# Patient Record
Sex: Female | Born: 1950 | Race: White | Hispanic: No | Smoking: Never smoker
Health system: Southern US, Community
[De-identification: ages and names within clinical notes are randomized; demographics above are authoritative.]

## PROBLEM LIST (undated history)

## (undated) ENCOUNTER — Emergency Department (HOSPITAL_COMMUNITY): Admission: EM | Payer: PPO | Source: Home / Self Care

## (undated) DIAGNOSIS — E662 Morbid (severe) obesity with alveolar hypoventilation: Secondary | ICD-10-CM

## (undated) DIAGNOSIS — I272 Pulmonary hypertension, unspecified: Secondary | ICD-10-CM

## (undated) DIAGNOSIS — I639 Cerebral infarction, unspecified: Secondary | ICD-10-CM

## (undated) DIAGNOSIS — Q782 Osteopetrosis: Secondary | ICD-10-CM

## (undated) DIAGNOSIS — R600 Localized edema: Secondary | ICD-10-CM

## (undated) DIAGNOSIS — G709 Myoneural disorder, unspecified: Secondary | ICD-10-CM

## (undated) DIAGNOSIS — G4730 Sleep apnea, unspecified: Secondary | ICD-10-CM

## (undated) DIAGNOSIS — K219 Gastro-esophageal reflux disease without esophagitis: Secondary | ICD-10-CM

## (undated) DIAGNOSIS — I482 Chronic atrial fibrillation, unspecified: Secondary | ICD-10-CM

## (undated) DIAGNOSIS — I10 Essential (primary) hypertension: Secondary | ICD-10-CM

## (undated) DIAGNOSIS — N950 Postmenopausal bleeding: Secondary | ICD-10-CM

## (undated) DIAGNOSIS — M8430XA Stress fracture, unspecified site, initial encounter for fracture: Secondary | ICD-10-CM

## (undated) DIAGNOSIS — K635 Polyp of colon: Secondary | ICD-10-CM

## (undated) DIAGNOSIS — IMO0001 Reserved for inherently not codable concepts without codable children: Secondary | ICD-10-CM

## (undated) DIAGNOSIS — R29898 Other symptoms and signs involving the musculoskeletal system: Secondary | ICD-10-CM

## (undated) DIAGNOSIS — C801 Malignant (primary) neoplasm, unspecified: Secondary | ICD-10-CM

## (undated) DIAGNOSIS — Z96619 Presence of unspecified artificial shoulder joint: Secondary | ICD-10-CM

## (undated) DIAGNOSIS — M1990 Unspecified osteoarthritis, unspecified site: Secondary | ICD-10-CM

## (undated) DIAGNOSIS — M779 Enthesopathy, unspecified: Secondary | ICD-10-CM

## (undated) DIAGNOSIS — Z96659 Presence of unspecified artificial knee joint: Secondary | ICD-10-CM

## (undated) DIAGNOSIS — E538 Deficiency of other specified B group vitamins: Secondary | ICD-10-CM

## (undated) DIAGNOSIS — Z5189 Encounter for other specified aftercare: Secondary | ICD-10-CM

## (undated) DIAGNOSIS — I4891 Unspecified atrial fibrillation: Secondary | ICD-10-CM

## (undated) DIAGNOSIS — I499 Cardiac arrhythmia, unspecified: Secondary | ICD-10-CM

## (undated) DIAGNOSIS — E039 Hypothyroidism, unspecified: Secondary | ICD-10-CM

## (undated) DIAGNOSIS — K5790 Diverticulosis of intestine, part unspecified, without perforation or abscess without bleeding: Secondary | ICD-10-CM

## (undated) DIAGNOSIS — I119 Hypertensive heart disease without heart failure: Secondary | ICD-10-CM

## (undated) DIAGNOSIS — E785 Hyperlipidemia, unspecified: Secondary | ICD-10-CM

## (undated) DIAGNOSIS — I509 Heart failure, unspecified: Secondary | ICD-10-CM

## (undated) HISTORY — DX: Myoneural disorder, unspecified: G70.9

## (undated) HISTORY — PX: TOTAL KNEE ARTHROPLASTY: SHX125

## (undated) HISTORY — PX: NOSE SURGERY: SHX723

## (undated) HISTORY — DX: Deficiency of other specified B group vitamins: E53.8

## (undated) HISTORY — PX: COLONOSCOPY W/ BIOPSIES AND POLYPECTOMY: SHX1376

## (undated) HISTORY — DX: Polyp of colon: K63.5

## (undated) HISTORY — PX: TONSILLECTOMY: SUR1361

## (undated) HISTORY — DX: Postmenopausal bleeding: N95.0

## (undated) HISTORY — DX: Localized edema: R60.0

## (undated) HISTORY — DX: Enthesopathy, unspecified: M77.9

## (undated) HISTORY — DX: Stress fracture, unspecified site, initial encounter for fracture: M84.30XA

## (undated) HISTORY — DX: Cerebral infarction, unspecified: I63.9

## (undated) HISTORY — DX: Diverticulosis of intestine, part unspecified, without perforation or abscess without bleeding: K57.90

## (undated) HISTORY — DX: Hypothyroidism, unspecified: E03.9

## (undated) HISTORY — PX: DILATION AND CURETTAGE OF UTERUS: SHX78

## (undated) HISTORY — DX: Osteopetrosis: Q78.2

---
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
## 1998-09-08 ENCOUNTER — Other Ambulatory Visit: Admission: RE | Admit: 1998-09-08 | Discharge: 1998-09-08 | Payer: Self-pay | Admitting: Family Medicine

## 2000-01-26 ENCOUNTER — Ambulatory Visit (HOSPITAL_COMMUNITY): Admission: RE | Admit: 2000-01-26 | Discharge: 2000-01-26 | Payer: Self-pay | Admitting: Family Medicine

## 2000-01-26 ENCOUNTER — Encounter: Payer: Self-pay | Admitting: Family Medicine

## 2000-02-17 ENCOUNTER — Encounter: Payer: Self-pay | Admitting: Orthopedic Surgery

## 2000-02-23 ENCOUNTER — Inpatient Hospital Stay (HOSPITAL_COMMUNITY): Admission: RE | Admit: 2000-02-23 | Discharge: 2000-02-27 | Payer: Self-pay | Admitting: Orthopedic Surgery

## 2003-02-21 ENCOUNTER — Ambulatory Visit (HOSPITAL_COMMUNITY): Admission: RE | Admit: 2003-02-21 | Discharge: 2003-02-21 | Payer: Self-pay | Admitting: Gastroenterology

## 2003-02-21 ENCOUNTER — Encounter (INDEPENDENT_AMBULATORY_CARE_PROVIDER_SITE_OTHER): Payer: Self-pay | Admitting: Specialist

## 2003-03-12 ENCOUNTER — Ambulatory Visit (HOSPITAL_COMMUNITY): Admission: RE | Admit: 2003-03-12 | Discharge: 2003-03-12 | Payer: Self-pay | Admitting: Family Medicine

## 2003-03-12 ENCOUNTER — Encounter: Payer: Self-pay | Admitting: Family Medicine

## 2003-03-31 ENCOUNTER — Encounter: Payer: Self-pay | Admitting: Internal Medicine

## 2004-09-05 LAB — HM COLONOSCOPY

## 2004-12-02 ENCOUNTER — Other Ambulatory Visit: Admission: RE | Admit: 2004-12-02 | Discharge: 2004-12-02 | Payer: Self-pay | Admitting: Family Medicine

## 2005-03-04 ENCOUNTER — Ambulatory Visit (HOSPITAL_COMMUNITY): Admission: RE | Admit: 2005-03-04 | Discharge: 2005-03-04 | Payer: Self-pay | Admitting: Family Medicine

## 2006-03-02 ENCOUNTER — Other Ambulatory Visit: Admission: RE | Admit: 2006-03-02 | Discharge: 2006-03-02 | Payer: Self-pay | Admitting: Family Medicine

## 2006-03-05 ENCOUNTER — Encounter: Payer: Self-pay | Admitting: Internal Medicine

## 2006-03-05 LAB — CONVERTED CEMR LAB

## 2006-03-20 ENCOUNTER — Ambulatory Visit (HOSPITAL_COMMUNITY): Admission: RE | Admit: 2006-03-20 | Discharge: 2006-03-20 | Payer: Self-pay | Admitting: Family Medicine

## 2006-04-05 ENCOUNTER — Ambulatory Visit (HOSPITAL_COMMUNITY): Admission: RE | Admit: 2006-04-05 | Discharge: 2006-04-05 | Payer: Self-pay | Admitting: Family Medicine

## 2006-07-31 ENCOUNTER — Inpatient Hospital Stay (HOSPITAL_COMMUNITY): Admission: RE | Admit: 2006-07-31 | Discharge: 2006-08-03 | Payer: Self-pay | Admitting: Orthopedic Surgery

## 2007-01-23 ENCOUNTER — Ambulatory Visit: Payer: Self-pay | Admitting: Internal Medicine

## 2007-02-28 ENCOUNTER — Encounter: Payer: Self-pay | Admitting: Internal Medicine

## 2007-02-28 DIAGNOSIS — E039 Hypothyroidism, unspecified: Secondary | ICD-10-CM | POA: Insufficient documentation

## 2007-04-02 ENCOUNTER — Ambulatory Visit: Payer: Self-pay | Admitting: Internal Medicine

## 2007-04-02 LAB — CONVERTED CEMR LAB
Albumin: 3.5 g/dL (ref 3.5–5.2)
Alkaline Phosphatase: 90 units/L (ref 39–117)
Blood in Urine, dipstick: NEGATIVE
CO2: 31 meq/L (ref 19–32)
Chloride: 103 meq/L (ref 96–112)
Cholesterol: 193 mg/dL (ref 0–200)
Creatinine, Ser: 0.9 mg/dL (ref 0.4–1.2)
GFR calc Af Amer: 83 mL/min
GFR calc non Af Amer: 69 mL/min
HDL: 35.2 mg/dL — ABNORMAL LOW (ref >39.0)
Hemoglobin: 10.2 g/dL — ABNORMAL LOW (ref 12.0–15.0)
Ketones, urine, test strip: NEGATIVE
MCV: 77.2 fL — ABNORMAL LOW (ref 78.0–100.0)
Monocytes Absolute: 0.6 10*3/uL (ref 0.2–0.7)
Monocytes Relative: 7.5 % (ref 3.0–11.0)
Potassium: 4.3 meq/L (ref 3.5–5.1)
Protein, U semiquant: NEGATIVE
RBC: 3.99 M/uL (ref 3.87–5.11)
RDW: 17.5 % — ABNORMAL HIGH (ref 11.5–14.6)
Sodium: 143 meq/L (ref 135–145)
Specific Gravity, Urine: 1.025
Urobilinogen, UA: 0.2
VLDL: 23 mg/dL (ref 0–40)
WBC: 8.1 10*3/uL (ref 4.5–10.5)

## 2007-04-09 ENCOUNTER — Encounter: Payer: Self-pay | Admitting: Internal Medicine

## 2007-04-09 ENCOUNTER — Other Ambulatory Visit: Admission: RE | Admit: 2007-04-09 | Discharge: 2007-04-09 | Payer: Self-pay | Admitting: Internal Medicine

## 2007-04-09 ENCOUNTER — Ambulatory Visit: Payer: Self-pay | Admitting: Internal Medicine

## 2007-04-09 DIAGNOSIS — E785 Hyperlipidemia, unspecified: Secondary | ICD-10-CM | POA: Insufficient documentation

## 2007-04-25 ENCOUNTER — Telehealth: Payer: Self-pay | Admitting: *Deleted

## 2007-04-25 LAB — CONVERTED CEMR LAB
Basophils Absolute: 0 10*3/uL (ref 0.0–0.1)
Basophils Relative: 0.4 % (ref 0.0–1.0)
Eosinophils Absolute: 0.4 10*3/uL (ref 0.0–0.6)
Eosinophils Relative: 3.9 % (ref 0.0–5.0)
Ferritin: 15.4 ng/mL (ref 10.0–291.0)
Folate: 3.9 ng/mL
HCT: 31.6 % — ABNORMAL LOW (ref 36.0–46.0)
Hemoglobin: 10.6 g/dL — ABNORMAL LOW (ref 12.0–15.0)
Lymphocytes Relative: 14.7 % (ref 12.0–46.0)
MCHC: 33.4 g/dL (ref 30.0–36.0)
MCV: 78 fL (ref 78.0–100.0)
Monocytes Absolute: 0.6 10*3/uL (ref 0.2–0.7)
Monocytes Relative: 6.2 % (ref 3.0–11.0)
Neutro Abs: 6.8 10*3/uL (ref 1.4–7.7)
Neutrophils Relative %: 74.8 % (ref 43.0–77.0)
Platelets: 289 10*3/uL (ref 150–400)
RBC: 4.05 M/uL (ref 3.87–5.11)
RDW: 17.3 % — ABNORMAL HIGH (ref 11.5–14.6)
Vitamin B-12: 148 pg/mL — ABNORMAL LOW (ref 211–911)
WBC: 9.1 10*3/uL (ref 4.5–10.5)

## 2007-05-08 ENCOUNTER — Ambulatory Visit: Payer: Self-pay | Admitting: Internal Medicine

## 2007-06-06 ENCOUNTER — Ambulatory Visit: Payer: Self-pay | Admitting: Internal Medicine

## 2007-06-06 LAB — CONVERTED CEMR LAB
AST: 21 units/L (ref 0–37)
Albumin: 3.6 g/dL (ref 3.5–5.2)
BUN: 20 mg/dL (ref 6–23)
Bilirubin, Direct: 0.1 mg/dL (ref 0.0–0.3)
Eosinophils Absolute: 0.2 10*3/uL (ref 0.0–0.6)
Eosinophils Relative: 3.1 % (ref 0.0–5.0)
GFR calc non Af Amer: 69 mL/min
Hgb A1c MFr Bld: 6.3 % — ABNORMAL HIGH (ref 4.6–6.0)
Monocytes Absolute: 0.3 10*3/uL (ref 0.2–0.7)
Monocytes Relative: 3.9 % (ref 3.0–11.0)
Neutro Abs: 5.7 10*3/uL (ref 1.4–7.7)
Neutrophils Relative %: 75.8 % (ref 43.0–77.0)
RBC: 4.15 M/uL (ref 3.87–5.11)
RDW: 19.4 % — ABNORMAL HIGH (ref 11.5–14.6)
Total Bilirubin: 0.6 mg/dL (ref 0.3–1.2)
Total Protein: 6.2 g/dL (ref 6.0–8.3)
Vitamin B-12: 225 pg/mL (ref 211–911)

## 2007-06-13 ENCOUNTER — Telehealth: Payer: Self-pay | Admitting: Family Medicine

## 2007-06-18 ENCOUNTER — Ambulatory Visit: Payer: Self-pay | Admitting: Internal Medicine

## 2007-06-27 ENCOUNTER — Ambulatory Visit: Payer: Self-pay | Admitting: Internal Medicine

## 2007-07-04 ENCOUNTER — Telehealth (INDEPENDENT_AMBULATORY_CARE_PROVIDER_SITE_OTHER): Payer: Self-pay | Admitting: *Deleted

## 2007-07-04 ENCOUNTER — Ambulatory Visit: Payer: Self-pay | Admitting: Internal Medicine

## 2007-07-11 ENCOUNTER — Ambulatory Visit: Payer: Self-pay | Admitting: Internal Medicine

## 2007-07-18 ENCOUNTER — Ambulatory Visit: Payer: Self-pay | Admitting: Internal Medicine

## 2007-08-01 ENCOUNTER — Ambulatory Visit: Payer: Self-pay | Admitting: Internal Medicine

## 2007-08-01 ENCOUNTER — Telehealth: Payer: Self-pay | Admitting: Internal Medicine

## 2007-08-08 ENCOUNTER — Ambulatory Visit: Payer: Self-pay | Admitting: Internal Medicine

## 2007-08-13 ENCOUNTER — Ambulatory Visit: Payer: Self-pay | Admitting: Internal Medicine

## 2007-08-16 LAB — CONVERTED CEMR LAB
ALT: 15 units/L (ref 0–35)
Alkaline Phosphatase: 82 units/L (ref 39–117)
Bilirubin, Direct: 0.2 mg/dL (ref 0.0–0.3)
Total Bilirubin: 0.6 mg/dL (ref 0.3–1.2)
Total Protein: 6.5 g/dL (ref 6.0–8.3)
Vitamin B-12: 644 pg/mL (ref 211–911)

## 2007-08-22 ENCOUNTER — Ambulatory Visit: Payer: Self-pay | Admitting: Internal Medicine

## 2007-08-22 LAB — CONVERTED CEMR LAB: Hemoglobin: 13.4 g/dL

## 2007-09-14 ENCOUNTER — Telehealth: Payer: Self-pay | Admitting: Internal Medicine

## 2007-09-19 ENCOUNTER — Ambulatory Visit: Payer: Self-pay | Admitting: Internal Medicine

## 2007-10-19 ENCOUNTER — Ambulatory Visit: Payer: Self-pay | Admitting: Internal Medicine

## 2007-11-07 ENCOUNTER — Ambulatory Visit: Payer: Self-pay | Admitting: Internal Medicine

## 2007-11-07 DIAGNOSIS — N950 Postmenopausal bleeding: Secondary | ICD-10-CM | POA: Insufficient documentation

## 2007-11-07 LAB — CONVERTED CEMR LAB
Glucose, Urine, Semiquant: NEGATIVE
pH: 5.5

## 2007-11-08 ENCOUNTER — Telehealth: Payer: Self-pay | Admitting: Internal Medicine

## 2007-11-08 ENCOUNTER — Encounter: Payer: Self-pay | Admitting: Internal Medicine

## 2007-11-13 ENCOUNTER — Other Ambulatory Visit: Admission: RE | Admit: 2007-11-13 | Discharge: 2007-11-13 | Payer: Self-pay | Admitting: Obstetrics & Gynecology

## 2007-11-14 ENCOUNTER — Ambulatory Visit: Payer: Self-pay | Admitting: Internal Medicine

## 2007-11-20 ENCOUNTER — Encounter: Payer: Self-pay | Admitting: Internal Medicine

## 2007-11-28 ENCOUNTER — Ambulatory Visit: Payer: Self-pay | Admitting: Internal Medicine

## 2007-12-02 LAB — CONVERTED CEMR LAB
Basophils Absolute: 0.1 10*3/uL (ref 0.0–0.1)
Eosinophils Relative: 3.5 % (ref 0.0–5.0)
HCT: 37.6 % (ref 36.0–46.0)
Hemoglobin: 12 g/dL (ref 12.0–15.0)
Hgb A1c MFr Bld: 6.2 % — ABNORMAL HIGH (ref 4.6–6.0)
MCHC: 32 g/dL (ref 30.0–36.0)
Monocytes Relative: 6.5 % (ref 3.0–11.0)
Neutrophils Relative %: 72.4 % (ref 43.0–77.0)
RBC: 4.14 M/uL (ref 3.87–5.11)
Vitamin B-12: 522 pg/mL (ref 211–911)
WBC: 7.2 10*3/uL (ref 4.5–10.5)

## 2007-12-10 ENCOUNTER — Ambulatory Visit: Payer: Self-pay | Admitting: Internal Medicine

## 2007-12-10 LAB — CONVERTED CEMR LAB
Cholesterol, target level: 200 mg/dL
HDL goal, serum: 40 mg/dL
LDL Goal: 130 mg/dL

## 2008-01-14 ENCOUNTER — Ambulatory Visit: Payer: Self-pay | Admitting: Internal Medicine

## 2008-02-11 ENCOUNTER — Encounter: Admission: RE | Admit: 2008-02-11 | Discharge: 2008-02-11 | Payer: Self-pay | Admitting: Orthopedic Surgery

## 2008-02-15 ENCOUNTER — Encounter: Admission: RE | Admit: 2008-02-15 | Discharge: 2008-02-15 | Payer: Self-pay | Admitting: Orthopedic Surgery

## 2008-02-19 ENCOUNTER — Telehealth: Payer: Self-pay | Admitting: Internal Medicine

## 2008-02-27 ENCOUNTER — Ambulatory Visit (HOSPITAL_COMMUNITY): Admission: RE | Admit: 2008-02-27 | Discharge: 2008-02-27 | Payer: Self-pay | Admitting: Internal Medicine

## 2008-03-02 ENCOUNTER — Emergency Department (HOSPITAL_COMMUNITY): Admission: EM | Admit: 2008-03-02 | Discharge: 2008-03-02 | Payer: Self-pay | Admitting: Family Medicine

## 2008-03-03 ENCOUNTER — Ambulatory Visit: Payer: Self-pay | Admitting: Internal Medicine

## 2008-03-31 ENCOUNTER — Encounter: Payer: Self-pay | Admitting: Obstetrics & Gynecology

## 2008-03-31 ENCOUNTER — Ambulatory Visit: Payer: Self-pay | Admitting: Internal Medicine

## 2008-03-31 ENCOUNTER — Ambulatory Visit (HOSPITAL_COMMUNITY): Admission: RE | Admit: 2008-03-31 | Discharge: 2008-03-31 | Payer: Self-pay | Admitting: Obstetrics & Gynecology

## 2008-04-14 ENCOUNTER — Telehealth: Payer: Self-pay | Admitting: Internal Medicine

## 2008-04-18 ENCOUNTER — Ambulatory Visit: Payer: Self-pay | Admitting: Internal Medicine

## 2008-04-18 LAB — CONVERTED CEMR LAB
ALT: 16 units/L (ref 0–35)
BUN: 15 mg/dL (ref 6–23)
Basophils Absolute: 0 10*3/uL (ref 0.0–0.1)
Bilirubin Urine: NEGATIVE
Bilirubin, Direct: 0.1 mg/dL (ref 0.0–0.3)
Blood in Urine, dipstick: NEGATIVE
Calcium: 9.1 mg/dL (ref 8.4–10.5)
Chloride: 102 meq/L (ref 96–112)
Creatinine, Ser: 1 mg/dL (ref 0.4–1.2)
GFR calc Af Amer: 73 mL/min
GFR calc non Af Amer: 61 mL/min
HCT: 37.8 % (ref 36.0–46.0)
Ketones, urine, test strip: NEGATIVE
LDL Cholesterol: 123 mg/dL — ABNORMAL HIGH (ref 0–99)
MCHC: 33.4 g/dL (ref 30.0–36.0)
MCV: 91.6 fL (ref 78.0–100.0)
Neutrophils Relative %: 74.4 % (ref 43.0–77.0)
Nitrite: NEGATIVE
Platelets: 220 10*3/uL (ref 150–400)
Potassium: 4 meq/L (ref 3.5–5.1)
Protein, U semiquant: NEGATIVE
RBC: 4.13 M/uL (ref 3.87–5.11)
RDW: 13.9 % (ref 11.5–14.6)
Sodium: 142 meq/L (ref 135–145)
Specific Gravity, Urine: 1.02
TSH: 2.18 microintl units/mL (ref 0.35–5.50)
Total CHOL/HDL Ratio: 5.1
Triglycerides: 182 mg/dL — ABNORMAL HIGH (ref 0–149)
WBC: 9.2 10*3/uL (ref 4.5–10.5)

## 2008-04-23 ENCOUNTER — Ambulatory Visit (HOSPITAL_COMMUNITY): Admission: RE | Admit: 2008-04-23 | Discharge: 2008-04-23 | Payer: Self-pay | Admitting: Orthopedic Surgery

## 2008-05-01 ENCOUNTER — Telehealth: Payer: Self-pay | Admitting: Internal Medicine

## 2008-05-05 ENCOUNTER — Ambulatory Visit: Payer: Self-pay | Admitting: Internal Medicine

## 2008-06-02 ENCOUNTER — Ambulatory Visit: Payer: Self-pay | Admitting: Internal Medicine

## 2008-06-28 ENCOUNTER — Encounter: Payer: Self-pay | Admitting: Internal Medicine

## 2008-06-30 ENCOUNTER — Ambulatory Visit: Payer: Self-pay | Admitting: Internal Medicine

## 2008-07-07 ENCOUNTER — Ambulatory Visit: Payer: Self-pay | Admitting: Internal Medicine

## 2008-07-07 ENCOUNTER — Telehealth: Payer: Self-pay | Admitting: Internal Medicine

## 2008-07-07 DIAGNOSIS — N390 Urinary tract infection, site not specified: Secondary | ICD-10-CM | POA: Insufficient documentation

## 2008-07-07 LAB — CONVERTED CEMR LAB
Specific Gravity, Urine: <1.005
Urobilinogen, UA: >=8
pH: 5

## 2008-07-08 ENCOUNTER — Encounter: Payer: Self-pay | Admitting: Internal Medicine

## 2008-07-15 ENCOUNTER — Telehealth: Payer: Self-pay | Admitting: Internal Medicine

## 2008-07-21 ENCOUNTER — Inpatient Hospital Stay (HOSPITAL_COMMUNITY): Admission: RE | Admit: 2008-07-21 | Discharge: 2008-07-24 | Payer: Self-pay | Admitting: Orthopedic Surgery

## 2008-09-08 ENCOUNTER — Ambulatory Visit: Payer: Self-pay | Admitting: Internal Medicine

## 2008-10-15 ENCOUNTER — Ambulatory Visit: Payer: Self-pay | Admitting: Internal Medicine

## 2008-10-15 DIAGNOSIS — J069 Acute upper respiratory infection, unspecified: Secondary | ICD-10-CM | POA: Insufficient documentation

## 2008-10-21 ENCOUNTER — Telehealth: Payer: Self-pay | Admitting: Internal Medicine

## 2008-11-10 ENCOUNTER — Telehealth: Payer: Self-pay | Admitting: *Deleted

## 2008-11-13 ENCOUNTER — Telehealth: Payer: Self-pay | Admitting: Internal Medicine

## 2008-11-17 ENCOUNTER — Ambulatory Visit: Payer: Self-pay | Admitting: Internal Medicine

## 2008-11-28 ENCOUNTER — Encounter: Admission: RE | Admit: 2008-11-28 | Discharge: 2008-11-28 | Payer: Self-pay | Admitting: Orthopedic Surgery

## 2008-12-03 ENCOUNTER — Telehealth: Payer: Self-pay | Admitting: Internal Medicine

## 2008-12-15 ENCOUNTER — Other Ambulatory Visit: Admission: RE | Admit: 2008-12-15 | Discharge: 2008-12-15 | Payer: Self-pay | Admitting: Obstetrics & Gynecology

## 2008-12-15 ENCOUNTER — Encounter: Payer: Self-pay | Admitting: Internal Medicine

## 2008-12-18 ENCOUNTER — Telehealth: Payer: Self-pay | Admitting: Internal Medicine

## 2009-01-26 ENCOUNTER — Ambulatory Visit: Payer: Self-pay | Admitting: Internal Medicine

## 2009-02-16 ENCOUNTER — Telehealth: Payer: Self-pay | Admitting: Family Medicine

## 2009-02-23 ENCOUNTER — Telehealth: Payer: Self-pay | Admitting: *Deleted

## 2009-02-24 ENCOUNTER — Telehealth: Payer: Self-pay | Admitting: *Deleted

## 2009-03-17 ENCOUNTER — Telehealth: Payer: Self-pay | Admitting: *Deleted

## 2009-04-15 ENCOUNTER — Telehealth: Payer: Self-pay | Admitting: *Deleted

## 2009-05-04 ENCOUNTER — Ambulatory Visit: Payer: Self-pay | Admitting: Internal Medicine

## 2009-05-04 LAB — CONVERTED CEMR LAB
ALT: 14 units/L (ref 0–35)
AST: 22 units/L (ref 0–37)
Albumin: 3.7 g/dL (ref 3.5–5.2)
Alkaline Phosphatase: 77 units/L (ref 39–117)
BUN: 26 mg/dL — ABNORMAL HIGH (ref 6–23)
Basophils Absolute: 0.1 10*3/uL (ref 0.0–0.1)
Basophils Relative: 0.9 % (ref 0.0–3.0)
CO2: 32 meq/L (ref 19–32)
Calcium: 9.1 mg/dL (ref 8.4–10.5)
Chloride: 102 meq/L (ref 96–112)
Creatinine, Ser: 1 mg/dL (ref 0.4–1.2)
Eosinophils Absolute: 0.2 10*3/uL (ref 0.0–0.7)
HCT: 34.7 % — ABNORMAL LOW (ref 36.0–46.0)
HDL: 39 mg/dL — ABNORMAL LOW (ref >39.00)
Lymphocytes Relative: 15.8 % (ref 12.0–46.0)
Lymphs Abs: 1.2 10*3/uL (ref 0.7–4.0)
MCV: 85.6 fL (ref 78.0–100.0)
Neutro Abs: 5.4 10*3/uL (ref 1.4–7.7)
Neutrophils Relative %: 74.2 % (ref 43.0–77.0)
Platelets: 231 10*3/uL (ref 150.0–400.0)
Potassium: 4 meq/L (ref 3.5–5.1)
Sodium: 140 meq/L (ref 135–145)
TSH: 1.96 microintl units/mL (ref 0.35–5.50)
Total Protein: 7 g/dL (ref 6.0–8.3)
Triglycerides: 154 mg/dL — ABNORMAL HIGH (ref 0.0–149.0)
VLDL: 30.8 mg/dL (ref 0.0–40.0)
WBC: 7.3 10*3/uL (ref 4.5–10.5)

## 2009-05-12 ENCOUNTER — Ambulatory Visit: Payer: Self-pay | Admitting: Internal Medicine

## 2009-06-22 ENCOUNTER — Telehealth: Payer: Self-pay | Admitting: Internal Medicine

## 2009-06-24 ENCOUNTER — Telehealth: Payer: Self-pay | Admitting: Internal Medicine

## 2009-06-24 ENCOUNTER — Ambulatory Visit: Payer: Self-pay | Admitting: Internal Medicine

## 2009-07-09 ENCOUNTER — Ambulatory Visit: Payer: Self-pay | Admitting: Internal Medicine

## 2009-07-09 LAB — CONVERTED CEMR LAB
Basophils Absolute: 0.1 10*3/uL (ref 0.0–0.1)
CO2: 32 meq/L (ref 19–32)
Chloride: 99 meq/L (ref 96–112)
Creatinine, Ser: 0.9 mg/dL (ref 0.4–1.2)
Hemoglobin: 12.1 g/dL (ref 12.0–15.0)
Hgb A1c MFr Bld: 6.2 % (ref 4.6–6.5)
Lymphocytes Relative: 13.8 % (ref 12.0–46.0)
Lymphs Abs: 1 10*3/uL (ref 0.7–4.0)
MCHC: 33.9 g/dL (ref 30.0–36.0)
MCV: 89.2 fL (ref 78.0–100.0)
Microalb, Ur: 0.2 mg/dL (ref 0.0–1.9)
Monocytes Absolute: 0.5 10*3/uL (ref 0.1–1.0)
Monocytes Relative: 6.3 % (ref 3.0–12.0)
Platelets: 201 10*3/uL (ref 150.0–400.0)
Saturation Ratios: 13 % — ABNORMAL LOW (ref 20.0–50.0)
Sodium: 141 meq/L (ref 135–145)

## 2009-07-16 ENCOUNTER — Ambulatory Visit: Payer: Self-pay | Admitting: Internal Medicine

## 2009-09-22 ENCOUNTER — Telehealth: Payer: Self-pay | Admitting: *Deleted

## 2009-10-07 ENCOUNTER — Telehealth: Payer: Self-pay | Admitting: Internal Medicine

## 2009-10-22 ENCOUNTER — Ambulatory Visit: Payer: Self-pay | Admitting: Internal Medicine

## 2009-10-30 ENCOUNTER — Ambulatory Visit (HOSPITAL_COMMUNITY): Admission: RE | Admit: 2009-10-30 | Discharge: 2009-10-30 | Payer: Self-pay | Admitting: Obstetrics & Gynecology

## 2009-10-30 LAB — HM MAMMOGRAPHY

## 2009-11-05 ENCOUNTER — Encounter: Payer: Self-pay | Admitting: Internal Medicine

## 2009-12-02 ENCOUNTER — Inpatient Hospital Stay (HOSPITAL_COMMUNITY): Admission: RE | Admit: 2009-12-02 | Discharge: 2009-12-05 | Payer: Self-pay | Admitting: Orthopedic Surgery

## 2010-03-09 ENCOUNTER — Ambulatory Visit: Payer: Self-pay | Admitting: Internal Medicine

## 2010-04-22 ENCOUNTER — Ambulatory Visit: Payer: Self-pay | Admitting: Internal Medicine

## 2010-05-17 ENCOUNTER — Ambulatory Visit: Payer: Self-pay | Admitting: Internal Medicine

## 2010-05-17 DIAGNOSIS — M1990 Unspecified osteoarthritis, unspecified site: Secondary | ICD-10-CM | POA: Insufficient documentation

## 2010-05-26 ENCOUNTER — Telehealth: Payer: Self-pay | Admitting: Internal Medicine

## 2010-06-05 HISTORY — PX: OTHER SURGICAL HISTORY: SHX169

## 2010-06-11 ENCOUNTER — Telehealth: Payer: Self-pay | Admitting: *Deleted

## 2010-06-18 ENCOUNTER — Inpatient Hospital Stay (HOSPITAL_COMMUNITY): Admission: RE | Admit: 2010-06-18 | Discharge: 2010-06-19 | Payer: Self-pay | Admitting: Orthopedic Surgery

## 2010-06-30 ENCOUNTER — Ambulatory Visit: Payer: Self-pay | Admitting: Internal Medicine

## 2010-06-30 LAB — CONVERTED CEMR LAB
ALT: 13 units/L (ref 0–35)
AST: 19 units/L (ref 0–37)
Alkaline Phosphatase: 89 units/L (ref 39–117)
BUN: 19 mg/dL (ref 6–23)
Basophils Absolute: 0 10*3/uL (ref 0.0–0.1)
Bilirubin, Direct: 0.1 mg/dL (ref 0.0–0.3)
Blood in Urine, dipstick: NEGATIVE
Calcium: 8.8 mg/dL (ref 8.4–10.5)
Chloride: 98 meq/L (ref 96–112)
Creatinine, Ser: 0.9 mg/dL (ref 0.4–1.2)
GFR calc non Af Amer: 68.56 mL/min (ref >60)
HCT: 32.3 % — ABNORMAL LOW (ref 36.0–46.0)
Hemoglobin: 10.7 g/dL — ABNORMAL LOW (ref 12.0–15.0)
Ketones, urine, test strip: NEGATIVE
Lymphs Abs: 1.1 10*3/uL (ref 0.7–4.0)
Monocytes Absolute: 0.5 10*3/uL (ref 0.1–1.0)
Monocytes Relative: 5.8 % (ref 3.0–12.0)
Nitrite: NEGATIVE
RBC: 3.79 M/uL — ABNORMAL LOW (ref 3.87–5.11)
RDW: 17.2 % — ABNORMAL HIGH (ref 11.5–14.6)
Sodium: 136 meq/L (ref 135–145)
Specific Gravity, Urine: 1.02
Total CHOL/HDL Ratio: 4
Urobilinogen, UA: 0.2
WBC: 8.7 10*3/uL (ref 4.5–10.5)

## 2010-07-09 ENCOUNTER — Ambulatory Visit: Payer: Self-pay | Admitting: Internal Medicine

## 2010-07-16 ENCOUNTER — Encounter: Payer: Self-pay | Admitting: Internal Medicine

## 2010-09-10 ENCOUNTER — Telehealth: Payer: Self-pay | Admitting: *Deleted

## 2010-09-28 ENCOUNTER — Other Ambulatory Visit: Payer: Self-pay | Admitting: Internal Medicine

## 2010-09-28 ENCOUNTER — Ambulatory Visit
Admission: RE | Admit: 2010-09-28 | Discharge: 2010-09-28 | Payer: Self-pay | Source: Home / Self Care | Attending: Internal Medicine | Admitting: Internal Medicine

## 2010-10-05 ENCOUNTER — Ambulatory Visit
Admission: RE | Admit: 2010-10-05 | Discharge: 2010-10-05 | Payer: Self-pay | Source: Home / Self Care | Attending: Internal Medicine | Admitting: Internal Medicine

## 2010-10-05 ENCOUNTER — Other Ambulatory Visit: Payer: Self-pay | Admitting: Internal Medicine

## 2010-10-05 LAB — FERRITIN: Ferritin: 19 ng/mL (ref 10.0–291.0)

## 2010-10-05 LAB — CBC WITH DIFFERENTIAL/PLATELET
Basophils Absolute: 0 10*3/uL (ref 0.0–0.1)
Basophils Relative: 0.4 % (ref 0.0–3.0)
HCT: 31.3 % — ABNORMAL LOW (ref 36.0–46.0)
Hemoglobin: 10 g/dL — ABNORMAL LOW (ref 12.0–15.0)
Lymphs Abs: 1.1 10*3/uL (ref 0.7–4.0)
Monocytes Relative: 5.8 % (ref 3.0–12.0)
Neutro Abs: 6.5 10*3/uL (ref 1.4–7.7)
RDW: 20.3 % — ABNORMAL HIGH (ref 11.5–14.6)

## 2010-10-05 LAB — VITAMIN B12: Vitamin B-12: 296 pg/mL (ref 211–911)

## 2010-10-05 LAB — HEMOGLOBIN A1C: Hgb A1c MFr Bld: 6.8 % — ABNORMAL HIGH (ref 4.6–6.5)

## 2010-10-07 NOTE — Progress Notes (Signed)
Summary[facility]  Phone Note Call from Patient   Summary of Call: Renewal LisinoprillHCTZ & Synthroid not received by [facility].  Please resend.   Initial call taken by: Shelbie Hutching, RN,  May 26, 2010 9:26 AM    Prescriptions: SYNTHROID 75 MCG TABS (LEVOTHYROXINE SODIUM) Take 1 tablet by mouth once a day  #90 x 0   Entered by:   Shelbie Hutching, RN   Authorized by:   [name] MD   Signed by:   Shelbie Hutching, RN on 05/26/2010   Method used:   Electronically to        [facility] (mail-order)             , [place]         Ph: [phone]       Fax: [phone]   RxID:   [id number] LISINOPRIL-HYDROCHLOROTHIAZIDE 20-25 MG TABS (LISINOPRIL-HYDROCHLOROTHIAZIDE) 1 by mouth once daily  #90 x 0   Entered by:   Shelbie Hutching, RN   Authorized by:   [name] MD   Signed by:   Shelbie Hutching, RN on 05/26/2010   Method used:   Electronically to        [facility] (mail-order)             , [place]         Ph: [phone]       Fax: [phone]   RxID:   [id number]

## 2010-10-07 NOTE — Assessment & Plan Note (Signed)
Summary: B12 INJ // RS  Nurse Visit   Allergies: 1)  Benadryl (Diphenhydramine Hcl)  Medication Administration  Injection # 1:    Medication: Vit B12 1000 mcg    Diagnosis: ANEMIA, VITAMIN B12 DEFICIENCY NEC (ICD-281.1)    Route: IM    Site: R deltoid    Exp Date: 12/05/2011    Lot #: [id number]    Mfr: [place]    Patient tolerated injection without complications    Given by: Sherron Monday, [place] ([place]) (April 22, 2010 8:33 AM)  Orders Added: 1)  Vit B12 1000 mcg [J3420] 2)  Admin of Therapeutic Inj  intramuscular or subcutaneous [45809]

## 2010-10-07 NOTE — Progress Notes (Signed)
Summary: mailorder rx  Phone Note Refill Request Call back at Home Phone [phone]   Refills Requested: Medication #1:  ATENOLOL 50 MG TABS Take 1 tablet by mouth once a day  Medication #2:  LISINOPRIL-HYDROCHLOROTHIAZIDE 20-25 MG TABS 1 by mouth once daily  Medication #3:  SYNTHROID 75 MCG TABS Take 1 tablet by mouth once a day [facility] with 3 refills  Initial call taken by: Glo Herring,  September 10, 2010 9:22 AM  Follow-up for Phone Call         free T4, free t3   and  ipth  Rx sent for 90 days only. Follow-up by: Sherron Monday, CMA Deborra Medina),  September 10, 2010 9:31 AM  Additional Follow-up for Phone Call Additional follow up Details #1::        Pt aware of this and appt made. Additional Follow-up by: Sherron Monday, CMA (AAMA),  September 10, 2010 1:29 PM    Prescriptions: SYNTHROID 75 MCG TABS (LEVOTHYROXINE SODIUM) Take 1 tablet by mouth once a day  #90 x 0   Entered by:   Sherron Monday, CMA (AAMA)   Authorized by:   [name] MD   Signed by:   Sherron Monday, CMA (AAMA) on 09/10/2010   Method used:   Faxed to ...       [facility] ([facility]order)             , [place]         Ph: [phone]       Fax: [phone]   RxID:   [id number] LISINOPRIL-HYDROCHLOROTHIAZIDE 20-25 MG TABS (LISINOPRIL-HYDROCHLOROTHIAZIDE) 1 by mouth once daily  #90 x 0   Entered by:   Sherron Monday, CMA (AAMA)   Authorized by:   [name] MD   Signed by:   Sherron Monday, CMA (AAMA) on 09/10/2010   Method used:   Faxed to ...       [facility] (mail-order)             , [place]         Ph: [phone]       Fax: [phone]   RxID:   [id number] ATENOLOL 50 MG TABS (ATENOLOL) Take 1 tablet by mouth once a day  #90 x 0   Entered by:   Sherron Monday, CMA (AAMA)   Authorized by:   [name] MD   Signed by:   Sherron Monday, CMA (AAMA) on 09/10/2010   Method used:   Faxed to ...       [facility])             , [place]         Ph: [phone]       Fax: [phone]   RxID:   [id number]

## 2010-10-07 NOTE — Assessment & Plan Note (Signed)
Summary: CPX // RS   Vital Signs:  Patient profile:   60 year old female Menstrual status:  postmenopausal Height:      61.5 inches Weight:      326 pounds BMI:     60.82 Pulse rate:   78 / minute BP sitting:   160 / 80  (left arm) Cuff size:   large  Vitals Entered By: [name], CMA (AAMA) (July 09, 2010 8:34 AM) CC: CPX without pap- Pt has a gyn   History of Present Illness: [name]  comes in today  for preventive visit  Since last visit she has had her  right shoulder surgery  and is now 2 weeks post op and  and doing well with normal activiy so far with arm.   Back at work.  A bit tired but  thinks its from the  anesthesia and inactivity . Bp has been good at surgery.   and   no missed meds  Since the surgery  Taking iron every other day .    hg was low at surgery . Due for b12 shots. today No bleeding except had episode of vaginal  bleeding followed by Stacy [name] .     Preventive Care Screening  Pap Smear:    Date:  12/04/2009    Results:  normal   Prior Values:    Pap Smear:  normal (12/04/2008)    Mammogram:  ASSESSMENT: Negative - BI-RADS 1^MM DIGITAL SCREENING (10/30/2009)    Colonoscopy:  Done (09/05/2004)    Last Tetanus Booster:  Historical (09/05/2001)   Preventive Screening-Counseling & Management  Alcohol-Tobacco     Alcohol drinks/day: <1     Smoking Status: never  Caffeine-Diet-Exercise     Caffeine use/day: <1     Does Patient Exercise: yes     Type of exercise: bike     Times/week: 2  Hep-HIV-STD-Contraception     Dental Visit-last 6 months yes     Sun Exposure-Excessive: no  Safety-Violence-Falls     Seat Belt Use: yes     Firearms in the Home: no firearms in the home     Smoke Detectors: yes     Violence in the Home: no risk noted     Sexual Abuse: no  Current Medications (verified): 1)  Atenolol 50 Mg Tabs (Atenolol) .... Take 1 Tablet By Mouth Once A Day 2)  Hydrocodone-Acetaminophen 7.5-325 Mg Tabs  (Hydrocodone-Acetaminophen) .... Take As Directed 3)  Lisinopril-Hydrochlorothiazide 20-25 Mg Tabs (Lisinopril-Hydrochlorothiazide) .Marland Kitchen.. 1 By Mouth Once Daily 4)  Synthroid 75 Mcg Tabs (Levothyroxine Sodium) .... Take 1 Tablet By Mouth Once A Day 5)  Cyanocobalamin 1000 Mcg/ml Inj Soln (Cyanocobalamin) .Marland Kitchen.. 1 Cc  Q 4-6 Weeks 6)  Vitamin D 50000 Unit  Caps (Ergocalciferol) .Marland Kitchen.. 1 By Mouth Twice A Week. 7)  Cymbalta 30 Mg Cpep (Duloxetine Hcl) .Marland Kitchen.. 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      Review of Systems       The patient complains of dyspnea on exertion.  The patient denies anorexia, fever, weight loss, vision loss, decreased hearing, hoarseness, chest pain, syncope, prolonged cough, melena, hematochezia, severe indigestion/heartburn, transient blindness, abnormal bleeding, enlarged lymph nodes, and angioedema.         DOE  no change  felt from pain and  size.    had episode of vaginal bleeding and [name]  evaluated  .   post nsa;l drainage in am  claritin no help .  ? if flonase helped in the past . No numbness  weakness or falling.   Physical Exam  General:  Well-developed,well-nourished,in no acute distress; alert,appropriate and cooperative throughout examination Head:  normocephalic and atraumatic.   Eyes:  vision grossly intact.   Ears:  R ear normal, L ear normal, and no external deformities.   Nose:  no external deformity, no external erythema, and no nasal discharge.   Mouth:  good dentition and pharynx pink and moist.   Neck:  No deformities, masses, or tenderness noted. Breasts:  No mass, nodules, thickening, tenderness, bulging, retraction, inflamation, nipple discharge or skin changes noted.   Lungs:  Normal respiratory effort, chest expands symmetrically. Lungs are clear to auscultation, no crackles or wheezes. Heart:  normal rate, regular rhythm, no gallop, no rub, no JVD, and no lifts.  soft sem only at lusb no radiation to neck or elsewhere nl carotid pulses and no bruits Abdomen:  Bowel sounds positive,abdomen soft and non-tender without masses, organomegaly or noted. Msk:  healing scar right hsoulder good rom  no redness scars on both knees  Pulses:  pulses intact without delay   Extremities:  trace left pedal edema, 1+ left pedal edema, trace right pedal edema, and 1+ right pedal edema.  some vv no ulcers  Neurologic:  alert & oriented X3 and strength normal in all extremities.   Pt is A&Ox3,affect,speech,memory,attention,&motor skills appear intact.  Skin:  turgor normal, color normal, no petechiae, and no purpura.   Cervical Nodes:  No lymphadenopathy noted Axillary Nodes:  No palpable lymphadenopathy Inguinal Nodes:  No significant adenopathy Psych:  Normal eye contact, appropriate affect. Cognition appears normal.    Impression & Recommendations:  Problem # 1:  PREVENTIVE HEALTH CARE (ICD-V70.0) counseled    Problem # 2:  ANEMIA, VITAMIN B12 DEFICIENCY NEC (ICD-281.1) b12 but may have iron defic also  post op and hx of same  Her updated medication list for this  problem includes:    Cyanocobalamin 1000 Mcg/ml Inj Soln (Cyanocobalamin) .Marland Kitchen... 1 cc  q 4-6 weeks  Orders: Vit B12 1000 mcg (J3420) Admin of Therapeutic Inj  intramuscular or subcutaneous (30160)  Problem # 3:  HYPERLIPIDEMIA (ICD-272.4)  Labs Reviewed: SGOT: 19 (06/30/2010)   SGPT: 13 (06/30/2010)  Lipid  Goals: Chol Goal: 200 (12/10/2007)   HDL Goal: 40 (12/10/2007)   LDL Goal: 130 (12/10/2007)   TG Goal: 150 (12/10/2007)  Prior 10 Yr Risk Heart Disease: 11 % (07/16/2009)   HDL:50.30 (06/30/2010), 39.00 (05/04/2009)  LDL:116 (05/04/2009), 123 (04/18/2008)  Chol:221 (06/30/2010), 186 (05/04/2009)  Trig:165.0 (06/30/2010), 154.0 (05/04/2009)  Problem # 4:  FASTING HYPERGLYCEMIA (ICD-790.29)  fbs 110  at risk counseled   Labs Reviewed: Creat: 0.9 (06/30/2010)     Problem # 5:  HYPERTENSION (ICD-401.9) up today  but has been good feels  an outlier reading    will recheck at home  Her updated medication list for this problem includes:    Atenolol 50 Mg Tabs (Atenolol) .Marland Kitchen... Take 1 tablet by mouth once a day    Lisinopril-hydrochlorothiazide 20-25 Mg Tabs (Lisinopril-hydrochlorothiazide) .Marland Kitchen... 1 by mouth once daily  Problem # 6:  MORBID OBESITY (ICD-278.01) Assessment: Deteriorated some weight gain since last visit    counseled   poss weight watcher or other intervention  Problem # 7:  ARTHRITIS, RIGHT SHOULDER (ICD-716.91) post op doing well .   Problem # 8:  POSTNASAL DRIP (ICD-784.91) poss allergic can try allergra andflonase    Problem # 9:  ANEMIA (ICD-285.9)  prob combo  iron and b12      needss follow up   Problem # 10:  HYPOTHYROIDISM (ICD-244.9)  Her updated medication list for this problem includes:    Synthroid 75 Mcg Tabs (Levothyroxine sodium) .Marland Kitchen... Take 1 tablet by mouth once a day  Labs Reviewed: TSH: 1.62 (06/30/2010)    HgBA1c: 6.2 (07/09/2009) Chol: 221 (06/30/2010)   HDL: 50.30 (06/30/2010)   LDL: 116 (05/04/2009)   TG: 165.0 (06/30/2010)  Complete  Medication List: 1)  Atenolol 50 Mg Tabs (Atenolol) .... Take 1 tablet by mouth once a day 2)  Hydrocodone-acetaminophen 7.5-325 Mg Tabs (Hydrocodone-acetaminophen) .... Take as directed 3)  Lisinopril-hydrochlorothiazide 20-25 Mg Tabs (Lisinopril-hydrochlorothiazide) .Marland Kitchen.. 1 by mouth once daily 4)  Synthroid 75 Mcg Tabs (Levothyroxine sodium) .... Take 1 tablet by mouth once a day 5)  Cyanocobalamin 1000 Mcg/ml Inj Soln (Cyanocobalamin) .Marland Kitchen.. 1 cc  q 4-6 weeks 6)  Vitamin D 50000 Unit Caps (Ergocalciferol) .Marland Kitchen.. 1 by mouth twice a week. 7)  Cymbalta 30 Mg Cpep (Duloxetine hcl) .Marland Kitchen.. 1 by mouth once daily 8)  Omega 3 340 Mg Cpdr (Omega-3 fatty acids) 9)  Aspirin 81 Mg Tabs (Aspirin) 10)  Fluticasone Propionate 50 Mcg/act Susp (Fluticasone propionate) .... 2 sprays each nostril once daily  Patient Instructions: 1)  take iron every day   2)  limit sugars sweets  refined carbs 3)  losing weight will hlep most  pf your medical problems  4)  Monitor your BP to make sure  it is in control . call if not. 5)  Ok to try flonase for drainage . 6)  Recheck cbc, IBC B12 ferritin   Hg a1c in 2-3 months and then ROV  . Dx anemia , elevated sugars  Prescriptions: SYNTHROID 75 MCG TABS (LEVOTHYROXINE SODIUM) Take 1 tablet by mouth once a day  #30 x 1   Entered and Authorized by:   [name] MD   Signed by:   [name] MD on 07/09/2010   Method used:   Electronically to        [facility]  [phone]* (retail)       [address] [place], [place]  [address]  Ph: [phone] or [phone]       Fax: [phone]   RxID:   [id number] LISINOPRIL-HYDROCHLOROTHIAZIDE 20-25 MG TABS (LISINOPRIL-HYDROCHLOROTHIAZIDE) 1 by mouth once daily  #30 x 1   Entered and Authorized by:   [name] MD   Signed by:   [name] MD on 07/09/2010   Method used:   Electronically to        [facility]  [phone]* (retail)       [place], [place]  [address]        Ph: [phone] or [phone]       Fax: [phone]   RxID:   [id number] ATENOLOL 50 MG TABS (ATENOLOL) Take 1 tablet by mouth once a day  #30 x 1   Entered and Authorized by:   [name] MD   Signed by:   [name] MD on 07/09/2010   Method used:   Electronically to        [facility]  [phone]* (retail)       [place], [place]  [address]       Ph: [phone] or [phone]       Fax: [phone]   RxID:   [id number] SYNTHROID 75 MCG TABS (LEVOTHYROXINE SODIUM) Take 1 tablet by mouth once a day  #30 x 0   Entered and Authorized by:   [name] MD   Signed by:   [name] MD on 07/09/2010   Method used:   Electronically to        [facility]* (mail-order)             , [place]         Ph: [phone]       Fax: [phone]   RxID:   [id number] LISINOPRIL-HYDROCHLOROTHIAZIDE 20-25 MG TABS (LISINOPRIL-HYDROCHLOROTHIAZIDE) 1 by mouth once daily  #30 x 0   Entered and Authorized by:   [name] MD   Signed by:   [name] MD on 07/09/2010   Method used:   Electronically to        [facility]* (mail-order)             , [place]         Ph: [phone]       Fax: [phone]   RxID:   [id number] ATENOLOL 50 MG TABS (ATENOLOL) Take 1 tablet by mouth once a day  #90 x 0   Entered and Authorized by:   [name] MD   Signed by:   [name] MD on 07/09/2010   Method used:   Electronically to        [facility]* (mail-order)             , [place]         Ph: [phone]       Fax: [phone]   RxID:   [id number] FLUTICASONE PROPIONATE 50 MCG/ACT SUSP (FLUTICASONE PROPIONATE) 2 sprays each nostril once daily  #1 x 1   Entered and Authorized by:   [name] MD   Signed by:   [name] MD on 07/09/2010   Method used:   Electronically to        [facility]  [phone]* (retail)       [address] [place], [place]  [address]  Ph: [phone] or [phone]       Fax:  [phone]   RxID:   [id number]    Medication Administration  Injection # 1:    Medication: Vit B12 1000 mcg    Diagnosis: ANEMIA, VITAMIN B12 DEFICIENCY NEC (ICD-281.1)    Route: IM    Site: R deltoid    Exp Date: 03/05/2012    Lot #: [id number]    Mfr: [place]    Patient tolerated injection without complications    Given by: [name], CMA (AAMA) (July 09, 2010 9:23 AM)  Orders Added: 1)  Vit B12 1000 mcg [J3420] 2)  Admin of Therapeutic Inj  intramuscular or subcutaneous [96372] 3)  Est. Patient 40-64 years [99396] 4)  Est. Patient Level III [85027]     Appended Document: CPX // RS Recieved a message saying that rx's sent to [facility] didn't go thru. Rx's faxed electronically to [facility]. [name], CMA (AAMA)  July 09, 2010 2:32 PM    Clinical Lists Changes  Medications: Rx of ATENOLOL 50 MG TABS (ATENOLOL) Take 1 tablet by mouth once a day;  #90 x 0;  Signed;  Entered by: [name], CMA (AAMA);  Authorized by: [name] MD;  Method used: Faxed to [facility]*, , ,   , Ph: [phone], Fax: [phone] Rx of LISINOPRIL-HYDROCHLOROTHIAZIDE 20-25 MG TABS (LISINOPRIL-HYDROCHLOROTHIAZIDE) 1 by mouth once daily;  #90 x 0;  Signed;  Entered by: [name], CMA (AAMA);  Authorized by: [name] MD;  Method used: Faxed to [facility]*, , , [place]  , Ph: [phone], Fax: [phone] Rx of SYNTHROID 75 MCG TABS (LEVOTHYROXINE SODIUM) Take 1 tablet by mouth once a day;  #90 x 0;  Signed;  Entered by: [name], CMA (AAMA);  Authorized by: [name] MD;  Method used: Faxed to [facility]*, , , [place]  , Ph: [phone], Fax: [phone] Rx of FLUTICASONE PROPIONATE 50 MCG/ACT SUSP (FLUTICASONE PROPIONATE) 2 sprays each nostril once daily;  #3 x 0;  Signed;  Entered by: [name], CMA (AAMA);  Authorized by: [name] MD;  Method used: Faxed to [facility]*, , , [place]  , Ph: [phone],  Fax: [phone]    Prescriptions: FLUTICASONE PROPIONATE 50 MCG/ACT SUSP (FLUTICASONE PROPIONATE) 2 sprays each nostril once daily  #3 x 0   Entered by:   [name], CMA (AAMA)   Authorized by:   [name] MD   Signed by:   [name], CMA (AAMA) on 07/09/2010   Method used:   Faxed to ...       [facility]* (mail-order)             , [place]         Ph: [phone]       Fax: [phone]   RxID:   [id number] SYNTHROID 75 MCG TABS (LEVOTHYROXINE SODIUM) Take 1 tablet by mouth once a day  #90 x 0   Entered by:   [name], CMA (AAMA)   Authorized by:   [name] MD   Signed by:   [name], CMA (AAMA) on 07/09/2010   Method used:   Faxed to ...       [facility]* (mail-order)             , [place]         Ph: [phone]  Fax: [phone]   RxID:   [id number] LISINOPRIL-HYDROCHLOROTHIAZIDE 20-25 MG TABS (LISINOPRIL-HYDROCHLOROTHIAZIDE) 1 by mouth once daily  #90 x 0   Entered by:   [name], CMA ([place])   Authorized by:   [name] MD   Signed by:   [name], CMA (AAMA) on 07/09/2010   Method used:   Faxed to ...       [facility]* (mail-order)             , [place]         Ph: [phone]       Fax: [phone]   RxID:   [id number] ATENOLOL 50 MG TABS (ATENOLOL) Take 1 tablet by mouth once a day  #90 x 0   Entered by:   [name], CMA (AAMA)   Authorized by:   [name] MD   Signed by:   [name], CMA (AAMA) on 07/09/2010   Method used:   Faxed to ...       [facility]* (mail-order)             , [place]         Ph: [phone]       Fax: [phone]   RxID:   [id number]

## 2010-10-07 NOTE — Assessment & Plan Note (Signed)
Summary: B12 INJ // RS  Nurse Visit   Allergies: 1)  Benadryl (Diphenhydramine Hcl)  Medication Administration  Injection # 1:    Medication: Vit B12 1000 mcg    Diagnosis: ANEMIA, VITAMIN B12 DEFICIENCY NEC (ICD-281.1)    Route: IM    Site: L deltoid    Exp Date: 02/13    Lot #: [id number]    Mfr: [place]    Patient tolerated injection without complications    Given by: Chipper Oman, RN (March 09, 2010 8:17 AM)  Orders Added: 1)  Vit B12 1000 mcg [J3420] 2)  Admin of Therapeutic Inj  intramuscular or subcutaneous [62952]

## 2010-10-07 NOTE — Progress Notes (Signed)
Summary: note  Phone Note Call from Patient Call back at Home Phone [phone]   Summary of Call: Needs note that she is medically okay to have knee surgery in [place] to Dr. Maureen Ralphs fax [phone] and Larene [place] to call for any other details. Initial call taken by: Shelbie Hutching, RN,  October 07, 2009 12:11 PM  Follow-up for Phone Call        Last  ov was  october  and no recent EKG in the chart.  Can only say her medical problems are stable as of that time.  Other wise  Needs pre op OV  if pore info or assessment needed. Follow-up by: [name] MD,  October 12, 2009 2:55 PM  Additional Follow-up for Phone Call Additional follow up Details #1::        Spoke with pt and she would like [place] to send what we have and if they need more then she will schedule an office visit. Additional Follow-up by: Sherron Monday, CMA (AAMA),  October 12, 2009 3:54 PM

## 2010-10-07 NOTE — Medication Information (Signed)
Summary: Fluticasone Prop / [employer] [place]  Fluticasone Prop / [employer] [place]   Imported By: Rise Patience 08/24/2010 11:07:39  _____________________________________________________________________  External Attachment:    Type:   Image     Comment:   External Document

## 2010-10-07 NOTE — Assessment & Plan Note (Signed)
Summary: MED CLEARANCE FOR KNEE SURG // RS   Vital Signs:  Patient profile:   60 year old female Menstrual status:  postmenopausal Weight:      315 pounds Pulse rate:   78 / minute BP sitting:   120 / 80  (right arm) Cuff size:   large  Vitals Entered By: Sherron Monday, CMA (AAMA) (October 22, 2009 10:46 AM) CC: Medical Clearance-Surgery is schedule March 30.   History of Present Illness: [name]  comesin today for  evaluation because she is not have repeat TKR on her right knee per dr Maureen Ralphs on March 30th.   She has continued pain and limitation of  movement . not on advil aleve since  last 6 months because not helping    trying to  1/2 hydrocodone in the past.  She has her Pre op on MArch 3rd.  Since her last visit No problems with CP sob Neuro signs .     Bp has been good . No se of meds  Thyroid : no change Mood and pain: cymbalta seems to have helped .  Vit b 12   No balance or numbness issues  Obesity and hperglycemia  : her last hg a1c was 6.1   no dx of diabetes   Preventive Screening-Counseling & Management  Alcohol-Tobacco     Alcohol drinks/day: <1     Smoking Status: never  Caffeine-Diet-Exercise     Caffeine use/day: <1     Does Patient Exercise: yes     Type of exercise: bike     Times/week: 2  Current Medications (verified): 1)  Atenolol 50 Mg Tabs (Atenolol) .... Take 1 Tablet By Mouth Once A Day 2)  Hydrocodone-Acetaminophen 7.5-325 Mg Tabs (Hydrocodone-Acetaminophen) .... Take As Directed 3)  Lisinopril-Hydrochlorothiazide 20-25 Mg Tabs (Lisinopril-Hydrochlorothiazide) .Marland Kitchen.. 1 By Mouth Once Daily 4)  Synthroid 75 Mcg Tabs (Levothyroxine Sodium) .... Take 1 Tablet By Mouth Once A Day 5)  Cyanocobalamin 1000 Mcg/ml Inj Soln (Cyanocobalamin) .Marland Kitchen.. 1 Cc  Q 4-6 Weeks 6)  Vitamin D 50000 Unit  Caps (Ergocalciferol) .Marland Kitchen.. 1 By Mouth Twice A Week. 7)  Flonase 50 Mcg/act  Susp (Fluticasone Propionate) .... 2 Sprays Each Nare Q D 8)  Cymbalta 30 Mg Cpep  (Duloxetine Hcl) .Marland Kitchen.. 1 By Mouth Once Daily 9)  Omega 3 340 Mg Cpdr (Omega-3 Fatty Acids)  Allergies (verified): 1)  Benadryl (Diphenhydramine Hcl)  Past History:  Past medical, surgical, family and social histories (including risk factors) reviewed, and no changes noted (except as noted below).  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 [name]- Derm    Past Surgical History: Reviewed history from 05/12/2009 and no changes required. Total Knee Replacement Lt-2001                                         Rt-2007 Eye Muscle Surgery  1952 Pregnancy 1976  Family History: Reviewed history from 05/12/2009 and no changes required. niece with anemia probably related to periods.  but no celiac disease orother anemia Family History Diabetes 1st degree relative father Family History Hypertension  bro and sis  fa died of liver cancer  Ht DM  died in 25s  COPD ht  osteoporosis mom  Bro deceased in  MVA 20's  Social History: Reviewed history from 05/12/2009 and no changes required. Never Smoked Divorced hh of 1    No pets .      Review of Systems  The patient denies anorexia, fever, weight loss, weight gain, decreased hearing, chest pain, syncope, dyspnea on exertion, prolonged cough, hemoptysis, abdominal pain, melena, hematochezia, transient blindness, unusual weight change, abnormal bleeding, enlarged lymph nodes, and angioedema.    Physical Exam  General:  Well-developed,well-nourished,in no acute distress; alert,appropriate and cooperative throughout examination Head:  normocephalic and atraumatic.   Eyes:  vision grossly intact, pupils equal, and pupils round.   Ears:  no external deformities.   Nose:  no external deformity and no external erythema.   Neck:  No deformities, masses, or tenderness noted. Lungs:  Normal  respiratory effort, chest expands symmetrically. Lungs are clear to auscultation, no crackles or wheezes.no dullness.   Heart:  normal rate, regular rhythm, no gallop, no rub, no JVD, and no lifts.  soft sem only at lusb no radiation to neck or elsewhere nl carotid pulses and no bruits Msk:  no joint swelling, no joint warmth, and no redness over joints.   Pulses:  pulses intact without delay   Extremities:  healed scars on both knees , no redness or effusion Neurologic:  alert & oriented X3.  antalgic gait  Skin:  turgor normal, color normal, no ecchymoses, and no petechiae.   Cervical Nodes:  No lymphadenopathy noted Psych:  Oriented X3, normally interactive, good eye contact, not anxious appearing, and not depressed appearing.   EKG NSR rate  72   pr .216 borderline prolonged   no change form past EKGs.   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 to have re do of left knee Her updated medication list for this problem includes:    Hydrocodone-acetaminophen 7.5-325 Mg Tabs (Hydrocodone-acetaminophen) .Marland Kitchen... Take as directed  Orders: EKG w/ Interpretation (93000)  Problem # 2:  PREOPERATIVE EXAMINATION (ICD-V72.84)  no contraindications to surgery and cv and pulm stable .  unclear exercise tolerance because of her pain and obesity but is fairly active otherwise.   Orders: EKG w/ Interpretation (93000)  Problem # 3:  HYPERTENSION (ICD-401.9)  controlled   Her updated medication list for this problem includes:    Atenolol 50 Mg Tabs (Atenolol) .Marland Kitchen... Take 1 tablet by mouth once a day    Lisinopril-hydrochlorothiazide 20-25 Mg Tabs (Lisinopril-hydrochlorothiazide) .Marland Kitchen... 1 by mouth once daily  BP today: 120/80 Prior BP: 120/80 (07/16/2009)  Prior 10 Yr Risk Heart Disease: 11 % (07/16/2009)  Labs Reviewed: K+: 4.3 (07/09/2009) Creat: : 0.9 (07/09/2009)   Chol: 186 (05/04/2009)   HDL: 39.00  (05/04/2009)   LDL: 116 (05/04/2009)   TG: 154.0 (05/04/2009)  Orders: EKG w/ Interpretation (93000)  Problem # 4:  MORBID OBESITY (ICD-278.01) Assessment: Comment Only  Problem # 5:  HYPOTHYROIDISM (ICD-244.9)  Her updated medication list for this problem includes:    Synthroid 75 Mcg Tabs (Levothyroxine sodium) .Marland Kitchen... Take 1 tablet by mouth once a day  Labs Reviewed: TSH: 1.96 (05/04/2009)    HgBA1c: 6.2 (07/09/2009) Chol: 186 (05/04/2009)   HDL: 39.00 (05/04/2009)   LDL: 116 (05/04/2009)   TG: 154.0 (05/04/2009)  Problem # 6:  CARDIAC MURMUR (ICD-785.2) this is mild and has been noted off an on for years without proble, and  no hemodaynamic  signs or changes  .     no a ci  to surgery.   Problem # 7:  ANEMIA, VITAMIN B12 DEFICIENCY NEC (ICD-281.1)  Her updated medication list for this problem includes:    Cyanocobalamin 1000 Mcg/ml Inj Soln (Cyanocobalamin) .Marland Kitchen... 1 cc  q 4-6 weeks doing well on  parenteral  rx   Hgb: 12.1 (07/09/2009)   Hct: 35.7 (07/09/2009)   Platelets: 201.0 (07/09/2009) RBC: 4.00 (07/09/2009)   RDW: 15.5 (07/09/2009)   WBC: 7.2 (07/09/2009) MCV: 89.2 (07/09/2009)   MCHC: 33.9 (07/09/2009) Ferritin: 29.4 (06/06/2007) Iron: 48 (07/09/2009)   % Sat: 13.0 (07/09/2009) B12: 513 (07/09/2009)   Folate: 7.8 (06/06/2007)   TSH: 1.96 (05/04/2009)  Orders: Vit B12 1000 mcg (J3420) Admin of Therapeutic Inj  intramuscular or subcutaneous (46503)  Complete Medication List: 1)  Atenolol 50 Mg Tabs (Atenolol) .... Take 1 tablet by mouth once a day 2)  Hydrocodone-acetaminophen 7.5-325 Mg Tabs (Hydrocodone-acetaminophen) .... Take as directed 3)  Lisinopril-hydrochlorothiazide 20-25 Mg Tabs (Lisinopril-hydrochlorothiazide) .Marland Kitchen.. 1 by mouth once daily 4)  Synthroid 75 Mcg Tabs (Levothyroxine sodium) .... Take 1 tablet by mouth once a day 5)  Cyanocobalamin 1000 Mcg/ml Inj Soln (Cyanocobalamin) .Marland Kitchen.. 1 cc  q 4-6 weeks 6)  Vitamin D 50000 Unit Caps (Ergocalciferol) .Marland Kitchen.. 1  by mouth twice a week. 7)  Flonase 50 Mcg/act Susp (Fluticasone propionate) .... 2 sprays each nare q d 8)  Cymbalta 30 Mg Cpep (Duloxetine hcl) .Marland Kitchen.. 1 by mouth once daily 9)  Omega 3 340 Mg Cpdr (Omega-3 fatty acids)  Patient Instructions: 1)  reviewed paper record. 2)  Acceptable risk for surgery .  EKG done today shows borderline  prolonged PR interval and otherwise nl.  copy given to patient and we will send a  copy of this note to Dr Maretta Los office    Medication Administration  Injection # 1:    Medication: Vit B12 1000 mcg    Diagnosis: ANEMIA, VITAMIN B12 DEFICIENCY [place] (ICD-281.1)    Route: IM    Site: R deltoid    Exp Date: 05/07/2011    Lot #: [id number]    Mfr: [place]    Patient tolerated injection without complications    Given by: Sherron Monday, [place] ([place]) (October 22, 2009 11:05 AM)  Orders Added: 1)  Vit B12 1000 mcg [J3420] 2)  Admin of Therapeutic Inj  intramuscular or subcutaneous [96372] 3)  Est. Patient Level IV [68127] 4)  EKG w/ Interpretation [93000]

## 2010-10-07 NOTE — Progress Notes (Signed)
Summary: refill on hydrocodone  Phone Note From Pharmacy   Caller: [place]  [phone]* Reason for Call: Needs renewal Details for Reason: hydrocodone/ace 7.5/325 Summary of Call: last filled on 08/09/2009 #60 Initial call taken by: Sherron Monday, [place] (AAMA),  September 22, 2009 5:05 PM  Follow-up for Phone Call        ok to refill x 2  Follow-up by: [name] MD,  September 22, 2009 5:38 PM  Additional Follow-up for Phone Call Additional follow up Details #1::        Rx sent to pharmacy. Additional Follow-up by: Sherron Monday, CMA (AAMA),  September 23, 2009 9:26 AM    Prescriptions: HYDROCODONE-ACETAMINOPHEN 7.5-325 MG TABS (HYDROCODONE-ACETAMINOPHEN) Take as directed  #60 x 1   Entered by:   Sherron Monday, CMA (AAMA)   Authorized by:   [name] MD   Signed by:   Sherron Monday, CMA (AAMA) on 09/23/2009   Method used:   Handwritten   RxID:   [id number]

## 2010-10-07 NOTE — Miscellaneous (Signed)
  Clinical Lists Changes  Problems: Added new problem of OSTEOPETROSIS (LHT-342.87)  patient had advised that she has the mild type of osteopetrosis that runs in her family.

## 2010-10-07 NOTE — Assessment & Plan Note (Signed)
Summary: form completion/ssc   Vital Signs:  Patient profile:   60 year old female Menstrual status:  postmenopausal Temp:     98.3 degrees F oral Pulse rate:   72 / minute BP sitting:   130 / 80  (left arm) Cuff size:   large  Vitals Entered By: Sherron Monday, CMA Deborra Medina) (May 17, 2010 10:58 AM)  Contraindications/Deferment of Procedures/Staging:    Test/Procedure: Weight Refused    Reason for deferment: patient declined-cannot calculate BMI  CC: Form Completion for Rt shoulder Surgery Oct 14th.   History of Present Illness: [name] comes in today for pre op evaluation for total right shoulder  repalecemetn per Dr [name]   Right shoulder arthritis had been prblematic and  she    recently had cortisone shot. and some better   mobility.   Is left handed .    Her left knee revision became a repeat TKR  in the spring and is doing well with this.  No major changes otherwise in her health history. No falls .  NO CV . PULM  problems .   ORTHO:      pain med about one a day.  Hydrocodone 5 mg dosing  Bp :controlled  needs refill .   Thyroid : no change in meds .   B12 deficieny : due for shot.    Preventive Screening-Counseling & Management  Alcohol-Tobacco     Alcohol drinks/day: <1     Smoking Status: never  Caffeine-Diet-Exercise     Caffeine use/day: <1     Does Patient Exercise: yes     Type of exercise: bike     Times/week: 2  Current Medications (verified): 1)  Atenolol 50 Mg Tabs (Atenolol) .... Take 1 Tablet By Mouth Once A Day 2)  Hydrocodone-Acetaminophen 7.5-325 Mg Tabs (Hydrocodone-Acetaminophen) .... Take As Directed 3)  Lisinopril-Hydrochlorothiazide 20-25 Mg Tabs (Lisinopril-Hydrochlorothiazide) .Marland Kitchen.. 1 By Mouth Once Daily 4)  Synthroid 75 Mcg Tabs (Levothyroxine Sodium) .... Take 1 Tablet By Mouth Once A Day 5)  Cyanocobalamin 1000 Mcg/ml Inj Soln (Cyanocobalamin) .Marland Kitchen.. 1 Cc  Q 4-6 Weeks 6)  Vitamin D 50000 Unit  Caps (Ergocalciferol) .Marland Kitchen.. 1  By Mouth Twice A Week. 7)  Cymbalta 30 Mg Cpep (Duloxetine Hcl) .Marland Kitchen.. 1 By Mouth Once Daily 8)  Omega 3 340 Mg Cpdr (Omega-3 Fatty Acids)  Allergies (verified): 1)  Benadryl (Diphenhydramine Hcl)  Past History:  Past medical, surgical, family and social histories (including risk factors) reviewed, and no changes noted (except as noted below).  Past Medical History: Reviewed history from 10/22/2009 and no changes required. 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 [name]- Derm    Past Surgical History: Total Knee Replacement Lt-2001                                         Rt-2007                                              2011 revision left  Eye Muscle Surgery  1952 Pregnancy 1976  Past History:  Care Management: Gynecology: [name] Orthopedics: [name] Dermatology: [name]  Family History: Reviewed history from 05/12/2009 and no changes required. niece with anemia probably related to periods.  but no celiac disease orother anemia Family History Diabetes 1st degree relative father Family History Hypertension  bro and sis  fa died of liver cancer  Ht DM  died in 87s  COPD ht  osteoporosis mom  Bro deceased in MVA 05-Nov-2022  Social History: Reviewed history from 05/12/2009 and no changes required. Never Smoked Divorced hh of 1    No pets .      Review of Systems  The patient denies anorexia, fever, weight loss, weight gain, vision loss, decreased hearing, chest pain, syncope, dyspnea on exertion, prolonged cough, abdominal pain, melena, hematochezia, severe indigestion/heartburn, muscle weakness, transient blindness, abnormal bleeding, enlarged lymph nodes, and angioedema.         rest of ros neg or no change   Physical Exam  General:  alert, well-developed, well-nourished, and well-hydrated.  able to get up on table   without assistance  Head:  normocephalic and atraumatic.   Eyes:  vision grossly intact, pupils equal, and pupils round.   Ears:  L ear normal.   Mouth:  pharynx pink and moist.  tongue midline Neck:  No deformities, masses, or tenderness noted. Lungs:  Normal respiratory effort, chest expands symmetrically. Lungs are clear to auscultation, no crackles or wheezes. Heart:  normal rate, regular rhythm, no gallop, no rub, no JVD, and no lifts.  soft sem only at lusb no radiation to neck or elsewhere nl carotid pulses and no bruits Abdomen:  Bowel sounds positive,abdomen soft and non-tender without masses, organomegaly or noted. Msk:  rom right shoulder  can rais above head  decrease adduction  but no swelling  knee no acute swelling  or redness  Pulses:  pulses intact without delay   Extremities:  healed scars on both knees , no redness or effusion  trc edema some varicosities  Neurologic:  alert & oriented X3.  cn n grossly intact  Skin:  turgor normal, color normal, no ecchymoses, no petechiae, and no purpura.   Cervical Nodes:  No lymphadenopathy noted Psych:  Oriented X3, normally interactive, good eye contact, not anxious appearing, and not depressed appearing.     Impression & Recommendations:  Problem # 1:  ARTHRITIS, RIGHT SHOULDER (ICD-716.91) problematic and for surgical repair replacement  Problem # 2:  PREOPERATIVE EXAMINATION (ICD-V72.84) no serious CI to  to surgery .  no change in status   Problem # 3:  OSTEOPETROSIS (ICD-756.52) becomes problematic in surgery but stable.  and surgeons aware   Problem # 4:  HYPERTENSION (ICD-401.9) Assessment: Unchanged due for labs in the fall  Her updated medication list for this problem includes:    Atenolol 50 Mg Tabs (Atenolol) .Marland Kitchen... Take 1 tablet by mouth once a day    Lisinopril-hydrochlorothiazide 20-25 Mg Tabs (Lisinopril-hydrochlorothiazide) .Marland Kitchen... 1 by mouth once daily  BP today: 130/80 Prior BP: 120/80 (10/22/2009)  Prior  10 Yr Risk Heart Disease: 11 % (07/16/2009)  Labs Reviewed: K+: 4.3 (07/09/2009) Creat: : 0.9 (07/09/2009)   Chol: 186 (05/04/2009)   HDL: 39.00 (05/04/2009)   LDL: 116 (05/04/2009)   TG: 154.0 (05/04/2009)  Problem # 5:  HYPOTHYROIDISM (ICD-244.9) ok to refill until lab eval in OCtober  Her updated medication list for this problem includes:    Synthroid 75 Mcg Tabs (Levothyroxine sodium) .Marland Kitchen... Take 1 tablet by mouth once a day  Problem # 6:  ANEMIA, VITAMIN B12 DEFICIENCY NEC (ICD-281.1)  Her  updated medication list for this problem includes:    Cyanocobalamin 1000 Mcg/ml Inj Soln (Cyanocobalamin) .Marland Kitchen... 1 cc  q 4-6 weeks  Orders: Vit B12 1000 mcg (J3420) Admin of Therapeutic Inj  intramuscular or subcutaneous (48546)  Problem # 7:  MORBID OBESITY (ICD-278.01) Assessment: Unchanged no change per patient  Ht: 61 (05/12/2009)   Wt: 315 (10/22/2009)   BMI: 60.11 (05/12/2009)  Complete Medication List: 1)  Atenolol 50 Mg Tabs (Atenolol) .... Take 1 tablet by mouth once a day 2)  Hydrocodone-acetaminophen 7.5-325 Mg Tabs (Hydrocodone-acetaminophen) .... Take as directed 3)  Lisinopril-hydrochlorothiazide 20-25 Mg Tabs (Lisinopril-hydrochlorothiazide) .Marland Kitchen.. 1 by mouth once daily 4)  Synthroid 75 Mcg Tabs (Levothyroxine sodium) .... Take 1 tablet by mouth once a day 5)  Cyanocobalamin 1000 Mcg/ml Inj Soln (Cyanocobalamin) .Marland Kitchen.. 1 cc  q 4-6 weeks 6)  Vitamin D 50000 Unit Caps (Ergocalciferol) .Marland Kitchen.. 1 by mouth twice a week. 7)  Cymbalta 30 Mg Cpep (Duloxetine hcl) .Marland Kitchen.. 1 by mouth once daily 8)  Omega 3 340 Mg Cpdr (Omega-3 fatty acids)  Other Orders: Admin 1st Vaccine (27035) Flu Vaccine 61yr + ((00938  Patient Instructions: 1)   labs at your check up. 2)  will send form To Dr NVeverly Fells for pre op evaluation.  Prescriptions: SYNTHROID 75 MCG TABS (LEVOTHYROXINE SODIUM) Take 1 tablet by mouth once a day  #90 x 0   Entered by:   SSherron Monday CMA (AAMA)   Authorized by:   WBurnis MedinMD   Signed by:   SSherron Monday CMA (AAMA) on 05/17/2010   Method used:   Faxed to ...       [facility]* (mail-order)             , [place]        Ph: [phone]      Fax: [phone]  RxID:   [id number]-HYDROCHLOROTHIAZIDE 20-25 MG TABS (LISINOPRIL-HYDROCHLOROTHIAZIDE) 1 by mouth once daily  #90 x 0   Entered by:   SSherron Monday CMA ([place]   Authorized by:   WBurnis MedinMD   Signed by:   SSherron Monday CMA (AAMA) on 05/17/2010   Method used:   Faxed to ...       [facility]* (mail-order)             , [place]        Ph: [phone]      Fax: [phone]  RxID:   [id number] 50 MG TABS (ATENOLOL) Take 1 tablet by mouth once a day  #90 x 0   Entered by:   SSherron Monday CMA (AAMA)   Authorized by:   WBurnis MedinMD   Signed by:   SSherron Monday CMA (AAMA) on 05/17/2010   Method used:   Faxed to ...       [facility]* (mail-order)             , [place]        Ph: [phone]      Fax: [phone]  RxID:   [id number] 75 MCG TABS (LEVOTHYROXINE SODIUM) Take 1 tablet by mouth once a day  #90 x 0   Entered and Authorized by:   WBurnis MedinMD   Signed by:   WBurnis MedinMD on 05/17/2010   Method used:   Electronically to        [facility]             ,           Ph: [phone]       Fax: [phone]   RxID:   [id number] LISINOPRIL-HYDROCHLOROTHIAZIDE 20-25 MG TABS (LISINOPRIL-HYDROCHLOROTHIAZIDE) 1 by mouth once daily  #90 x 0   Entered and Authorized by:   [name] MD   Signed by:   [name] MD on 05/17/2010   Method used:   Electronically to        [facility]* (mail-order)             , [place]         Ph: [phone]       Fax: [phone]   RxID:   [id number] ATENOLOL 50 MG TABS (ATENOLOL) Take 1 tablet by mouth once a day  #90 x 0   Entered and Authorized by:   [name] MD   Signed by:   [name] MD on 05/17/2010   Method  used:   Electronically to        [facility] (mail-order)             , [place]         Ph: [phone]       Fax: [phone]   RxID:   [id number]  Rx's didn't go thru electronically. I had to fax them electronically. Sherron Monday, CMA (AAMA)  May 17, 2010 11:49 AM     Flu Vaccine Consent Questions     Do you have a history of severe allergic reactions to this vaccine? no    Any prior history of allergic reactions to egg and/or gelatin? no    Do you have a sensitivity to the preservative Thimersol? no    Do you have a past history of Guillan-Barre Syndrome? no    Do you currently have an acute febrile illness? no    Have you ever had a severe reaction to latex? no    Vaccine information given and explained to patient? yes    Are you currently pregnant? no    Lot Number:[id number]   Exp Date:03/05/2011   Site Given  Left Deltoid IMlu Sherron Monday, CMA (AAMA)  May 17, 2010 11:02 AM   Medication Administration  Injection # 1:    Medication: Vit B12 1000 mcg    Diagnosis: ANEMIA, VITAMIN B12 DEFICIENCY [place] (ICD-281.1)    Route: IM    Site: R deltoid    Exp Date: 02/04/2012    Lot #: [id number]    Mfr: [place]    Patient tolerated injection without complications    Given by: Sherron Monday, [place] Deborra Medina) (May 17, 2010 12:38 PM)  Orders Added: 1)  Admin 1st Vaccine [90471] 2)  Flu Vaccine 3yr + [[83419]3)  Vit B12 1000 mcg [J3420] 4)  Admin of Therapeutic Inj  intramuscular or subcutaneous [96372] 5)  Est. Patient Level IV [[62229]

## 2010-10-07 NOTE — Progress Notes (Signed)
Summary: refills needed today  Phone Note Call from Patient Call back at Home Phone [phone]   Caller: Patient--live call Summary of Call: [facility], which she said that thwy were never received. she is out and will need [facility]. Lisinopril HCT and Synthroid. Please call her to confirm. Initial call taken by: Despina Arias,  June 11, 2010 8:59 AM  Follow-up for Phone Call        Pt states that she would [facility] medication. Pt states that [facility] never recieved these rx's. Pt to wait until she comes in to see md to see what she wants to do about them. Follow-up by: Sherron Monday, CMA (AAMA),  June 11, 2010 9:07 AM    Prescriptions: SYNTHROID 75 MCG TABS (LEVOTHYROXINE SODIUM) Take 1 tablet by mouth once a day  #30 x 0   Entered by:   Sherron Monday, CMA (AAMA)   Authorized by:   [name] MD   Signed by:   Sherron Monday, CMA (AAMA) on 06/11/2010   Method used:   Electronically to        [facility]  [phone]* (retail)       [place], [place]  [address]       Ph: [phone] or [phone]       Fax: [phone]   RxID:   [id number] LISINOPRIL-HYDROCHLOROTHIAZIDE 20-25 MG TABS (LISINOPRIL-HYDROCHLOROTHIAZIDE) 1 by mouth once daily  #30 x 0   Entered by:   Sherron Monday, CMA (AAMA)   Authorized by:   [name] MD   Signed by:   Sherron Monday, CMA (AAMA) on 06/11/2010   Method used:   Electronically to        [facility]  [phone]* (retail)       [place], [place]  [address]       Ph: [phone] or [phone]       Fax: [phone]   RxID:   [id number]

## 2010-10-07 NOTE — Letter (Signed)
Summary: Request for Surgical Clearance/[facility]   Request for Surgical Clearance/[facility]   Imported By: Laural Benes 05/21/2010 15:08:19  _____________________________________________________________________  External Attachment:    Type:   Image     Comment:   External Document

## 2010-10-13 ENCOUNTER — Ambulatory Visit (INDEPENDENT_AMBULATORY_CARE_PROVIDER_SITE_OTHER): Payer: BC Managed Care – PPO | Admitting: Internal Medicine

## 2010-10-13 DIAGNOSIS — E538 Deficiency of other specified B group vitamins: Secondary | ICD-10-CM

## 2010-10-13 MED ORDER — CYANOCOBALAMIN 1000 MCG/ML IJ SOLN
1000.0000 ug | Freq: Once | INTRAMUSCULAR | Status: AC
  Administered 2010-10-13: 1000 ug via INTRAMUSCULAR

## 2010-10-13 NOTE — Assessment & Plan Note (Signed)
Summary: follow up/ssc   Vital Signs:  Patient profile:   60 year old female Menstrual status:  postmenopausal Weight:      326 pounds Pulse rate:   72 / minute BP sitting:   120 / 80  (left arm) Cuff size:   large  Vitals Entered By: Sherron Monday, CMA (AAMA) (October 05, 2010 8:21 AM) CC: Pt wants to discuss seeing Dr. Claria Dice with md   History of Present Illness: [name] comes in today   for follow up  of multiple medical problems . Since last visit : Drainage  continuing .      z pack for bronchitis and  flonase no help.   and   was still  bad   and rx with  ceftin  and   last week    was seen and now on clindamycin.       some better.       No  imaging at this point. No coughing  . Had labs done but somehow   incorrect tests were ordered for ? reason   see last note  to have sugar and iron studies done.  No bleeding   since surgery october Sleep erratic with wakening since surgeries oct .   Nt really from  pain.  never got back to reg sleep Ht stable  thyroid mp change   Preventive Screening-Counseling & Management  Alcohol-Tobacco     Alcohol drinks/day: <1     Smoking Status: never  Caffeine-Diet-Exercise     Caffeine use/day: <1     Does Patient Exercise: yes     Type of exercise: bike     Times/week: 2  Current Medications (verified): 1)  Atenolol 50 Mg Tabs (Atenolol) .... Take 1 Tablet By Mouth Once A Day 2)  Hydrocodone-Acetaminophen 7.5-325 Mg Tabs (Hydrocodone-Acetaminophen) .... Take As Directed 3)  Lisinopril-Hydrochlorothiazide 20-25 Mg Tabs (Lisinopril-Hydrochlorothiazide) .Marland Kitchen.. 1 By Mouth Once Daily 4)  Synthroid 75 Mcg Tabs (Levothyroxine Sodium) .... Take 1 Tablet By Mouth Once A Day 5)  Cyanocobalamin 1000 Mcg/ml Inj Soln (Cyanocobalamin) .Marland Kitchen.. 1 Cc  Q 4-6 Weeks 6)  Vitamin D 50000 Unit  Caps (Ergocalciferol) .Marland Kitchen.. 1 By Mouth Twice A Week. 7)  Cymbalta 30 Mg Cpep (Duloxetine Hcl) .Marland Kitchen.. 1 By Mouth Once Daily 8)  Omega 3 340 Mg Cpdr (Omega-3 Fatty  Acids) 9)  Aspirin 81 Mg  Tabs (Aspirin) 10)  Fluticasone Propionate 50 Mcg/act Susp (Fluticasone Propionate) .... 2 Sprays Each Nostril Once Daily  Allergies (verified): 1)  Benadryl (Diphenhydramine Hcl)  Past History:  Past medical, surgical, family and social histories (including risk factors) reviewed, and no changes noted (except as noted below).  Past Medical History: Reviewed history from 07/09/2010 and no changes required. 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 [name]- Derm    Past Surgical History: Reviewed history from 07/09/2010 and no changes required. Total Knee Replacement Lt-2001                                         Rt-2007  October 17, 2009 revision left  Eye Muscle Surgery  1952 Pregnancy 1976 Rt shoulder surgery   Oct 2011  Past History:  Care Management: Gynecology: [name] Orthopedics: [name] Dermatology: [name] ENT: Ernesto Rutherford  Family History: Reviewed history from 05/12/2009 and no changes required. niece with anemia probably related to periods.  but no celiac disease orother anemia Family History Diabetes 1st degree relative father Family History Hypertension  bro and sis  fa died of liver cancer  Ht DM  died in 58s  COPD ht  osteoporosis mom  Bro deceased in MVA 17-Oct-2022  Social History: Reviewed history from 07/09/2010 and no changes required. Never Smoked Divorced hh of 1    No pets .  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      Review of Systems       The patient complains of hoarseness.  The patient denies anorexia, fever, weight loss, weight gain, chest pain, dyspnea on exertion, peripheral edema, prolonged cough, hemoptysis, melena, hematochezia, severe indigestion/heartburn, transient blindness, unusual weight change, abnormal bleeding,  enlarged lymph nodes, and angioedema.         see hpi   Physical Exam  General:  Well-developed,well-nourished,in no acute distress; alert,appropriate and cooperative throughout examination mod hoarse in na  no cough  Head:  normocephalic and atraumatic.   Eyes:  vision grossly intact.   Neck:  No deformities, masses, or tenderness noted. Lungs:  Normal respiratory effort, chest expands symmetrically. Lungs are clear to auscultation, no crackles or wheezes. Heart:  normal rate, regular rhythm, no gallop, and no rub.  lusb murmur 1/6  no radiation Extremities:  trace left pedal edema, 1+ left pedal edema, trace right pedal edema, and 1+ right pedal edema.   Neurologic:  grossly non focal  Skin:  turgor normal, color normal, no ecchymoses, and no petechiae.   Cervical Nodes:  No lymphadenopathy noted Psych:  Normal eye contact, appropriate affect. Cognition appears normal.    Impression & Recommendations:  Problem # 1:  ANEMIA (ICD-285.9) poss combo  taking 1 otc iron every other day and b12 shot every 4-6 weeks.  no bleeding  but had lower leve l  after her october  shoulder surgery Her updated medication list for this problem includes:    Cyanocobalamin 1000 Mcg/ml Inj Soln (Cyanocobalamin) .Marland Kitchen... 1 cc  q 4-6 weeks  Orders: Venipuncture (42595) TLB-CBC Platelet - w/Differential (85025-CBCD) TLB-IBC Pnl (Iron/FE;Transferrin) (83550-IBC) TLB-Ferritin (82728-FER) TLB-B12, Serum-Total ONLY (63875-I43) Specimen Handling (99000)  Problem # 2:  HYPERGLYCEMIA (ICD-790.29) Assessment: Deteriorated labs to be done today   Problem # 3:  POSTNASAL DRIP (ICD-784.91) chronic with recent   infection under care Dr Marcelline Mates.  Problem # 4:  MORBID OBESITY (ICD-278.01)  Ht: 61.5 (07/09/2010)   Wt: 326 (10/05/2010)   BMI: 60.82 (07/09/2010)  Problem # 5:  OTHER SLEEP DISTURBANCES (ICD-780.59) Assessment: New seems to be since surgery   feels off  not really pain awakening   disc use of sleep  aids .   Discussed risk benefit       of meds trial   Complete Medication List: 1)  Atenolol 50 Mg Tabs (Atenolol) .... Take 1 tablet by mouth once a day 2)  Hydrocodone-acetaminophen 7.5-325 Mg Tabs (Hydrocodone-acetaminophen) .... Take as directed 3)  Lisinopril-hydrochlorothiazide 20-25 Mg Tabs (Lisinopril-hydrochlorothiazide) .Marland Kitchen.. 1 by mouth once daily 4)  Synthroid 75 Mcg Tabs (Levothyroxine sodium) .... Take 1 tablet by mouth once a day 5)  Cyanocobalamin 1000 Mcg/ml Inj  Soln (Cyanocobalamin) .Marland Kitchen.. 1 cc  q 4-6 weeks 6)  Vitamin D 50000 Unit Caps (Ergocalciferol) .Marland Kitchen.. 1 by mouth twice a week. 7)  Cymbalta 30 Mg Cpep (Duloxetine hcl) .Marland Kitchen.. 1 by mouth once daily 8)  Omega 3 340 Mg Cpdr (Omega-3 fatty acids) 9)  Aspirin 81 Mg Tabs (Aspirin) 10)  Fluticasone Propionate 50 Mcg/act Susp (Fluticasone propionate) .... 2 sprays each nostril once daily 11)  Zolpidem Tartrate 10 Mg Tabs (Zolpidem tartrate) .Marland Kitchen.. 1 by mouth hs as needed sleep  Other Orders: TLB-A1C / Hgb A1C (Glycohemoglobin) (83036-A1C)  Patient Instructions: 1)  You will be informed of lab results when available. you should be credited with the other labs done.  2)  Ok to try sleep aid   to reset your sleep cycle. 3)  follow up depending on ;lab results. Prescriptions: ZOLPIDEM TARTRATE 10 MG TABS (ZOLPIDEM TARTRATE) 1 by mouth hs as needed sleep  #20 x 0   Entered and Authorized by:   [name] MD   Signed by:   [name] MD on 10/05/2010   Method used:   Print then Give to Patient   RxID:   [id number]    Orders Added: 1)  Venipuncture [30940] 2)  TLB-CBC Platelet - w/Differential [85025-CBCD] 3)  TLB-IBC Pnl (Iron/FE;Transferrin) [83550-IBC] 4)  TLB-A1C / Hgb A1C (Glycohemoglobin) [83036-A1C] 5)  TLB-Ferritin [82728-FER] 6)  TLB-B12, Serum-Total ONLY [82607-B12] 7)  Specimen Handling [99000] 8)  Est. Patient Level IV [76808]

## 2010-10-24 ENCOUNTER — Emergency Department (HOSPITAL_COMMUNITY): Payer: BC Managed Care – PPO

## 2010-10-24 ENCOUNTER — Other Ambulatory Visit (HOSPITAL_COMMUNITY): Payer: BC Managed Care – PPO

## 2010-10-24 ENCOUNTER — Encounter (HOSPITAL_COMMUNITY): Payer: Self-pay | Admitting: Radiology

## 2010-10-24 ENCOUNTER — Inpatient Hospital Stay (HOSPITAL_COMMUNITY)
Admission: EM | Admit: 2010-10-24 | Discharge: 2010-10-28 | DRG: 533 | Disposition: A | Discharge status: Rehabilitation Facility | Payer: BC Managed Care – PPO | Attending: Family Medicine | Admitting: Family Medicine

## 2010-10-24 ENCOUNTER — Inpatient Hospital Stay (HOSPITAL_COMMUNITY): Payer: BC Managed Care – PPO

## 2010-10-24 DIAGNOSIS — I959 Hypotension, unspecified: Secondary | ICD-10-CM | POA: Diagnosis present

## 2010-10-24 DIAGNOSIS — Z7982 Long term (current) use of aspirin: Secondary | ICD-10-CM

## 2010-10-24 DIAGNOSIS — Z96619 Presence of unspecified artificial shoulder joint: Secondary | ICD-10-CM

## 2010-10-24 DIAGNOSIS — G4733 Obstructive sleep apnea (adult) (pediatric): Secondary | ICD-10-CM | POA: Diagnosis present

## 2010-10-24 DIAGNOSIS — G8190 Hemiplegia, unspecified affecting unspecified side: Secondary | ICD-10-CM | POA: Diagnosis present

## 2010-10-24 DIAGNOSIS — I10 Essential (primary) hypertension: Secondary | ICD-10-CM | POA: Diagnosis present

## 2010-10-24 DIAGNOSIS — E785 Hyperlipidemia, unspecified: Secondary | ICD-10-CM | POA: Diagnosis present

## 2010-10-24 DIAGNOSIS — E662 Morbid (severe) obesity with alveolar hypoventilation: Secondary | ICD-10-CM | POA: Diagnosis present

## 2010-10-24 DIAGNOSIS — I6350 Cerebral infarction due to unspecified occlusion or stenosis of unspecified cerebral artery: Principal | ICD-10-CM | POA: Diagnosis present

## 2010-10-24 DIAGNOSIS — Z96659 Presence of unspecified artificial knee joint: Secondary | ICD-10-CM

## 2010-10-24 DIAGNOSIS — R252 Cramp and spasm: Secondary | ICD-10-CM | POA: Diagnosis not present

## 2010-10-24 DIAGNOSIS — J9600 Acute respiratory failure, unspecified whether with hypoxia or hypercapnia: Secondary | ICD-10-CM | POA: Diagnosis not present

## 2010-10-24 DIAGNOSIS — D649 Anemia, unspecified: Secondary | ICD-10-CM | POA: Diagnosis present

## 2010-10-24 DIAGNOSIS — Z79899 Other long term (current) drug therapy: Secondary | ICD-10-CM

## 2010-10-24 DIAGNOSIS — M810 Age-related osteoporosis without current pathological fracture: Secondary | ICD-10-CM | POA: Diagnosis present

## 2010-10-24 DIAGNOSIS — R799 Abnormal finding of blood chemistry, unspecified: Secondary | ICD-10-CM | POA: Diagnosis present

## 2010-10-24 DIAGNOSIS — I639 Cerebral infarction, unspecified: Secondary | ICD-10-CM

## 2010-10-24 DIAGNOSIS — E039 Hypothyroidism, unspecified: Secondary | ICD-10-CM | POA: Diagnosis present

## 2010-10-24 HISTORY — DX: Essential (primary) hypertension: I10

## 2010-10-24 HISTORY — DX: Cerebral infarction, unspecified: I63.9

## 2010-10-24 LAB — CK TOTAL AND CKMB (NOT AT ARMC): Relative Index: INVALID (ref 0.0–2.5)

## 2010-10-24 LAB — URINALYSIS, ROUTINE W REFLEX MICROSCOPIC
Hgb urine dipstick: NEGATIVE
Nitrite: NEGATIVE
Specific Gravity, Urine: 1.021 (ref 1.005–1.030)
Urobilinogen, UA: 0.2 mg/dL (ref 0.0–1.0)
pH: 5.5 (ref 5.0–8.0)

## 2010-10-24 LAB — CBC
HCT: 35.3 % — ABNORMAL LOW (ref 36.0–46.0)
HCT: 33.2 % — ABNORMAL LOW (ref 36.0–46.0)
Hemoglobin: 10.4 g/dL — ABNORMAL LOW (ref 12.0–15.0)
MCH: 25.5 pg — ABNORMAL LOW (ref 26.0–34.0)
MCV: 81.5 fL (ref 78.0–100.0)
MCV: 80.2 fL (ref 78.0–100.0)
MCV: 81.4 fL (ref 78.0–100.0)
Platelets: 241 10*3/uL (ref 150–400)
RBC: 4.33 MIL/uL (ref 3.87–5.11)
RBC: 4.29 MIL/uL (ref 3.87–5.11)
RBC: 4.08 MIL/uL (ref 3.87–5.11)
RDW: 19.4 % — ABNORMAL HIGH (ref 11.5–15.5)
WBC: 11.1 10*3/uL — ABNORMAL HIGH (ref 4.0–10.5)
WBC: 11.1 10*3/uL — ABNORMAL HIGH (ref 4.0–10.5)
WBC: 11.9 10*3/uL — ABNORMAL HIGH (ref 4.0–10.5)

## 2010-10-24 LAB — DIFFERENTIAL
Basophils Absolute: 0 10*3/uL (ref 0.0–0.1)
Eosinophils Relative: 3 % (ref 0–5)
Lymphocytes Relative: 10 % — ABNORMAL LOW (ref 12–46)
Lymphs Abs: 1.1 10*3/uL (ref 0.7–4.0)
Neutro Abs: 9.1 10*3/uL — ABNORMAL HIGH (ref 1.7–7.7)
Neutrophils Relative %: 82 % — ABNORMAL HIGH (ref 43–77)

## 2010-10-24 LAB — COMPREHENSIVE METABOLIC PANEL
AST: 28 U/L (ref 0–37)
BUN: 22 mg/dL (ref 6–23)
CO2: 27 mEq/L (ref 19–32)
Calcium: 9 mg/dL (ref 8.4–10.5)
Chloride: 97 mEq/L (ref 96–112)
Creatinine, Ser: 0.99 mg/dL (ref 0.4–1.2)
GFR calc Af Amer: >60 mL/min (ref >60)
GFR calc non Af Amer: 57 mL/min — ABNORMAL LOW (ref >60)
Glucose, Bld: 135 mg/dL — ABNORMAL HIGH (ref 70–99)
Total Bilirubin: 0.6 mg/dL (ref 0.3–1.2)

## 2010-10-24 LAB — PROTIME-INR: Prothrombin Time: 12.8 seconds (ref 11.6–15.2)

## 2010-10-24 LAB — BRAIN NATRIURETIC PEPTIDE: Pro B Natriuretic peptide (BNP): 106 pg/mL — ABNORMAL HIGH (ref 0.0–100.0)

## 2010-10-24 LAB — APTT: aPTT: 28 seconds (ref 24–37)

## 2010-10-24 LAB — POCT CARDIAC MARKERS: Myoglobin, poc: 91.4 ng/mL (ref 12–200)

## 2010-10-24 LAB — HEMOGLOBIN A1C: Hgb A1c MFr Bld: 6.5 % — ABNORMAL HIGH (ref <5.7)

## 2010-10-24 MED ORDER — IOHEXOL 300 MG/ML  SOLN
100.0000 mL | Freq: Once | INTRAMUSCULAR | Status: AC | PRN
  Administered 2010-10-24: 100 mL via INTRAVENOUS

## 2010-10-25 ENCOUNTER — Other Ambulatory Visit (HOSPITAL_COMMUNITY): Payer: BC Managed Care – PPO

## 2010-10-25 ENCOUNTER — Inpatient Hospital Stay (HOSPITAL_COMMUNITY): Payer: BC Managed Care – PPO

## 2010-10-25 DIAGNOSIS — I517 Cardiomegaly: Secondary | ICD-10-CM

## 2010-10-25 HISTORY — PX: OTHER SURGICAL HISTORY: SHX169

## 2010-10-25 HISTORY — PX: TRANSTHORACIC ECHOCARDIOGRAM: SHX275

## 2010-10-25 LAB — CBC
HCT: 32.5 % — ABNORMAL LOW (ref 36.0–46.0)
MCHC: 30.2 g/dL (ref 30.0–36.0)
MCV: 80.8 fL (ref 78.0–100.0)
RDW: 19.7 % — ABNORMAL HIGH (ref 11.5–15.5)

## 2010-10-25 LAB — GLUCOSE, CAPILLARY
Glucose-Capillary: 132 mg/dL — ABNORMAL HIGH (ref 70–99)
Glucose-Capillary: 127 mg/dL — ABNORMAL HIGH (ref 70–99)
Glucose-Capillary: 145 mg/dL — ABNORMAL HIGH (ref 70–99)

## 2010-10-25 LAB — BLOOD GAS, ARTERIAL
Acid-Base Excess: 4.6 mmol/L — ABNORMAL HIGH (ref 0.0–2.0)
Bicarbonate: 29.2 mEq/L — ABNORMAL HIGH (ref 20.0–24.0)
TCO2: 30.7 mmol/L (ref 0–100)
pCO2 arterial: 48.6 mmHg — ABNORMAL HIGH (ref 35.0–45.0)
pH, Arterial: 7.396 (ref 7.350–7.400)
pO2, Arterial: 72.4 mmHg — ABNORMAL LOW (ref 80.0–100.0)

## 2010-10-25 LAB — FOLATE: Folate: 6 ng/mL

## 2010-10-25 LAB — VITAMIN B12: Vitamin B-12: 429 pg/mL (ref 211–911)

## 2010-10-25 LAB — IRON AND TIBC
Iron: 50 ug/dL (ref 42–135)
Saturation Ratios: 14 % — ABNORMAL LOW (ref 20–55)
TIBC: 364 ug/dL (ref 250–470)

## 2010-10-25 LAB — LIPID PANEL: LDL Cholesterol: 101 mg/dL — ABNORMAL HIGH (ref 0–99)

## 2010-10-25 LAB — CARDIAC PANEL(CRET KIN+CKTOT+MB+TROPI)
CK, MB: 4.8 ng/mL — ABNORMAL HIGH (ref 0.3–4.0)
Total CK: 368 U/L — ABNORMAL HIGH (ref 7–177)

## 2010-10-26 ENCOUNTER — Inpatient Hospital Stay (HOSPITAL_COMMUNITY): Payer: BC Managed Care – PPO

## 2010-10-26 DIAGNOSIS — I6330 Cerebral infarction due to thrombosis of unspecified cerebral artery: Secondary | ICD-10-CM

## 2010-10-26 LAB — GLUCOSE, CAPILLARY
Glucose-Capillary: 111 mg/dL — ABNORMAL HIGH (ref 70–99)
Glucose-Capillary: 111 mg/dL — ABNORMAL HIGH (ref 70–99)

## 2010-10-26 LAB — BASIC METABOLIC PANEL
BUN: 9 mg/dL (ref 6–23)
Calcium: 8.7 mg/dL (ref 8.4–10.5)
GFR calc non Af Amer: >60 mL/min (ref >60)
Glucose, Bld: 100 mg/dL — ABNORMAL HIGH (ref 70–99)
Potassium: 3.7 mEq/L (ref 3.5–5.1)

## 2010-10-26 LAB — CARDIAC PANEL(CRET KIN+CKTOT+MB+TROPI)
CK, MB: 23 ng/mL (ref 0.3–4.0)
Relative Index: 2 (ref 0.0–2.5)
Total CK: 1163 U/L — ABNORMAL HIGH (ref 7–177)
Troponin I: 1.68 ng/mL (ref 0.00–0.06)

## 2010-10-26 MED ORDER — IOHEXOL 350 MG/ML SOLN
100.0000 mL | Freq: Once | INTRAVENOUS | Status: AC | PRN
  Administered 2010-10-26: 100 mL via INTRAVENOUS

## 2010-10-27 LAB — CBC
HCT: 31.5 % — ABNORMAL LOW (ref 36.0–46.0)
Hemoglobin: 9.8 g/dL — ABNORMAL LOW (ref 12.0–15.0)
MCH: 25.3 pg — ABNORMAL LOW (ref 26.0–34.0)
MCHC: 31.1 g/dL (ref 30.0–36.0)
MCV: 81.2 fL (ref 78.0–100.0)
RBC: 3.88 MIL/uL (ref 3.87–5.11)

## 2010-10-27 LAB — GLUCOSE, CAPILLARY
Glucose-Capillary: 121 mg/dL — ABNORMAL HIGH (ref 70–99)
Glucose-Capillary: 96 mg/dL (ref 70–99)

## 2010-10-27 LAB — BASIC METABOLIC PANEL
BUN: 8 mg/dL (ref 6–23)
CO2: 27 mEq/L (ref 19–32)
Calcium: 8.7 mg/dL (ref 8.4–10.5)
Chloride: 97 mEq/L (ref 96–112)
Creatinine, Ser: 0.69 mg/dL (ref 0.4–1.2)
Glucose, Bld: 135 mg/dL — ABNORMAL HIGH (ref 70–99)

## 2010-10-27 NOTE — Consult Note (Signed)
NAME:  [name], [name] NO.:  [id number]  MEDICAL RECORD NO.:  [id number]           PATIENT TYPE:  I  LOCATION:  6568                         FACILITY:  [facility]  PHYSICIAN:  Quay Burow, M.D.   DATE OF BIRTH:  11/22/1950  DATE OF CONSULTATION:  10/24/2010 DATE OF DISCHARGE:                                CONSULTATION   HISTORY OF PRESENT ILLNESS:  [name] is a 60 year old female followed by Dr. Regis Bill.  She has no prior history of coronary disease.  She has no history of chest pain or arrhythmia.  She was admitted from home after calling EMS with left arm and left leg weakness.  There was concern she had a right brain stroke by her symptoms.  CT scan was unrevealing, although there was lot of artifacts from movement.  An MRI has not been done because the patient had inability to lie still.  She has no history of chest pain or palpitations as noted.  We were asked to see her because her troponins are abnormal.  Her initial troponins are negative, second troponin was 1.68 and the next troponin is 0.02.  Her CKs went from 97 to 1163 to 573 with an MB of 2.1, 23, and 6.4, all relative indexes are normal.  Her EKG shows sinus rhythm with Qs in III and aVF.  She is currently pain-free, but uncomfortable and restless because of chronic back problems.  PAST MEDICAL HISTORY:  Remarkable for DJD.  She has had left knee surgery in 2001 with revision in November of 2009 and redo left knee replacement in March 2011.  She had a right knee done November of 2007. She had a hysterectomy in July of 2009.  She has morbid obesity and treated hypothyroidism.  She has treated hypertension.  She has had previous right shoulder surgery, October 2011, I believe this was done arthroscopically.  [facility] leg and back pain.  HOME MEDICATIONS: 1. Synthroid 0.075 mg a day. 2. Align 4 mg daily. 3. Doxycycline 100 mg twice a day. 4.  Omega-3. 5. Mucinex. 6. Vitamin D. 7. Aspirin 81 mg. 8. Cymbalta 30 mg. 9. Lisinopril/HCTZ 20/25 daily. 10.Atenolol 50 mg a day.  ALLERGIES:  SHE IS INTOLERANT TO BENADRYL AND IBUPROFEN.  SOCIAL HISTORY:  She is single, she lives alone.  She is nonsmoker.  FAMILY HISTORY:  Remarkable that her mother had vascular disease with history of carotid endarterectomy and her father had diabetes.  REVIEW OF SYSTEMS:  Essentially unremarkable except for noted above. She has had some shortness of breath with no orthopnea or PND.  PHYSICAL EXAM:  VITAL SIGNS:  Blood pressure 130/86, temperature 98, pulse 112. GENERAL:  She is a morbidly obese female, in no acute distress. HEENT:  Normocephalic.  Extraocular movements are intact.  Sclerae anicteric.  Lids and conjunctivae within normal limits. NECK:  Without bruit or JVD. CHEST:  Reveals some expiratory wheezing bilaterally. CARDIAC EXAM:  Reveals regular rate and rhythm with diminished heart sounds. ABDOMEN:  Morbidly obese, nontender. EXTREMITIES:  Reveal no obvious edema.  NEURO EXAM:  Grossly intact.  She is awake, alert, oriented, and cooperative.  LABORATORY DATA:  Troponins 0.01, 1.68, and 0.02.  CKs were 97, 1163, and 573.  MBs are 2.1, 23, and 6.4, all relative indexes are normal. BNP is 106.  Sodium 134, potassium 4.3, BUN 22, creatinine 0.99.  LFTs were normal.  White count 9.7, hemoglobin 9.8, hematocrit 32.5, platelets 280.  D-dimer 2.74.  CT of her head was without acute changes, but there was lot of artifact from movement.  CT of her chest showed no pulmonary embolism and incidental finding of coronary calcification. Her EKG shows sinus rhythm with inferior Qs in III and aVF.  IMPRESSION: 1. Abnormal troponin of unclear significance. 2. Abnormal EKG. 3. Right brain resolving of ischemic neurologic deficit. 4. Treated hypothyroidism. 5. Morbid obesity, suspected sleep apnea. 6. Degenerative joint disease and chronic  pain. 7. Treated hypertension.  PLAN:  The patient will be seen by Dr. Gwenlyn Found.  Echo was pending.  She may need an outpatient Myoview.     Erlene Quan, P.A.   ______________________________ Quay Burow, M.D.    Meryl Dare  D:  10/25/2010  T:  10/25/2010  Job:  [id number]  cc:   Standley Brooking. Regis Bill, MD  Electronically Signed by Kerin Ransom P.A. on 10/25/2010 03:33:34 PM Electronically Signed by Quay Burow M.D. on 10/27/2010 11:35:44 AM

## 2010-10-28 ENCOUNTER — Inpatient Hospital Stay (HOSPITAL_COMMUNITY)
Admission: RE | Admit: 2010-10-28 | Discharge: 2010-11-05 | DRG: 462 | Disposition: A | Discharge status: Still In Hospital | Payer: BC Managed Care – PPO | Source: Other Acute Inpatient Hospital | Attending: Physical Medicine & Rehabilitation | Admitting: Physical Medicine & Rehabilitation

## 2010-10-28 DIAGNOSIS — Z96659 Presence of unspecified artificial knee joint: Secondary | ICD-10-CM

## 2010-10-28 DIAGNOSIS — G8110 Spastic hemiplegia affecting unspecified side: Secondary | ICD-10-CM

## 2010-10-28 DIAGNOSIS — M51379 Other intervertebral disc degeneration, lumbosacral region without mention of lumbar back pain or lower extremity pain: Secondary | ICD-10-CM

## 2010-10-28 DIAGNOSIS — G8190 Hemiplegia, unspecified affecting unspecified side: Secondary | ICD-10-CM

## 2010-10-28 DIAGNOSIS — Z5189 Encounter for other specified aftercare: Principal | ICD-10-CM

## 2010-10-28 DIAGNOSIS — Z7982 Long term (current) use of aspirin: Secondary | ICD-10-CM

## 2010-10-28 DIAGNOSIS — E039 Hypothyroidism, unspecified: Secondary | ICD-10-CM

## 2010-10-28 DIAGNOSIS — Z860100 Personal history of colon polyps, unspecified: Secondary | ICD-10-CM

## 2010-10-28 DIAGNOSIS — D649 Anemia, unspecified: Secondary | ICD-10-CM

## 2010-10-28 DIAGNOSIS — M810 Age-related osteoporosis without current pathological fracture: Secondary | ICD-10-CM

## 2010-10-28 DIAGNOSIS — R7309 Other abnormal glucose: Secondary | ICD-10-CM

## 2010-10-28 DIAGNOSIS — Z79899 Other long term (current) drug therapy: Secondary | ICD-10-CM

## 2010-10-28 DIAGNOSIS — I10 Essential (primary) hypertension: Secondary | ICD-10-CM

## 2010-10-28 DIAGNOSIS — M5137 Other intervertebral disc degeneration, lumbosacral region: Secondary | ICD-10-CM

## 2010-10-28 DIAGNOSIS — I6350 Cerebral infarction due to unspecified occlusion or stenosis of unspecified cerebral artery: Secondary | ICD-10-CM

## 2010-10-28 DIAGNOSIS — E785 Hyperlipidemia, unspecified: Secondary | ICD-10-CM

## 2010-10-28 DIAGNOSIS — R252 Cramp and spasm: Secondary | ICD-10-CM

## 2010-10-28 DIAGNOSIS — Z96619 Presence of unspecified artificial shoulder joint: Secondary | ICD-10-CM

## 2010-10-28 DIAGNOSIS — R29810 Facial weakness: Secondary | ICD-10-CM

## 2010-10-28 DIAGNOSIS — I6330 Cerebral infarction due to thrombosis of unspecified cerebral artery: Secondary | ICD-10-CM

## 2010-10-28 LAB — BASIC METABOLIC PANEL
BUN: 7 mg/dL (ref 6–23)
Calcium: 8.8 mg/dL (ref 8.4–10.5)
Chloride: 97 mEq/L (ref 96–112)
Creatinine, Ser: 0.74 mg/dL (ref 0.4–1.2)
GFR calc Af Amer: >60 mL/min (ref >60)
GFR calc non Af Amer: >60 mL/min (ref >60)

## 2010-10-28 LAB — GLUCOSE, CAPILLARY
Glucose-Capillary: 99 mg/dL (ref 70–99)
Glucose-Capillary: 111 mg/dL — ABNORMAL HIGH (ref 70–99)

## 2010-10-28 LAB — CBC
MCH: 25.9 pg — ABNORMAL LOW (ref 26.0–34.0)
MCHC: 31.9 g/dL (ref 30.0–36.0)
MCV: 81.1 fL (ref 78.0–100.0)
Platelets: 196 10*3/uL (ref 150–400)
RBC: 3.75 MIL/uL — ABNORMAL LOW (ref 3.87–5.11)
RDW: 19.7 % — ABNORMAL HIGH (ref 11.5–15.5)

## 2010-10-28 LAB — URINALYSIS, ROUTINE W REFLEX MICROSCOPIC
Bilirubin Urine: NEGATIVE
Ketones, ur: NEGATIVE mg/dL
Nitrite: NEGATIVE
Protein, ur: NEGATIVE mg/dL
Urobilinogen, UA: 0.2 mg/dL (ref 0.0–1.0)

## 2010-10-28 LAB — URINE MICROSCOPIC-ADD ON

## 2010-10-28 LAB — MRSA PCR SCREENING: MRSA by PCR: NEGATIVE

## 2010-10-29 DIAGNOSIS — G8110 Spastic hemiplegia affecting unspecified side: Secondary | ICD-10-CM

## 2010-10-29 DIAGNOSIS — I6330 Cerebral infarction due to thrombosis of unspecified cerebral artery: Secondary | ICD-10-CM

## 2010-10-29 LAB — DIFFERENTIAL
Basophils Relative: 0 % (ref 0–1)
Eosinophils Absolute: 0.3 10*3/uL (ref 0.0–0.7)
Eosinophils Relative: 4 % (ref 0–5)
Lymphs Abs: 0.9 10*3/uL (ref 0.7–4.0)
Neutrophils Relative %: 82 % — ABNORMAL HIGH (ref 43–77)

## 2010-10-29 LAB — COMPREHENSIVE METABOLIC PANEL
AST: 39 U/L — ABNORMAL HIGH (ref 0–37)
Albumin: 3.5 g/dL (ref 3.5–5.2)
BUN: 7 mg/dL (ref 6–23)
Creatinine, Ser: 0.87 mg/dL (ref 0.4–1.2)
GFR calc Af Amer: >60 mL/min (ref >60)
Total Protein: 6.7 g/dL (ref 6.0–8.3)

## 2010-10-29 LAB — CBC
MCH: 25.4 pg — ABNORMAL LOW (ref 26.0–34.0)
MCV: 81.1 fL (ref 78.0–100.0)
Platelets: 212 10*3/uL (ref 150–400)
RBC: 4.18 MIL/uL (ref 3.87–5.11)
RDW: 20.2 % — ABNORMAL HIGH (ref 11.5–15.5)
WBC: 8.5 10*3/uL (ref 4.0–10.5)

## 2010-10-29 LAB — GLUCOSE, CAPILLARY: Glucose-Capillary: 106 mg/dL — ABNORMAL HIGH (ref 70–99)

## 2010-10-29 NOTE — Discharge Summary (Signed)
NAME:  [name], [name] NO.:  [id number]  MEDICAL RECORD NO.:  [id number]           PATIENT TYPE:  LOCATION:                                 FACILITY:  PHYSICIAN:  [name], MD       DATE OF BIRTH:  [birth year]  DATE OF ADMISSION: DATE OF DISCHARGE:                        DISCHARGE SUMMARY - REFERRING   PRIMARY CARE PHYSICIAN:  Standley Brooking. Regis Bill, MD  NEUROLOGISTMargie Billet Neurology.  CARDIOLOGIST:  Quay Burow, MD  DISCHARGE DIAGNOSES: 1. Acute right frontal cerebrovascular accident. 2. Hyperlipidemia. 3. Abnormal troponin. 4. Hypertension. 5. Anemia. 6. Hypotension, resolved. 7. Obesity. 8. Hypothyroidism. 9. Osteoarthritis, status post bilateral knee surgeries as well as     right shoulder arthroplasty. 10.History of osteoporosis. 11.History of vitamin B12 deficiency. 12.History of hypercholesterolemia.  DISCHARGE MEDICATIONS: 1. Plavix 75 mg p.o. daily with meals. 2. Hydrocodone/APAP 5/325 mg p.o. every 6 hours as needed for pain. 3. Xopenex 0.63 mg inhaled q.3 h. p.r.n. as needed. 4. Methocarbamol 500 mg p.o. q.6 h. as needed for muscle cramps. 5. Metoprolol 25 mg p.o. twice daily. 6. Senna/docusate 8.5/50 one tablet p.o. daily as needed. 7. Simvastatin 10 mg p.o. daily 8. Align 4 mg p.o. daily. 9. Cymbalta 30 mg p.o. daily. 10.Lisinopril/hydrochlorothiazide 20/25 mg p.o. q.a.m. 11.Guaifenesin extended release 300 mg p.o. twice daily. 12.Omega-3 fatty acids 1 tablet p.o. daily. 13.Synthroid 75 mcg p.o. q.a.m. 14.Vitamin D2 50,000 units p.o. twice daily.  BRIEF ADMITTING HISTORY AND PHYSICAL:  [name] is a 60 year old morbidly obese Caucasian female who presented with left-sided weakness.  PAST MEDICAL HISTORY:  Significant for; 1. Hypertension. 2. Hypothyroidism. 3. Osteoporosis.  RADIOLOGY/IMAGING: 1. The patient had a CT of the head without contrast, which shows no     acute intracranial findings. 2. The patient had chest  x-ray two-view which shows cardiomegaly with     central vascular congestion, but no focal findings. 3. The patient had CT of the chest with contrast, which was negative     for acute PE or thoracic aortic dissection.  Coronary and aortic     calcification noted. 4. The patient had chest x-ray on October 25, 2010, which shows no     acute findings. 5. The patient had CT of the head and neck on October 26, 2010, which     showed a small filling defect in the terminal left internal carotid     artery, which may be specific for atherosclerotic disease or small     amount of thrombus.  Interval development of hypodensity in the     right medial frontal lobe compatible with acute infarct.  CT angio     of the neck shows no significant abnormality. 6. The patient had a 2-D echocardiogram on November 02, 2010, which     showed left ventricle cavity size with normal wall thickness, was     increased in the pattern of severe LVH, systolic function was     normal, ejection fraction was 60-65%, wall motion was normal, there     were no regional wall motion abnormalities. 7. The patient had lower extremity  Dopplers on October 25, 2010,     which showed no obvious evidence for DVT or superficial thrombosis     bilaterally. 8. The patient had a carotid Dopplers on November 02, 2010, which     shows minimal plaque noted.  No significant ICA stenosis, vertebral     artery flow is antegrade.  LABORATORY DATA:  CBC shows a white count of 6.8, hemoglobin 9.7, hematocrit 34.4, platelet count 196.  Electrolytes normal with a creatinine of 0.74.  Initial troponin was 0.01, peaked at 1.68 and was then at 0.02, then 0.02, hemoglobin A1c of 6.5, LDL is 101.  Iron panel; serum iron was 50, TIBC was 364, UIBC is 314, vitamin B12 was 429, ferritin was 40.  UA was negative for nitrites and leukocytes.  MRSA by PCR was negative.  TSH was 2.058.  HOSPITAL COURSE BY PROBLEM: 1. Acute right frontal  cerebrovascular accident.  Neurology was     consulted.  The patient obtained the above imaging.  A 2-D     echocardiogram and lower extremity Dopplers were negative.     Neurology changed her aspirin to Plavix.  After PT evaluation, it     was felt that the patient would benefit for inpatient rehab as a     result inpatient rehab consultation was obtained.  The patient to     discharge to inpatient rehab after discharge. 2. Hyperlipidemia.  The patient does well. 3. Left leg cramps.  The patient reports that it was due to     positioning reports that when she lays on her left side, she gets     leg cramps.  Hopefully, after the patient goes to inpatient rehab     with physical therapy, her leg cramps will be resolved.  The     patient has p.r.n. Robaxin and muscle relaxer as needed. 4. Abnormal troponin.  Cardiology was consulted.  Based on normal     echo, Cardiology is planning on doing an outpatient stress test for     the patient. 5. Hypertension, initially was well controlled; however, her blood     pressure started increasing at the time of discharge from 164/80.     As a result, the patient was resumed back on her home medication of     lisinopril/hydrochlorothiazide 20/25 mg. 6. Anemia, hemoglobin stable during the course of hospital stay.  Iron     panel does not suggest iron-deficiency anemia. 7. Hypothyroidism.  Continue the patient on Synthroid. 8. Obesity, outpatient management. 9. Probable obstructive sleep apnea, based on obesity outpatient sleep     study.  DISPOSITION AND FOLLOWUP: 1. The patient to discharge to inpatient rehab. 2. The patient to follow up with her primary care physician, Dr.     Regis Bill in 1 to 2 weeks after discharge. 3. The patient is to follow with Dr. Leonie Man Neurology in 1-2 months. 4. The patient to follow with Dr. Gwenlyn Found, Cardiology for outpatient     stress test after discharge. 5. The patient to call [phone] for sleep study after  discharge.  Time spent on discharge talking to the patient, coordinating care was 35 minutes.   [name], MD   SR/MEDQ  D:  10/28/2010  T:  10/28/2010  Job:  [id number]  Electronically Signed by Alveta Heimlich Jeshawn Melucci  on 10/29/2010 08:45:36 PM

## 2010-10-30 DIAGNOSIS — I6330 Cerebral infarction due to thrombosis of unspecified cerebral artery: Secondary | ICD-10-CM

## 2010-10-30 DIAGNOSIS — G8110 Spastic hemiplegia affecting unspecified side: Secondary | ICD-10-CM

## 2010-10-30 LAB — GLUCOSE, CAPILLARY
Glucose-Capillary: 91 mg/dL (ref 70–99)
Glucose-Capillary: 161 mg/dL — ABNORMAL HIGH (ref 70–99)

## 2010-10-30 LAB — URINE CULTURE
Culture  Setup Time: 201202231644
Special Requests: NEGATIVE

## 2010-10-30 NOTE — Consult Note (Signed)
[name], Stacy NO.:  [id number]  MEDICAL RECORD NO.:  [id number]           PATIENT TYPE:  I  LOCATION:  5573                         FACILITY:  [facility]  PHYSICIAN:  Alexis Goodell, MD    DATE OF BIRTH:  09/07/1950  DATE OF CONSULTATION:  10/24/2010 DATE OF DISCHARGE:                                CONSULTATION   CONSULT CALLED BY:  Dr. Zenia Resides.  HISTORY:  [name] is a 60 year old female that went to bed last evening about 9:30 at night and was at baseline at that time.  Awakened approximately 2:30 in the morning and found herself unable to turn in the bed because she was unable to move the left lower extremity.  EMS was called.  They found her vitals to be normal and did not bring her at that time.  The patient called family over at that time and they were waiting until this morning to bring her in to be evaluated.  In the meantime, while the patient was in bed, she reached over to get her walker.  It was too far away and she fell out of the bed.  The patient's family brought her in at that time.  Consult was called for further evaluation.  PAST MEDICAL HISTORY:  Arthritis, hypothyroidism, hypertension, obesity, and osteoporosis.  MEDICATIONS:  Atenolol, lisinopril, hydrochlorothiazide, Synthroid, Cymbalta, and aspirin.  ALLERGIES:  TOPICAL BENADRYL.  SOCIAL HISTORY:  No history of alcohol, tobacco, or illicit drug abuse.  REVIEW OF SYSTEMS:  Positive for shortness of breath that has been present for few months now and prohibits the patient from lying flat for prolonged period of time.  The patient also reports some neck pain which he has seen ENT and a dentist without any etiology noted.  PHYSICAL EXAMINATION:  VITAL SIGNS:  Blood pressure 110/50, heart rate 126, respiratory rate 30, temperature 99.1, and O2 sat 97% on 2 L nasal cannula.  NEUROLOGIC:  On mental status testing, the patient is alert and oriented.  She follows commands without  difficulty.  Speech is fluent.  Cranial nerve testing II, discs flat bilaterally.  Visual fields grossly intact.  III, IV, and VI, extraocular movements intact. V and VII, mild left facial droop.  VIII grossly intact.  IX and X, positive gag.  XI bilateral shoulder shrug.  XII midline tongue extension.  On motor exam, patient has 5/5 in both the right upper and right lower extremity.  She has 4-/5 strength in the left upper extremity.  She is unable to lift the left leg off of the bed with 3/5 strength noted.  Distally in the left lower extremity though the patient has 2/5 strength.  On sensory testing, the patient has intact pinprick and light touch bilaterally.  Deep tendon reflexes are trace in the upper extremities and absent in the lower extremities.  Plantars are upgoing on the left and downgoing on the right.  LABORATORY DATA:  Sodium 134, potassium 4.3, chloride of 97, CO2 of 27, BUN and creatinine 22 and 0.99 respectively.  Glucose of 135, hemoglobin and hematocrit 10.9 and 35.8 respectively.  PT, INR,  and PTT 12.8, 0.9, 28.  Calcium 9.0, protein 10.4, albumin 3.7.  SGOT and SGPT 28 and 16 respectively. Urinalysis was unremarkable.  Troponins were negative.  BNP was 106.  Head CT was performed; and although there was motion artifact, there did not seem to be any new abnormality noted.  ASSESSMENT:  [name] is a 60 year old female that presents with left- sided weakness indicative of an acute right hemispheric infarct.  She does have risk factors of hypertension.  She also has some breathing issues as well that may present a risk factor also.  PLAN: 1. Work up the patient for obstructive sleep apnea. 2. The patient is unable to have an MRI at this time secondary to     inability to lie flat and lie still.  She may also be too large     for our MRI scanner.  If these above issues are unable to     be circumvented, the patient can have a repeat head CT in 2-3 days. 3.  Echocardiogram and carotid Dopplers to be performed. 4. Plavix 75 mg daily.  May discontinue aspirin. 5. PT/OT. 6. Pulmonary to evaluate the patient for workup of pulmonary issues.          ______________________________ Alexis Goodell, MD     LR/MEDQ  D:  10/24/2010  T:  10/25/2010  Job:  [id number]  Electronically Signed by Alexis Goodell MD on 10/30/2010 05:33:04 PM

## 2010-10-31 LAB — GLUCOSE, CAPILLARY
Glucose-Capillary: 117 mg/dL — ABNORMAL HIGH (ref 70–99)
Glucose-Capillary: 95 mg/dL (ref 70–99)
Glucose-Capillary: 90 mg/dL (ref 70–99)

## 2010-11-01 LAB — GLUCOSE, CAPILLARY: Glucose-Capillary: 118 mg/dL — ABNORMAL HIGH (ref 70–99)

## 2010-11-02 ENCOUNTER — Inpatient Hospital Stay (HOSPITAL_COMMUNITY): Payer: BC Managed Care – PPO

## 2010-11-02 ENCOUNTER — Encounter: Payer: Self-pay | Admitting: Internal Medicine

## 2010-11-02 LAB — GLUCOSE, CAPILLARY: Glucose-Capillary: 106 mg/dL — ABNORMAL HIGH (ref 70–99)

## 2010-11-03 LAB — GLUCOSE, CAPILLARY
Glucose-Capillary: 98 mg/dL (ref 70–99)
Glucose-Capillary: 114 mg/dL — ABNORMAL HIGH (ref 70–99)

## 2010-11-04 DIAGNOSIS — I6330 Cerebral infarction due to thrombosis of unspecified cerebral artery: Secondary | ICD-10-CM

## 2010-11-04 DIAGNOSIS — G8110 Spastic hemiplegia affecting unspecified side: Secondary | ICD-10-CM

## 2010-11-04 LAB — GLUCOSE, CAPILLARY: Glucose-Capillary: 89 mg/dL (ref 70–99)

## 2010-11-05 LAB — GLUCOSE, CAPILLARY: Glucose-Capillary: 106 mg/dL — ABNORMAL HIGH (ref 70–99)

## 2010-11-06 NOTE — H&P (Signed)
[name], [name] NO.:  [id number]  MEDICAL RECORD NO.:  [id number]           PATIENT TYPE:  I  LOCATION:  5400                         FACILITY:  [facility]  PHYSICIAN:  Charlett Blake, M.D.DATE OF BIRTH:  [birth year]  DATE OF ADMISSION:  10/28/2010 DATE OF DISCHARGE:                             HISTORY & PHYSICAL   CHIEF COMPLAINT:  Left-sided weakness.  REASON FOR ADMISSION:  Rehabilitation following stroke.  A 60 year old female with prior history of hypertension, morbid obesity was admitted on October 24, 2010, with left lower extremity weakness. She fell out of the bed.  Cranial CT showed no acute changes.  The patient became short of breath.  CT angiogram of the chest was performed and was negative for pulmonary embolism.  CT angiogram of the head and neck revealed an interval development of right medial frontal lobe infarct.  The angiogram showed a left ICA filling defect questionatheromatous change versus thrombus, however, this did not correlate with her right medial frontal stroke.  The patient had nonspecific changes in cardiac enzymes.  Sleep study was recommended.  As an outpatient, Myoview was recommended as an outpatient.  Carotid Dopplers without ICA stenosis, bilateral lower extremity Dopplers negative for DVT.  Two-D echo showed ejection fraction of 60-65% with calcified mitral annulus.  Neurology evaluated patient and recommended Plavix for CVA prophylaxis.  REVIEW OF SYSTEMS:  Positive for wheezing.  Positive for back pain and weakness on left lower greater than upper.  PAST HISTORY: 1. She was treated for bronchitis with antibiotics within the last 5     weeks. 2. Diverticulosis. 3. Colon polyps. 4. Hypothyroid. 5. Osteoporosis. 6. Dyslipidemia. 7. Degenerative disk disease, lumbar spine. 8. Mild anemia. 9. Borderline diabetes.  PAST SURGICAL HISTORY:  Right TKR 2007, left TKR with revision 2009, right total shoulder  October 2011.  FAMILY HISTORY:  Diabetes, question carcinoma.  SOCIAL HISTORY:  Lives alone, one-level home, one step to enter.  Works in Press [employer], sedentary, no tobacco, no EtOH.  Mother can provide some assistance postdischarge.  Brother and sister to assist postdischarge.  FUNCTIONAL HISTORY:  Independent, working full time prior to admission.  Home meds include lisinopril, hydrochlorothiazide, ferrous sulfate, Cymbalta, omega-3 fatty acids, atenolol, Mucinex, vitamin D, doxycycline, aspirin, Synthroid.  PHYSICAL EXAMINATION:  VITAL SIGNS:  Blood pressure 164/80, pulse 98, respirations 24, height 5 feet 10 inches, weight 110 kg, temp 98.8. GENERAL:  Morbidly obese female in no acute distress. HEENT:  Eyes anicteric, noninjected.  External ENT normal. NECK:  Supple without adenopathy. CHEST:  Respiratory effort is good. LUNGS:  Clear. HEART:  Regular rate and rhythm.  No rubs, murmurs, or extra sounds. She does have 2+ edema, left hand. ABDOMEN:  Positive bowel sounds, soft, nontender to palpation. MUSCULOSKELETAL:  Motor strength is 3/5 in the left deltoid, biceps, triceps, finger flexors.  She is able to oppose finger to the thumb, but slowly for fine motor tasks, left upper extremity.  In the left lower extremity, she has trace hip flexor, trace quad, 0 TA, 0 gastroc. NEUROLOGIC:  Sensation is reported as normal on left side of body.  She has cranial nerves, mild left central VII.  POST ADMISSION PHYSICIAN EVALUATION: 1. 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 2. The patient admitted to receive collaborative interdisciplinary     care between physiatrist rehab nursing staff and therapy team. 3. The patient's level of medical complexity and substantial therapy     needs in context of the medical necessity cannot be provided at a     lesser intensive care such  as SNF. 4. The patient has experienced potential functional loss from her     baseline.  Upon functional assessment at the time of preadmission     screening, the patient was at a +3 total 75% chair.  Currently,     patient is at a max of total 60% bed mobility, +2 total 70%     transfers, max assist upper body care, total assist lower body     care.  Judging by the patient's diagnosis, physical exam,     functional history, the patient has potential functional progress     which will result in measurable gains while in the inpatient rehab.     These gains will be of substantial and practical use upon discharge     to home in facilitating mobility, self-care, and independence.     Interim changes in medical status since preadmission screening are     detailed in the history of present illness. 5. Staff nurse will provide 24-hour management of medical needs as     well as oversight of therapy plan/treatment and provide guidance as     appropriate regarding interactions of two. 6. A 24-hour rehab nursing will assess in management of skin, bowel,     bladder and help integrate therapy concepts, techniques, and     education. 7. PT will assess and treat for pre-gait training, gait training,     neuromusuclar re-education, endurance, safety goals are for     supervision level with mobility. 8. OT will assess and treat for ADLs, cognitive perceptual skills,     neuromuscular re-education safety goals are for modified     independent level, supervision, upper body ADLs and min assist     lower body ADLs. 9. Case Management and Social Work will assess and treat for     psychosocial issues and discharge planning. 10.Team conference will be held weekly to assess patient's     progress/goals and to determine barriers to discharge. 11.The patient demonstrates sufficient medical stability and exercise     capacity to tolerate at least 3 hours of therapy per day at least 5     days per  week. 12.Estimated length of stay 3 weeks.  MEDICAL PROBLEMS AND PLAN: 1. DVT prophylaxis subcu Lovenox 55 units based on weight is 24 hours. 2. Hypertension, monitor on metoprolol at low dose, ACE inhibitor     which will also help with both prophylaxis. 3. Impaired fasting glucose, hemoglobin A1c 6.5, change diet with     modified exercise. 4. Left lower extremity cramps.  Add low-dose baclofen, monitor     tolerance, monitor sedation. 5. Mild anemia, continue ferrous sulfate supplement.  Motivation and mood appeared to be adequate for full participation in inpatient rehabilitation program.  Discussed with rehab medicine services with patient and answered questions.     Charlett Blake, M.D.     AEK/MEDQ  D:  10/28/2010  T:  10/29/2010  Job:  [id number]  cc:   Alexis Goodell, MD Quay Burow, M.D. Standley Brooking. Regis Bill, MD  Electronically Signed by Alysia Penna M.D. on 11/06/2010 01:01:48 PM

## 2010-11-08 ENCOUNTER — Telehealth: Payer: Self-pay | Admitting: *Deleted

## 2010-11-08 NOTE — Telephone Encounter (Signed)
Pt had a stroke 2 weeks ago and needs a follow up with Dr. Regis Bill in the next few weeks. Where can we put her in at?

## 2010-11-08 NOTE — Telephone Encounter (Signed)
3 20 115 30 minutes

## 2010-11-09 NOTE — Telephone Encounter (Signed)
Spoke to pt and she already has an appt schedule for 3/16 at 11am. She wants to keep this appt instead

## 2010-11-15 ENCOUNTER — Ambulatory Visit: Payer: Self-pay | Admitting: Internal Medicine

## 2010-11-17 LAB — DIFFERENTIAL
Eosinophils Relative: 3 % (ref 0–5)
Lymphocytes Relative: 14 % (ref 12–46)
Lymphs Abs: 1.5 10*3/uL (ref 0.7–4.0)
Monocytes Absolute: 0.6 10*3/uL (ref 0.1–1.0)
Monocytes Relative: 5 % (ref 3–12)
Neutro Abs: 8.7 10*3/uL — ABNORMAL HIGH (ref 1.7–7.7)

## 2010-11-17 LAB — TYPE AND SCREEN: Antibody Screen: NEGATIVE

## 2010-11-17 LAB — CBC
HCT: 30.7 % — ABNORMAL LOW (ref 36.0–46.0)
HCT: 37.2 % (ref 36.0–46.0)
Hemoglobin: 11.6 g/dL — ABNORMAL LOW (ref 12.0–15.0)
MCV: 88 fL (ref 78.0–100.0)
MCV: 86.5 fL (ref 78.0–100.0)
Platelets: 200 10*3/uL (ref 150–400)
RBC: 3.49 MIL/uL — ABNORMAL LOW (ref 3.87–5.11)
RDW: 16.8 % — ABNORMAL HIGH (ref 11.5–15.5)
RDW: 16.5 % — ABNORMAL HIGH (ref 11.5–15.5)
WBC: 9.7 10*3/uL (ref 4.0–10.5)
WBC: 11.1 10*3/uL — ABNORMAL HIGH (ref 4.0–10.5)

## 2010-11-17 LAB — BASIC METABOLIC PANEL
BUN: 17 mg/dL (ref 6–23)
Chloride: 99 mEq/L (ref 96–112)
Chloride: 100 mEq/L (ref 96–112)
GFR calc Af Amer: 57 mL/min — ABNORMAL LOW (ref >60)
GFR calc non Af Amer: 47 mL/min — ABNORMAL LOW (ref >60)
GFR calc non Af Amer: 58 mL/min — ABNORMAL LOW (ref >60)
Potassium: 4.3 mEq/L (ref 3.5–5.1)
Potassium: 4.4 mEq/L (ref 3.5–5.1)
Sodium: 139 mEq/L (ref 135–145)

## 2010-11-17 LAB — URINALYSIS, ROUTINE W REFLEX MICROSCOPIC
Bilirubin Urine: NEGATIVE
Hgb urine dipstick: NEGATIVE
Ketones, ur: NEGATIVE mg/dL
Protein, ur: NEGATIVE mg/dL
Urobilinogen, UA: 1 mg/dL (ref 0.0–1.0)

## 2010-11-17 LAB — APTT: aPTT: 29 seconds (ref 24–37)

## 2010-11-17 LAB — SURGICAL PCR SCREEN: Staphylococcus aureus: POSITIVE — AB

## 2010-11-19 ENCOUNTER — Encounter: Payer: Self-pay | Admitting: Internal Medicine

## 2010-11-19 ENCOUNTER — Ambulatory Visit (INDEPENDENT_AMBULATORY_CARE_PROVIDER_SITE_OTHER): Payer: BC Managed Care – PPO | Admitting: Internal Medicine

## 2010-11-19 VITALS — BP 140/80 | HR 90 | Wt 308.0 lb

## 2010-11-19 DIAGNOSIS — I517 Cardiomegaly: Secondary | ICD-10-CM

## 2010-11-19 DIAGNOSIS — E559 Vitamin D deficiency, unspecified: Secondary | ICD-10-CM

## 2010-11-19 DIAGNOSIS — I6350 Cerebral infarction due to unspecified occlusion or stenosis of unspecified cerebral artery: Secondary | ICD-10-CM

## 2010-11-19 DIAGNOSIS — Z8673 Personal history of transient ischemic attack (TIA), and cerebral infarction without residual deficits: Secondary | ICD-10-CM | POA: Insufficient documentation

## 2010-11-19 DIAGNOSIS — I639 Cerebral infarction, unspecified: Secondary | ICD-10-CM

## 2010-11-19 DIAGNOSIS — I10 Essential (primary) hypertension: Secondary | ICD-10-CM

## 2010-11-19 MED ORDER — ATENOLOL 50 MG PO TABS: ORAL_TABLET | ORAL | Status: DC

## 2010-11-19 MED ORDER — LISINOPRIL-HYDROCHLOROTHIAZIDE 20-25 MG PO TABS: 1.0000 | ORAL_TABLET | Freq: Every day | ORAL | Status: DC

## 2010-11-19 MED ORDER — LEVOTHYROXINE SODIUM 75 MCG PO TABS: 75.0000 ug | ORAL_TABLET | Freq: Every day | ORAL | Status: DC

## 2010-11-19 NOTE — Discharge Summary (Signed)
NAMEMARISELLA, [name] NO.:  [id number]  MEDICAL RECORD NO.:  [id number]           PATIENT TYPE:  I  LOCATION:  4270                         FACILITY:  [facility]  PHYSICIAN:  Reesa Chew, P.A.      DATE OF BIRTH:  10-13-1950  DATE OF ADMISSION:  10/28/2010 DATE OF DISCHARGE:  11/05/2010                              DISCHARGE SUMMARY   DISCHARGE DIAGNOSES: 1. Right frontal infarct with left hemiparesis. 2. Hyperthyroid. 3. Hypertension. 4. Suspected sleep apnea. 5. Impaired fasting glucose. 6. Iron-deficiency anemia. 7. History of osteopetrosis. 8. Left ankle soft tissue screen with question of osteochondral lesion     of talus.  HISTORY OF PRESENT ILLNESS:  [name] is a 60 year old female with history of hypertension and morbid obesity admitted October 24, 2010, with left lower extremity weakness and fall out of bed.  CT of head done showed acute changes.  The patient with shortness of breath and CTA chest done was negative for PE.  CTA of head and neck showed interval development of right medial frontal lobe infarct, small filling defect of left ICA question atherosclerosis with a thrombosis.  The patient with nonspecific changes in cardiac enzymes and sleep study as well as Myoview on outpatient basis was recommended by Cardiology.  Carotid Dopplers done showed no ICA stenosis.  Bilateral lower extremity Dopplers done were negative for DVT.  A 2-D echo done showed EF of 60- 65% with calcified mitral annulus.  Neuro is recommending Plavix for CVA prophylaxis.  Therapies were initiated and the patient was evaluated by rehab and felt that she would benefit from a CIR program.  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  ALLERGIES:  To BENADRYL and Ibuprofen.  REVIEW OF SYMPTOMS:  Positive for lumbago weakness as well as wheeze.  FAMILY HISTORY:  Positive for diabetes mellitus and questionable cancer.  SOCIAL HISTORY:  The patient lives alone in 1-level home with one-step at entry.  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  Does not use any tobacco or alcohol.  Family is supportive and to assist as needed past discharge.  FUNCTIONAL HISTORY:  The patient was independent and working full-time prior to admission.  FUNCTIONAL STATUS:  The patient is max total assist 60% for bed mobility plus to total assist 70% for transfers.  She is requiring max assist for upper body care, total assist lower body care.  PHYSICAL EXAMINATION:  VITALS:  Blood pressure 164/80, pulse 98, respirations 24 and temperature 98.8. GENERAL:  Morbidly obese female, alert and oriented x3, in no acute distress. HEENT:  Eyes anicteric, noninjected.  Oral mucosa is pink and moist. Nares patent.  Tongue midline. NECK:  Supple without JVD or lymphadenopathy. LUNGS:  Clear to auscultation bilaterally without wheezes, rales or rhonchi. HEART:  Regular rate and rhythm without murmurs, gallops or rubs. EXTREMITIES:  The patient  with 2+ edema left hand and minimal edema left lower extremity. ABDOMEN:  Soft and nontender with positive bowel sounds. NEUROLOGIC:  The patient is alert and oriented x3 in mild left central VII.  Motor strength is 3/5 left deltoid, biceps, triceps and finger flexures.  She was able to fourth finger and thumb, but slowly for fine motor task.  A right upper extremity is 5/5.  Left lower extremity, she has trace hip flexures, trace quad, 0/5 TA and 0/5 gastroc.  Right lower extremity is 3/5 at hip flexures, quads at 2-4/5 with TA and gastroc at 4+/5.  Sensation is intact.  HOSPITAL COURSE:  [name] was admitted to  rehab on August 28, 2011, for inpatient therapies to consist of PT, OT and at least 3 hours 5 days a week.  Past admission physiatrist, rehab RN and therapy team have worked together to provide customized collaborative interdisciplinary care.  The patient's blood pressures were monitored on b.i.d. basis.  Low-dose ACE was added for better blood pressure control. At time of discharge, blood pressures are ranging from low 433I-951O systolics, 84Z-66A diastolic with occasional high in systolics 630Z. The patient is to follow up with LMD regarding further titration of her BP meds.  Due to elevated hemoglobin A1c, she was started on carb- modified diet.  CBGs were checked on a.c. and at bedtime  basis. Currently, blood sugars are ranging from 80-110 range.  The patient has been educated by dietitian as well as rehab RN regarding carb-modified diet as well as need for increasing activity to help with her blood sugar stabilization.  The patient was noted to have issues with spasms of left lower extremity especially in the evenings.  She was started on low-dose baclofen and was able to tolerate this.  As she continues to have issues with spasticity, baclofen was increased to 10 mg p.o. nightly and this has been effective in controlling her symptoms.  The patient was noted to have a mild anemia with low iron stores at 50 and she was started on iron supplements to help improve this.  Labs past admission on October 29, 2010, reveals hemoglobin 10.6, hematocrit 33.9, white count 8.5 and platelets 212.  Check of lytes revealed sodium 135, potassium 3.6, chloride 98, CO2 28, BUN 7, creatinine 0.87 and glucose 141.  Check of LFTs revealed AST at 39, ALT at 21, alkaline phos at 75, albumin at 3.5.  A UA/UC was done past admission, but urine culture showing 70,000 colonies of Klebsiella pneumonia and the patient was treated with Cipro for this.  Therapies were initiated and the patient was noted to be  motivated and has been progressing well with therapies.  She did develop left ankle pain with increased activity and x-rays of left ankle on November 02, 2010, revealed an osteochondral lesion and medial talus and a small calcaneal spur with soft tissue swelling.  An Aircast was ordered to help with left ankle instability due to 2+ pitting edema as well as weakness in left ankle plantar dorsiflexion.  This has helped in her symptomatology.  The patient will follow up with Dr. Wynelle Link past discharge regarding further workup of osteochondral lesion to include MRIs indicated.  During the patient's stay in rehab, weekly team conferences were held to monitor the patient's progress, set goals as well as discuss barriers to discharge.  At time of admission, the patient was noted to be impaired due to her left-sided weakness as well as her body habitus.  She was  noted to have decrease gait speed as well as impairments in balance with Berg score at 43/56.  The patient required min assist for transfers, min assist for ambulating short distances and +2 assist to navigate 3 steps. Physical Therapy has been working with the patient on lower extremity neuromuscular reeducation exercises as well as gait and balance.  Berg score has improved to 46/56, however, she remains at moderate fall risk. She has progressed to being at modified independent level for transfers, modified independent level for ambulating short distances in a supervised setting with rolling walker.  She requires supervision to navigate 3 steps.  Family education has been done with sons to include supervision for safety.  OT has been working with the patient on self- care tasks as well as neuro reeducation of left upper extremity.  The patient is left-handed and OT has been working with the patient on improving use of left upper extremity for self-care tasks.  By the time of discharge, the patient has progressed to being independent  level for all bathing, dressing as well as toileting tasks.  She is using her left upper extremity as dominant upper extremity.  Further followup home health PT, OT to continue by [facility] past discharge.  The patient has also been advised to follow up with Dr. Gwenlyn Found for [facility] or other cardiac workup as indicated.  Sleep lab at [facility]'s to contact her on Monday regarding sleep study.  On November 05, 2010, the patient is discharged to home.  DISCHARGE MEDICATIONS: 1. Baclofen 10 mg half to one p.o. q.5 p.m. for spasms. 2. Plavix 75 mg a day. 3. Diclofenac gel to left ankle q.i.d. basis. 4. Nu-Iron 150 mg a day. 5. Lisinopril 5 mg a day. 6. Senokot-S 2 p.o. nightly p.r.n. constipation. 7. Simvastatin 40 mg p.o. per day. 8. Align one p.o. per day. 9. Cymbalta 30 mg p.o. q.p.m. starting Saturday November 06, 2010. 10.Mucinex 600 mg half p.o. b.i.d. p.r.n. 11.Omega-3 one p.o. per day. 12.Synthroid 75 mcg a day. 13.Vitamin D2 50,000 units p.o. twice a week.  DIET:  Carb-modified medium.  ACTIVITY LEVEL:  Intermittent supervision, use walker for ambulation.  SPECIAL INSTRUCTIONS:  To not use lisinopril/hydrochlorothiazide 20/25 or aspirin.  No alcohol, no smoking, no driving.  [place] to provide PT, OT.  Sleep lab to call you on Monday, if call not received call [phone] for date and time.  Continue carb-modified diet.  FOLLOWUP:  The patient to follow up with Dr. Letta Pate December 20, 2010, 1:30-2:00 p.m.  Follow up with Dr. Leonie Man in 6-8 weeks.  Follow up with Dr. Gwenlyn Found, Cardiology for stress test.  Follow up with Dr. Regis Bill for routine check in 2 weeks.  Follow up with Dr. Wynelle Link for input on lesion left ankle.     Reesa Chew, P.A.     PL/MEDQ  D:  11/05/2010  T:  11/06/2010  Job:  [id number]  cc:   Standley Brooking. Regis Bill, MD Quay Burow, M.D. Alexis Goodell, MD Gaynelle Arabian, M.D.  Electronically Signed by Joline Maxcy. on 11/15/2010  03:00:32 PM Electronically Signed by Alysia Penna M.D. on 11/19/2010 09:44:15 AM

## 2010-11-19 NOTE — Assessment & Plan Note (Signed)
Her blood pressure has crept back up since been taken off medication in the hospital for her stroke and was only given  lisinopril 5 mg now taking twice a day. She can go back on lisinopril HCTZ 20/25 one by mouth daily and slowly add back the atenolol 50 mg first a half at night and can increase to a whole. We want to avoid hypotension.   she has lost 20 pounds I want her to continue doing this.

## 2010-11-19 NOTE — Patient Instructions (Signed)
Have OT practice getting in and out of car  And at that point can do limited driving. Restart the lisinopril hctz 20/ 25   Each day   Then can add 1/2 of atenolol   Qd    And monitor BP readings return office visit in 1 month or as needed .

## 2010-11-19 NOTE — Assessment & Plan Note (Signed)
She is recovering quite well she had an echocardiogram showing 60-65% ejection LVH with calcified mitral annulus but no obstruction. Angiogram showed left ICA filling defect not correlating with her right frontal lobe stroke.   He is to undergo a cardiac stress test and a sleep study to get obstructive sleep apnea diagnosis. She has continued rehabilitation at home for another one or 2 weeks. She is to see the neurologist Dr. Berna Spare in a few months and the rehabilitation doctor or in April.

## 2010-11-19 NOTE — Assessment & Plan Note (Signed)
He is taking medicine periodically high dose we'll have to review her record about further recommendations. It would be best if she could get on over-the-counter supplements and not prescription strength.

## 2010-11-21 ENCOUNTER — Encounter: Payer: Self-pay | Admitting: Internal Medicine

## 2010-11-21 DIAGNOSIS — I517 Cardiomegaly: Secondary | ICD-10-CM | POA: Insufficient documentation
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
## 2010-11-24 LAB — CBC
MCHC: 32.5 g/dL (ref 30.0–36.0)
MCV: 89.5 fL (ref 78.0–100.0)
Platelets: 189 10*3/uL (ref 150–400)
Platelets: 207 10*3/uL (ref 150–400)
RBC: 3.39 MIL/uL — ABNORMAL LOW (ref 3.87–5.11)
RDW: 15.6 % — ABNORMAL HIGH (ref 11.5–15.5)
WBC: 8.6 10*3/uL (ref 4.0–10.5)
WBC: 9.5 10*3/uL (ref 4.0–10.5)

## 2010-11-24 LAB — BASIC METABOLIC PANEL
BUN: 14 mg/dL (ref 6–23)
Creatinine, Ser: 0.85 mg/dL (ref 0.4–1.2)
GFR calc non Af Amer: >60 mL/min (ref >60)
Glucose, Bld: 137 mg/dL — ABNORMAL HIGH (ref 70–99)

## 2010-11-24 LAB — PROTIME-INR
INR: 1.96 — ABNORMAL HIGH (ref 0.00–1.49)
Prothrombin Time: 17.7 seconds — ABNORMAL HIGH (ref 11.6–15.2)
Prothrombin Time: 22.2 seconds — ABNORMAL HIGH (ref 11.6–15.2)

## 2010-11-28 LAB — URINALYSIS, ROUTINE W REFLEX MICROSCOPIC
Glucose, UA: NEGATIVE mg/dL
Hgb urine dipstick: NEGATIVE
Specific Gravity, Urine: 1.012 (ref 1.005–1.030)
pH: 7 (ref 5.0–8.0)

## 2010-11-28 LAB — PROTIME-INR
INR: 1.04 (ref 0.00–1.49)
Prothrombin Time: 12.8 seconds (ref 11.6–15.2)

## 2010-11-28 LAB — CBC
Hemoglobin: 12 g/dL (ref 12.0–15.0)
Platelets: 255 10*3/uL (ref 150–400)
RDW: 15.3 % (ref 11.5–15.5)
RDW: 15.6 % — ABNORMAL HIGH (ref 11.5–15.5)

## 2010-11-28 LAB — BASIC METABOLIC PANEL
BUN: 22 mg/dL (ref 6–23)
Calcium: 8.2 mg/dL — ABNORMAL LOW (ref 8.4–10.5)
GFR calc non Af Amer: 54 mL/min — ABNORMAL LOW (ref >60)
Glucose, Bld: 155 mg/dL — ABNORMAL HIGH (ref 70–99)

## 2010-11-28 LAB — COMPREHENSIVE METABOLIC PANEL
ALT: 12 U/L (ref 0–35)
AST: 20 U/L (ref 0–37)
Albumin: 3.5 g/dL (ref 3.5–5.2)
Alkaline Phosphatase: 87 U/L (ref 39–117)
Calcium: 9 mg/dL (ref 8.4–10.5)
GFR calc Af Amer: >60 mL/min (ref >60)
Glucose, Bld: 144 mg/dL — ABNORMAL HIGH (ref 70–99)
Potassium: 4 mEq/L (ref 3.5–5.1)
Sodium: 139 mEq/L (ref 135–145)
Total Protein: 7 g/dL (ref 6.0–8.3)

## 2010-11-28 LAB — TYPE AND SCREEN
ABO/RH(D): O POS
Antibody Screen: NEGATIVE

## 2010-12-02 NOTE — H&P (Signed)
[name], LAWLEY NO.:  [id number]  MEDICAL RECORD NO.:  [id number]           PATIENT TYPE:  E  LOCATION:  [facility]                         FACILITY:  [facility]  PHYSICIAN:  Sheila Oats, M.D.DATE OF BIRTH:  1951/01/21  DATE OF ADMISSION:  10/24/2010 DATE OF DISCHARGE:                             HISTORY & PHYSICAL   PRIMARY CARE PHYSICIAN:  Standley Brooking. Panosh, MD.  CHIEF COMPLAINT:  Left-sided weakness.  HISTORY OF PRESENT ILLNESS:  The patient is a morbidly obese 60 year old left handed female with past medical history significant for hypertension, who presents with the above complaints.  She states that she was in her usual state of health until about 2:30 a.m. this morning, when she began experiencing left leg numbness.  She states that she called EMS, and they came, examined her, and said her vital signs were fine and so did not transport her to the ED.  She went back to bed, but felt like she was unable to move her left leg and ended up falling out of bed.  She also states that she was unable to write using her left hand (as noted above, she is left-handed) due to weakness in her left upper extremity.  She then called her family and was brought to the ED. She denies headaches, facial weakness, slurred speech, dysphagia, blurred vision, dysuria, fevers, cough, diarrhea, melena, and no hematochezia.  She also denies chest pain.  She did not report any shortness of breath, but per ED staff, she was noted to have expiratory wheezing on exam and was given a nebulized bronchodilator.  In the ED, a CT scan of her head was done, which showed no acute intracranial findings, aligned for extensive patient motion.  A chest x- ray showed cardiomegaly with central vascular congestion, but no focal findings.  Urinalysis was negative for infection, and she is admitted for further evaluation and management.  PAST MEDICAL HISTORY: 1. As above. 2. Hypothyroidism. 3.  Osteoarthritis and status post bilateral knee surgeries as well as     right shoulder arthroplasty. 4. History of osteoporosis.  MEDICATIONS: 1. Atenolol 50 mg p.o. daily. 2. Lisinopril/HCTZ 20/25 mg p.o. daily. 3. Synthroid 75 mcg p.o. daily. 4. Cymbalta 30 mg p.o. daily. 5. Aspirin 81 mg daily.  ALLERGIES:  TOPICAL BENADRYL.  SOCIAL HISTORY:  She denies tobacco, occasional alcohol.  FAMILY HISTORY:  Her mother had carotid artery disease and had carotid endarterectomy.  Her dad had hypertension and died of ? liver cancer and lymphoma.  Uncle had a stroke.  REVIEW OF SYSTEMS:  As per HPI, other review of systems are negative.  PHYSICAL EXAMINATION:  GENERAL:  The patient is a morbidly-obese, older white female, nonlabored respirations, no facial asymmetry. VITAL SIGNS:  Temperature is 98.1 with a blood pressure of 142/55, pulse 105, respiratory rate 24, O2 sat 96%. HEENT:  PERRL, EOMI, sclerae anicteric, moist mucous membranes.  No oral exudates.  No facial asymmetry.  Normocephalic, atraumatic. NECK:  Supple, no adenopathy, no thyromegaly, no JVD.  No carotid bruits are appreciated. LUNGS:  Few scattered expiratory wheezes, no crackles. CARDIOVASCULAR:  Regular rate and rhythm.  Normal S1, S2.  No S3 appreciated. ABDOMEN:  Obese, bowel sounds are present, nontender, nondistended.  No organomegaly and no masses palpable. EXTREMITIES:  No cyanosis and no edema. NEUROLOGIC:  She is drowsy, easily aroused, and oriented x3.  Cranial nerves II through XII are grossly intact.  Strength on the left side is 3/5 and withdraws toes to plantar stimulation on the left.  On the right, her strength is 4-5/5 and right toe downgoing to plantar stimulation.  Sensory grossly intact.  LABORATORY DATA:  Imaging studies; as per HPI.  Sodium is 134 with a potassium of 4.3, chloride is 94, CO2 of 27, glucose is 135, BUN of 22, creatinine 0.99, calcium is 9, total protein is 7.4, albumin is 3.7,  AST is 28, ALT is 16.  WBC count is 11.1, hematocrit is 35.3, platelet count is 283, MCV is 81.5.  Urinalysis is negative for infection.  ASSESSMENT AND PLAN: 1. Left-sided weakness -- with left lower extremity paresthesias.  As     discussed above, we will proceed with cerebrovascular accident     workup.  Neurology consulted per ED, awaiting evaluation.  The     patient has been on aspirin outpatient.  We will place on Plavix     for now, and follow above workup. 2. Hypertension -- monitor blood pressures and treat accordingly. 3. ? Shortness of breath -- chest x-ray with cardiomegaly and vascular     congestion.  We will obtain a brain natriuretic peptide and a 2-D     echocardiogram, p.r.n. nebs. 4. Hypothyroidism -- continue Synthroid. 5. Anemia -- follow recheck and further workup accordingly.     Sheila Oats, M.D.     ACV/MEDQ  D:  10/24/2010  T:  10/24/2010  Job:  [id number]  cc:   Standley Brooking. Regis Bill, MD  Electronically Signed by Minette Headland M.D. on 12/02/2010 10:18:39 AM

## 2010-12-08 HISTORY — PX: OTHER SURGICAL HISTORY: SHX169

## 2010-12-13 ENCOUNTER — Ambulatory Visit (HOSPITAL_BASED_OUTPATIENT_CLINIC_OR_DEPARTMENT_OTHER): Payer: BC Managed Care – PPO | Attending: Otolaryngology

## 2010-12-13 DIAGNOSIS — G4733 Obstructive sleep apnea (adult) (pediatric): Secondary | ICD-10-CM | POA: Insufficient documentation

## 2010-12-13 DIAGNOSIS — I4949 Other premature depolarization: Secondary | ICD-10-CM | POA: Insufficient documentation

## 2010-12-13 DIAGNOSIS — R0989 Other specified symptoms and signs involving the circulatory and respiratory systems: Secondary | ICD-10-CM | POA: Insufficient documentation

## 2010-12-13 DIAGNOSIS — R0609 Other forms of dyspnea: Secondary | ICD-10-CM | POA: Insufficient documentation

## 2010-12-13 DIAGNOSIS — I491 Atrial premature depolarization: Secondary | ICD-10-CM | POA: Insufficient documentation

## 2010-12-19 DIAGNOSIS — R0989 Other specified symptoms and signs involving the circulatory and respiratory systems: Secondary | ICD-10-CM

## 2010-12-19 DIAGNOSIS — I4949 Other premature depolarization: Secondary | ICD-10-CM

## 2010-12-19 DIAGNOSIS — G4733 Obstructive sleep apnea (adult) (pediatric): Secondary | ICD-10-CM

## 2010-12-19 DIAGNOSIS — R0609 Other forms of dyspnea: Secondary | ICD-10-CM

## 2010-12-19 DIAGNOSIS — I491 Atrial premature depolarization: Secondary | ICD-10-CM

## 2010-12-19 NOTE — Procedures (Signed)
NAME:  [name], [name] NO.:  [id number]  MEDICAL RECORD NO.:  [id number]         PATIENT TYPE:  OUT  LOCATION:  SLEEP CENTER                 [facility]ACILITY[facility]  [facility]  PHYSICIAN:  Ravin Denardo D. Annamaria Boots, MD, FCCP, FACPDATE OF BIRTH:  01-[birth year]-1948  DATE OF STUDY:  12/13/2010                           NOCTURNAL POLYSOMNOGRAM  REFERRING PHYSICIAN:  JAMES J CROSSLEY  INDICATIONS FOR STUDY:  Hypersomnia with sleep apnea.  Epworth sleepiness score 2/24, BMI is 54.9, weight 300 pounds, height 62 inches, and neck 16 inches.  HOME MEDICATIONS:  Charted and reviewed.  SLEEP ARCHITECTURE:  Total sleep time 218 minutes with sleep efficiency 60.6%.  Stage I was 39.9%, stage II was 60.1%, stages III and REM were absent.  Sleep latency 42.5 minutes, awake after sleep onset 90 minutes, arousal index 107.3.  BEDTIME MEDICATIONS:  Atenolol, Cymbalta, Zocor, baclofen, Mucinex, and Advil.  RESPIRATORY DATA:  Apnea/hypopnea index (AHI) 114.2 per hour.  A total of 415 events were scored including 210 obstructive apneas and 205 hypopneas.  Events were not positional.  REM was absent.  The patient was not able to sustain sleep adequately within the initial hours of the night to meet protocol criteria for initiation of CPAP titration by split protocol on this study night.  OXYGEN DATA:  Moderately loud snoring with oxygen desaturation to a nadir of 74% and a mean oxygen saturation through the study of 86% on room air.  CARDIAC DATA:  Sinus rhythm with frequent PACs and occasional PVCs.  MOVEMENT/PARASOMNIA:  No significant movement disturbance meeting scoring criteria.  On review of the video tape, there were very frequent position movements throughout the study.  No bathroom trips.  IMPRESSION/RECOMMENDATIONS: 1. Severe obstructive sleep apnea/hypopnea syndrome, AHI 114.2 per     hour with nonpositional events, moderately loud snoring and oxygen     desaturation to a nadir of 74% on room  air. 2. Because she was unable to meet sustained sleep requirements of the     protocol, she was not begun on CPAP titration by split protocol on     this study night.  Consider return for dedicated CPAP titration     study or evaluate for alternative management as clinically     appropriate. 3. Oxygen saturation on arrival while awake was 97% on room air.  She     desaturated on room air to a nadir of 74% and maintained a mean     oxygen saturation through the sleep study of 86% on room air.  This     probably reflects her obstructive apnea.  Consider reevaluation of     overnight oximetry after her sleep apnea is treated, to determine     if there is ongoing need for supplemental oxygen during sleep. 4. She did not have periodic limb movement with arousal by formal     scoring criteria.  However, she had a great deal of arm and leg     movement throughout the study noted as minor events on video.  This     may be secondary to arousal stimulus from her apnea.     Sujey Gundry D. Annamaria Boots, MD, FCCP, [place], American  Board of Sleep Medicine Electronically Signed    CDY/MEDQ  D:  12/19/2010 13:17:05  T:  12/19/2010 43:32:95  Job:  [id number]

## 2010-12-20 ENCOUNTER — Ambulatory Visit: Payer: BC Managed Care – PPO | Admitting: Internal Medicine

## 2010-12-20 ENCOUNTER — Encounter: Payer: BC Managed Care – PPO | Attending: Physical Medicine & Rehabilitation

## 2010-12-20 ENCOUNTER — Inpatient Hospital Stay: Payer: BC Managed Care – PPO | Admitting: Physical Medicine & Rehabilitation

## 2010-12-20 DIAGNOSIS — M7989 Other specified soft tissue disorders: Secondary | ICD-10-CM | POA: Insufficient documentation

## 2010-12-20 DIAGNOSIS — I69998 Other sequelae following unspecified cerebrovascular disease: Secondary | ICD-10-CM

## 2010-12-20 DIAGNOSIS — R29898 Other symptoms and signs involving the musculoskeletal system: Secondary | ICD-10-CM | POA: Insufficient documentation

## 2010-12-20 DIAGNOSIS — I10 Essential (primary) hypertension: Secondary | ICD-10-CM | POA: Insufficient documentation

## 2010-12-20 DIAGNOSIS — I69959 Hemiplegia and hemiparesis following unspecified cerebrovascular disease affecting unspecified side: Secondary | ICD-10-CM | POA: Insufficient documentation

## 2010-12-20 DIAGNOSIS — D518 Other vitamin B12 deficiency anemias: Secondary | ICD-10-CM

## 2010-12-20 DIAGNOSIS — E039 Hypothyroidism, unspecified: Secondary | ICD-10-CM | POA: Insufficient documentation

## 2010-12-20 DIAGNOSIS — R209 Unspecified disturbances of skin sensation: Secondary | ICD-10-CM

## 2010-12-20 DIAGNOSIS — Z96659 Presence of unspecified artificial knee joint: Secondary | ICD-10-CM | POA: Insufficient documentation

## 2010-12-20 DIAGNOSIS — G8110 Spastic hemiplegia affecting unspecified side: Secondary | ICD-10-CM

## 2010-12-20 MED ORDER — CYANOCOBALAMIN 1000 MCG/ML IJ SOLN
1000.0000 ug | Freq: Once | INTRAMUSCULAR | Status: AC
  Administered 2010-12-20: 1000 ug via INTRAMUSCULAR

## 2010-12-21 NOTE — Assessment & Plan Note (Signed)
CHIEF COMPLAINT:  Left ankle swelling.  A 60 year old female who has had CVA, right frontal infarct, left hemiparesis, lower extremity greater than upper extremity.  She underwent stroke workup including Doppler showing no significant ICA stenosis.  A 2-D echo showing normal ejection fraction and calcified mitral annulus, no source of embolism.  Neurology evaluated the patient, recommended Plavix for CVA prophylaxis.  The patient had bilateral lower extremity Dopplers, negative for DVT during her hospitalization in February.  She is admitted to the Neuro Rehab Unit at [facility] October 28, 2010, stayed there until November 05, 2010.  PAST SURGICAL HISTORY:  Right knee replacement in 2007, left total knee replacement in 2009.  She had problems with her left ankle.  X-ray showed an osteochondral lesion at the medial talus.  Small calcaneal spur, air cast ordered.  The patient really not complaining much of ankle pain anymore just really the swelling and this is more the soft tissue swelling over the entire foot and ankle area.  She has this to the lesser extent on the right side as well.  She has finished up with home health therapy.  She is ambulating independently, sometimes uses a cane of her left ankle feels extra swollen.  We discussed outpatient therapy, but the patient was not interested.  Her primary care physician is Dr. [name].  The patient has seen [name] for her ankle as well.  X-ray once again was negative, started on Lyrica.  She has not returned to work in [employer] yet.  She has started some driving.  Her walking tolerance is 50 minutes.  She can climb steps.  REVIEW OF SYSTEMS:  Positive for wheezing, no shortness of breath.  Pain score is 0-1/10 mainly in her back area.  PAST HISTORY:  Hypertension, hypothyroidism.  PAST SURGICAL HISTORY:  Total knee replacement in left side 2001, right side 2006; revision of the left knee in 2008 and 2011; total right shoulder  2011.  SOCIAL HISTORY:  Divorced.  FAMILY HISTORY:  Heart disease, lung disease and hypertension.  PHYSICAL EXAMINATION:  VITAL SIGNS:  Blood pressure 135/58, pulse 83, respirations 18 and O2 sat 96% on room air. GENERAL:  She is a obese female, in no acute distress.  Orientation x3. Affect is alert.  Gait is using a cane, although she can carry this without loss of balance. EXTREMITIES:  She does have some valgus deformity bilateral knee and she shows no evidence of toe drag or knee instability.  She does have pitting edema bilateral ankles, more so on the left than on the right. There is no tenderness to palpation.  No erythema in the lower extremities.  She has normal pulses.  Her sensation is normal in the lower extremities.  She does have mild ankle dorsiflexion, plantar flexion and weakness in the left lower extremity on motor testing and left upper extremity.  She has 4/5 in the deltoid, biceps, triceps and grip.  IMPRESSION: 1. Right frontal cerebrovascular accident primarily affecting left     lower extremity.  She has mainly left lower extremity weakness.  I     believe her swelling is related to her lower extremity weakness,     appears to be dependent edema.  She states that it clears up in the     morning when she wakes up and is worse in the evening when she goes     to bed.  I have recommended that she wears support hose and keeps  the leg elevated today when she is sitting.  She can follow up     medically with her primary care physician.  She is on     hydrochlorothiazide.  May benefit from another diuretic, but leave     this up to Dr. Regis Bill. 2. I will see her back in about 6 weeks.  We will talk about getting     back to work which she would like to be able to do.  Her main     concern is going up and down steps that she has to do in her work     place to go to the bathroom.  She once again does not want to go     through anymore outpatient rehab.  I did  indicate that it maybe     helpful for some of her residual gait disturbance and activity     intolerance.     Charlett Blake, M.D. Electronically Signed    AEK/MedQ D:  12/20/2010 16:16:05  T:  12/21/2010 02:51:03  Job #:  [id number]  cc:   Standley Brooking. Regis Bill, MD [place],  [id number]  Pramod P. Leonie Man, MD Fax: [phone]

## 2011-01-18 NOTE — Op Note (Signed)
[name], [name] NO.:  [id number]   MEDICAL RECORD NO.:  [id number]          PATIENT TYPE:  INP   LOCATION:  [facility]                         FACILITY:  [facility]   PHYSICIAN:  Gaynelle Arabian, M.D.    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   PROCEDURE:  Revision of tibial and patellar components, left knee.   SURGEON:  Aluisio.   ASSISTANT:  Arlee Muslim, PA-C   ANESTHESIA:  General with postop Marcaine pain pump.   ESTIMATED BLOOD LOSS:  500.   DRAINS:  Hemovac x1.   TOURNIQUET TIME:  86 minutes at 350 mmHg.   COMPLICATIONS:  None.   CONDITION.:  Stable to recovery.   INDICATIONS FOR PROCEDURE:  The patient is a 60 year old female who had  a left total knee arthroplasty done by Dr. Tommie [name]  in 2001.  Did well  until a few years ago when she had progressive pain in the tibia.  Her  exam was consistent with having a loose tibial component and x-rays also  suggested this.  She has a history of osteopetrosis and thus has  abnormally dense bone.  It was felt that due to this condition the  cement loosened.  She presents now for tibial burst total knee revision.   PROCEDURE IN DETAIL:  After the successful administration of general  anesthetic, a tourniquet was placed high on her left thigh and left  lower extremity prepped and draped in the usual sterile fashion.  Extremities wrapped in Esmarch, knee flexed, and tourniquet inflated to  350 mmHg.  Midline incision was made with a 10 blade through  subcutaneous tissue to the level of the extensor mechanism.  A fresh  blade was used make a medial parapatellar arthrotomy.  She had a fair  amount of hypertrophic synovitis consistent with particulate  disease.  This was all debrided back to healthy tissue.  Patella was then subluxed  laterally and soft tissue of the proximal medial tibia subperiosteally  elevated around the joint line.  The tibial polyethylene is worn.  This  was easily removed by subluxing the tibia forward.  I inspected the  femoral component and it appeared solid, with no evidence of any de-  bonding between the cement and prosthesis were cemented in bone.  On  tibial side, I just put an osteotome up underneath the component and it  moved.  I was then able to easily disrupt the interface between the  prosthesis and bone and easily remove the prosthesis with no bone loss.  There was membranous material underneath the component.  This was all  debrided back to normal-looking bone.  I then removed the cement  proximally from the canal.  Her canal basically is nonexistent.  She has  cortical bone all the way through.  I then used a high-speed burr to  create a 10-mm canal such that it would fit a 10 x 75 stem.  We kept  reaming the canal and then trialing with a 2.5, with 10 x 75 stem  extension.  Once I got to the appropriate depth in the tibia, then we  prepared proximally both with a high-speed burr and with the proximal  reamer.  Her size of the tibial component was 2.5 MBT revision tray.  She had a 20 mm thickness insert to start.  I then placed the  extramedullary cutting guide and referencing proximally at medial aspect  of tibial tubercle and distally along the second metatarsal axis and  tibial crest, I cut the proximal tibia to a flush bony surface.  I again  placed a trial.  Given the she had a 20 mm insert to begin with, I  decided to put 10 mm augments medially and laterally.  With the trial  and the 10 mm augments, we had excellent fit on the bone and the stem  was then the created canal centrally.  The keel was punched and then I  placed a 15-mm insert.  Full extension was achieved with excellent varus-   valgus, anterior-posterior balance throughout full range of motion.  Patella was inspected and the polyethylene had some wear so I switched  out to a new polyethylene on a metal backed LCS patella.  I then let the  tourniquet down for total time of 86 minutes.  The bleeding was actually  less with the tourniquet down than with the tourniquet up.  We held the  tourniquet down while we assembled the component on the back table.  Once again, it is a 2.5 MBT revision tray with a 10 x 75 Press-Fit stem  extension.  Once the component was readied and we thoroughly irrigated  the tibia and the joint, then mixed the cement.  Once the cement was  ready for implantation, then we cemented proximally, had the Press-Fit  distally.  We had excellent purchase in the bone proximally and  distally.  The trial 15-mm inserts were placed and knee held in full  extension and all extruded cement removed.  When the cement had fully  hardened, then we placed the permanent 15-mm LCS rotating platform  insert into the tibial tray.  The wound was then copiously irrigated  saline solution and the extensor mechanism closed over a Hemovac drain  with interrupted #1 PDS.  Flexion against gravity is 130 degrees.  At  that point, the calf and posterior thigh were touching.  Subcu was then  closed with interrupted 2-0 Vicryl and skin with staples.  A catheter  for the Marcaine pain pump was placed and the pump was initiated.  A  bulky sterile dressing was applied and she was awakened and transported  to recovery in stable condition.      Gaynelle Arabian, M.D.  Electronically Signed     FA/MEDQ  D:  07/21/2008  T:  07/22/2008  Job:  [id number]

## 2011-01-18 NOTE — H&P (Signed)
NAMEJAMAIA, [name] NO.:  [id number]   MEDICAL RECORD NO.:  [id number]          PATIENT TYPE:  INP   LOCATION:                               FACILITY:  [facility]   PHYSICIAN:  Gaynelle Arabian, M.D.    DATE OF BIRTH:  1951-01-29   DATE OF ADMISSION:  07/21/2008  DATE OF DISCHARGE:                              HISTORY & PHYSICAL   CHIEF COMPLAINT:  Chronic left total knee arthroplasty pain.   HISTORY OF PRESENT ILLNESS:  [name] is a 60 year old female who has  been evaluated Dr. Wynelle Link for chronic left total knee arthroplasty  pain.  The patient had the knee replaced in 2001.  The patient has been  having chronic left knee pain over the last several years, progressively  worsened with time, bothers her with weightbearing and nonweightbearing.  Evaluations and x-rays reveal questionable loosening of the components.  The patient has osteopetrosis of the bone.   ALLERGIES:  No known drug allergies.   CURRENT MEDICATIONS:  1. Lisinopril/hydrochlorothiazide 20/25 once a day.  2. Atenolol 50 mg a day.  3. Synthroid 75 mcg once a day.  4. Aleve daily.  5. Vitamin D once a week.   PAST MEDICAL HISTORY:  Includes  1. Obesity.  2. Hypertension.  3. Thyroid disease.  4. 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   REVIEW OF SYSTEMS:  Negative for any neurologic issues.  PULMONARY:  She  has had bronchitis in the past 15 years previous.  CARDIOVASCULAR:  Unremarkable for any chest pains, irregular heart rhythms.  No previous  stress test since.  GI:  Unremarkable.  GU:  Unremarkable.  ENDOCRINE:  Thyroid is well controlled with the current medications.   PAST SURGICAL HISTORY:  Includes eye surgery in 1952, left total knee  arthroplasty in 2001, right total knee arthroplasty in 2007, D and C in  2009.   FAMILY MEDICAL HISTORY:  Father is deceased from a heart attack and  complications of cancer.  Mother is alive with COPD and hypertension.   SOCIAL  HISTORY:  Patient is divorced.  She is a Tree surgeon. Never  smoked.  Occasional alcoholic beverage.  She lives alone in a [place]  house.  Her mother will be care Bentlee Benningfield for her on discharge.   PHYSICAL EXAM:  VITAL SIGNS:  Height is 5 feet 2, weight is 290, blood  pressure is 132/78, pulse of 78, respirations 12, patient is afebrile.  GENERAL:  This is a healthy-appearing, morbidly obese female, conscious,  alert and appropriate, appears to be a good historian.  HEENT: Head was normocephalic.  Gross hearing is intact.  NECK: Supple.  No palpable lymphadenopathy.  Good range of motion.  CHEST:  Lung sounds are clear and equal bilaterally.  HEART:  Regular rate and rhythm.  ABDOMEN:  Round, obese, soft.  Bowel sounds present.  EXTREMITIES:  Good range of motion, but she does have some discomfort  with her right shoulder.  Lower Extremities: Both hips are fully  extended.  She is able to flex them up to 90 degrees.  She has 20-30  degrees internal and external rotation without any discomfort.  Both  knees have well-healed midline surgical incisions.  She can fully extend  both knees and flex them back to 95 degrees.  She has no gross  instability.  Calves are soft.  Ankles have good motion.  PERIPHERAL VASCULAR: Carotid pulses are 2+, no bruits.  Radial pulses  2+. Dorsalis pedis pulses are 1+.  NEURO:  The patient is conscious, alert and appropriate.  Breast, rectal and GU exams are deferred at this time.   IMPRESSION:  1. 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  2. Morbid obesity.  3. Hypertension.  4. Thyroid disease.   PLAN:  The patient will undergo all routine labs and tests prior to  having a revision of her left total knee arthroplasty by Dr. Wynelle Link at  [facility] on July 21, 2008.      Evert Kohl, P.A.      Gaynelle Arabian, M.D.  Electronically Signed    RWK/MEDQ  D:  06/30/2008  T:  06/30/2008   Job:  [id number]   cc:   Gaynelle Arabian, M.D.  Fax: [phone]

## 2011-01-18 NOTE — Op Note (Signed)
NAMEPERINA, SALVAGGIO NO.:  [id number]   MEDICAL RECORD NO.:  [id number]          PATIENT TYPE:  AMB   LOCATION:  SDC                           FACILITY:  [facility]   PHYSICIAN:  M. Edwinna Areola, MD  DATE OF BIRTH:  30-Mar-1951   DATE OF PROCEDURE:  03/31/2008  DATE OF DISCHARGE:                               OPERATIVE REPORT   PREOPERATIVE DIAGNOSES:  5. A 60 year old G72P1 divorced white female with postmenopausal      bleeding.  2. Endometrial polyps noted on sonohysterogram.  3. Obesity.  4. Hypertension.  5. Hypothyroidism.   POSTOPERATIVE DIAGNOSES:  33. A 60 year old G1P1 divorced white female with postmenopausal      bleeding.  2. Endometrial polyps noted on sonohysterogram.  3. Obesity.  4. Hypertension.  5. Hypothyroidism.   PROCEDURE:  Hysteroscopy with dilation and curettage and polyp  resection.   SURGEON:  M. Edwinna Areola, MD   ASSISTANT:  OR staff.   ANESTHESIA:  LMA, Dr. Glennon Mac oversaw the case.   SPECIMENS:  Polyps and endometrial curettings sent to pathology.   ESTIMATED BLOOD LOSS:  Minimal.   FLUIDS:  1200 mL of LR.   FLUID DEFICIT:  For the hysteroscope the portion of the procedure, 65 mL  of 3% sorbitol.   URINE OUTPUT:  50 mL of clear urine drained with Foley catheter during  the procedure.   COMPLICATIONS:  None.   MEDICATIONS:  Ms. Mullinax is a very nice 60 year old G1P1 divorced white  female with a history of postmenopausal bleeding.  She was initially  evaluated in March for this.  An endometrial biopsy was performed office  which showed benign proliferative endometrium without hyperplasia or  malignancy and benign endocervical glands without dysplasia or  malignancy was noted.  The patient was started on cyclic progesterone  using Provera 10 mg a day, 1 through 12 of each month.  She returned in  three months and was continued to have some irregular bleeding that was  not at the time when a regular withdrawal bleed  should be.  A  sonohysterogram was therefore performed.  The uterus measured 9.9 x 4.6  x 6.1 cm with a 9.6-mm endometrium.  Sonohysterogram was performed and  the polyps were noted measuring 11 x 7 mm and 16 x 6 mm.  We discussed  the hysteroscopic removal of these polyps.  The risks and benefits were  discussed and are documented in the office chart.  The patient voiced  understanding and wished to proceed.   PROCEDURE:  The patient was taken to operating room.  She was placed in  supine position.  Anesthesia was administered by the anesthesia staff  without difficulty.  Running IV was in the left arm.  Informed consent  was present in our chart and in the hospital chart.  Legs were then  positioned in [place].  SCDs were in the lower extremities  bilaterally.  The legs were lifted to the high-lithotomy position.  The  perineum, inner thighs, the vagina were prepped with Betadine prep and a  normal sterile solution.  A red rubber Foley catheter was used to drain  the bladder of all urine.  The patient was then draped in normal sterile  fashion.   A bivalve Graves speculum was placed in the vagina.  The anterior lip of  the cervix was visualized.  The anterior lip of the cervix was grasped  with single-tooth tenaculum.  A paracervical block was placed in the  cervix using 10 mL of 1% lidocaine mixed 1:1 with epinephrine (1:100,000  units).  The 2.5 mL of this solution was instilled at the 3, 6, 9, and  12 o'clock positions.  This was a total of 10 mL.  The uterus was then  sounded to about 10.5 cm.  Using [facility] dilators, the cervix was dilated  up to #21.  Then, a 3.9-mm diagnostic hysteroscope was passed through  the endocervical canal.  Quite an angle to the cervix getting up to the  fundus of the uterus.  Endometrial polyps are noted and  photodocumentation is made of this as well as tubal ostia.  The scope  was removed.  Using polyp forceps, the polyps were removed with  several  passes.  Then, using a #1 smooth curette, the endometrial cavity was  curetted until rough gritty texture was noted.  The hysteroscope was  then passed back to the endocervical canal to the fundus and one polyp  was still noted to be present.  Otherwise, there was thin endometrium  present.  The scope was removed and with two additional passes of the  polyp forceps, this polyp was removed as well.  Revisualization of the  cavity revealed no polyps or other abnormalities.  Photodocumentation  was made.  At this point, all instruments were removed from the cervix.  The tenaculum was removed from the anterior lip of the cervix.  No  bleeding was noted.  The speculum was removed from the vagina.   This was placed before the scope was passed into the endocervical canal  initially.  Once the procedure was ended, sponge, lap, needle, and  instrument counts were correct x2.  The Betadine prep was washed from  the skin and the legs were positioned back in the supine position.  The  patient was awakened from anesthesia and extubated without difficulty.  She was taken to the recovery room in stable condition.      Felipa Emory, MD  Electronically Signed     MSM/MEDQ  D:  03/31/2008  T:  04/01/2008  Job:  [id number]

## 2011-01-18 NOTE — Assessment & Plan Note (Signed)
[name]                                 ON-CALL NOTE   [name], [name]                          MRN:          [id number]  DATE:03/01/2008                            DOB:          [birth year]    At 7:54 p.m.  Phone number is [phone].   OBJECTIVE:  The patient has cystitis-type symptoms with pressure and  urgency.  She does not have fever.  Went to the [place].  It  was crowded with people coughing and hacking, and she did not want to  stay in that atmosphere.  Her insurance required her to go to that  Urgent Care.  She did go to another one on [address], but it was  closed by the time she got there.  I tried to let her know that we do  not call in any antibiotics over the phone, suggested she may take Azo  overnight, since she is not having fever.  Drink lots of fluids and make  sure she is seen tomorrow first thing and can avoid the coughing and  hacking crowd.  Primary care Hughey Rittenberry is Dr. Regis Bill and home office  is  [place].     Stacy Charon, MD  Electronically Signed    RNS/MedQ  DD: 03/01/2008  DT: 03/02/2008  Job #: [id number]

## 2011-01-18 NOTE — Assessment & Plan Note (Signed)
[facility] OFFICE NOTE   [name], [name]                        MRN:          [id number]  DATE:01/23/2007                            DOB:          02-15-1951    NEW PATIENT VISIT:   CHIEF COMPLAINT:  New patient to get established comes with 2 inches of  records not available before this office visit.   HISTORY OF PRESENT ILLNESS:  [name] [name] is a 60 year old nonsmoking,  divorced white female, mother of 1, who comes in today for her first-  time visit.  Her previous physician was Dr. Gracy Bruins, but because of her  insurance change, it has been difficult for her to pay for her regular  care through her previous physicians, so she is established a new.  Her  medical problems include hypertension, longstanding, on medication,  apparently well-controlled, and hypothyroidism, on medication with no  recent changes.  She also has significant problems related to orthopedic  issues, degenerative joint disease and has had 2 knee replacements and  had a remote history of being in a chronic pain clinic with Dr. Hardin Negus  for back pain and included narcotics and injections; she significantly  improved after that time and when she tried to reestablish after her  surgery for her knee with recurrence of her back pain, there were some  difficulties because her urine test apparently had codeine in it, which  was legally prescribed by Dr. Gracy Bruins.  Since that time, she only  really uses the hydrocodone 7.5/325 a half at night, but prefers to take  non-narcotic medicines for this problem, but she also had been under  care with water therapy and tries to continue that activity.  She  believes her last checkup was in July of last year, at which point lab  tests were done.  She is trying to lose weight and is meditating other  things including a lap band, but is not quite ready for that.  She does  not think she has sleep apnea  or other problems related to her  significant obesity.   PAST MEDICAL HISTORY:  See data base:  1. Arthritis.  2. Hypertension.  3. Thyroid disease.   SURGERIES:  1. Total left knee 2000, right 2007.  2. Pregnancy in 1976.  3. 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   She is a gravida 1, para 1.  Last Pap, July 2007.  Last mammogram, June  2007.  Tetanus shot 5 or 6 years ago.  Colonoscopy 2 years ago.   MEDICATIONS:  1. Lisinopril/hydrochlorothiazide 20/25 one p.o. daily.  2. Atenolol 50 mg a day.  3. Synthroid 0.075 a day.  4. Hydrocodone/APAP 750/325 p.r.n.  5. Had been on muscle relaxer of unknown name.   DRUG ALLERGIES:  TOPICAL BENADRYL.   FAMILY HISTORY:  Father eventually died of cancer in his liver, but had  history of type 2 diabetes and had a tumor associated with liver;  mother, hypertension, COPD, osteoporosis; brother died, MVA  complications; hypertension in brother and sister.   SOCIAL HISTORY:  Household of 1.  Does have a pet cat.  Social alcohol.  No tobacco.  Does go to the pool for water walking and water exercise.  Employed in [employer].  Six to 8 hours of sleep a night.   PREVIOUS PHYSICIAN:  Dr. Jonny Ruiz.   SPECIALIST:  Dr. Telford Nab.   REVIEW OF SYSTEMS:  Negative for chest pain or shortness of breath that  is new to her.  Back pain and knee pain as above.  No neurologic  symptoms at visit today.  Rest of ROS noncontributory.   OBJECTIVE:  Height 5 feet 1-1/2 inches, weight 315 pounds.  Pulse 66 and  regular, blood pressure 120/70.  This is a WDWN healthy-appearing middle-age lady in no acute distress.  HEENT:  Grossly normal.  OP clear.  NECK:  Without mass, thyromegaly or bruit.  CHEST:  CTAB and equal.  CARDIAC:  S1 and S2.  I do hear a murmur, a 2/6 systolic murmur, at the  left upper sternal border; it does not radiate into the neck.  There are  no clicks.  S2 seems to be normal and there is normal upstroke.  Peripheral pulses are  present without delay, noted.  ABDOMEN:  Soft, large, without organomegaly, guarding or rebound.  NEUROLOGIC:  Appeared grossly intact.  SKIN:  Anicteric.   IMPRESSION:  1. Hypertension.  2. Hypothyroidism.  3. Degenerative joint disease, status post bilateral knee replacements      and problematic back pain most recently, requiring narcotics as      needed, probably activated by her significant morbid obesity.  4. Heart murmur found on exam; this could be functional.  She is not      aware of anyone having heard this before, but we will review her      records and follow up as appropriate or at her complete physical      examination at the end of July.  I refilled her medications today      including #40 of her hydrocodone 7.5./325 to use as needed.  5. Morbid obesity:  We did discuss this and options.  It is imperative      that she agree to deal      with this, as it is contributing to her medical problems at present      and she is fully aware of this and plans some intervention.     Standley Brooking. Panosh, MD  Electronically Signed    WKP/MedQ  DD: 01/23/2007  DT: 01/24/2007  Job #: [id number]

## 2011-01-21 ENCOUNTER — Ambulatory Visit (INDEPENDENT_AMBULATORY_CARE_PROVIDER_SITE_OTHER): Payer: BC Managed Care – PPO | Admitting: Internal Medicine

## 2011-01-21 ENCOUNTER — Encounter: Payer: Self-pay | Admitting: Internal Medicine

## 2011-01-21 VITALS — BP 110/70 | HR 88 | Wt 315.0 lb

## 2011-01-21 DIAGNOSIS — I639 Cerebral infarction, unspecified: Secondary | ICD-10-CM

## 2011-01-21 DIAGNOSIS — D518 Other vitamin B12 deficiency anemias: Secondary | ICD-10-CM

## 2011-01-21 DIAGNOSIS — I482 Chronic atrial fibrillation, unspecified: Secondary | ICD-10-CM

## 2011-01-21 DIAGNOSIS — D649 Anemia, unspecified: Secondary | ICD-10-CM

## 2011-01-21 DIAGNOSIS — G4733 Obstructive sleep apnea (adult) (pediatric): Secondary | ICD-10-CM | POA: Insufficient documentation

## 2011-01-21 DIAGNOSIS — I6350 Cerebral infarction due to unspecified occlusion or stenosis of unspecified cerebral artery: Secondary | ICD-10-CM

## 2011-01-21 DIAGNOSIS — I10 Essential (primary) hypertension: Secondary | ICD-10-CM

## 2011-01-21 DIAGNOSIS — R7309 Other abnormal glucose: Secondary | ICD-10-CM

## 2011-01-21 DIAGNOSIS — I4891 Unspecified atrial fibrillation: Secondary | ICD-10-CM

## 2011-01-21 HISTORY — DX: Chronic atrial fibrillation, unspecified: I48.20

## 2011-01-21 MED ORDER — CYANOCOBALAMIN 1000 MCG/ML IJ SOLN
1000.0000 ug | Freq: Once | INTRAMUSCULAR | Status: AC
  Administered 2011-01-21: 1000 ug via INTRAMUSCULAR

## 2011-01-21 NOTE — H&P (Signed)
[name], CHEA NO.:  [id number]   MEDICAL RECORD NO.:  [id number]          PATIENT TYPE:  INP   LOCATION:  NA                           FACILITY:  [facility]   PHYSICIAN:  John L. Rendall, M.D.  DATE OF BIRTH:  September 15, 1950   DATE OF ADMISSION:  07/31/2006  DATE OF DISCHARGE:                              HISTORY & PHYSICAL   CHIEF COMPLAINT:  End stage osteoarthritis, right knee.   HISTORY OF PRESENT ILLNESS:  [name] is a 60 year old white female  with right knee pain for at least 10 years.  No new injury.  No  surgeries.  Pain in the knee is described as intermittent sharp pain in  the medial distal femur.  Pain increases with steps and prolonged  ambulation.  No assistive devices.  She has had no injections.  Mechanical symptoms positive for painful popping.  Aleve helps to some  degree with her pain.  The patient has known osteopetrosis.  The patient  had a previous total knee arthroplasty on the left by Dr. Telford Nab, June  2001.   ALLERGIES:  BENADRYL TOPICAL CAUSES ITCHING.  OTHERWISE NO KNOWN  ALLERGIES.   MEDICATIONS:  1. Lisinopril/HCTZ 20/25 one p.o. q.a.m.  2. Atenolol 50 mg one p.o. q.a.m.  3. Synthroid 0.75 one p.o. q.a.m.  4. Aleve 1 tablet daily.   PAST MEDICAL HISTORY:  Positive for hypertension, hypercholesterolemia,  hypothyroidism, chronic back pain, obesity and osteopetrosis.   PAST SURGICAL HISTORY:  1. She had surgery due to being cross-eyed in 1952.  2. Left knee scope times two.  3. Left total knee arthroplasty in 2001, Dr. Telford Nab.   The patient denies any complications of the above procedures.  She has  had no blood transfusions to her knowledge in the past.   SOCIAL HISTORY:  The patient denies any tobacco use.  Occasional  alcoholic beverage.  She lives alone in a one-story home, one step to  the usual entrance.  Her primary care physician is Dr. Jonny Ruiz of  [place], phone number is  [phone].   FAMILY HISTORY:  The patient's mother is age 17, has hypertension.  Father deceased, age 48, and history of MI, hypertension, cancer.  She  has one living brother who is age 62 and one living sister who also had  hypertension.   REVIEW OF SYSTEMS:  The patient denies any fevers, chills or flu-like  symptoms.  The patient denies any chest pain, shortness of breath, PND,  orthopnea.  She wears glasses for reading, has hypertension,  hypothyroidism, had anemia during menopause.  Has nocturia which is  unchanged.  Chronic back pain on waking.   PHYSICAL EXAMINATION:  GENERAL:  Patient is a well-developed, well-  nourished overweight female who walks without any assistive devices.  The patient's mood and affect are appropriate, talks easily with  examiner.  Height 5 feet 2, weight 300 pounds.  VITAL SIGNS:  Temp is 98.4, pulse 70, respiratory rate 16, blood  pressure 132/70.  HEENT:  Head normocephalic and atraumatic without frontal maxillary  sinus tenderness to palpation.  Conjunctivae are pink.  Sclerae are  nonicteric.  PERRLA.  EOMs are intact.  No visible external ear  deformity.  TMs pearly and gray bilaterally.  Nose shows nasal septum  midline, nasal mucosa pink and moist without polyps.  Buccal mucosa is  pink and moist.  The patient has good dental repair.  Pharynx without  erythema or exudate.  CARDIAC:  Regular rate and rhythm.  No murmurs, rubs or gallops noted.  RESPIRATORY :  Lungs clear to auscultation bilaterally with no wheezing,  rhonchi or rales noted.  ABDOMEN:  Soft, nontender, bowel sounds times four quadrants, obesity.  NECK:  No lymphadenopathy.  Carotids are 2+ without bruits.  No  tenderness to palpation along the cervical spine.  The patient has full  range of motion of the cervical spine.  BREASTS, GENITOURINARY AND RECTAL:  Exams deferred at this time.  BACK:  No tenderness with palpation over the thoracic and lumbar spine.  NEUROLOGIC:   Cranial nerves II-XII are grossly intact.  Alert and  oriented times three.  Lower extremity strength testing reveals 5/5  strength throughout.  Deep tendon reflexes are 1+ in the ankles and  knees bilaterally, and equal and symmetric.  MUSCULOSKELETAL:  Upper extremities -- The patient had full range of motion of the upper  extremities.  Does have mildly positive apprehension in impingement  testing in the right shoulder.  No pain with palpation over the distal  clavicle or greater tuberosity or biceps tendon region.  The patient has  full range of motion of elbows, wrists and hands.  Negative Tinel's  bilaterally.  Radial pulses are 2+ bilaterally.  Lower extremities:  The patient has full range of motion of both hips  without pain, flexion to 90 degrees.  Left knee 0-100 degrees of  flexion, well healed surgical incision.  She had some tenderness along  the lateral joint line.  No effusion, no edema.  Right knee 0-105  degrees of flexion, medial joint line tenderness.  No effusion, no  edema.  Valgus and varus stressing reveals no laxity.  She does have  slight varus deformity of the right knee.  Lower extremities have  minimal edema.  It is nonpitting.  Dorsal pedal pulses are 2+.   X-RAYS:  X-rays of right knee shows bone on bone in medial compartment  with femoral subluxing over the tibia.   IMPRESSION:  1. End stage osteoarthritis of right knee.  2. Osteopetrosis.  3. Hypertension.  4. Hypercholesterolemia.  5. Hypothyroidism.  6. Obesity.  7. Chronic back pain.   PLAN:  The patient is to undergo all preoperative labs and testing for  surgery.  She did receive clearance from Dr. Jonny Ruiz for her upcoming  right total knee arthroplasty to be performed on July 31, 2006.      Erskine Emery, P.A.      John L. Rendall, M.D.  Electronically Signed    GC/MEDQ  D:  07/25/2006  T:  07/25/2006  Job:  [id number]   cc:   Jenny Reichmann L. Rendall, M.D. Fax: [phone]

## 2011-01-21 NOTE — Op Note (Signed)
   NAME:  [name], [name]                           ACCOUNT NO.:  [id number]   MEDICAL RECORD NO.:  [id number]                   PATIENT TYPE:  AMB   LOCATION:  ENDO                                 [facility]ACILITY:  [facility]   PHYSICIAN:  Wonda Horner, M.D.                DATE OF BIRTH:  1951/03/14   DATE OF PROCEDURE:  02/21/2003  DATE OF DISCHARGE:                                 OPERATIVE REPORT   PROCEDURE:  Colonoscopy with biopsy.   INDICATIONS FOR PROCEDURE:  Rectal bleeding.   PREMEDICATIONS:  Fentanyl 50 mcg IV, Versed 6 mg IV.   CONSENT:  Informed consent was obtained.   DESCRIPTION OF PROCEDURE:  With the patient in the left lateral decubitus  position, the rectal exam was performed but no masses were felt.  The  Olympus colonoscope was inserted into the rectum and advanced around the  colon to the cecum.  Cecal landmarks were identified.  The cecum and  ascending colon were normal.  The transverse colon was normal.  The  descending colon and sigmoid revealed an occasional diverticulum.  In the  rectum there was a small 3 mm polyp removed with cold forceps.  No  abnormalities were seen in the anal area.  She tolerated the procedure well  without complications.   IMPRESSION:  1. Mild diverticulosis of the left colon.  2. Small rectal polyp.   COMMENTS:  The biopsies will be checked. I suspect that the patient's  intermittent rectal bleeding is secondary to some irritation in the anal  area.  However, no abnormalities is seen at this time.  I would recommend  that she eat a high fiber diet and taken a fiber supplement such as  Metamucil.                                               Wonda Horner, M.D.    SFG/MEDQ  D:  02/21/2003  T:  02/21/2003  Job:  [id number]   cc:   Teodoro Kil. Jonny Ruiz, M.D.  [place]  [place] [address]  Fax: [phone]

## 2011-01-21 NOTE — Discharge Summary (Signed)
[place]  Patient:    [name], [name]                        MRN: [id number] Adm. Date:  [id number] Disc. Date: [id number] Attending:  Donnamae Jude Ii Dictator:   Zenaida Deed, P.A.                           Discharge Summary  ADMISSION DIAGNOSES: 1. End stage osteoarthritis, left knee. 2. Hypertension. 3. Hypothyroidism. 4 Obesity.  DISCHARGE DIAGNOSES: 1. End stage osteoarthritis, left knee. 2. Hypertension. 3. Hypothyroidism. 4. Obesity. 5. Posthemorrhagic anemia.  SURGICAL PROCEDURE:  On February 23, 2000 Ms. Weinrich underwent a left total knee arthroplasty by Dr. Jenny Reichmann L. Rendall.  COMPLICATIONS:  None.  CONSULTS: 1. Pharmacy consult for Coumadin therapy February 23, 2000. 2. Physical therapy and rehab medicine consult February 24, 2000. 3. Case management consult February 25, 2000 in addition to occupational therapy    consult.  HISTORY OF PRESENT ILLNESS:  This 60 year old white female presented to Dr. Telford Nab with a 10-year history of left knee pain.  The pain has gotten progressively worse over the last several years and has been unrelieved with arthroscopy.  She has had a moderate amount of stiffness in the knee and the pain is an aching sensation which is constant in the medial posterior aspect of the knee.  There is no radiation of the pain.  There is popping and grinding of the knee and the pain increasing with getting up or down or in and out of chairs and with prolonged standing.  She has had minimal relief of the pain with conservative measures until she is presenting for a left total knee arthroplasty.  HOSPITAL COURSE:  Ms. Cranford tolerated her surgical procedure well without immediate postoperative complications.  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  She was subsequently transferred to 4700.  On postoperative day 1,  she was afebrile, vital signs stable.  Her hemoglobin was 9.4 with hematocrit of 30.0.  Left knee dressing was intact without drainage and her leg was neurovascularly intact.  She was started on CPM per protocol.  Normal pain control measures were utilized.  She continued to make good progress over the next several days.  She remained afebrile, vital signs stable.  Her incision was well approximated with staples with minimal drainage.  The leg was neurovascularly intact.  By June 23, her hemoglobin was 9.1 with hematocrit of 26.5 and on June 24, her hemoglobin remained stable with the same numbers.  On the 24th, she was doing well enough that it was believed she could be discharged home and she was discharged home later in the day.  DISCHARGE INSTRUCTIONS:  She was to resume all prehospitalization medications and diet.  Coumadin 5 mg 1 tablet p.o. q.d. to take as directed per pharmacy. Oxy-Contin 20 mg 1 tablet p.o. q.12h.  Oxy-IR 5 mg 1 to 2 tablets q.4-6h. p.r.n. for pain.  She was to eat three bananas per day.  She was to be out of bed weightbearing as tolerated on the left leg with the use of a walker.  Sbhe is to have home health physical therapy and home health RN to come draw her pro times.  She is to also have a potassium level drawn  on her first pro time. She is to keep her incision clean and dry.  Notify Dr. Telford Nab of temperature greater than 101.5, chills, pain unrelieved by pain meds or foul-smelling drainage from the wound.  She is to follow up with Dr. Marella Bile in our office in about 7 to 10 days and is to call [phone] to set up that appointment.  She stated good understanding these instructions and was discharged home.  LABORATORY DATA:  On February 17, 2000 her hemoglobin was 11.6 with a hematocrit of 35.3.  On June 21, hemoglobin was 9.4, hematocrit 30.3.  On June 22, hemoglobin 9.3, hematocrit 27.4.  June 23, hemoglobin 9.1, hematocrit 26.5. June 14 PT 13.4 seconds, INR  1.1, PTT 28. June 24 PT 20.1 seconds, INR 2.3. June 21 sodium 133, potassium 3.5, glucose 138, calcium 8.2. June 22 sodium 133, potassium 3.3, chloride 95, glucose 139, calcium 8.3.  June 24 soduim 131, potassium 3.2, cholride 92, CO2 33, glucose 119, BUN 13, creatinine 0.8, calcium 8.1.  Urinalysis on June 14 showed 1 mg per dl of urobilinogen and all other laboratory studies were within normal limits. DD:  05/11/00 TD:  05/12/00 Job: [id number]

## 2011-01-21 NOTE — Discharge Summary (Signed)
[employer]  Patient:    [name], [name]                        MRN: [id number] Adm. Date:  [id number] Disc. Date: [id number] Attending:  Donnamae Jude Ii Dictator:   Mike Craze. Petrarca, P.A.-C.                           Discharge Summary  There was no dictation for this report. DD:  04/02/00 TD:  04/04/00 Job: [id number]

## 2011-01-21 NOTE — Patient Instructions (Signed)
Continue current medication.   Blood pressure is much better. Treating the  Sleep apnea is crucial to  helping the a fib and stroke protection.  ROV  In September  2012

## 2011-01-21 NOTE — Discharge Summary (Signed)
NAMEMILEIGH, TILLEY NO.:  [id number]   MEDICAL RECORD NO.:  [id number]          PATIENT TYPE:  INP   LOCATION:  [place]                         FACILITY:  [facility]   PHYSICIAN:  John L. [name], M.D.  DATE OF BIRTH:  02-03-1951   DATE OF ADMISSION:  07/31/2006  DATE OF DISCHARGE:  08/03/2006                               DISCHARGE SUMMARY   ADMISSION DIAGNOSIS:  End-stage osteoarthritis of the right knee.   DISCHARGE DIAGNOSIS:  1. End-stage osteoarthritis of the right knee.  2. Osteopetrosis.  3. Hypo-osmolality.  4. Hypertension.  5. Hypothyroidism.  6. Obesity.  7. Hypercholesterolemia.   PROCEDURE:  Right total knee arthroplasty.   HISTORY:  [name] is a 60 year old, white female with right knee pain  for at least 10 years.  She has no new injuries.  No recent surgeries on  the knee.  The pain is mainly described as an intermittent, sharp pain  in the medial distal femur.  The pain increases with stairs and with  prolonged ambulation.  Not using any assistive devices.  She has had no  injections.  Mechanical symptoms positive for painful popping.  Aleve  helps to some degree with her pain.  The patient has known  osteopetrosis.  Previous total knee arthroplasty in June 2001 on the  left knee.  Admitted at this time for surgery.   HOSPITAL COURSE:  A 60 year old female admitted July 31, 2006, after  appropriate laboratory studies were obtained, as well as 1 g of Ancef IV  on call to the operating room.  She was taken to the operating room,  where she underwent a right total knee arthroplasty.  She tolerated the  procedure well.  She was continued postoperatively on Ancef 1 g IV q.8h.  times 3 doses.  Then, she was started on Celebrex 400 mg p.o. on the day  of her surgery and 200 mg b.i.d.  Arixtra 2.5 mg subcutaneously at 8  a.m. starting August 01, 2006, was ordered for 7 days.  Consultations  with PT and OT and Care Management were made.   Weight-bearing as  tolerated.  A Dilaudid PCA pump was used for postoperative pain  management.  She was allowed out of bed to a chair the following day.  On the 27th, she was weaned to oral pain medicines.  IV was changed to  normal saline and then locked when tolerating her p.o.'s well.  Arixtra  instructions were given.  She did have decrease of her blood pressure on  the 28th and, at that time, 150 of normal saline bolus was given and the  BP was then checked.  Her blood pressure medicines were held for a  systolic pressure less than 100.  Normal saline was then restarted 100  mL per hour for 4 hours and then an urinary output was checked, and then  she was decreased to 75 mL per hour.  Her osmolality was decreased and  she was given 10 of Lasix IV and had good improvement in urine output at  that time.  She was also given  40 of Lasix on the 29th and she was  discharged after her morning PT by Dr. Telford Nab.  She was discharged in  improved condition.   LABORATORY STUDIES:  Admitted with a hemoglobin of 11.9, hematocrit  35.2%, white count 9800, platelets 323,000.  Discharge hemoglobin 8.5,  hematocrit 25.1%, white count 11,000, platelets 258,000.  Sodium 138,  potassium 3.8, chloride 98, CO2 28, glucose 118, BUN 16, creatinine 1,  calcium 9.3, total protein 6.9, albumin 3.6.  AST 21, ALT 15, ALP 86,  total bilirubin 0.8.  Discharge sodium 128, potassium 3.9, chloride 94,  CO2 25, glucose 137, BUN 34, creatinine 1.6, calcium 8.4.  Osmolality of  __________ was 272.  Urinalysis on __________ , was benign for a voided  urine.  Blood type was O-positive.  Antibody screen negative.  Urine  culture showed no growth.  EKG was read as normal sinus rhythm with  nonspecific ST and T wave changes which were minor.  Chest x-ray showed  cardiomegaly without CHF on November 21.  November 26 revealed  postoperative right total knee replacement without demonstrated  complications.   DISCHARGE  INSTRUCTIONS:  Given a prescription for Percocet 5/325 one to  2 tablets every 4 hours as needed for pain.  Arixtra 2.5 mg inject daily  as taught at 8 a.m.  Last dose was August 07, 2006.  Begin aspirin 81  mg daily.  No restrictions to her diet.  No driving or lifting for 6  weeks.  May shower on Friday.  Crutches, weight-bearing as tolerated.  CPM 0 to 90 degrees for 6-8 per day and increasing 5 degrees per day to  110 degrees max.  Follow up with Dr. Telford Nab on August 15, 2006.  Discharge in improved condition.      [name], [name].      [name], M.D.  Electronically Signed    BDP/MEDQ  D:  09/15/2006  T:  09/15/2006  Job:  [id number]

## 2011-01-21 NOTE — Progress Notes (Signed)
  Subjective:    Patient ID: [name], female    DOB: 1951/07/15, 60 y.o.   MRN: [id number]  HPI [name] comes in today for followup from her last visit for multiple medical issues. Since her visit in March she's had a number of evaluations. She is seeing Dr. Ernesto Rutherford her ENT who referred for a sleep apnea study which showed severe sleep apnea hypopnea syndrome AHI I11 4.2 with a nadir of 74% pulse ox room air. Her sleep was not sustained said there was no split study done.   The plan was to do CPAP and consider airway surgery when appropriate. She also had an evaluation through her cardiologist Dr. Gwenlyn Found.  She was diagnosed with atrial fibrillation.  She was placed on rivoraxaban  xa inhibitor .  And she stopped the Plavix Daily . No bleeding or falling  HT her atenolol was increased to 50 am, and 25 pm and her bp readings are pretty good.   CVA she has traumatically recovered from her deficits still has a very slight left arm weakness walks independently and hop on in the bone surfaces but otherwise doing well no falling no new symptoms. She has what is probably a final visit with her physical medicine Dr. this month.  She has stopped the baclofen because her spasms are better.   Labs done 2 weeks ago. Per neurology.  Believes it is 3-4 part of the labs. To get pap smear gyne check in June. Review of Systems     Objective:   Physical Exam WDWN in nad  Walks independently without assistance  Oriented x 3 and no noted deficits in memory, attention, and speech. Chest:  Clear to A&P without wheezes rales or rhonchi CV:  S1-S2 no gallops or murmurs peripheral perfusion is normal ocass irreg beat. Legs  1_ edema no redness or joint effusion.  Reviewed data from  [place] and Gwenlyn Found  So far available.        Assessment & Plan:  SpCVA   prob secondary  AFib   And severe  OSA  (With small airway. )  And morbid obesity Sig recovery fortunately  HT better controlled currently. Morbid  obesity Counseled. About importance of OSA rx and  Poss underlying trigger for above   Scenario.  She seems more motivated to lose weight  At this point. Reviewed  Also bleeding risk n Xa inhibitors  But of poss increased efficacy of med for stroke prevention.  To get cpap first and then airway surgery if necessary.   Poss August if needed.( 6 months out from CVA)  Mood stable   Will rov in September and help coordinate care in the meantime if needed. (?labs have been done but dont have results  In ehr)  Total visit 8mns > 50% spent counseling and coordinating care

## 2011-01-21 NOTE — Op Note (Signed)
[name], [name] NO.:  [id number]   MEDICAL RECORD NO.:  [id number]          PATIENT TYPE:  INP   LOCATION:  0005                         FACILITY:  [facility]   PHYSICIAN:  John L. Rendall, M.D.  DATE OF BIRTH:  20-Mar-1951   DATE OF PROCEDURE:  07/31/2006  DATE OF DISCHARGE:                               OPERATIVE REPORT   PREOPERATIVE DIAGNOSIS:  Osteoarthritis, right knee with deformity.   SURGICAL PROCEDURES:  Right LCS total knee with computer navigation  assistance.   POSTOPERATIVE DIAGNOSIS:  Osteoarthritis, right knee with deformity.   SURGEON:  Rendall   ASSISTANT:  Petrarca, PAC   ANESTHESIA:  General.   PATHOLOGY:  The patient is obese with a 3-1/2 inch fat layer on the leg.  She, in addition, has complete bone against bone medially with  subluxation of the femur medially over the tibia and deformity of the  proximal tibia.  There is measured 5 degrees varus and 15 degree flexion  contracture found at the start of computer navigation assistance.   PROCEDURE:  Under general anesthesia, the right leg is prepared with  DuraPrep and draped as a sterile field.  The leg is elevated and the  tourniquet is used at 400 mm.  A midline incision approximately 12  inches in length is made.  The patella is everted in a subcutaneous  pocket.  The tibia and femur are exposed.  The femur is sized to the  standard.  Minor debridement is done in preparation for navigation.  Schanz pins are then placed in the proximal medial tibia through extra  drill holes.  And additional Schanz pins are placed in the distal medial  femur.  It should be noted that this patient has osteopetrosis which is  the opposite of osteoporosis.  Most of her bone has a consistency of  ivory with very few areas of cancellus whatsoever.  Even placing Schanz  pins was considerable effort, and special attention to obtaining the  [employer] air instruments instead of the standard battery powered  instruments for additional power.  Once the computer arrays were set up,  the center of the femoral head was identified.  Medial and lateral  malleoli were identified.  The tibia and femur were then mapped within  0.5 mm of reproducibility.  Once this was done, the proximal tibial  resection was carried out.  This bone was very deformed on its proximal  end, and 10 mm was removed laterally, but only 1-2 mm medially.  This  left total ivory bone in the medial portion.  Spurs were then taken off  the medial tibia.  Balancing was then done, balancing the ligaments for  flexion and extension.  Once this was completed, anterior and posterior  flare of the distal femur were resected.  It left a slightly posterior  placement of the femoral component, but a solid 10 mm flexion gap distal  femoral cut was then made and with the computer assistance and a  balanced 10 mm extension gap was made within 1 mm of anatomic alignment.  Once this was completed,  the posterior corners of the menisci and  cruciates and spurs on the back of the femur were resected.  Recessing  guide was then used the high-speed air drill and saw were used for all  of this, as the standard equipment was too slow.  Once this portion was  completed, the proximal tibia was exposed and sized to the 2.5.  Center  peg hole was created.  The fin, it was not possible to drive the rasp  component in to make the fin, but it had to be cut out using a saw and  then a large drill.  Once this was completed, the tibial tray was  seated, 10 bearing femoral component.  Excellent fit, alignment, and  stability were noted within 2 degrees of anatomic axis and full  extension.  At this point, the patella was osteotomized and 3 peg holes  placed.  Permanent components were obtained.  The bony surfaces were  then prepared with combination of pulse irrigation and using a standard  drill bit to puncture holes where ever the bone was too hard to accept   cemented and cancellus recesses which basically meant every centimeter  over the entire surface of the tibia and femur had to be drilled and  same with the patella.  Once this was completed and washed away with  pulse irrigation, components were cemented in place.  While the cement  was hardening, the alignment of the components was adjusted slightly to  anatomical axis using pressure on the setting cement and the prosthesis.  Schanz pins and arrays were then removed.  Once cement hardened, excess  cement was removed, and the tourniquet was let down at 1 hour and 59  minutes.  The multiple small vessels were cauterized and excess cement  removed.  Great care was taken to determine no excess cement in the back  of the knee.  The knee was then closed in layers with #1 Tycron.  It was  tested for stability and found to have excellent stability and  extension, 30 degrees flexion and 90 degrees flexion.  The subcu layer  was then closed in 3 rows of suture due to its depth.  A medium Hemovac  drain was in place, and staples were inserted.  Total operative time  right at 2-1/2 hours.  The patient tolerated the procedure well and  returned to recovery in good condition.      John L. Rendall, M.D.  Electronically Signed     JLR/MEDQ  D:  07/31/2006  T:  07/31/2006  Job:  [id number]

## 2011-01-21 NOTE — Discharge Summary (Signed)
Stacy, [name] NO.:  [id number]   MEDICAL RECORD NO.:  [id number]          PATIENT TYPE:  INP   LOCATION:  [place]                         FACILITY:  [facility]   PHYSICIAN:  Gaynelle Arabian, M.D.    DATE OF BIRTH:  1951-07-14   DATE OF ADMISSION:  07/21/2008  DATE OF DISCHARGE:  07/24/2008                               DISCHARGE SUMMARY   ADMITTING DIAGNOSES:  1. Loose left total knee.  2. Morbid obesity.  3. Hypertension.  4. Hypothyroidism.   DISCHARGE DIAGNOSES:  1. Loose left tibial component, left total knee, status post revision      of tibial and patellar components of left knee.  2. Acute blood loss anemia, did not require transfusion.  3. Postoperative hyperkalemia, improved.  4. Hyponatremia, improving.  5. Morbid obesity.  6. Hypertension.  7. Hypothyroidism.   PROCEDURE:  July 21, 2008 revision of tibia and patellar components,  left knee.   SURGEON:  Dr. Wynelle Link.   ASSISTANT:  Stacy Muslim, PA-C.   ANESTHESIA:  General.  Postop Marcaine pain pump.   CONSULTS:  None.   BRIEF HISTORY:  The patient is a 60 year old female who had a left total  knee done by Dr. Tommie Raymond back in 2001, did well until a few years ago,  had progressive worsening pain.  Her exam was consistent with a loose  tibial component, history of osteoporosis, thus abnormal bone density  was felt due to the contentions, the cement loosened, and now presents  for revision.   LABORATORY DATA:  The green laboratory data sheets are not on this  chart, so I do not her pre admitting CBC.  Her postop CBC showed a  hemoglobin of 10 that came down to 9.1.  Last noted H and H were 8.8 and  25.60.  I  do not have the admitting chem panel but her sodium was a  little low postop of 133 but it came back up to 134 before discharge.  She had some hyperkalemia postop.  I do not have the preop one.  She was  5.5 and came down to 3.7.  Pro time INRs were followed.  Last noted  PT/INR  18.9 and 1.5.   EKG July 18, 2008 sinus rhythm with first-degree AV block, otherwise  normal.   Chest x-ray July 18, 2008 no acute cardiopulmonary disease.  Left  knee films on July 22, 2008 left tibial prosthetic lucencies.   HOSPITAL COURSE:  The patient was admitted to [facility],  tolerated the procedure well, later transferred to the recovery room and  orthopedic floor.  Started on PCA and p.o. analgesic pain control  following surgery.  Given 24 hours postop IV antibiotics.  Started on  Coumadin for DVT prophylaxis.  Had a little bit of hyperkalemia.  Did  have potassium in the fluids, so we removed that.  It was felt to be due  to the IV potassium.  Rechecked and it came back down.  Had a drain  placed at the time of surgery; it was left in.  On postop day #1  sodium  was a little low, felt to be due to some dilutional component.  Blood  pressure meds were restarted and on parameters.  Started getting up with  therapy on day 1.  By day 2 the patient was doing fantastic.  Pain was  under excellent control.  Started walking about 55 feet.  Hemoglobin was  down a little bit, down to 9.1, so placed her on some iron.  Potassium  was back down to her normal level, although sodium was low but stable at  133.  Continued to progress well with physical therapy and by day 3 her  sodium was back up.  Potassium remained normal.  She was progressing  with physical therapy, tolerating meds and discharged home.   DISCHARGE PLAN:  The patient was discharged home on July 24, 2008.   DISCHARGE DIAGNOSES:  Please see above.   DISCHARGE MEDICATIONS:  Coumadin, Nu-Iron, Percocet, Robaxin.   FOLLOWUP:  2 weeks.   ACTIVITY:  Total knee protocol.  Home health PT, home health nursing.   DISPOSITION:  Home.   CONDITION ON DISCHARGE:  Improved.      [name], P.A.C.      Gaynelle Arabian, M.D.  Electronically Signed    ALP/MEDQ  D:  09/17/2008  T:   09/18/2008  Job:  [id number]   cc:   Gaynelle Arabian, M.D.  Fax: [phone]

## 2011-01-21 NOTE — Op Note (Signed)
[place]. [facility]  Patient:    [name], [name]                        MRN: [id number] Proc. Date: 02/23/00 Adm. Date:  [id number] Attending:  Donnamae Jude Ii                           Operative Report  PREOPERATIVE DIAGNOSIS:  End-stage osteoarthritis, left knee with marble bone disease.  POSTOPERATIVE DIAGNOSIS:  End-stage osteoarthritis, left knee with marble bone disease.  OPERATION PERFORMED:  Left LCS total knee replacement.  SURGEON:  John L. Rendall III, M.D.  ASSISTANT: 1. Lara Mulch, M.D. 2. Zenaida Deed, P.A.  ANESTHESIA:  General.  PATHOLOGY:  The patient has bone-against-bone medial compartment of the knee with varus deformity and early subluxation of the femur over the tibia medially.  In addition, she has marble bone disease with virtually hard as ivory bone on femur, tibia and patella.  DESCRIPTION OF PROCEDURE:  Under general anesthesia, the left leg was prepared with DuraPrep and draped as a sterile field.  A midline incision was made. The patella was everted after medial parapatellar deep dissection.  The medial collateral ligament was freed from the tibia to correct the varus deformity. The knee was then flexed, multiple small vessels were cauterized.  The femur was sized as a standard.  The proximal tibial resection was then carried out. Cruciate ligaments were not preserved.  The bone was found to be quite hard in the proximal tibia and it was necessary to continuously irrigate the saw blades to avoid essentially burning the bone.  Following the proximal tibial resection, the first femoral guide was used and an intercondylar drill hole was made.  This was only possible to drill about an inch deep into the femur but it was well centralized.  Using the standard femoral guide, the anterior and posterior resection of the distal femur was done with a 17.5 flexion gap. Following resection of the flare of the distal femur,  it was not possible to insert an intramedullary guide and a modification was done using the external guide to determine how and where to pin the femoral jig to cut the end of the femur.  Once this was pinned, distal femoral cut was made and was found to be insufficient.  A second femoral cut was made and about this time, we went to the second battery and second saw blade.  Recessing guide was then used and cuts again had to be redone to seat the component on the femur as it was too tight to seat the recessing guide front to back.  It was necessary to trim about 1 mm of bone off the back of the femur.  Once this was completed, the recessing guide was used and the Chamfer cuts were made.  Proximal tibia was then exposed after first inserting a lamina spreader, removing remnants of the cruciates and menisci and removing spurs on the back of the femur.  McCale was then inserted and the Hohmann retractor.  Tibia was sized at a standard plus and center hole was placed.  It was necessary to overdrill this to actually seat the trial as it did not want to seat all the way and there was danger of fracturing the tibia.  Following this, trial reduction of standard plus tibia 17.5 bearing and standard femur revealed good fit alignment and stability but slight  flexion laxity.  It was decided to go ahead with a 20 mm bearing and the laxity was corrected.  At this point the patella was osteotomized, the bone surfaces were irrigated and drilled with both the bur and a drill point to allow infiltration of the cement into the bony surfaces on the tibia and femur and on the patella.  Multiple pot marks were made on all surfaces, otherwise the cement literally would not penetrate.  Following this, all components were cemented in place.  Once the cement hardened, the tourniquet was let down at one hour and 59 minutes.  Multiple small vessels were cauterized.  The wound was then closed in layers over a drain.   Blood loss was about 100 cc.  #1 Tycron, 0 and 2-0 Vicryl and skin clips were used.  It should be noted that the bone was so hard that during this cased, three saw blades and three batteries were required but the end result was good fit, good alignment and good stability.  The patient returned to recovery in good condition. DD:  02/23/00 TD:  02/25/00 Job: [id number]

## 2011-01-21 NOTE — H&P (Signed)
[place]  Patient:    [name], [name]                       MRN: [id number] Adm. Date:  02/23/00 Attending:  Jenny Reichmann L. Wynona Luna, M.D. Dictator:   Evert Kohl, P.A.                         History and Physical  DATE OF BIRTH:  10/12/1950  CHIEF COMPLAINT:  Left knee pain.  HISTORY OF PRESENT ILLNESS:  The patient is a 60 year old female with approximately 10-year history of left knee pain. The patient states that initially it just started out with stiffness in her knee getting in and out of the chair and first thing in the morning but progressively worsened through the years to also include an aching sensation throughout the knee.  Several years ago, the patient had two arthroscopic evaluations of her left knee. She did have some short-term improvement with each arthroscopic evaluation but did gradually have return of her stiffness and discomfort.  The patient states currently she does have stiffness after short periods of rest.  She describes the pain as an aching sensation which is constant, located in the medial posterior aspect of the knee.  She does have popping and grinding.  It worsens with getting up and down, in and out of chairs, and with long standing periods.  The patient does not use any assistive devices at the current time.  DRUG ALLERGIES:  TOPICAL BENADRYL causes welts, but the patient is able to take oral Benadryl without any problems.  CURRENT MEDICATIONS: 1. Vioxx 25 mg p.o. q.d. 2. Zestoretic 20/25 mg p.o. q.d. 3. Synthroid 0.05 mg p.o. q.d.  PAST MEDICAL HISTORY:  The patient has been diagnosed with hypertension for the past three years.  This was discovered as an incidental findings during a physical examination, and she has been on the current medications since that time.  her medications have been stable.  The patient also had an incidental finding of low thyroid values.  She is asymptomatic, and her  primary care physician felt that she should be treated for this low thyroid level.  The patients only other medical problem that she admits to is obesity.  The patient denies any history of diabetes, hiatal hernia, peptic ulcer, heart disease, asthma, or any other GI/GU problems.  PAST SURGICAL HISTORY:  The patient had eye surgery at 60 years old, arthroscopic surgery x 2, normal pregnancy x 1.  The patient denies any problems with surgical procedures in the past.  SOCIAL HISTORY:  The patient is a 60 year old, white, moderately obese female. She denies any history of smoking.  She does occasionally drink alcohol socially.  The patient is divorced.  She does have one child.  The patient currently lives in a [place] house with two steps into the main entrance. She is currently full time employed in [employer].  The patient does not have a living will or power of attorney.  Family physician is listed as [facility] on [address], [phone].  FAMILY MEDICAL HISTORY:  Mother is alive at age 6 with a history of hypertension, emphysema, and osteoporosis.  Father is deceased from cancer at the age of 7.  The patient does have one brother deceased from a motor vehicle accident.  Otherwise, one brother and one sister, both alive and in good health with only hypertension  REVIEW  OF SYSTEMS:  Positive for only decreased weight of 20 to 30 pounds over the last several months due to eating healthier diet.  Otherwise Review of Systems is negative and noncontributory for any other sensory, respiratory, cardiac, GU, GI, orthopedic, hematologic, neurologic or mental status problems that were contributory at this time.  PHYSICAL EXAMINATION:  VITAL SIGNS:  Weight 300 pounds, height 5 feet 2 inches.  Pulse 80, respirations 16, temperature 98.4, blood pressure 138/82.  GENERAL:  This is a moderately obese, very pleasant female.  She ambulates without any significant difficulty.  She gets  on and off the exam table without any difficulty.  HEENT:  Head is normocephalic, atraumatic, nontender over maxillary or frontal sinuses.  Pupils are equal, round and reactive and accommodating to light. Extraocular movements are intact.  Sclerae nonicteric conjunctivae pink and moist.  Funduscopic exam reveals a positive red reflex bilaterally with normal-appearing vasculature.  External ears without deformities, canals patent.  TMs were pearly grey and intact.  Nasal septum was midline.  Mucous membranes were pink and moist.  No polyps noted.  Oral buccal mucosa was pink and moist without lesions.  Dentition was in good repair.  Uvula was midline and moved symmetrically with phonation.  NECK:  Supple with no lymphadenopathy.  Thyroid gland was nontender.  The patient had good range of motion of her cervical spine without any tenderness or difficulty. LUNGS:  Lung sounds were distant, but they were clear and equal bilaterally. No wheezes, rales, rhonchi, or rubs noted.  HEART:  A regular rate and rhythm of S1 and S2 were auscultated.  A 1 to 2 systolic murmur was heard in the left precordium.  ABDOMEN:  Obese, nontender throughout, difficulty palpating any hepatosplenomegaly.  CVA was nontender to percussion.  EXTREMITIES:  Upper extremities were symmetrical size and shape.  She had good range of motion of her shoulders, elbows, and wrists bilaterally with no deficits noted.  Motor strength in all muscle groups was symmetrical and normal appearing.  Lower Extremities: Right and left hips were with good flexion and extension. The patient was able to fully extend to 0, flex back to about 95 degrees. There was a 20 to 30 degree internal/external rotation without any mechanical symptoms or difficulty.  Right knee was large.  No sign of erythema, ecchymosis, or soft tissue swelling, no palpable swelling.  There was a minimal amount of crepitus on the patella with range of motion.  She  had full extension and flexion back to about 125 degrees.  There was no valgus varus  laxity, and anterior posterior drawer was negative.  The left knee was tender along the medial joint line significantly with minimal amount of lateral joint line tenderness.  There was a moderate amount of crepitus of the patella with range of motion which was 0 degrees back to about 125 degrees.  There was no valgus varus laxity of the knee and no anterior posterior drawer.  Calves were nontender bilaterally.  Ankles were symmetrical size and shape with good dorsiflexion and plantar flexion without any difficulties.  The lower extremities were significantly obese in the thighs and calves.  PERIPHERAL VASCULATURE:  The patients carotid pulses were 2+ with no bruits. Radial pulses 2+.  Femoral pulses 1+ bilaterally.  Dorsalis pedis pulses and posterior tibial pulses were unable to be palpated.  Capillary refill in all the toes was brisk at 2 seconds.  The patient had no lower extremity pitting edema or venostasis changes noted.  NEUROLOGIC:  The patient was conscious, alert, and appropriate and held an easy conversation with the examiner.  Cranial nerves II-XII grossly intact. Deep tendon reflexes of the upper and lower extremities were symmetric at all locations.  The patient was grossly intact to light touch sensation from head to toe.  BREASTS/RECTAL/GU:  Exams deferred at this time.  LABORATORY DATA:  Impression of x-rays taken in our office shows medial joint space bone on bone with femoral subluxation.  IMPRESSION: 1. End-stage osteoarthritis, left knee. 2. Hypertension. 3. Hypothyroidism. 4. Obesity.  PLAN:  The patient will be admitted to [facility] on June 20 under the care of John L. Rendall, M.D.  The patient will undergo all routine labs and tests prior to this surgical procedure. DD:  02/16/00 TD:  02/16/00 Job: [id number]

## 2011-01-24 ENCOUNTER — Ambulatory Visit: Payer: Self-pay | Admitting: Internal Medicine

## 2011-02-01 ENCOUNTER — Ambulatory Visit: Payer: BC Managed Care – PPO | Admitting: Physical Medicine & Rehabilitation

## 2011-02-01 ENCOUNTER — Encounter: Payer: BC Managed Care – PPO | Attending: Physical Medicine & Rehabilitation

## 2011-02-01 DIAGNOSIS — I6330 Cerebral infarction due to thrombosis of unspecified cerebral artery: Secondary | ICD-10-CM

## 2011-02-01 DIAGNOSIS — Z96659 Presence of unspecified artificial knee joint: Secondary | ICD-10-CM | POA: Insufficient documentation

## 2011-02-01 DIAGNOSIS — I10 Essential (primary) hypertension: Secondary | ICD-10-CM | POA: Insufficient documentation

## 2011-02-01 DIAGNOSIS — M7989 Other specified soft tissue disorders: Secondary | ICD-10-CM | POA: Insufficient documentation

## 2011-02-01 DIAGNOSIS — R29898 Other symptoms and signs involving the musculoskeletal system: Secondary | ICD-10-CM | POA: Insufficient documentation

## 2011-02-01 DIAGNOSIS — I69998 Other sequelae following unspecified cerebrovascular disease: Secondary | ICD-10-CM | POA: Insufficient documentation

## 2011-02-01 DIAGNOSIS — G8110 Spastic hemiplegia affecting unspecified side: Secondary | ICD-10-CM

## 2011-02-01 DIAGNOSIS — E039 Hypothyroidism, unspecified: Secondary | ICD-10-CM | POA: Insufficient documentation

## 2011-02-01 DIAGNOSIS — I69959 Hemiplegia and hemiparesis following unspecified cerebrovascular disease affecting unspecified side: Secondary | ICD-10-CM | POA: Insufficient documentation

## 2011-02-02 NOTE — Assessment & Plan Note (Signed)
REASON FOR VISIT:  Left hemiparesis due to stroke.  HISTORY OF PRESENT ILLNESS:  A 60 year old female who has a history of right CVA causing left hemiparesis.  She was an inpatient at [facility] followed by a stay at [facility]. She was seen by me in followup on December 20, 2010.  Since I last saw her she is followed up with her cardiologist, Dr. Gwenlyn Found.  She had a cardiac monitor diagnosed with AFib and was taken off her Plavix and put on Xarelto.  She has also been diagnosed with sleep apnea.  Her ENT recommended a tonsillectomy, adenoidectomy, uvulectomy, but her neurologist, Dr. Leonie Man, recommended a CPAP.  Also recommended no surgery until August.  SOCIAL HISTORY:  Has lost her job.  She has been replaced where she worked for 17 years.  REVIEW OF SYSTEMS:  Positive for sleep apnea.  FUNCTIONAL STATUS:  Walks 10 minutes at a time.  She can climb steps with a cane but this is difficult for her.  She can drive.  HER OTHER PAST HISTORY:  She does have osteochondral lesions of the left talus.  She has total knees on the left side with revisions and on the right side, she has a right total shoulder.  Her weight has been decreasing about 20 pounds since February.  She is trying to lose weight.  PHYSICAL EXAMINATION:  GENERAL:  Morbidly obese female in no acute distress. VITAL SIGNS:  Blood pressure 132/60, pulse 78, respirations 18, and sat 95% on room air. EXTREMITIES:  Valgus deformity bilateral knees.  No evidence toe drag or knee instability.  She has pitting edema in bilateral ankles, left greater than right, but this is mild-to-moderate.  Sensation normal in the lower extremities.  She has 5/5 right hip flexor, knee extensor, ankle dorsiflexor and 4+/5 hip flexor, knee extensor ankle dorsiflexor on the left side.  On the left upper extremity, she has 4/5 strength in deltoid.  Biceps and triceps grip on the right upper extremity 5/5.  IMPRESSION: 1.  Right frontal cerebrovascular accident.  Primarily affecting left     lower extremity, although her strength has improved quite a bit     overtime.  She at one point had no active ankle dorsiflexion and     plantar flexion. 2. Osteochondral lesion, left ankle. History of bilateral total knees.     Certainly in combination with her weight, this is limiting her     mobility.  I did advise weight loss and she is continued to lose     about a pound a week. 3. Sleep apnea recommended following up with CPAP, this can also     reduced recurrent stroke risk as well as aid with weight loss. 4. Exercise.  She is starting to go into her sister's pool to do some     aquatic-type exercise.  She also has a recumbent bike at home which     I advised her to continue using.  I discussed with the patient,     agrees with plan.     I will see her back in about 6 weeks. Followup with Dr. Leonie Man, from     Neurology.  Followup with Dr. Gwenlyn Found, Cardiology.  Followup with Dr.     Regis Bill, primary care.     Charlett Blake, M.D. Electronically Signed    AEK/MedQ D:  02/01/2011 14:28:36  T:  02/02/2011 02:02:43  Job #:  [id number]  cc:   Pramod P. Leonie Man, MD Fax:  [phone]  Quay Burow, M.D. Fax: [phone]  Standley Brooking. Regis Bill, MD [address] [place], [place] [address]

## 2011-02-23 ENCOUNTER — Telehealth: Payer: Self-pay | Admitting: Internal Medicine

## 2011-02-23 NOTE — Telephone Encounter (Signed)
Pt called and is needing Dr Regis Bill to complete a Long Term Disability Eval form. Pt is req a call back from nurse asap.

## 2011-02-23 NOTE — Telephone Encounter (Signed)
Spoke to pt- she needs this for the CVA. I told pt that she needs to see if one of the specialist can do this first. Then if not, I would see what Dr. Regis Bill thinks about filling it out.

## 2011-02-28 ENCOUNTER — Ambulatory Visit (INDEPENDENT_AMBULATORY_CARE_PROVIDER_SITE_OTHER): Payer: BC Managed Care – PPO | Admitting: Internal Medicine

## 2011-02-28 VITALS — BP 120/70

## 2011-02-28 DIAGNOSIS — D518 Other vitamin B12 deficiency anemias: Secondary | ICD-10-CM

## 2011-02-28 MED ORDER — CYANOCOBALAMIN 1000 MCG/ML IJ SOLN
1000.0000 ug | Freq: Once | INTRAMUSCULAR | Status: AC
  Administered 2011-02-28: 1000 ug via INTRAMUSCULAR

## 2011-03-11 ENCOUNTER — Encounter (HOSPITAL_COMMUNITY)
Admission: RE | Admit: 2011-03-11 | Discharge: 2011-03-11 | Disposition: A | Discharge status: Home / Self Care | Payer: BC Managed Care – PPO | Source: Ambulatory Visit | Attending: Otolaryngology | Admitting: Otolaryngology

## 2011-03-11 LAB — BASIC METABOLIC PANEL WITH GFR
BUN: 20 mg/dL (ref 6–23)
CO2: 29 meq/L (ref 19–32)
Calcium: 9.6 mg/dL (ref 8.4–10.5)
Chloride: 100 meq/L (ref 96–112)
Creatinine, Ser: 0.87 mg/dL (ref 0.50–1.10)
GFR calc Af Amer: >60 mL/min
GFR calc non Af Amer: >60 mL/min
Glucose, Bld: 115 mg/dL — ABNORMAL HIGH (ref 70–99)
Potassium: 4.6 meq/L (ref 3.5–5.1)
Sodium: 140 meq/L (ref 135–145)

## 2011-03-11 LAB — CBC
HCT: 39.3 % (ref 36.0–46.0)
Hemoglobin: 13.1 g/dL (ref 12.0–15.0)
MCH: 29.6 pg (ref 26.0–34.0)
MCHC: 33.3 g/dL (ref 30.0–36.0)
MCV: 88.9 fL (ref 78.0–100.0)
Platelets: 239 K/uL (ref 150–400)
RBC: 4.42 MIL/uL (ref 3.87–5.11)
RDW: 15.6 % — ABNORMAL HIGH (ref 11.5–15.5)
WBC: 11.4 K/uL — ABNORMAL HIGH (ref 4.0–10.5)

## 2011-03-11 LAB — APTT: aPTT: 43 s — ABNORMAL HIGH (ref 24–37)

## 2011-03-11 LAB — URINALYSIS, ROUTINE W REFLEX MICROSCOPIC
Bilirubin Urine: NEGATIVE
Glucose, UA: NEGATIVE mg/dL
Hgb urine dipstick: NEGATIVE
Ketones, ur: NEGATIVE mg/dL
Leukocytes, UA: NEGATIVE
Nitrite: NEGATIVE
Protein, ur: NEGATIVE mg/dL
Specific Gravity, Urine: 1.019 (ref 1.005–1.030)
Urobilinogen, UA: 1 mg/dL (ref 0.0–1.0)
pH: 6.5 (ref 5.0–8.0)

## 2011-03-11 LAB — PROTIME-INR
INR: 1.73 — ABNORMAL HIGH (ref 0.00–1.49)
Prothrombin Time: 20.6 s — ABNORMAL HIGH (ref 11.6–15.2)

## 2011-03-11 LAB — SURGICAL PCR SCREEN
MRSA, PCR: NEGATIVE
Staphylococcus aureus: NEGATIVE

## 2011-03-18 ENCOUNTER — Ambulatory Visit (HOSPITAL_COMMUNITY)
Admission: RE | Admit: 2011-03-18 | Discharge: 2011-03-19 | Disposition: A | Discharge status: Home / Self Care | Payer: BC Managed Care – PPO | Source: Ambulatory Visit | Attending: Otolaryngology | Admitting: Otolaryngology

## 2011-03-18 ENCOUNTER — Other Ambulatory Visit: Payer: Self-pay | Admitting: Otolaryngology

## 2011-03-18 DIAGNOSIS — Z8673 Personal history of transient ischemic attack (TIA), and cerebral infarction without residual deficits: Secondary | ICD-10-CM | POA: Insufficient documentation

## 2011-03-18 DIAGNOSIS — J343 Hypertrophy of nasal turbinates: Secondary | ICD-10-CM | POA: Insufficient documentation

## 2011-03-18 DIAGNOSIS — I10 Essential (primary) hypertension: Secondary | ICD-10-CM | POA: Insufficient documentation

## 2011-03-18 DIAGNOSIS — E669 Obesity, unspecified: Secondary | ICD-10-CM | POA: Insufficient documentation

## 2011-03-18 DIAGNOSIS — I4891 Unspecified atrial fibrillation: Secondary | ICD-10-CM | POA: Insufficient documentation

## 2011-03-18 DIAGNOSIS — J342 Deviated nasal septum: Secondary | ICD-10-CM | POA: Insufficient documentation

## 2011-03-18 DIAGNOSIS — J3501 Chronic tonsillitis: Secondary | ICD-10-CM | POA: Insufficient documentation

## 2011-03-18 DIAGNOSIS — G4730 Sleep apnea, unspecified: Secondary | ICD-10-CM | POA: Insufficient documentation

## 2011-03-18 DIAGNOSIS — Z01812 Encounter for preprocedural laboratory examination: Secondary | ICD-10-CM | POA: Insufficient documentation

## 2011-03-18 DIAGNOSIS — G4733 Obstructive sleep apnea (adult) (pediatric): Secondary | ICD-10-CM | POA: Insufficient documentation

## 2011-03-18 LAB — PROTIME-INR
INR: 1.04 (ref 0.00–1.49)
Prothrombin Time: 13.8 seconds (ref 11.6–15.2)

## 2011-03-22 NOTE — Op Note (Signed)
[name], [name] NO.:  [id number]  MEDICAL RECORD NO.:  [id number]  LOCATION:  3306                         FACILITY:  [facility]  PHYSICIAN:  Minna Merritts, M.D.   DATE OF BIRTH:  [birth year]  DATE OF PROCEDURE: DATE OF DISCHARGE:                              OPERATIVE REPORT   PREOPERATIVE DIAGNOSES:  Sleep apnea with continuous positive airway pressure failure with septal deviation, turbinate hypertrophy, tonsillar hypertrophy with tonsillitis history with redundant uvula and palate. Body mass index of 56, apnea-hypopnea index of 114 per hour.  SECOND DIAGNOSES: 1. Atrial fibrillation. 2. History of stroke.  POSTOPERATIVE DIAGNOSES:  Sleep apnea with continuous positive airway pressure failure with septal deviation, turbinate hypertrophy, tonsillar hypertrophy with tonsillitis history with redundant uvula and palate. Body mass index of 56, apnea-hypopnea index of 114 per hour.  OPERATION:  Septal reconstruction, turbinate reduction, and a tonsillectomy with uvulopalatoplasty.  SURGEON:  Minna Merritts, MD  ANESTHESIOLOGIST:  Sharolyn Douglas, MD  The plan here after being cleared by Dr. Quay Burow or ENT surgery and also by Dr. Antony Contras, the patient having had a stroke in February, and the patient is aware of the risks and gains in terms of trying to gain a decent nasal airway and oral airway for her as she has failed CPAP.  PROCEDURE IN DETAIL:  The patient placed in supine position under general endotracheal anesthesia.  The nose was first approached using 200 mg of topical cocaine and 1% Xylocaine with epinephrine 4 mL.  We reduced her turbinates aggressively and also used the awl met to electrocauterize the bipolar, the redundant inferior turbinate membrane. We then made the incision in the right anterior aspect of her columella, carried it back along the floor of the nose and along the quadrilateral cartilage where the issue was  primarily the anterior portion cartilaginous and the floor of the nose where she had a large spur and some anterior nasal spur in the floor of the nose, telescoping where the septum was sitting next to the bony section, and this was therefore taken down using the 4-mm chisel.  Once we were able to taken down some of this bone in septal deviation, we were able to shift the quadrilateral cartilage over to the left in an effort to bring it back to the midline.  We also took a strip of quadrilateral cartilage with the ethmoid septum more posteriorly was in reasonably good condition. Therefore, columella and the anterior cartilage was the major problem with telescoping and bowing to the right.  Once this was achieved, we had a nice nasal airway in the small nose.  Intranasal dressing was placed after the closure was completed using 5-0 plain catgut and through-and-through septal suture using 4-0 plain.  The intranasal dressing was Merocel pack.  We then took our attention to the tonsillar region and we removed the tonsils using blunt and Bovie electrocoagulation dissection.  All hemostasis was established with Bovie electrocoagulation dissection and we also trimmed the uvula and palate bringing the palate to a higher level and trimming this very large uvula back to its normal status.  All hemostasis was established. The patient's stomach was  suctioned.  The attention was carried back to the nose where the packing was removed and we placed a #7-1/2 and a 6- 1/2 anesthesia trumpet at each side of her nose.  The patient was taken to recovery room, was very slow to wake up because of her excess weight of 300 pounds.  She had also a treatment with albuterol for some respiratory asthmatic effect, but she has recovered very nicely and is doing well postoperatively.  She is also because of the atrial fibrillation has been on Xarelto and this will be begun 3 days postoperatively after consultation with  Dr. Quay Burow.  The patient has done extremely well and is doing well postop, will be observed as an outpatient 23-hour and 3300.  Also her followup will be in 3 days and then in 10 days, 3 weeks, 6 weeks, 3 months, 6 months, and a year.          ______________________________ Minna Merritts, M.D.     JC/MEDQ  D:  03/18/2011  T:  03/18/2011  Job:  [id number]  cc:   Quay Burow, M.D. Clinton D. Annamaria Boots, MD, FCCP, [place] Panosh, MD Pramod P. Leonie Man, MD Shelva Majestic, M.D.  Electronically Signed by Minna Merritts M.D. on 03/22/2011 08:59:34 AM

## 2011-03-22 NOTE — Discharge Summary (Signed)
NAMEMALON, SIDDALL NO.:  [id number]  MEDICAL RECORD NO.:  [id number]  LOCATION:  3306                         FACILITY:  [facility]  PHYSICIAN:  Minna Merritts, M.D.   DATE OF BIRTH:  September 30, 1950  DATE OF ADMISSION:  03/18/2011 DATE OF DISCHARGE:                              DISCHARGE SUMMARY   This patient is a 60 year old female who has had a sleep study showing AHI of 114, a BMI of 54.  She had a nadir of her O2 down to 74%.  She started at 97% and is a failed CPAP candidate because she has had nasal surgery in the past.  Also had nasal trauma in the past and has quite a severe septal deviation to the right in this very small nose.  She also has quite large tonsils and has had a history of tonsillitis, but also has tonsils that are enlarged and a small oral cavity with a very long uvula.  She has also a weight of 300 pounds.  She has had 4 knee operations, 3 in the left, 1 in the right, and she is a failed CPAP patient who has made an effort to lose weight but I think has not been able to exercise enough to decrease her weight status.  She also has atrial fibrillation and has been cared for by the cardiac physicians Dr. Claiborne Billings and Dr. Gwenlyn Found and has been on anticoagulants and is now off these temporarily.  She also has been seen by Dr. Leonie Man as she had a slight stroke in early February.  Her allergies are one of DIPHENHYDRAMINE and IBUPROFEN.  Otherwise she has been on antibiotics for some sinusitis in the past but now enters for a septal reconstruction, turbinate reduction, and a palatopharyngoplasty and tonsillectomy under general endotracheal anesthesia.  She had been cleared by the cardiac physicians as a low risk patient.  Her medications are one of simvastatin 10, Cymbalta, Synthroid, Xarelto 20.  She also has history of dyslipidemia, hypertension.  Her blood pressure is recently 132/74.  Her neurologic workup was unrevealing.  She said she had some slight  tingling in her leg, but she has also had several knee operation.  She appears to be in excellent condition.  She does have some left ventricular hypertrophy on her chest x-ray and on her EKG evaluation.  She runs asymptomatic paroxysmal atrial fibrillation but has been cleared for surgery.  Her further past history is unknown allergy to the DIPHENHYDRAMINE causing itching and IBUPROFEN causing swelling in her ankles.  She had a right total knee, left total knee, and then 2 other revisions and then a total knee revision.  She has had eye surgery.  She has also had arthroplasty of her knee.  She also has had left shoulder surgery in 2011.  She has had no previous anesthesia problems with all these orthopedic procedures.  Drinks occasional alcohol.  Does not smoke.  She has sleep apnea.  She had some caps on her teeth but had no intubation problems. She has had a stress test with cardiovascular evaluation in 2012 which is included here, echocardiogram in 2012 as well.  The echocardiogram is showing left  ventricular increased cavity size within normal limits. Wall thickness increased.  Left ventricular hypertrophy, systolic function was normal.  Estimated ejection fraction is 60-65%.  Wall action normal.  Mitral valve calcified annually.  Left atrium was in good status.  Remainder of the valves were as stated.  Aorta was normal. She did have some calcification on her valves.  Her medications by Dr. Gwenlyn Found and Dr. [name] are atenolol 50, simvastatin 10, Cymbalta, Synthroid, and Xarelto 20.  Her lab reveals the pro time was 13.3, INR 1.04.  She has been off the Xarelto for 3 days, hemoglobin 13.1, hematocrit 39.3 and her previous pro time before we came off the Xarelto was 20.6 and INR 1.73.  The PTT was 43.  Her electrolytes, potassium was 4.6.  The remainder were well within normal limits.  Urinalysis was negative and her MRSA and staph aureus were totally negative.  Her physical  examination reveals a well-nourished 300 pound female with a BMI of 54.9, blood pressure was 143/60.  Ears, tympanic membranes are clear.  The nose is very narrow.  Has had surgery in the past and a septal deviation to the right, turbinate hypertrophy.  Palate is also very low.  Uvula is twice the size of her normal uvula, typical of a snore in a sleep apnea patient and she has tonsillitis history and tonsillar hypertrophy.  Her larynx is clear.  True cords, false cords, epiglottis, base of tongue, lateral pharyngeal walls are completely clear of ulceration, mass or lesion and true cord mobility, gag reflex, tongue mobility, EOMs, facial nerve are all symmetrical.  Her neck is full.  No cervical adenopathy or mass.  Posterior triangles and scalp and skin are clear.  Chest clear.  No rales, rhonchi, or wheezes. Cardiovascular, normal S1, S2.  No murmurs, rubs, or gallops. Extremities, above-mentioned knee surgeries and shoulder surgery left. She is a failed CPAP because of her septal deviation in her very small nose and now enters for a septal reconstruction, turbinate reduction to gain some nasal airway status and tonsillectomy, uvulopalatoplasty to raise the contour of the palate and give her some space in this very small mouth.  Her further diagnoses are atrial fibrillation, history of stroke, sleep apnea, history of 4 knee surgeries, history of left shoulder surgery, history of cardiomegaly, obesity, and elevated cholesterol, history of an allergy to DIPHENHYDRAMINE and IBUPROFEN and our operative procedure went very well.  She had a septal reconstruction, turbinate reduction, and a UPPP and a tonsillectomy. She did very well during and postoperatively, was treated for an asthma with [place] briefly in the recovery room but no real long-term problem, postoperatively came right back up to a 98% O2 on room air and has done very well.  Over the evening, she was watched carefully in  3300 where her pulse oximeter was documented to never fall below 89.  She said she had 1 episode which she woke her up and she thought she is on 75 on the machine but she was never given oxygen, has really felt very free of symptoms, has some sore throat, some fullness but is swallowing and eating well and wants to go home and I think she can do very well postop.  I think she can use some Afrin in her nose to help speed up the shrinking of the membranes of her nasal nose and also use a breathing nasal strips.  She has a very delicate small narrow nose.  She will be followed in my office  on Monday.  I will cleanse her nose and we will evaluate further and she will have someone with her as well and she will sit up at this time when she is trying to sleep.  Her final diagnoses are sleep apnea with septal deviation, turbinate hypertrophy with tonsillar hypertrophy, history of tonsillitis with low uvula and palate, failed CPAP, AHI of 114, lowest O2 74%, and a BMI of 54.9, atrial fibrillation, 4 knee surgeries, shoulder surgery left, obesity, dyslipidemia, history of stroke.          ______________________________ Minna Merritts, M.D.     JC/MEDQ  D:  03/19/2011  T:  03/19/2011  Job:  [id number]  cc:   Quay Burow, M.D. Standley Brooking. Regis Bill, MD Clinton D. Young, MD, FCCP, FACP Pramod P. Leonie Man, MD Shelva Majestic, M.D.  Electronically Signed by Minna Merritts M.D. on 03/22/2011 08:59:36 AM

## 2011-03-22 NOTE — H&P (Signed)
[name], [name] NO.:  [id number]  MEDICAL RECORD NO.:  [id number]  LOCATION:  86                         FACILITY:  [facility]  PHYSICIAN:  Minna Merritts, M.D.   DATE OF BIRTH:  March 10, 1951  DATE OF ADMISSION:  03/18/2011 DATE OF DISCHARGE:                             HISTORY & PHYSICAL   This patient is a 60 year old female who has had a sleep study showing an AHI of 114 and a BMI of 54 and a O2 nadir of 74% starting at 97%. She also has been a failed CPAP candidate, where she has a septal deviation and a very small nose.  She also has tonsils that are very large and has had tonsillitis in the past as well, but she is getting in an adequate night sleep.  She is continuing to gain weight at 300 pounds and with a failed CPAP, she is a candidate for a septal reconstruction turbinate reduction and a tonsillectomy along with palatal pharyngoplasty.  She also has a history of atrial fibrillation and has had a stroke of mild proportion back in February 2012, and has been evaluated and cleared for surgery by Dr. Leonie Man.  She also has seen Dr. Quay Burow who has done a complete workup on her, where her medications are atenolol 50 mg in the morning, 25 mg in the evening, simvastatin 10, Cymbalta, Synthroid and Xarelto 20 mg for her anticoagulation.  She has had a history of dyslipidemia and hypertension.  Her blood pressure more recently is 132/74.  Her neurologic workup was unrevealing.  She appeared to have this in good condition.  She has no evidence of any PFO, though she did have some left ventricular hypertrophy.  She runs some asymptomatic paroxysmal atrial fibrillation and she has been cleared for surgery, low risk of ENT surgery with a negative Myoview.  Her further past history is she has an allergy to DIPHENHYDRAMINE causing itching and IBUPROFEN causing swelling of her ankles.  She has had a right total knee and a left total knee and apparently  she has had 2 other revisions according to her statements.  She had a left total knee revision.  She has had eye surgery to correct a lazy eye years ago and Dr. Wynelle Link has also done a arthroplasty of her knee.  She had a left shoulder surgery in 2011.  She has had no previous anesthesia problems.  She does not use cigarettes.  She does drink occasional alcohol.  She has the sleep apnea.  She had a crown in her upper and lower front teeth.  She has had previous intubations with no problem. Her last stress test with cardiovascular was in 2012.  She was cleared. She also had an echocardiogram in 2012 with the only finding of the atrial fibrillation.  She has had a stroke in February 2012.  No GI problems or thyroid.  She is on thyroid medication.  She does have chronic back pain, takes vitamin B12 injections.  Her polysomnogram is included in the chart and above-mentioned.  Her physical examination, she is a well-nourished, 300 pounds lady who has a BMI of 54.9 and 1 read out 56 and  another.  Her blood pressure is 143/80.  In one review, her ears are completely cleared.  Tympanic membranes are clear.  Her nose shows a septal deviation and a very small nose which appears to have been traumatized in the past and also turbinate hypertrophies.  She would not be a candidate for CPAP.  Her mouth is extremely small as well with tonsillar hypertrophy.  She has had history of tonsillitis.  She has a very large uvula and a low palate.  Her larynx is clear.  True cords, false cords, epiglottis, base of tongue, lateral pharyngeal walls are completely clear of ulceration, mass or lesion and true cord mobility, gag reflex, EOMs, facial nerve are all symmetrical as is shoulder strength.  She is oriented x3.  She does complain about some leg weakness secondary to her stroke, but she also has had 4 knee operations, 3 on the left and 1 on the right. Abdomen is obese.  Chest is clear.  No rales, rhonchi or  wheezes. Cardiovascular, no wheezes, murmurs or gallops, but history of atrial fibrillation as shown in the EKG.  Her initial diagnosis is that of sleep apnea with septal deviation, turbinate hypertrophy, tonsillar hypertrophy, failed CPAP, low redundant palate, AHI of 114, lowest O2 of 74, BMI of 50-56, history of stroke 2 with minimal sequelae in February 2012, history of 4 knee operations, 3 in the left, 1 in the right total knee, history of total shoulder surgery, history of atrial fibrillation, history of obesity 300 pounds.  Our plan is to do a septal reconstruction, turbinate reduction to gain some nasal airway and then a tonsillectomy and uvulopalatoplasty to gain some oral airway, so she can avoid CPAP or at least use it in the future if she needs to.  Thank you very much.          ______________________________ Minna Merritts, M.D.     JC/MEDQ  D:  03/18/2011  T:  03/18/2011  Job:  [id number]  cc:   Quay Burow, M.D. Pramod P. Leonie Man, MD Clinton D. Annamaria Boots, MD, FCCP, [place] Regis Bill, MD  Electronically Signed by Minna Merritts M.D. on 03/22/2011 08:59:31 AM

## 2011-04-05 ENCOUNTER — Ambulatory Visit: Payer: BC Managed Care – PPO | Admitting: Physical Medicine & Rehabilitation

## 2011-04-05 ENCOUNTER — Encounter: Payer: BC Managed Care – PPO | Attending: Physical Medicine & Rehabilitation

## 2011-04-05 DIAGNOSIS — M7989 Other specified soft tissue disorders: Secondary | ICD-10-CM | POA: Insufficient documentation

## 2011-04-05 DIAGNOSIS — E039 Hypothyroidism, unspecified: Secondary | ICD-10-CM | POA: Insufficient documentation

## 2011-04-05 DIAGNOSIS — I69959 Hemiplegia and hemiparesis following unspecified cerebrovascular disease affecting unspecified side: Secondary | ICD-10-CM | POA: Insufficient documentation

## 2011-04-05 DIAGNOSIS — Z96659 Presence of unspecified artificial knee joint: Secondary | ICD-10-CM | POA: Insufficient documentation

## 2011-04-05 DIAGNOSIS — I6330 Cerebral infarction due to thrombosis of unspecified cerebral artery: Secondary | ICD-10-CM

## 2011-04-05 DIAGNOSIS — I10 Essential (primary) hypertension: Secondary | ICD-10-CM | POA: Insufficient documentation

## 2011-04-05 DIAGNOSIS — I69998 Other sequelae following unspecified cerebrovascular disease: Secondary | ICD-10-CM | POA: Insufficient documentation

## 2011-04-05 DIAGNOSIS — R29898 Other symptoms and signs involving the musculoskeletal system: Secondary | ICD-10-CM | POA: Insufficient documentation

## 2011-04-05 NOTE — Assessment & Plan Note (Signed)
REASON FOR VISIT:  History of right frontal CVA.  HISTORY:  A 60 year old female with history of right CVA causing left hemiparesis, inpatient rehabilitation at [facility] as well as outpatient rehab.  This was all done earlier this spring.  She has completed this.  She has gone back and followed up with Dr. Leonie Man.  She has had a recent uvuloplasty surgery for her sleep apnea with Dr. Ernesto Rutherford.  Walking tolerance is 5-10 minutes.  She climbs steps.  She drives.  She feels like she has poor endurance, would like to go back to work part- time once her endurance improves.  SOCIAL HISTORY:  Divorced, lives alone.  Nonsmoker and nondrinker.  FAMILY HISTORY:  Heart disease and  hypertension.  Current exercise program none.  She does get in the pool from time to time.  I have not resumed any kind of pool therapy.  PHYSICAL EXAMINATION:  GENERAL:  A morbidly obese female in no acute distress. VITAL SIGNS:  Blood pressure 159/69, pulse 91, respirations 18, and O2 saturation 98% on room air. EXTREMITIES:  Without edema. NEUROLOGIC:  Mood and affect appropriate.  Her sensation in left lower extremity is normal.  Strength is normal in bilateral upper and lower extremities.  Gait shows no evidence toe drag or knee instability.  IMPRESSION: 1. Right frontal cerebrovascular accident.  Left lower extremity has     improved, had some sensory problems which sounded radicular     although it could have been related to her stroke as well.  She has     had no focal neurologic deficits on the most current examination. 2. Morbid obesity risk factor for stroke in terms of activity level as     well.  We discussed need for regular exercise program.  She will be     doing some pool exercises on her own but I have recommended that     she resume her [employer] pool aquatic aerobics.  Discussed with the     patient, agrees with plan.  We also discussed nutritional consult     for weight loss over at [facility].  The patient     agrees with plan.  I will see her back in about 4 months.     Charlett Blake, M.D. Electronically Signed    AEK/MedQ D:  04/05/2011 13:39:10  T:  04/05/2011 23:07:04  Job #:  [id number]  cc:   Pramod P. Leonie Man, MD Fax: [phone]  Standley Brooking. Regis Bill, MD [address] [place], [place] [address]

## 2011-04-19 ENCOUNTER — Ambulatory Visit (INDEPENDENT_AMBULATORY_CARE_PROVIDER_SITE_OTHER): Payer: BC Managed Care – PPO | Admitting: Internal Medicine

## 2011-04-19 DIAGNOSIS — D518 Other vitamin B12 deficiency anemias: Secondary | ICD-10-CM

## 2011-04-19 MED ORDER — CYANOCOBALAMIN 1000 MCG/ML IJ SOLN
1000.0000 ug | Freq: Once | INTRAMUSCULAR | Status: AC
  Administered 2011-04-19: 1000 ug via INTRAMUSCULAR

## 2011-05-03 ENCOUNTER — Other Ambulatory Visit: Payer: Self-pay | Admitting: Neurology

## 2011-05-03 DIAGNOSIS — M549 Dorsalgia, unspecified: Secondary | ICD-10-CM

## 2011-05-03 DIAGNOSIS — G8929 Other chronic pain: Secondary | ICD-10-CM

## 2011-05-10 ENCOUNTER — Ambulatory Visit
Admission: RE | Admit: 2011-05-10 | Discharge: 2011-05-10 | Disposition: A | Discharge status: Home / Self Care | Payer: BC Managed Care – PPO | Source: Ambulatory Visit | Attending: Neurology | Admitting: Neurology

## 2011-05-10 DIAGNOSIS — G8929 Other chronic pain: Secondary | ICD-10-CM

## 2011-05-10 DIAGNOSIS — M549 Dorsalgia, unspecified: Secondary | ICD-10-CM

## 2011-05-17 ENCOUNTER — Telehealth: Payer: Self-pay | Admitting: Internal Medicine

## 2011-05-17 ENCOUNTER — Ambulatory Visit (INDEPENDENT_AMBULATORY_CARE_PROVIDER_SITE_OTHER): Payer: BC Managed Care – PPO | Admitting: Internal Medicine

## 2011-05-17 ENCOUNTER — Encounter: Payer: Self-pay | Admitting: Internal Medicine

## 2011-05-17 VITALS — BP 120/80 | HR 82 | Temp 98.6°F

## 2011-05-17 DIAGNOSIS — D518 Other vitamin B12 deficiency anemias: Secondary | ICD-10-CM

## 2011-05-17 DIAGNOSIS — E785 Hyperlipidemia, unspecified: Secondary | ICD-10-CM

## 2011-05-17 DIAGNOSIS — I10 Essential (primary) hypertension: Secondary | ICD-10-CM

## 2011-05-17 DIAGNOSIS — R059 Cough, unspecified: Secondary | ICD-10-CM

## 2011-05-17 DIAGNOSIS — Z8673 Personal history of transient ischemic attack (TIA), and cerebral infarction without residual deficits: Secondary | ICD-10-CM

## 2011-05-17 DIAGNOSIS — R05 Cough: Secondary | ICD-10-CM

## 2011-05-17 DIAGNOSIS — I4891 Unspecified atrial fibrillation: Secondary | ICD-10-CM

## 2011-05-17 DIAGNOSIS — G4733 Obstructive sleep apnea (adult) (pediatric): Secondary | ICD-10-CM

## 2011-05-17 MED ORDER — LEVOTHYROXINE SODIUM 75 MCG PO TABS: 75.0000 ug | ORAL_TABLET | Freq: Every day | ORAL | Status: DC

## 2011-05-17 MED ORDER — AZITHROMYCIN 250 MG PO TABS: ORAL_TABLET | ORAL | Status: AC

## 2011-05-17 MED ORDER — LISINOPRIL-HYDROCHLOROTHIAZIDE 20-25 MG PO TABS: 1.0000 | ORAL_TABLET | Freq: Every day | ORAL | Status: DC

## 2011-05-17 MED ORDER — ATENOLOL 50 MG PO TABS: ORAL_TABLET | ORAL | Status: DC

## 2011-05-17 MED ORDER — CYANOCOBALAMIN 1000 MCG/ML IJ SOLN: 1000.0000 ug | Freq: Once | INTRAMUSCULAR | Status: DC

## 2011-05-17 NOTE — Telephone Encounter (Signed)
Ope in error

## 2011-05-17 NOTE — Progress Notes (Signed)
  Subjective:    Patient ID: [name], female    DOB: [birth year], 60 y.o.   MRN: [id number]  HPI Comes in for acute visit   For 3 days of cough. Worried about  Bronchitis.     NO fever cp sob  Also supposed to come in nex week anyway for CDM. Update: Neuro:  Doing better but having problems with leg that may be from lumbar spine disease Recent MRI back   Top of foot has pain.   Pending Dr Leonie Man AF NO bleeding  Sees Dr Gwenlyn Found on lipid meds  Getting around well  Uses cane for step or uneven ground.  Did fall on rocks in parking lot  No injury OSA: had ent surgery and doing better  Breathing at night better and can close mouth now.  HT: seems stable THyroid no change OBESITY: aware of need to losing weight.  Applied for social security disability  pending Review of Systems NO fever bleeding  Wheezing has some pndarainge no face pain.  Mood stable. Past history family history social history reviewed in the electronic medical record.     Objective:   Physical Exam WDWN in nad  HEENT: Normocephalic ;atraumatic , Eyes;  PERRL, EOMs  Full, lids and conjunctiva clear,,Ears: no deformities, canals nl, TM landmarks normal, Nose: no deformity or dischargeslight congestion  Mouth : OP clear without lesion or edema . Neck: Supple without adenopathy or masses or bruits Chest:  Clear to A&P without wheezes rales or rhonchi CV:  S1-S2 no gallops peripheral perfusion is normal No clubbing cyanosis or edema MS prefers to sit with left leg  Favored .  Gait with cane  Skin no bruising or bleeding  Record review     Assessment & Plan:  Cough new onset seems viral but could have some allergy component  HT  stable S/p CVA poss ebolic from AF  On anticoagulatsion no se.  Caution with topical nsaids for bleed LEft leg pain and numbness  Poss  Radicular disease  Under eval by neuro OSA better after surgery Anemia  Hyperglycemia will check at follow up.  Plan to move ROV to 6 -8 weeks from now.

## 2011-05-17 NOTE — Patient Instructions (Addendum)
No evidence of pneumonia for  now  In case of any allergy drainage restart the  flonase like nose spray.   To  Possible  help controller drainage. Call fever chest pain  Can begin antibiotic if getting worse and having infected   Mucous. Call if needed  ROV in 6 weeks   Ov and flu shot.  And med review

## 2011-05-24 ENCOUNTER — Ambulatory Visit: Payer: BC Managed Care – PPO | Admitting: Internal Medicine

## 2011-06-02 LAB — POCT URINALYSIS DIP (DEVICE)
Glucose, UA: 500 — AB
Ketones, ur: 15 — AB
Nitrite: POSITIVE — AB
Operator id: 282151

## 2011-06-02 LAB — URINE CULTURE: Colony Count: >=100000

## 2011-06-03 LAB — CBC
MCHC: 32.9
MCV: 92.1
Platelets: 255
RDW: 14.9

## 2011-06-03 LAB — URINALYSIS, ROUTINE W REFLEX MICROSCOPIC
Glucose, UA: NEGATIVE
Ketones, ur: NEGATIVE
Protein, ur: NEGATIVE
Urobilinogen, UA: 0.2

## 2011-06-03 LAB — BASIC METABOLIC PANEL
BUN: 17
CO2: 31
Calcium: 9.1
Chloride: 97
Creatinine, Ser: 0.95
Glucose, Bld: 127 — ABNORMAL HIGH

## 2011-06-06 NOTE — Patient Instructions (Addendum)
   Your procedure is scheduled on: Monday October 8th  Enter through the [facility] at: 9:15 am Pick up the phone at the desk and dial [phone] and inform us of your arrival  Please call this number if you have any problems the morning of surgery: [phone]  Remember: Do not eat food after midnight  Sunday 10/7th Do not drink clear liquids after:midnight  Sunday 10/7th Take these medicines the morning of surgery with a SIP OF WATER: per anesthesia instructions  Do not wear jewelry, make-up, or FINGER nail polish Do not wear lotions, powders, or perfumes.  You may wear deodorant. Do not shave 48 hours prior to surgery. Do not bring valuables to the hospital.  Patients discharged on the day of surgery will not be allowed to drive home.   Name and phone number of your driver:  Sister Aliene Altes   cell [phone]   Remember to use your hibiclens as instructed.Please shower with 1/2 bottle the evening before your surgery and the other 1/2 bottle the morning of surgery.

## 2011-06-07 ENCOUNTER — Encounter (HOSPITAL_COMMUNITY): Payer: Self-pay

## 2011-06-07 ENCOUNTER — Encounter (HOSPITAL_COMMUNITY)
Admission: RE | Admit: 2011-06-07 | Discharge: 2011-06-07 | Disposition: A | Discharge status: Home / Self Care | Payer: BC Managed Care – PPO | Source: Ambulatory Visit | Attending: Obstetrics & Gynecology | Admitting: Obstetrics & Gynecology

## 2011-06-07 HISTORY — DX: Other symptoms and signs involving the musculoskeletal system: R29.898

## 2011-06-07 HISTORY — DX: Unspecified osteoarthritis, unspecified site: M19.90

## 2011-06-07 HISTORY — DX: Unspecified atrial fibrillation: I48.91

## 2011-06-07 HISTORY — DX: Cerebral infarction, unspecified: I63.9

## 2011-06-07 HISTORY — DX: Encounter for other specified aftercare: Z51.89

## 2011-06-07 HISTORY — DX: Hyperlipidemia, unspecified: E78.5

## 2011-06-07 HISTORY — DX: Reserved for inherently not codable concepts without codable children: IMO0001

## 2011-06-07 HISTORY — DX: Sleep apnea, unspecified: G47.30

## 2011-06-07 LAB — CBC
HCT: 38.6 % (ref 36.0–46.0)
HCT: 25.6 — ABNORMAL LOW
HCT: 31 — ABNORMAL LOW
HCT: 37.6
Hemoglobin: 10.3 — ABNORMAL LOW
Hemoglobin: 8.8 — ABNORMAL LOW
Hemoglobin: 9.1 — ABNORMAL LOW
MCHC: 32.1 g/dL (ref 30.0–36.0)
MCHC: 34.2
MCV: 92.5
MCV: 92.6
Platelets: 202 10*3/uL (ref 150–400)
Platelets: 218
RBC: 2.77 — ABNORMAL LOW
RBC: 2.88 — ABNORMAL LOW
RBC: 3.3 — ABNORMAL LOW
RDW: 15.9 % — ABNORMAL HIGH (ref 11.5–15.5)
RDW: 14.5
WBC: 10.3
WBC: 8.3

## 2011-06-07 LAB — BASIC METABOLIC PANEL
BUN: 17 mg/dL (ref 6–23)
BUN: 21
BUN: 7
CO2: 29
Calcium: 8.1 — ABNORMAL LOW
Chloride: 95 — ABNORMAL LOW
Creatinine, Ser: 0.92 mg/dL (ref 0.50–1.10)
GFR calc Af Amer: 76 mL/min — ABNORMAL LOW (ref >90)
GFR calc Af Amer: >60
GFR calc Af Amer: >60
GFR calc Af Amer: >60
GFR calc non Af Amer: 66 mL/min — ABNORMAL LOW (ref >90)
GFR calc non Af Amer: 53 — ABNORMAL LOW
Potassium: 4.5 mEq/L (ref 3.5–5.1)
Potassium: 3.7
Potassium: 5.5 — ABNORMAL HIGH
Sodium: 133 — ABNORMAL LOW
Sodium: 133 — ABNORMAL LOW

## 2011-06-07 LAB — COMPREHENSIVE METABOLIC PANEL
Albumin: 3.5
BUN: 15
Creatinine, Ser: 0.92
Total Protein: 6.5

## 2011-06-07 LAB — TYPE AND SCREEN: ABO/RH(D): O POS

## 2011-06-07 LAB — APTT: aPTT: 32

## 2011-06-07 LAB — URINALYSIS, ROUTINE W REFLEX MICROSCOPIC
Nitrite: NEGATIVE
Specific Gravity, Urine: 1.015
Urobilinogen, UA: 0.2

## 2011-06-07 LAB — PROTIME-INR
INR: 1
INR: 1.4

## 2011-06-07 NOTE — Anesthesia Preprocedure Evaluation (Addendum)
Anesthesia Evaluation  Name, MR# and DOB Patient awake  General Assessment Comment  Reviewed: Allergy & Precautions, H&P , NPO status , Patient's Chart, lab work & pertinent test results, reviewed documented beta blocker date and time   History of Anesthesia Complications Negative for: history of anesthetic complications  Airway Mallampati: III TM Distance: >3 FB Neck ROM: Full    Dental No notable dental hx. (+) Teeth Intact   Pulmonary (+) shortness of breath (attributes to being overweight) and With exertion sleep apnea (no CPAP, had surgery (T&A, UPPP, septoplasty))  clear to auscultation  Pulmonary exam normal       Cardiovascular Exercise Tolerance: Good hypertension, On Home Beta Blockers + dysrhythmias Atrial Fibrillation Regular Normal    Neuro/Psych CVA thought due to atrial fibrillation. CVA (10/2010, weakness left leg, pain top of right foot.  Has been on xeralto, stopped thursday), Residual Symptoms Negative Psych ROS   GI/Hepatic negative GI ROS Neg liver ROS    Endo/Other  Negative Endocrine ROSHypothyroidism Morbid obesity  Renal/GU negative Renal ROS  Genitourinary negative   Musculoskeletal  (+) Arthritis - (back, knees, shoulders), Osteoarthritis,    Abdominal Normal abdominal exam  (+)   Peds  Hematology negative hematology ROS (+) Patient has been on Xeralto for anticoagulation for her atrial fibrillation.   Anesthesia Other Findings   Reproductive/Obstetrics negative OB ROS                         Anesthesia Physical Anesthesia Plan  ASA: III  Anesthesia Plan: MAC   Post-op Pain Management:    Induction: Intravenous  Airway Management Planned: Mask and Natural Airway  Additional Equipment:   Intra-op Plan:   Post-operative Plan: Extubation in OR  Informed Consent: I have reviewed the patients History and Physical, chart, labs and discussed the procedure  including the risks, benefits and alternatives for the proposed anesthesia with the patient or authorized representative who has indicated his/her understanding and acceptance.   Dental advisory given and Dental Advisory Given  Plan Discussed with: Anesthesiologist, CRNA and Surgeon  Anesthesia Plan Comments:        Anesthesia Quick Evaluation

## 2011-06-07 NOTE — Pre-Procedure Instructions (Signed)
Copies of EKG from 03/2011 and 10/2010 on chart, Dr Royce Macadamia to see patient.

## 2011-06-12 MED ORDER — CEFAZOLIN SODIUM-DEXTROSE 2-3 GM-% IV SOLR
2.0000 g | INTRAVENOUS | Status: AC
  Administered 2011-06-13: 2 g via INTRAVENOUS
  Filled 2011-06-12: qty 50

## 2011-06-13 ENCOUNTER — Ambulatory Visit (HOSPITAL_COMMUNITY): Payer: BC Managed Care – PPO | Admitting: Anesthesiology

## 2011-06-13 ENCOUNTER — Encounter (HOSPITAL_COMMUNITY): Payer: Self-pay | Admitting: Anesthesiology

## 2011-06-13 ENCOUNTER — Ambulatory Visit (HOSPITAL_COMMUNITY)
Admission: RE | Admit: 2011-06-13 | Discharge: 2011-06-13 | Disposition: A | Discharge status: Home / Self Care | Payer: BC Managed Care – PPO | Source: Ambulatory Visit | Attending: Obstetrics & Gynecology | Admitting: Obstetrics & Gynecology

## 2011-06-13 ENCOUNTER — Other Ambulatory Visit: Payer: Self-pay

## 2011-06-13 ENCOUNTER — Encounter (HOSPITAL_COMMUNITY)
Admission: RE | Disposition: A | Discharge status: Home / Self Care | Payer: Self-pay | Source: Ambulatory Visit | Attending: Obstetrics & Gynecology

## 2011-06-13 ENCOUNTER — Other Ambulatory Visit: Payer: Self-pay | Admitting: Obstetrics & Gynecology

## 2011-06-13 ENCOUNTER — Encounter (HOSPITAL_COMMUNITY): Payer: Self-pay | Admitting: *Deleted

## 2011-06-13 DIAGNOSIS — N840 Polyp of corpus uteri: Secondary | ICD-10-CM | POA: Insufficient documentation

## 2011-06-13 DIAGNOSIS — Z01818 Encounter for other preprocedural examination: Secondary | ICD-10-CM | POA: Insufficient documentation

## 2011-06-13 DIAGNOSIS — I10 Essential (primary) hypertension: Secondary | ICD-10-CM | POA: Insufficient documentation

## 2011-06-13 DIAGNOSIS — N950 Postmenopausal bleeding: Secondary | ICD-10-CM | POA: Insufficient documentation

## 2011-06-13 DIAGNOSIS — Z01812 Encounter for preprocedural laboratory examination: Secondary | ICD-10-CM | POA: Insufficient documentation

## 2011-06-13 HISTORY — PX: HYSTEROSCOPY WITH D & C: SHX1775

## 2011-06-13 SURGERY — DILATATION AND CURETTAGE /HYSTEROSCOPY
Anesthesia: Monitor Anesthesia Care | Site: Uterus | Wound class: Clean Contaminated

## 2011-06-13 MED ORDER — LIDOCAINE HCL 1 % IJ SOLN
INTRAMUSCULAR | Status: DC | PRN
  Administered 2011-06-13: 10 mL

## 2011-06-13 MED ORDER — ESMOLOL HCL 10 MG/ML IV SOLN
INTRAVENOUS | Status: DC | PRN
  Administered 2011-06-13: 20 mg via INTRAVENOUS

## 2011-06-13 MED ORDER — LIDOCAINE HCL (CARDIAC) 20 MG/ML IV SOLN
INTRAVENOUS | Status: AC
  Filled 2011-06-13: qty 5

## 2011-06-13 MED ORDER — FENTANYL CITRATE 0.05 MG/ML IJ SOLN
INTRAMUSCULAR | Status: AC
  Filled 2011-06-13: qty 2

## 2011-06-13 MED ORDER — ONDANSETRON HCL 4 MG/2ML IJ SOLN
INTRAMUSCULAR | Status: DC | PRN
  Administered 2011-06-13: 4 mg via INTRAVENOUS

## 2011-06-13 MED ORDER — PROPOFOL 10 MG/ML IV EMUL
INTRAVENOUS | Status: AC
  Filled 2011-06-13: qty 20

## 2011-06-13 MED ORDER — PHENYLEPHRINE 40 MCG/ML (10ML) SYRINGE FOR IV PUSH (FOR BLOOD PRESSURE SUPPORT)
PREFILLED_SYRINGE | INTRAVENOUS | Status: AC
  Filled 2011-06-13: qty 10

## 2011-06-13 MED ORDER — GLYCINE 1.5 % IR SOLN
Status: DC | PRN
  Administered 2011-06-13: 3000 mL

## 2011-06-13 MED ORDER — MIDAZOLAM HCL 5 MG/5ML IJ SOLN
INTRAMUSCULAR | Status: DC | PRN
  Administered 2011-06-13: 2 mg via INTRAVENOUS

## 2011-06-13 MED ORDER — PROPOFOL 10 MG/ML IV EMUL
INTRAVENOUS | Status: AC
  Filled 2011-06-13: qty 40

## 2011-06-13 MED ORDER — ONDANSETRON HCL 4 MG/2ML IJ SOLN
INTRAMUSCULAR | Status: AC
  Filled 2011-06-13: qty 2

## 2011-06-13 MED ORDER — PHENYLEPHRINE HCL 10 MG/ML IJ SOLN
INTRAMUSCULAR | Status: DC | PRN
  Administered 2011-06-13 (×4): 40 ug via INTRAVENOUS
  Administered 2011-06-13: 20 ug via INTRAVENOUS

## 2011-06-13 MED ORDER — LIDOCAINE HCL (CARDIAC) 20 MG/ML IV SOLN
INTRAVENOUS | Status: DC | PRN
  Administered 2011-06-13: 50 mg via INTRAVENOUS

## 2011-06-13 MED ORDER — LACTATED RINGERS IV SOLN
INTRAVENOUS | Status: DC
  Administered 2011-06-13: 12:00:00 via INTRAVENOUS
  Administered 2011-06-13: 100 mL via INTRAVENOUS

## 2011-06-13 MED ORDER — MIDAZOLAM HCL 2 MG/2ML IJ SOLN
INTRAMUSCULAR | Status: AC
  Filled 2011-06-13: qty 2

## 2011-06-13 MED ORDER — FENTANYL CITRATE 0.05 MG/ML IJ SOLN: 25.0000 ug | INTRAMUSCULAR | Status: DC | PRN

## 2011-06-13 MED ORDER — PROPOFOL 10 MG/ML IV EMUL
INTRAVENOUS | Status: DC | PRN
  Administered 2011-06-13 (×2): 40 mg via INTRAVENOUS
  Administered 2011-06-13: 150 mg via INTRAVENOUS
  Administered 2011-06-13 (×3): 20 mg via INTRAVENOUS

## 2011-06-13 MED ORDER — FENTANYL CITRATE 0.05 MG/ML IJ SOLN
INTRAMUSCULAR | Status: DC | PRN
  Administered 2011-06-13: 50 ug via INTRAVENOUS

## 2011-06-13 MED ORDER — ESMOLOL HCL 10 MG/ML IV SOLN
INTRAVENOUS | Status: AC
  Filled 2011-06-13: qty 10

## 2011-06-13 SURGICAL SUPPLY — 16 items
CANISTER SUCTION 2500CC (MISCELLANEOUS) ×3 IMPLANT
CATH ROBINSON RED A/P 16FR (CATHETERS) ×3 IMPLANT
CLOTH BEACON ORANGE TIMEOUT ST (SAFETY) ×3 IMPLANT
CONTAINER PREFILL 10% NBF 60ML (FORM) ×6 IMPLANT
DILATOR CANAL MILEX (MISCELLANEOUS) IMPLANT
ELECT REM PT RETURN 9FT ADLT (ELECTROSURGICAL)
ELECTRODE REM PT RTRN 9FT ADLT (ELECTROSURGICAL) IMPLANT
GLOVE BIOGEL PI IND STRL 7.0 (GLOVE) ×2 IMPLANT
GLOVE BIOGEL PI INDICATOR 7.0 (GLOVE) ×1
GLOVE ECLIPSE 6.5 STRL STRAW (GLOVE) ×6 IMPLANT
GOWN PREVENTION PLUS LG XLONG (DISPOSABLE) ×3 IMPLANT
GOWN STRL REIN XL XLG (GOWN DISPOSABLE) ×3 IMPLANT
LOOP ANGLED CUTTING 22FR (CUTTING LOOP) IMPLANT
PACK HYSTEROSCOPY LF (CUSTOM PROCEDURE TRAY) ×3 IMPLANT
TOWEL OR 17X24 6PK STRL BLUE (TOWEL DISPOSABLE) ×6 IMPLANT
WATER STERILE IRR 1000ML POUR (IV SOLUTION) ×3 IMPLANT

## 2011-06-13 NOTE — Anesthesia Postprocedure Evaluation (Signed)
Anesthesia Post Note  Patient: [name]  Procedure(s) Performed:  DILATATION AND CURETTAGE (D&C) /HYSTEROSCOPY  Anesthesia type: General  Patient location: PACU  Post pain: Pain level controlled  Post assessment: Post-op Vital signs reviewed  Last Vitals:  Filed Vitals:   06/13/11 1230  BP: 119/69  Pulse: 71  Temp: 97.9 F (36.6 C)  Resp: 22    Post vital signs: Reviewed  Level of consciousness: sedated  Complications: No apparent anesthesia complications  Difficult MAC, needed further sedation which required airway protection.  LMA placed without difficulty, but had delayed recovery of O2 sat due to obesity and decreased FRC.  Would not recommend MAC in future.  Charlton Haws, MD

## 2011-06-13 NOTE — H&P (Signed)
[name] is an 60 y.o. female G1P1, morbidly obese, with postmenopausal bleeding.  In office sonohysterogram showed two endometrial polyps.  Removal recommended via hysteroscopy.  Risks/benefits/procedure/recovery all discussed and documented in my office chart.  Patient here ready to proceed  Pertinent Gynecological History: Menses: post-menopausal Bleeding: post menopausal bleeding Contraception: post menopausal status DES exposure: denies Blood transfusions: none Sexually transmitted diseases: no past history Previous GYN Procedures: hysteroscopy with polyp resection 7/09  Last mammogram: normal Date: 2/11 Last pap: normal Date: 6/12 OB History: G1, P1   Menstrual History: Menarche age: 20 No LMP recorded. Patient is postmenopausal.    Past Medical History  Diagnosis Date  . Hypertension   . Hypothyroid   . Obesity   . Post-menopausal bleeding   . Anemia   . Osteoporosis   . B12 deficiency   . CVA (cerebral infarction) 2 19 2012     r frontal  thrombotic    . LVH (left ventricular hypertrophy)      by echo 2012  . Atrial fibrillation   . Stroke     10/2010  . Left leg weakness     r/t stroke 10/2010  . Sleep apnea     no cpap - surgery to removed tonsils/cut down uvula  . Blood transfusion     at pre-op appt 10/2, per pt, no hx of bld transfusion  . Arthritis     kness, back, shoulders - no meds  . Hyperlipidemia     Past Surgical History  Procedure Date  . Total knee arthroplasty 5427,0623, 2011    Lt 2001, rt 2007 and revision left 2011  . Rt shoulder surgery 06/2010  . Eye muscle surgery 1952    left eye  . Svd     x 1  . Dilation and curettage of uterus   . Colonoscopy   . Tonsillectomy   . Nose surgery     Family History  Problem Relation Age of Onset  . COPD Mother   . Hypertension Mother   . Osteoporosis Mother   . Diabetes Father   . Hypertension Father   . Liver cancer Father   . Hypertension Sister   . Hypertension Brother      Social History:  reports that she has never smoked. She has never used smokeless tobacco. She reports that she drinks alcohol. She reports that she does not use illicit drugs.  Allergies:  Allergies  Allergen Reactions  . Diphenhydramine Hcl Rash    Topical diphenhydramine    Prescriptions prior to admission  Medication Sig Dispense Refill  . atenolol (TENORMIN) 50 MG tablet Take 1 in am and 1/2 at night  135 tablet  3  . diclofenac sodium (VOLTAREN) 1 % GEL Apply topically.        . DULoxetine (CYMBALTA) 30 MG capsule Take 30 mg by mouth daily.        . ergocalciferol (VITAMIN D2) 50000 UNITS capsule Take 50,000 Units by mouth once a week.        . fish oil-omega-3 fatty acids 1000 MG capsule Take 2 g by mouth daily.        Marland Kitchen levothyroxine (SYNTHROID, LEVOTHROID) 75 MCG tablet Take 1 tablet (75 mcg total) by mouth daily.  90 tablet  3  . lisinopril-hydrochlorothiazide (PRINZIDE,ZESTORETIC) 20-25 MG per tablet Take 1 tablet by mouth daily.  90 tablet  3  . Rivaroxaban (XARELTO PO) Take by mouth.        . simvastatin (ZOCOR)  10 MG tablet Take 20 mg by mouth at bedtime.       . simvastatin (ZOCOR) 20 MG tablet Take 20 mg by mouth at bedtime.        . iron polysaccharides (NU-IRON) 150 MG capsule Take 150 mg by mouth daily.          Review of Systems  Constitutional: Negative for fever and chills.  Eyes: Negative for blurred vision and double vision.  Respiratory: Negative for cough.   Cardiovascular: Negative for chest pain and palpitations.  Gastrointestinal: Negative for heartburn, nausea and vomiting.  Genitourinary: Negative for dysuria and urgency.  Musculoskeletal: Positive for joint pain.  Skin: Negative for rash.  Neurological: Positive for speech change. Negative for dizziness and headaches.  Endo/Heme/Allergies: Bruises/bleeds easily.  Psychiatric/Behavioral: Negative for depression.    Blood pressure 124/71, pulse 79, temperature 98.4 F (36.9 C), temperature  source Oral, resp. rate 16, height _0  (1.575 m), weight 145.151 kg (320 lb), SpO2 98.00%. Physical Exam  Vitals reviewed. Constitutional: She is oriented to person, place, and time. She appears well-developed and well-nourished.  HENT:  Head: Normocephalic and atraumatic.  Neck: Normal range of motion. Neck supple. No thyromegaly present.  Cardiovascular: Normal rate, regular rhythm and normal heart sounds.   Respiratory: Effort normal and breath sounds normal.  GI: Soft. Bowel sounds are normal.  Genitourinary: Vagina normal and uterus normal.  Musculoskeletal: Normal range of motion.  Neurological: She is alert and oriented to person, place, and time.  Skin: Skin is warm and dry.  Psychiatric: She has a normal mood and affect.    No results found for this or any previous visit (from the past 24 hour(s)).  No results found.  Assessment/Plan: 60 yo G1P1 SWF with postmenopausal bleeding, two endometrial polyps here for hysteroscopy, polyp resection, D&C.  Ennifer Harston,M SUZANNE 06/13/2011, 10:27 AM

## 2011-06-13 NOTE — Transfer of Care (Signed)
Immediate Anesthesia Transfer of Care Note  Patient: [name]  Procedure(s) Performed:  DILATATION AND CURETTAGE (D&C) /HYSTEROSCOPY  Patient Location: PACU  Anesthesia Type: General  Level of Consciousness: awake, alert  and oriented  Airway & Oxygen Therapy: Patient Spontanous Breathing and Patient connected to nasal cannula oxygen  Post-op Assessment: Report given to PACU RN and Post -op Vital signs reviewed and stable  Post vital signs: Reviewed and stable  Complications: No apparent anesthesia complications

## 2011-06-13 NOTE — Op Note (Signed)
06/13/2011  12:07 PM  PATIENT:  [name]  60 y.o. female G1P1 with postmenopausal bleeding.  Sonohysterogram in the office showed two endometrial polyps.  Patient has PMHx significant for morbid obesity, hypothyroidism, morbid obesity, htn, OSA,, and afib.  She is on Xeralto.  After communicating with her cardiologist, she is to be off of this two days before surgery and resume the day after.  PRE-OPERATIVE DIAGNOSIS:  Post Menopausal Bleeding, Polyp  POST-OPERATIVE DIAGNOSIS:  Post Menopausal Bleeding, Polyp  PROCEDURE:  Procedure(s): DILATATION AND CURETTAGE (D&C) /HYSTEROSCOPY  SURGEON:  Michaele Amundson,M SUZANNE  ASSISTANTS: Or staff   ANESTHESIA:   LMA, Dr. Primitivo Gauze oversaw the procedure  ESTIMATED BLOOD LOSS: * No blood loss amount entered *  BLOOD ADMINISTERED:none   FLUIDS: 1400cc LR  UOP: 100cc (I&O cath at beginning of procedure)  SPECIMEN:  Endometrial curettings and polyp  DISPOSITION OF SPECIMEN:  PATHOLOGY  FINDINGS: endometrial polyps, otherwise thin appearing endometrium   DESCRIPTION OF OPERATION: Informed consent was present on the chart and the patient had a running IV present. She was taken to the operating room and initially placed in the supine position. The initial plan from anesthesia perspective was to start MAC anesthesia. The legs were positioned in the low lithotomy position in [place]. SCDs were present on the lower extremities bilaterally. Timeout was performed. Legs are lifted to the high lithotomy position. Betadine prep was used to prep the perineum inner thighs and vagina. Patient was draped in a normal standard fashion. A red rubber Foley catheter is used to drain the bladder of all urine.  A bivalve speculum was placed in the vagina. The anterior lip of the cervix was grasped with single-tooth tenaculum. The uterus sounded to 8 cm. A paracervical block of 1% lidocaine plain was used. 10 cc total was used. At this point the procedure anesthesia  was having trouble keeping the patient comfortable and so she was switched to an LMA. Her sats stayed in the mid 80s during this portion of the procedure due to the obesity of her upper extremities and positioning adequate ventilation was very difficult. The remainder of the procedure was done in a very timely fashion with close attention being paid to her ventilation.  The cervix is dilated up to a #23 using Pratt dilators. Using a 2.9 millimeter diagnostic hysteroscope, the endometrial cavity was visualized. A sizable polyp was noted in the midsection of the uterine cavity. Tubal ostia were noted bilaterally.  With several grasps using a polyp forceps, most of the polyp was removed. Using a #1 toothed curette endometrial cavity anteriorly was curetted until a rough gritty texture was noted. With revisualization of the endometrial cavity using the hysteroscope, there was a small amount polyp still remained.  Using a hysteroscopic grasper, this was grasped at the base until was fully removed.  The endometrial cavity was revisualized again. The polyp was completely removed. The #1 toothed curette was then used to curette the cavity again. At this point there was no significant tissue that was obtained. The polyp and endometrial curettings were sent together to pathology.  At this point the procedure was ended. The tenaculum was removed from the cervix. There is no bleeding noted from the tenaculum sites. The speculum was removed from the vagina. The legs were positioned back in the supine position. The Betadine prep was cleansed of the patient's skin. Sponge, lap, needle, and instrument counts were correct x2. The patient was stable this point and she was awakened from  anesthesia. She was and taken to recovery in stable condition.  COUNTS:  YES  PLAN OF CARE: Transfer to PACU

## 2011-06-13 NOTE — Progress Notes (Signed)
Called to bathroom by patient after getting dressed.  Two small open areas noted to left mid forearm, measuring 0.25 cm each.  Areas with scant bleeding.  Bandaid placed over open areas.  Pt with no c/o pain at sites.   Instructed pt to clean area with soap and water and to pat dry once home.

## 2011-06-14 ENCOUNTER — Encounter (HOSPITAL_COMMUNITY): Payer: Self-pay | Admitting: Obstetrics & Gynecology

## 2011-06-28 ENCOUNTER — Encounter: Payer: Self-pay | Admitting: Internal Medicine

## 2011-06-28 ENCOUNTER — Ambulatory Visit (INDEPENDENT_AMBULATORY_CARE_PROVIDER_SITE_OTHER): Payer: BC Managed Care – PPO | Admitting: Internal Medicine

## 2011-06-28 DIAGNOSIS — D518 Other vitamin B12 deficiency anemias: Secondary | ICD-10-CM

## 2011-06-28 DIAGNOSIS — Z23 Encounter for immunization: Secondary | ICD-10-CM

## 2011-06-28 DIAGNOSIS — J309 Allergic rhinitis, unspecified: Secondary | ICD-10-CM

## 2011-06-28 DIAGNOSIS — Z7901 Long term (current) use of anticoagulants: Secondary | ICD-10-CM

## 2011-06-28 DIAGNOSIS — R7309 Other abnormal glucose: Secondary | ICD-10-CM

## 2011-06-28 DIAGNOSIS — I10 Essential (primary) hypertension: Secondary | ICD-10-CM

## 2011-06-28 DIAGNOSIS — G4733 Obstructive sleep apnea (adult) (pediatric): Secondary | ICD-10-CM

## 2011-06-28 DIAGNOSIS — Z8673 Personal history of transient ischemic attack (TIA), and cerebral infarction without residual deficits: Secondary | ICD-10-CM

## 2011-06-28 MED ORDER — CYANOCOBALAMIN 1000 MCG/ML IJ SOLN
1000.0000 ug | Freq: Once | INTRAMUSCULAR | Status: AC
  Administered 2011-06-28: 1000 ug via INTRAMUSCULAR

## 2011-06-28 NOTE — Patient Instructions (Addendum)
Your  Sx may be from allergy  You could take an antihistamine with care or just if getting worse.  Avoid breads rice pastas unless whole grain, sweets and fried foods.  i think it is reasonable to consider the lap band  With intensive  lifestyle intervention: healthy eating and exercise .  Your blood sugar is ok today. Check  Follow up and b12 in 3 months and  Ov or as needed.  Check on shingles vaccine payment and call for nurse shot visit.

## 2011-06-28 NOTE — Progress Notes (Signed)
  Subjective:    Patient ID: [name], female    DOB: May 14, 1951, 60 y.o.   MRN: [id number]  HPI Patient comes in today for follow up of  multiple medical problems.  Since last  Visit    Had hysterosomy and  D and C.  Per Dr [name]   Obesity  Considering  Lap band.   Calling tomorrow.  Sleep better.  Cough much better has some allergy  Ur minor congestion  Anemia no bleeding getting b12 shots   Fu hyperglycemia Ate 3-4 hour  Ago.  Review of Systems Nocp p sob currently leg selling bleeding  Vision change . Independently mobile applying for disability.  Past history family history social history reviewed in the electronic medical record.     Objective:   Physical Exam WDWN in nad  Oriented x 3 and no noted deficits in memory, attention, and speech. HEENT: Normocephalic ;atraumatic , Eyes;  PERRL, EOMs  Full, lids and conjunctiva clear,,Ears: no deformities, canals nl, TM landmarks normal, Nose: no deformity or discharge  Mouth : OP clear without lesion or edema . Neck no masses Chest:  Clear to A&P without wheezes rales or rhonchi CV:  S1-S2 no gallops or murmurs peripheral perfusion is normal No clubbing cyanosis or edema Oriented x 3 and no noted deficits in memory, attention, and speech.  Lab Results  Component Value Date   WBC 7.7 06/07/2011   HGB 12.4 06/07/2011   HCT 38.6 06/07/2011   PLT 202 06/07/2011   GLUCOSE 123* 06/07/2011   CHOL  Value: 180        ATP III CLASSIFICATION:  <200     mg/dL   Desirable  200-239  mg/dL   Borderline High  >=240    mg/dL   High        10/25/2010   TRIG 180* 10/25/2010   HDL 43 10/25/2010   LDLDIRECT 139.4 06/30/2010   LDLCALC  Value: 101       * 10/25/2010   ALT 21 10/29/2010   AST 39* 10/29/2010   NA 139 06/07/2011   K 4.5 06/07/2011   CL 99 06/07/2011   CREATININE 0.92 06/07/2011   BUN 17 06/07/2011   CO2 35* 06/07/2011   TSH 2.058 10/26/2010   INR 1.04 03/18/2011   HGBA1C  Value: 6.5 (NOTE)                                                                        10/24/2010   MICROALBUR 0.2 07/09/2009         Assessment & Plan:  Cough; resolved Mild congestion coming and going poss allergy Hyperglycemia and morbid obesity   Counseled.  and motivated currently .  bg stable today Anemia better  Taking iron and b12   HT stable  Cr good. OSA treated  Total visit 45mns > 50% spent counseling and coordinating care

## 2011-07-19 ENCOUNTER — Encounter: Payer: BC Managed Care – PPO | Attending: Physical Medicine & Rehabilitation

## 2011-07-19 ENCOUNTER — Ambulatory Visit: Payer: BC Managed Care – PPO | Admitting: Physical Medicine & Rehabilitation

## 2011-07-19 DIAGNOSIS — M549 Dorsalgia, unspecified: Secondary | ICD-10-CM | POA: Insufficient documentation

## 2011-07-19 DIAGNOSIS — M79609 Pain in unspecified limb: Secondary | ICD-10-CM | POA: Insufficient documentation

## 2011-07-19 DIAGNOSIS — I69959 Hemiplegia and hemiparesis following unspecified cerebrovascular disease affecting unspecified side: Secondary | ICD-10-CM | POA: Insufficient documentation

## 2011-07-19 DIAGNOSIS — M25579 Pain in unspecified ankle and joints of unspecified foot: Secondary | ICD-10-CM

## 2011-07-19 NOTE — Assessment & Plan Note (Signed)
REASON FOR VISIT:  Evaluate left foot pain.  [name] returns today.  I last saw her on April 05, 2011.  She has a history of right frontal CVA causing left hemiparesis.  She had some spasticity while on inpatient rehab, was on baclofen for a while, but really this subsided.  We took her off baclofen and this has not recurred.  She is complaining of right medial foot pain as well as dorsum of the foot pain.  She had an osteochondral lesion apparent on the medial talus while she was on the inpatient side of things, but she reports seeing Orthopedics and says that subsequent x-rays have not demonstrated any type of lesion.  She does not have any numbness or tingling in her foot.  She does have chronic back pain, but does not feel like this pain radiates down from her back.  PHYSICAL EXAMINATION:  Morbidly obese female in no acute distress.  Mood and affect are appropriate.  Her sensation in left lower extremity is normal.  Strength is normal.  Gait shows no evidence of toe drag or knee instability.  She does have some mild tenderness to palpation at the base of the fifth metatarsal.  No erythema, no swelling in the lower extremity, no hypersensitivity to touch.  Pulses are good.  Foot is warm.  Straight leg raising test is negative.  IMPRESSION: 1. Right frontal cerebrovascular accident, left lower extremity     weakness improved. 2. Left foot pain, I really do not think this is spasticity related.     I suspect that she has some midfoot joint pain and I have     instructed her to start some Voltaren gel q.i.d. to that region. 3. If she fails this trial, then I would go ahead and look at her MRI     of the back because certainly this could be a radicular-type of     discomfort that is episodic.  She is not in any acute pain right     now.  Discussed with patient, agrees with plan.  May need epidural.     We will look at her MRI next time should she not respond favorably     to the  Voltaren gel.     [name], M.D. Electronically Signed    AEK/MedQ D:  07/19/2011 11:42:50  T:  07/19/2011 12:28:51  Job #:  [id number]  cc:   [name]. Stacy Bill, MD [place], [place] [address]  Stacy P. Leonie Man, MD Fax: [phone]

## 2011-07-26 ENCOUNTER — Ambulatory Visit (INDEPENDENT_AMBULATORY_CARE_PROVIDER_SITE_OTHER): Payer: BC Managed Care – PPO | Admitting: Internal Medicine

## 2011-07-26 DIAGNOSIS — D518 Other vitamin B12 deficiency anemias: Secondary | ICD-10-CM

## 2011-07-26 MED ORDER — CYANOCOBALAMIN 1000 MCG/ML IJ SOLN
1000.0000 ug | Freq: Once | INTRAMUSCULAR | Status: AC
  Administered 2011-07-26: 1000 ug via INTRAMUSCULAR

## 2011-08-03 ENCOUNTER — Telehealth: Payer: Self-pay | Admitting: Internal Medicine

## 2011-08-03 NOTE — Telephone Encounter (Signed)
Attempted to call pt; no answer will attempt to call at a later time.

## 2011-08-03 NOTE — Telephone Encounter (Signed)
Pt has an appt with her ENT today at 12:00. But would like to be worked into see Dr. Regis Bill she said she has chest congestion and a stuffy nose. Pt requesting you contact her

## 2011-08-03 NOTE — Telephone Encounter (Signed)
Spoke with pt and she stated the ENT gave her a z-pak and told her it should clear up her symptoms.  Pt states if she is not better she will call back in about 1 week to make an appt with PCP.

## 2011-08-16 ENCOUNTER — Ambulatory Visit (HOSPITAL_BASED_OUTPATIENT_CLINIC_OR_DEPARTMENT_OTHER): Payer: BC Managed Care – PPO | Admitting: Physical Medicine & Rehabilitation

## 2011-08-16 ENCOUNTER — Encounter: Payer: BC Managed Care – PPO | Attending: Physical Medicine & Rehabilitation

## 2011-08-16 DIAGNOSIS — M79609 Pain in unspecified limb: Secondary | ICD-10-CM | POA: Insufficient documentation

## 2011-08-16 DIAGNOSIS — I69959 Hemiplegia and hemiparesis following unspecified cerebrovascular disease affecting unspecified side: Secondary | ICD-10-CM | POA: Insufficient documentation

## 2011-08-16 DIAGNOSIS — I6340 Cerebral infarction due to embolism of unspecified cerebral artery: Secondary | ICD-10-CM

## 2011-08-16 DIAGNOSIS — M549 Dorsalgia, unspecified: Secondary | ICD-10-CM | POA: Insufficient documentation

## 2011-08-16 NOTE — Assessment & Plan Note (Signed)
REASON FOR VISIT:  Back pain and left foot pain.  [name] is a 60 year old female who I treated in the hospital for a right frontal infarct.  She had a left lower extremity greater than upper extremity paresis, but also had some foot pain in the hospital.  We checked x-rays while she was hospitalized in February.  There is an osteochondral lesion in the medial talus present.  She states she followed up with an orthopedist who took x-rays and did not find anything on followup views.  She has had MRI of the lumbar spine demonstrating some facet arthropathy, but really no compressive lesions. This is done over at [facility] and reviewed by Dr. Leonie [name].  She has had EMG/MCV of bilateral lower extremities which she states revealed some neuropathy.  She gets relief with Tylenol to a certain degree.  She thinks she had some relief with gabapentin, but not with Topamax.  She is considering lap band surgery for her morbid obesity.  PHYSICAL EXAMINATION:  Morbidly obese female, in no acute distress. Mood and affect are appropriate.  Her sensation in the left lower extremity is normal.  Strength is normal in the left lower extremity. Gait shows no evidence of toe drag or knee instability.  She has tenderness to palpation over the entire midfoot area, but not at the ankle joint, nothing at the metatarsals.  She has good foot inversion and eversion without pain.  IMPRESSION: 1. Right frontal cerebrovascular accident.  Her neurologic deficits     have improved. 2. Back pain.  She has facet arthropathy.  Pain with prolonged     standing.  She would benefit from lumbar medial branch blocks 3. Left foot pain.  I suspect this is a mid foot joint problem and     perhaps the osteochondral lesion visualized previously maybe     playing into this.  She is not looking at any type of surgical     options.  I would like to refer her to Dr. Hector Shade in regards     to her foot to see if any type of  orthotic device maybe helpful.     She would consider injections.  We discussed with patient, agrees     with plan.  I will see her back for the medial branch blocks.  She     will take some Tylenol for her pain which does help to some degree.  Please refer to health and history form for review of systems and pain scores today as well as vital signs.     Charlett Blake, M.D. Electronically Signed    AEK/MedQ D:  08/16/2011 12:20:07  T:  08/16/2011 21:44:42  Job #:  [id number]

## 2011-08-23 ENCOUNTER — Ambulatory Visit (INDEPENDENT_AMBULATORY_CARE_PROVIDER_SITE_OTHER): Payer: BC Managed Care – PPO | Admitting: Family Medicine

## 2011-08-23 DIAGNOSIS — D518 Other vitamin B12 deficiency anemias: Secondary | ICD-10-CM

## 2011-08-23 MED ORDER — CYANOCOBALAMIN 1000 MCG/ML IJ SOLN
1000.0000 ug | Freq: Once | INTRAMUSCULAR | Status: AC
  Administered 2011-08-23: 1000 ug via INTRAMUSCULAR

## 2011-09-19 ENCOUNTER — Encounter: Payer: BC Managed Care – PPO | Attending: Physical Medicine & Rehabilitation

## 2011-09-19 ENCOUNTER — Encounter (HOSPITAL_BASED_OUTPATIENT_CLINIC_OR_DEPARTMENT_OTHER): Payer: BC Managed Care – PPO | Admitting: Physical Medicine & Rehabilitation

## 2011-09-19 DIAGNOSIS — M549 Dorsalgia, unspecified: Secondary | ICD-10-CM | POA: Insufficient documentation

## 2011-09-19 DIAGNOSIS — M79609 Pain in unspecified limb: Secondary | ICD-10-CM | POA: Insufficient documentation

## 2011-09-19 DIAGNOSIS — I69959 Hemiplegia and hemiparesis following unspecified cerebrovascular disease affecting unspecified side: Secondary | ICD-10-CM | POA: Insufficient documentation

## 2011-09-19 DIAGNOSIS — M47817 Spondylosis without myelopathy or radiculopathy, lumbosacral region: Secondary | ICD-10-CM

## 2011-09-19 NOTE — Procedures (Signed)
[name], TINO NO.:  [id number]  MEDICAL RECORD NO.:  [id number]           PATIENT TYPE:  O  LOCATION:  [facility]                          FACILITY:  [facility]  PHYSICIAN:  Charlett Blake, M.D.DATE OF BIRTH:  07-01-1951  DATE OF PROCEDURE: DATE OF DISCHARGE:                              OPERATIVE REPORT  PROCEDURE:  Bilateral L5 dorsal ramus, bilateral L4 and bilateral L3 medial branch blocks under fluoroscopic guidance.  INDICATION:  Lumbar pain without radicular component.  Informed consent was obtained after describing risks and benefits of the procedure.  These include bleeding, bruising and infection.  She elects to proceed and has given written consent.  Her pain is only partially responsive to medication management and interferes with activities.  Informed consent was obtained and she elects to proceed.  The patient placed prone on fluoroscopy table.  Betadine prep, sterile drape, 25- gauge inch and half needle was used to anesthetize the skin and subcu tissue, 1% lidocaine x2 1.5 mL at each of 6 sites, followed by insertion of 22-gauge, 5 inch spinal needle, first starting at the left S1 SAP sacroilaic junction, bone contact made.  Omnipaque 180 x 0.5 mL demonstrated no intravascular uptake.  Then, 0.5 mL of solution containing 1 mL of 4 mg/mL dexamethasone and 2 mL of 2% MPF lidocaine was injected.  Then, the left L5 SAP transverse process junction targeted, bone contact made, confirmed with lateral imaging.  Omnipaque 180 x 0.5 mL demonstrated no intravascular uptake.  Then, 0.5 mL of the dexamethasone lidocaine solution was injected.  Then, the left L4 SAP transverse process junction targeted, bone contact made.  Omnipaque 180 x 0.5 mL demonstrated no intravascular uptake.  Then, 0.5 mL of the dexamethasone lidocaine solution was injected.  The same procedure was repeated on the right side using same needle injectate and technique. The patient  tolerated the procedure well.  Postprocedure instructions given.     Charlett Blake, M.D. Electronically Signed    AEK/MEDQ  D:  09/19/2011 13:33:22  T:  09/19/2011 19:53:41  Job:  [id number]  cc:   Standley Brooking. Regis Bill, MD [address] [place], [place] [address]

## 2011-09-20 ENCOUNTER — Other Ambulatory Visit (INDEPENDENT_AMBULATORY_CARE_PROVIDER_SITE_OTHER): Payer: BC Managed Care – PPO

## 2011-09-20 DIAGNOSIS — D518 Other vitamin B12 deficiency anemias: Secondary | ICD-10-CM

## 2011-09-20 LAB — VITAMIN B12: Vitamin B-12: 430 pg/mL (ref 211–911)

## 2011-09-20 LAB — HEMOGLOBIN A1C: Hgb A1c MFr Bld: 6.5 % (ref 4.6–6.5)

## 2011-09-21 ENCOUNTER — Ambulatory Visit (INDEPENDENT_AMBULATORY_CARE_PROVIDER_SITE_OTHER): Payer: BC Managed Care – PPO | Admitting: Sports Medicine

## 2011-09-21 ENCOUNTER — Encounter: Payer: Self-pay | Admitting: Sports Medicine

## 2011-09-21 VITALS — BP 123/86 | HR 77 | Ht 62.0 in | Wt 300.0 lb

## 2011-09-21 DIAGNOSIS — M79672 Pain in left foot: Secondary | ICD-10-CM

## 2011-09-21 DIAGNOSIS — M79609 Pain in unspecified limb: Secondary | ICD-10-CM

## 2011-09-21 DIAGNOSIS — G5730 Lesion of lateral popliteal nerve, unspecified lower limb: Secondary | ICD-10-CM | POA: Insufficient documentation

## 2011-09-21 MED ORDER — AMITRIPTYLINE HCL 25 MG PO TABS: 25.0000 mg | ORAL_TABLET | Freq: Every day | ORAL | Status: DC

## 2011-09-21 NOTE — Progress Notes (Signed)
  Subjective:    Patient ID: [name], female    DOB: Oct 05, 1950, 61 y.o.   MRN: [id number]  HPI  Ms. Dobosz presents to our clinic complaining of pain on the dorsal aspect of her left foot.  She says that lat February, she had a stroke that affected her left leg.  At that time, she had severe, intense muscle spasms in her left leg and foot that were extremely painful.  She has had significant improvement since her stroke and is now able to ambulate.    However, she still has pain in her foot.  She says it starts on her dorsal arch and goes to her great toe.  She says it can happen at any time of day, during any activity.  She denies claudication symptoms.  She has seen the orthopedist who took x rays that were normal, and seen neurology, who did an emg that showed very mild neuropathy.    Ms. Naff has had bilateral knee replacements, with two revisions on the left knee (total three TKR on left knee).  She has some residual numbness on the lateral knee and shin on the left side, but says she has good sensation on the right side.   Review of Systems Pertinent items noted in HPI.     Objective:   Physical Exam BP 123/86  Pulse 77  Ht _0  (1.575 m)  Wt 300 lb (136.079 kg)  BMI 54.87 kg/m2 General appearance: alert, cooperative, no distress and morbidly obese MSK: patient has bilateral TKR scars on knees.   Standing posture shows internal rotation and pronation of left foot She has tenderness to palpation over left dorsal foot and great toe There is no obvious deformity or swelling of left foot compared to right 2+ dorsalis pedis pulses bilaterally.  Normal sensation in feet both on dorsal and plantar aspects bilaterally.       Assessment & Plan:

## 2011-09-21 NOTE — Assessment & Plan Note (Signed)
Pt has tried gabapentin and lyrica since this pain started, neither of which helped.   Will start amitriptyline at bedtime.   Have also given scaphoid pad in left shoe to help correct internal rotation of foot, which likely also irritates peroneal nerve.   Pt to follow up in 1 month.

## 2011-09-21 NOTE — Assessment & Plan Note (Addendum)
Symptoms and history suggestive of irritation of left peroneal nerve.  Her three knee surgeries and her stoke that affected her left leg likely both contributing to nerve irritation.    We will give trial of amitriptyline  Add arch pads to take out some degree of left foot pronation  I do not think the pain is intrinsic to the foot  Recheck after trial of this for 1 month

## 2011-09-21 NOTE — Patient Instructions (Signed)
It was nice to meet you.  Dr. Oneida Alar believes your foot pain is from irritation of your peroneal nerve.  This nerve runs down the back of your leg and comes around to the front of your leg at your knee, and goes down to the top of your foot and your toe.    Please try wearing shoes with arch insert in your left shoe to stop your foot from rotating in, this will take some pressure off of the nerve.  We also want you to start amitriptyline, one pill at bedtime, to help calm down the irritation in this nerve.  It can make you sleepy, so we recommend taking it at bedtime.  If you feel too drowsy, or dizzy or lightheaded, please stop the medication and call our office.

## 2011-09-27 ENCOUNTER — Encounter: Payer: Self-pay | Admitting: Internal Medicine

## 2011-09-27 ENCOUNTER — Ambulatory Visit (INDEPENDENT_AMBULATORY_CARE_PROVIDER_SITE_OTHER): Payer: BC Managed Care – PPO | Admitting: Internal Medicine

## 2011-09-27 VITALS — BP 130/80 | HR 60 | Wt 332.0 lb

## 2011-09-27 DIAGNOSIS — Z23 Encounter for immunization: Secondary | ICD-10-CM

## 2011-09-27 DIAGNOSIS — R7303 Prediabetes: Secondary | ICD-10-CM

## 2011-09-27 DIAGNOSIS — I10 Essential (primary) hypertension: Secondary | ICD-10-CM

## 2011-09-27 DIAGNOSIS — R7309 Other abnormal glucose: Secondary | ICD-10-CM

## 2011-09-27 DIAGNOSIS — Z8673 Personal history of transient ischemic attack (TIA), and cerebral infarction without residual deficits: Secondary | ICD-10-CM

## 2011-09-27 DIAGNOSIS — Z7901 Long term (current) use of anticoagulants: Secondary | ICD-10-CM

## 2011-09-27 DIAGNOSIS — M79672 Pain in left foot: Secondary | ICD-10-CM

## 2011-09-27 DIAGNOSIS — D518 Other vitamin B12 deficiency anemias: Secondary | ICD-10-CM

## 2011-09-27 DIAGNOSIS — M79609 Pain in unspecified limb: Secondary | ICD-10-CM

## 2011-09-27 DIAGNOSIS — G4733 Obstructive sleep apnea (adult) (pediatric): Secondary | ICD-10-CM

## 2011-09-27 MED ORDER — CYANOCOBALAMIN 1000 MCG/ML IJ SOLN
1000.0000 ug | Freq: Once | INTRAMUSCULAR | Status: AC
  Administered 2011-09-27: 1000 ug via INTRAMUSCULAR

## 2011-09-27 NOTE — Patient Instructions (Signed)
Every 4 weeks   b12 shots  At this time.  Continue lifestyle intervention healthy eating and exercise . To keep your blood sugar  From rising. OV in 6 months or as needed

## 2011-09-27 NOTE — Progress Notes (Signed)
Subjective:    Patient ID: MAVEN ROSANDER, female    DOB: 07-10-1951, 61 y.o.   MRN: [id number]  HPI  Patient comes in today for follow up of  multiple medical problems.   Since last visit :  Left foot pain   Had  arch support and shots in back and  tca    And jections in the meantime.  Saw Dr Erick Alley how foot  Mechanic  Change can help   Sleep and breathing are stable now  Ht stable  Hyperglycemia   No new sx  Vision changes  awar of need for lsi   Anemia and b12 deficiency getting  q 4 weeks  Injections   Had labs done recently   Review of Systems No falling fever new gi gu sx bleeding bruising  .   Past history family history social history reviewed in the electronic medical record. Outpatient Encounter Prescriptions as of 09/27/2011  Medication Sig Dispense Refill  . amitriptyline (ELAVIL) 25 MG tablet Take 1 tablet (25 mg total) by mouth at bedtime.  30 tablet  3  . atenolol (TENORMIN) 50 MG tablet Take 1 in am and 1/2 at night  135 tablet  3  . diclofenac sodium (VOLTAREN) 1 % GEL Apply topically.        . DULoxetine (CYMBALTA) 30 MG capsule Take 30 mg by mouth daily.        . fish oil-omega-3 fatty acids 1000 MG capsule Take 2 g by mouth daily.        Marland Kitchen levothyroxine (SYNTHROID, LEVOTHROID) 75 MCG tablet Take 1 tablet (75 mcg total) by mouth daily.  90 tablet  3  . lisinopril-hydrochlorothiazide (PRINZIDE,ZESTORETIC) 20-25 MG per tablet Take 1 tablet by mouth daily.  90 tablet  3  . Rivaroxaban (XARELTO PO) Take by mouth.        . simvastatin (ZOCOR) 20 MG tablet Take 20 mg by mouth at bedtime.        . ergocalciferol (VITAMIN D2) 50000 UNITS capsule Take 50,000 Units by mouth once a week.        Marland Kitchen DISCONTD: TOPAMAX 50 MG tablet Take 50 mg by mouth daily.        Facility-Administered Encounter Medications as of 09/27/2011  Medication Dose Route Frequency Provider Last Rate Last Dose  . cyanocobalamin ((VITAMIN B-12)) injection 1,000 mcg  1,000 mcg Intramuscular  Once [name], MD      . cyanocobalamin ((VITAMIN B-12)) injection 1,000 mcg  1,000 mcg Intramuscular Once [name], MD   1,000 mcg at 09/27/11 1435       Objective:   Physical Exam WDWN in nad  Speech normal  Gait independent   Favors left foot Voice less hoarse  Chest:  Clear to A&P without wheezes rales or rhonchi CV:  S1-S2 no gallops or murmurs peripheral perfusion is normal    Lab Results  Component Value Date   HGBA1C 6.5 09/20/2011   Lab Results  Component Value Date   VITAMINB12 430 09/20/2011       Assessment & Plan:  B12 deficiency   Continue q 4 weeks  On lower limit  of acceptable   HCM  Disc zostavax   Foot pain:  Sp eval poss related to back dysfunctions    Has seen Dr Oneida Alar   Blood sugar.  Elevation is borderline diabetic. By a1c . Disc and counseling  Morbid obesity  Disc importance of weigh loss.  OSA:  To see Dr Ernesto Rutherford ent week.   S/P CVA recovering   To see Neurology in few months .No new sx .  Anticoagulation  rivaroxaban  for AF  Total visit 26mns > 50% spent counseling and coordinating care

## 2011-10-02 DIAGNOSIS — R7303 Prediabetes: Secondary | ICD-10-CM | POA: Insufficient documentation

## 2011-10-02 NOTE — Assessment & Plan Note (Signed)
No bleeding or falling.

## 2011-10-13 ENCOUNTER — Ambulatory Visit (INDEPENDENT_AMBULATORY_CARE_PROVIDER_SITE_OTHER): Payer: BC Managed Care – PPO | Admitting: Sports Medicine

## 2011-10-13 VITALS — BP 128/76

## 2011-10-13 DIAGNOSIS — M84376A Stress fracture, unspecified foot, initial encounter for fracture: Secondary | ICD-10-CM

## 2011-10-13 DIAGNOSIS — M79673 Pain in unspecified foot: Secondary | ICD-10-CM | POA: Insufficient documentation

## 2011-10-13 DIAGNOSIS — M79609 Pain in unspecified limb: Secondary | ICD-10-CM

## 2011-10-13 NOTE — Assessment & Plan Note (Addendum)
Left foot not painful in past  with her acute pain I suggested elevation and icing She was to continue on Tylenol for pain and that should be okay  Arch strap Post op shoee for support

## 2011-10-13 NOTE — Assessment & Plan Note (Signed)
Since ultrasound is more sensitive for metatarsal stress fractures I think this is likely a real fracture probably triggered by walking without enough support and her morbid obesity  She will limit walking and use the support shoe and strap as above  I want to recheck this in 2 weeks and repeat her scan. If there is any question at that time we will go ahead with x-ray.  Dr.Panosh has checked her bone density in the past. I think a uric acid level would also be helpful to be sure that some of the changes around the joint are not related to gout.  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

## 2011-10-13 NOTE — Progress Notes (Signed)
  Subjective:    Patient ID: [name], female    DOB: [birth year], 61 y.o.   MRN: [id number]  HPI  Pt presents to clinic for evaluation of rt foot pain and swelling that started 10/08/11. Denies wearing different shoes, or an injury.   Hx of rt knee replacement 2006, lt knee replacement done 3 times last surgery was 2011.  She is now 2 weeks into treatment for a left foot peroneal neuropathy. We placed an arch bed and also started her on amitriptyline at nighttime. The symptoms are significantly improved.  However at this knee pain came on after she traveled to see her son. She did some walking that day but does not remember a specific injury. Now this is tender to touch, swollen, red and very painful to walk.     Review of Systems     Objective:   Physical Exam Obese female in some discomfort antalgic gait favoring the right foot when walking  Genu Valgus change L > R  Rt foot: TTP 4th and 5th  Swollen to MT 3-5 on outside- warm and red Flexion and extension of toes without problems Maximal point of tenderness is over the proximal shaft of the fourth metatarsal on the dorsal side of the foot  Note that the left foot is not painful to palpation or movement today. There is no evidence of swelling on the left.  MSK ultrasound right foot  There is marked soft tissue swelling over the dorsum of the foot with hypoechoic change throughout the soft tissue There is a small spur on the base of the fifth metatarsal with some slight hypoechoic change, normal Doppler activity and no tenderness to palpation at the point of maximal change On the fourth metatarsal there is a bony disruption, marked hypoechoic change and this is noted on the longitudinal and transverse scans. There is only a slight increase in Doppler activity. Third metatarsal looks normal      Assessment & Plan:

## 2011-10-13 NOTE — Patient Instructions (Signed)
Take calcium 1500 mg daily   Use arch strap, but ok to take it off if it gets uncomfortable  Ice a few times per day  Wear post op shoe when you are on your feet  Elevate your foot when you are resting   Please follow up in 2 weeks for rescan  Thank you for seeing [place] today!

## 2011-10-24 ENCOUNTER — Ambulatory Visit (INDEPENDENT_AMBULATORY_CARE_PROVIDER_SITE_OTHER): Payer: BC Managed Care – PPO | Admitting: Sports Medicine

## 2011-10-24 VITALS — BP 112/74

## 2011-10-24 DIAGNOSIS — M84376A Stress fracture, unspecified foot, initial encounter for fracture: Secondary | ICD-10-CM

## 2011-10-24 DIAGNOSIS — M79673 Pain in unspecified foot: Secondary | ICD-10-CM

## 2011-10-24 DIAGNOSIS — M79609 Pain in unspecified limb: Secondary | ICD-10-CM

## 2011-10-24 NOTE — Patient Instructions (Signed)
1. Continue wearing your arch strap and hard sole shoe daily.  2. Follow up with [place] in 3 weeks.

## 2011-10-25 ENCOUNTER — Encounter: Payer: BC Managed Care – PPO | Attending: Physical Medicine & Rehabilitation

## 2011-10-25 ENCOUNTER — Ambulatory Visit: Payer: BC Managed Care – PPO | Admitting: Physical Medicine & Rehabilitation

## 2011-10-25 ENCOUNTER — Ambulatory Visit (INDEPENDENT_AMBULATORY_CARE_PROVIDER_SITE_OTHER): Payer: BC Managed Care – PPO | Admitting: Internal Medicine

## 2011-10-25 DIAGNOSIS — I69959 Hemiplegia and hemiparesis following unspecified cerebrovascular disease affecting unspecified side: Secondary | ICD-10-CM | POA: Insufficient documentation

## 2011-10-25 DIAGNOSIS — M79609 Pain in unspecified limb: Secondary | ICD-10-CM | POA: Insufficient documentation

## 2011-10-25 DIAGNOSIS — D518 Other vitamin B12 deficiency anemias: Secondary | ICD-10-CM

## 2011-10-25 DIAGNOSIS — M549 Dorsalgia, unspecified: Secondary | ICD-10-CM | POA: Insufficient documentation

## 2011-10-25 MED ORDER — CYANOCOBALAMIN 1000 MCG/ML IJ SOLN
1000.0000 ug | Freq: Once | INTRAMUSCULAR | Status: AC
  Administered 2011-10-25: 1000 ug via INTRAMUSCULAR

## 2011-10-26 NOTE — Assessment & Plan Note (Signed)
The diagnosis is confirmed to be a stress fracture so will continue her hard shoe for at least 6 weeks.  We will follow the fracture for healing with ultrasound.

## 2011-10-26 NOTE — Progress Notes (Signed)
  Subjective:    Patient ID: [name], female    DOB: [birth year], 61 y.o.   MRN: [id number]  HPI 61 y/o female about 10 days s/p a 4th metatarsal stress fracture is here for follow up. Her pain and swelling are much improved.  She has been wearing a hard sole shoe and walking with a cane.  On initial visit she could barely walk secondary to pain and extensive swelling   Review of Systems     Objective:   Physical Exam  Right foot: Mild swelling, no erythema or deformity Tender to palpation on the proximal 4th metatarsal No tenderness elsewhere on the foot Normal motion of the toes and foot  Ultrasound: Improvement in the area of edema around the 4th metatarsal.  There is markedly less fluid in this area.  The previously seen area of concern is confirmed as a fracture of the proximal fourth metatarsal.  In the long and transverse view.         Assessment & Plan:

## 2011-10-27 NOTE — Assessment & Plan Note (Signed)
Stress fracture for her is complicated by her morbid obesity, her neuropathy, and her other chronic orthopedic issues.  While she is making good progress I suggested this may take 6 weeks or even longer to heal and we will use solid foot support as long as her pain level is more than mild.

## 2011-10-28 ENCOUNTER — Encounter: Payer: Self-pay | Admitting: Physical Medicine & Rehabilitation

## 2011-10-28 ENCOUNTER — Ambulatory Visit (HOSPITAL_BASED_OUTPATIENT_CLINIC_OR_DEPARTMENT_OTHER): Payer: BC Managed Care – PPO | Admitting: Physical Medicine & Rehabilitation

## 2011-10-28 VITALS — BP 139/89 | HR 95 | Resp 20 | Ht 62.0 in | Wt 300.0 lb

## 2011-10-28 DIAGNOSIS — M545 Low back pain, unspecified: Secondary | ICD-10-CM

## 2011-10-28 DIAGNOSIS — M79609 Pain in unspecified limb: Secondary | ICD-10-CM

## 2011-10-28 DIAGNOSIS — M79671 Pain in right foot: Secondary | ICD-10-CM

## 2011-10-28 NOTE — Progress Notes (Signed)
  Subjective:    Patient ID: [name], female    DOB: [birth year], 61 y.o.   MRN: [id number]  HPI Right metatarsal fracture #4 while stepping up on the porch approximately 2 weeks ago. Seen by sports medicine and placed in a cast shoe. Diagnosed with peroneal neuropathy left foot and started on  Elavil, placed in a  Right foot orthosis for metatarsal fx . She has some relief of her back pain and improve sleep on the Elavil. She has been seen by neurology in the past and course having had I nerve conduction which showed some type of neuropathy however I do not have the report.  Past surgical history significant for right shoulder hemiarthroplasty Past history significant for diabetes, atrial fibrillation on chronic anticoagulation  Pain Inventory Average Pain 2   pain can get up to at 8 in the back with activity Pain Right Now 1 My pain is dull  In the last 24 hours, has pain interfered with the following? General activity 1 Relation with others 2 What TIME of day is your pain at its worst? morning Sleep (in general) Good  Pain is worse with: walking, bending, sitting and standing Pain improves with: rest Relief from Meds: 2  Mobility use a cane  Function disabled: date disabled 10/24/10   Prior Studies Any changes since last visit?  no  Physicians involved in your care Any changes since last visit?  no      Review of Systems  All other systems reviewed and are negative.      Cardio/Respiratory:  coughing, shortness of breath and sleep apnea/CPAP Objective:   Physical Exam  Constitutional: She is oriented to person, place, and time. She appears well-developed.       Morbidly obese  HENT:  Head: Atraumatic.  Eyes: EOM are normal. Pupils are equal, round, and reactive to light.  Neck: Normal range of motion.  Musculoskeletal: She exhibits edema.       Bilateral ankle edema. Right foot in a cast shoe  Neurological: She is alert and oriented to person, place,  and time. She has normal strength. No sensory deficit. Gait abnormal.       No sensory deficit on the left foot Difficult to test DTRs secondary to pain in the left knee          Assessment & Plan:  1. 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 She has not lost any function since that time and is being treated by sports medicine for the metatarsal fracture. 2. Back pain does not appear to be secondary to lumbar spondylosis since she did not respond well to the medial branch blocks. She may have sacroiliac disorder versus lumbar degenerative disc. Should she want another spinal injection I would recommend a sacroiliac injection. She will think about this and call me back when she decides.  The patient is to followup with sports medicine for her right foot Patient is followup with Dr. Leonie Man from neurology I will see her back on a when necessary basis.

## 2011-10-28 NOTE — Patient Instructions (Signed)
Obesity Obesity is defined as having a body mass index (BMI) of 30 or more. To calculate your BMI divide your weight in pounds by your height in inches squared and multiply that product by 703. Major illnesses resulting from long-term obesity include:  Stroke.   Heart disease.   Diabetes.   Many cancers.   Arthritis.  Obesity also complicates recovery from many other medical problems.  CAUSES   A history of obesity in your parents.   Thyroid hormone imbalance.   Environmental factors such as excess calorie intake and physical inactivity.  TREATMENT  A healthy weight loss program includes:  A calorie restricted diet based on individual calorie needs.   Increased physical activity (exercise).  An exercise program is just as important as the right low-calorie diet.  Weight-loss medicines should be used only under the supervision of your physician. These medicines help, but only if they are used with diet and exercise programs. Medicines can have side effects including nervousness, nausea, abdominal pain, diarrhea, headache, drowsiness, and depression.  An unhealthy weight loss program includes:  Fasting.   Fad diets.   Supplements and drugs.  These choices do not succeed in long-term weight control.  HOME CARE INSTRUCTIONS  To help you make the needed dietary changes:   Exercise and perform physical activity as directed by your caregiver.   Keep a daily record of everything you eat. There are many free websites to help you with this. It may be helpful to measure your foods so you can determine if you are eating the correct portion sizes.   Use low-calorie cookbooks or take special cooking classes.   Avoid alcohol. Drink more water and drinks with no calories.   Take vitamins and supplements only as recommended by your caregiver.   Weight loss support groups, Registered Dieticians, counselors, and stress reduction education can also be very helpful.  Document Released:  09/29/2004 Document Revised: 05/04/2011 Document Reviewed: 07/29/2007 [facility] Patient Information 2012 [place].

## 2011-10-31 ENCOUNTER — Ambulatory Visit: Payer: BC Managed Care – PPO | Admitting: Physical Medicine & Rehabilitation

## 2011-11-14 ENCOUNTER — Ambulatory Visit (INDEPENDENT_AMBULATORY_CARE_PROVIDER_SITE_OTHER): Payer: BC Managed Care – PPO | Admitting: Sports Medicine

## 2011-11-14 VITALS — BP 119/66

## 2011-11-14 DIAGNOSIS — M79609 Pain in unspecified limb: Secondary | ICD-10-CM

## 2011-11-14 DIAGNOSIS — M84376A Stress fracture, unspecified foot, initial encounter for fracture: Secondary | ICD-10-CM

## 2011-11-14 DIAGNOSIS — M79672 Pain in left foot: Secondary | ICD-10-CM

## 2011-11-14 NOTE — Patient Instructions (Addendum)
Continue using arch strap for the next 6 weeks on the rt  Try weaning out of post op shoe over the next 2 weeks- wearing post op shoe when regular shoe is uncomfortable  We will call you when the ankle braces come in  Please follow up in 3 months  Thank you for seeing [place] today!

## 2011-11-14 NOTE — Assessment & Plan Note (Signed)
Chronic changes I think will be helped by continued arch support in all shoes  I would like to add an ankle compression sleeve as I think her weight and previous surgeries to knee make the left more likely to swell  Swelling seems to increase her pain  We will try to find right type of ankle compression and contact her to try this  Once in place reck 3 mos after that

## 2011-11-14 NOTE — Assessment & Plan Note (Signed)
Rt foot today is essentially healed  There is remodleling  Thicker cortex suggesting bony callus seen over distal 4th MT  FX line is no longer seen and no swelling or abnormal doppler  Gradually move back into shoe Use arch strap at least 6 more weeks

## 2011-11-14 NOTE — Progress Notes (Signed)
  Subjective:    Patient ID: [name], female    DOB: 1951-08-17, 61 y.o.   MRN: [id number]  HPI  Pt presents to clinic for f/u lt foot and ankle pain and rt 4th MT stress fx. No pain now over lt peroneal tendons.  Has pain over medial foot and ankle, and some swelling over ankle.   Rt foot has minimal pain.  Continues wearing post op shoe and arch strap on rt.  Feels she can walk enough to do ADLs now  Left has improved with arch pads but still pain around ankle        Review of Systems     Objective:   Physical Exam morbidly obese  Lt foot Peroneal tendons on lt not tender or swollen - this is a change ttp over tmt joint and med malleolus Significant varicosity above and around medial joint line Generalized non pitting edem lt lower leg  Rt foot  4th MT feels like has callus over it Forefoot swelling has resolved Now no pain to deep palpation or to squeeze of MTs  MSK [place] Rt 4th MT looks very different No hypoechoic change now Distal MT shows thickening on long and transverse views This looks like hard callus that is remodeling No abnormal doppler flow now     Assessment & Plan:

## 2011-11-18 ENCOUNTER — Ambulatory Visit (INDEPENDENT_AMBULATORY_CARE_PROVIDER_SITE_OTHER): Payer: BC Managed Care – PPO | Admitting: Internal Medicine

## 2011-11-18 ENCOUNTER — Encounter: Payer: Self-pay | Admitting: Internal Medicine

## 2011-11-18 VITALS — BP 120/80 | HR 60 | Temp 98.5°F

## 2011-11-18 DIAGNOSIS — I4891 Unspecified atrial fibrillation: Secondary | ICD-10-CM

## 2011-11-18 DIAGNOSIS — D518 Other vitamin B12 deficiency anemias: Secondary | ICD-10-CM

## 2011-11-18 DIAGNOSIS — I10 Essential (primary) hypertension: Secondary | ICD-10-CM

## 2011-11-18 DIAGNOSIS — Q782 Osteopetrosis: Secondary | ICD-10-CM

## 2011-11-18 DIAGNOSIS — Z7901 Long term (current) use of anticoagulants: Secondary | ICD-10-CM

## 2011-11-18 DIAGNOSIS — Z8673 Personal history of transient ischemic attack (TIA), and cerebral infarction without residual deficits: Secondary | ICD-10-CM

## 2011-11-18 DIAGNOSIS — Z9181 History of falling: Secondary | ICD-10-CM

## 2011-11-18 DIAGNOSIS — Z9889 Other specified postprocedural states: Secondary | ICD-10-CM

## 2011-11-18 DIAGNOSIS — R7309 Other abnormal glucose: Secondary | ICD-10-CM

## 2011-11-18 DIAGNOSIS — M84376A Stress fracture, unspecified foot, initial encounter for fracture: Secondary | ICD-10-CM

## 2011-11-18 DIAGNOSIS — G5730 Lesion of lateral popliteal nerve, unspecified lower limb: Secondary | ICD-10-CM

## 2011-11-18 DIAGNOSIS — Z79899 Other long term (current) drug therapy: Secondary | ICD-10-CM

## 2011-11-18 MED ORDER — CYANOCOBALAMIN 1000 MCG/ML IJ SOLN
1000.0000 ug | Freq: Once | INTRAMUSCULAR | Status: AC
  Administered 2011-11-18: 1000 ug via INTRAMUSCULAR

## 2011-11-18 NOTE — Patient Instructions (Signed)
Injection verified SN

## 2011-11-18 NOTE — Progress Notes (Signed)
Subjective:    Patient ID: [name], female    DOB: April 09, 1951, 61 y.o.   MRN: [id number]  HPI Patient comes in today for follow up of  multiple medical problems.   But most specifically because she has a form for disability services. She has been denied twice by [employer] and will now be having a face-to-face interview. There is a form to be filled out about assessment or opinion of limitations.  She felt I would be the best to fill this out. Her health care team includes  Cardiology Dr Gwenlyn Found and rehab Dr Letta Pate , Dr Oneida Alar and her Gyne Dr [name] .( And Neuro in the past .) Update of her symptoms and status:   Resp sob when walking down the hall but no significant change  OSA: Sleep better .  Rehabilitation physician has released her   as I suppose maximal improvement.  She sees cardiology for her A. Fib on xarelto no bleeding  PCP manage her hypertension and thyroid.  vitamin B12 which has been stable  GYN gives her Cymbalta for her pain and mood. These things are stable.  She has a history of surgery x3 on her left knee which is problematic and neuropathy left peroneal on her left foot. She walks with a cane most times to avoid falling but has fallen 4 times since her stroke. The last time on her front step and needed to have someone help her get up. She has a fracture that is healing on her right fourth metatarsal and is doing better with this. At this time she is able to drive . However has limited time to sitting and standing because of her back pain and positional problems. She is left-handed but is able to write much better but not as good with gripping and opening jars.  She does get help with her housework and her shopping and calls in for on  line shopping but can do quick trips to the store.  She denies any cognitive changes in her speech is good.  She avoids stooping to avoid falling. Vision and hearing appear stable. Review of Systems Sleep and sleep apnea  is better. No current cp bleeding fever ha vision changes rest as per hpi Past history family history social history reviewed in the electronic medical record.     Outpatient Encounter Prescriptions as of 11/18/2011  Medication Sig Dispense Refill  . amitriptyline (ELAVIL) 25 MG tablet Take 1 tablet (25 mg total) by mouth at bedtime.  30 tablet  3  . atenolol (TENORMIN) 50 MG tablet Take 1 in am and 1/2 at night  135 tablet  3  . diclofenac sodium (VOLTAREN) 1 % GEL Apply topically.        . DULoxetine (CYMBALTA) 30 MG capsule Take 30 mg by mouth daily.        . ergocalciferol (VITAMIN D2) 50000 UNITS capsule Take 50,000 Units by mouth once a week.        . fish oil-omega-3 fatty acids 1000 MG capsule Take 2 g by mouth daily.        Marland Kitchen levothyroxine (SYNTHROID, LEVOTHROID) 75 MCG tablet Take 1 tablet (75 mcg total) by mouth daily.  90 tablet  3  . lisinopril-hydrochlorothiazide (PRINZIDE,ZESTORETIC) 20-25 MG per tablet Take 1 tablet by mouth daily.  90 tablet  3  . Rivaroxaban (XARELTO PO) Take by mouth.        . simvastatin (ZOCOR) 20 MG tablet Take 20 mg by mouth  at bedtime.        Marland Kitchen DISCONTD: TOPAMAX 50 MG tablet Take 50 mg by mouth.       Facility-Administered Encounter Medications as of 11/18/2011  Medication Dose Route Frequency Provider Last Rate Last Dose  . cyanocobalamin ((VITAMIN B-12)) injection 1,000 mcg  1,000 mcg Intramuscular Once [name], MD      . cyanocobalamin ((VITAMIN B-12)) injection 1,000 mcg  1,000 mcg Intramuscular Once [name], MD   1,000 mcg at 11/18/11 1040    Objective:   Physical Exam Pleasant alert female in no acute distress walks with cane gingerly Oriented x 3 and no noted deficits in memory, attention, and speech. Good rom UE   Data reviewed  Notes in EHR available.     Assessment & Plan:  Peroneal neuropathy left leg +3 knee surgeries and status post CVA on the left. UE recovered mostly   Left UE . function is much better but still has  some grip issues.  Right lower extremity foot status post fracture. Healing  Balance issues are important she has fallen off 4 times the last number of months the last time on the step getting into the house She is taking care and trying to avoid stooping.  Cognitively intact.  Back  problematic if sitting  standing for long periods of time  She has been able to drive since her right foot is getting better on a automatic transmission.  She uses friends and family to help with prolonged shopping bathroom cleaning house work chores.   Will complete  form for social security evaluation and send it in. As requested .

## 2011-11-19 ENCOUNTER — Encounter: Payer: Self-pay | Admitting: Internal Medicine

## 2011-11-19 DIAGNOSIS — Z79899 Other long term (current) drug therapy: Secondary | ICD-10-CM | POA: Insufficient documentation

## 2011-11-19 DIAGNOSIS — Z9889 Other specified postprocedural states: Secondary | ICD-10-CM | POA: Insufficient documentation

## 2011-11-19 DIAGNOSIS — Z9181 History of falling: Secondary | ICD-10-CM | POA: Insufficient documentation

## 2011-11-22 ENCOUNTER — Ambulatory Visit: Payer: BC Managed Care – PPO | Admitting: *Deleted

## 2011-12-20 ENCOUNTER — Ambulatory Visit (INDEPENDENT_AMBULATORY_CARE_PROVIDER_SITE_OTHER): Payer: BC Managed Care – PPO

## 2011-12-20 ENCOUNTER — Ambulatory Visit: Payer: BC Managed Care – PPO | Admitting: *Deleted

## 2011-12-20 DIAGNOSIS — D518 Other vitamin B12 deficiency anemias: Secondary | ICD-10-CM

## 2011-12-20 MED ORDER — CYANOCOBALAMIN 1000 MCG/ML IJ SOLN
1000.0000 ug | Freq: Once | INTRAMUSCULAR | Status: AC
  Administered 2011-12-20: 1000 ug via INTRAMUSCULAR

## 2012-01-17 ENCOUNTER — Other Ambulatory Visit: Payer: Self-pay | Admitting: Sports Medicine

## 2012-01-17 ENCOUNTER — Ambulatory Visit (INDEPENDENT_AMBULATORY_CARE_PROVIDER_SITE_OTHER): Payer: BC Managed Care – PPO

## 2012-01-17 ENCOUNTER — Ambulatory Visit: Payer: BC Managed Care – PPO | Admitting: *Deleted

## 2012-01-17 DIAGNOSIS — D518 Other vitamin B12 deficiency anemias: Secondary | ICD-10-CM

## 2012-01-17 MED ORDER — CYANOCOBALAMIN 1000 MCG/ML IJ SOLN
1000.0000 ug | Freq: Once | INTRAMUSCULAR | Status: AC
  Administered 2012-01-17: 1000 ug via INTRAMUSCULAR

## 2012-02-14 ENCOUNTER — Ambulatory Visit: Payer: BC Managed Care – PPO | Admitting: *Deleted

## 2012-02-14 ENCOUNTER — Ambulatory Visit (INDEPENDENT_AMBULATORY_CARE_PROVIDER_SITE_OTHER): Payer: BC Managed Care – PPO

## 2012-02-14 DIAGNOSIS — E538 Deficiency of other specified B group vitamins: Secondary | ICD-10-CM

## 2012-02-14 MED ORDER — CYANOCOBALAMIN 1000 MCG/ML IJ SOLN
1000.0000 ug | Freq: Once | INTRAMUSCULAR | Status: AC
  Administered 2012-02-14: 1000 ug via INTRAMUSCULAR

## 2012-02-17 ENCOUNTER — Ambulatory Visit (INDEPENDENT_AMBULATORY_CARE_PROVIDER_SITE_OTHER): Payer: BC Managed Care – PPO | Admitting: Internal Medicine

## 2012-02-17 ENCOUNTER — Encounter: Payer: Self-pay | Admitting: Internal Medicine

## 2012-02-17 VITALS — BP 130/78 | HR 68 | Temp 98.2°F

## 2012-02-17 DIAGNOSIS — I10 Essential (primary) hypertension: Secondary | ICD-10-CM

## 2012-02-17 DIAGNOSIS — Z79899 Other long term (current) drug therapy: Secondary | ICD-10-CM

## 2012-02-17 DIAGNOSIS — R0602 Shortness of breath: Secondary | ICD-10-CM | POA: Insufficient documentation

## 2012-02-17 DIAGNOSIS — J069 Acute upper respiratory infection, unspecified: Secondary | ICD-10-CM | POA: Insufficient documentation

## 2012-02-17 DIAGNOSIS — G4733 Obstructive sleep apnea (adult) (pediatric): Secondary | ICD-10-CM

## 2012-02-17 DIAGNOSIS — Z7901 Long term (current) use of anticoagulants: Secondary | ICD-10-CM

## 2012-02-17 MED ORDER — DOXYCYCLINE HYCLATE 100 MG PO CAPS: 100.0000 mg | ORAL_CAPSULE | Freq: Two times a day (BID) | ORAL | Status: AC

## 2012-02-17 MED ORDER — ALBUTEROL SULFATE 1.25 MG/3ML IN NEBU: 1.0000 | INHALATION_SOLUTION | Freq: Four times a day (QID) | RESPIRATORY_TRACT | Status: DC | PRN

## 2012-02-17 MED ORDER — ALBUTEROL SULFATE 1.25 MG/3ML IN NEBU: 1.2500 mg | INHALATION_SOLUTION | Freq: Once | RESPIRATORY_TRACT | Status: DC

## 2012-02-17 NOTE — Patient Instructions (Signed)
This could be a viral respiratory infection or even an allergic cough. Try Zyrtec over the counter and if this is not helpful and she would have infected-looking drainage he may begin the antibiotic.  At this time I do not see evidence of pneumonia.

## 2012-02-17 NOTE — Progress Notes (Signed)
Subjective:    Patient ID: [name], female    DOB: 10-01-1950, 61 y.o.   MRN: [id number]  HPI Patient comes in today for SDA for  new problem evaluation. She has been having ongoing postnasal drainage that is clear for about 6 months. However over the last day or 2 she has noticed increasing thickening mucous and discolored it is hard to me that in her airway. She's taking Mucinex without a lot of help. She denies fever or shortness of breath but has a cough and tightness in her upper chest. No chills no change in neurologic status. Has used over-the-counter antihistamines before without a lot of help. Such as Allegra.  No recent falling change in cardiovascular status.  Review of Systems No fever chills syncope new neurologic symptoms bleeding. She is on thyroid medicine andrivoroxaban and acei  And b blocker   Past history family history social history reviewed in the electronic medical record. Outpatient Encounter Prescriptions as of 02/17/2012  Medication Sig Dispense Refill  . amitriptyline (ELAVIL) 25 MG tablet TAKE 1 TABLET BY MOUTH AT BEDTIME  30 tablet  5  . atenolol (TENORMIN) 50 MG tablet Take 1 in am and 1/2 at night  135 tablet  3  . diclofenac sodium (VOLTAREN) 1 % GEL Apply topically.        . DULoxetine (CYMBALTA) 30 MG capsule Take 30 mg by mouth daily.        . ergocalciferol (VITAMIN D2) 50000 UNITS capsule Take 50,000 Units by mouth once a week.        . fish oil-omega-3 fatty acids 1000 MG capsule Take 2 g by mouth daily.        Marland Kitchen levothyroxine (SYNTHROID, LEVOTHROID) 75 MCG tablet Take 1 tablet (75 mcg total) by mouth daily.  90 tablet  3  . lisinopril-hydrochlorothiazide (PRINZIDE,ZESTORETIC) 20-25 MG per tablet Take 1 tablet by mouth daily.  90 tablet  3  . Rivaroxaban (XARELTO PO) Take by mouth.        . simvastatin (ZOCOR) 20 MG tablet Take 20 mg by mouth at bedtime.        .      .       Facility-Administered Encounter Medications as of 02/17/2012    Medication Dose Route Frequency Provider Last Rate Last Dose  . albuterol (ACCUNEB) nebulizer solution 1.25 mg  1.25 mg Nebulization Once [name], MD      . cyanocobalamin ((VITAMIN B-12)) injection 1,000 mcg  1,000 mcg Intramuscular Once [name], MD           Objective:   Physical Exam BP 130/78  Pulse 68  Temp 98.2 F (36.8 C) (Oral)  SpO2 90% Well-developed well-nourished in no acute distress somewhat hoarse and coughing. Sounds congested HEENT normocephalic TMs clear eyes PERRLA nares minimal congestion face nontender OT uvula absent posterior pharyngeal wall is red but no lesions and no edema Neck: Supple without adenopathy or masses or bruits short neck Chest:  Clear to A&P without wheezes rales or rhonchi question decreased air movement. After nebulizer with albuterol patient states she doesn't feel that much different. Repeat pulse ox was up to 95 96%. CV:  S1-S2 no gallops or murmurs peripheral perfusion is normal Extremities normal perfusion skin no acute rashes.      Assessment & Plan:  Acute respiratory symptoms acute on chronic. She is worried about bronchitis. Unsure she's had asthmatic bronchitis in the past. Change in the coloration of her phlegm.  Am unsure if this is really a bacterial infection her for this is the weekend prescription given for antibiotic if she needs to add on. In the meantime try Zyrtec. Discussed findings and exam with her.  If she has recurrent or persistent symptoms I would even consider changing her ACEI  She has a high risk underlying disease. Total visit 62mns > 50% spent counseling and coordinating care

## 2012-03-05 LAB — HM MAMMOGRAPHY: HM Mammogram: NORMAL

## 2012-03-06 ENCOUNTER — Telehealth: Payer: Self-pay | Admitting: Internal Medicine

## 2012-03-06 NOTE — Telephone Encounter (Signed)
Caller: [place]alea/Patient; PCP: [name].; CB#: [phone];  Call regarding Cough/Congestion;  Seen 02/17/12;  treated with Doxicycline; asking for another RX. After completed antibiotic; cough increased within 1 day.  Afebrile. Mild wheezing at intervals. Frequent throat clearing,  phlem is dark green at times. Short of breath when active. Hx Afib.  Advised to see MD within 4 hrs for new or worsening cough and known cardiac condition per cough Guideline.  No appts remain in Epic but office is open.  Info noted and sent to [facility] pool for call back to pt.

## 2012-03-06 NOTE — Telephone Encounter (Signed)
Called pt but received voicemail.  Left message for the pt to call the office in the morning to make appt or if sx worsen through the evening than she needs to be seen in the ER or Urgent Care.

## 2012-03-07 NOTE — Telephone Encounter (Signed)
Spoke to the pt.  She says she is doing better today.  She said she does not think she needs to be seen today.  She does still have cough, congestion and nasal drip.  Pt instructed to seek an urgent care or ER tomorrow (03-08-12) or later today is sx become worse.  If she needs to be seen on Friday then she may make an appt at that time.

## 2012-03-13 ENCOUNTER — Ambulatory Visit: Payer: BC Managed Care – PPO | Admitting: *Deleted

## 2012-03-26 ENCOUNTER — Ambulatory Visit (INDEPENDENT_AMBULATORY_CARE_PROVIDER_SITE_OTHER): Payer: BC Managed Care – PPO | Admitting: Internal Medicine

## 2012-03-26 ENCOUNTER — Encounter: Payer: Self-pay | Admitting: Internal Medicine

## 2012-03-26 VITALS — BP 142/92 | HR 122 | Temp 98.4°F | Wt 343.0 lb

## 2012-03-26 DIAGNOSIS — R7309 Other abnormal glucose: Secondary | ICD-10-CM

## 2012-03-26 DIAGNOSIS — D518 Other vitamin B12 deficiency anemias: Secondary | ICD-10-CM

## 2012-03-26 DIAGNOSIS — R490 Dysphonia: Secondary | ICD-10-CM

## 2012-03-26 DIAGNOSIS — E039 Hypothyroidism, unspecified: Secondary | ICD-10-CM

## 2012-03-26 DIAGNOSIS — D519 Vitamin B12 deficiency anemia, unspecified: Secondary | ICD-10-CM

## 2012-03-26 DIAGNOSIS — R7303 Prediabetes: Secondary | ICD-10-CM

## 2012-03-26 DIAGNOSIS — I10 Essential (primary) hypertension: Secondary | ICD-10-CM

## 2012-03-26 DIAGNOSIS — G522 Disorders of vagus nerve: Secondary | ICD-10-CM | POA: Insufficient documentation

## 2012-03-26 DIAGNOSIS — I517 Cardiomegaly: Secondary | ICD-10-CM

## 2012-03-26 MED ORDER — LEVOTHYROXINE SODIUM 75 MCG PO TABS: 75.0000 ug | ORAL_TABLET | Freq: Every day | ORAL | Status: DC

## 2012-03-26 MED ORDER — LOSARTAN POTASSIUM-HCTZ 100-25 MG PO TABS: 1.0000 | ORAL_TABLET | Freq: Every day | ORAL | Status: DC

## 2012-03-26 MED ORDER — ATENOLOL 50 MG PO TABS: ORAL_TABLET | ORAL | Status: DC

## 2012-03-26 MED ORDER — CYANOCOBALAMIN 1000 MCG/ML IJ SOLN
1000.0000 ug | Freq: Once | INTRAMUSCULAR | Status: AC
  Administered 2012-03-26: 1000 ug via INTRAMUSCULAR
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
## 2012-03-26 NOTE — Patient Instructions (Signed)
Trial of change form acei to arb blocker   To see if cough throat clearing gets better.  .  Check bp readings  And plan fu in 1-2 months   Consider   Another ent opinion or allergy evaluation.  The other episode .  With the cough could hav been a vagal reaction  With the cough .     If any recurrance then see neurology  Or call.

## 2012-04-05 LAB — HM PAP SMEAR: HM Pap smear: NORMAL

## 2012-05-09 ENCOUNTER — Other Ambulatory Visit (INDEPENDENT_AMBULATORY_CARE_PROVIDER_SITE_OTHER): Payer: BC Managed Care – PPO

## 2012-05-09 DIAGNOSIS — E559 Vitamin D deficiency, unspecified: Secondary | ICD-10-CM

## 2012-05-09 DIAGNOSIS — E785 Hyperlipidemia, unspecified: Secondary | ICD-10-CM

## 2012-05-09 DIAGNOSIS — IMO0001 Reserved for inherently not codable concepts without codable children: Secondary | ICD-10-CM

## 2012-05-09 DIAGNOSIS — I10 Essential (primary) hypertension: Secondary | ICD-10-CM

## 2012-05-09 LAB — LIPID PANEL
HDL: 46.2 mg/dL (ref >39.00)
Triglycerides: 88 mg/dL (ref 0.0–149.0)

## 2012-05-09 LAB — HEPATIC FUNCTION PANEL
ALT: 76 U/L — ABNORMAL HIGH (ref 0–35)
AST: 79 U/L — ABNORMAL HIGH (ref 0–37)
Total Bilirubin: 1.5 mg/dL — ABNORMAL HIGH (ref 0.3–1.2)

## 2012-05-09 LAB — BASIC METABOLIC PANEL
Calcium: 9.1 mg/dL (ref 8.4–10.5)
GFR: 44.4 mL/min — ABNORMAL LOW (ref >60.00)
Glucose, Bld: 132 mg/dL — ABNORMAL HIGH (ref 70–99)
Sodium: 135 mEq/L (ref 135–145)

## 2012-05-09 LAB — HEMOGLOBIN A1C: Hgb A1c MFr Bld: 6.4 % (ref 4.6–6.5)

## 2012-05-16 ENCOUNTER — Ambulatory Visit (INDEPENDENT_AMBULATORY_CARE_PROVIDER_SITE_OTHER): Payer: BC Managed Care – PPO | Admitting: Internal Medicine

## 2012-05-16 ENCOUNTER — Encounter: Payer: Self-pay | Admitting: Internal Medicine

## 2012-05-16 ENCOUNTER — Ambulatory Visit (INDEPENDENT_AMBULATORY_CARE_PROVIDER_SITE_OTHER)
Admission: RE | Admit: 2012-05-16 | Discharge: 2012-05-16 | Disposition: A | Discharge status: Home / Self Care | Payer: BC Managed Care – PPO | Source: Ambulatory Visit | Attending: Internal Medicine | Admitting: Internal Medicine

## 2012-05-16 VITALS — BP 136/80 | HR 95 | Temp 97.4°F

## 2012-05-16 DIAGNOSIS — R945 Abnormal results of liver function studies: Secondary | ICD-10-CM

## 2012-05-16 DIAGNOSIS — R490 Dysphonia: Secondary | ICD-10-CM

## 2012-05-16 DIAGNOSIS — D519 Vitamin B12 deficiency anemia, unspecified: Secondary | ICD-10-CM

## 2012-05-16 DIAGNOSIS — Z23 Encounter for immunization: Secondary | ICD-10-CM

## 2012-05-16 DIAGNOSIS — R0600 Dyspnea, unspecified: Secondary | ICD-10-CM

## 2012-05-16 DIAGNOSIS — R7989 Other specified abnormal findings of blood chemistry: Secondary | ICD-10-CM

## 2012-05-16 DIAGNOSIS — Z8673 Personal history of transient ischemic attack (TIA), and cerebral infarction without residual deficits: Secondary | ICD-10-CM

## 2012-05-16 DIAGNOSIS — D518 Other vitamin B12 deficiency anemias: Secondary | ICD-10-CM

## 2012-05-16 DIAGNOSIS — R0609 Other forms of dyspnea: Secondary | ICD-10-CM

## 2012-05-16 DIAGNOSIS — Z7901 Long term (current) use of anticoagulants: Secondary | ICD-10-CM

## 2012-05-16 DIAGNOSIS — E538 Deficiency of other specified B group vitamins: Secondary | ICD-10-CM

## 2012-05-16 DIAGNOSIS — I119 Hypertensive heart disease without heart failure: Secondary | ICD-10-CM | POA: Insufficient documentation

## 2012-05-16 DIAGNOSIS — I10 Essential (primary) hypertension: Secondary | ICD-10-CM

## 2012-05-16 DIAGNOSIS — R9389 Abnormal findings on diagnostic imaging of other specified body structures: Secondary | ICD-10-CM

## 2012-05-16 MED ORDER — ALBUTEROL SULFATE (5 MG/ML) 0.5% IN NEBU
2.5000 mg | INHALATION_SOLUTION | Freq: Once | RESPIRATORY_TRACT | Status: AC
  Administered 2012-05-16: 2.5 mg via RESPIRATORY_TRACT

## 2012-05-16 MED ORDER — FUROSEMIDE 20 MG PO TABS: 20.0000 mg | ORAL_TABLET | Freq: Every day | ORAL | Status: DC

## 2012-05-16 MED ORDER — CYANOCOBALAMIN 1000 MCG/ML IJ SOLN
1000.0000 ug | Freq: Once | INTRAMUSCULAR | Status: AC
  Administered 2012-05-16: 1000 ug via INTRAMUSCULAR

## 2012-05-16 NOTE — Progress Notes (Signed)
Subjective:    Patient ID: [name], female    DOB: [birth year], 61 y.o.   MRN: [id number]  HPI Patient comes in today for follow up of  multiple medical problems.  Since her last visit she has seen Dr. Redmond Baseman who did an evaluation and felt that she had findings consistent with esophageal laryngeal reflux on exam he placed her on Prilosec but after 3 days she noted increasing swelling in her feet and stopped the medication. She was switched to Prevacid at that point. Over the last number of days she complains of shortness of breath she's had this problem before but it is bad again she think she is training to her throat but no fever doesn't feel like she is wheezing deep in her chest. She just feels more fatigued than usual. No falling or bleeding at this time. Her medications are the same since last time and her blood pressure has been controlled. She still taking Cymbalta 30 mg unsure if it is helping her pain one of her shoulders is bothering her and considering to see her orthopedist. She is to get a B12 injection a flu shot today.   Review of Systems No fever chills UTI symptoms vomiting color change has stopped the ibuprofen as most of the time however is taking Tylenol once a day for pain.   Past history family history social history reviewed in the electronic medical record. Outpatient Encounter Prescriptions as of 05/16/2012  Medication Sig Dispense Refill  . amitriptyline (ELAVIL) 25 MG tablet TAKE 1 TABLET BY MOUTH AT BEDTIME  30 tablet  5  . atenolol (TENORMIN) 50 MG tablet Take 1 in am and 1/2 at night  135 tablet  3  . diclofenac sodium (VOLTAREN) 1 % GEL Apply topically.        . DULoxetine (CYMBALTA) 30 MG capsule Take 30 mg by mouth daily.        . ergocalciferol (VITAMIN D2) 50000 UNITS capsule Take 50,000 Units by mouth once a week.        . fish oil-omega-3 fatty acids 1000 MG capsule Take 2 g by mouth daily.        . lansoprazole (PREVACID) 30 MG capsule Take 30 mg by  mouth 2 (two) times daily.      Marland Kitchen levothyroxine (SYNTHROID, LEVOTHROID) 75 MCG tablet Take 1 tablet (75 mcg total) by mouth daily.  90 tablet  3  . losartan-hydrochlorothiazide (HYZAAR) 100-25 MG per tablet Take 1 tablet by mouth daily.  30 tablet  3  . Rivaroxaban (XARELTO PO) Take by mouth.        . simvastatin (ZOCOR) 20 MG tablet Take 20 mg by mouth at bedtime.        .       Facility-Administered Encounter Medications as of 05/16/2012  Medication Dose Route Frequency Provider Last Rate Last Dose  . albuterol (ACCUNEB) nebulizer solution 1.25 mg  1.25 mg Nebulization Once [name], MD      . albuterol (PROVENTIL) (5 MG/ML) 0.5% nebulizer solution 2.5 mg  2.5 mg Nebulization Once [name], MD   2.5 mg at 05/16/12 1551  . cyanocobalamin ((VITAMIN B-12)) injection 1,000 mcg  1,000 mcg Intramuscular Once [name], MD      . cyanocobalamin ((VITAMIN B-12)) injection 1,000 mcg  1,000 mcg Intramuscular Once [name], MD   1,000 mcg at 05/16/12 1548       Objective:   Physical Exam BP 136/80  Pulse  95  Temp 97.4 F (36.3 C) (Oral)  SpO2 91% Wt Readings from Last 3 Encounters:  03/26/12 343 lb (155.584 kg)  10/28/11 300 lb (136.079 kg)  09/27/11 332 lb (150.594 kg)  WDWN in nad hoarse  increse esp talking ok     HEENT no acute findings Neck  no obvious masses cannot tell if she has JVD Chest:  Clear to A&P without wheezes rales or rhonchi  Decrease air flow  CV:  S1-S2 no gallops or murmurs peripheral perfusion is normal extr 1-2+ edema  No redness o warmth Oriented cognitively intact ambulatory independent. After nebulizer with albuterol she states she doesn't feel any better but her pulse ox was up to 97. Air movement somewhat better no active wheezing otherwise.  Lab Results  Component Value Date   WBC 7.7 06/07/2011   HGB 12.4 06/07/2011   HCT 38.6 06/07/2011   PLT 202 06/07/2011   GLUCOSE 132* 05/09/2012   CHOL 120 05/09/2012   TRIG 88.0 05/09/2012   HDL  46.20 05/09/2012   LDLDIRECT 139.4 06/30/2010   LDLCALC 56 05/09/2012   ALT 76* 05/09/2012   AST 79* 05/09/2012   NA 135 05/09/2012   K 4.3 05/09/2012   CL 96 05/09/2012   CREATININE 1.3* 05/09/2012   BUN 20 05/09/2012   CO2 29 05/09/2012   TSH 2.058 10/26/2010   INR 1.04 03/18/2011   HGBA1C 6.4 05/09/2012   MICROALBUR 0.2 07/09/2009       Assessment & Plan:  Dyspnea she has had a long-standing problem with dyspnea however the last 5-6 days it appears to a good  be worse. Her pulse ox seems to respond from her bronchodilator however she doesn't feel it really helps. She's noted some swelling in her feet that she feels is from the Prilosec and getting better but it could be from other causes. We will get a chest x-ray if this has not been done in a while consider adding Lasix. Discussed getting more laboratory studies such as a BNP but we will wait for couple weeks we'll repeat her labs  Upper airway a issues with esophageal laryngeal reflux     Uncertain if OSa contributing also to her situation she never had a followup sleep study after the initial before her throat surgery. They need further evaluation also Obesity morbid ;certainly can cause restrictive disease and could be contributing. Advise cxray and plan pulmonary eval we know she has ELR but shouldn't cause such dyspnea but itself. encertain how much obesity or lung disease contributing.  On anticoagulation  suspect risk of clotting would be low. Abnormal lfts  minorly elevated in the past more at this time. Will repeat in a few weeks.  Tylenol uncertain as Cymbalta to do this she can try weaning unsure if this is helping her or not. With her pain discussed weaning every other day Cr up to 1.3  Bump on meds  Could be    From the [facility] B12 deficiency shots today Prediabetes versus early diabetes A1c is stable down 0.1 6.4  Abnormal x ray : Addendum ;x-ray showed some opacity in the right lobe unclear etiology moderately enlarged heart with venous  congestion but no pulmonary edema. Plan getting CT scan and pulmonary to see.  She apparently has had an echocardiogram when she had her stroke but I cannot find it in the electronic record  uncertain if this should be repeated.

## 2012-05-16 NOTE — Patient Instructions (Addendum)
Uncertain which factors are causing your shortness of breath..  But oxygen level is better after nebulizer . Can wean the cymbalta to every other day for  a few weeks. If pain gets worse  Then go back to daily as may be helping  your pain.  Liver tests need to be followed.  Blood sugar is good.  Get a chest x ray and  We may add a couple days of diuretic .  Will plan on getting  Pulmonary to see you also. We know you have laryngeal  reflux  But this doesn't all of the explain shortness of breath.  Blood pressure is good.

## 2012-05-21 ENCOUNTER — Telehealth: Payer: Self-pay | Admitting: Internal Medicine

## 2012-05-21 ENCOUNTER — Other Ambulatory Visit: Payer: BC Managed Care – PPO

## 2012-05-21 NOTE — Telephone Encounter (Signed)
Pt requesting a call back  [name] she is having a reaction to medication and would like to know what her next step is

## 2012-05-21 NOTE — Telephone Encounter (Signed)
Spoke to the pt.  She informed me that she has switched back to lisinopril-HCTZ from losartan-HCTZ.  She only stopped taking the lisinopril in the past because it made her cough.  Was informed by another doctor recently that she has cough because of acid reflux and not due to med.  She says this medication works best.  Was seen by [facility] in the office last week and given extra lasix.  This has not helped.  Edema has not went down in her legs/ankles.  Made an appt to be seen by Roxy Cedar, FNP to discuss med and swelling on 05/22/12 @ 11AM.

## 2012-05-22 ENCOUNTER — Encounter: Payer: Self-pay | Admitting: Family

## 2012-05-22 ENCOUNTER — Ambulatory Visit (INDEPENDENT_AMBULATORY_CARE_PROVIDER_SITE_OTHER): Payer: BC Managed Care – PPO | Admitting: Family

## 2012-05-22 VITALS — BP 118/60 | HR 116 | Temp 97.6°F

## 2012-05-22 DIAGNOSIS — R0609 Other forms of dyspnea: Secondary | ICD-10-CM

## 2012-05-22 DIAGNOSIS — R0600 Dyspnea, unspecified: Secondary | ICD-10-CM

## 2012-05-22 DIAGNOSIS — R0989 Other specified symptoms and signs involving the circulatory and respiratory systems: Secondary | ICD-10-CM

## 2012-05-22 DIAGNOSIS — R945 Abnormal results of liver function studies: Secondary | ICD-10-CM

## 2012-05-22 DIAGNOSIS — R7989 Other specified abnormal findings of blood chemistry: Secondary | ICD-10-CM

## 2012-05-22 DIAGNOSIS — R609 Edema, unspecified: Secondary | ICD-10-CM

## 2012-05-22 DIAGNOSIS — I10 Essential (primary) hypertension: Secondary | ICD-10-CM

## 2012-05-22 LAB — BASIC METABOLIC PANEL
Chloride: 99 mEq/L (ref 96–112)
Potassium: 3.8 mEq/L (ref 3.5–5.1)

## 2012-05-22 LAB — HEPATIC FUNCTION PANEL
Albumin: 3.8 g/dL (ref 3.5–5.2)
Total Bilirubin: 2 mg/dL — ABNORMAL HIGH (ref 0.3–1.2)
Total Protein: 7.1 g/dL (ref 6.0–8.3)

## 2012-05-22 LAB — BRAIN NATRIURETIC PEPTIDE: Pro B Natriuretic peptide (BNP): 311 pg/mL — ABNORMAL HIGH (ref 0.0–100.0)

## 2012-05-22 MED ORDER — FUROSEMIDE 40 MG PO TABS: 40.0000 mg | ORAL_TABLET | Freq: Every day | ORAL | Status: DC

## 2012-05-22 NOTE — Patient Instructions (Addendum)
Peripheral Edema You have swelling in your legs (peripheral edema). This swelling is due to excess accumulation of salt and water in your body. Edema may be a sign of heart, kidney or liver disease, or a side effect of a medication. It may also be due to problems in the leg veins. Elevating your legs and using special support stockings may be very helpful, if the cause of the swelling is due to poor venous circulation. Avoid long periods of standing, whatever the cause. Treatment of edema depends on identifying the cause. Chips, pretzels, pickles and other salty foods should be avoided. Restricting salt in your diet is almost always needed. Water pills (diuretics) are often used to remove the excess salt and water from your body via urine. These medicines prevent the kidney from reabsorbing sodium. This increases urine flow. Diuretic treatment may also result in lowering of potassium levels in your body. Potassium supplements may be needed if you have to use diuretics daily. Daily weights can help you keep track of your progress in clearing your edema. You should call your caregiver for follow up care as recommended. SEEK IMMEDIATE MEDICAL CARE IF:   You have increased swelling, pain, redness, or heat in your legs.   You develop shortness of breath, especially when lying down.   You develop chest or abdominal pain, weakness, or fainting.   You have a fever.  Document Released: 09/29/2004 Document Revised: 08/11/2011 Document Reviewed: 09/09/2009 [facility] Patient Information 2012 [place].

## 2012-05-22 NOTE — Progress Notes (Signed)
Subjective:    Patient ID: [name], female    DOB: 1951-04-26, 61 y.o.   MRN: [id number]  HPI 61 year old obese white female, nonsmoker, patient of Dr. Regis Bill is in today with persistent peripheral edema. Initially, patient was am sure if that was related to recently starting Prilosec, therefore she had her Prilosec changed to Prevacid it. The edema did not resolve. 3-4 weeks ago, patient patient was switched from Lisinopril to Losartan because she had c/o cough. The med change did not help the cough. However, patient felt like her symptoms may be related to the initiation of Losartan. She has now gone back to Lisinopril. She has been taking Lasix x 4 days and today her edema is beginning to improve. Denies and chest pain, SOB, no decreased urine output.   Patient does admit to eating more sodium lately and may be contributing to her edema.    Review of Systems  Constitutional: Negative.   HENT: Negative.   Respiratory: Negative.  Negative for shortness of breath and wheezing.   Cardiovascular: Positive for leg swelling. Negative for chest pain and palpitations.  Gastrointestinal: Negative.   Musculoskeletal: Negative.   Skin: Negative.   Neurological: Negative.   Hematological: Negative.   Psychiatric/Behavioral: Negative.    Past Medical History  Diagnosis Date  . Hypertension   . Hypothyroid   . Obesity   . Post-menopausal bleeding   . Anemia   . Osteoporosis   . B12 deficiency   . CVA (cerebral infarction) 2 19 2012     r frontal  thrombotic    . LVH (left ventricular hypertrophy)      by echo 2012  . Atrial fibrillation   . Stroke     10/2010  . Left leg weakness     r/t stroke 10/2010  . Sleep apnea     no cpap - surgery to removed tonsils/cut down uvula  . Blood transfusion     at pre-op appt 10/2, per pt, no hx of bld transfusion  . Arthritis     kness, back, shoulders - no meds  . Hyperlipidemia     History   Social History  . Marital Status: Divorced     Spouse Name: N/A    Number of Children: N/A  . Years of Education: N/A   Occupational History  . Not on file.   Social History Main Topics  . Smoking status: Never Smoker   . Smokeless tobacco: Never Used  . Alcohol Use: Yes     socially - every three or four months  . Drug Use: No  . Sexually Active: Not Currently   Other Topics Concern  . Not on file   Social History Narrative   Never smokedDivorcedHH of 1 No [employer]etsChem[employer]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hasnt worked since CVA    Past Surgical History  Procedure Date  . 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 Rt shoulder surgery 06/2010  . Eye muscle surgery 1952    left eye  . Svd     x 1  . Dilation and curettage of uterus   . Colonoscopy   . Tonsillectomy   . Nose surgery   . Hysteroscopy w/d&c 06/13/2011    Procedure: DILATATION AND CURETTAGE (D&C) /HYSTEROSCOPY;  Surgeon: Felipa Emory;  Location: [facility];  Service: Gynecology;  Laterality: N/A;    Family History  Problem Relation Age of Onset  .  COPD Mother   . Hypertension Mother   . Osteoporosis Mother   . Diabetes Father   . Hypertension Father   . Liver cancer Father   . Hypertension Sister   . Hypertension Brother     Allergies  Allergen Reactions  . Ambien (Zolpidem Tartrate)     Amnesia and fall   . Diphenhydramine Hcl Other (See Comments)    WELTS  . Motrin (Ibuprofen) Other (See Comments)    ANKLE EDEMA   . Diphenhydramine Hcl Rash    Topical diphenhydramine  . Prilosec (Omeprazole)     Swelling in ankles.    Current Outpatient Prescriptions on File Prior to Visit  Medication Sig Dispense Refill  . amitriptyline (ELAVIL) 25 MG tablet TAKE 1 TABLET BY MOUTH AT BEDTIME  30 tablet  5  . atenolol (TENORMIN) 50 MG tablet Take 1 in am and 1/2 at night  135 tablet  3  . diclofenac sodium (VOLTAREN) 1 % GEL Apply topically.        . DULoxetine (CYMBALTA) 30 MG capsule Take 30 mg by mouth daily.         . ergocalciferol (VITAMIN D2) 50000 UNITS capsule Take 50,000 Units by mouth once a week.        . fish oil-omega-3 fatty acids 1000 MG capsule Take 2 g by mouth daily.        . lansoprazole (PREVACID) 30 MG capsule Take 30 mg by mouth 2 (two) times daily.      Marland Kitchen levothyroxine (SYNTHROID, LEVOTHROID) 75 MCG tablet Take 1 tablet (75 mcg total) by mouth daily.  90 tablet  3  . losartan-hydrochlorothiazide (HYZAAR) 100-25 MG per tablet Take 1 tablet by mouth daily.  30 tablet  3  . Rivaroxaban (XARELTO PO) Take by mouth.        . simvastatin (ZOCOR) 20 MG tablet Take 20 mg by mouth at bedtime.        Marland Kitchen DISCONTD: furosemide (LASIX) 20 MG tablet Take 1 tablet (20 mg total) by mouth daily.  7 tablet  0  . DISCONTD: TOPAMAX 50 MG tablet Take 50 mg by mouth.       Current Facility-Administered Medications on File Prior to Visit  Medication Dose Route Frequency Provider Last Rate Last Dose  . albuterol (ACCUNEB) nebulizer solution 1.25 mg  1.25 mg Nebulization Once [name], MD      . cyanocobalamin ((VITAMIN B-12)) injection 1,000 mcg  1,000 mcg Intramuscular Once [name], MD        BP 118/60  Pulse 116  Temp 97.6 F (36.4 C) (Oral)  SpO2 90%chart    Objective:   Physical Exam  Constitutional: She is oriented to person, place, and time. She appears well-developed and well-nourished.  HENT:  Right Ear: External ear normal.  Left Ear: External ear normal.  Mouth/Throat: Oropharynx is clear and moist.  Neck: Normal range of motion. Neck supple.  Cardiovascular: Normal rate, regular rhythm and normal heart sounds.   Pulmonary/Chest: Effort normal and breath sounds normal.  Musculoskeletal: She exhibits edema. She exhibits no tenderness.       2+ pitting edema to the lower extremities.   Neurological: She is alert and oriented to person, place, and time.  Skin: Skin is warm and dry.  Psychiatric: She has a normal mood and affect.          Assessment & Plan:  Assessment:  Peripheral Edema related to increase in sodium intake, Obese  Plan: Increase  lasix temporarily to 60m once daily since she is beginning to get mild relief at 289m Labs obtained. Elevated legs, support hose. Call the office if symptoms worsen or persist. Recheck as scheduled and as needed.

## 2012-05-25 ENCOUNTER — Telehealth: Payer: Self-pay | Admitting: Family Medicine

## 2012-05-25 NOTE — Telephone Encounter (Signed)
Patient called to inform me that she seen Padonda on Tuesday.  Increased her Lasix to 2 pills.  She has noticed the edema has gone down.  However, she is dizzy.  This is a new sx.  As it is after hours on a Friday afternoon, I advised an Urgent Care, ER etc visit if sx worsen.  Pt is fearful to get up and move because she may fall.  She will call me on Monday to notify me of any progress.

## 2012-05-28 ENCOUNTER — Institutional Professional Consult (permissible substitution): Payer: BC Managed Care – PPO | Admitting: Internal Medicine

## 2012-05-30 ENCOUNTER — Institutional Professional Consult (permissible substitution): Payer: BC Managed Care – PPO | Admitting: Pulmonary Disease

## 2012-05-30 ENCOUNTER — Ambulatory Visit: Payer: BC Managed Care – PPO | Admitting: Internal Medicine

## 2012-06-05 DIAGNOSIS — M8430XA Stress fracture, unspecified site, initial encounter for fracture: Secondary | ICD-10-CM

## 2012-06-05 HISTORY — DX: Stress fracture, unspecified site, initial encounter for fracture: M84.30XA

## 2012-06-14 ENCOUNTER — Other Ambulatory Visit: Payer: Self-pay | Admitting: Internal Medicine

## 2012-06-19 ENCOUNTER — Ambulatory Visit (INDEPENDENT_AMBULATORY_CARE_PROVIDER_SITE_OTHER): Payer: BC Managed Care – PPO | Admitting: Internal Medicine

## 2012-06-19 ENCOUNTER — Encounter: Payer: Self-pay | Admitting: Internal Medicine

## 2012-06-19 VITALS — BP 118/60 | HR 100 | Temp 98.0°F | Wt 318.0 lb

## 2012-06-19 DIAGNOSIS — R945 Abnormal results of liver function studies: Secondary | ICD-10-CM

## 2012-06-19 DIAGNOSIS — D518 Other vitamin B12 deficiency anemias: Secondary | ICD-10-CM

## 2012-06-19 DIAGNOSIS — R7303 Prediabetes: Secondary | ICD-10-CM

## 2012-06-19 DIAGNOSIS — I4891 Unspecified atrial fibrillation: Secondary | ICD-10-CM

## 2012-06-19 DIAGNOSIS — R0602 Shortness of breath: Secondary | ICD-10-CM

## 2012-06-19 DIAGNOSIS — R490 Dysphonia: Secondary | ICD-10-CM

## 2012-06-19 DIAGNOSIS — R7309 Other abnormal glucose: Secondary | ICD-10-CM

## 2012-06-19 DIAGNOSIS — R0600 Dyspnea, unspecified: Secondary | ICD-10-CM

## 2012-06-19 DIAGNOSIS — I10 Essential (primary) hypertension: Secondary | ICD-10-CM

## 2012-06-19 DIAGNOSIS — R7989 Other specified abnormal findings of blood chemistry: Secondary | ICD-10-CM | POA: Insufficient documentation

## 2012-06-19 DIAGNOSIS — R0609 Other forms of dyspnea: Secondary | ICD-10-CM

## 2012-06-19 DIAGNOSIS — E785 Hyperlipidemia, unspecified: Secondary | ICD-10-CM

## 2012-06-19 DIAGNOSIS — D519 Vitamin B12 deficiency anemia, unspecified: Secondary | ICD-10-CM

## 2012-06-19 DIAGNOSIS — R9389 Abnormal findings on diagnostic imaging of other specified body structures: Secondary | ICD-10-CM

## 2012-06-19 DIAGNOSIS — R799 Abnormal finding of blood chemistry, unspecified: Secondary | ICD-10-CM

## 2012-06-19 LAB — BASIC METABOLIC PANEL
BUN: 23 mg/dL (ref 6–23)
Chloride: 97 mEq/L (ref 96–112)
GFR: 41.06 mL/min — ABNORMAL LOW (ref >60.00)
Potassium: 4.7 mEq/L (ref 3.5–5.1)

## 2012-06-19 LAB — HEPATIC FUNCTION PANEL
ALT: 45 U/L — ABNORMAL HIGH (ref 0–35)
AST: 59 U/L — ABNORMAL HIGH (ref 0–37)
Bilirubin, Direct: 0.3 mg/dL (ref 0.0–0.3)
Total Bilirubin: 0.9 mg/dL (ref 0.3–1.2)

## 2012-06-19 MED ORDER — CYANOCOBALAMIN 1000 MCG/ML IJ SOLN
1000.0000 ug | Freq: Once | INTRAMUSCULAR | Status: AC
  Administered 2012-06-19: 1000 ug via INTRAMUSCULAR

## 2012-06-19 NOTE — Patient Instructions (Signed)
AGREE WITH current plan . Will notify you  of labs when available.  can view on line .  Get a chest x ray .   To ensure if better .  Then plan Fu  Call for   Refills.   Before insurance runs out.

## 2012-06-19 NOTE — Progress Notes (Signed)
Subjective:    Patient ID: [name], female    DOB: 1950/10/02, 61 y.o.   MRN: [id number]  HPI Patient comes in today for follow up of  multiple medical problems.  She was seen about a month ago  for swelling and shortness of breath enlarged heart on x-ray abnormal liver tests and a slightly increased in her creatinine. At that time she was seen and had diuretic therapy implemented was also told to stop the low dose Cymbalta and simvastatin. She's done that since that time she hasn't seen a pulmonary doctor has seen cardiology Dr. Gwenlyn Found on October 2 2 said her heart was fine. At that time her blood pressure was 100/58 a pulse of 125. He was to see her back in 6 months. He was felt that she might have had an allergic reaction to losartan and had a dry cough with an ACE inhibitor. Since that time her leg swelling is much less she feels so much better. Needs a B12 shot today. When she was on the other medication she states that she felt woozy somewhat dizzy and weak feeling there was no itching or hives or angioedema. Of note on her chest x-ray showed enlarged heart with vascular congestion but no pulmonary edema and some discoid opacity in the right lung that was felt to need short-term followup and/or CT.  Review of Systems No current chest pain or shortness of breath is better she is on aggressive PPI therapy for esophageal laryngeal reflux and hoarseness followup with Dr. Redmond Baseman soon. There is no vomiting or diarrhea and no unusual bleeding. She is on anticoagulation for chronic A. Fib  Past history family history social history reviewed in the electronic medical record.  Outpatient Encounter Prescriptions as of 06/19/2012  Medication Sig Dispense Refill  . lisinopril-hydrochlorothiazide (PRINZIDE,ZESTORETIC) 20-25 MG per tablet Take 1 tablet by mouth daily.      Marland Kitchen amitriptyline (ELAVIL) 25 MG tablet TAKE 1 TABLET BY MOUTH AT BEDTIME  30 tablet  5  . atenolol (TENORMIN) 50 MG tablet Take 1  in am and 1/2 at night  135 tablet  3  . diclofenac sodium (VOLTAREN) 1 % GEL Apply topically.        . ergocalciferol (VITAMIN D2) 50000 UNITS capsule Take 50,000 Units by mouth once a week.        . fish oil-omega-3 fatty acids 1000 MG capsule Take 2 g by mouth daily.        . furosemide (LASIX) 20 MG tablet TAKE 1 TABLET BY MOUTH ONCE DAILY  30 tablet  0  . lansoprazole (PREVACID) 30 MG capsule Take 30 mg by mouth 2 (two) times daily.      Marland Kitchen levothyroxine (SYNTHROID, LEVOTHROID) 75 MCG tablet Take 1 tablet (75 mcg total) by mouth daily.  90 tablet  3  . Rivaroxaban (XARELTO PO) Take by mouth.        . DISCONTD: DULoxetine (CYMBALTA) 30 MG capsule Take 30 mg by mouth daily.        Marland Kitchen DISCONTD: furosemide (LASIX) 40 MG tablet Take 1 tablet (40 mg total) by mouth daily.  15 tablet  0  . DISCONTD: losartan-hydrochlorothiazide (HYZAAR) 100-25 MG per tablet Take 1 tablet by mouth daily.  30 tablet  3  . DISCONTD: simvastatin (ZOCOR) 20 MG tablet Take 20 mg by mouth at bedtime.         Facility-Administered Encounter Medications as of 06/19/2012  Medication Dose Route Frequency Provider Last Rate Last Dose  .  albuterol (ACCUNEB) nebulizer solution 1.25 mg  1.25 mg Nebulization Once [name], MD      . cyanocobalamin ((VITAMIN B-12)) injection 1,000 mcg  1,000 mcg Intramuscular Once [name], MD           Objective:   Physical Exam BP 118/60  Pulse 100  Temp 98 F (36.7 C) (Oral)  Wt 318 lb (144.244 kg)  SpO2 91%  Well-developed well-nourished in no acute distress moderate hoarseness no change examination of the neck shows no JVD chest clear to auscultation cardiac S1-S2 no murmurs or gallops noted irregularly irregular rate about 88. Extremities much less edema than last visit trace. Gait   Mildly paretic but steady    Assessment & Plan:   SOB much improved symptoms consistent with fluid overload her last creatinine was 1.7 chronic AF Obesity  Pre diabetes LFTS  coincident  with above scenario consider medication effect versus congestion of the liver needs repeat liver tests today Enlarged heart on ct  apparently stable cardiac status at present she did have a slightly elevated BNP initially. Edema much better is still on one Lasix a day. Abnormal chest x-ray CT scan was ordered but not done is reasonable to just repeat the chest x-ray to see if the abnormality is gone. Esophageal laryngeal reflux followed by Dr. Redmond Baseman Side effect of losartan and ACE inhibitors. Avoiding them and now. B12 deficiency injection today  She will be losing her insurance before she gets on Medicare she is stable and not in followup in about 4 months call the meantime for refills if needed

## 2012-06-21 ENCOUNTER — Institutional Professional Consult (permissible substitution): Payer: BC Managed Care – PPO | Admitting: Pulmonary Disease

## 2012-06-21 NOTE — Progress Notes (Signed)
Quick Note:  Pt informed and voiced understanding ______

## 2012-07-04 ENCOUNTER — Telehealth: Payer: Self-pay | Admitting: Family Medicine

## 2012-07-04 NOTE — Telephone Encounter (Signed)
Contact patient and ask when she will be able to get her fu chest x ray done

## 2012-07-04 NOTE — Telephone Encounter (Signed)
We received notification this pt did not obtain the chest films ordered 06/19/12. Thank you.

## 2012-07-05 NOTE — Telephone Encounter (Signed)
Misty, if Dr. Regis Bill is agreeable to pt's wishes, can someone please discontinue and remove the xray order from [facility]? Thanks.

## 2012-07-05 NOTE — Telephone Encounter (Signed)
Left message for the pt to return my call.

## 2012-07-05 NOTE — Telephone Encounter (Signed)
Misty, this should have been routed to you. Thanks!

## 2012-07-05 NOTE — Telephone Encounter (Signed)
She had forgotten that she was to have x-ray.  She says she is fine now and does not wish to proceed.

## 2012-07-11 ENCOUNTER — Other Ambulatory Visit: Payer: Self-pay | Admitting: Sports Medicine

## 2012-07-11 ENCOUNTER — Other Ambulatory Visit: Payer: Self-pay | Admitting: Internal Medicine

## 2012-07-12 ENCOUNTER — Telehealth: Payer: Self-pay | Admitting: Internal Medicine

## 2012-07-12 ENCOUNTER — Telehealth: Payer: Self-pay | Admitting: *Deleted

## 2012-07-12 NOTE — Telephone Encounter (Signed)
Pt needs you to call her concerning a script for amitriptyline.  Pt needs Dr Regis Bill to prescribe so her insurance will pay. Pt no is [phone]. Pt pharm is [facility].

## 2012-07-12 NOTE — Telephone Encounter (Signed)
Message copied by Ocie Bob on Thu Jul 12, 2012 12:05 PM ------      Message from: Stefanie Libel      Created: Thu Jul 12, 2012  9:18 AM       [name]            This patient is asking for refill of amitriptyline but has not seen [place] in 7 months.  She would need to check with Dr Regis Bill on this one as she is on several medicaitons.            I will copy to Dr Regis Bill.            KBF

## 2012-07-12 NOTE — Telephone Encounter (Signed)
Called pt- gave her the message.  She was agreeable to check with Dr. Regis Bill.

## 2012-07-13 ENCOUNTER — Other Ambulatory Visit: Payer: Self-pay | Admitting: Family Medicine

## 2012-07-13 MED ORDER — AMITRIPTYLINE HCL 25 MG PO TABS: 25.0000 mg | ORAL_TABLET | Freq: Every evening | ORAL | Status: DC | PRN

## 2012-07-13 NOTE — Telephone Encounter (Signed)
I spoke to the pt.  Dr. Oneida Alar was filling this for her.  She has damage to her feet.  She has not been in to see Dr. Oneida Alar since last March and he will not fill it for her.  She was instructed to see if you would fill it so she would not have to make another appt with Dr. Oneida Alar.  Please advise.  Thanks!!

## 2012-07-13 NOTE — Telephone Encounter (Signed)
Pt notified and sent to pharmacy.  She will get chest x-ray before her next visit.

## 2012-07-13 NOTE — Telephone Encounter (Signed)
Left message for the pt to return my call.

## 2012-07-13 NOTE — Telephone Encounter (Signed)
Can refill for  90 days ,  Also when she gets insurance  I think she still needs  The follow up chest x ray  Even if she feels fine .

## 2012-07-17 ENCOUNTER — Other Ambulatory Visit: Payer: Self-pay | Admitting: Family Medicine

## 2012-07-17 ENCOUNTER — Ambulatory Visit (INDEPENDENT_AMBULATORY_CARE_PROVIDER_SITE_OTHER)
Admission: RE | Admit: 2012-07-17 | Discharge: 2012-07-17 | Disposition: A | Discharge status: Home / Self Care | Payer: BC Managed Care – PPO | Source: Ambulatory Visit | Attending: Internal Medicine | Admitting: Internal Medicine

## 2012-07-17 DIAGNOSIS — R9389 Abnormal findings on diagnostic imaging of other specified body structures: Secondary | ICD-10-CM

## 2012-09-05 DIAGNOSIS — M779 Enthesopathy, unspecified: Secondary | ICD-10-CM

## 2012-09-05 HISTORY — DX: Enthesopathy, unspecified: M77.9

## 2012-10-04 ENCOUNTER — Telehealth: Payer: Self-pay | Admitting: Internal Medicine

## 2012-10-04 NOTE — Telephone Encounter (Signed)
Patient Information:  Caller Name: [name]  Phone: [phone]  Patient: [name], [name]  Gender: Female  DOB: 08-11-1951  Age: 62 Years  PCP: [name] (Family Practice)  Office Follow Up:  Does the office need to follow up with this patient?: Yes  Instructions For The Office: OFFICE, PT DID NOT WANT APPT AS SHE IS IN BETWEEN INSURANCES.  PT WANTS TO KNOW IF [name] CAN RECOMMEND AN OTC MEDICATION FOR HER REFLUX OR IF SHE SHOULD GO BACK TO SEE [name] (GI DOCTOR) FOR MEDICATION.  PT ALSO ADDED AT THE END OF THE CALL THAT SHE NEEDS A REFILL OF HER LISINOPRIL MEDICATION.  PT USES [facility] [address] AND [address]  RN Note:  Pt states medications (Prilosec and Prevacid) where prescribed by Stacy [name].  Pt was taking these for reflux,  Symptoms  Reason For Call & Symptoms: pt thinks that she is having a reaction to the prevacid.  Pt reports she was having severe diarrhea from this.  Pt started it 3 weeks ago and stopped it on 09/26/12. Pt states her diarrhea resolved after she stopped taking the medication.  Pt states prior to the Prevacid she began taking Prilosec and had an allergic reaction to it with feet swelling.    Pt is now not sure what she should do regarding medication.   Reviewed Health History In EMR: Yes  Reviewed Medications In EMR: Yes  Reviewed Allergies In EMR: Yes  Reviewed Surgeries / Procedures: Yes  Date of Onset of Symptoms: Unknown  Guideline(s) Used:  No Protocol Available - Sick Adult  Disposition Per Guideline:   Discuss with PCP and Callback by Nurse Today  Reason For Disposition Reached:   Nursing judgment  Advice Given:  N/A

## 2012-10-05 MED ORDER — RANITIDINE HCL 150 MG PO CAPS: 150.0000 mg | ORAL_CAPSULE | Freq: Two times a day (BID) | ORAL | Status: DC

## 2012-10-05 MED ORDER — LISINOPRIL-HYDROCHLOROTHIAZIDE 20-25 MG PO TABS: 1.0000 | ORAL_TABLET | Freq: Every day | ORAL | Status: DC

## 2012-10-05 NOTE — Telephone Encounter (Signed)
Spoke to pt told her can try Ranitidine 150 mg one twice a day or pepcid 20 mg per day per Dr. Regis Bill. This is a different acid suppressing med and not as powerful as the other meds mays need different Rx. Pt verbalized understanding and stated will try Ranitidine but only wants 30 day supply with one refill to see how it works and I have an appt in March. Told pt that is fine will sent to pharmacy as requested and if works can get more refills when she comes in for [place]. Pt verbalized understanding. Rx's sent to pharmacy.

## 2012-10-05 NOTE — Telephone Encounter (Signed)
Can try ranitidine  150 mg bid or pepcid  20 mg per day    This is a different acid suppressing med and not as powerful as the other meds .     May need a different rx    Consider ROV or ov with Dr Redmond Baseman  ENT.

## 2012-10-10 ENCOUNTER — Other Ambulatory Visit: Payer: Self-pay | Admitting: Internal Medicine

## 2012-10-16 ENCOUNTER — Other Ambulatory Visit: Payer: Self-pay | Admitting: Family Medicine

## 2012-10-16 MED ORDER — AMITRIPTYLINE HCL 25 MG PO TABS: ORAL_TABLET | ORAL | Status: DC

## 2012-10-24 ENCOUNTER — Ambulatory Visit (INDEPENDENT_AMBULATORY_CARE_PROVIDER_SITE_OTHER): Payer: BC Managed Care – PPO | Admitting: Sports Medicine

## 2012-10-24 VITALS — BP 113/73 | Ht 62.0 in | Wt 300.0 lb

## 2012-10-24 DIAGNOSIS — M25579 Pain in unspecified ankle and joints of unspecified foot: Secondary | ICD-10-CM

## 2012-10-24 NOTE — Progress Notes (Signed)
  Subjective:    Patient ID: [name], female    DOB: Nov 20, 1950, 62 y.o.   MRN: [id number]  HPI chief complaint: Left foot  She comes in today complaining of 2 days of lateral left foot pain. No trauma that she can recall but gradual onset of pain and swelling. She has a history of stress fractures in the right foot but no previous stress fractures in the left foot. Her pain and swelling have improved overnight with a compression wrap. Today her pain is 3/10. It is worse with weightbearing and improves at rest. No fevers or chills.  Medical history is reviewed as are her medications. List is quite extensive and available for review in the chart    Review of Systems     Objective:   Physical Exam Obese, no acute distress  Left foot: There is some slight tenderness along the lateral aspect of the midfoot with diffuse tenderness to palpation. No erythema. No skin breakdown. Patient has pes planus with standing and pronation with walking. She is neurovascularly intact distally. Walking with a slight limp and the use of a cane.       Assessment & Plan:  1. 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 Obesity  Patient likely has some left mid foot DJD as a combination of her obesity and her pronation/pes planus. She is getting some good results already with her off of the shelf compression sleeve. She will continue with this and will elevate the affected extremity as much as possible. I recommended ice 3 times daily for the next 48 hours. She can use Tylenol as needed for pain but should avoid NSAIDs given her multiple medical comorbidities. We will try a pair of green sports insoles with scaphoid pads and the patient will followup with me in 2 weeks for recheck. Call with questions or concerns in the interim.

## 2012-10-29 ENCOUNTER — Encounter: Payer: Self-pay | Admitting: Nurse Practitioner

## 2012-10-29 DIAGNOSIS — M549 Dorsalgia, unspecified: Secondary | ICD-10-CM

## 2012-10-29 DIAGNOSIS — R269 Unspecified abnormalities of gait and mobility: Secondary | ICD-10-CM | POA: Insufficient documentation

## 2012-11-14 ENCOUNTER — Encounter: Payer: Self-pay | Admitting: Sports Medicine

## 2012-11-14 ENCOUNTER — Ambulatory Visit
Admission: RE | Admit: 2012-11-14 | Discharge: 2012-11-14 | Disposition: A | Discharge status: Home / Self Care | Payer: BC Managed Care – PPO | Source: Ambulatory Visit | Attending: Sports Medicine | Admitting: Sports Medicine

## 2012-11-14 ENCOUNTER — Ambulatory Visit (INDEPENDENT_AMBULATORY_CARE_PROVIDER_SITE_OTHER): Payer: BC Managed Care – PPO | Admitting: Sports Medicine

## 2012-11-14 VITALS — BP 112/72 | HR 94 | Ht 62.0 in | Wt 328.0 lb

## 2012-11-14 DIAGNOSIS — M79609 Pain in unspecified limb: Secondary | ICD-10-CM

## 2012-11-14 DIAGNOSIS — M79672 Pain in left foot: Secondary | ICD-10-CM

## 2012-11-14 MED ORDER — GABAPENTIN 300 MG PO CAPS: 300.0000 mg | ORAL_CAPSULE | Freq: Four times a day (QID) | ORAL | Status: DC

## 2012-11-14 NOTE — Progress Notes (Signed)
Pt aware that she is to take gabapentin 300 mg 1 tablet qhs, directions on rx are not correct.

## 2012-11-14 NOTE — Progress Notes (Signed)
  Subjective:    Patient ID: [name], female    DOB: 1951-06-11, 62 y.o.   MRN: [id number]  HPI Patient comes in today for followup on her left foot pain. She is still struggling with pain along the anterior aspect of the ankle/foot. He green sports insole cost too much discomfort along her arch so she has discontinued it. Although her pain is different than the pain she's had in the past, she does state that she's had a history of peroneal nerve irritation that responded well to Elavil. She's been taking Elavil at night in an attempt to help her pain but he has been ineffective. She's tried Lyrica and Neurontin in the past as well. She's not noticed any swelling. Pain is worse with activity and improve some at rest.    Review of Systems     Objective:   Physical Exam Obese, no acute distress  Left foot and ankle: There is diffuse tenderness to palpation along the anterior ankle and dorsum of the midfoot. Mild soft tissue swelling. No erythema. She has limited ankle dorsiflexion secondary to pain. She also has pain with full plantar flexion. She is neurovascularly intact distally. Negative Tinel's over the tarsal tunnel. Walking with an obvious limp.       Assessment & Plan:  1. 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 Obesity  We will try a simple arch strap for compression. She has some Voltaren gel at home and I recommended that she use it 2-3 times daily. She does have a history of atrial fibrillation and prior CVA but tells me that her cardiologist is aware of her use of topical anti-inflammatory. I would like to get an AP and lateral x-ray of her left foot and have her followup in 3-4 weeks. Although I do not believe this is necessarily neuropathic in nature, I do think we should change her Elavil to Neurontin 300 mg each bedtime. She will call with questions or concerns prior to her followup visit.

## 2012-11-20 ENCOUNTER — Ambulatory Visit: Payer: BC Managed Care – PPO | Admitting: Internal Medicine

## 2012-11-22 ENCOUNTER — Telehealth: Payer: Self-pay | Admitting: Internal Medicine

## 2012-11-22 MED ORDER — LEVOTHYROXINE SODIUM 75 MCG PO TABS: 75.0000 ug | ORAL_TABLET | Freq: Every day | ORAL | Status: DC

## 2012-11-22 NOTE — Telephone Encounter (Signed)
Pt needs refill on levothyroxine (SYNTHROID, LEVOTHROID) 75 MCG tablet. Pt has follow up appt on 3/31 so needs enough to get through til appt. [facility]

## 2012-11-23 ENCOUNTER — Telehealth: Payer: Self-pay | Admitting: *Deleted

## 2012-11-23 NOTE — Telephone Encounter (Signed)
Message copied by Ocie Bob on Fri Nov 23, 2012 10:40 AM ------      Message from: Lilia Argue R      Created: Fri Nov 23, 2012  9:07 AM      Regarding: RE: phone message      Contact: [phone]       Call and schedule the patient to see me sometime next week...ok to work her in on Monday if needed.            ----- Message -----         From: Ocie Bob, RN         Sent: 11/23/2012   8:54 AM           To: Thurman Coyer, DO      Subject: FW: phone message                                                    ----- Message -----         From: Carolyne Littles         Sent: 11/23/2012   8:32 AM           To: Ocie Bob, RN      Subject: phone message                                            Pt would like xray results on her foot, she has been in a lot of pain the past couple days, wants to know if a  cortisone injection would help her.       ------

## 2012-11-23 NOTE — Telephone Encounter (Signed)
Scheduled pt for appt with Dr. Micheline Chapman 11/26/12.

## 2012-11-26 ENCOUNTER — Ambulatory Visit (INDEPENDENT_AMBULATORY_CARE_PROVIDER_SITE_OTHER): Payer: BC Managed Care – PPO | Admitting: Sports Medicine

## 2012-11-26 VITALS — BP 139/75 | Ht 62.0 in | Wt 300.0 lb

## 2012-11-26 DIAGNOSIS — M25572 Pain in left ankle and joints of left foot: Secondary | ICD-10-CM

## 2012-11-26 DIAGNOSIS — M25579 Pain in unspecified ankle and joints of unspecified foot: Secondary | ICD-10-CM

## 2012-11-26 MED ORDER — BACLOFEN 10 MG PO TABS: 10.0000 mg | ORAL_TABLET | Freq: Every evening | ORAL | Status: DC | PRN

## 2012-11-26 NOTE — Progress Notes (Signed)
  Subjective:    Patient ID: [name], female    DOB: [birth year], 62 y.o.   MRN: [id number]  HPI  Patient comes in today with persistent left foot pain. Recent x-rays of her left foot showed no significant degenerative changes. She has diffusely dense bones which is secondary to osteopetrosis. She localizes most of her pain along the arch of the foot. She is experiencing spasms which are similar to some pain that she felt in this foot after a stroke. Baclofen was very helpful at that time. She gets swelling intermittently. Pain is present both at rest as well as with ambulation. She denies any significant pain in her ankle. The arch strap has been somewhat helpful.    Review of Systems     Objective:   Physical Exam Obese, no acute distress  Left foot: She is tender to palpation along the arch of the foot including at the navicular. No soft tissue swelling. Mild erythema but the skin is not warm to touch. No tenderness to palpation at the ankle. Neurovascularly intact distally. Walks with a limp in the assistance of a cane.       Assessment & Plan:  1. Persistent left foot pain questional etiology 2. Osteopetrosis  Since the patient's symptoms are similar in nature to what she has experienced previously, I have given her a prescription for baclofen 10 mg to take each bedtime. She has a followup appointment with me in 2 weeks but she will call me in 2-3 days to let me know how she is responding to the medicine. If pain persists, I will need to consider merits of further diagnostic imaging likely in the form of an MRI before deciding on further treatment. Since her pain is present at rest as well as up with walking, I don't think she would benefit from immobilization.

## 2012-11-28 ENCOUNTER — Ambulatory Visit (INDEPENDENT_AMBULATORY_CARE_PROVIDER_SITE_OTHER): Payer: BC Managed Care – PPO | Admitting: Nurse Practitioner

## 2012-11-28 ENCOUNTER — Encounter: Payer: Self-pay | Admitting: Nurse Practitioner

## 2012-11-28 VITALS — BP 115/57 | HR 72 | Ht 62.0 in | Wt 325.0 lb

## 2012-11-28 DIAGNOSIS — I639 Cerebral infarction, unspecified: Secondary | ICD-10-CM

## 2012-11-28 DIAGNOSIS — I6350 Cerebral infarction due to unspecified occlusion or stenosis of unspecified cerebral artery: Secondary | ICD-10-CM

## 2012-11-28 DIAGNOSIS — G478 Other sleep disorders: Secondary | ICD-10-CM

## 2012-11-28 NOTE — Progress Notes (Signed)
HPI:   Patient returns for followup after her last visit with Dr. Leonie Man 12/01/2011. 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 She has vascular risk factors of obesity hypertension and atrial fib she also has hyperlipidemia but recently Zocor discontinued. She also has a long history of chronic back pain from degenerative lumbar disease no recent falls, using a cane to ambulate. Can only ambulate short distances without tiring. She has minor bruising from xarelto. Her neuropathy panel was negative. She has no new complaints today.  ROS:   Neuropathy, Gait, SOB, Joint pain    Physical Exam General: well developed, morbidly obese female seated, in no evident distress Head: head normocephalic and atraumatic. Oropharynx benign Neck: supple with no carotid or supraclavicular bruits Cardiovascular:no murmur  Neurologic Exam Mental Status: Awake and fully alert. Oriented to place and time. Recent and remote memory intact. Attention span, concentration and fund of knowledge appropriate. Mood and affect appropriate. Speech and language normal Cranial Nerves: Fundi not visualized. Pupils equal, briskly reactive to light. Extraocular movements full without nystagmus. Visual fields full to confrontation. Hearing intact and symmetric to finger snap. Facial sensation intact. Face, tongue, palate move normally and symmetrically. Neck flexion and extension normal.  Motor: Normal bulk and tone. Normal strength in all tested extremity muscles. Sensory.: intact to touch and pinprick and vibratory in all extremities.  Coordination: Rapid alternating movements normal in all extremities. Finger-to-nose performed accurately bilaterally. Gait and Station: Arises from chair without difficulty. Stance is wide based. Unable to stand on either foot, unable to tandem. Uses a cane.  Reflexes: 1+ and  symmetric in upper ext, decreased in lower. Toes downgoing.     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 Vascular risk factors of obesity hypertension, hyperlipidemia and atrial fib.     PLAN: Continue Xarelto for secondary stroke prevention and atrial fibrillation. Aggressive control of blood pressure with systolic <378 LDL below 588 Portion control for weight loss, patient claims she is unable to exercise due to chronic back pain and bilateral knee pain. Followup yearly or sooner if symptoms arise.      Dennie Bible, GNP-BC APRN

## 2012-11-28 NOTE — Patient Instructions (Addendum)
Continue Xarelto for secondary stroke prevention and atrial fibrillation Aggressive risk factor control with blood pressure less than 225 systolic LDL goal below 834% The use of her cane at all times to prevent falls

## 2012-11-29 ENCOUNTER — Telehealth: Payer: Self-pay | Admitting: Internal Medicine

## 2012-11-29 MED ORDER — ATENOLOL 50 MG PO TABS: ORAL_TABLET | ORAL | Status: DC

## 2012-11-29 NOTE — Telephone Encounter (Signed)
Sent by e-scribe. 

## 2012-11-29 NOTE — Telephone Encounter (Signed)
Pt needs new rx atenolol 50 mg #30. [facility] [address]. Pt has appt on monday

## 2012-11-30 ENCOUNTER — Telehealth: Payer: Self-pay | Admitting: Sports Medicine

## 2012-11-30 NOTE — Telephone Encounter (Signed)
I spoke with [name] on the phone. The baclofen has helped the spasm in her foot but she still having some pain in the dorsum of her foot and ankle as well as some intermittent ankle swelling. I've asked that she give the baclofen a couple of more days and call me first name Monday morning, March 31st. If her improvement plateaus I would consider further diagnostic imaging in the form of an MRI scan.

## 2012-11-30 NOTE — Telephone Encounter (Signed)
Message copied by Thurman Coyer on Fri Nov 30, 2012 11:07 AM ------      Message from: Tobin Chad L      Created: Thu Nov 29, 2012 11:24 AM      Regarding: [id number]       Contact: [phone]       Called to let you know that baclofen stopped the stabbing pain, but still has pain on top of foot/ankle. She also has swelling.  ------

## 2012-12-03 ENCOUNTER — Ambulatory Visit: Payer: Self-pay | Admitting: Internal Medicine

## 2012-12-04 ENCOUNTER — Telehealth: Payer: Self-pay | Admitting: Sports Medicine

## 2012-12-04 ENCOUNTER — Encounter: Payer: Self-pay | Admitting: *Deleted

## 2012-12-04 DIAGNOSIS — M25572 Pain in left ankle and joints of left foot: Secondary | ICD-10-CM

## 2012-12-04 NOTE — Telephone Encounter (Deleted)
I spoke with [name] about her ongoing

## 2012-12-04 NOTE — Telephone Encounter (Signed)
Spoke with [name] on the phone regarding her ongoing left ankle pain. She still getting tremendous pain and swelling. I want to order an MRI scan of her left ankle at this time to better evaluate for soft tissue pathology. She may also have an element of RSD here. I've asked her to try to come in to the office for reevaluation sometime in the next day or 2. I would like to consider putting her in a walking boot until I can get the MRI.

## 2012-12-06 ENCOUNTER — Ambulatory Visit
Admission: RE | Admit: 2012-12-06 | Discharge: 2012-12-06 | Disposition: A | Discharge status: Home / Self Care | Payer: BC Managed Care – PPO | Source: Ambulatory Visit | Attending: Sports Medicine | Admitting: Sports Medicine

## 2012-12-06 ENCOUNTER — Telehealth: Payer: Self-pay | Admitting: *Deleted

## 2012-12-06 DIAGNOSIS — M25572 Pain in left ankle and joints of left foot: Secondary | ICD-10-CM

## 2012-12-06 NOTE — Telephone Encounter (Signed)
Message copied by Hassell Done, AMY C on Thu Dec 06, 2012 10:45 AM ------      Message from: Lilia Argue R      Created: Thu Dec 06, 2012 10:25 AM      Regarding: referal       Please refer to Dr Sharol Given for ankle ocd with loose body            ----- Message -----         From: Rad Results In Interface         Sent: 12/06/2012  10:18 AM           To: Thurman Coyer, DO                   ------

## 2012-12-06 NOTE — Telephone Encounter (Signed)
Scheduled pt for appt with Dr. Sharol Given 12/14/12 @ 8:45am.  Pt notified of appt info. Faxed records to [phone].

## 2012-12-07 ENCOUNTER — Telehealth: Payer: Self-pay | Admitting: Sports Medicine

## 2012-12-07 ENCOUNTER — Telehealth: Payer: Self-pay | Admitting: *Deleted

## 2012-12-07 NOTE — Telephone Encounter (Signed)
Pt states she is struggling with her ankle pain, last night was hard to sleep. Called Dr. Jess Barters office to see if they had a cancellation and could see her sooner.  They scheduled her for 12/10/12 @ 945 am.  Pt has some percocet from a previous knee surgery.  Suggested that she try taking 1 percocet at bedtime to help her pain so she can rest.  Pt agreeable.

## 2012-12-07 NOTE — Telephone Encounter (Signed)
I spoke with [name] on the phone yesterday regarding MRI findings of her left ankle. 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 She also has moderate tenosynovitis in the peroneal tendons as well as some midfoot degenerative changes. In addition she has Achilles tendinopathy and nonspecific subcutaneous edema and soft tissue swelling in the anterior lateral ankle. Also of note, she has myositis and fatty atrophy involving the foot and ankle musculature. There is no significant joint effusion. Patient continues to suffer with ankle pain and disability. I'm unsure what her pain generator is and I think she would be best served by evaluation by Dr.Duda. Fortunately, we were able to get her an appointment with him on Monday, April 7th. We did offer her a Cam Walker in the interim but [name] would like to wait until her appointment with Dr. Sharol Given. At this point I would defer further treatment and workup to the discretion of Dr. Sharol Given.

## 2012-12-12 ENCOUNTER — Other Ambulatory Visit: Payer: Self-pay | Admitting: Internal Medicine

## 2012-12-13 ENCOUNTER — Ambulatory Visit: Payer: Self-pay | Admitting: Sports Medicine

## 2012-12-17 ENCOUNTER — Telehealth: Payer: Self-pay | Admitting: Neurology

## 2012-12-17 NOTE — Telephone Encounter (Signed)
Pt called and is letting me know that her disbility form will be coming to be filled out.   She saw C. Martin NP on 11-26-12 and wanted to relay that her foot problem is from her stroke (tendon problem) per Dr. Sharol Given from her referral from her pcp Dr. Micheline Chapman.  She wants to make sure this information is in the form.  She is not walking well, using a cane.   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 I told her Dr. Leonie Man out and needs to sign form.  Will call when complete.

## 2012-12-18 NOTE — Telephone Encounter (Signed)
I actually saw her on 11/28/12. She did not mention foot pain in ROS.See documentation note.

## 2012-12-19 ENCOUNTER — Encounter: Payer: Self-pay | Admitting: Internal Medicine

## 2012-12-19 ENCOUNTER — Ambulatory Visit (INDEPENDENT_AMBULATORY_CARE_PROVIDER_SITE_OTHER): Payer: BC Managed Care – PPO | Admitting: Internal Medicine

## 2012-12-19 VITALS — BP 118/70 | HR 91 | Temp 98.5°F

## 2012-12-19 DIAGNOSIS — R7303 Prediabetes: Secondary | ICD-10-CM

## 2012-12-19 DIAGNOSIS — I10 Essential (primary) hypertension: Secondary | ICD-10-CM

## 2012-12-19 DIAGNOSIS — D518 Other vitamin B12 deficiency anemias: Secondary | ICD-10-CM

## 2012-12-19 DIAGNOSIS — Z7901 Long term (current) use of anticoagulants: Secondary | ICD-10-CM

## 2012-12-19 DIAGNOSIS — M7750 Other enthesopathy of unspecified foot: Secondary | ICD-10-CM

## 2012-12-19 DIAGNOSIS — R7309 Other abnormal glucose: Secondary | ICD-10-CM

## 2012-12-19 DIAGNOSIS — R799 Abnormal finding of blood chemistry, unspecified: Secondary | ICD-10-CM

## 2012-12-19 DIAGNOSIS — R945 Abnormal results of liver function studies: Secondary | ICD-10-CM

## 2012-12-19 DIAGNOSIS — I4891 Unspecified atrial fibrillation: Secondary | ICD-10-CM

## 2012-12-19 DIAGNOSIS — M659 Synovitis and tenosynovitis, unspecified: Secondary | ICD-10-CM

## 2012-12-19 DIAGNOSIS — R7989 Other specified abnormal findings of blood chemistry: Secondary | ICD-10-CM

## 2012-12-19 LAB — CBC WITH DIFFERENTIAL/PLATELET
Eosinophils Relative: 0.1 % (ref 0.0–5.0)
HCT: 36.8 % (ref 36.0–46.0)
Hemoglobin: 11.8 g/dL — ABNORMAL LOW (ref 12.0–15.0)
Lymphs Abs: 0.7 10*3/uL (ref 0.7–4.0)
Monocytes Relative: 4.1 % (ref 3.0–12.0)
Neutro Abs: 10 10*3/uL — ABNORMAL HIGH (ref 1.4–7.7)
RDW: 18.7 % — ABNORMAL HIGH (ref 11.5–14.6)
WBC: 11.2 10*3/uL — ABNORMAL HIGH (ref 4.5–10.5)

## 2012-12-19 LAB — BASIC METABOLIC PANEL
GFR: 39.35 mL/min — ABNORMAL LOW (ref >60.00)
Glucose, Bld: 143 mg/dL — ABNORMAL HIGH (ref 70–99)
Potassium: 4.1 mEq/L (ref 3.5–5.1)
Sodium: 138 mEq/L (ref 135–145)

## 2012-12-19 LAB — HEPATIC FUNCTION PANEL
ALT: 16 U/L (ref 0–35)
AST: 29 U/L (ref 0–37)
Albumin: 3.6 g/dL (ref 3.5–5.2)
Alkaline Phosphatase: 78 U/L (ref 39–117)
Total Protein: 7.6 g/dL (ref 6.0–8.3)

## 2012-12-19 LAB — LIPID PANEL: Total CHOL/HDL Ratio: 4

## 2012-12-19 LAB — VITAMIN B12: Vitamin B-12: 466 pg/mL (ref 211–911)

## 2012-12-19 NOTE — Patient Instructions (Addendum)
Will notify you  of labs when available. Continue lifestyle intervention healthy eating and exercise . ROV  6 months  We may add back  Simvastatin and lab follow up.     Consider trying   Sublingual  b12 levels.

## 2012-12-19 NOTE — Progress Notes (Signed)
Chief Complaint  Patient presents with  . Follow-up    Pt is fasting other than half a piece of gum.    HPI: Patient comes in today for follow up of  multiple medical problems.  Had   [employer] and now on an aca product.  Pre medicare  Battling  Left foot tendinitis  Had steroid injection left ankle and now on prednisone .   12 day taper  Per Dr Sharol Given   And hard  To walk so now in soft boot steroid helpful at present  Walking with walker   But cane today trying to get back to  More independent walking takes tylenol not a lot of help  Also back pain as before  Saw  Dr Calvert Cantor on yearly check doing well has af on xeralto  Had ekg rate  AF controlled   Consider restaert statin if lfts ok  Saw Dr Leonie Man  And pa  Yearly  For now.  For sp cva    Sleep is better  At this point.    No b12 shots since last visit .  Shortage  No new numbness   Off cymbalta  For a while as well as simva   fam hx of se with lipitor in mom   Having a hard time losing weight.  Following was her last assessment : SOB much improved symptoms consistent with fluid overload her last creatinine was 1.7  chronic AF  Obesity  Pre diabetes  LFTS coincident with above scenario consider medication effect versus congestion of the liver needs repeat liver tests today  Enlarged heart on ct apparently stable cardiac status at present she did have a slightly elevated BNP initially.  Edema much better is still on one Lasix a day.  Abnormal chest x-ray CT scan was ordered but not done is reasonable to just repeat the chest x-ray to see if the abnormality is gone.  Esophageal laryngeal reflux followed by Dr. Redmond Baseman  Side effect of losartan and ACE inhibitors. Avoiding them and now.  B12 deficiency injection today  She will be losing her insurance before she gets on Medicare she is stable and not in followup in about 4 months call the meantime for refills if needed  ROS: See pertinent positives and negatives per HPI. No  current falling vision changes new cp sob     Past Medical History  Diagnosis Date  . Hypertension   . Hypothyroid   . Obesity   . Post-menopausal bleeding   . Anemia   . Osteoporosis   . B12 deficiency   . CVA (cerebral infarction) 2 19 2012     r frontal  thrombotic    . LVH (left ventricular hypertrophy)      by echo 2012  . Atrial fibrillation   . Stroke     10/2010  . Left leg weakness     r/t stroke 10/2010  . Sleep apnea     no cpap - surgery to removed tonsils/cut down uvula  . Blood transfusion     at pre-op appt 10/2, per pt, no hx of bld transfusion  . Arthritis     kness, back, shoulders - no meds  . Hyperlipidemia   . Diverticulosis   . Colon polyps   . Osteopetrosis   . Dyslipidemia   . Neuromuscular disorder     Family History  Problem Relation Age of Onset  . COPD Mother   . Hypertension Mother   .  Osteoporosis Mother   . Diabetes Father   . Hypertension Father   . Liver cancer Father   . Hypertension Sister   . Hypertension Brother     History   Social History  . Marital Status: Divorced    Spouse Name: N/A    Number of Children: N/A  . Years of Education: N/A   Social History Main Topics  . Smoking status: Never Smoker   . Smokeless tobacco: Never Used  . Alcohol Use: 0.6 oz/week    1 Glasses of wine per week     Comment: socially - every three or four months  . Drug Use: No  . Sexually Active: Not Currently   Other Topics Concern  . 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 atenolol (TENORMIN) 50 MG tablet Take 1 in am and 1/2 at night  45 tablet  0  . diclofenac sodium (VOLTAREN) 1 % GEL Apply topically.        . ergocalciferol (VITAMIN D2) 50000 UNITS capsule Take 50,000 Units by mouth once a week.        . fish oil-omega-3 fatty acids  1000 MG capsule Take 2 g by mouth daily.        . furosemide (LASIX) 20 MG tablet TAKE 1 TABLET BY MOUTH EVERY DAY  30 tablet  5  . gabapentin (NEURONTIN) 300 MG capsule Take 300 mg by mouth daily.      Marland Kitchen levothyroxine (SYNTHROID, LEVOTHROID) 75 MCG tablet Take 1 tablet (75 mcg total) by mouth daily.  90 tablet  0  . lisinopril-hydrochlorothiazide (PRINZIDE,ZESTORETIC) 20-25 MG per tablet TAKE 1 TABLET BY MOUTH ONCE DAILY  30 tablet  0  . Rivaroxaban (XARELTO PO) Take by mouth.        . [DISCONTINUED] baclofen (LIORESAL) 10 MG tablet Take 1 tablet (10 mg total) by mouth at bedtime as needed.  30 each  0  . [DISCONTINUED] ranitidine (ZANTAC) 150 MG capsule Take 1 capsule (150 mg total) by mouth 2 (two) times daily.  60 capsule  1   Facility-Administered Encounter Medications as of 12/19/2012  Medication Dose Route Frequency Provider Last Rate Last Dose  . cyanocobalamin ((VITAMIN B-12)) injection 1,000 mcg  1,000 mcg Intramuscular Once [name], MD      . [DISCONTINUED] albuterol (ACCUNEB) nebulizer solution 1.25 mg  1.25 mg Nebulization Once [name], MD        EXAM:  BP 118/70  Pulse 91  Temp(Src) 98.5 F (36.9 C) (Oral)  SpO2 97%  Body mass index is 0.00 kg/(m^2).  GENERAL: vitals reviewed and listed above, alert, oriented, appears well hydrated and in no acute distress Walks slowly with cane left foot in sift boot  HEENT: atraumatic, conjunctiva  clear, no obvious abnormalities on inspection of external nose and ears OP : no lesion edema or exudate   NECK: no obvious masses on inspection palpation  No bruits heard  LUNGS: clear to auscultation bilaterally, no wheezes, rales or rhonchi, good air movement CV: HIRRR, no clubbing cyanosis nl cap refill  No jvd  2/6 sem lusb no radiation  MS: moves all extremities antalgic gait hobbles slowly   Good cane support  Skin no bleeding bruising  Nl  color PSYCH: pleasant and cooperative, no obvious depression or  anxiety  Reviewed   Dr berry visit  Note 3 14  Lab Results  Component Value Date   WBC 7.7 06/07/2011   HGB 12.4 06/07/2011   HCT 38.6 06/07/2011   PLT 202 06/07/2011   GLUCOSE 99 06/19/2012   CHOL 120 05/09/2012   TRIG 88.0 05/09/2012   HDL 46.20 05/09/2012   LDLDIRECT 139.4 06/30/2010   LDLCALC 56 05/09/2012   ALT 45* 06/19/2012   AST 59* 06/19/2012   NA 138 06/19/2012   K 4.7 06/19/2012   CL 97 06/19/2012   CREATININE 1.4* 06/19/2012   BUN 23 06/19/2012   CO2 28 06/19/2012   TSH 2.058 10/26/2010   INR 1.04 03/18/2011   HGBA1C 6.4 05/09/2012   MICROALBUR 0.2 07/09/2009    ASSESSMENT AND PLAN:  Discussed the following assessment and plan:  Prediabetes - lab today may elevated sugar from steroid rx  - Plan: Basic metabolic panel, CBC with Differential, Hemoglobin A1c, Hepatic function panel, TSH, Lipid panel, Vitamin B12  HYPERTENSION - Plan: Basic metabolic panel, CBC with Differential, Hemoglobin A1c, Hepatic function panel, TSH, Lipid panel, Vitamin B12  Abnormal LFTs - now off simva and cymbalta has had some tylenol recently - Plan: Basic metabolic panel, CBC with Differential, Hemoglobin A1c, Hepatic function panel, TSH, Lipid panel, Vitamin B12  Chronic anticoagulation - Plan: Basic metabolic panel, CBC with Differential, Hemoglobin A1c, Hepatic function panel, TSH, Lipid panel, Vitamin B12  Elevated serum creatinine - Plan: Basic metabolic panel, CBC with Differential, Hemoglobin A1c, Hepatic function panel, TSH, Lipid panel, Vitamin B12  ANEMIA, VITAMIN B12 DEFICIENCY NEC - shortage of injectable na  can try otc sl - Plan: Basic metabolic panel, CBC with Differential, Hemoglobin A1c, Hepatic function panel, TSH, Lipid panel, Vitamin B12  Foot tendinitis - left under specialty care recent steroid rx   A-fib  -Patient advised to return or notify health care team  if symptoms worsen or persist or new concerns arise.  Patient Instructions  Will notify you  of labs when  available. Continue lifestyle intervention healthy eating and exercise . ROV  6 months  We may add back  Simvastatin and lab follow up.     Consider trying   Sublingual  b12 levels.      Standley Brooking. Panosh M.D.

## 2012-12-20 ENCOUNTER — Encounter: Payer: Self-pay | Admitting: Internal Medicine

## 2012-12-25 ENCOUNTER — Telehealth: Payer: Self-pay | Admitting: Internal Medicine

## 2012-12-25 NOTE — Telephone Encounter (Signed)
Left message on home/cell for the pt to return my call.

## 2012-12-25 NOTE — Telephone Encounter (Signed)
Caller: [name]/Patient; Phone: [phone]; Reason for Call: Attempted to reach patient regarding prescription question at number given; no answer.  Message left on identified voicemail to call office for assistance.  Krs/can

## 2012-12-25 NOTE — Telephone Encounter (Signed)
Caller: Allex/Patient; Phone: [phone]; Reason for Call: Pt calling to speak w/Misty for results of recent testing and medication issues.  Please call her at your earliest convenience.

## 2012-12-26 ENCOUNTER — Other Ambulatory Visit: Payer: Self-pay | Admitting: Family Medicine

## 2012-12-26 DIAGNOSIS — E785 Hyperlipidemia, unspecified: Secondary | ICD-10-CM

## 2012-12-26 MED ORDER — SIMVASTATIN 20 MG PO TABS: 20.0000 mg | ORAL_TABLET | Freq: Every evening | ORAL | Status: DC

## 2012-12-26 MED ORDER — LISINOPRIL-HYDROCHLOROTHIAZIDE 20-25 MG PO TABS: ORAL_TABLET | ORAL | Status: DC

## 2012-12-26 MED ORDER — LEVOTHYROXINE SODIUM 75 MCG PO TABS: 75.0000 ug | ORAL_TABLET | Freq: Every day | ORAL | Status: DC

## 2012-12-26 MED ORDER — FUROSEMIDE 20 MG PO TABS: ORAL_TABLET | ORAL | Status: DC

## 2012-12-26 MED ORDER — ATENOLOL 50 MG PO TABS: ORAL_TABLET | ORAL | Status: DC

## 2012-12-26 NOTE — Telephone Encounter (Signed)
Pt called again, Would like for you to call at your convenience.

## 2012-12-26 NOTE — Telephone Encounter (Signed)
Patient notified of results by telephone.

## 2012-12-31 ENCOUNTER — Telehealth: Payer: Self-pay | Admitting: Internal Medicine

## 2012-12-31 ENCOUNTER — Encounter: Payer: Self-pay | Admitting: Internal Medicine

## 2012-12-31 ENCOUNTER — Ambulatory Visit (INDEPENDENT_AMBULATORY_CARE_PROVIDER_SITE_OTHER): Payer: BC Managed Care – PPO | Admitting: Internal Medicine

## 2012-12-31 VITALS — BP 104/60 | HR 88 | Temp 98.1°F

## 2012-12-31 DIAGNOSIS — Z7901 Long term (current) use of anticoagulants: Secondary | ICD-10-CM

## 2012-12-31 DIAGNOSIS — R109 Unspecified abdominal pain: Secondary | ICD-10-CM

## 2012-12-31 NOTE — Progress Notes (Signed)
Chief Complaint  Patient presents with  . Back Pain    Pain started on Saturday morning.  She has treated with Tylenol.  . Flank Pain    HPI: Patient comes in today for SDA for  new problem evaluation.  Onset with back hurting 2 days ago at night described as sharp in the mid upper back coming around to the right side under the breast day ago and then laying down made it worse had spasms on right  And felt like  Knife felt some better when sitting up or voiding the stretching position. better sitting up.  Then yesterday still hurt at times got a bit scared did call her sister she called the: stable today who advised revaluation she actually feels better right now .  Right upper abd and chest  and there is no associated shortness of breath vomiting or severe nausea change in bowel habits hematuria or UTI symptoms she also has no fever history of falling or pleurisy. Took some Tylenol also half of the muscle relaxant unknown if it helped ;is better right now ROS: See pertinent positives and negatives per HPI.  Past Medical History  Diagnosis Date  . Hypertension   . Hypothyroid   . Obesity   . Post-menopausal bleeding   . Anemia   . Osteoporosis   . B12 deficiency   . CVA (cerebral infarction) 2 19 2012     r frontal  thrombotic    . LVH (left ventricular hypertrophy)      by echo 2012  . Atrial fibrillation   . Stroke     10/2010  . Left leg weakness     r/t stroke 10/2010  . Sleep apnea     no cpap - surgery to removed tonsils/cut down uvula  . Blood transfusion     at pre-op appt 10/2, per pt, no hx of bld transfusion  . Arthritis     kness, back, shoulders - no meds  . Hyperlipidemia   . Diverticulosis   . Colon polyps   . Osteopetrosis   . Dyslipidemia   . Neuromuscular disorder     Family History  Problem Relation Age of Onset  . COPD Mother   . Hypertension Mother   . Osteoporosis Mother   . Diabetes Father   . Hypertension Father   . Liver cancer Father   .  Hypertension Sister   . Hypertension Brother     History   Social History  . Marital Status: Divorced    Spouse Name: N/A    Number of Children: N/A  . Years of Education: N/A   Social History Main Topics  . Smoking status: Never Smoker   . Smokeless tobacco: Never Used  . Alcohol Use: 0.6 oz/week    1 Glasses of wine per week     Comment: socially - every three or four months  . Drug Use: No  . Sexually Active: Not Currently   Other Topics Concern  . 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 atenolol (TENORMIN) 50 MG tablet Take 1 in am and 1/2 at night  135 tablet  1  . diclofenac sodium (VOLTAREN) 1 % GEL Apply topically.        Marland Kitchen  ergocalciferol (VITAMIN D2) 50000 UNITS capsule Take 50,000 Units by mouth once a week.        . fish oil-omega-3 fatty acids 1000 MG capsule Take 2 g by mouth daily.        . furosemide (LASIX) 20 MG tablet TAKE 1 TABLET BY MOUTH EVERY DAY  90 tablet  1  . gabapentin (NEURONTIN) 300 MG capsule Take 300 mg by mouth daily.      Marland Kitchen levothyroxine (SYNTHROID, LEVOTHROID) 75 MCG tablet Take 1 tablet (75 mcg total) by mouth daily.  90 tablet  1  . lisinopril-hydrochlorothiazide (PRINZIDE,ZESTORETIC) 20-25 MG per tablet TAKE 1 TABLET BY MOUTH ONCE DAILY  90 tablet  1  . Rivaroxaban (XARELTO PO) Take by mouth.       . simvastatin (ZOCOR) 20 MG tablet Take 1 tablet (20 mg total) by mouth every evening.  90 tablet  1   Facility-Administered Encounter Medications as of 12/31/2012  Medication Dose Route Frequency Provider Last Rate Last Dose  . cyanocobalamin ((VITAMIN B-12)) injection 1,000 mcg  1,000 mcg Intramuscular Once [name], MD        EXAM:  BP 104/60  Pulse 88  Temp(Src) 98.1 F (36.7 C) (Oral)  SpO2 98%  Body mass index is 0.00  kg/(m^2).  GENERAL: vitals reviewed and listed above, alert, oriented, appears well hydrated and in no acute distress  HEENT: atraumatic, conjunctiva  clear, no obvious abnormalities on inspection of external nose and ears  NECK: no obvious masses on inspection palpation   LUNGS: clear to auscultation bilaterally, no wheezes, rales or rhonchi, good air movement  CV: HRRR, no clubbing cyanosisnl cap refill   MS: moves all extremities  walking with a cane easily palpation of back right upper quadrant rib cage shows no focal tenderness difficult exam is unable to get up onto the table. Skin reveals no rashes. She looks comfortable at this time.  PSYCH: pleasant and cooperative, no obvious depression or anxiety Lab Results  Component Value Date   WBC 11.2* 12/19/2012   HGB 11.8* 12/19/2012   HCT 36.8 12/19/2012   PLT 298.0 12/19/2012   GLUCOSE 143* 12/19/2012   CHOL 178 12/19/2012   TRIG 116.0 12/19/2012   HDL 41.10 12/19/2012   LDLDIRECT 139.4 06/30/2010   LDLCALC 114* 12/19/2012   ALT 16 12/19/2012   AST 29 12/19/2012   NA 138 12/19/2012   K 4.1 12/19/2012   CL 102 12/19/2012   CREATININE 1.4* 12/19/2012   BUN 44* 12/19/2012   CO2 22 12/19/2012   TSH 0.63 12/19/2012   INR 1.04 03/18/2011   HGBA1C 6.2 12/19/2012   MICROALBUR 0.2 07/09/2009    ASSESSMENT AND PLAN:  Discussed the following assessment and plan:  Side pain - Seems like a musculoskeletal or a neuritis pain is it is positional without associated symptoms  Chronic anticoagulation Discussed differential diagnosis fortunately no other alarm symptoms. Pulse ox is good today. Expectant management Tylenol as needed followup persistent and progressive at this time we discussed other tests ultrasounds etc. but I think it is safe to wait and observe. At this time.   She is on chronic anticoagulation for her intermittent A. fib. And her history of stroke -Patient advised to return or notify health care team  if symptoms worsen or persist or  new concerns arise.  Patient Instructions  This could  Be chest wall   Pinched nerve kind of pain and tylenol  Is ok.    Exam is good otherwise .  Contact us if  persistent or progressive .  Or fever  Change in  Breathing and other issues.   Or the on call service   Nursing also .    Standley Brooking. Panosh M.D.

## 2012-12-31 NOTE — Telephone Encounter (Signed)
Patient Information:  Caller Name: Treesa  Phone: [phone]  Patient: [name], [name]  Gender: Female  DOB: 12-01-1950  Age: 62 Years  PCP: [name] (Family Practice)  Office Follow Up:  Does the office need to follow up with this patient?: No  Instructions For The Office: N/A   Symptoms  Reason For Call & Symptoms: Pt is calling and states that on 12/29/12 started having back pain around back of bra strap and to the right mid back area;  pain is located under right breast as well; took Tylenol with no relief; pain worsens if lays down; if she sits up the pain eases; hard time sleeping last night; pt feels that this is gallbladder pain; pt states that she can not afford OV and would rather just go where she needs to get dx; denies any abdominal pain; having 2-3 soft stools per day with alot of gas; denies diarrhea; rates pain 3-4 with movement but worsens if she moves a certain way;  Reviewed Health History In EMR: Yes  Reviewed Medications In EMR: Yes  Reviewed Allergies In EMR: Yes  Reviewed Surgeries / Procedures: Yes  Date of Onset of Symptoms: 12/29/2012  Treatments Tried: Tylenol  Treatments Tried Worked: No  Guideline(s) Used:  Back Pain  Abdominal Pain - Female  Disposition Per Guideline:   See Today in Office  Reason For Disposition Reached:   Age > 60 years  Advice Given:  Call Back If:  You become worse.  Patient Will Follow Care Advice:  YES  Appointment Scheduled:  12/31/2012 15:00:00 Appointment Scheduled Provider:  [name] [facility])

## 2012-12-31 NOTE — Patient Instructions (Signed)
This could  Be chest wall   Pinched nerve kind of pain and tylenol  Is ok.    Exam is good otherwise .    Contact us if  persistent or progressive .  Or fever  Change in  Breathing and other issues.   Or the on call service   Nursing also .

## 2013-01-01 ENCOUNTER — Other Ambulatory Visit: Payer: Self-pay | Admitting: Internal Medicine

## 2013-01-01 DIAGNOSIS — Z0289 Encounter for other administrative examinations: Secondary | ICD-10-CM

## 2013-01-23 ENCOUNTER — Encounter: Payer: Self-pay | Admitting: Sports Medicine

## 2013-01-23 ENCOUNTER — Ambulatory Visit (INDEPENDENT_AMBULATORY_CARE_PROVIDER_SITE_OTHER): Payer: BC Managed Care – PPO | Admitting: Sports Medicine

## 2013-01-23 VITALS — BP 133/75 | Ht 62.0 in | Wt 328.0 lb

## 2013-01-23 DIAGNOSIS — M84374A Stress fracture, right foot, initial encounter for fracture: Secondary | ICD-10-CM

## 2013-01-23 DIAGNOSIS — M84376A Stress fracture, unspecified foot, initial encounter for fracture: Secondary | ICD-10-CM

## 2013-01-23 NOTE — Assessment & Plan Note (Signed)
Recurrent metatarsal stress fracture of the 4th metatarsal bone. Patient was put in a postop shoe, encourage weight loss continue taking vitamin D supplementation. Patient will use Tylenol and her hydrocodone that she has at home already. Patient will followup again in 3 weeks. At that time we'll rescan her foot may contribute that we do see proper healing. Patient encouraged also do icing, elevation and compression that could be helpful.

## 2013-01-23 NOTE — Progress Notes (Addendum)
Chief complaint: Right foot pain  History of present illness: Patient is a 62 year old morbidly obese female who is coming in with a two-day history of severe right foot pain. Patient does not remember any significant injury. Patient does have a past medical history significant for a stress fracture over one year ago on his foot. Patient did have a fourth metatarsal stress fracture. Patient states that this pain is very similar to that presentation. Patient states that she is having a dull aching and throbbing sensation over the lateral aspect of her foot and on the dorsal aspect of the foot. Patient denies any numbness. Patient finds it very difficult to bear weight at this time. Patient did have a ankle compression sleeve that she put on this foot but did make minimal improvement. Patient did take a hydrocodone that she had at home which did help as well. Patient denies any fevers or chills.  Past medical history, social, surgical and family history all reviewed.   Physical exam Blood pressure 133/75, height 5' 2" (1.575 m), weight 328 lb (148.78 kg). General: No apparent distress alert and oriented x3 mood and affect normal, morbidly obese.  Respiratory: Patient's speak in full sentences and does not appear short of breath Skin: Warm dry intact with no signs of infection or rash Neuro: Cranial nerves II through XII are intact, neurovascularly intact in all extremities with 2+ DTRs and 2+ pulses. Right foot exam: Patient does have significant soft tissue swelling of the dorsal aspect of the foot. Patient is tender to palpation over the fourth metatarsal within the proximal third of the bone. She is neurovascularly intact distally. Patient is nontender over the medial or lateral malleolus or over-the-talar dome.  Musculoskeletal ultrasound was performed and interpreted by me today. Patient's ultrasound does show that she has edema over the proximal aspect of the fourth metatarsal. There is no true  cortical defect noted. There is no callus formation noted. Patient does have hypoechoic changes in this area as well as significant soft tissue swelling on the entire dorsal aspect of the foot. Patient does have neovascularization in this area.

## 2013-01-23 NOTE — Patient Instructions (Signed)
Good to see you I think you have another stress fracture.   You need to wear the boot at all times when weightbearing for the next 3 weeks.  Tylenol or hydrocodone for pain.  Ice and compression will help with the swelling.  Elevation can help as well.  Come back in 3 weeks and we will scan your foot again.

## 2013-01-24 ENCOUNTER — Telehealth: Payer: Self-pay | Admitting: Internal Medicine

## 2013-01-24 NOTE — Telephone Encounter (Signed)
Pt would like like for you to call her. MD will be receiving paperwork about pt's disibility, Pt would like to go over a few things with you about this paperwork. Pls call

## 2013-01-25 ENCOUNTER — Telehealth: Payer: Self-pay | Admitting: Sports Medicine

## 2013-01-25 ENCOUNTER — Other Ambulatory Visit: Payer: Self-pay | Admitting: *Deleted

## 2013-01-25 MED ORDER — HYDROCODONE-ACETAMINOPHEN 5-325 MG PO TABS: 1.0000 | ORAL_TABLET | Freq: Three times a day (TID) | ORAL | Status: DC | PRN

## 2013-01-25 NOTE — Telephone Encounter (Signed)
I spoke with [name] on the phone today. She still having quite a bit of pain in her foot. I recommended that she rest over the weekend. He will continue with compression and elevation. I've agreed to call her in a few Vicodin and she will touch base with me again early next week.

## 2013-01-30 NOTE — Telephone Encounter (Signed)
Spoke to the pt and informed her that I have been waiting on the paperwork.  She informed me that [employer] may or may not send the paperwork depending on information that was sent from her other physicians.  Given the length of time, I have decided to close this note.  Instructed her to call me if she hears from [employer] or they contact her about paperwork.

## 2013-02-09 ENCOUNTER — Other Ambulatory Visit: Payer: Self-pay | Admitting: Internal Medicine

## 2013-02-11 ENCOUNTER — Ambulatory Visit (INDEPENDENT_AMBULATORY_CARE_PROVIDER_SITE_OTHER): Payer: BC Managed Care – PPO | Admitting: Sports Medicine

## 2013-02-11 ENCOUNTER — Ambulatory Visit
Admission: RE | Admit: 2013-02-11 | Discharge: 2013-02-11 | Disposition: A | Discharge status: Home / Self Care | Payer: BC Managed Care – PPO | Source: Ambulatory Visit | Attending: Sports Medicine | Admitting: Sports Medicine

## 2013-02-11 VITALS — BP 105/65 | Ht 62.0 in | Wt 328.0 lb

## 2013-02-11 DIAGNOSIS — M79671 Pain in right foot: Secondary | ICD-10-CM

## 2013-02-11 DIAGNOSIS — M79609 Pain in unspecified limb: Secondary | ICD-10-CM

## 2013-02-12 NOTE — Progress Notes (Signed)
  Subjective:    Patient ID: [name], female    DOB: 06-Oct-1950, 62 y.o.   MRN: [id number]  HPI Patient comes in today for followup on her right foot pain. I had to call her in a few Vicodin after her last office visit. Overall, her pain has improved but she has persistent swelling in the dorsum of her foot. She is tolerating her postop shoe. She comes in today in a wheelchair. Ultrasound examination at her last visit suggested a possible fourth metatarsal stress fracture.    Review of Systems     Objective:   Physical Exam Obese, no acute distress  Right foot: There is diffuse soft tissue swelling throughout the dorsum of the foot. Non-erythematous. Normal temperature. There is mild tenderness to palpation along the mid shaft of the third and fourth metatarsals pain with metatarsal squeeze. Neurovascularly intact distally. Difficulty bearing weight due to her pain.  [facility] ultrasound of the right foot: Images in both long and short view were obtained. There is a digestive area of cortical irregularity in the proximal fourth metatarsal. This is best seen on the [facility] and does not have any associated neovascularity.       Assessment & Plan:  1. Persistent right foot pain secondary to stress reaction versus stress fracture  The ultrasound is inconclusive. Clinically,I think the patient is likely dealing with a metatarsal stress reaction. I did send her for plain x-rays of her foot including AP, lateral, and oblique views. There is no evidence of healing stress fracture or any other acute abnormality. There are degenerative changes in the midfoot. My suggestion is for the patient to continue in her postop shoe for 3 additional weeks and followup with me at the end of that time. For the swelling I recommended elevation and compression as much is possible. Hopefully at followup we can wean her from her postop shoe.

## 2013-02-27 ENCOUNTER — Other Ambulatory Visit: Payer: Self-pay | Admitting: *Deleted

## 2013-02-27 MED ORDER — HYDROCODONE-ACETAMINOPHEN 5-325 MG PO TABS: 1.0000 | ORAL_TABLET | Freq: Three times a day (TID) | ORAL | Status: DC | PRN

## 2013-02-27 NOTE — Progress Notes (Signed)
Pt called- states she has a lot of swelling and pain at night still.  Takes 1/2 vicodin this helps her sleep.  Requesting 10 more tabs until her appt with Dr. Micheline Chapman next week.  Authorized per Dr. Oneida Alar.

## 2013-03-06 ENCOUNTER — Ambulatory Visit (INDEPENDENT_AMBULATORY_CARE_PROVIDER_SITE_OTHER): Payer: BC Managed Care – PPO | Admitting: Sports Medicine

## 2013-03-06 ENCOUNTER — Encounter: Payer: Self-pay | Admitting: Sports Medicine

## 2013-03-06 VITALS — BP 115/76 | HR 76 | Ht 62.0 in | Wt 328.0 lb

## 2013-03-06 DIAGNOSIS — M79609 Pain in unspecified limb: Secondary | ICD-10-CM

## 2013-03-06 DIAGNOSIS — M79671 Pain in right foot: Secondary | ICD-10-CM

## 2013-03-06 NOTE — Progress Notes (Signed)
  Subjective:    Patient ID: [name], female    DOB: Apr 11, 1951, 62 y.o.   MRN: [id number]  HPI Patient comes in today for followup on right foot pain and swelling. Symptoms are improving albeit slowly. She is still requiring a small dose of hydrocodone from time to time particularly at night. Her main complaint is persistent swelling in the foot which is worse at the end of the day. She has weaned from her postop shoe but still ambulating with the assistance of a cane. Recent x-rays showed no obvious fracture.    Review of Systems     Objective:   Physical Exam Obese, no acute distress.  Foot: There is still 2-3+ pitting edema across the dorsum of the right foot. Skin is not erythematous and not warm to touch. No significant pain with metatarsal squeeze or with direct palpation across the dorsum of the foot. She continues to walk with a limp in the assistance of a cane.       Assessment & Plan:  1. Improving right foot pain and swelling likely secondary to metatarsal stress reaction 2. Obesity  Patient is making slow but steady improvement. She will continue to elevate the affected extremity is much as possible and followup with me in 3 weeks. I don't mind refilling her hydrocodone one more time next week if needed but I explained to her that this would be the last prescription. If symptoms do not continue to improve then I would recommend that she followup with Dr. Sharol Given whom she is seen in the past.

## 2013-03-17 ENCOUNTER — Other Ambulatory Visit: Payer: Self-pay | Admitting: Sports Medicine

## 2013-03-18 ENCOUNTER — Other Ambulatory Visit: Payer: BC Managed Care – PPO

## 2013-03-28 ENCOUNTER — Ambulatory Visit (INDEPENDENT_AMBULATORY_CARE_PROVIDER_SITE_OTHER): Payer: BC Managed Care – PPO | Admitting: Sports Medicine

## 2013-03-28 VITALS — BP 115/74 | Ht 63.0 in | Wt 328.0 lb

## 2013-03-28 DIAGNOSIS — M84374A Stress fracture, right foot, initial encounter for fracture: Secondary | ICD-10-CM

## 2013-03-28 DIAGNOSIS — M84376A Stress fracture, unspecified foot, initial encounter for fracture: Secondary | ICD-10-CM

## 2013-03-29 NOTE — Progress Notes (Signed)
  Subjective:    Patient ID: [name], female    DOB: 1951-07-18, 62 y.o.   MRN: [id number]  HPI Patient comes in today for followup on her right foot pain and swelling. Symptoms have greatly improved. It is now been 8 weeks since her initial onset of symptoms. Ultrasound and x-rays have been unremarkable but clinically this patient has been treated for a metatarsal stress reaction. She has weaned from her postop shoe into a good supportive sandal. Still getting some swelling at the end of the day but she no swelling in the other foot and ankle as well. No longer requiring pain medication.    Review of Systems     Objective:   Physical Exam Well-developed, well-nourished. No acute distress   Right foot: Minimal swelling across the dorsum of her foot but it is much improved from previous visits. No significant tenderness to palpation across the dorsum of the foot. Negative metatarsal squeeze. Neurovascularly intact distally. Walking with the assistance of a cane.       Assessment & Plan:  1. Improved right foot pain and swelling secondary to metatarsal stress reaction  Patient can continue to increase activity as tolerated. I'll discharge her from my care to followup when necessary.

## 2013-04-08 ENCOUNTER — Other Ambulatory Visit: Payer: BC Managed Care – PPO

## 2013-04-09 ENCOUNTER — Other Ambulatory Visit (INDEPENDENT_AMBULATORY_CARE_PROVIDER_SITE_OTHER): Payer: Medicare Other

## 2013-04-09 DIAGNOSIS — E785 Hyperlipidemia, unspecified: Secondary | ICD-10-CM

## 2013-04-09 LAB — LIPID PANEL: Total CHOL/HDL Ratio: 3

## 2013-04-10 ENCOUNTER — Encounter: Payer: Self-pay | Admitting: Obstetrics and Gynecology

## 2013-04-11 ENCOUNTER — Encounter: Payer: Self-pay | Admitting: Obstetrics & Gynecology

## 2013-04-11 ENCOUNTER — Ambulatory Visit (INDEPENDENT_AMBULATORY_CARE_PROVIDER_SITE_OTHER): Payer: BC Managed Care – PPO | Admitting: Obstetrics & Gynecology

## 2013-04-11 VITALS — BP 132/78 | HR 60 | Resp 20 | Ht 62.0 in | Wt 316.0 lb

## 2013-04-11 DIAGNOSIS — Z01419 Encounter for gynecological examination (general) (routine) without abnormal findings: Secondary | ICD-10-CM

## 2013-04-11 NOTE — Patient Instructions (Addendum)

## 2013-04-11 NOTE — Progress Notes (Signed)
62 y.o. G1P1001 DivorcedCaucasianF here for annual exam.  Doing well.  No vaginal bleeding.  Had feet problems earlier this spring.  Tendonitis in one foot and a stress fracture in the other foot.  Of course this interferes with exercise.     Patient's last menstrual period was 11/04/2011.          Sexually active: no  The current method of family planning is post menopausal status.    Exercising: no  not regularly-several issues with feet Smoker:  no  Health Maintenance: Pap:  03/29/12 WNL/negative HR HPV History of abnormal Pap:  no MMG:  10/30/09 normal, pt knows this is due Colonoscopy:  2004 patient aware is due this year BMD:   7/07 TDaP:  1/03 Screening Labs: PCP, Hb today: PCP, Urine today: PCP   reports that she has never smoked. She has never used smokeless tobacco. She reports that  drinks alcohol. She reports that she does not use illicit drugs.  Past Medical History  Diagnosis Date  . Hypertension   . Hypothyroid   . Obesity   . Post-menopausal bleeding   . Anemia   . Arthritis   . B12 deficiency   . CVA (cerebral infarction) 2 19 2012     r frontal  thrombotic    . LVH (left ventricular hypertrophy)      by echo 2012  . Atrial fibrillation   . Stroke     10/2010  . Left leg weakness     r/t stroke 10/2010  . Sleep apnea     no cpap - surgery to removed tonsils/cut down uvula  . Blood transfusion     at pre-op appt 10/2, per pt, no hx of bld transfusion  . Arthritis     kness, back, shoulders - no meds  . Hyperlipidemia     recent labs normal  . Diverticulosis   . Colon polyps   . Osteopetrosis   . Dyslipidemia   . Neuromuscular disorder   . Fatigue   . Stress fracture 10/13    right foot, healed within 3 weeks  . Tendonitis 1/14    left foot  . Stress fracture     again right foot stress reaction fracture    Past Surgical History  Procedure Laterality Date  . Total knee arthroplasty  1610,9604, 2011    Lt 2001, rt 2007 and revision left 2011  .  Rt shoulder surgery  06/2010    x3  . Eye muscle surgery  1952    left eye  . Svd      x 1  . Colonoscopy    . Tonsillectomy    . Nose surgery    . Hysteroscopy w/d&c  06/13/2011    Procedure: DILATATION AND CURETTAGE (D&C) /HYSTEROSCOPY;  Surgeon: Felipa Emory;  Location: [facility];  Service: Gynecology;  Laterality: N/A;  . Ddd l-spine      no surgery, had MRI    Current Outpatient Prescriptions  Medication Sig Dispense Refill  . atenolol (TENORMIN) 50 MG tablet Take 1 in am and 1/2 at night  135 tablet  1  . diclofenac sodium (VOLTAREN) 1 % GEL Apply topically.        . ergocalciferol (VITAMIN D2) 50000 UNITS capsule Take 50,000 Units by mouth once a week.        . fish oil-omega-3 fatty acids 1000 MG capsule Take 2 g by mouth daily.        . furosemide (LASIX) 20  MG tablet TAKE 1 TABLET BY MOUTH EVERY DAY  90 tablet  1  . gabapentin (NEURONTIN) 300 MG capsule Take 1 capsule (300 mg total) by mouth daily.  120 capsule  0  . levothyroxine (SYNTHROID, LEVOTHROID) 75 MCG tablet Take 1 tablet (75 mcg total) by mouth daily.  90 tablet  1  . lisinopril-hydrochlorothiazide (PRINZIDE,ZESTORETIC) 20-25 MG per tablet TAKE 1 TABLET BY MOUTH ONCE DAILY  90 tablet  1  . Rivaroxaban (XARELTO PO) Take by mouth.       . simvastatin (ZOCOR) 20 MG tablet Take 1 tablet (20 mg total) by mouth every evening.  90 tablet  1  . [DISCONTINUED] TOPAMAX 50 MG tablet Take 50 mg by mouth.       Current Facility-Administered Medications  Medication Dose Route Frequency Provider Last Rate Last Dose  . cyanocobalamin ((VITAMIN B-12)) injection 1,000 mcg  1,000 mcg Intramuscular Once [name], MD        Family History  Problem Relation Age of Onset  . COPD Mother   . Hypertension Mother   . Osteoporosis Mother   . Diabetes Father   . Hypertension Father   . Liver cancer Father   . Hypertension Sister   . Hypertension Brother     ROS:  Pertinent items are noted in HPI.  Otherwise, a  comprehensive ROS was negative.  Exam:   BP 132/78  Pulse 60  Resp 20  Ht _0  (1.575 m)  Wt 316 lb (143.337 kg)  BMI 57.78 kg/m2  LMP 11/04/2011  Weight change: -19lbs  Height: _1  (157.5 cm)  Ht Readings from Last 3 Encounters:  04/11/13 _2  (1.575 m)  03/28/13 _3  (1.6 m)  03/06/13 _4  (1.575 m)    General appearance: alert, cooperative and appears stated age Head: Normocephalic, without obvious abnormality, atraumatic Neck: no adenopathy, supple, symmetrical, trachea midline and thyroid normal to inspection and palpation Lungs: clear to auscultation bilaterally Breasts: normal appearance, no masses or tenderness Heart: regular rate and rhythm Abdomen: soft, non-tender; bowel sounds normal; no masses,  no organomegaly Extremities: extremities normal, atraumatic, no cyanosis or edema Skin: Skin color, texture, turgor normal. No rashes or lesions Lymph nodes: Cervical, supraclavicular, and axillary nodes normal. No abnormal inguinal nodes palpated Neurologic: Grossly normal   Pelvic: External genitalia:  no lesions              Urethra:  normal appearing urethra with no masses, tenderness or lesions              Bartholins and Skenes: normal                 Vagina: normal appearing vagina with normal color and discharge, no lesions              Cervix: no lesions              Pap taken: no Bimanual Exam:  Uterus:  normal size, contour, position, consistency, mobility, non-tender              Adnexa: normal adnexa and no mass, fullness, tenderness               Rectovaginal: Confirms               Anus:  normal sphincter tone, no lesions  A:  Well Woman with normal exam Low Vitamin D Morbid obesity CVA 2/12 H/O PMP bleeding 3/13, negative evalation  P:   Mammogram due.  Knows due.  pap smear with neg HR HPV 7/13.  No Pap today. return annually or prn  An After Visit Summary was printed and given to the patient.

## 2013-04-30 ENCOUNTER — Other Ambulatory Visit: Payer: Self-pay | Admitting: Dermatology

## 2013-05-10 ENCOUNTER — Telehealth: Payer: Self-pay | Admitting: Obstetrics & Gynecology

## 2013-05-10 NOTE — Telephone Encounter (Signed)
Patient had [place] 2 years ago and now is having some issues with bleeding

## 2013-05-10 NOTE — Telephone Encounter (Signed)
LMTCB 05/10/13

## 2013-05-13 NOTE — Telephone Encounter (Signed)
Spoke with pt about PMB. Pt noticed very light bloody DC last week from Monday to about Thursday. Reports LMP was "years ago." Advised pt that SM would want to see her. Sched OV tomorrow at 10:30 per pt request.

## 2013-05-14 ENCOUNTER — Ambulatory Visit (INDEPENDENT_AMBULATORY_CARE_PROVIDER_SITE_OTHER): Payer: BC Managed Care – PPO | Admitting: Obstetrics & Gynecology

## 2013-05-14 ENCOUNTER — Encounter: Payer: Self-pay | Admitting: Obstetrics & Gynecology

## 2013-05-14 VITALS — BP 118/80 | HR 60 | Resp 16 | Ht 62.0 in | Wt 316.0 lb

## 2013-05-14 DIAGNOSIS — N950 Postmenopausal bleeding: Secondary | ICD-10-CM

## 2013-05-14 NOTE — Patient Instructions (Signed)
Endometrial Biopsy Post-procedure Instructions   Cramping is common.  You may take Ibuprofen, Aleve, or Tylenol for the cramping.  This should resolve within 24 hours.     You may have a small amount of spotting.  You should wear a mini pad for the next few days.   You may have intercourse in 24 hours.   You need to call the office if you have any pelvic pain, fever, heavy bleeding, or foul smelling vaginal discharge.   Shower or bathe as normal   You will be notified within one week of your biopsy results or we will discuss your results at your follow-up appointment if needed.

## 2013-05-14 NOTE — Progress Notes (Signed)
Subjective:     Patient ID: [name], female   DOB: 18-May-1951, 62 y.o.   MRN: [id number]  HPI 62 yo G1P1 with PMP bleeding that was accompanied by some low back pain and cramping.  Has h/o endometrial polyp with hysteroscopy and polyp resection D&C 10/12.  Review of Systems  All other systems reviewed and are negative.       Objective:   Physical Exam  Constitutional: She appears well-developed and well-nourished.  Obese  Genitourinary: Vagina normal and uterus normal.  Skin: Skin is warm and dry.  Psychiatric: She has a normal mood and affect. Her behavior is normal.   Endometrial biopsy recommended.  Discussed with patient.  Verbal and written consent obtained.   Procedure:  Speculum placed.  Cervix visualized and cleansed with betadine prep.  A single toothed tenaculum was not applied to the anterior lip of the cervix.  Endometrial pipelle was advanced through the cervix into the endometrial cavity without difficulty.  Pipelle passed to 10 cm.  Suction applied and pipelle removed with good tissue sample obtained.  Tenculum removed.  No bleeding noted.  Patient tolerated procedure well.      Assessment:     PMP bleeding in pt with hx of endometrial polyp     Plan:     Endometrial biopsy pending.  Results will be called to pt.

## 2013-05-15 ENCOUNTER — Encounter: Payer: Self-pay | Admitting: Cardiology

## 2013-05-15 ENCOUNTER — Ambulatory Visit (INDEPENDENT_AMBULATORY_CARE_PROVIDER_SITE_OTHER): Payer: Medicare Other | Admitting: Cardiology

## 2013-05-15 VITALS — BP 110/80 | HR 80 | Ht 63.0 in | Wt 309.4 lb

## 2013-05-15 DIAGNOSIS — N289 Disorder of kidney and ureter, unspecified: Secondary | ICD-10-CM

## 2013-05-15 DIAGNOSIS — E785 Hyperlipidemia, unspecified: Secondary | ICD-10-CM

## 2013-05-15 DIAGNOSIS — I10 Essential (primary) hypertension: Secondary | ICD-10-CM

## 2013-05-15 DIAGNOSIS — I639 Cerebral infarction, unspecified: Secondary | ICD-10-CM

## 2013-05-15 DIAGNOSIS — R7989 Other specified abnormal findings of blood chemistry: Secondary | ICD-10-CM

## 2013-05-15 DIAGNOSIS — I6350 Cerebral infarction due to unspecified occlusion or stenosis of unspecified cerebral artery: Secondary | ICD-10-CM

## 2013-05-15 DIAGNOSIS — I4891 Unspecified atrial fibrillation: Secondary | ICD-10-CM

## 2013-05-15 DIAGNOSIS — R799 Abnormal finding of blood chemistry, unspecified: Secondary | ICD-10-CM

## 2013-05-15 DIAGNOSIS — G4733 Obstructive sleep apnea (adult) (pediatric): Secondary | ICD-10-CM

## 2013-05-15 DIAGNOSIS — I482 Chronic atrial fibrillation, unspecified: Secondary | ICD-10-CM

## 2013-05-15 NOTE — Assessment & Plan Note (Signed)
1.7 in Sept 2013

## 2013-05-15 NOTE — Assessment & Plan Note (Signed)
controlled 

## 2013-05-15 NOTE — Patient Instructions (Signed)
Your physician recommends that you schedule a follow-up appointment in: 6 months Your physician recommends that you have lab work today

## 2013-05-15 NOTE — Assessment & Plan Note (Signed)
Surgery 7/12

## 2013-05-15 NOTE — Assessment & Plan Note (Signed)
Rate controlled, asymptomatic

## 2013-05-15 NOTE — Progress Notes (Signed)
05/15/2013 [name]   1950-11-09  [id number]  Primary Physicia [name], MD Primary Cardiologist: Dr Gwenlyn Found   HPI:  Pleasant 62 y/o female followed by Dr Gwenlyn Found with CAF. She is on Xarelto. She had a low risk Myoview and NL echo in 2012. She does have a history of prior Rt brain stroke with no significant residual. She is here for a 6 month follow up. Since we saw her last she has been put back on a statin (she had elevated LFTs in the past). She has a lot of problems with arthritis, especially her feet recently. She is now using a cane. From a cardiac standpoint she has done well. She is unaware of her AF. She denies any unusual SOB or palpitations.   Current Outpatient Prescriptions  Medication Sig Dispense Refill  . atenolol (TENORMIN) 50 MG tablet Take 1 in am and 1/2 at night  135 tablet  1  . diclofenac sodium (VOLTAREN) 1 % GEL Apply topically.        . ergocalciferol (VITAMIN D2) 50000 UNITS capsule Take 50,000 Units by mouth once a week.       . fish oil-omega-3 fatty acids 1000 MG capsule Take 2 g by mouth daily.        . furosemide (LASIX) 20 MG tablet TAKE 1 TABLET BY MOUTH EVERY DAY  90 tablet  1  . gabapentin (NEURONTIN) 300 MG capsule Take 1 capsule (300 mg total) by mouth daily.  120 capsule  0  . levothyroxine (SYNTHROID, LEVOTHROID) 75 MCG tablet Take 1 tablet (75 mcg total) by mouth daily.  90 tablet  1  . lisinopril-hydrochlorothiazide (PRINZIDE,ZESTORETIC) 20-25 MG per tablet TAKE 1 TABLET BY MOUTH ONCE DAILY  90 tablet  1  . Rivaroxaban (XARELTO PO) Take 20 mg by mouth.       . simvastatin (ZOCOR) 20 MG tablet Take 1 tablet (20 mg total) by mouth every evening.  90 tablet  1  . [DISCONTINUED] TOPAMAX 50 MG tablet Take 50 mg by mouth.       Current Facility-Administered Medications  Medication Dose Route Frequency Provider Last Rate Last Dose  . cyanocobalamin ((VITAMIN B-12)) injection 1,000 mcg  1,000 mcg Intramuscular Once [name], MD         Allergies  Allergen Reactions  . Ambien [Zolpidem Tartrate]     Amnesia and fall   . Prevacid [Lansoprazole] Swelling  . Ace Inhibitors Cough  . Benadryl [Diphenhydramine Hcl]     topical  . Losartan     Elevated creatinine and swelling  . Motrin [Ibuprofen] Other (See Comments)    ANKLE EDEMA   . Prilosec [Omeprazole]     Swelling in ankles.    History   Social History  . Marital Status: Divorced    Spouse Name: N/A    Number of Children: N/A  . Years of Education: N/A   Occupational History  . Not on file.   Social History Main Topics  . Smoking status: Never Smoker   . Smokeless tobacco: Never Used  . Alcohol Use: 0.0 oz/week     Comment: occ  . Drug Use: No  . Sexual Activity: No   Other Topics Concern  . 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  Cardiovascular: negative for chest pain, dyspnea on exertion, edema, orthopnea, palpitations, paroxysmal nocturnal dyspnea or shortness of breath Dermatological: negative for rash Respiratory: negative for cough or wheezing Urologic: negative for hematuria Abdominal: negative for nausea, vomiting, diarrhea, bright red blood per rectum, melena, or hematemesis Neurologic: negative for visual changes, syncope, or dizziness, she does have some neuropathic pain in her Lt leg which she attributes to her stroke. She has had problems with PPIs in the past (diarrhea). She "larygeal reflux syndrome" and takes Pepcid prn. All other systems reviewed and are otherwise negative except as noted above.    Blood pressure 110/80, pulse 80, height 5' 3" (1.6 m), weight 309 lb 6.4 oz (140.343 kg), last menstrual period 05/06/2013.  General appearance: alert, cooperative, no distress and morbidly obese Lungs: clear to auscultation  bilaterally Heart: irregularly irregular rhythm  EKG AF with CVR  ASSESSMENT AND PLAN:   Chronic atrial fibrillation Rate controlled, asymptomatic  HYPERLIPIDEMIA elavated LFTs in the past, now back on statin  MORBID OBESITY .  HYPERTENSION controlled  CVA (cerebral vascular accident) No significant residual  OSA (obstructive sleep apnea) Surgery 7/12  Elevated serum creatinine 1.7 in Sept 2013   PLAN  I did order a CMET today. Her last SCr was 1.7 and her last LFTs were elevated and she has since been put back on a statin. She can follow up with Dr Gwenlyn Found in 6 months.  [name] KPA-C 05/15/2013 3:11 PM

## 2013-05-15 NOTE — Assessment & Plan Note (Signed)
No significant residual

## 2013-05-15 NOTE — Assessment & Plan Note (Signed)
elavated LFTs in the past, now back on statin

## 2013-05-16 ENCOUNTER — Encounter: Payer: Self-pay | Admitting: Cardiovascular Disease

## 2013-05-16 LAB — COMPREHENSIVE METABOLIC PANEL
ALT: 17 U/L (ref 0–35)
AST: 24 U/L (ref 0–37)
Albumin: 4.1 g/dL (ref 3.5–5.2)
Alkaline Phosphatase: 78 U/L (ref 39–117)
BUN: 18 mg/dL (ref 6–23)
CO2: 29 mEq/L (ref 19–32)
Calcium: 10.1 mg/dL (ref 8.4–10.5)
Chloride: 101 mEq/L (ref 96–112)
Creat: 0.97 mg/dL (ref 0.50–1.10)
Glucose, Bld: 96 mg/dL (ref 70–99)
Potassium: 4.9 mEq/L (ref 3.5–5.3)
Sodium: 141 mEq/L (ref 135–145)
Total Bilirubin: 0.9 mg/dL (ref 0.3–1.2)
Total Protein: 7.1 g/dL (ref 6.0–8.3)

## 2013-05-17 ENCOUNTER — Telehealth: Payer: Self-pay | Admitting: *Deleted

## 2013-05-17 NOTE — Telephone Encounter (Signed)
Results given. Verbalized understanding.

## 2013-05-17 NOTE — Telephone Encounter (Signed)
Message copied by Raiford Simmonds on Fri May 17, 2013  2:27 PM ------      Message from: Erlene Quan      Created: Thu May 16, 2013 11:48 AM       Let pt know her LFTs and renal function are normal       ------

## 2013-05-21 ENCOUNTER — Ambulatory Visit (INDEPENDENT_AMBULATORY_CARE_PROVIDER_SITE_OTHER): Payer: Medicare Other

## 2013-05-21 DIAGNOSIS — Z23 Encounter for immunization: Secondary | ICD-10-CM

## 2013-06-02 ENCOUNTER — Other Ambulatory Visit: Payer: Self-pay | Admitting: Obstetrics & Gynecology

## 2013-06-03 NOTE — Telephone Encounter (Signed)
rx done.  Will recheck level next yr.

## 2013-06-03 NOTE — Telephone Encounter (Signed)
Will you bring me old chart please?  Thanks.

## 2013-06-03 NOTE — Telephone Encounter (Signed)
Pt had [facility] 04/11/13. Note stated at [facility] that pt has low vitamin d but we did not check it at visit nor did we give a rx. It said that pcp does labs. Rx came in for vitamin d & we prescribed it last year at [facility]. Should this be denied since we didn't check it?

## 2013-06-03 NOTE — Telephone Encounter (Signed)
Chart is in your chair

## 2013-06-17 ENCOUNTER — Other Ambulatory Visit: Payer: Self-pay | Admitting: Sports Medicine

## 2013-06-29 ENCOUNTER — Other Ambulatory Visit: Payer: Self-pay | Admitting: Internal Medicine

## 2013-07-04 ENCOUNTER — Other Ambulatory Visit: Payer: Self-pay | Admitting: Internal Medicine

## 2013-07-15 ENCOUNTER — Other Ambulatory Visit: Payer: Self-pay | Admitting: Obstetrics & Gynecology

## 2013-07-15 DIAGNOSIS — Z1231 Encounter for screening mammogram for malignant neoplasm of breast: Secondary | ICD-10-CM

## 2013-07-16 ENCOUNTER — Encounter: Payer: Self-pay | Admitting: Internal Medicine

## 2013-07-16 ENCOUNTER — Ambulatory Visit (INDEPENDENT_AMBULATORY_CARE_PROVIDER_SITE_OTHER): Payer: Medicare Other | Admitting: Internal Medicine

## 2013-07-16 VITALS — BP 120/72 | HR 78 | Temp 98.5°F | Wt 314.0 lb

## 2013-07-16 DIAGNOSIS — D518 Other vitamin B12 deficiency anemias: Secondary | ICD-10-CM

## 2013-07-16 DIAGNOSIS — I10 Essential (primary) hypertension: Secondary | ICD-10-CM

## 2013-07-16 DIAGNOSIS — Z7901 Long term (current) use of anticoagulants: Secondary | ICD-10-CM

## 2013-07-16 DIAGNOSIS — Z23 Encounter for immunization: Secondary | ICD-10-CM

## 2013-07-16 DIAGNOSIS — R7309 Other abnormal glucose: Secondary | ICD-10-CM

## 2013-07-16 DIAGNOSIS — R7303 Prediabetes: Secondary | ICD-10-CM

## 2013-07-16 DIAGNOSIS — D519 Vitamin B12 deficiency anemia, unspecified: Secondary | ICD-10-CM

## 2013-07-16 DIAGNOSIS — E785 Hyperlipidemia, unspecified: Secondary | ICD-10-CM

## 2013-07-16 MED ORDER — CYANOCOBALAMIN 1000 MCG/ML IJ SOLN
1000.0000 ug | Freq: Once | INTRAMUSCULAR | Status: AC
  Administered 2013-07-16: 1000 ug via INTRAMUSCULAR

## 2013-07-16 NOTE — Progress Notes (Signed)
Chief Complaint  Patient presents with  . Follow-up    multiple issues     HPI: Patient comes in today for follow up of  multiple medical problems.  Pre diabetes  AF  Same   meds  . Still on xeralto .  HT  :  Controlled  LIPIDS taking meds no change  No se noted  Off or on meds OSA : Sleeping ok   Old cva  Left foot. Still a problem but becoming more mobile  Practices walking without assistance   Cane when out breathing  Ok . HH of 1 no pets.  ROS: See pertinent positives and negatives per HPI.no cp sob  New nuero sx  Vision change   Past Medical History  Diagnosis Date  . Hypertension   . Hypothyroid   . Obesity   . Post-menopausal bleeding   . Arthritis   . B12 deficiency   . CVA (cerebral infarction) 2 19 2012     r frontal  thrombotic    . LVH (left ventricular hypertrophy)      by echo 2012  . Atrial fibrillation   . Stroke     10/2010  . Left leg weakness     r/t stroke 10/2010  . Sleep apnea     no cpap - surgery to removed tonsils/cut down uvula  . Blood transfusion     at pre-op appt 10/2, per pt, no hx of bld transfusion  . Hyperlipidemia     recent labs normal  . Diverticulosis   . Colon polyps   . Osteopetrosis   . Neuromuscular disorder   . Fatigue   . Stress fracture 10/13    right foot, healed within 3 weeks  . Tendonitis 1/14    left foot    Family History  Problem Relation Age of Onset  . COPD Mother   . Hypertension Mother   . Osteoporosis Mother   . Diabetes Father   . Hypertension Father   . Liver cancer Father   . Hypertension Sister   . Hypertension Brother     History   Social History  . Marital Status: Divorced    Spouse Name: N/A    Number of Children: N/A  . Years of Education: N/A   Social History Main Topics  . Smoking status: Never Smoker   . Smokeless tobacco: Never Used  . Alcohol Use: 0.0 oz/week     Comment: occ  . Drug Use: No  . Sexual Activity: No   Other Topics Concern  . 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 atenolol (TENORMIN) 50 MG tablet TAKE 1 TABLET BY MOUTH IN THE MORNING AND 1/2 TABLET AT NIGHT  . diclofenac sodium (VOLTAREN) 1 % GEL Apply topically.    . fish oil-omega-3 fatty acids 1000 MG capsule Take 2 g by mouth daily.    . furosemide (LASIX) 20 MG tablet TAKE 1 TABLET BY MOUTH EVERY DAY  . gabapentin (NEURONTIN) 300 MG capsule TAKE 1 CAPSULE (300 MG TOTAL) BY MOUTH DAILY.  Marland Kitchen levothyroxine (SYNTHROID, LEVOTHROID) 75 MCG tablet Take 1 tablet (75 mcg total) by mouth daily.  Marland Kitchen lisinopril-hydrochlorothiazide (PRINZIDE,ZESTORETIC) 20-25 MG per tablet TAKE 1 TABLET BY MOUTH ONCE DAILY  .  Rivaroxaban (XARELTO PO) Take 20 mg by mouth.   . simvastatin (ZOCOR) 20 MG tablet Take 1 tablet (20 mg total) by mouth every evening.  . Vitamin D, Ergocalciferol, (DRISDOL) 50000 UNITS CAPS capsule TAKE 1 CAPSULE BY MOUTH EVERY WEEK    EXAM:  BP 120/72  Pulse 78  Temp(Src) 98.5 F (36.9 C) (Oral)  Wt 314 lb (142.429 kg)  SpO2 98%  LMP 05/06/2013  Body mass index is 55.64 kg/(m^2).  GENERAL: vitals reviewed and listed above, alert, oriented, appears well hydrated and in no acute distress  HEENT: atraumatic, conjunctiva  clear, no obvious abnormalities on inspection of external nose and ears   NECK: no obvious masses on inspection palpation no jvd  LUNGS: clear to auscultation bilaterally, no wheezes, rales or rhonchi, good air movement  CV: HRRR, no clubbing cyanosis min  peripheral edema nl cap refill   MS: moves all extremities gait  Cautious  Favors left foot   PSYCH: pleasant and cooperative, no obvious depression or anxiety  ASSESSMENT AND PLAN:  Discussed the following assessment and plan:  Prediabetes  Anemia, B12 deficiency - Plan: cyanocobalamin ((VITAMIN B-12)) injection 1,000  mcg  Chronic anticoagulation  Other and unspecified hyperlipidemia  Need for prophylactic vaccination against Streptococcus pneumoniae (pneumococcus) - Plan: Pneumococcal conjugate vaccine 13-valent  Need for Tdap vaccination - Plan: Tdap vaccine greater than or equal to 7yo IM  Unspecified essential hypertension Disc  Strategies and avoiding diabetes     Pt seems like getting back to herself more mobile has helped  -Patient advised to return or notify health care team  if symptoms worsen or persist or new concerns arise.  Patient Instructions  Get you  Mammogram and colonoscopy .   Consider  Bone health with your hc of foot fracture.   Wellness visit in 6 months plan labs at that visit.   Continue lifestyle intervention healthy eating and exercise .   update immuniz etc  Total visit 81mns > 50% spent counseling and coordinating care     Dequane Strahan K. Tacara Hadlock M.D. Lab Results  Component Value Date   WBC 11.2* 12/19/2012   HGB 11.8* 12/19/2012   HCT 36.8 12/19/2012   PLT 298.0 12/19/2012   GLUCOSE 96 05/15/2013   CHOL 131 04/09/2013   TRIG 108.0 04/09/2013   HDL 45.70 04/09/2013   LDLDIRECT 139.4 06/30/2010   LDLCALC 64 04/09/2013   ALT 17 05/15/2013   AST 24 05/15/2013   NA 141 05/15/2013   K 4.9 05/15/2013   CL 101 05/15/2013   CREATININE 0.97 05/15/2013   BUN 18 05/15/2013   CO2 29 05/15/2013   TSH 0.63 12/19/2012   INR 1.04 03/18/2011   HGBA1C 6.2 12/19/2012   MICROALBUR 0.2 07/09/2009

## 2013-07-16 NOTE — Patient Instructions (Signed)
Get you  Mammogram and colonoscopy .   Consider  Bone health with your hc of foot fracture.   Wellness visit in 6 months plan labs at that visit.   Continue lifestyle intervention healthy eating and exercise .

## 2013-07-24 ENCOUNTER — Other Ambulatory Visit: Payer: Self-pay | Admitting: Internal Medicine

## 2013-07-31 ENCOUNTER — Telehealth: Payer: Self-pay | Admitting: Internal Medicine

## 2013-07-31 NOTE — Telephone Encounter (Signed)
Patient Information:  Caller Name: Norva  Phone: [phone]  Patient: [name], [name]  Gender: Female  DOB: June 20, 1951  Age: 62 Years  PCP: [name] (Family Practice)  Office Follow Up:  Does the office need to follow up with this patient?: Yes  Instructions For The Office: Please call pt back per her request about lab test for gout -- See EPIC note  RN Note:  RN advised home care and FU within  3 days if symptoms still present, or call back if symptoms worsen. Pt is wanting to know if she can  come in for just  blood test to check for Gout. Pt has never had Gout before. RN advised OV if she is wanting lab performed ,but pt still requesting lab only and wants Dr. Regis Bill recommendation.  Symptoms  Reason For Call & Symptoms: Today, 07/31/2013., pt calling with c/o of left  great  toe pain  , radiating to  joint on side of foot   since 07/30/2013. Also  pain with walking and painful to the touch. She has applied Voltaren cream on hand that helped "tiny biit" . Toe feels warm, but no reddness. Slight swelling, but has improved overnight. Pain increases with walking. No known injury.  Reviewed Health History In EMR: Yes  Reviewed Medications In EMR: Yes  Reviewed Allergies In EMR: Yes  Reviewed Surgeries / Procedures: Yes  Date of Onset of Symptoms: 07/30/2013  Treatments Tried: Voltaren gel  Treatments Tried Worked: Yes  Guideline(s) Used:  Toe Pain  Wound Infection  Disposition Per Guideline:   See Within 3 Days in Office  Reason For Disposition Reached:   Swollen joint and no fever or redness  Advice Given:  Reassurance:  It also sounds like you can take care of this yourself at home.  Pain Medicines:  For pain relief, you can take either acetaminophen, ibuprofen, or naproxen.  Acetaminophen (e.g., Tylenol):  Regular Strength Tylenol: Take 650 mg (two 325 mg pills) by mouth every 4-6 hours as needed. Each Regular Strength Tylenol pill has 325 mg of acetaminophen.  Call  Back If:  Swelling, redness, or fever occur  Severe pain not relieved by pain medication  You become worse.  Patient Refused Recommendation:  Patient Refused Care Advice  Pt is wanting a call back -- She is requesting lab work to check for Gout, but doesn't want OV although it was advised if she wants lab work.

## 2013-07-31 NOTE — Telephone Encounter (Signed)
The patient called me back and left a message on my machine.  Instructed me not to call back.  She will continue to monitor her condition through Thanksgiving.  If she is not getting better or becomes worse than she will call the office.

## 2013-07-31 NOTE — Telephone Encounter (Signed)
If this is gout then lab at the time of the pain  is not very helpful. Usually use antiinflammatory until stable   and then check uric acid and bmp and cbcdiff  When better    .consideration of other therapy if this is gout.

## 2013-07-31 NOTE — Telephone Encounter (Signed)
Left message for the pt to return my call at the below listed number.

## 2013-08-05 ENCOUNTER — Ambulatory Visit (HOSPITAL_COMMUNITY): Payer: Medicare Other

## 2013-08-15 ENCOUNTER — Other Ambulatory Visit: Payer: Self-pay | Admitting: Internal Medicine

## 2013-08-20 ENCOUNTER — Encounter: Payer: Self-pay | Admitting: Internal Medicine

## 2013-08-20 ENCOUNTER — Ambulatory Visit (HOSPITAL_COMMUNITY)
Admission: RE | Admit: 2013-08-20 | Discharge: 2013-08-20 | Disposition: A | Discharge status: Home / Self Care | Payer: Medicare Other | Source: Ambulatory Visit | Attending: Obstetrics & Gynecology | Admitting: Obstetrics & Gynecology

## 2013-08-20 DIAGNOSIS — Z1231 Encounter for screening mammogram for malignant neoplasm of breast: Secondary | ICD-10-CM | POA: Insufficient documentation

## 2013-09-04 ENCOUNTER — Telehealth: Payer: Self-pay | Admitting: *Deleted

## 2013-09-04 NOTE — Telephone Encounter (Signed)
Dr Gwenlyn Found reviewed the chart and gave clearance to hold xarelto 2 days prior to the colonoscopy and restart post procedure. Form faxed to [facility]

## 2013-09-13 ENCOUNTER — Telehealth: Payer: Self-pay | Admitting: Internal Medicine

## 2013-09-13 DIAGNOSIS — R7303 Prediabetes: Secondary | ICD-10-CM

## 2013-09-13 DIAGNOSIS — I999 Unspecified disorder of circulatory system: Secondary | ICD-10-CM

## 2013-09-13 NOTE — Telephone Encounter (Signed)
Pt needs a referral to see dr. Gretta Cool opthalmology due to insurance. appt Monday.

## 2013-09-13 NOTE — Telephone Encounter (Signed)
Referral placed for opthalmology.

## 2013-09-16 ENCOUNTER — Encounter: Payer: Self-pay | Admitting: Family

## 2013-09-16 ENCOUNTER — Telehealth: Payer: Self-pay | Admitting: Internal Medicine

## 2013-09-16 ENCOUNTER — Ambulatory Visit (INDEPENDENT_AMBULATORY_CARE_PROVIDER_SITE_OTHER): Payer: Commercial Managed Care - HMO | Admitting: Family

## 2013-09-16 VITALS — BP 142/92 | HR 97 | Temp 97.7°F

## 2013-09-16 DIAGNOSIS — L03012 Cellulitis of left finger: Secondary | ICD-10-CM

## 2013-09-16 DIAGNOSIS — R0789 Other chest pain: Secondary | ICD-10-CM

## 2013-09-16 DIAGNOSIS — R071 Chest pain on breathing: Secondary | ICD-10-CM

## 2013-09-16 DIAGNOSIS — IMO0002 Reserved for concepts with insufficient information to code with codable children: Secondary | ICD-10-CM

## 2013-09-16 DIAGNOSIS — R091 Pleurisy: Secondary | ICD-10-CM

## 2013-09-16 MED ORDER — PREDNISONE 20 MG PO TABS: ORAL_TABLET | ORAL | Status: AC

## 2013-09-16 MED ORDER — DOXYCYCLINE HYCLATE 100 MG PO TABS: 100.0000 mg | ORAL_TABLET | Freq: Two times a day (BID) | ORAL | Status: DC

## 2013-09-16 NOTE — Telephone Encounter (Signed)
Patient Information:  Caller Name: Pasty  Phone: [phone]  Patient: [name], [name]  Gender: Female  DOB: Dec 30, 1950  Age: 63 Years  PCP: [name] (Family Practice)  Office Follow Up:  Does the office need to follow up with this patient?: No  Instructions For The Office: N/A  RN Note:  Triaged per Muscle Pain Guideline; See in 72 hours due to impairs ability to perform ADL's Pt requesting appt for today if not requesting appt for Wednesday  Symptoms  Reason For Call & Symptoms: Pt is calling and states that she is having pain under her left breast and radiated to the back;  sx started 09/15/13; hurts only with movement and feels like a muscle strain; hurts when she turns certain ways or twisted a little;  sleeping well;  no other sx noted;  Reviewed Health History In EMR: Yes  Reviewed Medications In EMR: Yes  Reviewed Allergies In EMR: Yes  Reviewed Surgeries / Procedures: Yes  Date of Onset of Symptoms: 09/15/2013  Treatments Tried: Aleve-  but can not take due to she is on blood thinners Tylenol she can take but doesn't seem to help Tramadol took last night and seemed to help a little  Treatments Tried Worked: No  Guideline(s) Used:  No Protocol Available - Sick Adult  Disposition Per Guideline:   See Within 3 Days in Office  Reason For Disposition Reached:   Nursing judgment  Advice Given:  Call Back If:  New symptoms develop  You become worse.  Patient Will Follow Care Advice:  YES  Appointment Scheduled:  09/16/2013 14:30:00 Appointment Scheduled Provider:  Roxy Cedar [facility])

## 2013-09-16 NOTE — Patient Instructions (Signed)
Chest Wall Pain Chest wall pain is pain in or around the bones and muscles of your chest. It may take up to 6 weeks to get better. It may take longer if you must stay physically active in your work and activities.  CAUSES  Chest wall pain may happen on its own. However, it may be caused by:  A viral illness like the flu.  Injury.  Coughing.  Exercise.  Arthritis.  Fibromyalgia.  Shingles. HOME CARE INSTRUCTIONS   Avoid overtiring physical activity. Try not to strain or perform activities that cause pain. This includes any activities using your chest or your abdominal and side muscles, especially if heavy weights are used.  Put ice on the sore area.  Put ice in a plastic bag.  Place a towel between your skin and the bag.  Leave the ice on for 15-20 minutes per hour while awake for the first 2 days.  Only take over-the-counter or prescription medicines for pain, discomfort, or fever as directed by your caregiver. SEEK IMMEDIATE MEDICAL CARE IF:   Your pain increases, or you are very uncomfortable.  You have a fever.  Your chest pain becomes worse.  You have new, unexplained symptoms.  You have nausea or vomiting.  You feel sweaty or lightheaded.  You have a cough with phlegm (sputum), or you cough up blood. MAKE SURE YOU:   Understand these instructions.  Will watch your condition.  Will get help right away if you are not doing well or get worse. Document Released: 08/22/2005 Document Revised: 11/14/2011 Document Reviewed: 04/18/2011 [facility] Patient Information 2014 [place], [place].

## 2013-09-16 NOTE — Progress Notes (Signed)
Subjective:    Patient ID: [name], female    DOB: September 30, 1950, 63 y.o.   MRN: [id number]  HPI  63 year old caucasian female, nonsmoker presenting for left breast pain and follow-up infection of left pointer finger.  Symptoms present x 1 day.  The pain radiates from chest to back.  On scale of 1-10, pain over 10 during movement.     Seen 10 days ago and ordered Keflex for left pointer finger by [facility].  Took last dose last night but finger is still red, but improved.     Review of Systems  Constitutional: Negative.   HENT: Negative.   Eyes: Negative.   Respiratory: Negative.   Cardiovascular: Negative.   Gastrointestinal: Negative.   Endocrine: Negative.   Genitourinary: Negative.   Musculoskeletal:       Left breast pain that radiates to the back  Skin:       Left pointer finger  Allergic/Immunologic: Negative.   Neurological: Negative.   Hematological: Negative.   Psychiatric/Behavioral: Negative.    Past Medical History  Diagnosis Date  . Hypertension   . Hypothyroid   . Obesity   . Post-menopausal bleeding   . Arthritis   . B12 deficiency   . CVA (cerebral infarction) 2 19 2012     r frontal  thrombotic    . LVH (left ventricular hypertrophy)      by echo 2012  . Atrial fibrillation   . Stroke     10/2010  . Left leg weakness     r/t stroke 10/2010  . Sleep apnea     no cpap - surgery to removed tonsils/cut down uvula  . Blood transfusion     at pre-op appt 10/2, per pt, no hx of bld transfusion  . Hyperlipidemia     recent labs normal  . Diverticulosis   . Colon polyps   . Osteopetrosis   . Neuromuscular disorder   . Fatigue   . Stress fracture 10/13    right foot, healed within 3 weeks  . Tendonitis 1/14    left foot    History   Social History  . Marital Status: Divorced    Spouse Name: N/A    Number of Children: N/A  . Years of Education: N/A   Occupational History  . Not on file.   Social History Main Topics  . Smoking status:  Never Smoker   . Smokeless tobacco: Never Used  . Alcohol Use: 0.0 oz/week     Comment: occ  . Drug Use: No  . Sexual Activity: No   Other Topics Concern  . 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 Total knee arthroplasty  8032,1224, 2011    Lt 2001, rt 2007 and revision left 2011  . Rt shoulder surgery  06/2010    x3  . Eye muscle surgery  1952    left eye  . Tonsillectomy    . Nose surgery    . Hysteroscopy w/d&c  06/13/2011    Procedure: DILATATION AND CURETTAGE (D&C) /HYSTEROSCOPY;  Surgeon: Felipa Emory;  Location: [facility];  Service: Gynecology;  Laterality: N/A;    Family History  Problem Relation Age of Onset  . COPD Mother   .  Hypertension Mother   . Osteoporosis Mother   . Diabetes Father   . Hypertension Father   . Liver cancer Father   . Hypertension Sister   . Hypertension Brother     Allergies  Allergen Reactions  . Ambien [Zolpidem Tartrate]     Amnesia and fall   . Prevacid [Lansoprazole] Swelling  . Ace Inhibitors Cough  . Benadryl [Diphenhydramine Hcl]     topical  . Losartan     Elevated creatinine and swelling  . Motrin [Ibuprofen] Other (See Comments)    ANKLE EDEMA   . Prilosec [Omeprazole]     Swelling in ankles.    Current Outpatient Prescriptions on File Prior to Visit  Medication Sig Dispense Refill  . atenolol (TENORMIN) 50 MG tablet TAKE 1 TABLET BY MOUTH IN THE MORNING AND 1/2 TABLET AT NIGHT  135 tablet  0  . diclofenac sodium (VOLTAREN) 1 % GEL Apply topically.        . fish oil-omega-3 fatty acids 1000 MG capsule Take 2 g by mouth daily.        . furosemide (LASIX) 20 MG tablet TAKE 1 TABLET BY MOUTH EVERY DAY  90 tablet  1  . gabapentin (NEURONTIN) 300 MG capsule TAKE 1 CAPSULE (300 MG TOTAL) BY MOUTH DAILY.  120 capsule  0  . levothyroxine (SYNTHROID,  LEVOTHROID) 75 MCG tablet TAKE 1 TABLET BY MOUTH EVERY DAY  90 tablet  1  . lisinopril-hydrochlorothiazide (PRINZIDE,ZESTORETIC) 20-25 MG per tablet TAKE 1 TABLET BY MOUTH ONCE DAILY  90 tablet  0  . Rivaroxaban (XARELTO PO) Take 20 mg by mouth.       . simvastatin (ZOCOR) 20 MG tablet TAKE 1 TABLET BY MOUTH EVERY EVENING  90 tablet  1  . Vitamin D, Ergocalciferol, (DRISDOL) 50000 UNITS CAPS capsule TAKE 1 CAPSULE BY MOUTH EVERY WEEK  26 capsule  1  . [DISCONTINUED] TOPAMAX 50 MG tablet Take 50 mg by mouth.       Current Facility-Administered Medications on File Prior to Visit  Medication Dose Route Frequency Provider Last Rate Last Dose  . cyanocobalamin ((VITAMIN B-12)) injection 1,000 mcg  1,000 mcg Intramuscular Once [name], MD        BP 142/92  Pulse 97  Temp(Src) 97.7 F (36.5 C) (Oral)  SpO2 94%  LMP 09/01/2014chart    Objective:   Physical Exam  Constitutional: She is oriented to person, place, and time. She appears well-developed and well-nourished.  HENT:  Head: Normocephalic and atraumatic.  Cardiovascular: Normal rate, regular rhythm and normal heart sounds.   Pulmonary/Chest: Effort normal and breath sounds normal.  Pain on left side during deep palpation  Lymphadenopathy:    She has no cervical adenopathy.  Neurological: She is alert and oriented to person, place, and time.  Skin: Skin is warm and dry.  Left pointer finger red and inflamed. No drainage noted.          Assessment & Plan:  Assessment 1. Chest wall pain/ Pleurisy 2. Paronychia of left finger  Plan: Prednisone 60 mg x 3, 40 mg x 3, and 20 mg x 3.  Doxycycline 100 mg PO BID x 10 days.  3. Contact office if problem persists or worsens.

## 2013-09-19 ENCOUNTER — Other Ambulatory Visit: Payer: Self-pay | Admitting: Internal Medicine

## 2013-09-20 ENCOUNTER — Telehealth: Payer: Self-pay | Admitting: Internal Medicine

## 2013-09-20 NOTE — Telephone Encounter (Signed)
Please advise 

## 2013-09-20 NOTE — Telephone Encounter (Signed)
Patient seen Padonda for this.

## 2013-09-20 NOTE — Telephone Encounter (Signed)
Patient Information:  Caller Name: Laiyla  Phone: [phone]  Patient: [name], [name]  Gender: Female  DOB: [birth year]  Age: 63 Years  PCP: [name] (Family Practice)  Office Follow Up:  Does the office need to follow up with this patient?: Yes  Instructions For The Office: Contact paitent only if provider reviews this information and wants to extend the Prednisone Rx.   Symptoms  Reason For Call & Symptoms: Patient calling about Pleurisy in left chest with pain under the arm and around the back.  She is on steroid dose pack.  She asks if she should continue steroids longer than Rx was ordered.  Her pain was rated at 10 or more with movement when seen in office on 09/16/13.  Currently rated at 2 of 10 with movement.  Reviewed Health History In EMR: Yes  Reviewed Medications In EMR: Yes  Reviewed Allergies In EMR: Yes  Reviewed Surgeries / Procedures: Yes  Date of Onset of Symptoms: 09/13/2013  Treatments Tried: Prednisone taper dose  Treatments Tried Worked: Yes  Guideline(s) Used:  Chest Pain  Disposition Per Guideline:   Home Care  Reason For Disposition Reached:   Intermittent mild chest pain lasting a few seconds each time  Advice Given:  Chest Pain Only When Coughing:  Chest pains that occur with coughing generally come from the chest wall and from irritation of the airways. They are usually not serious.  Expected Course:  These mild chest pains usually disappear within 3 days.  Call Back If:  Severe chest pain  Constant chest pain lasting longer than 5 minutes  Difficulty breathing  Fever  You become worse.  Patient Will Follow Care Advice:  YES

## 2013-09-23 NOTE — Telephone Encounter (Signed)
Please check and see if she is better. Improved?

## 2013-09-23 NOTE — Telephone Encounter (Signed)
Left message for pt to call back  °

## 2013-09-24 NOTE — Telephone Encounter (Signed)
Pt feels better as far as the pleurisy and pain but finger is still swollen. Pt still has a few days left of the abx and will call back later this week if it doesn't get better

## 2013-09-26 ENCOUNTER — Telehealth: Payer: Self-pay | Admitting: Internal Medicine

## 2013-09-26 NOTE — Telephone Encounter (Signed)
Pt was given abx for her finger and she is still having same symptom. Pt would another round of abx call into [facility] if possible. Pt saw np on 09-16-13

## 2013-09-27 ENCOUNTER — Other Ambulatory Visit: Payer: Self-pay

## 2013-09-27 ENCOUNTER — Telehealth: Payer: Self-pay | Admitting: Internal Medicine

## 2013-09-27 DIAGNOSIS — L03012 Cellulitis of left finger: Secondary | ICD-10-CM

## 2013-09-27 NOTE — Telephone Encounter (Signed)
Pt would like a call back concerning her finger.pt wanted appt for today, advised to wait for your call.

## 2013-09-27 NOTE — Telephone Encounter (Signed)
Pt at [facility],.  Referral is in the system, aluisio npi #  [id number]  Stating they need prior to seeing pt. Faxed memo to their office concerning referral appts.

## 2013-09-27 NOTE — Telephone Encounter (Signed)
Left message to advise pt of Padonda's message. Advised that she can call a dermatology office to schedule as she does not need a referral.

## 2013-09-27 NOTE — Telephone Encounter (Signed)
She has had 2 rounds of antibiotics at this point. I suggest we refer to dermatology

## 2013-10-10 ENCOUNTER — Ambulatory Visit (HOSPITAL_COMMUNITY)
Admission: RE | Admit: 2013-10-10 | Discharge: 2013-10-10 | Disposition: A | Discharge status: Home / Self Care | Payer: Medicare HMO | Source: Ambulatory Visit | Attending: Internal Medicine | Admitting: Internal Medicine

## 2013-10-10 ENCOUNTER — Other Ambulatory Visit: Payer: Self-pay | Admitting: Internal Medicine

## 2013-10-10 ENCOUNTER — Encounter: Payer: Self-pay | Admitting: Internal Medicine

## 2013-10-10 ENCOUNTER — Ambulatory Visit (INDEPENDENT_AMBULATORY_CARE_PROVIDER_SITE_OTHER): Payer: Medicare HMO | Admitting: Internal Medicine

## 2013-10-10 VITALS — BP 116/70 | Temp 98.7°F | Ht 61.5 in

## 2013-10-10 DIAGNOSIS — M7989 Other specified soft tissue disorders: Secondary | ICD-10-CM

## 2013-10-10 DIAGNOSIS — M19049 Primary osteoarthritis, unspecified hand: Secondary | ICD-10-CM | POA: Insufficient documentation

## 2013-10-10 DIAGNOSIS — M79609 Pain in unspecified limb: Secondary | ICD-10-CM

## 2013-10-10 DIAGNOSIS — M79671 Pain in right foot: Secondary | ICD-10-CM

## 2013-10-10 DIAGNOSIS — M948X9 Other specified disorders of cartilage, unspecified sites: Secondary | ICD-10-CM | POA: Insufficient documentation

## 2013-10-10 MED ORDER — PREDNISONE 10 MG PO TABS: ORAL_TABLET | ORAL | Status: DC

## 2013-10-10 NOTE — Progress Notes (Signed)
Quick Note:  Tell patient that x ray shows no acute abnormality. ______ 

## 2013-10-10 NOTE — Progress Notes (Signed)
Quick Note:  Tell patient that x ray shows no acute abnormality. Just arthritis mild ( and poss her osteopetrosis change) ______

## 2013-10-10 NOTE — Progress Notes (Signed)
Chief Complaint  Patient presents with  . Foot Pain    Both right and left.  Started two - three days ago.  Rt foot is worse than the left.  There is redness and swelling.    HPI: Patient comes in today for SDA for   problem evaluation. Initially hand swelling and tenderness in her left index finger seen by Dr. Alma Friendly PA and given Keflex 4 probable infection. She then developed left-sided pain and was told she had pleurisy biped Lucile Crater and given prednisone. Was also given doxycycline for her finger. The pleuritic pain got better the finger with continued the orthopedics office to refill the doxycycline and it is getting better. She was noted to have swelling in her left ring finger that was felt to be arthritis.  In this setting over the last 3-4 days she had swelling tenderness sensitivity to the right great toe joint. No associated fever used topical Voltaren with some help but is continued very painful denies any specific fall or injury recently.  Decided to come in because of all of these things together in case they're related. Last dose of doxycycline about 2-3 days ago. ROS: See pertinent positives and negatives per HPI. Remote history of stress fracture of left foot no current chest pain shortness of breath. No fever chills no history of psoriasis.  Past Medical History  Diagnosis Date  . Hypertension   . Hypothyroid   . Obesity   . Post-menopausal bleeding   . Arthritis   . B12 deficiency   . CVA (cerebral infarction) 2 19 2012     r frontal  thrombotic    . LVH (left ventricular hypertrophy)      by echo 2012  . Atrial fibrillation   . Stroke     10/2010  . Left leg weakness     r/t stroke 10/2010  . Sleep apnea     no cpap - surgery to removed tonsils/cut down uvula  . Blood transfusion     at pre-op appt 10/2, per pt, no hx of bld transfusion  . Hyperlipidemia     recent labs normal  . Diverticulosis   . Colon polyps   . Osteopetrosis   . Neuromuscular  disorder   . Fatigue   . Stress fracture 10/13    right foot, healed within 3 weeks  . Tendonitis 1/14    left foot    Family History  Problem Relation Age of Onset  . COPD Mother   . Hypertension Mother   . Osteoporosis Mother   . Diabetes Father   . Hypertension Father   . Liver cancer Father   . Hypertension Sister   . Hypertension Brother     History   Social History  . Marital Status: Divorced    Spouse Name: N/A    Number of Children: N/A  . Years of Education: N/A   Social History Main Topics  . Smoking status: Never Smoker   . Smokeless tobacco: Never Used  . Alcohol Use: 0.0 oz/week     Comment: occ  . Drug Use: No  . Sexual Activity: No   Other Topics Concern  . 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 atenolol (TENORMIN) 50  MG tablet TAKE 1 TABLET BY MOUTH IN THE MORNING AND 1/2 TABLET AT NIGHT AS DIRECTED  . diclofenac sodium (VOLTAREN) 1 % GEL Apply topically.    . fish oil-omega-3 fatty acids 1000 MG capsule Take 2 g by mouth daily.    . furosemide (LASIX) 20 MG tablet TAKE 1 TABLET BY MOUTH EVERY DAY  . gabapentin (NEURONTIN) 300 MG capsule TAKE 1 CAPSULE (300 MG TOTAL) BY MOUTH DAILY.  Marland Kitchen levothyroxine (SYNTHROID, LEVOTHROID) 75 MCG tablet TAKE 1 TABLET BY MOUTH EVERY DAY  . lisinopril-hydrochlorothiazide (PRINZIDE,ZESTORETIC) 20-25 MG per tablet TAKE 1 TABLET BY MOUTH ONCE DAILY  . Rivaroxaban (XARELTO PO) Take 20 mg by mouth.   . simvastatin (ZOCOR) 20 MG tablet TAKE 1 TABLET BY MOUTH EVERY EVENING  . Vitamin D, Ergocalciferol, (DRISDOL) 50000 UNITS CAPS capsule TAKE 1 CAPSULE BY MOUTH EVERY WEEK  . predniSONE (DELTASONE) 10 MG tablet Take pills per day,6,6,6,4,4,4,2,2,2,1,1,1  . [DISCONTINUED] doxycycline (VIBRA-TABS) 100 MG tablet Take 1 tablet (100 mg total) by mouth 2  (two) times daily.    EXAM:  BP 116/70  Temp(Src) 98.7 F (37.1 C) (Oral)  Ht 5' 1.5" (1.562 m)  LMP 05/06/2013  Body mass index is 0.00 kg/(m^2).  GENERAL: vitals reviewed and listed above, alert, oriented, appears well hydrated and in no acute distress HEENT: atraumatic, conjunctiva  clear, no obvious abnormalities on inspection of external nose and ears  MS: moves all extremities using a cane but ambulatory. Right MTP joint +1 redness swelling a bit of warmth tenderness but not excruciating no foot ulcers or calluses. Neurovascular otherwise seems adequate. Left foot no acute swelling Left hand ring finger some deformity mild redness minimally tender PIP PIP joint area good range of motion Left index finger diffuse swelling down to the PIP almost a sausage swelling no acute rashes Abscess discharge. PSYCH: pleasant and cooperative, no obvious depression or anxiety  ASSESSMENT AND PLAN:  Discussed the following assessment and plan:  Foot pain, right - Plan: DG Foot Complete Right  Finger swelling - Plan: DG Finger Index Left, DG Finger Ring Left Suspect gout on the right foot without history of same she has had stress fractures in a couple of other orthopedic problems but this does not seem to be from injury. Is been treated for it digital skin infections but some of her swelling looks arthritic. Would get finger x-rays foot x-ray treated his gout prednisone because risk of NSAIDs too great. Consider colchicine if needed. Close followup warranted. Plan laboratory studies such as uric acid renal function CBC etc. a followup visit.  Discussed gout in the differential diagnosis. -Patient advised to return or notify health care team  if symptoms worsen or persist or new concerns arise.  Patient Instructions   Suspecting gout in foot . Even possible in fingers at times. Get x ray foot and left fingers involved  Prednisone ( ok to use topical voltaren if helps) as we discussed    Plan fu in 2 weeks for lab and other evaluation.  Consider seeing rheumatology if  persistent or progressive    Gout Gout is an inflammatory arthritis caused by a buildup of uric acid crystals in the joints. Uric acid is a chemical that is normally present in the blood. When the level of uric acid in the blood is too high it can form crystals that deposit in your joints and tissues. This causes joint redness, soreness, and swelling (inflammation). Repeat attacks are common. Over time, uric acid crystals can  form into masses (tophi) near a joint, destroying bone and causing disfigurement. Gout is treatable and often preventable. CAUSES  The disease begins with elevated levels of uric acid in the blood. Uric acid is produced by your body when it breaks down a naturally found substance called purines. Certain foods you eat, such as meats and fish, contain high amounts of purines. Causes of an elevated uric acid level include:  Being passed down from parent to child (heredity).  Diseases that cause increased uric acid production (such as obesity, psoriasis, and certain cancers).  Excessive alcohol use.  Diet, especially diets rich in meat and seafood.  Medicines, including certain cancer-fighting medicines (chemotherapy), water pills (diuretics), and aspirin.  Chronic kidney disease. The kidneys are no longer able to remove uric acid well.  Problems with metabolism. Conditions strongly associated with gout include:  Obesity.  High blood pressure.  High cholesterol.  Diabetes. Not everyone with elevated uric acid levels gets gout. It is not understood why some people get gout and others do not. Surgery, joint injury, and eating too much of certain foods are some of the factors that can lead to gout attacks. SYMPTOMS   An attack of gout comes on quickly. It causes intense pain with redness, swelling, and warmth in a joint.  Fever can occur.  Often, only one joint is involved.  Certain joints are more commonly involved:  Base of the big toe.  Knee.  Ankle.  Wrist.  Finger. Without treatment, an attack usually goes away in a few days to weeks. Between attacks, you usually will not have symptoms, which is different from many other forms of arthritis. DIAGNOSIS  Your caregiver will suspect gout based on your symptoms and exam. In some cases, tests may be recommended. The tests may include:  Blood tests.  Urine tests.  X-rays.  Joint fluid exam. This exam requires a needle to remove fluid from the joint (arthrocentesis). Using a microscope, gout is confirmed when uric acid crystals are seen in the joint fluid. TREATMENT  There are two phases to gout treatment: treating the sudden onset (acute) attack and preventing attacks (prophylaxis).  Treatment of an Acute Attack.  Medicines are used. These include anti-inflammatory medicines or steroid medicines.  An injection of steroid medicine into the affected joint is sometimes necessary.  The painful joint is rested. Movement can worsen the arthritis.  You may use warm or cold treatments on painful joints, depending which works best for you.  Treatment to Prevent Attacks.  If you suffer from frequent gout attacks, your caregiver may advise preventive medicine. These medicines are started after the acute attack subsides. These medicines either help your kidneys eliminate uric acid from your body or decrease your uric acid production. You may need to stay on these medicines for a very long time.  The early phase of treatment with preventive medicine can be associated with an increase in acute gout attacks. For this reason, during the first few months of treatment, your caregiver may also advise you to take medicines usually used for acute gout treatment. Be sure you understand your caregiver's directions. Your caregiver may make several adjustments to your medicine dose before these medicines are  effective.  Discuss dietary treatment with your caregiver or dietitian. Alcohol and drinks high in sugar and fructose and foods such as meat, poultry, and seafood can increase uric acid levels. Your caregiver or dietician can advise you on drinks and foods that should be limited. HOME CARE INSTRUCTIONS  Do not take aspirin to relieve pain. This raises uric acid levels.  Only take over-the-counter or prescription medicines for pain, discomfort, or fever as directed by your caregiver.  Rest the joint as much as possible. When in bed, keep sheets and blankets off painful areas.  Keep the affected joint raised (elevated).  Apply warm or cold treatments to painful joints. Use of warm or cold treatments depends on which works best for you.  Use crutches if the painful joint is in your leg.  Drink enough fluids to keep your urine clear or pale yellow. This helps your body get rid of uric acid. Limit alcohol, sugary drinks, and fructose drinks.  Follow your dietary instructions. Pay careful attention to the amount of protein you eat. Your daily diet should emphasize fruits, vegetables, whole grains, and fat-free or low-fat milk products. Discuss the use of coffee, vitamin C, and cherries with your caregiver or dietician. These may be helpful in lowering uric acid levels.  Maintain a healthy body weight. SEEK MEDICAL CARE IF:   You develop diarrhea, vomiting, or any side effects from medicines.  You do not feel better in 24 hours, or you are getting worse. SEEK IMMEDIATE MEDICAL CARE IF:   Your joint becomes suddenly more tender, and you have chills or a fever. MAKE SURE YOU:   Understand these instructions.  Will watch your condition.  Will get help right away if you are not doing well or get worse. Document Released: 08/19/2000 Document Revised: 12/17/2012 Document Reviewed: 04/04/2012 [facility] Patient Information 2014 [place].     Standley Brooking. Chaise Mahabir M.D.  Pre visit review  using our clinic review tool, if applicable. No additional management support is needed unless otherwise documented below in the visit note.

## 2013-10-10 NOTE — Progress Notes (Signed)
Quick Note:  Tell patient that x ray shows no acute abnormality. ______

## 2013-10-10 NOTE — Patient Instructions (Signed)
Suspecting gout in foot . Even possible in fingers at times. Get x ray foot and left fingers involved  Prednisone ( ok to use topical voltaren if helps) as we discussed  Plan fu in 2 weeks for lab and other evaluation.  Consider seeing rheumatology if  persistent or progressive    Gout Gout is an inflammatory arthritis caused by a buildup of uric acid crystals in the joints. Uric acid is a chemical that is normally present in the blood. When the level of uric acid in the blood is too high it can form crystals that deposit in your joints and tissues. This causes joint redness, soreness, and swelling (inflammation). Repeat attacks are common. Over time, uric acid crystals can form into masses (tophi) near a joint, destroying bone and causing disfigurement. Gout is treatable and often preventable. CAUSES  The disease begins with elevated levels of uric acid in the blood. Uric acid is produced by your body when it breaks down a naturally found substance called purines. Certain foods you eat, such as meats and fish, contain high amounts of purines. Causes of an elevated uric acid level include:  Being passed down from parent to child (heredity).  Diseases that cause increased uric acid production (such as obesity, psoriasis, and certain cancers).  Excessive alcohol use.  Diet, especially diets rich in meat and seafood.  Medicines, including certain cancer-fighting medicines (chemotherapy), water pills (diuretics), and aspirin.  Chronic kidney disease. The kidneys are no longer able to remove uric acid well.  Problems with metabolism. Conditions strongly associated with gout include:  Obesity.  High blood pressure.  High cholesterol.  Diabetes. Not everyone with elevated uric acid levels gets gout. It is not understood why some people get gout and others do not. Surgery, joint injury, and eating too much of certain foods are some of the factors that can lead to gout attacks. SYMPTOMS     An attack of gout comes on quickly. It causes intense pain with redness, swelling, and warmth in a joint.  Fever can occur.  Often, only one joint is involved. Certain joints are more commonly involved:  Base of the big toe.  Knee.  Ankle.  Wrist.  Finger. Without treatment, an attack usually goes away in a few days to weeks. Between attacks, you usually will not have symptoms, which is different from many other forms of arthritis. DIAGNOSIS  Your caregiver will suspect gout based on your symptoms and exam. In some cases, tests may be recommended. The tests may include:  Blood tests.  Urine tests.  X-rays.  Joint fluid exam. This exam requires a needle to remove fluid from the joint (arthrocentesis). Using a microscope, gout is confirmed when uric acid crystals are seen in the joint fluid. TREATMENT  There are two phases to gout treatment: treating the sudden onset (acute) attack and preventing attacks (prophylaxis).  Treatment of an Acute Attack.  Medicines are used. These include anti-inflammatory medicines or steroid medicines.  An injection of steroid medicine into the affected joint is sometimes necessary.  The painful joint is rested. Movement can worsen the arthritis.  You may use warm or cold treatments on painful joints, depending which works best for you.  Treatment to Prevent Attacks.  If you suffer from frequent gout attacks, your caregiver may advise preventive medicine. These medicines are started after the acute attack subsides. These medicines either help your kidneys eliminate uric acid from your body or decrease your uric acid production. You may need to  stay on these medicines for a very long time.  The early phase of treatment with preventive medicine can be associated with an increase in acute gout attacks. For this reason, during the first few months of treatment, your caregiver may also advise you to take medicines usually used for acute gout  treatment. Be sure you understand your caregiver's directions. Your caregiver may make several adjustments to your medicine dose before these medicines are effective.  Discuss dietary treatment with your caregiver or dietitian. Alcohol and drinks high in sugar and fructose and foods such as meat, poultry, and seafood can increase uric acid levels. Your caregiver or dietician can advise you on drinks and foods that should be limited. HOME CARE INSTRUCTIONS   Do not take aspirin to relieve pain. This raises uric acid levels.  Only take over-the-counter or prescription medicines for pain, discomfort, or fever as directed by your caregiver.  Rest the joint as much as possible. When in bed, keep sheets and blankets off painful areas.  Keep the affected joint raised (elevated).  Apply warm or cold treatments to painful joints. Use of warm or cold treatments depends on which works best for you.  Use crutches if the painful joint is in your leg.  Drink enough fluids to keep your urine clear or pale yellow. This helps your body get rid of uric acid. Limit alcohol, sugary drinks, and fructose drinks.  Follow your dietary instructions. Pay careful attention to the amount of protein you eat. Your daily diet should emphasize fruits, vegetables, whole grains, and fat-free or low-fat milk products. Discuss the use of coffee, vitamin C, and cherries with your caregiver or dietician. These may be helpful in lowering uric acid levels.  Maintain a healthy body weight. SEEK MEDICAL CARE IF:   You develop diarrhea, vomiting, or any side effects from medicines.  You do not feel better in 24 hours, or you are getting worse. SEEK IMMEDIATE MEDICAL CARE IF:   Your joint becomes suddenly more tender, and you have chills or a fever. MAKE SURE YOU:   Understand these instructions.  Will watch your condition.  Will get help right away if you are not doing well or get worse. Document Released: 08/19/2000  Document Revised: 12/17/2012 Document Reviewed: 04/04/2012 [facility] Patient Information 2014 [place].

## 2013-10-11 ENCOUNTER — Other Ambulatory Visit: Payer: Self-pay | Admitting: Internal Medicine

## 2013-10-11 MED ORDER — LISINOPRIL-HYDROCHLOROTHIAZIDE 20-25 MG PO TABS: ORAL_TABLET | ORAL | Status: DC

## 2013-10-18 ENCOUNTER — Encounter: Payer: Self-pay | Admitting: Internal Medicine

## 2013-10-18 ENCOUNTER — Other Ambulatory Visit: Payer: Self-pay | Admitting: Sports Medicine

## 2013-10-18 ENCOUNTER — Other Ambulatory Visit: Payer: Self-pay | Admitting: *Deleted

## 2013-10-18 MED ORDER — GABAPENTIN 300 MG PO CAPS: 300.0000 mg | ORAL_CAPSULE | Freq: Every day | ORAL | Status: DC

## 2013-10-23 ENCOUNTER — Telehealth: Payer: Self-pay | Admitting: Internal Medicine

## 2013-10-23 NOTE — Telephone Encounter (Signed)
worried that she may mot be able to get into the office tomorrow am. Pt has severe upper respiratory, deep cough, wheezing, hard to catch breath when she starts coughing, would like to come in this afternoon if possible. pls advise!!

## 2013-10-23 NOTE — Telephone Encounter (Signed)
Spoke to the pt.  She complains of   Cough that is productive.  Brown sputum this morning and yellow this afternoon  Thinks she may have some wheezing  Sneezing  Unknown if she has a fever  Nasal congestion  States her sister had the same.  Was given a z-pak and is now better.

## 2013-10-23 NOTE — Telephone Encounter (Signed)
Pt informed dr Regis Bill has no appts this afternoon per misty. Will wait until the am. Pt refused triage nurse. Wants to wait for dr Regis Bill. Refused another location.

## 2013-10-24 ENCOUNTER — Ambulatory Visit (INDEPENDENT_AMBULATORY_CARE_PROVIDER_SITE_OTHER): Payer: Medicare HMO | Admitting: Internal Medicine

## 2013-10-24 ENCOUNTER — Encounter: Payer: Self-pay | Admitting: Internal Medicine

## 2013-10-24 VITALS — BP 112/60 | HR 94 | Temp 99.0°F | Ht 61.5 in

## 2013-10-24 DIAGNOSIS — J209 Acute bronchitis, unspecified: Secondary | ICD-10-CM

## 2013-10-24 DIAGNOSIS — Z8673 Personal history of transient ischemic attack (TIA), and cerebral infarction without residual deficits: Secondary | ICD-10-CM

## 2013-10-24 DIAGNOSIS — M7989 Other specified soft tissue disorders: Secondary | ICD-10-CM

## 2013-10-24 DIAGNOSIS — Z7901 Long term (current) use of anticoagulants: Secondary | ICD-10-CM

## 2013-10-24 MED ORDER — HYDROCODONE-HOMATROPINE 5-1.5 MG/5ML PO SYRP
5.0000 mL | ORAL_SOLUTION | ORAL | Status: DC | PRN
Start: 2013-10-24 — End: 2013-11-12

## 2013-10-24 MED ORDER — AZITHROMYCIN 250 MG PO TABS: 250.0000 mg | ORAL_TABLET | ORAL | Status: DC

## 2013-10-24 NOTE — Telephone Encounter (Signed)
Seen in office today

## 2013-10-24 NOTE — Patient Instructions (Signed)
This may be a bacterial bronchitis.   Antibiotic and cough med for comfort  Cough could last another 1-2 weeks but dark phlegm should clear.  Soon in the next 3-5 days .

## 2013-10-24 NOTE — Progress Notes (Signed)
Chief Complaint  Patient presents with  . Cough    Started on Sunday  . Nasal Congestion  . Sore Throat    HPI: Patient comes in today for  new problem evaluation. message last pm phone message . Onset 5 days ago with sore throat. After trip to [place]  To help with grand child  . Got home 2 day ago.  Slight better than yesterday. Still phlegm green   [place] also   Never was a dry cough  No fever . But got scared and couldn't catch breath.  get breath with coughing.   ? Laryngospasm? ? Stridor  Just got off pred no  joints   Better still poin and nodule on finger and has appt with hand doc soon. got better ? Gout x ray was neg except for djd arthritis   ROS: See pertinent positives and negatives per HPI. No active bleeding bruising no current stridor   Past Medical History  Diagnosis Date  . Hypertension   . Hypothyroid   . Obesity   . Post-menopausal bleeding   . Arthritis   . B12 deficiency   . CVA (cerebral infarction) 2 19 2012     r frontal  thrombotic    . LVH (left ventricular hypertrophy)      by echo 2012  . Atrial fibrillation   . Stroke     10/2010  . Left leg weakness     r/t stroke 10/2010  . Sleep apnea     no cpap - surgery to removed tonsils/cut down uvula  . Blood transfusion     at pre-op appt 10/2, per pt, no hx of bld transfusion  . Hyperlipidemia     recent labs normal  . Diverticulosis   . Colon polyps   . Osteopetrosis   . Neuromuscular disorder   . Fatigue   . Stress fracture 10/13    right foot, healed within 3 weeks  . Tendonitis 1/14    left foot    Family History  Problem Relation Age of Onset  . COPD Mother   . Hypertension Mother   . Osteoporosis Mother   . Diabetes Father   . Hypertension Father   . Liver cancer Father   . Hypertension Sister   . Hypertension Brother     History   Social History  . Marital Status: Divorced    Spouse Name: N/A    Number of Children: N/A  . Years of Education: N/A   Social History  Main Topics  . Smoking status: Never Smoker   . Smokeless tobacco: Never Used  . Alcohol Use: 0.0 oz/week     Comment: occ  . Drug Use: No  . Sexual Activity: No   Other Topics Concern  . 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 atenolol (TENORMIN) 50 MG tablet TAKE 1 TABLET BY MOUTH IN THE MORNING AND 1/2 TABLET AT NIGHT AS DIRECTED  . diclofenac sodium (VOLTAREN) 1 % GEL Apply topically.    . fish oil-omega-3 fatty acids 1000 MG capsule Take 2 g by mouth daily.    . furosemide (LASIX) 20 MG tablet TAKE 1 TABLET BY MOUTH EVERY DAY  . gabapentin (NEURONTIN) 300 MG capsule Take 1 capsule (  300 mg total) by mouth daily.  Marland Kitchen levothyroxine (SYNTHROID, LEVOTHROID) 75 MCG tablet TAKE 1 TABLET BY MOUTH EVERY DAY  . lisinopril-hydrochlorothiazide (PRINZIDE,ZESTORETIC) 20-25 MG per tablet TAKE 1 TABLET BY MOUTH ONCE DAILY  . Rivaroxaban (XARELTO PO) Take 20 mg by mouth.   . simvastatin (ZOCOR) 20 MG tablet TAKE 1 TABLET BY MOUTH EVERY EVENING  . azithromycin (ZITHROMAX Z-PAK) 250 MG tablet Take 1 tablet (250 mg total) by mouth as directed. Take 2 po first day, then 1 po qd  . HYDROcodone-homatropine (HYCODAN) 5-1.5 MG/5ML syrup Take 5 mLs by mouth every 4 (four) hours as needed for cough.  . [DISCONTINUED] predniSONE (DELTASONE) 10 MG tablet Take pills per day,6,6,6,4,4,4,2,2,2,1,1,1  . [DISCONTINUED] Vitamin D, Ergocalciferol, (DRISDOL) 50000 UNITS CAPS capsule TAKE 1 CAPSULE BY MOUTH EVERY WEEK    EXAM:  BP 112/60  Pulse 94  Temp(Src) 99 F (37.2 C) (Oral)  Ht 5' 1.5" (1.562 m)  SpO2 96%  LMP 05/06/2013  Body mass index is 0.00 kg/(m^2). WDWN in NAD  quiet respirations; mildly congested  somewhat hoarse. Non toxic . HEENT: Normocephalic ;atraumatic , Eyes;  PERRL, EOMs  Full, lids and conjunctiva clear,,Ears: no  deformities, canals nl, TM landmarks normal, Nose: no deformity or discharge but congested;face non tender Mouth : OP clear without lesion or edema . Neck: Supple without adenopathy or masses or bruits Chest:  Clear to A&P without wheezes rales or rhonchi CV:  S1-S2 no gallops or murmurs peripheral perfusion is normal Skin :nl perfusion and no acute rashes  Independent gait with cane  PSYCH: pleasant and cooperative, no obvious depression or anxiety  ASSESSMENT AND PLAN:  Discussed the following assessment and plan:  Acute bronchitis - vir vs bacterial but with brown phelgm and severity just off pred add antibiotic  fu no ob pna today   Chronic anticoagulation  Finger swelling - improved  l;ef tindex persists   H/O: CVA (cerebrovascular accident) Joint swelling beter  Uncertain cause  Atypical  Poss was gout in foot  -Patient advised to return or notify health care team  if symptoms worsen or persist or new concerns arise.  Patient Instructions  This may be a bacterial bronchitis.   Antibiotic and cough med for comfort  Cough could last another 1-2 weeks but dark phlegm should clear.  Soon in the next 3-5 days .    [name]. Panosh M.D.  Pre visit review using our clinic review tool, if applicable. No additional management support is needed unless otherwise documented below in the visit note.

## 2013-11-12 ENCOUNTER — Encounter: Payer: Self-pay | Admitting: Cardiovascular Disease

## 2013-11-12 ENCOUNTER — Ambulatory Visit (INDEPENDENT_AMBULATORY_CARE_PROVIDER_SITE_OTHER): Payer: Medicare HMO | Admitting: Cardiovascular Disease

## 2013-11-12 ENCOUNTER — Ambulatory Visit: Payer: Medicare HMO | Admitting: Cardiovascular Disease

## 2013-11-12 VITALS — BP 138/86 | HR 75 | Ht 62.0 in | Wt 312.3 lb

## 2013-11-12 DIAGNOSIS — E785 Hyperlipidemia, unspecified: Secondary | ICD-10-CM

## 2013-11-12 DIAGNOSIS — I4891 Unspecified atrial fibrillation: Secondary | ICD-10-CM

## 2013-11-12 DIAGNOSIS — I482 Chronic atrial fibrillation, unspecified: Secondary | ICD-10-CM

## 2013-11-12 DIAGNOSIS — I10 Essential (primary) hypertension: Secondary | ICD-10-CM

## 2013-11-12 NOTE — Assessment & Plan Note (Signed)
Under good control on current medications

## 2013-11-12 NOTE — Assessment & Plan Note (Signed)
On statin therapy with lipid profile measured by her PCP date//14 revealed a total cholesterol of 131 , LDL 64 and HDL of 45

## 2013-11-12 NOTE — Progress Notes (Signed)
11/12/2013 [name]   1951-07-21  [id number]  Primary Physician Stacy Dawson, MD Primary Cardiologist: Stacy Harp MD [name]   HPI:  The patient is a 63 year old, morbidly overweight, divorced Caucasian female, mother of 61, grandmother to 2 grandchildren who I last saw in the office 5 months ago. She has a history of hypertension and hyperlipidemia. She has had a stroke in the past and has paroxysmal A-fib now in chronic A-fib on Xarelto anticoagulation. She is rate controlled. She has had an uvulopalatopharyngoplasty which improved her symptoms of sleep apnea. Dr. Shanon [name] follows her lipid profile and apparently recently stopped her Zocor. A 2D echo showed normal LV function with mild left atrial enlargement and a Myoview stress test performed December 08, 2010, was low risk. She is otherwise asymptomaticshe is on lovastatin therapy with excellent profile for primary prevention performed 04/09/13 by her PCP. Otherwise, she is asymptomatic and specifically denies chest pain or shortness of breath.  Current Outpatient Prescriptions  Medication Sig Dispense Refill  . atenolol (TENORMIN) 50 MG tablet TAKE 1 TABLET BY MOUTH IN THE MORNING AND 1/2 TABLET AT NIGHT AS DIRECTED  135 tablet  1  . diclofenac sodium (VOLTAREN) 1 % GEL Apply topically.        . fish oil-omega-3 fatty acids 1000 MG capsule Take 2 g by mouth daily.        . furosemide (LASIX) 20 MG tablet Take 20 mg by mouth as needed. TAKE 1 TABLET BY MOUTH EVERY DAY      . gabapentin (NEURONTIN) 300 MG capsule Take 1 capsule (300 mg total) by mouth daily.  90 capsule  0  . levothyroxine (SYNTHROID, LEVOTHROID) 75 MCG tablet TAKE 1 TABLET BY MOUTH EVERY DAY  90 tablet  1  . lisinopril-hydrochlorothiazide (PRINZIDE,ZESTORETIC) 20-25 MG per tablet TAKE 1 TABLET BY MOUTH ONCE DAILY  90 tablet  1  . Rivaroxaban (XARELTO PO) Take 20 mg by mouth.       . simvastatin (ZOCOR) 20 MG tablet TAKE 1 TABLET BY MOUTH  EVERY EVENING  90 tablet  1  . [DISCONTINUED] TOPAMAX 50 MG tablet Take 50 mg by mouth.       Current Facility-Administered Medications  Medication Dose Route Frequency Provider Last Rate Last Dose  . cyanocobalamin ((VITAMIN B-12)) injection 1,000 mcg  1,000 mcg Intramuscular Once [name], MD        Allergies  Allergen Reactions  . Ambien [Zolpidem Tartrate]     Amnesia and fall   . Prevacid [Lansoprazole] Swelling  . [name] Inhibitors Cough  . Benadryl [Diphenhydramine Hcl]     topical  . Losartan     Elevated creatinine and swelling  . Motrin [Ibuprofen] Other (See Comments)    ANKLE EDEMA   . Prilosec [Omeprazole]     Swelling in ankles.    History   Social History  . Marital Status: Divorced    Spouse Name: N/A    Number of Children: N/A  . Years of Education: N/A   Occupational History  . Not on file.   Social History Main Topics  . Smoking status: Never Smoker   . Smokeless tobacco: Never Used  . Alcohol Use: 0.0 oz/week     Comment: occ  . Drug Use: No  . Sexual Activity: No   Other Topics Concern  . 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  Cardiovascular: negative for chest pain, dyspnea on exertion, edema, orthopnea, palpitations, paroxysmal nocturnal dyspnea or shortness of breath Dermatological: negative for rash Respiratory: negative for cough or wheezing Urologic: negative for hematuria Abdominal: negative for nausea, vomiting, diarrhea, bright red blood per rectum, melena, or hematemesis Neurologic: negative for visual changes, syncope, or dizziness All other systems reviewed and are otherwise negative except as noted above.    Blood pressure 138/86, pulse 75, height _0  (1.575 m), weight 312 lb 4.8 oz (141.658 kg), last menstrual period  05/06/2013.  General appearance: alert and no distress Neck: no adenopathy, no carotid bruit, no JVD, supple, symmetrical, trachea midline and thyroid not enlarged, symmetric, no tenderness/mass/nodules Lungs: clear to auscultation bilaterally Heart: irregularly irregular rhythm Extremities: extremities normal, atraumatic, no cyanosis or edema  EKG atrial fibrillation with a ventricular spots of 75  ASSESSMENT AND PLAN:   Hypertension Under good control on current medications  HYPERLIPIDEMIA On statin therapy with lipid profile measured by her PCP date//14 revealed a total cholesterol of 131 , LDL 64 and HDL of 45  Chronic atrial fibrillation Rate controlled on Xarelto  oral anticoagulation      Stacy Harp MD [facility], [facility] 11/12/2013 2:17 PM

## 2013-11-12 NOTE — Patient Instructions (Signed)
Your physician wants you to follow-up in: 6 months with Kerin Ransom [facility] and 1 year with Dr Gwenlyn Found.  You will receive a reminder letter in the mail two months in advance. If you don't receive a letter, please call our office to schedule the follow-up appointment.   Dr Gwenlyn Found has cleared you to hold your Xarelto 2 days prior to your spinal injection and colonoscopy.  Resume the Xarelto after the procedure(s).

## 2013-11-12 NOTE — Assessment & Plan Note (Signed)
Rate controlled on Xarelto  oral anticoagulation

## 2013-11-14 ENCOUNTER — Ambulatory Visit: Payer: Medicare HMO | Admitting: Cardiovascular Disease

## 2013-11-27 ENCOUNTER — Ambulatory Visit (INDEPENDENT_AMBULATORY_CARE_PROVIDER_SITE_OTHER): Payer: Commercial Managed Care - HMO | Admitting: Internal Medicine

## 2013-11-27 ENCOUNTER — Encounter: Payer: Self-pay | Admitting: Internal Medicine

## 2013-11-27 VITALS — BP 110/64 | HR 79 | Temp 97.7°F | Ht 61.5 in

## 2013-11-27 DIAGNOSIS — E538 Deficiency of other specified B group vitamins: Secondary | ICD-10-CM

## 2013-11-27 DIAGNOSIS — I10 Essential (primary) hypertension: Secondary | ICD-10-CM

## 2013-11-27 DIAGNOSIS — Z7901 Long term (current) use of anticoagulants: Secondary | ICD-10-CM

## 2013-11-27 DIAGNOSIS — M549 Dorsalgia, unspecified: Secondary | ICD-10-CM

## 2013-11-27 DIAGNOSIS — M109 Gout, unspecified: Secondary | ICD-10-CM

## 2013-11-27 MED ORDER — ALLOPURINOL 100 MG PO TABS: 100.0000 mg | ORAL_TABLET | Freq: Every day | ORAL | Status: DC

## 2013-11-27 MED ORDER — COLCHICINE 0.6 MG PO TABS: 0.6000 mg | ORAL_TABLET | Freq: Two times a day (BID) | ORAL | Status: DC

## 2013-11-27 MED ORDER — CYANOCOBALAMIN 1000 MCG/ML IJ SOLN
1000.0000 ug | Freq: Once | INTRAMUSCULAR | Status: AC
  Administered 2013-11-27: 1000 ug via INTRAMUSCULAR

## 2013-11-27 NOTE — Patient Instructions (Addendum)
Begin anti-inflammatory colchicine twice a day for week until all inflammation is gone once a day might be enough to suppress inflammation.  Then begin allopurinol as directed 100 mg a day for a week 200 mg a day for another week and 300 mg a day for the third week.  Plan recheck uric acid level (blood count and kidney function) in one to 2 months  Add on to the CPX labs at next visitbefore your next visit. Contact us in the meantime if we cannot follow through with the plan.  Gout Gout is an inflammatory arthritis caused by a buildup of uric acid crystals in the joints. Uric acid is a chemical that is normally present in the blood. When the level of uric acid in the blood is too high it can form crystals that deposit in your joints and tissues. This causes joint redness, soreness, and swelling (inflammation). Repeat attacks are common. Over time, uric acid crystals can form into masses (tophi) near a joint, destroying bone and causing disfigurement. Gout is treatable and often preventable. CAUSES  The disease begins with elevated levels of uric acid in the blood. Uric acid is produced by your body when it breaks down a naturally found substance called purines. Certain foods you eat, such as meats and fish, contain high amounts of purines. Causes of an elevated uric acid level include:  Being passed down from parent to child (heredity).  Diseases that cause increased uric acid production (such as obesity, psoriasis, and certain cancers).  Excessive alcohol use.  Diet, especially diets rich in meat and seafood.  Medicines, including certain cancer-fighting medicines (chemotherapy), water pills (diuretics), and aspirin.  Chronic kidney disease. The kidneys are no longer able to remove uric acid well.  Problems with metabolism. Conditions strongly associated with gout include:  Obesity.  High blood pressure.  High cholesterol.  Diabetes. Not everyone with elevated uric acid levels gets  gout. It is not understood why some people get gout and others do not. Surgery, joint injury, and eating too much of certain foods are some of the factors that can lead to gout attacks. SYMPTOMS   An attack of gout comes on quickly. It causes intense pain with redness, swelling, and warmth in a joint.  Fever can occur.  Often, only one joint is involved. Certain joints are more commonly involved:  Base of the big toe.  Knee.  Ankle.  Wrist.  Finger. Without treatment, an attack usually goes away in a few days to weeks. Between attacks, you usually will not have symptoms, which is different from many other forms of arthritis. DIAGNOSIS  Your caregiver will suspect gout based on your symptoms and exam. In some cases, tests may be recommended. The tests may include:  Blood tests.  Urine tests.  X-rays.  Joint fluid exam. This exam requires a needle to remove fluid from the joint (arthrocentesis). Using a microscope, gout is confirmed when uric acid crystals are seen in the joint fluid. TREATMENT  There are two phases to gout treatment: treating the sudden onset (acute) attack and preventing attacks (prophylaxis).  Treatment of an Acute Attack.  Medicines are used. These include anti-inflammatory medicines or steroid medicines.  An injection of steroid medicine into the affected joint is sometimes necessary.  The painful joint is rested. Movement can worsen the arthritis.  You may use warm or cold treatments on painful joints, depending which works best for you.  Treatment to Prevent Attacks.  If you suffer from frequent gout  attacks, your caregiver may advise preventive medicine. These medicines are started after the acute attack subsides. These medicines either help your kidneys eliminate uric acid from your body or decrease your uric acid production. You may need to stay on these medicines for a very long time.  The early phase of treatment with preventive medicine can be  associated with an increase in acute gout attacks. For this reason, during the first few months of treatment, your caregiver may also advise you to take medicines usually used for acute gout treatment. Be sure you understand your caregiver's directions. Your caregiver may make several adjustments to your medicine dose before these medicines are effective.  Discuss dietary treatment with your caregiver or dietitian. Alcohol and drinks high in sugar and fructose and foods such as meat, poultry, and seafood can increase uric acid levels. Your caregiver or dietician can advise you on drinks and foods that should be limited. HOME CARE INSTRUCTIONS   Do not take aspirin to relieve pain. This raises uric acid levels.  Only take over-the-counter or prescription medicines for pain, discomfort, or fever as directed by your caregiver.  Rest the joint as much as possible. When in bed, keep sheets and blankets off painful areas.  Keep the affected joint raised (elevated).  Apply warm or cold treatments to painful joints. Use of warm or cold treatments depends on which works best for you.  Use crutches if the painful joint is in your leg.  Drink enough fluids to keep your urine clear or pale yellow. This helps your body get rid of uric acid. Limit alcohol, sugary drinks, and fructose drinks.  Follow your dietary instructions. Pay careful attention to the amount of protein you eat. Your daily diet should emphasize fruits, vegetables, whole grains, and fat-free or low-fat milk products. Discuss the use of coffee, vitamin C, and cherries with your caregiver or dietician. These may be helpful in lowering uric acid levels.  Maintain a healthy body weight. SEEK MEDICAL CARE IF:   You develop diarrhea, vomiting, or any side effects from medicines.  You do not feel better in 24 hours, or you are getting worse. SEEK IMMEDIATE MEDICAL CARE IF:   Your joint becomes suddenly more tender, and you have chills or a  fever. MAKE SURE YOU:   Understand these instructions.  Will watch your condition.  Will get help right away if you are not doing well or get worse. Document Released: 08/19/2000 Document Revised: 12/17/2012 Document Reviewed: 04/04/2012 [facility] Patient Information 2014 [place].

## 2013-11-27 NOTE — Progress Notes (Signed)
Pre visit review using our clinic review tool, if applicable. No additional management support is needed unless otherwise documented below in the visit note.   Chief Complaint  Patient presents with  . Follow-up    Would like to review lab work.    HPI:  Patient comes in today for an acute visit however it is really to discuss the problem with her joints.   See last visits Saw dr Amedeo Plenty and  Thinks gout a problem  In the  Hands    Gave a shot of finger  Immediately  2 days ago.  On doxycycline. Had courses of prednisone with help but not as much as the injection  Ms. Greening thinks Thinks  Dehydration adding to the issue . He is read a little bit about gout  Father had gout.  Brother also had gout.   Would like a B12 shot today.  She's not taking furosemide at this time  Back pain has facet arthropathy is post injection soon has questions ROS: See pertinent positives and negatives per HPI. No current chest pain shortness of breath falling no unusual bleeding at this time Couldn't take losartan at some point didn't work. No history of psoriasis  Past Medical History  Diagnosis Date  . Hypertension   . Hypothyroid   . Obesity   . Post-menopausal bleeding   . Arthritis   . B12 deficiency   . CVA (cerebral infarction) 2 19 2012     r frontal  thrombotic    . LVH (left ventricular hypertrophy)      by echo 2012  . Atrial fibrillation   . Stroke     10/2010  . Left leg weakness     r/t stroke 10/2010  . Sleep apnea     no cpap - surgery to removed tonsils/cut down uvula  . Blood transfusion     at pre-op appt 10/2, per pt, no hx of bld transfusion  . Hyperlipidemia     recent labs normal  . Diverticulosis   . Colon polyps   . Osteopetrosis   . Neuromuscular disorder   . Fatigue   . Stress fracture 10/13    right foot, healed within 3 weeks  . Tendonitis 1/14    left foot    Family History  Problem Relation Age of Onset  . COPD Mother   . Hypertension Mother   .  Osteoporosis Mother   . Diabetes Father   . Hypertension Father   . Liver cancer Father   . Hypertension Sister   . Hypertension Brother     History   Social History  . Marital Status: Divorced    Spouse Name: N/A    Number of Children: N/A  . Years of Education: N/A   Social History Main Topics  . Smoking status: Never Smoker   . Smokeless tobacco: Never Used  . Alcohol Use: 0.0 oz/week     Comment: occ  . Drug Use: No  . Sexual Activity: No   Other Topics Concern  . 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 atenolol (TENORMIN) 50 MG tablet TAKE 1 TABLET BY MOUTH IN THE MORNING AND 1/2 TABLET AT NIGHT AS DIRECTED  . diclofenac sodium (VOLTAREN) 1 %  GEL Apply topically.    Marland Kitchen doxycycline (ADOXA) 100 MG tablet   . fish oil-omega-3 fatty acids 1000 MG capsule Take 2 g by mouth daily.    . furosemide (LASIX) 20 MG tablet Take 20 mg by mouth as needed. TAKE 1 TABLET BY MOUTH EVERY DAY  . gabapentin (NEURONTIN) 300 MG capsule Take 1 capsule (300 mg total) by mouth daily.  Marland Kitchen levothyroxine (SYNTHROID, LEVOTHROID) 75 MCG tablet TAKE 1 TABLET BY MOUTH EVERY DAY  . lisinopril-hydrochlorothiazide (PRINZIDE,ZESTORETIC) 20-25 MG per tablet TAKE 1 TABLET BY MOUTH ONCE DAILY  . Rivaroxaban (XARELTO PO) Take 20 mg by mouth.   . simvastatin (ZOCOR) 20 MG tablet TAKE 1 TABLET BY MOUTH EVERY EVENING  . traMADol (ULTRAM) 50 MG tablet   . Vitamin D, Ergocalciferol, (DRISDOL) 50000 UNITS CAPS capsule   . allopurinol (ZYLOPRIM) 100 MG tablet Take 1 tablet (100 mg total) by mouth daily. Increase by 100 mg per week until 300 mg per day  . colchicine (COLCRYS) 0.6 MG tablet Take 1 tablet (0.6 mg total) by mouth 2 (two) times daily. For gout  . oxyCODONE (OXY IR/ROXICODONE) 5 MG immediate release tablet     EXAM:  BP 110/64   Pulse 79  Temp(Src) 97.7 F (36.5 C) (Oral)  Ht 5' 1.5" (1.562 m)  SpO2 98%  LMP 05/06/2013  Body mass index is 0.00 kg/(m^2).  GENERAL: vitals reviewed and listed above, alert, oriented, appears well hydrated and in no acute distress HEENT: atraumatic, conjunctiva  clear, no obvious abnormalities on inspection of external nose and ears LUNGS: clear to auscultation bilaterally, no wheezes, rales or rhonchi, good air movement CV: HRRR, no clubbing cyanosis or  peripheral edema nl cap refill  MS: moves all extremities  hands still show some swelling in the right ring finger almost sausagelike I don't feel any tophi. No warmth or redness PSYCH: pleasant and cooperative, no obvious depression or anxiety Reviewed laboratory studies done by Dr. Phillip Heal and uric acid was 9.5 sedimentation rate 34 rheumatoid factor ANA negative CBC normal C. reactive protein elevated at 3.4. ASSESSMENT AND PLAN:  Discussed the following assessment and plan:  Gout - Most likely problematic concerned because of extension recurrence I don't see tophi. Other arthritis possible but will follow  Vitamin B12 deficiency - Plan: cyanocobalamin ((VITAMIN B-12)) injection 1,000 mcg  Chronic anticoagulation  HYPERTENSION  Back pain - Suppose to get injections in back soon asks about advisability. Shouldn't be any interference Risk-benefit of medications needs anti-inflammatory not in and said we'll try colchicine twice a day until things calm down and begin allopurinol and then can go down to one a day Allopurinol as she has normal renal function at last check.  Interaction theoretical of colchicine and statins discussed. Option to get rheumatology involved discussed but will wait at this time. Check uric acid at her [facility] labs in addition to hemoglobin A1c. At this point will remain on same blood pressure medication. Consider stopping the thiazide and substituting if needed -Patient advised to return or notify health  care team  if symptoms worsen ,persist or new concerns arise.  Patient Instructions  Begin anti-inflammatory colchicine twice a day for week until all inflammation is gone once a day might be enough to suppress inflammation.  Then begin allopurinol as directed 100 mg a day for a week 200 mg a day for another week and 300 mg a day for the third week.  Plan recheck uric acid level (blood count and kidney function) in  one to 2 months  Add on to the CP[facility] labs at next visitbefore your next visit. Contact us in the meantime if we cannot follow through with the plan.  Gout Gout is an inflammatory arthritis caused by a buildup of uric acid crystals in the joints. Uric acid is a chemical that is normally present in the blood. When the level of uric acid in the blood is too high it can form crystals that deposit in your joints and tissues. This causes joint redness, soreness, and swelling (inflammation). Repeat attacks are common. Over time, uric acid crystals can form into masses (tophi) near a joint, destroying bone and causing disfigurement. Gout is treatable and often preventable. CAUSES  The disease begins with elevated levels of uric acid in the blood. Uric acid is produced by your body when it breaks down a naturally found substance called purines. Certain foods you eat, such as meats and fish, contain high amounts of purines. Causes of an elevated uric acid level include:  Being passed down from parent to child (heredity).  Diseases that cause increased uric acid production (such as obesity, psoriasis, and certain cancers).  Excessive alcohol use.  Diet, especially diets rich in meat and seafood.  Medicines, including certain cancer-fighting medicines (chemotherapy), water pills (diuretics), and aspirin.  Chronic kidney disease. The kidneys are no longer able to remove uric acid well.  Problems with metabolism. Conditions strongly associated with gout include:  Obesity.  High blood  pressure.  High cholesterol.  Diabetes. Not everyone with elevated uric acid levels gets gout. It is not understood why some people get gout and others do not. Surgery, joint injury, and eating too much of certain foods are some of the factors that can lead to gout attacks. SYMPTOMS   An attack of gout comes on quickly. It causes intense pain with redness, swelling, and warmth in a joint.  Fever can occur.  Often, only one joint is involved. Certain joints are more commonly involved:  Base of the big toe.  Knee.  Ankle.  Wrist.  Finger. Without treatment, an attack usually goes away in a few days to weeks. Between attacks, you usually will not have symptoms, which is different from many other forms of arthritis. DIAGNOSIS  Your caregiver will suspect gout based on your symptoms and exam. In some cases, tests may be recommended. The tests may include:  Blood tests.  Urine tests.  X-rays.  Joint fluid exam. This exam requires a needle to remove fluid from the joint (arthrocentesis). Using a microscope, gout is confirmed when uric acid crystals are seen in the joint fluid. TREATMENT  There are two phases to gout treatment: treating the sudden onset (acute) attack and preventing attacks (prophylaxis).  Treatment of an Acute Attack.  Medicines are used. These include anti-inflammatory medicines or steroid medicines.  An injection of steroid medicine into the affected joint is sometimes necessary.  The painful joint is rested. Movement can worsen the arthritis.  You may use warm or cold treatments on painful joints, depending which works best for you.  Treatment to Prevent Attacks.  If you suffer from frequent gout attacks, your caregiver may advise preventive medicine. These medicines are started after the acute attack subsides. These medicines either help your kidneys eliminate uric acid from your body or decrease your uric acid production. You may need to stay on these  medicines for a very long time.  The early phase of treatment with preventive medicine can be associated with an increase  in acute gout attacks. For this reason, during the first few months of treatment, your caregiver may also advise you to take medicines usually used for acute gout treatment. Be sure you understand your caregiver's directions. Your caregiver may make several adjustments to your medicine dose before these medicines are effective.  Discuss dietary treatment with your caregiver or dietitian. Alcohol and drinks high in sugar and fructose and foods such as meat, poultry, and seafood can increase uric acid levels. Your caregiver or dietician can advise you on drinks and foods that should be limited. HOME CARE INSTRUCTIONS   Do not take aspirin to relieve pain. This raises uric acid levels.  Only take over-the-counter or prescription medicines for pain, discomfort, or fever as directed by your caregiver.  Rest the joint as much as possible. When in bed, keep sheets and blankets off painful areas.  Keep the affected joint raised (elevated).  Apply warm or cold treatments to painful joints. Use of warm or cold treatments depends on which works best for you.  Use crutches if the painful joint is in your leg.  Drink enough fluids to keep your urine clear or pale yellow. This helps your body get rid of uric acid. Limit alcohol, sugary drinks, and fructose drinks.  Follow your dietary instructions. Pay careful attention to the amount of protein you eat. Your daily diet should emphasize fruits, vegetables, whole grains, and fat-free or low-fat milk products. Discuss the use of coffee, vitamin C, and cherries with your caregiver or dietician. These may be helpful in lowering uric acid levels.  Maintain a healthy body weight. SEEK MEDICAL CARE IF:   You develop diarrhea, vomiting, or any side effects from medicines.  You do not feel better in 24 hours, or you are getting worse. SEEK  IMMEDIATE MEDICAL CARE IF:   Your joint becomes suddenly more tender, and you have chills or a fever. MAKE SURE YOU:   Understand these instructions.  Will watch your condition.  Will get help right away if you are not doing well or get worse. Document Released: 08/19/2000 Document Revised: 12/17/2012 Document Reviewed: 04/04/2012 [facility] Patient Information 2014 [place].      Standley Brooking. Panosh M.D.  Lab Results  Component Value Date   WBC 11.2* 12/19/2012   HGB 11.8* 12/19/2012   HCT 36.8 12/19/2012   PLT 298.0 12/19/2012   GLUCOSE 96 05/15/2013   CHOL 131 04/09/2013   TRIG 108.0 04/09/2013   HDL 45.70 04/09/2013   LDLDIRECT 139.4 06/30/2010   LDLCALC 64 04/09/2013   ALT 17 05/15/2013   AST 24 05/15/2013   NA 141 05/15/2013   K 4.9 05/15/2013   CL 101 05/15/2013   CREATININE 0.97 05/15/2013   BUN 18 05/15/2013   CO2 29 05/15/2013   TSH 0.63 12/19/2012   INR 1.04 03/18/2011   HGBA1C 6.2 12/19/2012   MICROALBUR 0.2 07/09/2009

## 2014-01-01 ENCOUNTER — Other Ambulatory Visit: Payer: Self-pay | Admitting: Pharmacist Clinician (PhC)/ Clinical Pharmacy Specialist

## 2014-01-01 MED ORDER — RIVAROXABAN 20 MG PO TABS: 20.0000 mg | ORAL_TABLET | Freq: Every day | ORAL | Status: DC

## 2014-01-02 ENCOUNTER — Other Ambulatory Visit: Payer: Self-pay

## 2014-01-02 MED ORDER — RIVAROXABAN 20 MG PO TABS: 20.0000 mg | ORAL_TABLET | Freq: Every day | ORAL | Status: DC

## 2014-01-02 NOTE — Telephone Encounter (Signed)
Routed to Renold Genta, PharmD.

## 2014-01-09 ENCOUNTER — Other Ambulatory Visit: Payer: Self-pay | Admitting: Pharmacist Clinician (PhC)/ Clinical Pharmacy Specialist

## 2014-01-12 ENCOUNTER — Other Ambulatory Visit: Payer: Self-pay | Admitting: Internal Medicine

## 2014-01-14 ENCOUNTER — Other Ambulatory Visit (INDEPENDENT_AMBULATORY_CARE_PROVIDER_SITE_OTHER): Payer: Commercial Managed Care - HMO

## 2014-01-14 DIAGNOSIS — Z0000 Encounter for general adult medical examination without abnormal findings: Secondary | ICD-10-CM

## 2014-01-14 LAB — HEPATIC FUNCTION PANEL
ALBUMIN: 3.7 g/dL (ref 3.5–5.2)
ALK PHOS: 70 U/L (ref 39–117)
ALT: 17 U/L (ref 0–35)
AST: 28 U/L (ref 0–37)
Bilirubin, Direct: 0.2 mg/dL (ref 0.0–0.3)
TOTAL PROTEIN: 6.6 g/dL (ref 6.0–8.3)
Total Bilirubin: 0.8 mg/dL (ref 0.2–1.2)

## 2014-01-14 LAB — CBC WITH DIFFERENTIAL/PLATELET
Basophils Absolute: 0 10*3/uL (ref 0.0–0.1)
Basophils Relative: 0.4 % (ref 0.0–3.0)
Eosinophils Absolute: 0.3 10*3/uL (ref 0.0–0.7)
Eosinophils Relative: 3.9 % (ref 0.0–5.0)
HCT: 39 % (ref 36.0–46.0)
HEMOGLOBIN: 12.8 g/dL (ref 12.0–15.0)
LYMPHS ABS: 1.2 10*3/uL (ref 0.7–4.0)
Lymphocytes Relative: 18.4 % (ref 12.0–46.0)
MCHC: 32.9 g/dL (ref 30.0–36.0)
MCV: 95.5 fl (ref 78.0–100.0)
MONOS PCT: 7.4 % (ref 3.0–12.0)
Monocytes Absolute: 0.5 10*3/uL (ref 0.1–1.0)
NEUTROS ABS: 4.6 10*3/uL (ref 1.4–7.7)
Neutrophils Relative %: 69.9 % (ref 43.0–77.0)
Platelets: 177 10*3/uL (ref 150.0–400.0)
RBC: 4.08 Mil/uL (ref 3.87–5.11)
RDW: 16.7 % — ABNORMAL HIGH (ref 11.5–15.5)
WBC: 6.5 10*3/uL (ref 4.0–10.5)

## 2014-01-14 LAB — URIC ACID: URIC ACID, SERUM: 5.7 mg/dL (ref 2.4–7.0)

## 2014-01-14 LAB — LIPID PANEL
CHOL/HDL RATIO: 3
Cholesterol: 165 mg/dL (ref 0–200)
HDL: 58.3 mg/dL (ref >39.00)
LDL Cholesterol: 79 mg/dL (ref 0–99)
TRIGLYCERIDES: 139 mg/dL (ref 0.0–149.0)
VLDL: 27.8 mg/dL (ref 0.0–40.0)

## 2014-01-14 LAB — BASIC METABOLIC PANEL
BUN: 18 mg/dL (ref 6–23)
CO2: 28 meq/L (ref 19–32)
CREATININE: 1 mg/dL (ref 0.4–1.2)
Calcium: 9.4 mg/dL (ref 8.4–10.5)
Chloride: 104 mEq/L (ref 96–112)
GFR: 57.27 mL/min — ABNORMAL LOW (ref >60.00)
Glucose, Bld: 95 mg/dL (ref 70–99)
Potassium: 4 mEq/L (ref 3.5–5.1)
Sodium: 140 mEq/L (ref 135–145)

## 2014-01-14 LAB — TSH: TSH: 0.82 u[IU]/mL (ref 0.35–4.50)

## 2014-01-14 LAB — HEMOGLOBIN A1C: Hgb A1c MFr Bld: 6.2 % (ref 4.6–6.5)

## 2014-01-17 ENCOUNTER — Other Ambulatory Visit: Payer: Self-pay | Admitting: Sports Medicine

## 2014-01-20 ENCOUNTER — Ambulatory Visit (INDEPENDENT_AMBULATORY_CARE_PROVIDER_SITE_OTHER): Payer: Commercial Managed Care - HMO | Admitting: Internal Medicine

## 2014-01-20 ENCOUNTER — Encounter: Payer: Self-pay | Admitting: Internal Medicine

## 2014-01-20 VITALS — BP 96/60 | Temp 98.0°F | Ht 61.0 in | Wt 314.3 lb

## 2014-01-20 DIAGNOSIS — E538 Deficiency of other specified B group vitamins: Secondary | ICD-10-CM

## 2014-01-20 DIAGNOSIS — Z0000 Encounter for general adult medical examination without abnormal findings: Secondary | ICD-10-CM | POA: Insufficient documentation

## 2014-01-20 DIAGNOSIS — M109 Gout, unspecified: Secondary | ICD-10-CM

## 2014-01-20 DIAGNOSIS — M549 Dorsalgia, unspecified: Secondary | ICD-10-CM | POA: Insufficient documentation

## 2014-01-20 DIAGNOSIS — Z8673 Personal history of transient ischemic attack (TIA), and cerebral infarction without residual deficits: Secondary | ICD-10-CM

## 2014-01-20 DIAGNOSIS — I10 Essential (primary) hypertension: Secondary | ICD-10-CM

## 2014-01-20 DIAGNOSIS — D518 Other vitamin B12 deficiency anemias: Secondary | ICD-10-CM

## 2014-01-20 DIAGNOSIS — Z7901 Long term (current) use of anticoagulants: Secondary | ICD-10-CM

## 2014-01-20 MED ORDER — CYANOCOBALAMIN 1000 MCG/ML IJ SOLN
1000.0000 ug | Freq: Once | INTRAMUSCULAR | Status: AC
  Administered 2014-01-20: 1000 ug via INTRAMUSCULAR

## 2014-01-20 NOTE — Assessment & Plan Note (Signed)
UA now below 6 continue 200 allopurinol per day

## 2014-01-20 NOTE — Patient Instructions (Addendum)
Labs are good today. Continue lifestyle intervention healthy eating and exercise . Weight loss will help also.   Balance  Exercise.   Bp is low normal rto low but i fyou are feeling well  No dizziness or ligh theded ness then continue as readings were higher at ortho and cardiology . Could also be a measuring effect of the a fib and incorrectly hearing the top number Sugars are stable . b12 monthly injections. ROV in 6 months  hgba1c bmp pre visit .

## 2014-01-20 NOTE — Progress Notes (Signed)
Pre visit review using our clinic review tool, if applicable. No additional management support is needed unless otherwise documented below in the visit note.   Chief Complaint  Patient presents with  . Medicare Wellness    fu gout labs etc     HPI: Patient comes in today for Preventive Medicare wellness visit . And medication management  No major injuries, ed visits ,hospitalizations , new medications since last visit.  stil has a fib  No sx . Breathing stable . Saw cards in march   Back  Injections  Helped 50%  Dr Nelva Bush going for second paim ,ed ocassionally  No more "gout" attacks taking 200 allopurinol no  se. Has colcrys at home   Dur for b212 injection  Sleep ok still snores not as bac.  Left hemiparesis  Trying to do exercise and weaker than she thought; trying to do the\is walks with can support   Left foot  Left dominant  hand  D in law just dx with bilateral breast cancer neg brca age 63 to have double mastectomy    Health Maintenance  Topic Date Due  . Mammogram  03/05/2014  . Influenza Vaccine  04/05/2014  . Colonoscopy  09/05/2014  . Pap Smear  04/06/2015  . Tetanus/tdap  07/17/2023  . Zostavax  Completed   Health Maintenance Review     Hearing: ok  Vision:  No limitations at present . Last eye check UTDcataract right eye   Safety:  Has smoke detector and wears seat belts.  No firearms. No excess sun exposure. Sees dentist regularly.  Falls: no  Advance directive :  Reviewed    Memory: Felt to be good  , no concern from her or her family.  Depression: No anhedonia unusual crying or depressive symptoms  Nutrition: Eats well balanced diet; adequate calcium and vitamin D. No swallowing chewing problems.  Injury: no major injuries in the last six months.  Other healthcare providers:  Reviewed today .  Social:  LivesAlone  nopets.   Preventive parameters: up-to-date  Reviewed   ADLS:   There are no problems or need for assistance  driving,  feeding, obtaining food, dressing, toileting and bathing, managing money using phone. She is independent.some mobility  With cane.   EXERCISE/ HABITS  Per week  Fitness class via church 2 x per week.   No tobacco    etoh   ROS:  GEN/ HEENT: No fever, significant weight changes sweats headaches vision problems hearing changes, CV/ PULM; No chest pain new shortness of breath  Some doe but no changes cough, syncope,edema  ocass leg after ibuprofen change in exercise tolerance. GI /GU: No adominal pain, vomiting, change in bowel habits. No blood in the stool. No significant GU symptoms. SKIN/HEME: ,no acute skin rashes suspicious lesions or bleeding. No lymphadenopathy, nodules, masses.  NEURO/ PSYCH:  No new neurologicsx such as  Numbness   No depression anxiety. Just stress  IMM/ Allergy: No unusual infections.  Allergy .   REST of 12 system review negative except as per HPI   Past Medical History  Diagnosis Date  . Hypertension   . Hypothyroid   . Obesity   . Post-menopausal bleeding   . Arthritis   . B12 deficiency   . CVA (cerebral infarction) 2 19 2012     r frontal  thrombotic    . LVH (left ventricular hypertrophy)      by echo 2012  . Atrial fibrillation   . Stroke  10/2010  . Left leg weakness     r/t stroke 10/2010  . Sleep apnea     no cpap - surgery to removed tonsils/cut down uvula  . Blood transfusion     at pre-op appt 10/2, per pt, no hx of bld transfusion  . Hyperlipidemia     recent labs normal  . Diverticulosis   . Colon polyps   . Osteopetrosis   . Neuromuscular disorder   . Fatigue   . Stress fracture 10/13    right foot, healed within 3 weeks  . Tendonitis 1/14    left foot    Family History  Problem Relation Age of Onset  . COPD Mother   . Hypertension Mother   . Osteoporosis Mother   . Diabetes Father   . Hypertension Father   . Liver cancer Father   . Hypertension Sister   . Hypertension Brother     History   Social History  .  Marital Status: Divorced    Spouse Name: N/A    Number of Children: N/A  . Years of Education: N/A   Social History Main Topics  . Smoking status: Never Smoker   . Smokeless tobacco: Never Used  . Alcohol Use: 0.0 oz/week     Comment: occ  . Drug Use: No  . Sexual Activity: No   Other Topics Concern  . 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 allopurinol (ZYLOPRIM) 100 MG tablet Take 1 tablet (100 mg total) by mouth daily. Increase by 100 mg per week until 300 mg per day  . atenolol (TENORMIN) 50 MG tablet TAKE 1 TABLET BY MOUTH IN THE MORNING AND 1/2 TABLET AT NIGHT AS DIRECTED  . diclofenac sodium (VOLTAREN) 1 % GEL Apply topically.    . fish oil-omega-3 fatty acids 1000 MG capsule Take 2 g by mouth daily.    Marland Kitchen gabapentin (NEURONTIN) 300 MG capsule TAKE 1 CAPSULE (300 MG TOTAL) BY MOUTH DAILY.  Marland Kitchen levothyroxine (SYNTHROID, LEVOTHROID) 75 MCG tablet TAKE 1 TABLET BY MOUTH EVERY DAY  . lisinopril-hydrochlorothiazide (PRINZIDE,ZESTORETIC) 20-25 MG per tablet TAKE 1 TABLET BY MOUTH ONCE DAILY  . oxyCODONE (OXY IR/ROXICODONE) 5 MG immediate release tablet   . rivaroxaban (XARELTO) 20 MG TABS tablet Take 1 tablet (20 mg total) by mouth daily with supper.  . simvastatin (ZOCOR) 20 MG tablet TAKE 1 TABLET BY MOUTH EVERY EVENING  . traMADol (ULTRAM) 50 MG tablet   . Vitamin D, Ergocalciferol, (DRISDOL) 50000 UNITS CAPS capsule   . colchicine (COLCRYS) 0.6 MG tablet Take 1 tablet (0.6 mg total) by mouth 2 (two) times daily. For gout  . [DISCONTINUED] doxycycline (ADOXA) 100 MG tablet     EXAM:  BP 96/60  Temp(Src) 98 F (36.7 C) (Oral)  Ht _0  (1.549 m)  Wt 314 lb 5 oz (142.571 kg)  BMI 59.42 kg/m2  LMP 05/06/2013  Body mass index is 59.42 kg/(m^2). Repeat bp 96/68 right large and thigh irreg  rhythym Physical Exam: Vital signs reviewed cannot get onto exam table  MWU:XLKG is a well-developed well-nourished alert cooperative   who appears stated age in no acute distress. Mildly hoarse no change HEENT: normocephalic atraumatic , Eyes: PERRL EOM's full, conjunctiva clear, Nares: paten,t no  deformity discharge or tenderness., Ears: no deformity EAC's clear TMs with normal landmarks. Mouth: clear OP, no lesions, edema.  Moist mucous membranes. Dentition in adequate repair. NECK: supple without masses, thyromegaly or bruits. CHEST/PULM:  Clear to auscultation and percussion breath sounds equal no wheeze , rales or rhonchi. No chest wall deformities or tenderness. breast exam sitting  No nodules or discharge  CV: PMI is nondisplaced, IRIR S1 S2 no gallops, murmurs, rubs. Peripheral pulses are full without delay.No JVD .  ABDOMEN: Bowel sounds normal nontender  No guard or rebound, no hepato splenomegal no CVA tenderness.  Sitting exan limited  Extremtities:  No clubbing cyanosis or edema, no acute joint swelling right ring finger with nodule on ppip or redness mild weakness right upper and lower exprtremity no foot drop  No ulcers on feet  NEURO:  Oriented x3, cranial nerves 3-12 appear to be intact, mild hemiparesis  l left gait with cane antalgic ( poss from back)  SKIN: No acute rashes normal turgor, color, no bruising or petechiae. PSYCH: Oriented, good eye contact, no obvious depression anxiety, cognition and judgment appear normal. LN: no cervical axillary  adenopathy No noted deficits in memory, attention, and speech.   Lab Results  Component Value Date   WBC 6.5 01/14/2014   HGB 12.8 01/14/2014   HCT 39.0 01/14/2014   PLT 177.0 01/14/2014   GLUCOSE 95 01/14/2014   CHOL 165 01/14/2014   TRIG 139.0 01/14/2014   HDL 58.30 01/14/2014   LDLDIRECT 139.4 06/30/2010   LDLCALC 79 01/14/2014   ALT 17 01/14/2014   AST 28 01/14/2014   NA 140 01/14/2014   K 4.0 01/14/2014   CL 104 01/14/2014    CREATININE 1.0 01/14/2014   BUN 18 01/14/2014   CO2 28 01/14/2014   TSH 0.82 01/14/2014   INR 1.04 03/18/2011   HGBA1C 6.2 01/14/2014   MICROALBUR 0.2 07/09/2009   Labs reviewed  ASSESSMENT AND PLAN:  Discussed the following assessment and plan:  Visit for preventive health examination  Vitamin B12 deficiency - Plan: cyanocobalamin ((VITAMIN B-12)) injection 1,000 mcg  Chronic anticoagulation  Unspecified essential hypertension  H/O: CVA (cerebrovascular accident)  Gout - no recnet flares UA belwo 6 saty on 200 allop  Back pain  Medicare annual wellness visit, subsequent  History of stroke  ANEMIA, VITAMIN B12 DEFICIENCY NEC  Patient Care Team: [name], MD as PCP - General [name], MD (Cardiology) Garvin Fila, MD (Neurology) Thornell Sartorius, MD Newt Minion, MD as Attending Physician (Orthopedic Surgery)  Patient Instructions  Labs are good today. Continue lifestyle intervention healthy eating and exercise . Weight loss will help also.   Balance  Exercise.   Bp is low normal rto low but i fyou are feeling well  No dizziness or ligh theded ness then continue as readings were higher at ortho and cardiology . Could also be a measuring effect of the a fib and incorrectly hearing the top number Sugars are stable . b12 monthly injections. ROV in 6 months  hgba1c bmp pre visit .     Standley Brooking. Panosh M.D.

## 2014-01-20 NOTE — Assessment & Plan Note (Signed)
bp low today but no sx  Of low continue as per cards

## 2014-01-20 NOTE — Assessment & Plan Note (Signed)
residual left hemiparesis  Ambulatory  And can drive  Agree with modified exercise still disabled

## 2014-01-20 NOTE — Assessment & Plan Note (Signed)
Monthly injections advised

## 2014-01-21 ENCOUNTER — Telehealth: Payer: Self-pay | Admitting: Internal Medicine

## 2014-01-21 NOTE — Telephone Encounter (Signed)
Relevant patient education assigned to patient using Emmi. ° °

## 2014-01-30 ENCOUNTER — Ambulatory Visit (INDEPENDENT_AMBULATORY_CARE_PROVIDER_SITE_OTHER): Payer: Commercial Managed Care - HMO | Admitting: Nurse Practitioner

## 2014-01-30 ENCOUNTER — Encounter: Payer: Self-pay | Admitting: Nurse Practitioner

## 2014-01-30 VITALS — BP 132/76 | HR 65 | Ht 61.0 in | Wt 319.0 lb

## 2014-01-30 DIAGNOSIS — I10 Essential (primary) hypertension: Secondary | ICD-10-CM

## 2014-01-30 DIAGNOSIS — G478 Other sleep disorders: Secondary | ICD-10-CM

## 2014-01-30 DIAGNOSIS — I639 Cerebral infarction, unspecified: Secondary | ICD-10-CM

## 2014-01-30 DIAGNOSIS — G4733 Obstructive sleep apnea (adult) (pediatric): Secondary | ICD-10-CM

## 2014-01-30 DIAGNOSIS — I6350 Cerebral infarction due to unspecified occlusion or stenosis of unspecified cerebral artery: Secondary | ICD-10-CM

## 2014-01-30 NOTE — Progress Notes (Signed)
[facility]  PATIENT: [name] DOB: 1950/09/21   REASON FOR VISIT: Followup for history of stroke and mild peripheral neuropathy  HISTORY OF PRESENT ILLNESS: [name] is a 63 year-old female returns for followup. She was last seen in this office 11/28/2012. 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 She is currently on Xarelto without significant side effects. She also has a long history of chronic back pain from degenerative lumbar disease. No recent falls, she uses a cane to ambulate. She also has a history of mild peripheral neuropathy per  EMG nerve conduction. Neuropathy panel labs return normal. Last carotid Doppler in 2012. Other risk factors are obesity, hypertension, and hyperlipidemia. She had sleep study done by Dr. Annamaria Boots April 2012 which shows obstructive sleep apnea however patient does not use  CPAP. She denies further stroke or TIA symptoms. She returns for reevaluation   REVIEW OF SYSTEMS: Full 14 system review of systems performed and notable only for those listed, all others are neg:  Constitutional: N/A  Cardiovascular: Ankle swelling  Ear/Nose/Throat: N/A  Skin: N/A  Eyes: N/A  Respiratory: N/A  Gastroitestinal: N/A  Hematology/Lymphatic: N/A  Endocrine: N/A Musculoskeletal:N/A  Allergy/Immunology: N/A  Neurological: N/A Psychiatric: N/A Sleep : NA   ALLERGIES: Allergies  Allergen Reactions  . Ambien [Zolpidem Tartrate]     Amnesia and fall   . Prevacid [Lansoprazole] Swelling  . Ace Inhibitors Cough  . Benadryl [Diphenhydramine Hcl]     topical  . Losartan     Elevated creatinine and swelling  . Motrin [Ibuprofen] Other (See Comments)    ANKLE EDEMA   . Prilosec [Omeprazole]     Swelling in ankles.    HOME MEDICATIONS: Outpatient Prescriptions Prior to Visit  Medication Sig Dispense Refill  . allopurinol (ZYLOPRIM) 100 MG tablet Take 1 tablet (100 mg  total) by mouth daily. Increase by 100 mg per week until 300 mg per day  90 tablet  1  . atenolol (TENORMIN) 50 MG tablet TAKE 1 TABLET BY MOUTH IN THE MORNING AND 1/2 TABLET AT NIGHT AS DIRECTED  135 tablet  1  . diclofenac sodium (VOLTAREN) 1 % GEL Apply topically as needed.       . fish oil-omega-3 fatty acids 1000 MG capsule Take 2 g by mouth daily.        Marland Kitchen gabapentin (NEURONTIN) 300 MG capsule TAKE 1 CAPSULE (300 MG TOTAL) BY MOUTH DAILY.  90 capsule  0  . levothyroxine (SYNTHROID, LEVOTHROID) 75 MCG tablet TAKE 1 TABLET BY MOUTH EVERY DAY  90 tablet  1  . lisinopril-hydrochlorothiazide (PRINZIDE,ZESTORETIC) 20-25 MG per tablet TAKE 1 TABLET BY MOUTH ONCE DAILY  90 tablet  1  . oxyCODONE (OXY IR/ROXICODONE) 5 MG immediate release tablet as needed.       . rivaroxaban (XARELTO) 20 MG TABS tablet Take 1 tablet (20 mg total) by mouth daily with supper.  30 tablet  6  . simvastatin (ZOCOR) 20 MG tablet TAKE 1 TABLET BY MOUTH EVERY EVENING  90 tablet  0  . traMADol (ULTRAM) 50 MG tablet as needed.       . Vitamin D, Ergocalciferol, (DRISDOL) 50000 UNITS CAPS capsule       . colchicine (COLCRYS) 0.6 MG tablet Take 1 tablet (0.6 mg total) by mouth 2 (two) times daily. For gout  60 tablet  3   Facility-Administered Medications Prior to Visit  Medication Dose Route  Frequency Provider Last Rate Last Dose  . cyanocobalamin ((VITAMIN B-12)) injection 1,000 mcg  1,000 mcg Intramuscular Once [name], MD        PAST MEDICAL HISTORY: Past Medical History  Diagnosis Date  . Hypertension   . Hypothyroid   . Obesity   . Post-menopausal bleeding   . Arthritis   . B12 deficiency   . CVA (cerebral infarction) 2 19 2012     r frontal  thrombotic    . LVH (left ventricular hypertrophy)      by echo 2012  . Atrial fibrillation   . Stroke     10/2010  . Left leg weakness     r/t stroke 10/2010  . Sleep apnea     no cpap - surgery to removed tonsils/cut down uvula  . Blood transfusion     at  pre-op appt 10/2, per pt, no hx of bld transfusion  . Hyperlipidemia     recent labs normal  . Diverticulosis   . Colon polyps   . Osteopetrosis   . Neuromuscular disorder   . Fatigue   . Stress fracture 10/13    right foot, healed within 3 weeks  . Tendonitis 1/14    left foot    PAST SURGICAL HISTORY: Past Surgical History  Procedure Laterality Date  . Total knee arthroplasty  8676,1950, 2011    Lt 2001, rt 2007 and revision left 2011  . Rt shoulder surgery  06/2010    x3  . Eye muscle surgery  1952    left eye  . Tonsillectomy    . Nose surgery    . Hysteroscopy w/d&c  06/13/2011    Procedure: DILATATION AND CURETTAGE (D&C) /HYSTEROSCOPY;  Surgeon: Felipa Emory;  Location: [facility];  Service: Gynecology;  Laterality: N/A;    FAMILY HISTORY: Family History  Problem Relation Age of Onset  . COPD Mother   . Hypertension Mother   . Osteoporosis Mother   . Diabetes Father   . Hypertension Father   . Liver cancer Father   . Hypertension Sister   . Hypertension Brother     SOCIAL HISTORY: History   Social History  . Marital Status: Divorced    Spouse Name: N/A    Number of Children: N/A  . Years of Education: N/A   Occupational History  . Not on file.   Social History Main Topics  . Smoking status: Never Smoker   . Smokeless tobacco: Never Used  . Alcohol Use: 0.0 oz/week     Comment: occ  . Drug Use: No  . Sexual Activity: No   Other Topics Concern  . 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  Generalized: Well developed, morbidly obese female in no acute distress  Head: normocephalic and atraumatic,. Oropharynx benign  Neck: Supple, no carotid bruits  Cardiac: Regular  rate rhythm, no murmur  Musculoskeletal: No deformity   Neurological examination   Mentation: Alert oriented to time, place, history taking. Follows all commands speech and language fluent  Cranial nerve II-XII: Fundi not visualized .Pupils were equal  round reactive to light extraocular movements were full, visual field were full on confrontational test. Facial sensation and strength were normal. hearing was intact to finger rubbing bilaterally. Uvula tongue midline. head turning and shoulder shrug were normal and symmetric.Tongue protrusion into cheek strength was normal. Motor: normal bulk and tone, full strength in the BUE, BLE,  No focal weakness Sensory: normal and symmetric to light touch, pinprick, and  vibration  Coordination: finger-nose-finger, heel-to-shin bilaterally, no dysmetria Reflexes: 1+ in the upper extremities, decreased in the lower extremities plantar responses were flexor bilaterally. Gait and Station: Rising up from seated position without assistance, wide based stance,  unable to stand on either foot cannot tandem ambulates with a single-point cane   DIAGNOSTIC DATA (LABS, IMAGING, TESTING) - I reviewed patient records, labs, notes, testing and imaging myself where available.  Lab Results  Component Value Date   WBC 6.5 01/14/2014   HGB 12.8 01/14/2014   HCT 39.0 01/14/2014   MCV 95.5 01/14/2014   PLT 177.0 01/14/2014      Component Value Date/Time   NA 140 01/14/2014 0921   K 4.0 01/14/2014 0921   CL 104 01/14/2014 0921   CO2 28 01/14/2014 0921   GLUCOSE 95 01/14/2014 0921   BUN 18 01/14/2014 0921   CREATININE 1.0 01/14/2014 0921   CREATININE 0.97 05/15/2013 1459   CALCIUM 9.4 01/14/2014 0921   PROT 6.6 01/14/2014 0921   ALBUMIN 3.7 01/14/2014 0921   AST 28 01/14/2014 0921   ALT 17 01/14/2014 0921   ALKPHOS 70 01/14/2014 0921   BILITOT 0.8 01/14/2014 0921   GFRNONAA 66* 06/07/2011 1115   GFRAA 76* 06/07/2011 1115   Lab Results  Component Value Date   CHOL 165  01/14/2014   HDL 58.30 01/14/2014   LDLCALC 79 01/14/2014   LDLDIRECT 139.4 06/30/2010   TRIG 139.0 01/14/2014   CHOLHDL 3 01/14/2014   Lab Results  Component Value Date   HGBA1C 6.2 01/14/2014   Lab Results  Component Value Date   VITAMINB12 466 12/19/2012   Lab Results  Component Value Date   TSH 0.82 01/14/2014      ASSESSMENT AND PLAN  63 y.o. year old female  has a past medical history of right medial frontal MCA branch infarct in February 2012 from atrial fibrillation. Vascular risk factors of obesity, hypertension hyperlipidemia and obstructive sleep apnea.  Continue Xarelto for secondary stroke prevention Aggressive control of blood pressure with systolic less than 962 LDL below 100 Check carotid Doppler last done in 2012 Followup  Yearly and when necessary next appt with Dr. Sloan Leiter, [facility], [facility], [facility] [address], [place], [place] [address] [phone]

## 2014-01-30 NOTE — Patient Instructions (Signed)
Continue Xarelto for secondary stroke prevention Aggressive control of blood pressure with systolic less than 161 LDL below 100 Check carotid Doppler Followup  Yearly and when necessary

## 2014-02-07 ENCOUNTER — Telehealth: Payer: Self-pay | Admitting: Internal Medicine

## 2014-02-07 ENCOUNTER — Ambulatory Visit (INDEPENDENT_AMBULATORY_CARE_PROVIDER_SITE_OTHER): Payer: Commercial Managed Care - HMO

## 2014-02-07 DIAGNOSIS — I639 Cerebral infarction, unspecified: Secondary | ICD-10-CM

## 2014-02-07 DIAGNOSIS — I6350 Cerebral infarction due to unspecified occlusion or stenosis of unspecified cerebral artery: Secondary | ICD-10-CM

## 2014-02-07 NOTE — Telephone Encounter (Signed)
Spoke to the pt.  Per [facility], patient should be seen in the [facility].  Contacted the patient and advised she be seen.  Pt agreed and transferred to the front to be scheduled with [facility] [place].

## 2014-02-07 NOTE — Telephone Encounter (Signed)
Pt needs a [facility] referral sent to dr Leonie Man for carotid doppler  today 02-07-14

## 2014-02-07 NOTE — Telephone Encounter (Signed)
Spoke to [employer].  She reports having SOB and dizziness that started yesterday.  Has increased swelling that started last week in her ankles.  She has not been taking the lasix because she is afraid of a gout flare.  She would like to know if she should have a chest x-ray.  Has had fluid on her lungs in the past and is afraid of a repeat.  No other sx to report or problems. Please advise.  Thanks!

## 2014-02-07 NOTE — Telephone Encounter (Signed)
Misty pt would like for you to call her she said she wanted to talk to you [phone]

## 2014-02-08 ENCOUNTER — Ambulatory Visit (HOSPITAL_COMMUNITY)
Admission: RE | Admit: 2014-02-08 | Discharge: 2014-02-08 | Disposition: A | Discharge status: Home / Self Care | Payer: Medicare HMO | Source: Ambulatory Visit | Attending: Family Medicine | Admitting: Family Medicine

## 2014-02-08 ENCOUNTER — Encounter: Payer: Self-pay | Admitting: Family Medicine

## 2014-02-08 ENCOUNTER — Ambulatory Visit (INDEPENDENT_AMBULATORY_CARE_PROVIDER_SITE_OTHER): Payer: Commercial Managed Care - HMO | Admitting: Family Medicine

## 2014-02-08 VITALS — BP 130/84 | HR 81 | Temp 98.2°F | Wt 324.8 lb

## 2014-02-08 DIAGNOSIS — R0600 Dyspnea, unspecified: Secondary | ICD-10-CM

## 2014-02-08 DIAGNOSIS — R609 Edema, unspecified: Secondary | ICD-10-CM | POA: Insufficient documentation

## 2014-02-08 DIAGNOSIS — R0989 Other specified symptoms and signs involving the circulatory and respiratory systems: Secondary | ICD-10-CM

## 2014-02-08 DIAGNOSIS — R0609 Other forms of dyspnea: Secondary | ICD-10-CM | POA: Insufficient documentation

## 2014-02-08 DIAGNOSIS — I517 Cardiomegaly: Secondary | ICD-10-CM

## 2014-02-08 DIAGNOSIS — M19019 Primary osteoarthritis, unspecified shoulder: Secondary | ICD-10-CM | POA: Insufficient documentation

## 2014-02-08 DIAGNOSIS — Z6841 Body Mass Index (BMI) 40.0 and over, adult: Secondary | ICD-10-CM

## 2014-02-08 NOTE — Progress Notes (Signed)
Pre visit review using our clinic review tool, if applicable. No additional management support is needed unless otherwise documented below in the visit note.
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
## 2014-02-09 ENCOUNTER — Encounter: Payer: Self-pay | Admitting: Internal Medicine

## 2014-02-11 NOTE — Telephone Encounter (Signed)
Still wating on some labs tests. Plan ROV in 3 -4 weeks or so .

## 2014-02-12 ENCOUNTER — Other Ambulatory Visit: Payer: Self-pay | Admitting: Internal Medicine

## 2014-02-12 NOTE — Telephone Encounter (Signed)
Sent to the pharmacy by e-scribe. 

## 2014-02-14 ENCOUNTER — Telehealth: Payer: Self-pay | Admitting: Nurse Practitioner

## 2014-02-14 NOTE — Telephone Encounter (Signed)
The study is negative for significant stenosis. Please call the patient

## 2014-02-18 NOTE — Telephone Encounter (Signed)
Left patient a message that her study was negative. If she has any questions she is to call the office back.

## 2014-02-20 ENCOUNTER — Ambulatory Visit: Payer: Commercial Managed Care - HMO | Admitting: Family Medicine

## 2014-03-05 ENCOUNTER — Encounter: Payer: Self-pay | Admitting: Internal Medicine

## 2014-03-05 ENCOUNTER — Ambulatory Visit (INDEPENDENT_AMBULATORY_CARE_PROVIDER_SITE_OTHER): Payer: Commercial Managed Care - HMO | Admitting: Internal Medicine

## 2014-03-05 DIAGNOSIS — R0600 Dyspnea, unspecified: Secondary | ICD-10-CM

## 2014-03-05 DIAGNOSIS — D518 Other vitamin B12 deficiency anemias: Secondary | ICD-10-CM

## 2014-03-05 DIAGNOSIS — Z7901 Long term (current) use of anticoagulants: Secondary | ICD-10-CM

## 2014-03-05 DIAGNOSIS — D519 Vitamin B12 deficiency anemia, unspecified: Secondary | ICD-10-CM

## 2014-03-05 DIAGNOSIS — R0689 Other abnormalities of breathing: Secondary | ICD-10-CM

## 2014-03-05 DIAGNOSIS — R0609 Other forms of dyspnea: Secondary | ICD-10-CM

## 2014-03-05 DIAGNOSIS — R0989 Other specified symptoms and signs involving the circulatory and respiratory systems: Secondary | ICD-10-CM

## 2014-03-05 MED ORDER — CYANOCOBALAMIN 1000 MCG/ML IJ SOLN
1000.0000 ug | Freq: Once | INTRAMUSCULAR | Status: AC
  Administered 2014-03-05: 1000 ug via INTRAMUSCULAR

## 2014-03-05 NOTE — Progress Notes (Signed)
Pre visit review using our clinic review tool, if applicable. No additional management support is needed unless otherwise documented below in the visit note.  Chief Complaint  Patient presents with  . Follow-up    HPI: [name] was seen about 3-4 weeks ago in Saturday clinic for weight gain edema and sob with x ray showing cmonly  Labs unrevealing BNP offered but no result in record. Since that time she took lasix again which we had limited becauese of the gout hx . SheTook for a while and then sporadic  hasnt taken it for a few days . Her breathing is back to baseline  Without cp  Was able to do water exercise without problem today    ROS: See pertinent positives and negatives per HPI.no fever irritative cough ? If from the acei no change    Past Medical History  Diagnosis Date  . Hypertension   . Hypothyroid   . Obesity   . Post-menopausal bleeding   . Arthritis   . B12 deficiency   . CVA (cerebral infarction) 2 19 2012     r frontal  thrombotic    . LVH (left ventricular hypertrophy)      by echo 2012  . Atrial fibrillation   . Stroke     10/2010  . Left leg weakness     r/t stroke 10/2010  . Sleep apnea     no cpap - surgery to removed tonsils/cut down uvula  . Blood transfusion     at pre-op appt 10/2, per pt, no hx of bld transfusion  . Hyperlipidemia     recent labs normal  . Diverticulosis   . Colon polyps   . Osteopetrosis   . Neuromuscular disorder   . Fatigue   . Stress fracture 10/13    right foot, healed within 3 weeks  . Tendonitis 1/14    left foot    Family History  Problem Relation Age of Onset  . COPD Mother   . Hypertension Mother   . Osteoporosis Mother   . Diabetes Father   . Hypertension Father   . Liver cancer Father   . Hypertension Sister   . Hypertension Brother     History   Social History  . Marital Status: Divorced    Spouse Name: N/A    Number of Children: N/A  . Years of Education: N/A   Social History Main Topics  .  Smoking status: Never Smoker   . Smokeless tobacco: Never Used  . Alcohol Use: 0.0 oz/week     Comment: occ  . Drug Use: No  . Sexual Activity: No   Other Topics Concern  . 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 allopurinol (ZYLOPRIM) 100 MG tablet Take 1 tablet (100 mg total) by mouth daily. Increase by 100 mg per week until 300 mg per day  . atenolol (TENORMIN) 50 MG tablet TAKE 1 TABLET BY MOUTH IN THE MORNING AND 1/2 TABLET AT NIGHT AS DIRECTED  . colchicine 0.6 MG tablet Take 0.6 mg by mouth as needed. For gout  . diclofenac sodium (VOLTAREN) 1 % GEL Apply topically as needed.   . fish oil-omega-3 fatty acids 1000 MG capsule Take 2 g by mouth daily.    Marland Kitchen  gabapentin (NEURONTIN) 300 MG capsule TAKE 1 CAPSULE (300 MG TOTAL) BY MOUTH DAILY.  Marland Kitchen levothyroxine (SYNTHROID, LEVOTHROID) 75 MCG tablet TAKE 1 TABLET BY MOUTH EVERY DAY  . lisinopril-hydrochlorothiazide (PRINZIDE,ZESTORETIC) 20-25 MG per tablet TAKE 1 TABLET BY MOUTH ONCE DAILY  . oxyCODONE (OXY IR/ROXICODONE) 5 MG immediate release tablet as needed.   . rivaroxaban (XARELTO) 20 MG TABS tablet Take 1 tablet (20 mg total) by mouth daily with supper.  . simvastatin (ZOCOR) 20 MG tablet TAKE 1 TABLET BY MOUTH EVERY EVENING  . traMADol (ULTRAM) 50 MG tablet as needed.   . Vitamin D, Ergocalciferol, (DRISDOL) 50000 UNITS CAPS capsule   . [EXPIRED] cyanocobalamin ((VITAMIN B-12)) injection 1,000 mcg     EXAM:  BP 112/66  Pulse 72  Temp(Src) 98 F (36.7 C) (Oral)  LMP 05/06/2013  There is no weight on file to calculate BMI. BP Readings from Last 3 Encounters:  03/05/14 112/66  02/08/14 130/84  01/30/14 132/76   Wt Readings from Last 3 Encounters:  02/08/14 324 lb 12.8 oz (147.328 kg)  01/30/14 319 lb (144.697 kg)  01/20/14 314 lb 5 oz (142.571  kg)   Didn't weigh today  ? Patient request?  GENERAL: vitals reviewed and listed above, alert, oriented, appears well hydrated and in no acute distress HEENT: atraumatic, conjunctiva  clear, no obvious abnormalities on inspection of external nose and earsNECK: no obvious masses on inspection palpation  LUNGS: clear to auscultation bilaterally, no wheezes, rales or rhonchi,  CV: HRRR, no clubbing cyanosis edema 1+ large legs l cap refill  MS: moves all extremities  Gait with cane  PSYCH: pleasant and cooperative, no obvious depression or anxiety Lab Results  Component Value Date   WBC 6.5 01/14/2014   HGB 12.8 01/14/2014   HCT 39.0 01/14/2014   PLT 177.0 01/14/2014   GLUCOSE 95 01/14/2014   CHOL 165 01/14/2014   TRIG 139.0 01/14/2014   HDL 58.30 01/14/2014   LDLDIRECT 139.4 06/30/2010   LDLCALC 79 01/14/2014   ALT 17 01/14/2014   AST 28 01/14/2014   NA 140 01/14/2014   K 4.0 01/14/2014   CL 104 01/14/2014   CREATININE 1.0 01/14/2014   BUN 18 01/14/2014   CO2 28 01/14/2014   TSH 0.82 01/14/2014   INR 1.04 03/18/2011   HGBA1C 6.2 01/14/2014   MICROALBUR 0.2 07/09/2009   Lab Results  Component Value Date   OZHYQMVH84 696 12/19/2012   See  ;ast visit and chest x ray normal. ASSESSMENT AND PLAN:  Discussed the following assessment and plan:  Morbid obesity - pt didnt weight today  disc need for follow weights  Dyspnea and respiratory abnormality - resolved  back to baseline per pt  plan close observation use lasix if needed and fu if recocurreing check weight  get cards to see if recurring  Anemia, B12 deficiency - Plan: cyanocobalamin ((VITAMIN B-12)) injection 1,000 mcg  Chronic anticoagulation Disc with patient she needs to get a scale and weight at least 3 x per week if not every day and then contact health care team  If rapid wight gain . Could be fluid retention  She looks baseline today able to do water exercise .    Will check into BNP results  -Patient advised to return or notify  health care team  if symptoms worsen ,persist or new concerns arise.  Patient Instructions  Glad you are doing better . Concern if this could have been a heart strain or failure episode. Considerations of  taking the lasix more regluarly or if happens again   At this time seems fine to take as needed but if continued swelling and or shortness of breath then we need cardiology to reassess and also check chemistry levels for potassium level.   Looking into why the BNP test is not in the EHR  .   i f  normal this is reassuring .  If abnormla plan to get cardiology to see you again .      Standley Brooking. Amaryllis Malmquist M.D. Total visit 78mns > 50% spent counseling and coordinating care    ( [facility] thus no results although the ehr says pending)    No help to repeat today . Now better .

## 2014-03-05 NOTE — Patient Instructions (Signed)
Glad you are doing better . Concern if this could have been a heart strain or failure episode. Considerations of taking the lasix more regluarly or if happens again   At this time seems fine to take as needed but if continued swelling and or shortness of breath then we need cardiology to reassess and also check chemistry levels for potassium level.   Looking into why the BNP test is not in the EHR  .   i f  normal this is reassuring .  If abnormla plan to get cardiology to see you again .

## 2014-03-06 ENCOUNTER — Encounter: Payer: Self-pay | Admitting: Internal Medicine

## 2014-03-06 DIAGNOSIS — R0689 Other abnormalities of breathing: Secondary | ICD-10-CM | POA: Insufficient documentation

## 2014-03-06 DIAGNOSIS — R0600 Dyspnea, unspecified: Secondary | ICD-10-CM | POA: Insufficient documentation

## 2014-03-08 ENCOUNTER — Other Ambulatory Visit: Payer: Self-pay | Admitting: Internal Medicine

## 2014-03-14 ENCOUNTER — Telehealth: Payer: Self-pay | Admitting: Cardiovascular Disease

## 2014-03-14 MED ORDER — RIVAROXABAN 20 MG PO TABS: 20.0000 mg | ORAL_TABLET | Freq: Every day | ORAL | Status: DC

## 2014-03-14 NOTE — Telephone Encounter (Signed)
Pt wants to know if we have any samples of Xarelto 20 mg please.

## 2014-03-14 NOTE — Telephone Encounter (Signed)
Spoke with patient and let her know that her samples would be upfront for her. Patient voiced understanding

## 2014-03-18 ENCOUNTER — Encounter: Payer: Self-pay | Admitting: Internal Medicine

## 2014-03-18 ENCOUNTER — Ambulatory Visit (INDEPENDENT_AMBULATORY_CARE_PROVIDER_SITE_OTHER): Payer: Commercial Managed Care - HMO | Admitting: Internal Medicine

## 2014-03-18 ENCOUNTER — Other Ambulatory Visit: Payer: Self-pay | Admitting: Internal Medicine

## 2014-03-18 VITALS — BP 112/60 | HR 93 | Temp 98.7°F | Ht 61.0 in

## 2014-03-18 DIAGNOSIS — R0982 Postnasal drip: Secondary | ICD-10-CM

## 2014-03-18 DIAGNOSIS — R05 Cough: Secondary | ICD-10-CM

## 2014-03-18 DIAGNOSIS — R059 Cough, unspecified: Secondary | ICD-10-CM

## 2014-03-18 DIAGNOSIS — J329 Chronic sinusitis, unspecified: Secondary | ICD-10-CM

## 2014-03-18 DIAGNOSIS — Z7901 Long term (current) use of anticoagulants: Secondary | ICD-10-CM

## 2014-03-18 NOTE — Patient Instructions (Signed)
Lung exam is good  Today. I see that the drainage could bee adding to your cough . Should get better with time. Try antihistamine  Like generic .  Zyrtec    Without the D.    If  Not Getting improved in the next week  Or  persistent infected looking phlegm   Contact us sometimes antibiotic can help but often resolves on ow. Recheck if fever  Shortness of breath etc.

## 2014-03-18 NOTE — Progress Notes (Signed)
Pre visit review using our clinic review tool, if applicable. No additional management support is needed unless otherwise documented below in the visit note.   Chief Complaint  Patient presents with  . Cough    Has had cough since last Friday.  Sore throat is much better.  . Sore Throat    HPI: Patient comes in today for SDA for  problem evaluation. Cough extremely aggravating  And mostly dry   And then cough s up  Onset 4 days ago  Loose so much .    Leg swelling.    Way down. No sob   Denies  A lisinopril cough . Sister encouraged her to be checked  ROS: See pertinent positives and negatives per HPI. No fever midl nasal congestion after cough taking pepecid prn denies cough from that  Past Medical History  Diagnosis Date  . Hypertension   . Hypothyroid   . Obesity   . Post-menopausal bleeding   . Arthritis   . B12 deficiency   . CVA (cerebral infarction) 2 19 2012     r frontal  thrombotic    . LVH (left ventricular hypertrophy)      by echo 2012  . Atrial fibrillation   . Stroke     10/2010  . Left leg weakness     r/t stroke 10/2010  . Sleep apnea     no cpap - surgery to removed tonsils/cut down uvula  . Blood transfusion     at pre-op appt 10/2, per pt, no hx of bld transfusion  . Hyperlipidemia     recent labs normal  . Diverticulosis   . Colon polyps   . Osteopetrosis   . Neuromuscular disorder   . Fatigue   . Stress fracture 10/13    right foot, healed within 3 weeks  . Tendonitis 1/14    left foot    Family History  Problem Relation Age of Onset  . COPD Mother   . Hypertension Mother   . Osteoporosis Mother   . Diabetes Father   . Hypertension Father   . Liver cancer Father   . Hypertension Sister   . Hypertension Brother     History   Social History  . Marital Status: Divorced    Spouse Name: N/A    Number of Children: N/A  . Years of Education: N/A   Social History Main Topics  . Smoking status: Never Smoker   . Smokeless tobacco:  Never Used  . Alcohol Use: 0.0 oz/week     Comment: occ  . Drug Use: No  . Sexual Activity: No   Other Topics Concern  . 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 allopurinol (ZYLOPRIM) 100 MG tablet TAKE 2 TABLETs (200 MG TOTAL) BY MOUTH DAILY. INCREASE BY 100 MG PER WEEK UNTIL 300 MG PER DAY  . atenolol (TENORMIN) 50 MG tablet TAKE 1 TABLET BY MOUTH IN THE MORNING AND 1/2 TABLET AT NIGHT AS DIRECTED  . colchicine 0.6 MG tablet Take 0.6 mg by mouth as needed. For gout  . diclofenac sodium (VOLTAREN) 1 % GEL Apply topically as needed.   . fish oil-omega-3 fatty acids 1000 MG capsule Take 1 g by mouth daily.   Marland Kitchen gabapentin (  NEURONTIN) 300 MG capsule TAKE 1 CAPSULE (300 MG TOTAL) BY MOUTH DAILY.  Marland Kitchen levothyroxine (SYNTHROID, LEVOTHROID) 75 MCG tablet TAKE 1 TABLET BY MOUTH EVERY DAY  . lisinopril-hydrochlorothiazide (PRINZIDE,ZESTORETIC) 20-25 MG per tablet TAKE 1 TABLET BY MOUTH ONCE DAILY  . oxyCODONE (OXY IR/ROXICODONE) 5 MG immediate release tablet as needed.   . rivaroxaban (XARELTO) 20 MG TABS tablet Take 1 tablet (20 mg total) by mouth daily with supper.  . simvastatin (ZOCOR) 20 MG tablet TAKE 1 TABLET BY MOUTH EVERY EVENING  . traMADol (ULTRAM) 50 MG tablet as needed.   . Vitamin D, Ergocalciferol, (DRISDOL) 50000 UNITS CAPS capsule Take 50,000 Units by mouth every 7 (seven) days.   . [DISCONTINUED] allopurinol (ZYLOPRIM) 100 MG tablet TAKE 1 TABLET (100 MG TOTAL) BY MOUTH DAILY. INCREASE BY 100 MG PER WEEK UNTIL 300 MG PER DAY    EXAM:  BP 112/60  Pulse 93  Temp(Src) 98.7 F (37.1 C) (Oral)  Ht _0  (1.549 m)  SpO2 95%  LMP 05/06/2013  There is no weight on file to calculate BMI.  GENERAL: vitals reviewed and listed above, alert, oriented, appears well hydrated and in no acute distresslooks well  has cane HEENT: atraumatic, conjunctiva  clear, no obvious abnormalities on inspection of external nose and ears tmx nad nares min congestion  Op red pnd tracts  Face non tender   NECK: no obvious masses on inspection palpation  Neg jvd  LUNGS: clear to auscultation bilaterally, no wheezes, rales or rhonchi,  CV: HRRR, no clubbing cyanosis  Minimal  edema nl cap refill  MS: moves all extremities walks with cane  PSYCH: pleasant and cooperative, no obvious depression or anxiety BP Readings from Last 3 Encounters:  03/18/14 112/60  03/05/14 112/66  02/08/14 130/84   Wt Readings from Last 3 Encounters:  02/08/14 324 lb 12.8 oz (147.328 kg)  01/30/14 319 lb (144.697 kg)  01/20/14 314 lb 5 oz (142.571 kg)     ASSESSMENT AND PLAN:  Discussed the following assessment and plan:  Cough - no eveidence of pna or chf today  poss from pnd viral or environm consider antibiotic if prolonged or progresses pt denies aceias a cause  Post-nasal drainage  Chronic anticoagulation  Morbid obesity  -Patient advised to return or notify health care team  if symptoms worsen ,persist or new concerns arise.  Patient Instructions  Lung exam is good  Today. I see that the drainage could bee adding to your cough . Should get better with time. Try antihistamine  Like generic .  Zyrtec    Without the D.    If  Not Getting improved in the next week  Or  persistent infected looking phlegm   Contact us sometimes antibiotic can help but often resolves on ow. Recheck if fever  Shortness of breath etc.    Standley Brooking. Panosh M.D.

## 2014-03-18 NOTE — Telephone Encounter (Signed)
Ok to refill x 1  

## 2014-03-19 ENCOUNTER — Other Ambulatory Visit: Payer: Self-pay | Admitting: Family Medicine

## 2014-03-19 ENCOUNTER — Encounter: Payer: Self-pay | Admitting: Internal Medicine

## 2014-03-19 MED ORDER — SULFAMETHOXAZOLE-TMP DS 800-160 MG PO TABS: 1.0000 | ORAL_TABLET | Freq: Two times a day (BID) | ORAL | Status: DC

## 2014-03-19 NOTE — Telephone Encounter (Signed)
Sounds like uti  If not allergic to sulfa    rx septra ds 1 po bid   Disp 10  Ok to use azo in the meantime for pain relief .  If persistent then plan recheck  Urine etc

## 2014-03-20 NOTE — Telephone Encounter (Signed)
Called to the pharmacy and left on voicemail. 

## 2014-03-22 ENCOUNTER — Other Ambulatory Visit: Payer: Self-pay | Admitting: Internal Medicine

## 2014-03-25 NOTE — Telephone Encounter (Signed)
Sent to the pharmacy by e-scribe. 

## 2014-04-01 ENCOUNTER — Other Ambulatory Visit: Payer: Self-pay | Admitting: Internal Medicine

## 2014-04-01 NOTE — Telephone Encounter (Signed)
Sent by e-scribe.  Pt has upcoming appt on 07/23/14

## 2014-04-21 ENCOUNTER — Telehealth: Payer: Self-pay | Admitting: Internal Medicine

## 2014-04-21 ENCOUNTER — Other Ambulatory Visit: Payer: Self-pay | Admitting: Internal Medicine

## 2014-04-21 DIAGNOSIS — L719 Rosacea, unspecified: Secondary | ICD-10-CM

## 2014-04-21 NOTE — Telephone Encounter (Signed)
Pt states this is her regular dr. And she has rosacea.

## 2014-04-21 NOTE — Telephone Encounter (Signed)
Pt states she now has humana gold, and is needing a referral to her dermotologist, dr. Girard Cooter lu[place]. Pt has appt next week.

## 2014-04-21 NOTE — Telephone Encounter (Signed)
Need to know the reason for referral.  Please ask pt.  Thanks!

## 2014-04-21 NOTE — Telephone Encounter (Signed)
Sent to the pharmacy by e-scribe. 

## 2014-04-25 DIAGNOSIS — Z0289 Encounter for other administrative examinations: Secondary | ICD-10-CM

## 2014-04-25 DIAGNOSIS — Z0279 Encounter for issue of other medical certificate: Secondary | ICD-10-CM

## 2014-04-29 ENCOUNTER — Telehealth: Payer: Self-pay | Admitting: Cardiovascular Disease

## 2014-04-29 NOTE — Telephone Encounter (Signed)
8.25.15 Received Disability forms from [employer] for Dr Gwenlyn Found to sign.   Sent to [employer] @ [place] on 8.25.15

## 2014-04-30 NOTE — Telephone Encounter (Signed)
Referral placed in the system.

## 2014-05-01 ENCOUNTER — Encounter: Payer: Self-pay | Admitting: Internal Medicine

## 2014-05-01 NOTE — Progress Notes (Unsigned)
Disability form has been completed .

## 2014-05-02 NOTE — Progress Notes (Unsigned)
Subjective:    Patient ID: [name], female    DOB: 17-Nov-1950, 63 y.o.   MRN: [id number]  HPI    Review of Systems     Objective:   Physical Exam        Assessment & Plan:  November is 6 months    Would  still do labs unless others have done them elsewhere

## 2014-05-05 NOTE — Telephone Encounter (Signed)
Forms received and await signature from Dr Gwenlyn Found

## 2014-05-05 NOTE — Telephone Encounter (Signed)
8.31.15 Rec'd [employer] Statement of Disability form from [place] @ [place].  Given to Marylu Lund, RN to have Dr Gwenlyn Found sign and return.  lp

## 2014-05-08 NOTE — Telephone Encounter (Signed)
Forms signed by Dr Gwenlyn Found and handed to [place]

## 2014-05-08 NOTE — Telephone Encounter (Signed)
9.3.15 Completed signed [employer] Statement [employer] Disabiltity received from Dr Gwenlyn Found. Contacted patient and she wants paperwork faxed to [employer] and she will pick up originals . lp 05/08/14

## 2014-05-18 ENCOUNTER — Other Ambulatory Visit: Payer: Self-pay | Admitting: Internal Medicine

## 2014-05-20 NOTE — Telephone Encounter (Signed)
?  Who is writing for which pain meds   tramadol and  Oxycodone  I cant tell from EHR  Routine prescriber

## 2014-05-21 NOTE — Telephone Encounter (Signed)
Spoke to the pt.  She is no longer taking oxycodone.  Took it one time.  Did not like the effects and did not seem to help.  This was prescribed by Dr. Nelva Bush and his PA.  I have removed it from the medication list.  WP has been filling the tramadol.  Will send back to [facility] for further instruction.

## 2014-05-22 NOTE — Telephone Encounter (Signed)
Thanks for the clarification. Ok to refill x 2

## 2014-05-22 NOTE — Telephone Encounter (Signed)
Called to the pharmacy and left on voicemail.

## 2014-05-27 ENCOUNTER — Encounter: Payer: Self-pay | Admitting: Cardiology

## 2014-05-27 ENCOUNTER — Ambulatory Visit (INDEPENDENT_AMBULATORY_CARE_PROVIDER_SITE_OTHER): Payer: Commercial Managed Care - HMO | Admitting: Cardiology

## 2014-05-27 VITALS — BP 116/78 | HR 81 | Ht 62.0 in | Wt 306.3 lb

## 2014-05-27 DIAGNOSIS — G4733 Obstructive sleep apnea (adult) (pediatric): Secondary | ICD-10-CM

## 2014-05-27 DIAGNOSIS — I482 Chronic atrial fibrillation, unspecified: Secondary | ICD-10-CM

## 2014-05-27 DIAGNOSIS — Z7901 Long term (current) use of anticoagulants: Secondary | ICD-10-CM

## 2014-05-27 DIAGNOSIS — I4891 Unspecified atrial fibrillation: Secondary | ICD-10-CM

## 2014-05-27 DIAGNOSIS — E785 Hyperlipidemia, unspecified: Secondary | ICD-10-CM

## 2014-05-27 DIAGNOSIS — I6350 Cerebral infarction due to unspecified occlusion or stenosis of unspecified cerebral artery: Secondary | ICD-10-CM

## 2014-05-27 DIAGNOSIS — I639 Cerebral infarction, unspecified: Secondary | ICD-10-CM

## 2014-05-27 DIAGNOSIS — I10 Essential (primary) hypertension: Secondary | ICD-10-CM

## 2014-05-27 MED ORDER — RIVAROXABAN 20 MG PO TABS: 20.0000 mg | ORAL_TABLET | Freq: Every day | ORAL | Status: DC

## 2014-05-27 NOTE — Progress Notes (Signed)
05/27/2014 [name]   September 22, 1950  [id number]  Primary Physicia Stacy Dawson, MD Primary Cardiologist: Dr [name]  HPI:  63 y/o female followed by Dr [name] with CAF, HTN, and dyslipidemia. She is on Xarelto. She had a low risk Myoview and NL echo in 2012. She does have a history of prior Rt brain stroke 2010 and 2012 with no significant residual. She is here for a 6 month follow up. She has morbid obesity and severe DJD. She has had several knee replacements and may need Lt shoulder surgery. She has lost 6 lbs in the last 6 months. Overall she feels like she is doing OK although she does admit to some daytime fatigue.     Current Outpatient Prescriptions  Medication Sig Dispense Refill  . allopurinol (ZYLOPRIM) 100 MG tablet TAKE 2 TABLETs (200 MG TOTAL) BY MOUTH DAILY. INCREASE BY 100 MG PER WEEK UNTIL 300 MG PER DAY      . atenolol (TENORMIN) 50 MG tablet TAKE 1 TABLET BY MOUTH IN THE MORNING AND 1/2 TABLET AT NIGHT AS DIRECTED  135 tablet  2  . colchicine 0.6 MG tablet Take 0.6 mg by mouth as needed. For gout      . diclofenac sodium (VOLTAREN) 1 % GEL Apply topically as needed.       . fish oil-omega-3 fatty acids 1000 MG capsule Take 1 g by mouth daily.       Marland Kitchen levothyroxine (SYNTHROID, LEVOTHROID) 75 MCG tablet TAKE 1 TABLET BY MOUTH EVERY DAY  90 tablet  1  . lisinopril-hydrochlorothiazide (PRINZIDE,ZESTORETIC) 20-25 MG per tablet TAKE 1 TABLET BY MOUTH ONCE DAILY  90 tablet  1  . rivaroxaban (XARELTO) 20 MG TABS tablet Take 1 tablet (20 mg total) by mouth daily with supper.  60 tablet  0  . simvastatin (ZOCOR) 20 MG tablet TAKE 1 TABLET BY MOUTH ONCE EVERY EVENING  90 tablet  1  . traMADol (ULTRAM) 50 MG tablet TAKE 1 TABLET BY MOUTH 3 TIMES A DAY AS NEEDED FOR PAIN  90 tablet  1  . Vitamin D, Ergocalciferol, (DRISDOL) 50000 UNITS CAPS capsule Take 50,000 Units by mouth every 7 (seven) days.       . [DISCONTINUED] TOPAMAX 50 MG tablet Take 50 mg by mouth.       No  current facility-administered medications for this visit.    Allergies  Allergen Reactions  . Ambien [Zolpidem Tartrate]     Amnesia and fall   . Prevacid [Lansoprazole] Swelling  . Ace Inhibitors Cough  . Benadryl [Diphenhydramine Hcl]     topical  . Losartan     Elevated creatinine and swelling  . Motrin [Ibuprofen] Other (See Comments)    ANKLE EDEMA   . Prilosec [Omeprazole]     Swelling in ankles.    History   Social History  . Marital Status: Divorced    Spouse Name: N/A    Number of Children: N/A  . Years of Education: N/A   Occupational History  . Not on file.   Social History Main Topics  . Smoking status: Never Smoker   . Smokeless tobacco: Never Used  . Alcohol Use: 0.0 oz/week     Comment: occ  . Drug Use: No  . Sexual Activity: No   Other Topics Concern  . 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  Cardiovascular: negative for chest pain, dyspnea on exertion, edema, orthopnea, palpitations, paroxysmal nocturnal dyspnea or shortness of breath Dermatological: negative for rash Respiratory: negative for cough or wheezing Urologic: negative for hematuria Abdominal: negative for nausea, vomiting, diarrhea, bright red blood per rectum, melena, or hematemesis Neurologic: negative for visual changes, syncope, or dizziness All other systems reviewed and are otherwise negative except as noted above.    Blood pressure 116/78, pulse 81, height 5' 2" (1.575 m), weight 306 lb 4.8 oz (138.937 kg), last menstrual period 05/06/2013.  General appearance: alert, cooperative, no distress and morbidly obese Neck: no carotid bruit and no JVD Lungs: clear to auscultation bilaterally Heart: irregularly irregular rhythm  EKG AF with CVR, PVC  ASSESSMENT AND PLAN:    Chronic atrial fibrillation Rate controlled, she is unaware of her AF  CVA (cerebral vascular accident)  October 24, 2008 right medial frontal lobe infarct, and in Feb 2012  DJD (degenerative joint disease) She has had multiple knee replacements, Rt shoulder surgery, and may need Lt shoulder replacement.  OSA (obstructive sleep apnea) S/P surgery in 2012 with symptomatic improvement though she does complain of daytime fatigue  Morbid obesity BMI 56. She has lost 6 lbs in 6 months.  HYPERLIPIDEMIA Followed by PCP  Chronic anticoagulation On rIvaroxaban  Hypertension Echo 2012- EF 60%, LVH   PLAN  Same cardiac Rx. I told to let us know before she has any surgery so we can discuss with anticoagulation Dr [name]. I suggested we consider a sleep study if she starts to have daytime somnolence. She can f/u with Dr [name] in 6 months.   [name] KPA-C 05/27/2014 2:17 PM

## 2014-05-27 NOTE — Patient Instructions (Signed)
The PA wants you to follow-up in: 6 months with Dr. Andria Rhein will receive a reminder letter in the mail two months in advance. If you don't receive a letter, please call our office to schedule the follow-up appointment. No changes have been made today in your therapy.

## 2014-05-27 NOTE — Assessment & Plan Note (Signed)
She has had multiple knee replacements, Rt shoulder surgery, and may need Lt shoulder replacement.

## 2014-05-27 NOTE — Assessment & Plan Note (Signed)
On rIvaroxaban

## 2014-05-27 NOTE — Assessment & Plan Note (Signed)
Echo 2012- EF 60%, LVH

## 2014-05-27 NOTE — Assessment & Plan Note (Signed)
S/P surgery in 2012 with symptomatic improvement though she does complain of daytime fatigue

## 2014-05-27 NOTE — Assessment & Plan Note (Signed)
Followed by PCP

## 2014-05-27 NOTE — Assessment & Plan Note (Signed)
October 24, 2008 right medial frontal lobe infarct, and in Feb 2012

## 2014-05-27 NOTE — Assessment & Plan Note (Signed)
Rate controlled, she is unaware of her AF

## 2014-05-27 NOTE — Assessment & Plan Note (Signed)
BMI 56. She has lost 6 lbs in 6 months.

## 2014-06-09 ENCOUNTER — Telehealth: Payer: Self-pay | Admitting: *Deleted

## 2014-06-09 MED ORDER — VITAMIN D (ERGOCALCIFEROL) 1.25 MG (50000 UNIT) PO CAPS: 50000.0000 [IU] | ORAL_CAPSULE | ORAL | Status: DC

## 2014-06-09 NOTE — Telephone Encounter (Signed)
Fax from: [place] for Vitamin D 50,000 iu's Last refilled: 06/02/13 #26/1/ Refill Last AEX: 04/21/13 with Dr. Bernita Buffy Scheduled: 06/27/14 with Dr. [name]  Vitamin D 50,000 ius #30/0 refill sent to [facility] to last patient until AEX.  Routed to provider for review, encounter closed.

## 2014-06-10 ENCOUNTER — Ambulatory Visit (INDEPENDENT_AMBULATORY_CARE_PROVIDER_SITE_OTHER): Payer: Commercial Managed Care - HMO | Admitting: Family Medicine

## 2014-06-10 DIAGNOSIS — D519 Vitamin B12 deficiency anemia, unspecified: Secondary | ICD-10-CM

## 2014-06-10 DIAGNOSIS — Z23 Encounter for immunization: Secondary | ICD-10-CM

## 2014-06-10 MED ORDER — CYANOCOBALAMIN 1000 MCG/ML IJ SOLN
1000.0000 ug | Freq: Once | INTRAMUSCULAR | Status: AC
  Administered 2014-06-10: 1000 ug via INTRAMUSCULAR

## 2014-06-27 ENCOUNTER — Telehealth: Payer: Self-pay | Admitting: Obstetrics & Gynecology

## 2014-06-27 ENCOUNTER — Ambulatory Visit: Payer: BC Managed Care – PPO | Admitting: Obstetrics & Gynecology

## 2014-06-27 NOTE — Telephone Encounter (Signed)
Patient canceled her aex appointment today at 1:00pm with Dr.Miller. Patient woke up feeling ill and will call later to reschedule with [facility].

## 2014-06-27 NOTE — Telephone Encounter (Signed)
Thank you. Encounter closed.

## 2014-07-07 ENCOUNTER — Encounter: Payer: Self-pay | Admitting: Cardiology

## 2014-07-16 ENCOUNTER — Other Ambulatory Visit: Payer: Commercial Managed Care - HMO

## 2014-07-17 ENCOUNTER — Other Ambulatory Visit (INDEPENDENT_AMBULATORY_CARE_PROVIDER_SITE_OTHER): Payer: Commercial Managed Care - HMO

## 2014-07-17 DIAGNOSIS — R739 Hyperglycemia, unspecified: Secondary | ICD-10-CM

## 2014-07-17 LAB — BASIC METABOLIC PANEL
BUN: 33 mg/dL — AB (ref 6–23)
CALCIUM: 9.2 mg/dL (ref 8.4–10.5)
CO2: 29 mEq/L (ref 19–32)
Chloride: 103 mEq/L (ref 96–112)
Creatinine, Ser: 1 mg/dL (ref 0.4–1.2)
GFR: 56.54 mL/min — ABNORMAL LOW (ref >60.00)
Glucose, Bld: 112 mg/dL — ABNORMAL HIGH (ref 70–99)
Potassium: 4.3 mEq/L (ref 3.5–5.1)
Sodium: 139 mEq/L (ref 135–145)

## 2014-07-17 LAB — HEMOGLOBIN A1C: HEMOGLOBIN A1C: 6.2 % (ref 4.6–6.5)

## 2014-07-23 ENCOUNTER — Encounter: Payer: Self-pay | Admitting: Internal Medicine

## 2014-07-23 ENCOUNTER — Ambulatory Visit (INDEPENDENT_AMBULATORY_CARE_PROVIDER_SITE_OTHER): Payer: Commercial Managed Care - HMO | Admitting: Internal Medicine

## 2014-07-23 VITALS — BP 94/70 | HR 67 | Temp 97.6°F

## 2014-07-23 DIAGNOSIS — Z7901 Long term (current) use of anticoagulants: Secondary | ICD-10-CM

## 2014-07-23 DIAGNOSIS — R7309 Other abnormal glucose: Secondary | ICD-10-CM

## 2014-07-23 DIAGNOSIS — D519 Vitamin B12 deficiency anemia, unspecified: Secondary | ICD-10-CM

## 2014-07-23 DIAGNOSIS — E039 Hypothyroidism, unspecified: Secondary | ICD-10-CM

## 2014-07-23 DIAGNOSIS — Z8673 Personal history of transient ischemic attack (TIA), and cerebral infarction without residual deficits: Secondary | ICD-10-CM

## 2014-07-23 DIAGNOSIS — I10 Essential (primary) hypertension: Secondary | ICD-10-CM

## 2014-07-23 DIAGNOSIS — R7303 Prediabetes: Secondary | ICD-10-CM

## 2014-07-23 MED ORDER — CYANOCOBALAMIN 1000 MCG/ML IJ SOLN
1000.0000 ug | Freq: Once | INTRAMUSCULAR | Status: AC
  Administered 2014-07-23: 1000 ug via INTRAMUSCULAR

## 2014-07-23 NOTE — Patient Instructions (Signed)
Continue lifestyle intervention healthy eating and exercise . Healthy weight loss  Can only help your back and medical issues .  Continue cutting out simple sugars and  Simple carbs .  Wellness   Visit   cpx labs and hga1c  previsit  Or at visit  In 6 months .  BP  is on low side but you look like doing well. If getting lightheaded check BP readings.  To make sure not too low .
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
## 2014-07-30 ENCOUNTER — Encounter: Payer: Self-pay | Admitting: Internal Medicine

## 2014-08-04 ENCOUNTER — Other Ambulatory Visit: Payer: Self-pay | Admitting: Internal Medicine

## 2014-08-04 NOTE — Telephone Encounter (Signed)
Sent to the pharmacy by e-scribe.

## 2014-08-15 ENCOUNTER — Ambulatory Visit (INDEPENDENT_AMBULATORY_CARE_PROVIDER_SITE_OTHER): Payer: Commercial Managed Care - HMO | Admitting: Family Medicine

## 2014-08-15 ENCOUNTER — Encounter: Payer: Self-pay | Admitting: Family Medicine

## 2014-08-15 VITALS — BP 108/78 | HR 91 | Temp 98.5°F | Ht 62.0 in

## 2014-08-15 DIAGNOSIS — M25572 Pain in left ankle and joints of left foot: Secondary | ICD-10-CM
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
## 2014-08-15 NOTE — Patient Instructions (Signed)
-  please go get xray of foot

## 2014-08-15 NOTE — Progress Notes (Signed)
Pre visit review using our clinic review tool, if applicable. No additional management support is needed unless otherwise documented below in the visit note.

## 2014-08-19 ENCOUNTER — Ambulatory Visit (INDEPENDENT_AMBULATORY_CARE_PROVIDER_SITE_OTHER): Payer: Commercial Managed Care - HMO | Admitting: Sports Medicine

## 2014-08-19 ENCOUNTER — Encounter: Payer: Self-pay | Admitting: Sports Medicine

## 2014-08-19 VITALS — BP 132/86 | HR 87 | Ht 62.0 in | Wt 300.0 lb

## 2014-08-19 DIAGNOSIS — M79672 Pain in left foot: Secondary | ICD-10-CM

## 2014-08-19 MED ORDER — METHYLPREDNISOLONE 4 MG PO TABS: ORAL_TABLET | ORAL | Status: DC

## 2014-08-19 NOTE — Progress Notes (Signed)
Subjective:    Patient ID: [name], female    DOB: 11-11-1950, 63 y.o.   MRN: [id number]  HPI [name] is a 63 year old female who presents with left foot pain for the past 5 days or so. She denies any acute injury. Prior to onset of symptoms, she was more active with walking because she says her pain was well controlled. She does have a history significant for a metatarsal stress fracture in the right foot there was previously seen by Dr. Micheline Chapman a year and a half ago. This stress fracture was seen on ultrasound, but not on x-ray. 4 days ago she saw a primary care physician, who prescribed Voltaren gel and tramadol, but the patient was desiring a prednisone Dosepak. She is able to obtain a 6 day Medrol Dosepak through the [facility] urgent care, which she has 2 days left on the prescription. She says that her symptoms are significantly improved today since starting the Medrol Dosepak. She wanted to come in for evaluation to make sure nothing else was going on. Her history is complicated by obesity, gout, foot tendinitis, hypertension, and neuropathy. In addition, she also has chronically flattened longitudinal arches and intermittent left ankle swelling. Location of pain is primarily in the left lateral dorsal foot over the fourth proximal metatarsal. She denies any associated fevers, chills, numbness, tingling, shortness of breath, or chest pain.  Past medical history, social history, medications, and allergies were reviewed and are up to date in the chart.  Review of Systems 7 point review of systems was performed and was otherwise negative unless noted in the history of present illness.     Objective:   Physical Exam  BP 132/86 mmHg  Pulse 87  Ht 5' 2" (1.575 m)  Wt 300 lb (136.079 kg)  BMI 54.86 kg/m2  LMP 05/06/2013 GEN: The patient is well-developed well-nourished female and in no acute distress.  She is awake alert and oriented x3. SKIN: warm and well-perfused, no rash    Neuro: Strength 5/5 globally. Sensation intact throughout. No focal deficits. Vasc: +2 bilateral distal pulses. Diffuse non-pitting bilateral +1 edema to ankles  MSK: Examination of the left foot reveals a significantly flattened medial longitudinal arch with hyperpronation at the ankle. She has no local swelling but there is diffuse swelling of the bilateral ankles. There is no localized bruising or induration. There is some tenderness to palpation at the left fourth tarsometatarsal joint. She otherwise has full range of motion of the ankle and toes. She is not wearing supportive footwear.  Limited musculoskeletal ultrasound: Long and short axis views were obtained of the left foot. There appear to be arthritic changes in the midfoot, specifically of the left fourth tarsometatarsal joint with mild surrounding fluid edema. This is also the point of maximal tenderness to palpation. Examination of the fourth metatarsal shaft was performed which did not show any cortical defect, neovascularization, or edema around the shaft. No acute fracture or stress fracture was visualized on ultrasound.     Assessment & Plan:  Please see problem based assessment and plan in the problem list.

## 2014-08-19 NOTE — Assessment & Plan Note (Signed)
[place] negative for acute fracture. +Arthritic changes with mild surrounding hypoechoic fluid edema without any cortical disruption or neovascularity at 4th tarso-metatarsal joint proximally. Could be early stress reaction. -Patient has post-op shoe at home, which she will wear if her pain persists despite metatarsal strap. Overall, sx significantly improved today and almost resolved. -Finish Medrol Dose Pack -Recommend metatarsal strap, which she already has -Offered body helix compression, but she says this was too tight in the past -Recommend supportive footwear, consideration of sport insoles if persists -Continue water aerobics -If has worsening pain, swelling, or for any other concern, call for an appt. Would get an x-ray at that time, I explained to her. -Follow-up as needed.

## 2014-09-05 HISTORY — PX: OTHER SURGICAL HISTORY: SHX169

## 2014-09-10 ENCOUNTER — Ambulatory Visit (INDEPENDENT_AMBULATORY_CARE_PROVIDER_SITE_OTHER): Payer: PPO | Admitting: Family Medicine

## 2014-09-10 DIAGNOSIS — D519 Vitamin B12 deficiency anemia, unspecified: Secondary | ICD-10-CM

## 2014-09-10 MED ORDER — CYANOCOBALAMIN 1000 MCG/ML IJ SOLN
1000.0000 ug | Freq: Once | INTRAMUSCULAR | Status: AC
  Administered 2014-09-10: 1000 ug via INTRAMUSCULAR

## 2014-09-16 ENCOUNTER — Telehealth: Payer: Self-pay | Admitting: Cardiovascular Disease

## 2014-09-16 NOTE — Telephone Encounter (Signed)
Deanna is calling to get clearance for Mrs Braun to come off of her Xarelto 3 days before she gets an injection in her back . Please call    Thanks

## 2014-09-16 NOTE — Telephone Encounter (Signed)
Message routed to Dr. Gwenlyn Found & [place], RN to review and advise on holding xarelto for back injection

## 2014-09-17 NOTE — Telephone Encounter (Signed)
OK to stop NOAC 3 days prior to back injection and restart back afterwards

## 2014-09-17 NOTE — Telephone Encounter (Signed)
Faxed note to [phone] Spoke with Deanna to let her know fax would be coming to her.

## 2014-09-21 ENCOUNTER — Other Ambulatory Visit: Payer: Self-pay | Admitting: Internal Medicine

## 2014-09-22 NOTE — Telephone Encounter (Signed)
Sent to the pharmacy by e-scribe. 

## 2014-10-03 ENCOUNTER — Ambulatory Visit
Admission: RE | Admit: 2014-10-03 | Discharge: 2014-10-03 | Disposition: A | Discharge status: Home / Self Care | Payer: PPO | Source: Ambulatory Visit | Attending: Sports Medicine | Admitting: Sports Medicine

## 2014-10-03 ENCOUNTER — Encounter: Payer: Self-pay | Admitting: *Deleted

## 2014-10-03 DIAGNOSIS — M79672 Pain in left foot: Secondary | ICD-10-CM

## 2014-10-03 MED ORDER — PREDNISONE (PAK) 10 MG PO TABS: ORAL_TABLET | Freq: Every day | ORAL | Status: DC

## 2014-10-10 ENCOUNTER — Other Ambulatory Visit: Payer: Self-pay | Admitting: Internal Medicine

## 2014-10-10 NOTE — Telephone Encounter (Signed)
Ok to refill x 2

## 2014-10-13 NOTE — Telephone Encounter (Signed)
Called to the pharmacy and left on voicemail. 

## 2014-10-14 ENCOUNTER — Other Ambulatory Visit: Payer: Self-pay | Admitting: Internal Medicine

## 2014-10-15 ENCOUNTER — Other Ambulatory Visit: Payer: Self-pay | Admitting: Internal Medicine

## 2014-10-15 ENCOUNTER — Encounter: Payer: Self-pay | Admitting: Internal Medicine

## 2014-10-15 NOTE — Telephone Encounter (Signed)
Sent to the pharmacy by e-scribe.  Pt has CPX scheduled for 01/27/15

## 2014-10-15 NOTE — Telephone Encounter (Signed)
Denied.  Filled on 10/13/14

## 2014-11-06 ENCOUNTER — Encounter: Payer: Self-pay | Admitting: *Deleted

## 2014-11-07 ENCOUNTER — Other Ambulatory Visit: Payer: Self-pay | Admitting: Obstetrics & Gynecology

## 2014-11-07 DIAGNOSIS — Z1231 Encounter for screening mammogram for malignant neoplasm of breast: Secondary | ICD-10-CM

## 2014-11-18 ENCOUNTER — Other Ambulatory Visit: Payer: Self-pay | Admitting: Internal Medicine

## 2014-11-18 ENCOUNTER — Ambulatory Visit: Payer: Commercial Managed Care - HMO | Admitting: Cardiovascular Disease

## 2014-11-18 NOTE — Telephone Encounter (Signed)
Sent to the pharmacy by e-scribe. Spoke to the pt.  She is only taking 2 tabs daily.  Has not increased to 3 tabs daily because two tabs works well.

## 2014-12-02 ENCOUNTER — Telehealth: Payer: Self-pay | Admitting: *Deleted

## 2014-12-02 NOTE — Telephone Encounter (Signed)
Okay to hold her Xarelto for 2 days for her orthopedic procedure.

## 2014-12-02 NOTE — Telephone Encounter (Signed)
[facility] is requesting medical clearance for patient to hold xarelto prior to a lumbar RF neurotomy injection

## 2014-12-03 NOTE — Telephone Encounter (Signed)
Message routed to [place] at [facility].

## 2014-12-15 ENCOUNTER — Ambulatory Visit (HOSPITAL_COMMUNITY)
Admission: RE | Admit: 2014-12-15 | Discharge: 2014-12-15 | Disposition: A | Discharge status: Home / Self Care | Payer: PPO | Source: Ambulatory Visit | Attending: Obstetrics & Gynecology | Admitting: Obstetrics & Gynecology

## 2014-12-15 DIAGNOSIS — Z1231 Encounter for screening mammogram for malignant neoplasm of breast: Secondary | ICD-10-CM | POA: Diagnosis present

## 2014-12-17 ENCOUNTER — Encounter: Payer: Self-pay | Admitting: Cardiovascular Disease

## 2014-12-17 ENCOUNTER — Ambulatory Visit (INDEPENDENT_AMBULATORY_CARE_PROVIDER_SITE_OTHER): Payer: PPO | Admitting: Cardiovascular Disease

## 2014-12-17 VITALS — HR 81 | Ht 62.0 in | Wt 316.0 lb

## 2014-12-17 DIAGNOSIS — I482 Chronic atrial fibrillation, unspecified: Secondary | ICD-10-CM

## 2014-12-17 DIAGNOSIS — I10 Essential (primary) hypertension: Secondary | ICD-10-CM | POA: Diagnosis not present

## 2014-12-17 NOTE — Assessment & Plan Note (Signed)
History of chronic atrial fibrillation rate controlled on Xarelto oral anticoagulation.

## 2014-12-17 NOTE — Progress Notes (Signed)
12/17/2014 [name]   Mar 19, 1951  [id number]  Primary Physician [name], MD Primary Cardiologist: [name] MD [name]   HPI:   The patient is a 64 year old, morbidly overweight, divorced Caucasian female, mother of 33, grandmother to 2 grandchildren who I last saw in the office 12 months ago. She has a history of hypertension and hyperlipidemia. She has had a stroke in the past and has paroxysmal A-fib now in chronic A-fib on Xarelto anticoagulation. She is rate controlled. She has had an uvulopalatopharyngoplasty which improved her symptoms of sleep apnea. Dr. [name] follows her lipid profile and apparently recently stopped her Zocor. A 2D echo showed normal LV function with mild left atrial enlargement and a Myoview stress test performed December 08, 2010, was low risk. She is otherwise asymptomaticshe is on simvastatin therapy with excellent profile for primary prevention performed 04/09/13 by her PCP. Otherwise, she is asymptomatic and specifically denies chest pain or shortness of breath.   Current Outpatient Prescriptions  Medication Sig Dispense Refill  . allopurinol (ZYLOPRIM) 100 MG tablet Take 2 tablets (200 mg total) by mouth daily. 180 tablet 0  . atenolol (TENORMIN) 50 MG tablet TAKE 1 TABLET BY MOUTH IN THE MORNING AND 1/2 TABLET AT NIGHT AS DIRECTED 135 tablet 2  . colchicine 0.6 MG tablet Take 0.6 mg by mouth as needed. For gout    . diclofenac sodium (VOLTAREN) 1 % GEL Apply topically as needed.     . fish oil-omega-3 fatty acids 1000 MG capsule Take 1 g by mouth daily.     Marland Kitchen levothyroxine (SYNTHROID, LEVOTHROID) 75 MCG tablet TAKE 1 TABLET BY MOUTH EVERY DAY 90 tablet 1  . lisinopril-hydrochlorothiazide (PRINZIDE,ZESTORETIC) 20-25 MG per tablet TAKE 1 TABLET BY MOUTH ONCE DAILY 90 tablet 1  . rivaroxaban (XARELTO) 20 MG TABS tablet Take 1 tablet (20 mg total) by mouth daily with supper. 60 tablet 0  . simvastatin (ZOCOR) 20 MG  tablet TAKE 1 TABLET BY MOUTH ONCE EVERY EVENING 90 tablet 1  . traMADol (ULTRAM) 50 MG tablet TAKE 1 TABLET 3 TIMES A DAY AS NEEDED FOR PAIN 90 tablet 1  . Vitamin D, Ergocalciferol, (DRISDOL) 50000 UNITS CAPS capsule Take 1 capsule (50,000 Units total) by mouth every 7 (seven) days. 30 capsule 0  . [DISCONTINUED] TOPAMAX 50 MG tablet Take 50 mg by mouth.     No current facility-administered medications for this visit.    Allergies  Allergen Reactions  . Ambien [Zolpidem Tartrate]     Amnesia and fall   . Prevacid [Lansoprazole] Swelling  . Ace Inhibitors Cough  . Benadryl [Diphenhydramine Hcl]     topical  . Losartan     Elevated creatinine and swelling  . Motrin [Ibuprofen] Other (See Comments)    ANKLE EDEMA   . Prilosec [Omeprazole]     Swelling in ankles.    History   Social History  . Marital Status: Divorced    Spouse Name: N/A  . Number of Children: N/A  . Years of Education: N/A   Occupational History  . Not on file.   Social History Main Topics  . Smoking status: Never Smoker   . Smokeless tobacco: Never Used  . Alcohol Use: 0.0 oz/week     Comment: occ  . Drug Use: No  . Sexual Activity: No   Other Topics Concern  . 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  Cardiovascular: negative for chest pain, dyspnea on exertion, edema, orthopnea, palpitations, paroxysmal nocturnal dyspnea or shortness of breath Dermatological: negative for rash Respiratory: negative for cough or wheezing Urologic: negative for hematuria Abdominal: negative for nausea, vomiting, diarrhea, bright red blood per rectum, melena, or hematemesis Neurologic: negative for visual changes, syncope, or dizziness All other systems reviewed and are otherwise negative except as noted  above.    Pulse 81, height _0  (1.575 m), weight 316 lb (143.337 kg), last menstrual period 05/06/2013.  General appearance: alert and no distress Neck: no adenopathy, no carotid bruit, no JVD, supple, symmetrical, trachea midline and thyroid not enlarged, symmetric, no tenderness/mass/nodules Lungs: clear to auscultation bilaterally Heart: irregularly irregular rhythm Extremities: extremities normal, atraumatic, no cyanosis or edema  EKG atrial fibrillation with a ventricular response of 81 and nonspecific ST and T-wave changes with low voltage in limb leads. I personally reviewed this EKG  ASSESSMENT AND PLAN:   Hypertension History of hypertension blood pressure measured at 112/70. She is on atenolol. Continue current meds at current dosing   HYPERLIPIDEMIA History of hyperlipidemia on simvastatin  20 mg a day followed by her PCP   Chronic atrial fibrillation History of chronic atrial fibrillation rate controlled on Xarelto oral anticoagulation.       [name] MD FACP,FACC,FAHA, [facility] 12/17/2014 12:19 PM

## 2014-12-17 NOTE — Patient Instructions (Signed)
Your physician wants you to follow-up in: 1 year with Dr Gwenlyn Found. You will receive a reminder letter in the mail two months in advance. If you don't receive a letter, please call our office to schedule the follow-up appointment.

## 2014-12-17 NOTE — Assessment & Plan Note (Addendum)
History of hyperlipidemia on simvastatin  20 mg a day followed by her PCP

## 2014-12-17 NOTE — Assessment & Plan Note (Signed)
History of hypertension blood pressure measured at 112/70. She is on atenolol. Continue current meds at current dosing

## 2014-12-19 ENCOUNTER — Other Ambulatory Visit: Payer: Self-pay | Admitting: Internal Medicine

## 2014-12-19 NOTE — Telephone Encounter (Signed)
Sent to the pharmacy by e-scribe.  Pt has upcoming appt on May 31.

## 2014-12-31 ENCOUNTER — Other Ambulatory Visit: Payer: Self-pay | Admitting: Cardiovascular Disease

## 2014-12-31 ENCOUNTER — Encounter: Payer: Self-pay | Admitting: Cardiology

## 2014-12-31 MED ORDER — RIVAROXABAN 20 MG PO TABS
20.0000 mg | ORAL_TABLET | Freq: Every day | ORAL | Status: DC
Start: 2014-12-31 — End: 2015-01-19

## 2014-12-31 NOTE — Telephone Encounter (Signed)
Pt would like some samples of Xarelto please. She will be next door for an appt at 1:30. She would like to pick them up while she is there.

## 2014-12-31 NOTE — Telephone Encounter (Signed)
Samples (3  bottles) left at front desk

## 2015-01-14 ENCOUNTER — Other Ambulatory Visit: Payer: Self-pay | Admitting: Cardiovascular Disease

## 2015-01-19 ENCOUNTER — Telehealth: Payer: Self-pay | Admitting: Cardiovascular Disease

## 2015-01-19 MED ORDER — RIVAROXABAN 20 MG PO TABS: 20.0000 mg | ORAL_TABLET | Freq: Every day | ORAL | Status: DC

## 2015-01-19 NOTE — Telephone Encounter (Signed)
Pt of Dr. Gwenlyn Found - on Xarelto for chronic a fib.  needs OK to hold Xarelto for Thursday procedure (cataract surgery).   Routing to [employer] to advise.

## 2015-01-19 NOTE — Telephone Encounter (Signed)
Do not need to hold anticoagulants (Xarelto) for cataract surgery.

## 2015-01-19 NOTE — Telephone Encounter (Signed)
Pt is having cataract surgery on  Thursday. Does she need to stop her Xarelto? Also need some samples of Xarelto please.

## 2015-01-19 NOTE — Telephone Encounter (Signed)
Pt advised on Kristin's instructions - Xarelto samples left at front desk. Pt verbalized acknowledgement.

## 2015-01-20 ENCOUNTER — Other Ambulatory Visit: Payer: Commercial Managed Care - HMO

## 2015-01-22 ENCOUNTER — Encounter: Payer: Self-pay | Admitting: Internal Medicine

## 2015-01-23 ENCOUNTER — Other Ambulatory Visit (INDEPENDENT_AMBULATORY_CARE_PROVIDER_SITE_OTHER): Payer: PPO

## 2015-01-23 ENCOUNTER — Other Ambulatory Visit: Payer: Self-pay | Admitting: Family Medicine

## 2015-01-23 DIAGNOSIS — R300 Dysuria: Secondary | ICD-10-CM

## 2015-01-23 LAB — POCT URINALYSIS DIPSTICK
Glucose, UA: NEGATIVE
KETONES UA: NEGATIVE
Nitrite, UA: NEGATIVE
PROTEIN UA: NEGATIVE
Spec Grav, UA: 1.02
UROBILINOGEN UA: 2
pH, UA: 5

## 2015-01-23 MED ORDER — CEPHALEXIN 500 MG PO CAPS: 500.0000 mg | ORAL_CAPSULE | Freq: Three times a day (TID) | ORAL | Status: DC

## 2015-01-26 LAB — URINE CULTURE: Colony Count: >=100000

## 2015-01-27 ENCOUNTER — Encounter: Payer: Commercial Managed Care - HMO | Admitting: Internal Medicine

## 2015-01-31 NOTE — Progress Notes (Signed)
Quick Note:  Tell patient that urine culture shows e coli sensitive to medication given . Should resolve with current treatment .FU if not better. ______

## 2015-02-03 ENCOUNTER — Encounter: Payer: PPO | Admitting: Internal Medicine

## 2015-02-03 ENCOUNTER — Ambulatory Visit: Payer: Commercial Managed Care - HMO | Admitting: Neurology

## 2015-02-04 ENCOUNTER — Telehealth: Payer: Self-pay | Admitting: Cardiovascular Disease

## 2015-02-04 ENCOUNTER — Encounter: Payer: Self-pay | Admitting: *Deleted

## 2015-02-04 NOTE — Telephone Encounter (Signed)
Okay to hold her oral and coagulum for back injection, restart afterwards when Dr. Nelva Bush thinks that it's safe to do so

## 2015-02-04 NOTE — Telephone Encounter (Signed)
Pt requesting clearance for injection by Dr. Nelva Bush - needs OK to hold Xarelto 3 days prior.  Routing to Dr. Gwenlyn Found.

## 2015-02-04 NOTE — Telephone Encounter (Signed)
Clearance faxed to Deanna at number provided.

## 2015-02-04 NOTE — Telephone Encounter (Signed)
Needs clarence for pt to stop Xarelto 3 days before injection.Please fax to [facility] [place] [phone].

## 2015-02-06 ENCOUNTER — Encounter: Payer: Self-pay | Admitting: Internal Medicine

## 2015-02-06 MED ORDER — SULFAMETHOXAZOLE-TRIMETHOPRIM 800-160 MG PO TABS: 1.0000 | ORAL_TABLET | Freq: Two times a day (BID) | ORAL | Status: DC

## 2015-02-06 NOTE — Telephone Encounter (Signed)
reveiwed record  And acts like relapse of prev infection   Will need ov   Repeat cx if recurs or no better

## 2015-02-10 ENCOUNTER — Ambulatory Visit (INDEPENDENT_AMBULATORY_CARE_PROVIDER_SITE_OTHER): Payer: PPO | Admitting: Neurology

## 2015-02-10 ENCOUNTER — Encounter: Payer: Self-pay | Admitting: Neurology

## 2015-02-10 VITALS — BP 118/73 | HR 55 | Wt 320.0 lb

## 2015-02-10 DIAGNOSIS — I6990 Unspecified sequelae of unspecified cerebrovascular disease: Secondary | ICD-10-CM | POA: Diagnosis not present

## 2015-02-10 NOTE — Patient Instructions (Addendum)
I had a long d/w patient about her remote stroke, risk for recurrent stroke/TIAs, personally independently reviewed imaging studies and stroke evaluation results and answered questions.Continue xarelto  for secondary stroke prevention and maintain strict control of hypertension with blood pressure goal below 130/90, diabetes with hemoglobin A1c goal below 6.5% and lipids with LDL cholesterol goal below 70 mg/dL.  Continue CPAP for sleep apnea.I also advised the patient to eat a healthy diet with plenty of whole grains, cereals, fruits and vegetables, exercise regularly and maintain ideal body weight Followup in the future with me in  1 year or call earlier if necessary.
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
## 2015-02-16 ENCOUNTER — Telehealth: Payer: Self-pay | Admitting: Cardiovascular Disease

## 2015-02-16 MED ORDER — RIVAROXABAN 20 MG PO TABS: 20.0000 mg | ORAL_TABLET | Freq: Every day | ORAL | Status: DC

## 2015-02-16 NOTE — Telephone Encounter (Signed)
Patient aware samples are at the front desk for pick up  

## 2015-02-16 NOTE — Telephone Encounter (Signed)
[name] is calling to see if she can get some samples of Xarelto . Please call    Thanks

## 2015-02-18 ENCOUNTER — Other Ambulatory Visit: Payer: Self-pay | Admitting: Internal Medicine

## 2015-02-18 NOTE — Telephone Encounter (Signed)
Looks like pt is due for lab work.  Please advise.  Thanks!

## 2015-02-19 ENCOUNTER — Other Ambulatory Visit: Payer: Self-pay | Admitting: Family Medicine

## 2015-02-19 DIAGNOSIS — Z0000 Encounter for general adult medical examination without abnormal findings: Secondary | ICD-10-CM

## 2015-02-19 DIAGNOSIS — R7303 Prediabetes: Secondary | ICD-10-CM

## 2015-02-19 NOTE — Telephone Encounter (Signed)
Has cpx and labs in august. Make sure A1c  Also ordered with cpx Can refill until gets cpx.

## 2015-02-19 NOTE — Telephone Encounter (Signed)
Sent to the pharmacy by e-scribe.  Orders for lab work placed in the system.

## 2015-03-07 ENCOUNTER — Other Ambulatory Visit: Payer: Self-pay | Admitting: Internal Medicine

## 2015-03-10 NOTE — Telephone Encounter (Signed)
Ok x 1

## 2015-03-10 NOTE — Telephone Encounter (Signed)
Called to the pharmacy and left on machine.

## 2015-03-11 ENCOUNTER — Telehealth: Payer: Self-pay | Admitting: Cardiovascular Disease

## 2015-03-11 NOTE — Telephone Encounter (Signed)
Patient calling the office for samples of medication:   1.  What medication and dosage are you requesting samples for? Xarelto  2.  Are you currently out of this medication? Yes   3. Are you requesting samples to get you through until a mail order prescription arrives? No, she has just reached the donut hole

## 2015-03-11 NOTE — Telephone Encounter (Signed)
Medication samples have been provided to the patient.  Drug name: xarelto 59   Qty: 25  LOT: [id number]  Exp.Date: 02/2017  Samples left at front desk for patient pick-up. Patient notified.  Sheral Apley M 11:06 AM 03/11/2015

## 2015-03-13 ENCOUNTER — Telehealth: Payer: Self-pay | Admitting: Cardiovascular Disease

## 2015-03-13 NOTE — Telephone Encounter (Signed)
Left message instructing pt to call back.

## 2015-03-13 NOTE — Telephone Encounter (Signed)
Spoke w/ patient regarding Xarelto. She states some bruising on forearms. Reason for call was nurse at orthopedic office noted bruising and asked her to f/u w/ [place] to see if current dose indicated.  Advised yes, 35m Xarelto daily recommended unless impaired renal function. Pt voiced understanding. Advised her to continue current dose - if further issues w/ bruising, [place] - always option to go on coumadin. Pt did state she would not want to go on coumadin.  Conferred w/ KErasmo DownerPharmD who agreed w/ my recommendations.

## 2015-03-13 NOTE — Telephone Encounter (Signed)
Please call,question about her Xarelto and bruising.

## 2015-03-15 ENCOUNTER — Other Ambulatory Visit: Payer: Self-pay | Admitting: Internal Medicine

## 2015-03-16 NOTE — Telephone Encounter (Signed)
Sent to the pharmacy by e-scribe.  Pt has upcoming cpx on 04/22/15

## 2015-03-31 ENCOUNTER — Telehealth: Payer: Self-pay | Admitting: Cardiovascular Disease

## 2015-03-31 NOTE — Telephone Encounter (Signed)
Patient is scheduled for back injection on 04/17/15. How many days prior does she need to stop her Xarelto?

## 2015-03-31 NOTE — Telephone Encounter (Signed)
Will defer?  Forward to [facility], Dr Gwenlyn Found,

## 2015-03-31 NOTE — Telephone Encounter (Signed)
Pt with hx of CVA in 2012, thought to be caused by AFib.  No problems since then.  Would recommend to hold Xarelto x 3 days for spinal injection, please confirm with Dr. Gwenlyn Found due to stroke hx.  Pt needs to be aware of risk.

## 2015-03-31 NOTE — Telephone Encounter (Signed)
Dr Gwenlyn Found, please advise

## 2015-04-01 NOTE — Telephone Encounter (Signed)
Since the patient had a stroke while in A. Fib in the past and now is on Xareltoneeds a lumbar procedure most likely will require Lovenox bridging..refer to Cyril Mourning to coordinate

## 2015-04-02 NOTE — Telephone Encounter (Signed)
Letter routed to [place] at [employer]

## 2015-04-02 NOTE — Telephone Encounter (Signed)
Reviewed with Dr. Gwenlyn Found.  Pt will need to be off Xarelto for 3 days prior to lumbar procedure.  Is at high risk due to previous stroke, but for spinal procedures it is often recommended that last dose of lovenox be 36 hours prior to procedure.  In this case, patient would only have 1 dose of lovenox.  Dr. Gwenlyn Found agreed to hold Xarelto x 3 days and no lovenox.

## 2015-04-15 ENCOUNTER — Other Ambulatory Visit (INDEPENDENT_AMBULATORY_CARE_PROVIDER_SITE_OTHER): Payer: PPO

## 2015-04-15 DIAGNOSIS — R7309 Other abnormal glucose: Secondary | ICD-10-CM | POA: Diagnosis not present

## 2015-04-15 DIAGNOSIS — R7303 Prediabetes: Secondary | ICD-10-CM

## 2015-04-15 DIAGNOSIS — Z0000 Encounter for general adult medical examination without abnormal findings: Secondary | ICD-10-CM

## 2015-04-15 LAB — BASIC METABOLIC PANEL
BUN: 30 mg/dL — ABNORMAL HIGH (ref 6–23)
CALCIUM: 9.5 mg/dL (ref 8.4–10.5)
CO2: 30 mEq/L (ref 19–32)
CREATININE: 1.11 mg/dL (ref 0.40–1.20)
Chloride: 106 mEq/L (ref 96–112)
GFR: 52.32 mL/min — ABNORMAL LOW (ref >60.00)
Glucose, Bld: 107 mg/dL — ABNORMAL HIGH (ref 70–99)
Potassium: 5.2 mEq/L — ABNORMAL HIGH (ref 3.5–5.1)
Sodium: 143 mEq/L (ref 135–145)

## 2015-04-15 LAB — CBC WITH DIFFERENTIAL/PLATELET
BASOS ABS: 0 10*3/uL (ref 0.0–0.1)
BASOS PCT: 0.4 % (ref 0.0–3.0)
EOS ABS: 0.2 10*3/uL (ref 0.0–0.7)
Eosinophils Relative: 2.1 % (ref 0.0–5.0)
HCT: 40.9 % (ref 36.0–46.0)
Hemoglobin: 13.4 g/dL (ref 12.0–15.0)
LYMPHS PCT: 12.8 % (ref 12.0–46.0)
Lymphs Abs: 1.1 10*3/uL (ref 0.7–4.0)
MCHC: 32.9 g/dL (ref 30.0–36.0)
MCV: 103.1 fl — AB (ref 78.0–100.0)
MONO ABS: 0.6 10*3/uL (ref 0.1–1.0)
Monocytes Relative: 6.7 % (ref 3.0–12.0)
Neutro Abs: 6.7 10*3/uL (ref 1.4–7.7)
Neutrophils Relative %: 78 % — ABNORMAL HIGH (ref 43.0–77.0)
Platelets: 183 10*3/uL (ref 150.0–400.0)
RBC: 3.97 Mil/uL (ref 3.87–5.11)
RDW: 14.5 % (ref 11.5–15.5)
WBC: 8.6 10*3/uL (ref 4.0–10.5)

## 2015-04-15 LAB — HEPATIC FUNCTION PANEL
ALBUMIN: 3.7 g/dL (ref 3.5–5.2)
ALT: 13 U/L (ref 0–35)
AST: 18 U/L (ref 0–37)
Alkaline Phosphatase: 55 U/L (ref 39–117)
Bilirubin, Direct: 0.1 mg/dL (ref 0.0–0.3)
TOTAL PROTEIN: 6.4 g/dL (ref 6.0–8.3)
Total Bilirubin: 0.7 mg/dL (ref 0.2–1.2)

## 2015-04-15 LAB — LIPID PANEL
CHOLESTEROL: 152 mg/dL (ref 0–200)
HDL: 53.5 mg/dL (ref >39.00)
LDL Cholesterol: 76 mg/dL (ref 0–99)
NONHDL: 98.93
Total CHOL/HDL Ratio: 3
Triglycerides: 116 mg/dL (ref 0.0–149.0)
VLDL: 23.2 mg/dL (ref 0.0–40.0)

## 2015-04-15 LAB — HEMOGLOBIN A1C: Hgb A1c MFr Bld: 6.1 % (ref 4.6–6.5)

## 2015-04-15 LAB — TSH: TSH: 1.06 u[IU]/mL (ref 0.35–4.50)

## 2015-04-16 ENCOUNTER — Other Ambulatory Visit: Payer: Self-pay | Admitting: Internal Medicine

## 2015-04-16 NOTE — Telephone Encounter (Signed)
Sent to the pharmacy for #90.  Pt has upcoming scheduled cpx on 04/22/15

## 2015-04-22 ENCOUNTER — Encounter: Payer: Self-pay | Admitting: Internal Medicine

## 2015-04-22 ENCOUNTER — Ambulatory Visit (INDEPENDENT_AMBULATORY_CARE_PROVIDER_SITE_OTHER): Payer: PPO | Admitting: Internal Medicine

## 2015-04-22 VITALS — BP 120/74 | Temp 98.0°F | Ht 61.0 in | Wt 318.0 lb

## 2015-04-22 DIAGNOSIS — Z0000 Encounter for general adult medical examination without abnormal findings: Secondary | ICD-10-CM

## 2015-04-22 DIAGNOSIS — D519 Vitamin B12 deficiency anemia, unspecified: Secondary | ICD-10-CM | POA: Diagnosis not present

## 2015-04-22 DIAGNOSIS — T148 Other injury of unspecified body region: Secondary | ICD-10-CM

## 2015-04-22 DIAGNOSIS — R7301 Impaired fasting glucose: Secondary | ICD-10-CM

## 2015-04-22 DIAGNOSIS — R7303 Prediabetes: Secondary | ICD-10-CM

## 2015-04-22 DIAGNOSIS — R7309 Other abnormal glucose: Secondary | ICD-10-CM

## 2015-04-22 DIAGNOSIS — I10 Essential (primary) hypertension: Secondary | ICD-10-CM | POA: Diagnosis not present

## 2015-04-22 DIAGNOSIS — Z79899 Other long term (current) drug therapy: Secondary | ICD-10-CM

## 2015-04-22 DIAGNOSIS — T148XXA Other injury of unspecified body region, initial encounter: Secondary | ICD-10-CM

## 2015-04-22 DIAGNOSIS — Z23 Encounter for immunization: Secondary | ICD-10-CM | POA: Diagnosis not present

## 2015-04-22 DIAGNOSIS — Z7901 Long term (current) use of anticoagulants: Secondary | ICD-10-CM

## 2015-04-22 DIAGNOSIS — Z1211 Encounter for screening for malignant neoplasm of colon: Secondary | ICD-10-CM

## 2015-04-22 DIAGNOSIS — M2550 Pain in unspecified joint: Secondary | ICD-10-CM | POA: Insufficient documentation

## 2015-04-22 DIAGNOSIS — R233 Spontaneous ecchymoses: Secondary | ICD-10-CM

## 2015-04-22 MED ORDER — CYANOCOBALAMIN 1000 MCG/ML IJ SOLN
1000.0000 ug | Freq: Once | INTRAMUSCULAR | Status: AC
  Administered 2015-04-22: 1000 ug via INTRAMUSCULAR
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
## 2015-04-22 NOTE — Patient Instructions (Addendum)
Get back on b12 shots   Oral ok in the interim.  Stool cards  Yearly  For colon cancer screening .   If have blood in stool or  Change in bowel habits    We may need to see GI .

## 2015-04-29 ENCOUNTER — Telehealth: Payer: Self-pay | Admitting: Cardiovascular Disease

## 2015-04-29 NOTE — Telephone Encounter (Signed)
Patient notified that NO xarelto 98m samples are available. She will call back later.

## 2015-04-29 NOTE — Telephone Encounter (Signed)
Patient calling the office for samples of medication:   1.  What medication and dosage are you requesting samples for? Xarelto  2.  Are you currently out of this medication? About a month left  3. Are you requesting samples to get you through until a mail order prescription arrives?no

## 2015-05-04 ENCOUNTER — Telehealth: Payer: Self-pay | Admitting: Cardiovascular Disease

## 2015-05-04 NOTE — Telephone Encounter (Signed)
No problem with Limbrel and Xarelto

## 2015-05-04 NOTE — Telephone Encounter (Signed)
[name] is calling to see if we have any Xarelto samples . [facility] prescribe ( Limbrel )and she wants to know if it is ok to take with a blood thinner .Marland Kitchen Please call  Thanks

## 2015-05-04 NOTE — Telephone Encounter (Signed)
Patient notified no xarelto 33m samples are available. She will check back later.   She states Dr. RNelva Bush Rx'ed Limbrel and told her this was safe to take with xarelto and she wants to confirm this.   Will route to KNew [place]ostonto advise

## 2015-05-04 NOTE — Telephone Encounter (Signed)
Patient called and notified of [name]'s advice.

## 2015-05-15 ENCOUNTER — Other Ambulatory Visit: Payer: Self-pay | Admitting: Internal Medicine

## 2015-05-15 NOTE — Telephone Encounter (Signed)
Sent to the pharmacy by e-scribe. 

## 2015-05-17 ENCOUNTER — Other Ambulatory Visit: Payer: Self-pay | Admitting: Internal Medicine

## 2015-05-19 NOTE — Telephone Encounter (Signed)
Called to the pharmacy and left on machine. 

## 2015-05-19 NOTE — Telephone Encounter (Signed)
Call in #[phone] with no rf

## 2015-05-22 ENCOUNTER — Encounter: Payer: Self-pay | Admitting: Internal Medicine

## 2015-05-22 ENCOUNTER — Ambulatory Visit (INDEPENDENT_AMBULATORY_CARE_PROVIDER_SITE_OTHER): Payer: PPO | Admitting: Internal Medicine

## 2015-05-22 ENCOUNTER — Other Ambulatory Visit: Payer: PPO

## 2015-05-22 VITALS — BP 85/55 | HR 100 | Temp 99.0°F | Resp 20

## 2015-05-22 DIAGNOSIS — R5081 Fever presenting with conditions classified elsewhere: Secondary | ICD-10-CM

## 2015-05-22 DIAGNOSIS — I482 Chronic atrial fibrillation, unspecified: Secondary | ICD-10-CM

## 2015-05-22 DIAGNOSIS — I10 Essential (primary) hypertension: Secondary | ICD-10-CM

## 2015-05-22 DIAGNOSIS — E538 Deficiency of other specified B group vitamins: Secondary | ICD-10-CM | POA: Diagnosis not present

## 2015-05-22 DIAGNOSIS — Z8673 Personal history of transient ischemic attack (TIA), and cerebral infarction without residual deficits: Secondary | ICD-10-CM | POA: Diagnosis not present

## 2015-05-22 LAB — CBC WITH DIFFERENTIAL/PLATELET
BASOS ABS: 0 10*3/uL (ref 0.0–0.1)
BASOS PCT: 0 % (ref 0.0–3.0)
EOS ABS: 0.2 10*3/uL (ref 0.0–0.7)
Eosinophils Relative: 2.9 % (ref 0.0–5.0)
HEMATOCRIT: 40.7 % (ref 36.0–46.0)
Hemoglobin: 13.5 g/dL (ref 12.0–15.0)
LYMPHS PCT: 2.5 % — AB (ref 12.0–46.0)
Lymphs Abs: 0.2 10*3/uL — ABNORMAL LOW (ref 0.7–4.0)
MCHC: 33.2 g/dL (ref 30.0–36.0)
MCV: 101.6 fl — ABNORMAL HIGH (ref 78.0–100.0)
MONO ABS: 0.5 10*3/uL (ref 0.1–1.0)
Monocytes Relative: 5.4 % (ref 3.0–12.0)
NEUTROS ABS: 7.6 10*3/uL (ref 1.4–7.7)
PLATELETS: 138 10*3/uL — AB (ref 150.0–400.0)
RBC: 4 Mil/uL (ref 3.87–5.11)
RDW: 15 % (ref 11.5–15.5)
WBC: 8.5 10*3/uL (ref 4.0–10.5)

## 2015-05-22 LAB — BASIC METABOLIC PANEL
BUN: 37 mg/dL — AB (ref 6–23)
CHLORIDE: 101 meq/L (ref 96–112)
CO2: 30 mEq/L (ref 19–32)
Calcium: 9 mg/dL (ref 8.4–10.5)
Creatinine, Ser: 1.18 mg/dL (ref 0.40–1.20)
GFR: 48.74 mL/min — AB (ref >60.00)
Glucose, Bld: 122 mg/dL — ABNORMAL HIGH (ref 70–99)
POTASSIUM: 4.7 meq/L (ref 3.5–5.1)
SODIUM: 138 meq/L (ref 135–145)

## 2015-05-22 NOTE — Progress Notes (Signed)
Pre visit review using our clinic review tool, if applicable. No additional management support is needed unless otherwise documented below in the visit note.
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
## 2015-05-22 NOTE — Patient Instructions (Signed)
Drink as much fluid as you  can tolerate over the next few days  Return slides to check stool for hidden blood  Discontinue lisinopril hydrochlorothiazide  Decrease atenolol to 25 mg twice daily  Return in 3 days for follow-up  Report to the emergency department immediately if any clinical deterioration

## 2015-05-25 ENCOUNTER — Encounter: Payer: Self-pay | Admitting: Internal Medicine

## 2015-05-25 ENCOUNTER — Ambulatory Visit
Admission: RE | Admit: 2015-05-25 | Discharge: 2015-05-25 | Disposition: A | Discharge status: Home / Self Care | Payer: PPO | Source: Ambulatory Visit | Attending: Internal Medicine | Admitting: Internal Medicine

## 2015-05-25 ENCOUNTER — Ambulatory Visit: Payer: PPO | Admitting: Nurse Practitioner

## 2015-05-25 ENCOUNTER — Ambulatory Visit (INDEPENDENT_AMBULATORY_CARE_PROVIDER_SITE_OTHER): Payer: PPO | Admitting: Internal Medicine

## 2015-05-25 VITALS — BP 94/60 | HR 99 | Temp 98.5°F | Ht 61.0 in

## 2015-05-25 DIAGNOSIS — Z8673 Personal history of transient ischemic attack (TIA), and cerebral infarction without residual deficits: Secondary | ICD-10-CM

## 2015-05-25 DIAGNOSIS — R0602 Shortness of breath: Secondary | ICD-10-CM

## 2015-05-25 DIAGNOSIS — I482 Chronic atrial fibrillation, unspecified: Secondary | ICD-10-CM

## 2015-05-25 DIAGNOSIS — D519 Vitamin B12 deficiency anemia, unspecified: Secondary | ICD-10-CM

## 2015-05-25 DIAGNOSIS — E538 Deficiency of other specified B group vitamins: Secondary | ICD-10-CM

## 2015-05-25 DIAGNOSIS — Z7901 Long term (current) use of anticoagulants: Secondary | ICD-10-CM

## 2015-05-25 DIAGNOSIS — R197 Diarrhea, unspecified: Secondary | ICD-10-CM

## 2015-05-25 DIAGNOSIS — Z23 Encounter for immunization: Secondary | ICD-10-CM

## 2015-05-25 DIAGNOSIS — R531 Weakness: Secondary | ICD-10-CM | POA: Diagnosis not present

## 2015-05-25 LAB — PATHOLOGIST SMEAR REVIEW

## 2015-05-25 MED ORDER — CYANOCOBALAMIN 1000 MCG/ML IJ SOLN
1000.0000 ug | Freq: Once | INTRAMUSCULAR | Status: AC
  Administered 2015-05-25: 1000 ug via INTRAMUSCULAR
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
## 2015-05-25 NOTE — Patient Instructions (Addendum)
Still thinking  This is dehydration and acute illness .   Labs are reassuring.   Stop    The statin med for now simvastatin  Incase adding to the leg  Discomfort weak feeling.  Get chest x ray to ensure no pneumonia or lung process.   Stay on low dose  Atenolol . For now   Minimize dose of the tramadol .   Call us with bp readings pulses  on Wednesday  If pulses high over 120 130   Or   150 and over  Contact evmergent services  Ed etc  Stay off of the   Lisinopril  hctz  at this time.   Plan ROV in 1-2 weeks  Unless    Not needed.

## 2015-05-29 ENCOUNTER — Encounter: Payer: Self-pay | Admitting: Internal Medicine

## 2015-06-01 ENCOUNTER — Ambulatory Visit: Payer: PPO | Admitting: Family Medicine

## 2015-06-01 ENCOUNTER — Encounter: Payer: Self-pay | Admitting: Internal Medicine

## 2015-06-02 ENCOUNTER — Encounter: Payer: Self-pay | Admitting: Internal Medicine

## 2015-06-02 ENCOUNTER — Ambulatory Visit (INDEPENDENT_AMBULATORY_CARE_PROVIDER_SITE_OTHER): Payer: PPO | Admitting: Internal Medicine

## 2015-06-02 VITALS — BP 122/78 | HR 90 | Temp 98.1°F | Resp 12 | Ht 61.0 in

## 2015-06-02 DIAGNOSIS — I10 Essential (primary) hypertension: Secondary | ICD-10-CM

## 2015-06-02 DIAGNOSIS — D696 Thrombocytopenia, unspecified: Secondary | ICD-10-CM

## 2015-06-02 DIAGNOSIS — R29898 Other symptoms and signs involving the musculoskeletal system: Secondary | ICD-10-CM

## 2015-06-02 DIAGNOSIS — Z8639 Personal history of other endocrine, nutritional and metabolic disease: Secondary | ICD-10-CM | POA: Diagnosis not present

## 2015-06-02 DIAGNOSIS — I482 Chronic atrial fibrillation, unspecified: Secondary | ICD-10-CM

## 2015-06-02 DIAGNOSIS — Z7901 Long term (current) use of anticoagulants: Secondary | ICD-10-CM

## 2015-06-02 DIAGNOSIS — M79672 Pain in left foot: Secondary | ICD-10-CM | POA: Diagnosis not present

## 2015-06-02 DIAGNOSIS — Z8739 Personal history of other diseases of the musculoskeletal system and connective tissue: Secondary | ICD-10-CM

## 2015-06-02 LAB — CBC WITH DIFFERENTIAL/PLATELET
Basophils Absolute: 0 10*3/uL (ref 0.0–0.1)
Basophils Relative: 0.4 % (ref 0.0–3.0)
EOS PCT: 3.3 % (ref 0.0–5.0)
Eosinophils Absolute: 0.3 10*3/uL (ref 0.0–0.7)
HEMATOCRIT: 37.1 % (ref 36.0–46.0)
HEMOGLOBIN: 12 g/dL (ref 12.0–15.0)
LYMPHS PCT: 8.7 % — AB (ref 12.0–46.0)
Lymphs Abs: 0.9 10*3/uL (ref 0.7–4.0)
MCHC: 32.3 g/dL (ref 30.0–36.0)
MCV: 102.9 fl — AB (ref 78.0–100.0)
MONOS PCT: 7.2 % (ref 3.0–12.0)
Monocytes Absolute: 0.7 10*3/uL (ref 0.1–1.0)
Neutro Abs: 8.1 10*3/uL — ABNORMAL HIGH (ref 1.4–7.7)
Neutrophils Relative %: 80.4 % — ABNORMAL HIGH (ref 43.0–77.0)
Platelets: 267 10*3/uL (ref 150.0–400.0)
RBC: 3.6 Mil/uL — AB (ref 3.87–5.11)
RDW: 16.1 % — ABNORMAL HIGH (ref 11.5–15.5)
WBC: 10.1 10*3/uL (ref 4.0–10.5)

## 2015-06-02 LAB — SEDIMENTATION RATE: Sed Rate: 28 mm/hr — ABNORMAL HIGH (ref 0–22)

## 2015-06-02 LAB — CK: Total CK: 18 U/L (ref 7–177)

## 2015-06-02 LAB — URIC ACID: URIC ACID, SERUM: 5 mg/dL (ref 2.4–7.0)

## 2015-06-02 MED ORDER — LISINOPRIL 20 MG PO TABS: ORAL_TABLET | ORAL | Status: DC

## 2015-06-02 MED ORDER — PREDNISONE 10 MG PO TABS: ORAL_TABLET | ORAL | Status: DC
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
## 2015-06-02 NOTE — Patient Instructions (Addendum)
Plan  Repeat cbc platelets   Esr   And muscle enzyme test.     Consideration of other causes. You exam  Is reassuring.  Can go back on plain .  lisinopril    20 mg  10 mg per day   And increase to 20 mg per day .  Depending on lab tests we ,may be able to add  A course of prednisone.  For the foot.  

## 2015-06-02 NOTE — Addendum Note (Signed)
Addended byShanon Ace K on: 06/02/2015 05:55 PM   Modules accepted: Orders

## 2015-06-08 ENCOUNTER — Encounter: Payer: Self-pay | Admitting: Internal Medicine

## 2015-06-08 ENCOUNTER — Telehealth: Payer: Self-pay | Admitting: Cardiovascular Disease

## 2015-06-08 ENCOUNTER — Ambulatory Visit (INDEPENDENT_AMBULATORY_CARE_PROVIDER_SITE_OTHER): Payer: PPO | Admitting: Internal Medicine

## 2015-06-08 VITALS — BP 138/80 | HR 79 | Temp 98.5°F | Ht 61.0 in

## 2015-06-08 DIAGNOSIS — R609 Edema, unspecified: Secondary | ICD-10-CM

## 2015-06-08 DIAGNOSIS — Z7901 Long term (current) use of anticoagulants: Secondary | ICD-10-CM

## 2015-06-08 DIAGNOSIS — I10 Essential (primary) hypertension: Secondary | ICD-10-CM

## 2015-06-08 DIAGNOSIS — R29898 Other symptoms and signs involving the musculoskeletal system: Secondary | ICD-10-CM

## 2015-06-08 DIAGNOSIS — M79672 Pain in left foot: Secondary | ICD-10-CM

## 2015-06-08 DIAGNOSIS — Z8673 Personal history of transient ischemic attack (TIA), and cerebral infarction without residual deficits: Secondary | ICD-10-CM | POA: Diagnosis not present

## 2015-06-08 DIAGNOSIS — I482 Chronic atrial fibrillation, unspecified: Secondary | ICD-10-CM
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
## 2015-06-08 NOTE — Telephone Encounter (Signed)
Spoke with pt, she was seen by dr Regis Bill today and was started on lasix. Dr Regis Bill told her to follow up with dr berry. Pt would like to be seen this week due to transportation issues. She will see the PA at [address] Wednesday.

## 2015-06-08 NOTE — Telephone Encounter (Signed)
[name] is calling because she is having severe swelling in her feet , which has going on for about a month and have one spot that is weeping severely . Please call   Thanks

## 2015-06-08 NOTE — Telephone Encounter (Signed)
Want you to see cardiology ( asap) this week  MIstty see if can get her in.  We can see you in clinic if needed  What is you bp  'we may add diuretic but done want to  Get  Your BP too low.
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
## 2015-06-08 NOTE — Patient Instructions (Signed)
Take lasix   20 mg per day for the next 7 days .  Then  BMP.  ( lab )   We then need to get you appt with cardiology . also .   If  Your bp is still high  135 and above can increase atenolol   To 1/2 am and 1/2 pm .  Avoid high sodium foods and added salt at this time.

## 2015-06-10 ENCOUNTER — Ambulatory Visit (INDEPENDENT_AMBULATORY_CARE_PROVIDER_SITE_OTHER): Payer: PPO | Admitting: Physician Assistant

## 2015-06-10 ENCOUNTER — Encounter: Payer: Self-pay | Admitting: Physician Assistant

## 2015-06-10 ENCOUNTER — Other Ambulatory Visit: Payer: Self-pay | Admitting: Internal Medicine

## 2015-06-10 DIAGNOSIS — Z7901 Long term (current) use of anticoagulants: Secondary | ICD-10-CM | POA: Diagnosis not present

## 2015-06-10 DIAGNOSIS — R7989 Other specified abnormal findings of blood chemistry: Secondary | ICD-10-CM

## 2015-06-10 DIAGNOSIS — R600 Localized edema: Secondary | ICD-10-CM | POA: Diagnosis not present

## 2015-06-10 DIAGNOSIS — I482 Chronic atrial fibrillation, unspecified: Secondary | ICD-10-CM

## 2015-06-10 DIAGNOSIS — I10 Essential (primary) hypertension: Secondary | ICD-10-CM

## 2015-06-10 DIAGNOSIS — R945 Abnormal results of liver function studies: Secondary | ICD-10-CM

## 2015-06-10 DIAGNOSIS — R0600 Dyspnea, unspecified: Secondary | ICD-10-CM

## 2015-06-10 LAB — BASIC METABOLIC PANEL
BUN: 19 mg/dL (ref 7–25)
CALCIUM: 8.6 mg/dL (ref 8.6–10.4)
CHLORIDE: 103 mmol/L (ref 98–110)
CO2: 31 mmol/L (ref 20–31)
CREATININE: 0.84 mg/dL (ref 0.50–0.99)
Glucose, Bld: 108 mg/dL — ABNORMAL HIGH (ref 65–99)
Potassium: 3.6 mmol/L (ref 3.5–5.3)
Sodium: 142 mmol/L (ref 135–146)

## 2015-06-10 MED ORDER — FUROSEMIDE 20 MG PO TABS: 20.0000 mg | ORAL_TABLET | Freq: Every day | ORAL | Status: DC

## 2015-06-10 MED ORDER — ATENOLOL 50 MG PO TABS: ORAL_TABLET | ORAL | Status: DC

## 2015-06-10 NOTE — Telephone Encounter (Signed)
Last OV note says added Lasix.  Do not see on active medication list.  Please advise.  Thanks!

## 2015-06-10 NOTE — Telephone Encounter (Signed)
Tell patient that i want her to  Not go back to the hctz  jsut yet  until  fter off of  the lasix or depending on what cardiology says .  Please ask her if she has enough of the lisinopril plain  And we can refill that

## 2015-06-10 NOTE — Telephone Encounter (Signed)
Spoke to the pt.  She seen cardiology this morning.  Will continue lasix and plain lisinopril until notice. Medication denied.  Instructed pt to call pharmacy and have this medication taken off automatic refill.

## 2015-06-10 NOTE — Telephone Encounter (Signed)
Closed ecnounter

## 2015-06-10 NOTE — Patient Instructions (Addendum)
Medication Instructions:  Your physician has recommended you make the following change in your medication:  1.  CONTINUE the Lasix 20 mg taking 1 tablet daily 2.  CONTINUE the Atenolol 50 mg taking 1 tablet in the morning and 1/2 tablet in the evening  Labwork: TODAY: BMET   Testing/Procedures: Your physician has requested that you have an echocardiogram. Echocardiography is a painless test that uses sound waves to create images of your heart. It provides your doctor with information about the size and shape of your heart and how well your heart's chambers and valves are working. This procedure takes approximately one hour. There are no restrictions for this procedure.     Follow-Up: Your physician wants you to follow-up in: [place].  You will receive a reminder letter in the mail two months in advance. If you don't receive a letter, please call our office to schedule the follow-up appointment.   Any Other Special Instructions Will Be Listed Below (If Applicable).  Echocardiogram An echocardiogram, or echocardiography, uses sound waves (ultrasound) to produce an image of your heart. The echocardiogram is simple, painless, obtained within a short period of time, and offers valuable information to your health care provider. The images from an echocardiogram can provide information such as:  Evidence of coronary artery disease (CAD).  Heart size.  Heart muscle function.  Heart valve function.  Aneurysm detection.  Evidence of a past heart attack.  Fluid buildup around the heart.  Heart muscle thickening.  Assess heart valve function. LET [facility] CARE PROVIDER KNOW ABOUT:  Any allergies you have.  All medicines you are taking, including vitamins, herbs, eye drops, creams, and over-the-counter medicines.  Previous problems you or members of your family have had with the use of anesthetics.  Any blood disorders you have.  Previous surgeries you have  had.  Medical conditions you have.  Possibility of pregnancy, if this applies. BEFORE THE PROCEDURE  No special preparation is needed. Eat and drink normally.  PROCEDURE   In order to produce an image of your heart, gel will be applied to your chest and a wand-like tool (transducer) will be moved over your chest. The gel will help transmit the sound waves from the transducer. The sound waves will harmlessly bounce off your heart to allow the heart images to be captured in real-time motion. These images will then be recorded.  You may need an IV to receive a medicine that improves the quality of the pictures. AFTER THE PROCEDURE You may return to your normal schedule including diet, activities, and medicines, unless your health care provider tells you otherwise.   This information is not intended to replace advice given to you by your health care provider. Make sure you discuss any questions you have with your health care provider.   Document Released: 08/19/2000 Document Revised: 09/12/2014 Document Reviewed: 04/29/2013 Elsevier Interactive Patient Education [employer].

## 2015-06-11 ENCOUNTER — Encounter: Payer: Self-pay | Admitting: Internal Medicine

## 2015-06-11 ENCOUNTER — Telehealth: Payer: Self-pay | Admitting: *Deleted

## 2015-06-11 ENCOUNTER — Other Ambulatory Visit: Payer: Self-pay | Admitting: Family Medicine

## 2015-06-11 DIAGNOSIS — R300 Dysuria: Secondary | ICD-10-CM

## 2015-06-11 DIAGNOSIS — R3915 Urgency of urination: Secondary | ICD-10-CM

## 2015-06-11 NOTE — Telephone Encounter (Signed)
-----  Message from Eileen Stanford, PA-C sent at 06/10/2015  7:18 PM EDT ----- Could you let her know her labs looked good. We can increase her lasix from 76m qd to 434mqd x4 days to see if this helps her swelling.  She has a 7 day supply of 20 mg already (should be on day 4 of 7 on 06/11/15), she may need  Some more pills called in with the increased dose. Thanks!

## 2015-06-12 ENCOUNTER — Other Ambulatory Visit (INDEPENDENT_AMBULATORY_CARE_PROVIDER_SITE_OTHER): Payer: PPO

## 2015-06-12 ENCOUNTER — Other Ambulatory Visit: Payer: Self-pay | Admitting: Internal Medicine

## 2015-06-12 DIAGNOSIS — R300 Dysuria: Secondary | ICD-10-CM | POA: Diagnosis not present

## 2015-06-12 DIAGNOSIS — R3915 Urgency of urination: Secondary | ICD-10-CM

## 2015-06-12 LAB — URINALYSIS, ROUTINE W REFLEX MICROSCOPIC
BILIRUBIN URINE: NEGATIVE
Hgb urine dipstick: NEGATIVE
Ketones, ur: NEGATIVE
Nitrite: NEGATIVE
PH: 5 (ref 5.0–8.0)
SPECIFIC GRAVITY, URINE: 1.02 (ref 1.000–1.030)
Total Protein, Urine: NEGATIVE
UROBILINOGEN UA: 0.2 (ref 0.0–1.0)
Urine Glucose: NEGATIVE

## 2015-06-12 MED ORDER — SULFAMETHOXAZOLE-TRIMETHOPRIM 800-160 MG PO TABS: 1.0000 | ORAL_TABLET | Freq: Two times a day (BID) | ORAL | Status: DC

## 2015-06-15 LAB — URINE CULTURE: Colony Count: >=100000

## 2015-06-17 ENCOUNTER — Ambulatory Visit (HOSPITAL_COMMUNITY): Payer: PPO | Attending: Cardiovascular Disease

## 2015-06-17 ENCOUNTER — Other Ambulatory Visit: Payer: Self-pay

## 2015-06-17 ENCOUNTER — Other Ambulatory Visit: Payer: Self-pay | Admitting: Physician Assistant

## 2015-06-17 ENCOUNTER — Encounter: Payer: Self-pay | Admitting: Internal Medicine

## 2015-06-17 DIAGNOSIS — I517 Cardiomegaly: Secondary | ICD-10-CM | POA: Insufficient documentation

## 2015-06-17 DIAGNOSIS — I4891 Unspecified atrial fibrillation: Secondary | ICD-10-CM | POA: Diagnosis not present

## 2015-06-17 DIAGNOSIS — I350 Nonrheumatic aortic (valve) stenosis: Secondary | ICD-10-CM | POA: Insufficient documentation

## 2015-06-17 DIAGNOSIS — I071 Rheumatic tricuspid insufficiency: Secondary | ICD-10-CM | POA: Insufficient documentation

## 2015-06-17 DIAGNOSIS — I482 Chronic atrial fibrillation, unspecified: Secondary | ICD-10-CM

## 2015-06-17 DIAGNOSIS — R7989 Other specified abnormal findings of blood chemistry: Secondary | ICD-10-CM

## 2015-06-17 DIAGNOSIS — R600 Localized edema: Secondary | ICD-10-CM

## 2015-06-17 DIAGNOSIS — I340 Nonrheumatic mitral (valve) insufficiency: Secondary | ICD-10-CM | POA: Insufficient documentation

## 2015-06-17 DIAGNOSIS — I10 Essential (primary) hypertension: Secondary | ICD-10-CM

## 2015-06-17 DIAGNOSIS — R945 Abnormal results of liver function studies: Secondary | ICD-10-CM

## 2015-06-17 DIAGNOSIS — Z7901 Long term (current) use of anticoagulants: Secondary | ICD-10-CM | POA: Diagnosis not present

## 2015-06-17 DIAGNOSIS — R0600 Dyspnea, unspecified: Secondary | ICD-10-CM

## 2015-06-17 DIAGNOSIS — I059 Rheumatic mitral valve disease, unspecified: Secondary | ICD-10-CM | POA: Insufficient documentation

## 2015-06-17 MED ORDER — POTASSIUM CHLORIDE ER 20 MEQ PO TBCR: 20.0000 meq | EXTENDED_RELEASE_TABLET | Freq: Every day | ORAL | Status: DC

## 2015-06-18 NOTE — Telephone Encounter (Signed)
Misty    I am ok with this but we need dx code and perhaps put cards name   Just change the location?  WP

## 2015-06-19 ENCOUNTER — Ambulatory Visit: Payer: PPO | Admitting: Nurse Practitioner

## 2015-06-19 ENCOUNTER — Telehealth: Payer: Self-pay | Admitting: *Deleted

## 2015-06-19 ENCOUNTER — Telehealth: Payer: Self-pay | Admitting: Internal Medicine

## 2015-06-19 ENCOUNTER — Other Ambulatory Visit (INDEPENDENT_AMBULATORY_CARE_PROVIDER_SITE_OTHER): Payer: PPO | Admitting: *Deleted

## 2015-06-19 ENCOUNTER — Encounter: Payer: Self-pay | Admitting: Internal Medicine

## 2015-06-19 DIAGNOSIS — I10 Essential (primary) hypertension: Secondary | ICD-10-CM | POA: Diagnosis not present

## 2015-06-19 LAB — BASIC METABOLIC PANEL
BUN: 27 mg/dL — AB (ref 7–25)
CO2: 25 mmol/L (ref 20–31)
Calcium: 8.9 mg/dL (ref 8.6–10.4)
Chloride: 99 mmol/L (ref 98–110)
Creat: 1.5 mg/dL — ABNORMAL HIGH (ref 0.50–0.99)
Glucose, Bld: 85 mg/dL (ref 65–99)
POTASSIUM: 3.8 mmol/L (ref 3.5–5.3)
Sodium: 138 mmol/L (ref 135–146)

## 2015-06-19 NOTE — Addendum Note (Signed)
Addended by: Eulis Foster on: 06/19/2015 11:14 AM   Modules accepted: Orders

## 2015-06-19 NOTE — Telephone Encounter (Signed)
-----  Message from Eileen Stanford, PA-C sent at 06/18/2015  4:45 PM EDT ----- Can you make sure she knows we want her to get the BMET either today or tomorrow? Probably will have to be tomorrow. I want to make sure her potassium doesn't get too low. Also make sure she picked up the Vevay. Thanks!

## 2015-06-19 NOTE — Telephone Encounter (Signed)
I answered this ? Via forwared message my chart from yesterday  Please refer to this

## 2015-06-19 NOTE — Telephone Encounter (Signed)
Pt heart doctor put orders in for a BMP and pt is asking if she can come here and have them labs drawn. Pt is having problems with her legs and walking is a problem for her.

## 2015-06-19 NOTE — Telephone Encounter (Signed)
I called pt this morning to make sure she was aware that we wanted her BMET rechecked today.  Pt was concerned that she hadn't heard anything from her PCP to see if they could do it or not and she said that she felt unsafe to walk in our building and come up to the 3rd floor due to her health status at this moment.  I offered for the pt to drive here and call up and let me know when she was here and we would make sure that she got up to our office and got her blood work done.  Pt verbalized appreciation to me for this suggestion and she was advised to come when it was convenient for her and we would do what we need to do to get her up here to our lab.  Pt stated that she would make her way within the hr.

## 2015-06-22 ENCOUNTER — Telehealth: Payer: Self-pay | Admitting: Physician Assistant

## 2015-06-22 ENCOUNTER — Telehealth: Payer: Self-pay | Admitting: *Deleted

## 2015-06-22 ENCOUNTER — Other Ambulatory Visit: Payer: Self-pay | Admitting: *Deleted

## 2015-06-22 DIAGNOSIS — I482 Chronic atrial fibrillation, unspecified: Secondary | ICD-10-CM

## 2015-06-22 DIAGNOSIS — R600 Localized edema: Secondary | ICD-10-CM

## 2015-06-22 DIAGNOSIS — I10 Essential (primary) hypertension: Secondary | ICD-10-CM

## 2015-06-22 NOTE — Telephone Encounter (Signed)
    Creat noted to be elevated to 1.5 Told to stop lasix over the weekend and get a BMET on Monday. Will make sure this is arranged.   Angelena Form PA-C  [facility]

## 2015-06-22 NOTE — Telephone Encounter (Signed)
Per Nell Range, PA-C, called the pt to let her know that we needed her to make sure she is not taking her Lasix medication and that we needed her to come in either today or tomorrow to have her BMET rechecked.  Pt made me aware that she has not taken any since Friday afternoon and she will be in tomorrow, 06-23-15, to have her labs redrawn.

## 2015-06-23 ENCOUNTER — Other Ambulatory Visit (INDEPENDENT_AMBULATORY_CARE_PROVIDER_SITE_OTHER): Payer: PPO

## 2015-06-23 ENCOUNTER — Other Ambulatory Visit: Payer: Self-pay | Admitting: Physician Assistant

## 2015-06-23 DIAGNOSIS — R600 Localized edema: Secondary | ICD-10-CM

## 2015-06-23 DIAGNOSIS — I10 Essential (primary) hypertension: Secondary | ICD-10-CM

## 2015-06-23 DIAGNOSIS — I482 Chronic atrial fibrillation, unspecified: Secondary | ICD-10-CM

## 2015-06-23 LAB — BASIC METABOLIC PANEL
BUN: 25 mg/dL (ref 7–25)
CALCIUM: 9.5 mg/dL (ref 8.6–10.4)
CO2: 26 mmol/L (ref 20–31)
Chloride: 102 mmol/L (ref 98–110)
Creat: 1.09 mg/dL — ABNORMAL HIGH (ref 0.50–0.99)
GLUCOSE: 97 mg/dL (ref 65–99)
Potassium: 4.6 mmol/L (ref 3.5–5.3)
SODIUM: 139 mmol/L (ref 135–146)

## 2015-06-23 MED ORDER — POTASSIUM CHLORIDE ER 10 MEQ PO TBCR: 10.0000 meq | EXTENDED_RELEASE_TABLET | Freq: Every day | ORAL | Status: DC

## 2015-06-23 MED ORDER — FUROSEMIDE 20 MG PO TABS: 20.0000 mg | ORAL_TABLET | Freq: Every day | ORAL | Status: DC

## 2015-06-24 ENCOUNTER — Telehealth: Payer: Self-pay | Admitting: *Deleted

## 2015-06-24 NOTE — Telephone Encounter (Signed)
Called and spoke with the pt to let her know that her Kidney function had improved from her labs that she had completed yesterday.  She has been advised that she is to restart Lasix 20 mg taking 1 daily and Potassium 10 meq taking 1 daily.  Pt verbalized understanding.

## 2015-06-24 NOTE — Telephone Encounter (Signed)
-----  Message from Eileen Stanford, PA-C sent at 06/23/2015 10:21 PM EDT ----- Kidney function improved. Let's start her on 70m Lasix qd and 10 mEq kdur for maintenance dosing. i will place orders in her pharmacy tonight. Will you let her know? Thanks!

## 2015-06-25 ENCOUNTER — Encounter: Payer: Self-pay | Admitting: Internal Medicine

## 2015-06-25 ENCOUNTER — Other Ambulatory Visit: Payer: Self-pay | Admitting: Obstetrics & Gynecology

## 2015-06-25 ENCOUNTER — Other Ambulatory Visit: Payer: Self-pay | Admitting: Internal Medicine

## 2015-06-25 NOTE — Telephone Encounter (Signed)
Level has not been checked since 2013.  RF is not appropriate without updated value.  She can have a Vit D level repeat with her next labs (wherever she has them done) and let us know.  Then I can do the RF.  Thanks.  RX declined.

## 2015-06-25 NOTE — Telephone Encounter (Signed)
I spoke with patient in regards to her refill request. She said she is not feeling well right now and can't make it in for an appointment. She is requesting we refill it one more time.   Medication refill request: vitamin D 50,000 Last AEX:  06/2013 Next AEX: not scheduled  Last MMG (if hormonal medication request): 12-16-14 WNL  Refill authorized: please advise

## 2015-06-26 ENCOUNTER — Telehealth: Payer: Self-pay

## 2015-06-26 DIAGNOSIS — R600 Localized edema: Secondary | ICD-10-CM

## 2015-06-26 NOTE — Telephone Encounter (Signed)
Looks like  lab  Is seet up for bmp for next week  , keep this lab appt.  And see if new dose of med helps her .  See how you do    With this .    I f not getting better  And  still a problem earlier check might be in order .

## 2015-06-26 NOTE — Telephone Encounter (Signed)
Sent to the pharmacy by e-scribe. 

## 2015-06-26 NOTE — Telephone Encounter (Signed)
Spoke with pt she has had a visit with PA on 10/5 where pt was started on 64m of Lasix BID and K 10MeQ Qdaily.  Pt BMET checked on 10/14 with increase SCr. And repeat on 10/18.  Pt Lasix was decreased to 20 mg DAILY with the 10 MeQ of Potassium.  Pt just stared taking this new dosage on 10/20 and has not noticed much difference with feet and ankle swelling since starting new dose but knows that it is still new.  Pt stated she did notice some difference with swelling when taking 20 mg Lasix BID but knows it was affecting her kidneys.  Pt has been mindful of salt and processed foods, she did admit to have a few potato chips yesterday. Pt stated she has not had a lot of fluids either.  Pt has no c/o of SOB, Chest Pain or pressure. Overall pt states she feels well just has this swelling.  Pt does not have f/u scheduled at this time and it very worried about her kidney function and swelling and not being evaluated. [facility] swelling is not better in a week.  Pt not expected to see Dr. BGwenlyn Founduntil 11/2015

## 2015-06-28 ENCOUNTER — Encounter: Payer: Self-pay | Admitting: Cardiovascular Disease

## 2015-06-29 ENCOUNTER — Telehealth: Payer: Self-pay | Admitting: Cardiovascular Disease

## 2015-06-29 ENCOUNTER — Ambulatory Visit (INDEPENDENT_AMBULATORY_CARE_PROVIDER_SITE_OTHER): Payer: PPO | Admitting: Internal Medicine

## 2015-06-29 ENCOUNTER — Encounter: Payer: Self-pay | Admitting: Internal Medicine

## 2015-06-29 VITALS — BP 90/60 | HR 102 | Temp 97.8°F

## 2015-06-29 DIAGNOSIS — R609 Edema, unspecified: Secondary | ICD-10-CM | POA: Diagnosis not present

## 2015-06-29 DIAGNOSIS — M79671 Pain in right foot: Secondary | ICD-10-CM | POA: Diagnosis not present

## 2015-06-29 DIAGNOSIS — L03115 Cellulitis of right lower limb: Secondary | ICD-10-CM

## 2015-06-29 MED ORDER — AMOXICILLIN-POT CLAVULANATE 875-125 MG PO TABS: 1.0000 | ORAL_TABLET | Freq: Two times a day (BID) | ORAL | Status: DC

## 2015-06-29 NOTE — Telephone Encounter (Signed)
Pt called in stating that she spoke with Dr. Kennon Holter nurse on 10/21 and she spoke with her about her swollen legs. Today she would like to report that her left leg is fine but her rt is still swollen and red and she would like to come in and be seen. Please f/u with her  Thanks
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
## 2015-06-29 NOTE — Telephone Encounter (Signed)
06-29-15 Patient aware that she needs an appointment before any additional refills. She said she will call back next week and make an appointment for her AEX -eh

## 2015-06-29 NOTE — Telephone Encounter (Signed)
Returned call to patient. She reports swelling of bilateral LE for about the past month. Her left leg swelling has decreased but she still has an area of weeping. Her right leg is very swollen, red, tender per patient. She denies chest pain/pressure. She reports shortness of breath but this is NOT new or worse - is with activity. She is taking lasix 54m QD and K+ 158m. [facility] and she states she will be going there around 0800-0900. She would like to be evaluated in the office as this has been a problem for her for >1 month. She can only make an AM appointment due to ride situation.    [facility] was able to get patient added on to [facility] Scheduled for 10/26 and 10am - this is the soonest AM appt available.   Called patient to inform her of this and she was tearful. She states she is very concerned about her leg. Apologized that this is the soonest AM appt available given her time constraints. She then states she may not be able to make this appt as her sister will have to bring her and she has to go to work at [employer] to seek urgent care, PCP, or ED eval. Unable to advise increase in diuretics given renal function.   Patient will call back if she needs to reschedule her Flex add on appointment.

## 2015-06-29 NOTE — Patient Instructions (Addendum)
Treating for infection cellulitis .  Localized swelling  .   From hot and red need to treat for infection. Marland Kitchen  elevaetd as possible  Get x ray of foot . If not improving in 48 hours - 72 hours  May need to see  Ortho.  To ED if  Progressing redness and swelling.    Cellulitis Cellulitis is an infection of the skin and the tissue beneath it. The infected area is usually red and tender. Cellulitis occurs most often in the arms and lower legs.  CAUSES  Cellulitis is caused by bacteria that enter the skin through cracks or cuts in the skin. The most common types of bacteria that cause cellulitis are staphylococci and streptococci. SIGNS AND SYMPTOMS   Redness and warmth.  Swelling.  Tenderness or pain.  Fever. DIAGNOSIS  Your health care provider can usually determine what is wrong based on a physical exam. Blood tests may also be done. TREATMENT  Treatment usually involves taking an antibiotic medicine. HOME CARE INSTRUCTIONS   Take your antibiotic medicine as directed by your health care provider. Finish the antibiotic even if you start to feel better.  Keep the infected arm or leg elevated to reduce swelling.  Apply a warm cloth to the affected area up to 4 times per day to relieve pain.  Take medicines only as directed by your health care provider.  Keep all follow-up visits as directed by your health care provider. SEEK MEDICAL CARE IF:   You notice red streaks coming from the infected area.  Your red area gets larger or turns dark in color.  Your bone or joint underneath the infected area becomes painful after the skin has healed.  Your infection returns in the same area or another area.  You notice a swollen bump in the infected area.  You develop new symptoms.  You have a fever. SEEK IMMEDIATE MEDICAL CARE IF:   You feel very sleepy.  You develop vomiting or diarrhea.  You have a general ill feeling (malaise) with muscle aches and pains.   This  information is not intended to replace advice given to you by your health care provider. Make sure you discuss any questions you have with your health care provider.   Document Released: 06/01/2005 Document Revised: 05/13/2015 Document Reviewed: 11/07/2011 Elsevier Interactive Patient Education [employer].

## 2015-06-29 NOTE — Telephone Encounter (Signed)
  That is fine with me.

## 2015-06-30 ENCOUNTER — Ambulatory Visit
Admission: RE | Admit: 2015-06-30 | Discharge: 2015-06-30 | Disposition: A | Discharge status: Home / Self Care | Payer: PPO | Source: Ambulatory Visit | Attending: Internal Medicine | Admitting: Internal Medicine

## 2015-06-30 ENCOUNTER — Other Ambulatory Visit (INDEPENDENT_AMBULATORY_CARE_PROVIDER_SITE_OTHER): Payer: PPO

## 2015-06-30 DIAGNOSIS — M79671 Pain in right foot: Secondary | ICD-10-CM

## 2015-06-30 DIAGNOSIS — I10 Essential (primary) hypertension: Secondary | ICD-10-CM | POA: Diagnosis not present

## 2015-06-30 DIAGNOSIS — R609 Edema, unspecified: Secondary | ICD-10-CM

## 2015-06-30 DIAGNOSIS — L03115 Cellulitis of right lower limb: Secondary | ICD-10-CM

## 2015-06-30 LAB — SEDIMENTATION RATE: SED RATE: 46 mm/h — AB (ref 0–22)

## 2015-06-30 LAB — CBC WITH DIFFERENTIAL/PLATELET
BASOS ABS: 0.1 10*3/uL (ref 0.0–0.1)
Basophils Relative: 0.9 % (ref 0.0–3.0)
EOS ABS: 0.1 10*3/uL (ref 0.0–0.7)
Eosinophils Relative: 1.1 % (ref 0.0–5.0)
HCT: 39.7 % (ref 36.0–46.0)
Hemoglobin: 12.9 g/dL (ref 12.0–15.0)
LYMPHS ABS: 0.9 10*3/uL (ref 0.7–4.0)
Lymphocytes Relative: 8.5 % — ABNORMAL LOW (ref 12.0–46.0)
MCHC: 32.4 g/dL (ref 30.0–36.0)
MCV: 104.1 fl — AB (ref 78.0–100.0)
MONO ABS: 0.7 10*3/uL (ref 0.1–1.0)
MONOS PCT: 6.6 % (ref 3.0–12.0)
NEUTROS ABS: 9 10*3/uL — AB (ref 1.4–7.7)
NEUTROS PCT: 82.9 % — AB (ref 43.0–77.0)
PLATELETS: 185 10*3/uL (ref 150.0–400.0)
RBC: 3.82 Mil/uL — AB (ref 3.87–5.11)
RDW: 15.8 % — ABNORMAL HIGH (ref 11.5–15.5)
WBC: 10.8 10*3/uL — ABNORMAL HIGH (ref 4.0–10.5)

## 2015-06-30 LAB — BASIC METABOLIC PANEL
BUN: 23 mg/dL (ref 6–23)
CALCIUM: 9.4 mg/dL (ref 8.4–10.5)
CO2: 28 meq/L (ref 19–32)
CREATININE: 0.97 mg/dL (ref 0.40–1.20)
Chloride: 102 mEq/L (ref 96–112)
GFR: 61.09 mL/min (ref >60.00)
GLUCOSE: 102 mg/dL — AB (ref 70–99)
Potassium: 4.8 mEq/L (ref 3.5–5.1)
SODIUM: 140 meq/L (ref 135–145)

## 2015-06-30 NOTE — Telephone Encounter (Signed)
Called to check on her leg from call yesterday and see if she wanted to come in and see Dr. Gwenlyn Found this afternoon.  Pt stated she went to see her PCP yesterday and was diagnosed with cellulitis of the right leg and placed her on antibiotics.  Pt wants to keep scheduled appt with [facility] clinic tomorrow. Pt got blood work at [place] yesterday, results pending in [place].

## 2015-07-01 ENCOUNTER — Encounter: Payer: Self-pay | Admitting: Physician Assistant

## 2015-07-01 ENCOUNTER — Ambulatory Visit (INDEPENDENT_AMBULATORY_CARE_PROVIDER_SITE_OTHER): Payer: PPO | Admitting: Physician Assistant

## 2015-07-01 VITALS — BP 96/64 | HR 110 | Ht 62.0 in | Wt 307.0 lb

## 2015-07-01 DIAGNOSIS — R600 Localized edema: Secondary | ICD-10-CM

## 2015-07-01 MED ORDER — FUROSEMIDE 40 MG PO TABS: ORAL_TABLET | ORAL | Status: DC
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
## 2015-07-01 NOTE — Patient Instructions (Addendum)
Medication Instructions:  Your physician has recommended you make the following change in your medication:  1.  INCREASE the Lasix to 40 mg taking 1 tablet by mouth daily for 3 DAYS then go back to 1/2 tablet daily 2.  When you get to the Lasix 40 mg taking 1/2 tablet daily, then [place]  3.  STOP the Lisinopril    Labwork: None ordered  Testing/Procedures: None ordered  Follow-Up: Your physician wants you to follow-up in: [place].  You will receive a reminder letter in the mail two months in advance. If you don't receive a letter, please call our office to schedule the follow-up appointment.    Any Other Special Instructions Will Be Listed Below (If Applicable).  1.  Get a scale an weigh yourself on a daily basis  2.  LIMIT your FLUID INTAKE to 1.5 LITERS A DAY  3.  Folow-up with Dr. Regis Bill   If you need a refill on your cardiac medications before your next appointment, please call your pharmacy.

## 2015-07-02 ENCOUNTER — Telehealth: Payer: Self-pay | Admitting: Internal Medicine

## 2015-07-02 ENCOUNTER — Encounter: Payer: Self-pay | Admitting: Internal Medicine

## 2015-07-02 NOTE — Telephone Encounter (Signed)
Pt also sent mychart message.  Forwarded to [facility].

## 2015-07-02 NOTE — Telephone Encounter (Signed)
[name] pt ask that you give her a call said she wanted to tell u about her foot. Pt is aware that this is your afternoon off.

## 2015-07-07 ENCOUNTER — Encounter: Payer: Self-pay | Admitting: Internal Medicine

## 2015-07-07 ENCOUNTER — Ambulatory Visit (INDEPENDENT_AMBULATORY_CARE_PROVIDER_SITE_OTHER): Payer: PPO | Admitting: Internal Medicine

## 2015-07-07 VITALS — BP 116/70 | HR 79 | Temp 97.9°F

## 2015-07-07 DIAGNOSIS — R609 Edema, unspecified: Secondary | ICD-10-CM

## 2015-07-07 DIAGNOSIS — D519 Vitamin B12 deficiency anemia, unspecified: Secondary | ICD-10-CM

## 2015-07-07 DIAGNOSIS — L03115 Cellulitis of right lower limb: Secondary | ICD-10-CM

## 2015-07-07 DIAGNOSIS — E538 Deficiency of other specified B group vitamins: Secondary | ICD-10-CM | POA: Diagnosis not present

## 2015-07-07 MED ORDER — CYANOCOBALAMIN 1000 MCG/ML IJ SOLN
1000.0000 ug | Freq: Once | INTRAMUSCULAR | Status: AC
  Administered 2015-07-07: 1000 ug via INTRAMUSCULAR
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
## 2015-07-07 NOTE — Patient Instructions (Signed)
Continue the   Antibiotic  And lower dose lasix as planned buy cardiology Elevated foot moisturize   the area to  Avoid cracking . Skin .  ROV  in about a week   Plan labs and assess diuretic etc at that time.

## 2015-07-07 NOTE — Addendum Note (Signed)
Addended by: Miles Costain T on: 07/07/2015 09:43 AM   Modules accepted: Orders

## 2015-07-14 ENCOUNTER — Ambulatory Visit (INDEPENDENT_AMBULATORY_CARE_PROVIDER_SITE_OTHER): Payer: PPO | Admitting: Internal Medicine

## 2015-07-14 ENCOUNTER — Encounter: Payer: Self-pay | Admitting: Internal Medicine

## 2015-07-14 VITALS — BP 124/74 | HR 89 | Temp 98.6°F | Ht 62.0 in

## 2015-07-14 DIAGNOSIS — M79671 Pain in right foot: Secondary | ICD-10-CM

## 2015-07-14 DIAGNOSIS — L03115 Cellulitis of right lower limb: Secondary | ICD-10-CM

## 2015-07-14 DIAGNOSIS — R609 Edema, unspecified: Secondary | ICD-10-CM | POA: Diagnosis not present

## 2015-07-14 DIAGNOSIS — I10 Essential (primary) hypertension: Secondary | ICD-10-CM

## 2015-07-14 LAB — BASIC METABOLIC PANEL
BUN: 19 mg/dL (ref 6–23)
CHLORIDE: 104 meq/L (ref 96–112)
CO2: 30 meq/L (ref 19–32)
CREATININE: 0.82 mg/dL (ref 0.40–1.20)
Calcium: 9.4 mg/dL (ref 8.4–10.5)
GFR: 74.16 mL/min (ref >60.00)
GLUCOSE: 96 mg/dL (ref 70–99)
Potassium: 4.4 mEq/L (ref 3.5–5.1)
Sodium: 144 mEq/L (ref 135–145)

## 2015-07-14 LAB — SEDIMENTATION RATE: Sed Rate: 81 mm/hr — ABNORMAL HIGH (ref 0–22)

## 2015-07-14 MED ORDER — DOXYCYCLINE HYCLATE 100 MG PO CAPS: 100.0000 mg | ORAL_CAPSULE | Freq: Two times a day (BID) | ORAL | Status: DC
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
## 2015-07-14 NOTE — Patient Instructions (Signed)
Change antibiotic .  Get Dr.  Lynann Bologna appt to het her opinion also .   bp is good today.  Go back on the statin medicine   After more improvement .  Lab Results  Component Value Date   WBC 10.8* 06/29/2015   HGB 12.9 06/29/2015   HCT 39.7 06/29/2015   PLT 185.0 06/29/2015   GLUCOSE 102* 06/29/2015   CHOL 152 04/15/2015   TRIG 116.0 04/15/2015   HDL 53.50 04/15/2015   LDLDIRECT 139.4 06/30/2010   LDLCALC 76 04/15/2015   ALT 13 04/15/2015   AST 18 04/15/2015   NA 140 06/29/2015   K 4.8 06/29/2015   CL 102 06/29/2015   CREATININE 0.97 06/29/2015   BUN 23 06/29/2015   CO2 28 06/29/2015   TSH 1.06 04/15/2015   INR 1.04 03/18/2011   HGBA1C 6.1 04/15/2015   MICROALBUR 0.2 07/09/2009

## 2015-07-15 ENCOUNTER — Encounter: Payer: Self-pay | Admitting: Internal Medicine

## 2015-07-20 ENCOUNTER — Ambulatory Visit (HOSPITAL_BASED_OUTPATIENT_CLINIC_OR_DEPARTMENT_OTHER)
Admission: RE | Admit: 2015-07-20 | Discharge: 2015-07-20 | Disposition: A | Discharge status: Home / Self Care | Payer: PPO | Source: Ambulatory Visit | Attending: Cardiology | Admitting: Cardiology

## 2015-07-20 ENCOUNTER — Other Ambulatory Visit: Payer: Self-pay | Admitting: Orthopedic Surgery

## 2015-07-20 DIAGNOSIS — E662 Morbid (severe) obesity with alveolar hypoventilation: Secondary | ICD-10-CM | POA: Diagnosis present

## 2015-07-20 DIAGNOSIS — I119 Hypertensive heart disease without heart failure: Secondary | ICD-10-CM | POA: Diagnosis present

## 2015-07-20 DIAGNOSIS — E039 Hypothyroidism, unspecified: Secondary | ICD-10-CM | POA: Diagnosis present

## 2015-07-20 DIAGNOSIS — Z7901 Long term (current) use of anticoagulants: Secondary | ICD-10-CM | POA: Diagnosis not present

## 2015-07-20 DIAGNOSIS — Z8673 Personal history of transient ischemic attack (TIA), and cerebral infarction without residual deficits: Secondary | ICD-10-CM | POA: Diagnosis not present

## 2015-07-20 DIAGNOSIS — Z9119 Patient's noncompliance with other medical treatment and regimen: Secondary | ICD-10-CM | POA: Diagnosis not present

## 2015-07-20 DIAGNOSIS — Z96653 Presence of artificial knee joint, bilateral: Secondary | ICD-10-CM | POA: Diagnosis present

## 2015-07-20 DIAGNOSIS — M79604 Pain in right leg: Secondary | ICD-10-CM

## 2015-07-20 DIAGNOSIS — I482 Chronic atrial fibrillation: Secondary | ICD-10-CM | POA: Diagnosis present

## 2015-07-20 DIAGNOSIS — E785 Hyperlipidemia, unspecified: Secondary | ICD-10-CM | POA: Diagnosis present

## 2015-07-20 DIAGNOSIS — I10 Essential (primary) hypertension: Secondary | ICD-10-CM | POA: Insufficient documentation

## 2015-07-20 DIAGNOSIS — L03115 Cellulitis of right lower limb: Secondary | ICD-10-CM | POA: Diagnosis present

## 2015-07-20 DIAGNOSIS — Z6841 Body Mass Index (BMI) 40.0 and over, adult: Secondary | ICD-10-CM | POA: Diagnosis not present

## 2015-07-22 ENCOUNTER — Encounter (HOSPITAL_COMMUNITY): Payer: Self-pay | Admitting: Cardiology

## 2015-07-22 ENCOUNTER — Inpatient Hospital Stay (HOSPITAL_COMMUNITY): Payer: PPO

## 2015-07-22 ENCOUNTER — Emergency Department (HOSPITAL_COMMUNITY): Payer: PPO

## 2015-07-22 ENCOUNTER — Inpatient Hospital Stay (HOSPITAL_COMMUNITY)
Admission: EM | Admit: 2015-07-22 | Discharge: 2015-07-25 | DRG: 603 | Disposition: A | Discharge status: Home / Self Care | Payer: PPO | Attending: Internal Medicine | Admitting: Internal Medicine

## 2015-07-22 ENCOUNTER — Telehealth: Payer: Self-pay | Admitting: Internal Medicine

## 2015-07-22 ENCOUNTER — Ambulatory Visit: Payer: PPO | Admitting: Family Medicine

## 2015-07-22 DIAGNOSIS — Z96653 Presence of artificial knee joint, bilateral: Secondary | ICD-10-CM | POA: Diagnosis present

## 2015-07-22 DIAGNOSIS — R52 Pain, unspecified: Secondary | ICD-10-CM

## 2015-07-22 DIAGNOSIS — I482 Chronic atrial fibrillation, unspecified: Secondary | ICD-10-CM | POA: Diagnosis present

## 2015-07-22 DIAGNOSIS — E039 Hypothyroidism, unspecified: Secondary | ICD-10-CM | POA: Diagnosis present

## 2015-07-22 DIAGNOSIS — Z8673 Personal history of transient ischemic attack (TIA), and cerebral infarction without residual deficits: Secondary | ICD-10-CM

## 2015-07-22 DIAGNOSIS — Z6841 Body Mass Index (BMI) 40.0 and over, adult: Secondary | ICD-10-CM

## 2015-07-22 DIAGNOSIS — Z7901 Long term (current) use of anticoagulants: Secondary | ICD-10-CM

## 2015-07-22 DIAGNOSIS — E785 Hyperlipidemia, unspecified: Secondary | ICD-10-CM | POA: Diagnosis present

## 2015-07-22 DIAGNOSIS — I119 Hypertensive heart disease without heart failure: Secondary | ICD-10-CM | POA: Diagnosis present

## 2015-07-22 DIAGNOSIS — Z9119 Patient's noncompliance with other medical treatment and regimen: Secondary | ICD-10-CM | POA: Diagnosis not present

## 2015-07-22 DIAGNOSIS — L0390 Cellulitis, unspecified: Secondary | ICD-10-CM | POA: Diagnosis present

## 2015-07-22 DIAGNOSIS — G4733 Obstructive sleep apnea (adult) (pediatric): Secondary | ICD-10-CM | POA: Diagnosis present

## 2015-07-22 DIAGNOSIS — E662 Morbid (severe) obesity with alveolar hypoventilation: Secondary | ICD-10-CM | POA: Diagnosis present

## 2015-07-22 DIAGNOSIS — L03115 Cellulitis of right lower limb: Secondary | ICD-10-CM | POA: Diagnosis present

## 2015-07-22 DIAGNOSIS — L539 Erythematous condition, unspecified: Secondary | ICD-10-CM

## 2015-07-22 LAB — CBC WITH DIFFERENTIAL/PLATELET
Basophils Absolute: 0 10*3/uL (ref 0.0–0.1)
Basophils Relative: 0 %
EOS ABS: 0.1 10*3/uL (ref 0.0–0.7)
EOS PCT: 1 %
HCT: 39.3 % (ref 36.0–46.0)
Hemoglobin: 12.6 g/dL (ref 12.0–15.0)
LYMPHS ABS: 0.9 10*3/uL (ref 0.7–4.0)
LYMPHS PCT: 8 %
MCH: 33 pg (ref 26.0–34.0)
MCHC: 32.1 g/dL (ref 30.0–36.0)
MCV: 102.9 fL — AB (ref 78.0–100.0)
MONO ABS: 1 10*3/uL (ref 0.1–1.0)
MONOS PCT: 9 %
Neutro Abs: 9.1 10*3/uL — ABNORMAL HIGH (ref 1.7–7.7)
Neutrophils Relative %: 82 %
PLATELETS: 176 10*3/uL (ref 150–400)
RBC: 3.82 MIL/uL — AB (ref 3.87–5.11)
RDW: 14.3 % (ref 11.5–15.5)
WBC: 11.1 10*3/uL — ABNORMAL HIGH (ref 4.0–10.5)

## 2015-07-22 LAB — I-STAT CHEM 8, ED
BUN: 22 mg/dL — ABNORMAL HIGH (ref 6–20)
CALCIUM ION: 0.96 mmol/L — AB (ref 1.13–1.30)
CHLORIDE: 103 mmol/L (ref 101–111)
Creatinine, Ser: 0.8 mg/dL (ref 0.44–1.00)
GLUCOSE: 103 mg/dL — AB (ref 65–99)
HCT: 42 % (ref 36.0–46.0)
Hemoglobin: 14.3 g/dL (ref 12.0–15.0)
Potassium: 3.5 mmol/L (ref 3.5–5.1)
Sodium: 138 mmol/L (ref 135–145)
TCO2: 21 mmol/L (ref 0–100)

## 2015-07-22 LAB — SEDIMENTATION RATE: SED RATE: 70 mm/h — AB (ref 0–22)

## 2015-07-22 MED ORDER — TRAMADOL HCL 50 MG PO TABS
50.0000 mg | ORAL_TABLET | Freq: Four times a day (QID) | ORAL | Status: DC | PRN
  Administered 2015-07-22 – 2015-07-24 (×4): 50 mg via ORAL
  Filled 2015-07-22 (×5): qty 1

## 2015-07-22 MED ORDER — OXYCODONE HCL 5 MG PO TABS
5.0000 mg | ORAL_TABLET | ORAL | Status: DC | PRN
  Administered 2015-07-22 – 2015-07-24 (×9): 5 mg via ORAL
  Filled 2015-07-22 (×9): qty 1

## 2015-07-22 MED ORDER — FUROSEMIDE 20 MG PO TABS
20.0000 mg | ORAL_TABLET | Freq: Every day | ORAL | Status: DC
  Administered 2015-07-23 – 2015-07-24 (×2): 20 mg via ORAL
  Filled 2015-07-22 (×2): qty 1

## 2015-07-22 MED ORDER — OXYCODONE-ACETAMINOPHEN 5-325 MG PO TABS
1.0000 | ORAL_TABLET | Freq: Once | ORAL | Status: AC
  Administered 2015-07-22: 1 via ORAL
  Filled 2015-07-22: qty 1

## 2015-07-22 MED ORDER — ACETAMINOPHEN 325 MG PO TABS
650.0000 mg | ORAL_TABLET | Freq: Four times a day (QID) | ORAL | Status: DC | PRN
  Administered 2015-07-24 – 2015-07-25 (×3): 650 mg via ORAL
  Filled 2015-07-22 (×3): qty 2

## 2015-07-22 MED ORDER — VANCOMYCIN HCL 10 G IV SOLR
1250.0000 mg | Freq: Two times a day (BID) | INTRAVENOUS | Status: DC
  Administered 2015-07-23 – 2015-07-24 (×3): 1250 mg via INTRAVENOUS
  Filled 2015-07-22 (×4): qty 1250

## 2015-07-22 MED ORDER — ATENOLOL 50 MG PO TABS
50.0000 mg | ORAL_TABLET | Freq: Every day | ORAL | Status: DC
  Administered 2015-07-23 – 2015-07-25 (×3): 50 mg via ORAL
  Filled 2015-07-22 (×3): qty 1

## 2015-07-22 MED ORDER — SODIUM CHLORIDE 0.9 % IJ SOLN
3.0000 mL | Freq: Two times a day (BID) | INTRAMUSCULAR | Status: DC
  Administered 2015-07-24: 3 mL via INTRAVENOUS

## 2015-07-22 MED ORDER — LEVOTHYROXINE SODIUM 50 MCG PO TABS
75.0000 ug | ORAL_TABLET | Freq: Every day | ORAL | Status: DC
  Administered 2015-07-23 – 2015-07-25 (×3): 75 ug via ORAL
  Filled 2015-07-22 (×3): qty 1

## 2015-07-22 MED ORDER — ACETAMINOPHEN 650 MG RE SUPP: 650.0000 mg | Freq: Four times a day (QID) | RECTAL | Status: DC | PRN

## 2015-07-22 MED ORDER — ATENOLOL 25 MG PO TABS
25.0000 mg | ORAL_TABLET | Freq: Every evening | ORAL | Status: DC
Start: 2015-07-22 — End: 2015-07-25
  Administered 2015-07-22 – 2015-07-24 (×3): 25 mg via ORAL
  Filled 2015-07-22 (×3): qty 1

## 2015-07-22 MED ORDER — VANCOMYCIN HCL 10 G IV SOLR
2500.0000 mg | Freq: Once | INTRAVENOUS | Status: AC
  Administered 2015-07-22: 2500 mg via INTRAVENOUS
  Filled 2015-07-22: qty 2500

## 2015-07-22 MED ORDER — PIPERACILLIN-TAZOBACTAM 3.375 G IVPB
3.3750 g | Freq: Three times a day (TID) | INTRAVENOUS | Status: DC
  Administered 2015-07-23 – 2015-07-24 (×4): 3.375 g via INTRAVENOUS
  Filled 2015-07-22 (×6): qty 50

## 2015-07-22 MED ORDER — DOCUSATE SODIUM 100 MG PO CAPS
100.0000 mg | ORAL_CAPSULE | Freq: Two times a day (BID) | ORAL | Status: DC
  Administered 2015-07-23 (×2): 100 mg via ORAL
  Filled 2015-07-22 (×4): qty 1

## 2015-07-22 MED ORDER — ALBUTEROL SULFATE (2.5 MG/3ML) 0.083% IN NEBU: 2.5000 mg | INHALATION_SOLUTION | RESPIRATORY_TRACT | Status: DC | PRN

## 2015-07-22 MED ORDER — ONDANSETRON HCL 4 MG PO TABS: 4.0000 mg | ORAL_TABLET | Freq: Four times a day (QID) | ORAL | Status: DC | PRN

## 2015-07-22 MED ORDER — SODIUM CHLORIDE 0.9 % IJ SOLN: 3.0000 mL | INTRAMUSCULAR | Status: DC | PRN

## 2015-07-22 MED ORDER — GADOBENATE DIMEGLUMINE 529 MG/ML IV SOLN
20.0000 mL | Freq: Once | INTRAVENOUS | Status: AC | PRN
  Administered 2015-07-22: 20 mL via INTRAVENOUS

## 2015-07-22 MED ORDER — ALLOPURINOL 100 MG PO TABS
200.0000 mg | ORAL_TABLET | Freq: Every day | ORAL | Status: DC
  Administered 2015-07-23 – 2015-07-25 (×3): 200 mg via ORAL
  Filled 2015-07-22 (×3): qty 2

## 2015-07-22 MED ORDER — RIVAROXABAN 20 MG PO TABS
20.0000 mg | ORAL_TABLET | Freq: Every day | ORAL | Status: DC
  Administered 2015-07-22 – 2015-07-24 (×3): 20 mg via ORAL
  Filled 2015-07-22 (×3): qty 1

## 2015-07-22 MED ORDER — PIPERACILLIN-TAZOBACTAM 3.375 G IVPB 30 MIN
3.3750 g | Freq: Once | INTRAVENOUS | Status: AC
  Administered 2015-07-22: 3.375 g via INTRAVENOUS
  Filled 2015-07-22: qty 50

## 2015-07-22 MED ORDER — ONDANSETRON HCL 4 MG/2ML IJ SOLN
4.0000 mg | Freq: Four times a day (QID) | INTRAMUSCULAR | Status: DC | PRN
  Administered 2015-07-24: 4 mg via INTRAVENOUS
  Filled 2015-07-22: qty 2

## 2015-07-22 MED ORDER — SODIUM CHLORIDE 0.9 % IV SOLN: 250.0000 mL | INTRAVENOUS | Status: DC | PRN

## 2015-07-22 MED ORDER — DICLOFENAC SODIUM 1 % TD GEL
2.0000 g | TRANSDERMAL | Status: DC | PRN
  Filled 2015-07-22: qty 100
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
## 2015-07-22 NOTE — ED Notes (Signed)
Attempted report X1 

## 2015-07-22 NOTE — Progress Notes (Signed)
Pt admitted from ED this evening. Alert, oriented and able to voice needs. No complaints expressed at this time

## 2015-07-22 NOTE — ED Notes (Signed)
Lab at bedside, IV team at bedside

## 2015-07-22 NOTE — ED Notes (Signed)
Pt monitored by pulse ox and bp cuff.
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
## 2015-07-22 NOTE — ED Notes (Signed)
Reports pain in the right ankle, current on medication for cellulitis infection. Reports pain is worse today and redness is spread more.

## 2015-07-22 NOTE — Telephone Encounter (Signed)
Please advise 

## 2015-07-22 NOTE — Telephone Encounter (Signed)
[facility] - Client [facility] Patient Name: SHANAIYA BENE DOB: 1951-08-11 Initial Comment Caller states Rolla Flatten, treating for cellulitis; saw Dr Mortimer Fries; has , needs compression stockings; felt like stone bruise in RT heel yesterday; now pain up into ankle; has appt w/Dr Mauricio Po today; had xrayed couple weeks ago; wonders if should have another xray before going Nurse Assessment Nurse: Mechele Dawley, RN, Amy Date/Time (Eastern Time): 07/22/2015 9:44:16 AM Confirm and document reason for call. If symptomatic, describe symptoms. ---PAIN STARTED IN THE HEEL TO THE ANKLE THIS MORNING. SHE IS WANTING TO KNOW IF SHE SHOULD GET ANOTHER XRAY TODAY BEFORE SHE SEES THE MD THIS AFTERNOON. PAIN SCALE 10/10. SHE STATES THE PAIN IS THERE WHETHER SHE IS PUTTING PRESSURE ON IT OR NOT. SHE HAS A WALKER AND IF SHE CAN STAND UP SHE CAN WALK A LITTLE BIT. SHE HAS SOMEONE TO TAKE HER TO THE MD THIS AFTERNOON. Has the patient traveled out of the country within the last 30 days? ---Not Applicable Does the patient have any new or worsening symptoms? ---Yes Will a triage be completed? ---Yes Related visit to physician within the last 2 weeks? ---Yes Does the PT have any chronic conditions? (i.e. diabetes, asthma, etc.) ---Yes List chronic conditions. ---HYPERTENSION Guidelines Guideline Title Affirmed Question Affirmed Notes Foot Pain [1] SEVERE pain (e.g., excruciating, unable to do any normal activities) AND [2] not improved after 2 hours of pain medicine Final Disposition User See Physician within 4 Hours (or PCP triage) [place], RN, Amy Comments CALLER Corinne. SHE IS GOING TO THE MD APPT THIS AFTERNOON. SHE WOULD LIKE SOMEONE TO CALL HER AND LET HER KNOW ABOUT SOMETHING FOR PAIN AND ABOUT IF SHE NEEDS ANOTHER X-RAY BEFORE SHE GOES. THE CELLULITIS HAS BEEN  RESOLVED PER THE MD THAT SHE SAW PREVIOUSLY. PLEASE NOTE:

## 2015-07-22 NOTE — Progress Notes (Signed)
Pt stated she does not wear cpap and does not want cpap tonight, RT informed pt if she changes her mind to call for RT
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
## 2015-07-22 NOTE — ED Notes (Signed)
Attempted report X2 

## 2015-07-22 NOTE — ED Notes (Signed)
Pt seen at PCP, started on augmentin then doxycycline; doppler x 2 days ago was negative for DVT, CXR negative as well.
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
## 2015-07-22 NOTE — ED Notes (Signed)
Attempted report X3

## 2015-07-22 NOTE — ED Notes (Signed)
Admitting at bedside

## 2015-07-23 ENCOUNTER — Inpatient Hospital Stay (HOSPITAL_COMMUNITY): Payer: PPO

## 2015-07-23 DIAGNOSIS — R52 Pain, unspecified: Secondary | ICD-10-CM

## 2015-07-23 LAB — BASIC METABOLIC PANEL
Anion gap: 9 (ref 5–15)
BUN: 18 mg/dL (ref 6–20)
CHLORIDE: 101 mmol/L (ref 101–111)
CO2: 27 mmol/L (ref 22–32)
CREATININE: 0.99 mg/dL (ref 0.44–1.00)
Calcium: 8.6 mg/dL — ABNORMAL LOW (ref 8.9–10.3)
GFR calc Af Amer: >60 mL/min (ref >60)
GFR calc non Af Amer: 59 mL/min — ABNORMAL LOW (ref >60)
GLUCOSE: 114 mg/dL — AB (ref 65–99)
POTASSIUM: 3.3 mmol/L — AB (ref 3.5–5.1)
SODIUM: 137 mmol/L (ref 135–145)

## 2015-07-23 LAB — CBC
HEMATOCRIT: 36 % (ref 36.0–46.0)
Hemoglobin: 11.8 g/dL — ABNORMAL LOW (ref 12.0–15.0)
MCH: 33.9 pg (ref 26.0–34.0)
MCHC: 32.8 g/dL (ref 30.0–36.0)
MCV: 103.4 fL — AB (ref 78.0–100.0)
PLATELETS: 191 10*3/uL (ref 150–400)
RBC: 3.48 MIL/uL — ABNORMAL LOW (ref 3.87–5.11)
RDW: 14.6 % (ref 11.5–15.5)
WBC: 9.7 10*3/uL (ref 4.0–10.5)

## 2015-07-23 LAB — HIV ANTIBODY (ROUTINE TESTING W REFLEX): HIV Screen 4th Generation wRfx: NONREACTIVE

## 2015-07-23 MED ORDER — POTASSIUM CHLORIDE CRYS ER 20 MEQ PO TBCR
40.0000 meq | EXTENDED_RELEASE_TABLET | Freq: Every day | ORAL | Status: DC
  Administered 2015-07-23 – 2015-07-25 (×3): 40 meq via ORAL
  Filled 2015-07-23 (×3): qty 2

## 2015-07-23 NOTE — Progress Notes (Signed)
Pt continues to refuse cpap for the night, RT informed pt to call for RT if she changes her mind during the night

## 2015-07-23 NOTE — Progress Notes (Addendum)
VASCULAR LAB PRELIMINARY  ARTERIAL  ABI completed:    RIGHT    LEFT    PRESSURE WAVEFORM  PRESSURE WAVEFORM  BRACHIAL 135 Triphasic  BRACHIAL 144 Triphasic   DP 116 Triphasic  DP 133 Triphasic   AT   AT    PT 140 Triphasic  PT 135 Triphasic   PER   PER    GREAT TOE  NA GREAT TOE  NA    RIGHT LEFT  ABI 0.97 0.94     Mykale Gandolfo, RVT 07/23/2015, 3:47 PM

## 2015-07-23 NOTE — Evaluation (Signed)
Physical Therapy Evaluation Patient Details Name: [name] MRN: [id number] DOB: 1951/05/25 Today's Date: 07/23/2015   History of Present Illness  64 y/o WF admitted with cellulitis R foot  Clinical Impression  Pt admitted with above diagnosis. Pt currently with functional limitations due to the deficits listed below (see PT Problem List).  Pt will benefit from skilled PT to increase their independence and safety with mobility to allow discharge to the venue listed below.  Pt in too much pain to attempt ambulation, but does want to try and walk when pain level is down.  Pt self directs session and feels that once pain level is down she will be back to her PLOF.  At this time not recommending any follow up PT once d/c, but will con't to assess.     Follow Up Recommendations No PT follow up    Equipment Recommendations  None recommended by PT    Recommendations for Other Services       Precautions / Restrictions Precautions Precautions: Fall Restrictions Weight Bearing Restrictions: No      Mobility  Bed Mobility               General bed mobility comments: Pt up in recliner upon arrival with NT about to A pt to [facility]  Transfers Overall transfer level: Needs assistance Equipment used: Rolling walker (2 wheeled) Transfers: Stand Pivot Transfers;Sit to/from Stand Sit to Stand: Min guard Stand pivot transfers: Min guard       General transfer comment: Pt has to rock and use momentum to stand.  Pt is resistive to suggestions by PT stating she has had a lot of therapy before for knees and shoulders.  Short small steps with SPT.  Ambulation/Gait             General Gait Details: Pt refused ambulation due to pain.  Stairs            Wheelchair Mobility    Modified Rankin (Stroke Patients Only)       Balance Overall balance assessment: Needs assistance           Standing balance-Leahy Scale: Fair Standing balance comment: Pt able to wipe self  with 1 UE on RW and pull up underwear.                             Pertinent Vitals/Pain Pain Assessment: 0-10 Pain Score: 8  Pain Location: bottom of R foot Pain Descriptors / Indicators: Aching Pain Intervention(s): Repositioned    Home Living Family/patient expects to be discharged to:: Private residence Living Arrangements: Alone Available Help at Discharge: Available PRN/intermittently;Family Type of Home: House Home Access: Stairs to enter Entrance Stairs-Rails: Left Entrance Stairs-Number of Steps: 2 small steps  Home Layout: One level Home Equipment: Shower seat - built in;Walker - 2 wheels      Prior Function Level of Independence: Independent with assistive device(s)         Comments: Normally uses a cane until recent R foot cellulitis     Hand Dominance        Extremity/Trunk Assessment   Upper Extremity Assessment: Defer to OT evaluation           Lower Extremity Assessment: RLE deficits/detail;Overall WFL for tasks assessed         Communication   Communication: No difficulties  Cognition Arousal/Alertness: Awake/alert Behavior During Therapy: WFL for tasks assessed/performed Overall Cognitive Status: Within Functional Limits for  tasks assessed                      General Comments      Exercises        Assessment/Plan    PT Assessment Patient needs continued PT services  PT Diagnosis Acute pain   PT Problem List Pain;Decreased mobility  PT Treatment Interventions DME instruction;Gait training;Functional mobility training;Therapeutic activities;Therapeutic exercise   PT Goals (Current goals can be found in the Care Plan section) Acute Rehab PT Goals Patient Stated Goal: For pain to go away and walk before she leaves the hospital. PT Goal Formulation: With patient Time For Goal Achievement: 07/30/15 Potential to Achieve Goals: Good    Frequency Min 3X/week   Barriers to discharge Decreased caregiver  support      Co-evaluation               End of Session Equipment Utilized During Treatment:  (refused gait belt) Activity Tolerance: Patient limited by pain Patient left: in chair;with call bell/phone within reach Nurse Communication: Mobility status         Time: 1114-1130 PT Time Calculation (min) (ACUTE ONLY): 16 min   Charges:   PT Evaluation $Initial PT Evaluation Tier I: 1 Procedure     PT G Codes:        [place],Neilan Rizzo LUBECK 07/23/2015, 12:21 PM

## 2015-07-23 NOTE — Progress Notes (Signed)
OT Cancellation Note  Patient Details Name: THELDA GAGAN MRN: [id number] DOB: Apr 26, 1951   Cancelled Treatment:    Reason Eval/Treat Not Completed: Pain limiting ability to participate.  Pt in too much pain after PT.  Wants OT to return tomorrow per PT.  Ashling Roane 07/23/2015, 11:36 AM  Lesle Chris, OTR/L [phone] 07/23/2015

## 2015-07-23 NOTE — Care Management Note (Signed)
Case Management Note  Patient Details  Name: CHERE BABSON MRN: [id number] Date of Birth: 1951-03-26  Subjective/Objective:                    Action/Plan:   Expected Discharge Date:                  Expected Discharge Plan:  Home/Self Care  In-House Referral:     Discharge planning Services     Post Acute Care Choice:    Choice offered to:     DME Arranged:    DME Agency:     HH Arranged:    [place] Agency:     Status of Service:  In process, will continue to follow  Medicare Important Message Given:    Date Medicare IM Given:    Medicare IM give by:    Date Additional Medicare IM Given:    Additional Medicare Important Message give by:     If discussed at [place] of Stay Meetings, dates discussed:    Additional Comments: Initial UR completed  Marilu Favre, RN 07/23/2015, 8:04 AM
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
## 2015-07-24 LAB — BASIC METABOLIC PANEL
ANION GAP: 11 (ref 5–15)
BUN: 18 mg/dL (ref 6–20)
CHLORIDE: 100 mmol/L — AB (ref 101–111)
CO2: 26 mmol/L (ref 22–32)
Calcium: 8.6 mg/dL — ABNORMAL LOW (ref 8.9–10.3)
Creatinine, Ser: 1.01 mg/dL — ABNORMAL HIGH (ref 0.44–1.00)
GFR calc Af Amer: >60 mL/min (ref >60)
GFR, EST NON AFRICAN AMERICAN: 57 mL/min — AB (ref >60)
Glucose, Bld: 104 mg/dL — ABNORMAL HIGH (ref 65–99)
POTASSIUM: 3.6 mmol/L (ref 3.5–5.1)
SODIUM: 137 mmol/L (ref 135–145)

## 2015-07-24 LAB — CBC
HEMATOCRIT: 36.7 % (ref 36.0–46.0)
HEMOGLOBIN: 12 g/dL (ref 12.0–15.0)
MCH: 33.8 pg (ref 26.0–34.0)
MCHC: 32.7 g/dL (ref 30.0–36.0)
MCV: 103.4 fL — AB (ref 78.0–100.0)
Platelets: 208 10*3/uL (ref 150–400)
RBC: 3.55 MIL/uL — ABNORMAL LOW (ref 3.87–5.11)
RDW: 14.4 % (ref 11.5–15.5)
WBC: 10.6 10*3/uL — AB (ref 4.0–10.5)

## 2015-07-24 MED ORDER — LEVOFLOXACIN 500 MG PO TABS
500.0000 mg | ORAL_TABLET | Freq: Every day | ORAL | Status: DC
  Administered 2015-07-24 – 2015-07-25 (×2): 500 mg via ORAL
  Filled 2015-07-24 (×2): qty 1

## 2015-07-24 MED ORDER — DOXYCYCLINE HYCLATE 100 MG PO TABS
100.0000 mg | ORAL_TABLET | Freq: Two times a day (BID) | ORAL | Status: DC
  Administered 2015-07-24 – 2015-07-25 (×3): 100 mg via ORAL
  Filled 2015-07-24 (×3): qty 1

## 2015-07-24 NOTE — Evaluation (Signed)
Occupational Therapy Evaluation Patient Details Name: [name] MRN: [id number] DOB: [birth year] Today's Date: 07/24/2015    History of Present Illness 64 y/o WF admitted with cellulitis R foot   Clinical Impression   Patient evaluated by Occupational Therapy with no further acute OT needs identified. All education has been completed and the patient has no further questions. See below for any follow-up Occupational Therapy or equipment needs. OT to sign off. Thank you for referral.   Educated patient on pain management and requesting pain medication to help with pain control. Pt 6 out 10 pain but had been 7 hours without medication. Pt is allowed medication every 4 hours. Pt encouraged to control pain to allow for maximized independence.     Follow Up Recommendations  No OT follow up    Equipment Recommendations  None recommended by OT    Recommendations for Other Services       Precautions / Restrictions Precautions Precautions: Fall      Mobility Bed Mobility Overal bed mobility: Needs Assistance Bed Mobility: Sit to Supine       Sit to supine: Min assist   General bed mobility comments: (A) for BIL LE ( pt placing single le at a time due to body habitus)  Transfers Overall transfer level: Needs assistance Equipment used: Rolling walker (2 wheeled) Transfers: Sit to/from Stand Sit to Stand: Supervision         General transfer comment: pt able to stand and turn to bed. pt able to side step toward [facility]    Balance Overall balance assessment: Needs assistance           Standing balance-Leahy Scale: Fair                              ADL Overall ADL's : Needs assistance/impaired Eating/Feeding: Independent   Grooming: Wash/dry hands;Independent                   Toilet Transfer: Warehouse manager and Hygiene: Supervision/safety       Functional mobility during ADLs:  Supervision/safety General ADL Comments: pt able to reach bil LE sitting for dressing and foot wear. Pt is at or near baseline for adls. pt limited by pain in R foot     Vision     Perception     Praxis      Pertinent Vitals/Pain Pain Assessment: 0-10 Pain Score: 6  Pain Location: R foot Pain Descriptors / Indicators: Discomfort;Constant Pain Intervention(s): Repositioned;Monitored during session;RN gave pain meds during session (reports goal for pain is 2-3 to be able to d/c home)     Hand Dominance Right   Extremity/Trunk Assessment Upper Extremity Assessment Upper Extremity Assessment: Overall WFL for tasks assessed   Lower Extremity Assessment Lower Extremity Assessment: Defer to PT evaluation   Cervical / Trunk Assessment Cervical / Trunk Assessment: Normal   Communication Communication Communication: No difficulties   Cognition Arousal/Alertness: Awake/alert Behavior During Therapy: WFL for tasks assessed/performed Overall Cognitive Status: Within Functional Limits for tasks assessed                     General Comments       Exercises       Shoulder Instructions      Home Living Family/patient expects to be discharged to:: Private residence Living Arrangements: Alone Available Help at Discharge: Available PRN/intermittently;Family Type of Home: House Home Access: Stairs  to enter Entrance Stairs-Number of Steps: 2 small steps  Entrance Stairs-Rails: Left Home Layout: One level     Bathroom Shower/Tub: Occupational psychologist: Standard     Home Equipment: Shower seat - built in;Walker - 2 wheels          Prior Functioning/Environment Level of Independence: Independent with assistive device(s)        Comments: Normally uses a cane until recent R foot cellulitis    OT Diagnosis: Acute pain   OT Problem List:     OT Treatment/Interventions:      OT Goals(Current goals can be found in the care plan section) Acute  Rehab OT Goals Patient Stated Goal: Pt reports that I need to be pain level 2-3 to go home safely by myself  OT Frequency:     Barriers to D/C:            Co-evaluation              End of Session Equipment Utilized During Treatment: Rolling walker Nurse Communication: Mobility status;Precautions  Activity Tolerance: Patient tolerated treatment well Patient left: in bed;with call bell/phone within reach;with nursing/sitter in room   Time: 1346-1409 OT Time Calculation (min): 23 min Charges:  OT General Charges $OT Visit: 1 Procedure OT Evaluation $Initial OT Evaluation Tier I: 1 Procedure OT Treatments $Self Care/Home Management : 8-22 mins G-Codes:    Parke Poisson B 2015/08/21, 3:27 PM  I agree with the following treatment note after reviewing documentation.   Jeri Modena OTR/L Pager: [phone] Office: [phone] .
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
## 2015-07-24 NOTE — Progress Notes (Signed)
Physical Therapy Treatment Patient Details Name: [name] MRN: [id number] DOB: [birth year] Today's Date: 07/24/2015    History of Present Illness 64 y/o WF admitted on 07/22/15 with cellulitis R foot w/ PMH of CVA, AFIB, HTN, Arthritis, obesity, and osteopetrosis    PT Comments    The pt requires a good amount of persuasion to participate in therapy.  She will complete a task if she lets you do it her way.  The pt was very adamant about not walking in the hallway but consented to gait training around the room.  She is limited to short ambulation distances and needs to rest due to back pain.  She was able to sit EOB and perform general LE exercises and reported no pain or discomfort with them.  HEP issued regarding exercises.  Pt doesn't want to go home till her pain goes down to a 2 or 3, pt encouraged to elevate leg and take pain medicines to decrease pain level.   Pt needs to practice stairs if she is D/C home, also needs to try to walk in hallway.       Follow Up Recommendations  No PT follow up     Equipment Recommendations  None recommended by PT       Precautions / Restrictions Precautions Precautions: Fall    Mobility  Bed Mobility Overal bed mobility: Needs Assistance Bed Mobility: Supine to Sit;Sit to Supine     Supine to sit: Supervision Sit to supine: Min assist   General bed mobility comments: Supervision for safety and cues for hand placement and technique when getting to EOB.  Min assist to bring bil LE back into bed with supine <-> sit.  Pt. able to perform bridging to scoot herself up in bed in trendelenburg position. Pt. cannot lay flat for long periods due to reported panic attacks.  Therefore, HOB was maximally elevated at end of treatment.    Transfers Overall transfer level: Needs assistance Equipment used: Rolling walker (2 wheeled) Transfers: Sit to/from Stand Sit to Stand: Supervision         General transfer comment: supervision for safety  and minimal verbal cues for technique.  Pt. has general forward flexed posture while standing.    Ambulation/Gait Ambulation/Gait assistance: Supervision Ambulation Distance (Feet): 30 Feet Assistive device: Rolling walker (2 wheeled) Gait Pattern/deviations: Step-to pattern;Trunk flexed Gait velocity: decreased Gait velocity interpretation: <1.8 ft/sec, indicative of risk for recurrent falls General Gait Details: Pt. ambulated 1 lap around the room but did not want to go out into the hallway.  Supervision for safety, pt. able to maneuver around objects in room.  Has a very forward flexed posture and would lean forearms onto RW secondary to reported back pain.       Balance Overall balance assessment: Needs assistance Sitting-balance support: Feet supported Sitting balance-Leahy Scale: Good     Standing balance support: Bilateral upper extremity supported Standing balance-Leahy Scale: Fair                      Cognition Arousal/Alertness: Awake/alert Behavior During Therapy: WFL for tasks assessed/performed Overall Cognitive Status: Within Functional Limits for tasks assessed                      Exercises General Exercises - Lower Extremity Long Arc Quad: Strengthening;Both;10 reps;Seated (5 sec hold) Hip Flexion/Marching: AROM;Both;20 reps;Seated Toe Raises: AROM;Both;20 reps;Seated Heel Raises: AROM;Both;20 reps;Seated        Pertinent Vitals/Pain Pain  Assessment: 0-10 Pain Score: 5  Pain Location: R foot Pain Descriptors / Indicators: Aching Pain Intervention(s): Limited activity within patient's tolerance;Monitored during session;Repositioned;RN gave pain meds during session           PT Goals (current goals can now be found in the care plan section) Acute Rehab PT Goals Patient Stated Goal: Pt reports that I need to be pain level 2-3 to go home safely by myself Progress towards PT goals: Progressing toward goals    Frequency  Min 3X/week     PT Plan Current plan remains appropriate       End of Session Equipment Utilized During Treatment:  (unable to use gait belt due to pt.'s obesity) Activity Tolerance: Patient limited by pain;Patient limited by fatigue Patient left: in bed;with call bell/phone within reach     Time: 1535-1600 PT Time Calculation (min) (ACUTE ONLY): 25 min  Charges:  1 Gait, [place], [place] - Office   07/24/2015, 4:15 PM

## 2015-07-25 LAB — CBC
HCT: 34.1 % — ABNORMAL LOW (ref 36.0–46.0)
HEMOGLOBIN: 10.7 g/dL — AB (ref 12.0–15.0)
MCH: 32.4 pg (ref 26.0–34.0)
MCHC: 31.4 g/dL (ref 30.0–36.0)
MCV: 103.3 fL — ABNORMAL HIGH (ref 78.0–100.0)
PLATELETS: 179 10*3/uL (ref 150–400)
RBC: 3.3 MIL/uL — ABNORMAL LOW (ref 3.87–5.11)
RDW: 14.4 % (ref 11.5–15.5)
WBC: 8.4 10*3/uL (ref 4.0–10.5)

## 2015-07-25 LAB — BASIC METABOLIC PANEL
ANION GAP: 8 (ref 5–15)
BUN: 18 mg/dL (ref 6–20)
CO2: 28 mmol/L (ref 22–32)
Calcium: 8.4 mg/dL — ABNORMAL LOW (ref 8.9–10.3)
Chloride: 100 mmol/L — ABNORMAL LOW (ref 101–111)
Creatinine, Ser: 1.13 mg/dL — ABNORMAL HIGH (ref 0.44–1.00)
GFR calc Af Amer: 58 mL/min — ABNORMAL LOW (ref >60)
GFR calc non Af Amer: 50 mL/min — ABNORMAL LOW (ref >60)
GLUCOSE: 115 mg/dL — AB (ref 65–99)
POTASSIUM: 4.2 mmol/L (ref 3.5–5.1)
Sodium: 136 mmol/L (ref 135–145)

## 2015-07-25 MED ORDER — DOXYCYCLINE HYCLATE 100 MG PO CAPS: 100.0000 mg | ORAL_CAPSULE | Freq: Two times a day (BID) | ORAL | Status: DC

## 2015-07-25 MED ORDER — LEVOFLOXACIN 500 MG PO TABS: 500.0000 mg | ORAL_TABLET | Freq: Every day | ORAL | Status: DC

## 2015-07-25 MED ORDER — OXYCODONE HCL 5 MG PO TABS: 5.0000 mg | ORAL_TABLET | ORAL | Status: DC | PRN

## 2015-07-25 NOTE — Discharge Instructions (Signed)
Cellulitis Cellulitis is an infection of the skin and the tissue beneath it. The infected area is usually red and tender. Cellulitis occurs most often in the arms and lower legs.  CAUSES  Cellulitis is caused by bacteria that enter the skin through cracks or cuts in the skin. The most common types of bacteria that cause cellulitis are staphylococci and streptococci. SIGNS AND SYMPTOMS   Redness and warmth.  Swelling.  Tenderness or pain.  Fever. DIAGNOSIS  Your health care provider can usually determine what is wrong based on a physical exam. Blood tests may also be done. TREATMENT  Treatment usually involves taking an antibiotic medicine. HOME CARE INSTRUCTIONS   Take your antibiotic medicine as directed by your health care provider. Finish the antibiotic even if you start to feel better.  Keep the infected arm or leg elevated to reduce swelling.  Apply a warm cloth to the affected area up to 4 times per day to relieve pain.  Take medicines only as directed by your health care provider.  Keep all follow-up visits as directed by your health care provider. SEEK MEDICAL CARE IF:   You notice red streaks coming from the infected area.  Your red area gets larger or turns dark in color.  Your bone or joint underneath the infected area becomes painful after the skin has healed.  Your infection returns in the same area or another area.  You notice a swollen bump in the infected area.  You develop new symptoms.  You have a fever. SEEK IMMEDIATE MEDICAL CARE IF:   You feel very sleepy.  You develop vomiting or diarrhea.  You have a general ill feeling (malaise) with muscle aches and pains.   This information is not intended to replace advice given to you by your health care provider. Make sure you discuss any questions you have with your health care provider.   Document Released: 06/01/2005 Document Revised: 05/13/2015 Document Reviewed: 11/07/2011 Elsevier Interactive  Patient Education [employer].

## 2015-07-25 NOTE — Discharge Summary (Signed)
[facility]  Physician Discharge Summary   Patient ID: [name] MRN: [id number] DOB/AGE: 07-Oct-1950 64 y.o.  Admit date: 08/09/15 Discharge date: 07/25/2015  PCP: [name], MD  DISCHARGE DIAGNOSES:  Principal Problem:   Cellulitis of right foot Active Problems:   Hypothyroidism   Morbid obesity ([place])   Chronic atrial fibrillation (HCC)   OSA (obstructive sleep apnea)   Chronic anticoagulation   Cellulitis   RECOMMENDATIONS FOR OUTPATIENT FOLLOW UP: 1. Follow up with your PCP next week. 2. Recheck BMET and CBC at follow up.   DISCHARGE CONDITION: fair  Diet recommendation: as before  Filed Weights   07/23/15 0544 07/23/15 2133 07/24/15 0605  Weight: 134 kg (295 lb 6.7 oz) 143.1 kg (315 lb 7.7 oz) 140.9 kg (310 lb 10.1 oz)    INITIAL HISTORY: 64 year old Caucasian female with a past medical history of chronic atrial fibrillation, obesity, hypertension, history of stroke, on chronic anticoagulation, presented with the pain and swelling of her right lower extremity. She was thought to have cellulitis and was hospitalized for further management. She had failed outpatient treatment with multiple courses of antibiotics.  Consultations:  None  Procedures:  None  HOSPITAL COURSE:   Cellulitis of the right lower extremity, superimposed on chronic venous stasis Patient failed outpatient treatment. She was admitted and started on IV antibiotics. Patient appears to have improved with IV antibiotics. She was changed to oral doxycycline and Levaquin. MRI of the right foot as well as right ankle report has been reviewed. No evidence for any abscess or osteomyelitis. Arterial Dopplers show ABI greater than 0.9 bilaterally. She will also benefit from compression stockings when infection has improved. ESR is 70. HIV is nonreactive. She remains afebrile. She is improved enough to be discharged.  History of chronic atrial fibrillation on  anticoagulation Heart rate is well controlled. Continue beta blocker. Continue her Xarelto.   History of hypothyroidism Continue Synthroid.  Overall much improved. Stable for discharge. Mildly elevated creatinine noted. She is making urine. Will need close outpatient monitoring considering that she is on Xarelto.    PERTINENT LABS:  The results of significant diagnostics from this hospitalization (including imaging, microbiology, ancillary and laboratory) are listed below for reference.     Labs: Basic Metabolic Panel:  Recent Labs Lab 2015-08-09 1548 07/23/15 0500 07/24/15 0558 07/25/15 0344  NA 138 137 137 136  K 3.5 3.3* 3.6 4.2  CL 103 101 100* 100*  CO2  --  _0 GLUCOSE 103* 114* 104* 115*  BUN 22* _1 CREATININE 0.80 0.99 1.01* 1.13*  CALCIUM  --  8.6* 8.6* 8.4*   CBC:  Recent Labs Lab 2015/08/09 1548 Aug 09, 2015 1617 07/23/15 0500 07/24/15 0558 07/25/15 0344  WBC  --  11.1* 9.7 10.6* 8.4  NEUTROABS  --  9.1*  --   --   --   HGB 14.3 12.6 11.8* 12.0 10.7*  HCT 42.0 39.3 36.0 36.7 34.1*  MCV  --  102.9* 103.4* 103.4* 103.3*  PLT  --  176 191 208 179    IMAGING STUDIES Dg Ankle Complete Right  2015/08/09  CLINICAL DATA:  Right foot and ankle redness and swelling since mid September, 2016. Initial encounter. EXAM: RIGHT ANKLE - COMPLETE 3+ VIEW COMPARISON:  Plain films of the right defect 06/30/2015 10/10/2013. FINDINGS: No acute bony or joint abnormality is identified. Small plantar calcaneal spur is noted. Sclerosis of all imaged bones is unchanged. Subcutaneous soft tissue calcifications about the lower leg  are consistent with remote infectious or inflammatory process. No soft tissue gas collection is seen. No bony destructive change or periostitis is identified. IMPRESSION: No acute abnormality. Electronically Signed   By: Inge Rise M.D.   On: 07/22/2015 13:44   Mr Foot Right W Wo Contrast  07/23/2015  CLINICAL DATA:  Progressive right foot  and ankle pain and swelling for 2 months with limited response to antibiotics. Venous stasis. Initial encounter. EXAM: MRI OF THE RIGHT FOREFOOT WITHOUT AND WITH CONTRAST TECHNIQUE: Multiplanar, multisequence MR imaging was performed both before and after administration of intravenous contrast. CONTRAST:  76m MULTIHANCE GADOBENATE DIMEGLUMINE 529 MG/ML IV SOLN COMPARISON:  Radiographs 07/21/2005) FINDINGS: Study is mildly motion degraded. Ankle and hindfoot findings are dictated separately. There is diffuse subcutaneous edema throughout the forefoot, especially dorsally. Post-contrast images demonstrate heterogeneous enhancement in the dorsal subcutaneous fat. There is no focal fluid collection. There is mild nonspecific enhancement along the flexor musculature. Mild degenerative changes are present at the first metatarsal phalangeal joint. There is no suspicious synovial enhancement. Multiple erosions are noted along the Lisfranc joint. These appear well demarcated without associated subluxation or osseous destruction. The Lisfranc ligament is intact. IMPRESSION: 1. Subcutaneous edema and enhancement throughout the forefoot, greatest dorsally. Findings are nonspecific and may represent cellulitis or venous insufficiency. No soft tissue abscess identified. 2. No evidence of osteomyelitis. 3. Midfoot arthropathic changes with erosions suggestive of gout. 4. Ankle and hindfoot findings dictated separately. Electronically Signed   By: WRichardean SaleM.D.   On: 07/23/2015 07:52   Mr Ankle Right W Wo Contrast  07/23/2015  CLINICAL DATA:  Progressive right foot and ankle pain and swelling for 2 months with limited response to antibiotics. Venous stasis. Initial encounter. EXAM: MRI OF THE RIGHT ANKLE WITHOUT AND WITH CONTRAST TECHNIQUE: Multiplanar, multisequence MR imaging of the ankle was performed before and after the administration of intravenous contrast. CONTRAST:  218mMULTIHANCE GADOBENATE DIMEGLUMINE 529  MG/ML IV SOLN COMPARISON:  Radiographs 07/22/2015. FINDINGS: Forefoot findings are dictated separately. As in the forefoot, there is diffuse subcutaneous edema and heterogeneous enhancement throughout the visualized lower leg and hindfoot. No focal fluid collections are identified. There is no evidence of foreign body. There is a small ankle joint effusion with nonspecific synovial enhancement extending into the tarsal sinus. There are multiple well-demarcated erosions adjacent to the tarsal sinus and within the midfoot. No bone destruction is identified. There is a small amount of fluid within the posterior tibialis and peroneal tendon sheaths. There is mild peroneal tendinosis without evidence of tear. There is mild thickening of the Achilles tendon without tear. IMPRESSION: IMPRESSION 1. Diffuse subcutaneous edema and enhancement throughout the hindfoot and ankle without evidence of focal soft tissue abscess. Findings are nonspecific and may represent cellulitis or venous insufficiency/ stasis. 2. No evidence of osteomyelitis. 3. Midfoot and hindfoot arthropathic changes as described, suggestive of gout. 4. Forefoot findings dictated separately. Electronically Signed   By: WiRichardean Sale.D.   On: 07/23/2015 07:58   Dg Foot Complete Right  07/22/2015  CLINICAL DATA:  Right foot and ankle redness and swelling since mid September, 2016. Initial encounter. EXAM: RIGHT FOOT COMPLETE - 3+ VIEW COMPARISON:  Plain films of the right foot 06/30/2015 and 10/10/2013. FINDINGS: No acute bony or joint abnormality is identified. Small plantar calcaneal spur is noted. No evidence of arthropathy is seen. Sclerotic appearance of all imaged bones is unchanged. No soft tissue gas collection is identified. IMPRESSION: No acute abnormality. Electronically Signed  By: Inge Rise M.D.   On: 07/22/2015 13:41   Dg Foot Complete Right  06/30/2015  CLINICAL DATA:  Right foot pain and swelling and erythema dorsally without  known trauma, clinically suspected cellulitis. EXAM: RIGHT FOOT COMPLETE - 3+ VIEW COMPARISON:  Right foot series of October 10, 2013 FINDINGS: There is a large amount of soft tissue swelling over the dorsum of the foot. No foreign bodies or gas collections are observed. Soft tissue swelling over the lower leg and ankle is present as well. The bones are adequately mineralized. There is no lytic nor blastic lesion. There is mild narrowing of the DIP joints diffusely with minimal narrowing of the PIP joints. There is no acute fracture nor dislocation. No lytic nor blastic bony lesions are observed. The bones of the hindfoot are intact. There are plantar and Achilles region calcaneal spurs. IMPRESSION: Significant soft tissue swelling over the lower leg, ankle, and dorsum of the foot. This may reflect clinically suspected cellulitis. No foreign body is observed. There is no radiographic evidence of osteomyelitis. Electronically Signed   By: David  Martinique M.D.   On: 06/30/2015 11:08    DISCHARGE EXAMINATION: Filed Vitals:   07/24/15 1429 07/24/15 2100 07/24/15 2140 07/25/15 0519  BP: 103/58 100/81 100/81 116/72  Pulse: 102 81 81 94  Temp: 97.9 F (36.6 C) 98.2 F (36.8 C)  98.1 F (36.7 C)  TempSrc: Oral Oral  Oral  Resp: _0 Height:      Weight:      SpO2: 95% 95%  97%   General appearance: alert, cooperative and no distress Resp: clear to auscultation bilaterally Cardio: regular rate and rhythm, S1, S2 normal, no murmur, click, rub or gallop GI: soft, non-tender; bowel sounds normal; no masses,  no organomegaly Extremities: improved erythema in right foot and lower leg. good ROM. Neurologic: Grossly normal  DISPOSITION: Home  Discharge Instructions    Call MD for:  difficulty breathing, headache or visual disturbances    Complete by:  As directed      Call MD for:  extreme fatigue    Complete by:  As directed      Call MD for:  hives    Complete by:  As directed      Call MD  for:  persistant dizziness or light-headedness    Complete by:  As directed      Call MD for:  persistant nausea and vomiting    Complete by:  As directed      Call MD for:  severe uncontrolled pain    Complete by:  As directed      Call MD for:  temperature >100.4    Complete by:  As directed      Diet - low sodium heart healthy    Complete by:  As directed      Discharge instructions    Complete by:  As directed   Please follow up with your PCP in 1 week. Please apply compression stockings as recommended before. Have your PCP check your blood work at follow up.  You were cared for by a hospitalist during your hospital stay. If you have any questions about your discharge medications or the care you received while you were in the hospital after you are discharged, you can call the unit and asked to speak with the hospitalist on call if the hospitalist that took care of you is not available. Once you are discharged, your primary care physician will  handle any further medical issues. Please note that NO REFILLS for any discharge medications will be authorized once you are discharged, as it is imperative that you return to your primary care physician (or establish a relationship with a primary care physician if you do not have one) for your aftercare needs so that they can reassess your need for medications and monitor your lab values. If you do not have a primary care physician, you can call [phone] for a physician referral.     Increase activity slowly    Complete by:  As directed            ALLERGIES:  Allergies  Allergen Reactions  . Ambien [Zolpidem Tartrate]     Amnesia and fall   . Prevacid [Lansoprazole] Swelling  . Ace Inhibitors Cough  . Benadryl [Diphenhydramine Hcl]     topical  . Keflex [Cephalexin] Swelling    Swelling in ankles, feet  . Losartan     Elevated creatinine and swelling  . Motrin [Ibuprofen] Other (See Comments)    ANKLE EDEMA   . Prilosec [Omeprazole]      Swelling in ankles.     Current Discharge Medication List    START taking these medications   Details  levofloxacin (LEVAQUIN) 500 MG tablet Take 1 tablet (500 mg total) by mouth daily. For 10 days. Qty: 10 tablet, Refills: 0    oxyCODONE (OXY IR/ROXICODONE) 5 MG immediate release tablet Take 1 tablet (5 mg total) by mouth every 4 (four) hours as needed for severe pain. Qty: 15 tablet, Refills: 0      CONTINUE these medications which have CHANGED   Details  doxycycline (VIBRAMYCIN) 100 MG capsule Take 1 capsule (100 mg total) by mouth 2 (two) times daily. Patient to complete her medications that she has left at home and take for 8 more days after that. Qty: 16 capsule, Refills: 0      CONTINUE these medications which have NOT CHANGED   Details  allopurinol (ZYLOPRIM) 100 MG tablet TAKE 2 TABLETS EVERY DAY Qty: 180 tablet, Refills: 2    atenolol (TENORMIN) 50 MG tablet Take 1 tablet by mouth in the morning and 1/2 tablet by mouth in the evening Qty: 135 tablet, Refills: 3   Associated Diagnoses: Bilateral edema of lower extremity; Abnormal LFTs; Chronic anticoagulation; Chronic atrial fibrillation ([place]); Dyspnea; Essential hypertension    furosemide (LASIX) 40 MG tablet Take 40 mg by mouth daily for 3 days then take 1/2 tablet by mouth daily Qty: 90 tablet, Refills: 0    levothyroxine (SYNTHROID, LEVOTHROID) 75 MCG tablet Take 75 mcg by mouth daily before breakfast.    Omega-3 Fatty Acids (FISH OIL) 1000 MG CAPS Take 1,000 mg by mouth daily.   Associated Diagnoses: Bilateral edema of lower extremity; Abnormal LFTs; Chronic anticoagulation; Chronic atrial fibrillation ([place]); Dyspnea; Essential hypertension    potassium chloride (K-DUR,KLOR-CON) 10 MEQ tablet Take 10 mEq by mouth every other day. Starting 07-05-15    rivaroxaban (XARELTO) 20 MG TABS tablet Take 1 tablet (20 mg total) by mouth daily with supper. Qty: 20 tablet, Refills: 0    Vitamin D, Ergocalciferol,  (DRISDOL) 50000 UNITS CAPS capsule Take 1 capsule (50,000 Units total) by mouth every 7 (seven) days. Qty: 30 capsule, Refills: 0    colchicine 0.6 MG tablet Take 0.6 mg by mouth as needed. For gout    diclofenac sodium (VOLTAREN) 1 % GEL Apply topically as needed (for arthritis).     simvastatin (ZOCOR) 20  MG tablet TAKE 1 TABLET BY MOUTH ONCE EVERY EVENING Qty: 90 tablet, Refills: 1    traMADol (ULTRAM) 50 MG tablet TAKE 1 TABLET BY MOUTH 3 TIMES A DAY AS NEEDED FOR PAIN Qty: 90 tablet, Refills: 0       Follow-up Information    Follow up with [name], MD. Schedule an appointment as soon as possible for a visit in 1 week.   Specialties:  Internal Medicine, Pediatrics   Why:  post hospitalization follow up   Contact information:   [place] [address] [phone]       TOTAL DISCHARGE TIME: 49 mins  [facility] Pager [phone]  07/25/2015, 11:30 AM

## 2015-08-03 ENCOUNTER — Encounter: Payer: Self-pay | Admitting: Internal Medicine

## 2015-08-03 ENCOUNTER — Ambulatory Visit (INDEPENDENT_AMBULATORY_CARE_PROVIDER_SITE_OTHER): Payer: PPO | Admitting: Internal Medicine

## 2015-08-03 VITALS — BP 100/60 | Temp 98.7°F | Wt 304.0 lb

## 2015-08-03 DIAGNOSIS — Z7901 Long term (current) use of anticoagulants: Secondary | ICD-10-CM | POA: Diagnosis not present

## 2015-08-03 DIAGNOSIS — R700 Elevated erythrocyte sedimentation rate: Secondary | ICD-10-CM

## 2015-08-03 DIAGNOSIS — R600 Localized edema: Secondary | ICD-10-CM | POA: Diagnosis not present

## 2015-08-03 DIAGNOSIS — E785 Hyperlipidemia, unspecified: Secondary | ICD-10-CM

## 2015-08-03 DIAGNOSIS — L03115 Cellulitis of right lower limb: Secondary | ICD-10-CM | POA: Diagnosis not present

## 2015-08-03 DIAGNOSIS — T887XXA Unspecified adverse effect of drug or medicament, initial encounter: Secondary | ICD-10-CM

## 2015-08-03 DIAGNOSIS — T50905A Adverse effect of unspecified drugs, medicaments and biological substances, initial encounter: Secondary | ICD-10-CM

## 2015-08-03 DIAGNOSIS — Z09 Encounter for follow-up examination after completed treatment for conditions other than malignant neoplasm: Secondary | ICD-10-CM

## 2015-08-03 LAB — CBC WITH DIFFERENTIAL/PLATELET
BASOS ABS: 0.1 10*3/uL (ref 0.0–0.1)
Basophils Relative: 0.7 % (ref 0.0–3.0)
EOS ABS: 0.2 10*3/uL (ref 0.0–0.7)
Eosinophils Relative: 2.8 % (ref 0.0–5.0)
HEMATOCRIT: 38.8 % (ref 36.0–46.0)
Hemoglobin: 12.5 g/dL (ref 12.0–15.0)
LYMPHS PCT: 12.3 % (ref 12.0–46.0)
Lymphs Abs: 0.9 10*3/uL (ref 0.7–4.0)
MCHC: 32.2 g/dL (ref 30.0–36.0)
MCV: 101.4 fl — ABNORMAL HIGH (ref 78.0–100.0)
Monocytes Absolute: 0.6 10*3/uL (ref 0.1–1.0)
Monocytes Relative: 8.4 % (ref 3.0–12.0)
NEUTROS ABS: 5.8 10*3/uL (ref 1.4–7.7)
NEUTROS PCT: 75.8 % (ref 43.0–77.0)
PLATELETS: 192 10*3/uL (ref 150.0–400.0)
RBC: 3.82 Mil/uL — ABNORMAL LOW (ref 3.87–5.11)
RDW: 15 % (ref 11.5–15.5)
WBC: 7.7 10*3/uL (ref 4.0–10.5)

## 2015-08-03 LAB — BASIC METABOLIC PANEL
BUN: 28 mg/dL — ABNORMAL HIGH (ref 6–23)
CALCIUM: 9.1 mg/dL (ref 8.4–10.5)
CO2: 30 meq/L (ref 19–32)
CREATININE: 1.14 mg/dL (ref 0.40–1.20)
Chloride: 103 mEq/L (ref 96–112)
GFR: 50.69 mL/min — AB (ref >60.00)
Glucose, Bld: 94 mg/dL (ref 70–99)
Potassium: 4.1 mEq/L (ref 3.5–5.1)
Sodium: 144 mEq/L (ref 135–145)

## 2015-08-03 NOTE — Patient Instructions (Signed)
The swelling on the left seems to be from fluid retention and leg down/ gravity.   Take lasix 40 mg per day for a week.     Elevated  Legs .  As possible RX for  compression stockings .Marland Kitchen  In the  meantime .   Will notify you  of labs when available. Then plan fu.   ROV in 1 month or so .
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
## 2015-08-09 ENCOUNTER — Other Ambulatory Visit: Payer: Self-pay | Admitting: Family Medicine

## 2015-08-11 NOTE — Telephone Encounter (Signed)
Ok to refill x 1  

## 2015-08-11 NOTE — Telephone Encounter (Signed)
Called to the pharmacy and left on machine.

## 2015-08-28 ENCOUNTER — Telehealth: Payer: Self-pay | Admitting: Internal Medicine

## 2015-08-28 ENCOUNTER — Encounter: Payer: Self-pay | Admitting: Family Medicine

## 2015-08-28 ENCOUNTER — Ambulatory Visit (INDEPENDENT_AMBULATORY_CARE_PROVIDER_SITE_OTHER): Payer: PPO | Admitting: Family Medicine

## 2015-08-28 VITALS — BP 122/80 | HR 85 | Temp 98.1°F | Wt 326.0 lb

## 2015-08-28 DIAGNOSIS — I5033 Acute on chronic diastolic (congestive) heart failure: Secondary | ICD-10-CM | POA: Diagnosis not present

## 2015-08-28 NOTE — Telephone Encounter (Signed)
[name] called saying she's had fluid in her feet and legs and she's now short of breath. She's wondering if the fluid has moved up to her chest. She thinks she needs a chest xray and is wondering if one can be ordered for her without her coming in. She wanted me to send a message instead of putting her on the schedule. She'd like a phone call regarding this.  Pt's ph# [phone] Thank you.

## 2015-08-28 NOTE — Telephone Encounter (Signed)
Pt callback and was reschedule to 11:15 am

## 2015-08-28 NOTE — Telephone Encounter (Signed)
[facility] - Client [place] [facility] Patient Name: [name] DOB: 11/11/1950 Initial Comment Caller States; shortness of breath, has appt this afternoon, but doesnt know if she should wait. Nurse Assessment Nurse: Germain Osgood RN, Opal Sidles Date/Time Eilene Ghazi Time): 08/28/2015 9:30:42 AM Confirm and document reason for call. If symptomatic, describe symptoms. ---Meghann states she has had fluid in leg and feet the past several months with hx cellulitis and Hx fluid in lungs Has developed increased shortness of breath and and is feeling very tired. Weight 304 - 305 Finished Doxycycline about 3 weeks Was not able to tolerate the Levaquin and did not finish. Unable to tolerate compression stockings Has the patient traveled out of the country within the last 30 days? ---No Does the patient have any new or worsening symptoms? ---Yes Will a triage be completed? ---Yes Related visit to physician within the last 2 weeks? ---No Does the PT have any chronic conditions? (i.e. diabetes, asthma, etc.) ---Yes List chronic conditions. ---Cellulitis Obesity Hx pneumonia. Hx High Blood pressure Is this a behavioral health or substance abuse call? ---No Guidelines Guideline Title Affirmed Question Affirmed Notes Breathing Difficulty [1] MILD difficulty breathing (e.g., minimal/no SOB at rest, SOB with walking, pulse <100) AND [2] NEW-onset or WORSE than normal Final Disposition User See Physician within 4 Hours (or PCP triage) Germain Osgood, RN, [name] Comments Caller reports she is on a Blood Thinner for a Fib No change in her heart rate noted. Has scheduled appointment for at at 4:00 PM and was advised no sooner appointment. Made aware of triage outcome and location of urgent care. States is across town and would prefer to not there Referrals GO TO FACILITY REFUSED Disagree/Comply: Comply

## 2015-08-28 NOTE — Telephone Encounter (Signed)
Appointment scheduled for 11:15 this morning with Dr. Yong Channel

## 2015-08-28 NOTE — Patient Instructions (Addendum)
Friday- take 80m of lasix and 10 meq potassium Saturday- take 426mof lasix in AM then repeat in 6 hours. Take 10 meq of potassium each time.  Sunday->Tuesday take 4022mf lasix and 10 meq of potassium  See Dr. PanRegis Bill Tuesday morning the 27th at 9:45 PM, please arrive 15 minutes early.   Seek care in the emergency room if symptoms worsen or are failing to improve

## 2015-08-28 NOTE — Telephone Encounter (Signed)
She cannot have chest xray without being seen. Please (1) Send patient to [facility] for proper assessment of symptoms or (2) schedule her appointment for today due to symptoms

## 2015-08-28 NOTE — Telephone Encounter (Signed)
Pt is scheduled at 4:00pm today with Dr. Yong Channel. Thank you.

## 2015-08-28 NOTE — Progress Notes (Signed)
Garret Reddish, MD  Subjective:  GENICE KIMBERLIN is a 64 y.o. year old very pleasant female patient who presents for/with See problem oriented charting ROS- denies chest pain. Endorses edema as well as shortness of breath. Endorses recent weight gain  Past Medical History- history of edema as well as LVL noted, prediabetes, morbid obesity, hypothyroidism, hypertension, hisotry of CVA, gout, history of dypnea, a fib, history of murmur  Medications- reviewed and updated Current Outpatient Prescriptions  Medication Sig Dispense Refill  . allopurinol (ZYLOPRIM) 100 MG tablet TAKE 2 TABLETS EVERY DAY 180 tablet 2  . atenolol (TENORMIN) 50 MG tablet Take 1 tablet by mouth in the morning and 1/2 tablet by mouth in the evening 135 tablet 3  . colchicine 0.6 MG tablet Take 0.6 mg by mouth as needed. For gout    . diclofenac sodium (VOLTAREN) 1 % GEL Apply topically as needed (for arthritis).     . furosemide (LASIX) 40 MG tablet Take 40 mg by mouth daily for 3 days then take 1/2 tablet by mouth daily 90 tablet 0  . levothyroxine (SYNTHROID, LEVOTHROID) 75 MCG tablet Take 75 mcg by mouth daily before breakfast.    . Omega-3 Fatty Acids (FISH OIL) 1000 MG CAPS Take 1,000 mg by mouth daily.    . potassium chloride (K-DUR,KLOR-CON) 10 MEQ tablet Take 10 mEq by mouth every other day. Starting 07-05-15    . rivaroxaban (XARELTO) 20 MG TABS tablet Take 1 tablet (20 mg total) by mouth daily with supper. 20 tablet 0  . Vitamin D, Ergocalciferol, (DRISDOL) 50000 UNITS CAPS capsule Take 1 capsule (50,000 Units total) by mouth every 7 (seven) days. 30 capsule 0  . oxyCODONE (OXY IR/ROXICODONE) 5 MG immediate release tablet Take 1 tablet (5 mg total) by mouth every 4 (four) hours as needed for severe pain. (Patient not taking: Reported on 08/03/2015) 15 tablet 0  . simvastatin (ZOCOR) 20 MG tablet TAKE 1 TABLET BY MOUTH ONCE EVERY EVENING (Patient not taking: Reported on 07/22/2015) 90 tablet 1  . traMADol (ULTRAM)  50 MG tablet TAKE 1 TABLET 3 TIMES A DAY AS NEEDED FOR PAIN (Patient not taking: Reported on 08/28/2015) 90 tablet 0  . [DISCONTINUED] TOPAMAX 50 MG tablet Take 50 mg by mouth.     No current facility-administered medications for this visit.    Objective: BP 122/80 mmHg  Pulse 85  Temp(Src) 98.1 F (36.7 C)  Wt   SpO2 90%  LMP 05/06/2013 Gen: NAD, resting comfortably in wheelchair, struggles to stand and reqiures assist CV: irregularly irregular no murmurs rubs or gallops Lungs: CTAB no crackles, wheeze, rhonchi, nonlabored breathing Abdomen: soft/nontender/nondistended/normal bowel sounds.  Ext: 2+ pitting pedal edema, legs wrapped and did not unwrap Skin: warm, dry, venous stasis changes Neuro: grossly normal, moves all extremities  Assessment/Plan:  Shortness of breath and edema S: Saw cardiology in October. Echo 06/17/15 Ef 55-60% mild tricuspid regurg. Moderate pulmonary hypertension. Could not assess diastolic- likely diastolic dysfunction. Increased lasix and potassium at that time. Patient states she has not been able to tolerate compression stockings. circaid wrap started yesterday and first day could get shoe on in 2-3 months. Patient is compliant with lasix 90m daily and potassium 154m every other day. Despite this over last month weight has increased significantly up over 20 lbs.  Wt Readings from Last 3 Encounters:  08/28/15 326 lb (147.873 kg)  08/03/15 304 lb (137.893 kg)  07/24/15 310 lb 10.1 oz (140.9 kg)  She states she has  had 2 days mild shortness of breath with activity. She has not had difficulty at rest. No chest pain. Has felt like this before when had pulmonary edema.  A/P: 64 year old female with  acute on chronic diastolic CHF exacerbation as evidenced by weight gain and edema. We will have her double lasix until she sees Dr. Regis Bill on Tuesday. Tomorrow, she will even take 2 doses of 71m (normally 249ma day)- would do this today but lat ein the day. We  considered CXR but discussed unlikely to change management. Her sats around 91% and we discussed if she has any worsening of SOB needs to go to the emergency room over holiday weekend. Also considered TSH, cbc, bnp and these could be done along with CXR if needed.    Emergent Return precautions advised.  Also see avs

## 2015-09-01 ENCOUNTER — Telehealth: Payer: Self-pay | Admitting: Family Medicine

## 2015-09-01 ENCOUNTER — Encounter: Payer: Self-pay | Admitting: Internal Medicine

## 2015-09-01 ENCOUNTER — Ambulatory Visit (INDEPENDENT_AMBULATORY_CARE_PROVIDER_SITE_OTHER): Payer: PPO | Admitting: Internal Medicine

## 2015-09-01 VITALS — BP 138/86 | HR 80 | Temp 98.2°F | Ht 62.0 in | Wt 319.1 lb

## 2015-09-01 DIAGNOSIS — R609 Edema, unspecified: Secondary | ICD-10-CM

## 2015-09-01 DIAGNOSIS — E538 Deficiency of other specified B group vitamins: Secondary | ICD-10-CM

## 2015-09-01 DIAGNOSIS — I482 Chronic atrial fibrillation, unspecified: Secondary | ICD-10-CM

## 2015-09-01 DIAGNOSIS — I10 Essential (primary) hypertension: Secondary | ICD-10-CM

## 2015-09-01 DIAGNOSIS — I5023 Acute on chronic systolic (congestive) heart failure: Secondary | ICD-10-CM | POA: Diagnosis not present

## 2015-09-01 DIAGNOSIS — Z79899 Other long term (current) drug therapy: Secondary | ICD-10-CM

## 2015-09-01 DIAGNOSIS — Z7901 Long term (current) use of anticoagulants: Secondary | ICD-10-CM

## 2015-09-01 LAB — BASIC METABOLIC PANEL
BUN: 22 mg/dL (ref 6–23)
CALCIUM: 9.1 mg/dL (ref 8.4–10.5)
CO2: 32 meq/L (ref 19–32)
CREATININE: 0.97 mg/dL (ref 0.40–1.20)
Chloride: 105 mEq/L (ref 96–112)
GFR: 61.06 mL/min (ref >60.00)
Glucose, Bld: 95 mg/dL (ref 70–99)
Potassium: 3.8 mEq/L (ref 3.5–5.1)
Sodium: 147 mEq/L — ABNORMAL HIGH (ref 135–145)

## 2015-09-01 LAB — LIPID PANEL
CHOL/HDL RATIO: 3
Cholesterol: 165 mg/dL (ref 0–200)
HDL: 52.1 mg/dL (ref >39.00)
LDL Cholesterol: 90 mg/dL (ref 0–99)
NONHDL: 112.97
TRIGLYCERIDES: 114 mg/dL (ref 0.0–149.0)
VLDL: 22.8 mg/dL (ref 0.0–40.0)

## 2015-09-01 LAB — BRAIN NATRIURETIC PEPTIDE: PRO B NATRI PEPTIDE: 583 pg/mL — AB (ref 0.0–100.0)

## 2015-09-01 LAB — MAGNESIUM: MAGNESIUM: 1.5 mg/dL (ref 1.5–2.5)

## 2015-09-01 MED ORDER — CYANOCOBALAMIN 1000 MCG/ML IJ SOLN
1000.0000 ug | Freq: Once | INTRAMUSCULAR | Status: AC
  Administered 2015-09-01: 1000 ug via INTRAMUSCULAR

## 2015-09-01 NOTE — Patient Instructions (Signed)
Stay on the  Lasix 40 mg  .  Each day and potassium  Will notify you  of labs when available. To include lipids potassium and  Magnesium .  Bnp. Will work on getting you to see cardiology soon.  Contact us if ... Gaining  More than 2  pounds in 1-2 days   In the interim.

## 2015-09-01 NOTE — Telephone Encounter (Signed)
I sent a message to Dr Gwenlyn Found... But can you Or someone else ... Call over to get An appt in the next  10 - 14 days        Thanks   WP     Pt has made an appt for 09/08/15
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
## 2015-09-02 ENCOUNTER — Encounter: Payer: Self-pay | Admitting: Internal Medicine

## 2015-09-08 ENCOUNTER — Encounter: Payer: Self-pay | Admitting: Cardiovascular Disease

## 2015-09-08 ENCOUNTER — Encounter: Payer: Self-pay | Admitting: Internal Medicine

## 2015-09-08 ENCOUNTER — Ambulatory Visit (INDEPENDENT_AMBULATORY_CARE_PROVIDER_SITE_OTHER): Payer: PPO | Admitting: Cardiovascular Disease

## 2015-09-08 VITALS — BP 134/76 | HR 84

## 2015-09-08 DIAGNOSIS — R600 Localized edema: Secondary | ICD-10-CM | POA: Insufficient documentation

## 2015-09-08 DIAGNOSIS — R0602 Shortness of breath: Secondary | ICD-10-CM

## 2015-09-08 DIAGNOSIS — I482 Chronic atrial fibrillation, unspecified: Secondary | ICD-10-CM

## 2015-09-08 DIAGNOSIS — I10 Essential (primary) hypertension: Secondary | ICD-10-CM

## 2015-09-08 NOTE — Assessment & Plan Note (Signed)
Mrs. Avera returns today in follow-up of bilateral lower extremity edema which has been significant over the last several months beginning back in September. At that time she acquired a viral illness which lasted for quite some time. A 2-D echo performed 06/17/15 revealed normal LV systolic function with intermediate diastolic dysfunction and normal valvular function. She does not eat salt. Recently she's had increasing dyspnea on exertion. Diuretics have been doubled which has resulted in some diuresis although she continues to be markedly dyspneic with 3+ pitting edema. I'm going to repeat a 2-D echo, continue her current Lasix and potassium dose. We'll recheck a basic metabolic panel panel and BNP in 2 weeks and have her see Suanne Marker Barrett Back in 3 weeks. I will see her back in 6-8 weeks.

## 2015-09-08 NOTE — Patient Instructions (Signed)
Medication Instructions:  Your physician recommends that you continue on your current medications as directed. Please refer to the Current Medication list given to you today.   Labwork: Your physician recommends that you return for lab work in: [place] - BNP/BMET The lab can be found on the FIRST FLOOR of out building in [address]   Testing/Procedures: Your physician has requested that you have an echocardiogram. Echocardiography is a painless test that uses sound waves to create images of your heart. It provides your doctor with information about the size and shape of your heart and how well your heart's chambers and valves are working. This procedure takes approximately one hour. There are no restrictions for this procedure.   Follow-Up: Your physician recommends that you schedule a follow-up appointment in: [place], [place]  Your physician recommends that you schedule a follow-up appointment in: 6-8 WEEKS WITH DR Gwenlyn Found   Any Other Special Instructions Will Be Listed Below (If Applicable).     If you need a refill on your cardiac medications before your next appointment, please call your pharmacy.

## 2015-09-08 NOTE — Assessment & Plan Note (Signed)
Chronic A. Fib rate controlled on beta blocker and Xarelto  oral anticoagulation

## 2015-09-08 NOTE — Assessment & Plan Note (Signed)
History of hypertension with blood pressure measured 134/76. Her hydrochlorothiazide and ACE inhibitor were discontinued. She is on a beta blocker. Continue current meds at current dosing

## 2015-09-08 NOTE — Progress Notes (Signed)
09/08/2015 KHORI UNDERBERG   [birth year]  [id number]  Primary Physician [name], MD Primary Cardiologist: [name] MD [name]   HPI:   The patient is a 65 year old, morbidly overweight, divorced Caucasian female, mother of 61, grandmother to 2 grandchildren who I last saw in the office 12/17/14. She is accompanied by her brother body and sister [name] today. She has a history of hypertension and hyperlipidemia. She has had a stroke in the past and has paroxysmal A-fib now in chronic A-fib on Xarelto anticoagulation. She is rate controlled. She has had an uvulopalatopharyngoplasty which improved her symptoms of sleep apnea. Dr. [name] follows her lipid profile and apparently recently stopped her Zocor. A 2D echo showed normal LV function with mild left atrial enlargement and a Myoview stress test performed December 08, 2010, was low risk.  She developed a "viral illness" back in September and subsequent to that has had progressive dyspnea on exertion and lower extremity edema. She was seen by one of our mid-level providers in October and 2-D echo was normal. Her diuretics were doubled. She was admitted with cellulitis in November. Arterial and venous Dopplers were normal. She continues to have severe dyspnea on exertion and 3+ pitting edema although she missed a increasing diuresis since her Lasix was doubled.   Current Outpatient Prescriptions  Medication Sig Dispense Refill  . allopurinol (ZYLOPRIM) 100 MG tablet TAKE 2 TABLETS EVERY DAY 180 tablet 2  . atenolol (TENORMIN) 50 MG tablet Take 1 tablet by mouth in the morning and 1/2 tablet by mouth in the evening 135 tablet 3  . colchicine 0.6 MG tablet Take 0.6 mg by mouth as needed. For gout    . diclofenac sodium (VOLTAREN) 1 % GEL Apply topically as needed (for arthritis).     . furosemide (LASIX) 40 MG tablet Take 40 mg by mouth daily for 3 days then take 1/2 tablet by mouth daily (Patient taking  differently: Take 40 mg by mouth daily. ) 90 tablet 0  . levothyroxine (SYNTHROID, LEVOTHROID) 75 MCG tablet Take 75 mcg by mouth daily before breakfast.    . Omega-3 Fatty Acids (FISH OIL) 1000 MG CAPS Take 1,000 mg by mouth daily.    Marland Kitchen oxyCODONE (OXY IR/ROXICODONE) 5 MG immediate release tablet Take 1 tablet (5 mg total) by mouth every 4 (four) hours as needed for severe pain. 15 tablet 0  . potassium chloride (K-DUR,KLOR-CON) 10 MEQ tablet Take 10 mEq by mouth daily. Taking with Lasix    . rivaroxaban (XARELTO) 20 MG TABS tablet Take 1 tablet (20 mg total) by mouth daily with supper. 20 tablet 0  . simvastatin (ZOCOR) 20 MG tablet TAKE 1 TABLET BY MOUTH ONCE EVERY EVENING 90 tablet 1  . traMADol (ULTRAM) 50 MG tablet TAKE 1 TABLET 3 TIMES A DAY AS NEEDED FOR PAIN 90 tablet 0  . Vitamin D, Ergocalciferol, (DRISDOL) 50000 UNITS CAPS capsule Take 1 capsule (50,000 Units total) by mouth every 7 (seven) days. 30 capsule 0  . [DISCONTINUED] TOPAMAX 50 MG tablet Take 50 mg by mouth.     No current facility-administered medications for this visit.    Allergies  Allergen Reactions  . Ambien [Zolpidem Tartrate]     Amnesia and fall   . Prevacid [Lansoprazole] Swelling  . Ace Inhibitors Cough  . Benadryl [Diphenhydramine Hcl]     topical  . Keflex [Cephalexin] Swelling    Swelling in ankles, feet  . Losartan  Elevated creatinine and swelling  . Motrin [Ibuprofen] Other (See Comments)    ANKLE EDEMA   . Prilosec [Omeprazole]     Swelling in ankles.    Social History   Social History  . Marital Status: Divorced    Spouse Name: N/A  . Number of Children: N/A  . Years of Education: N/A   Occupational History  . Not on file.   Social History Main Topics  . Smoking status: Never Smoker   . Smokeless tobacco: Never Used  . Alcohol Use: 0.0 oz/week     Comment: occ  . Drug Use: No  . Sexual Activity: No   Other Topics Concern  . 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  Cardiovascular: negative for chest pain, dyspnea on exertion, edema, orthopnea, palpitations, paroxysmal nocturnal dyspnea or shortness of breath Dermatological: negative for rash Respiratory: negative for cough or wheezing Urologic: negative for hematuria Abdominal: negative for nausea, vomiting, diarrhea, bright red blood per rectum, melena, or hematemesis Neurologic: negative for visual changes, syncope, or dizziness All other systems reviewed and are otherwise negative except as noted above.    Blood pressure 134/76, pulse 84, last menstrual period 05/06/2013.  General appearance: alert and no distress Neck: no adenopathy, no carotid bruit, no JVD, supple, symmetrical, trachea midline and thyroid not enlarged, symmetric, no tenderness/mass/nodules Lungs: clear to auscultation bilaterally Heart: irregularly irregular rhythm Extremities: 3+ pitting edema bilaterally  EKG not performed today  ASSESSMENT AND PLAN:   Bilateral lower extremity edema Mrs. Degrave returns today in follow-up of bilateral lower extremity edema which has been significant over the last several months beginning back in September. At that time she acquired a viral illness which lasted for quite some time. A 2-D echo performed 06/17/15 revealed normal LV systolic function with intermediate diastolic dysfunction and normal valvular function. She does not eat salt. Recently she's had increasing dyspnea on exertion. Diuretics have been doubled which has resulted in some diuresis although she continues to be markedly dyspneic with 3+ pitting edema. I'm going to repeat a 2-D echo, continue her current Lasix and potassium dose. We'll recheck a basic metabolic panel panel and BNP in 2 weeks and have her see Suanne Marker Barrett Back in  3 weeks. I will see her back in 6-8 weeks.  Chronic atrial fibrillation ([facility]) Chronic A. Fib rate controlled on beta blocker and Xarelto  oral anticoagulation  Hypertension History of hypertension with blood pressure measured 134/76. Her hydrochlorothiazide and ACE inhibitor were discontinued. She is on a beta blocker. Continue current meds at current dosing  HYPERLIPIDEMIA History of hyperlipidemia currently not on a statin drug because of proximal muscle weakness with recent lipid profile performed 09/01/15 revealing total cholesterol 165, LDL 90 and HDL of 52.      [name] MD FACP,FACC,FAHA, [facility] 09/08/2015 9:41 AM

## 2015-09-08 NOTE — Assessment & Plan Note (Signed)
History of hyperlipidemia currently not on a statin drug because of proximal muscle weakness with recent lipid profile performed 09/01/15 revealing total cholesterol 165, LDL 90 and HDL of 52.
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
## 2015-09-15 ENCOUNTER — Telehealth: Payer: Self-pay | Admitting: Internal Medicine

## 2015-09-15 DIAGNOSIS — Z7901 Long term (current) use of anticoagulants: Secondary | ICD-10-CM | POA: Diagnosis not present

## 2015-09-15 DIAGNOSIS — I110 Hypertensive heart disease with heart failure: Secondary | ICD-10-CM | POA: Diagnosis not present

## 2015-09-15 DIAGNOSIS — I5022 Chronic systolic (congestive) heart failure: Secondary | ICD-10-CM | POA: Diagnosis not present

## 2015-09-15 DIAGNOSIS — Z79891 Long term (current) use of opiate analgesic: Secondary | ICD-10-CM | POA: Diagnosis not present

## 2015-09-15 DIAGNOSIS — M549 Dorsalgia, unspecified: Secondary | ICD-10-CM | POA: Diagnosis not present

## 2015-09-15 DIAGNOSIS — I482 Chronic atrial fibrillation: Secondary | ICD-10-CM | POA: Diagnosis not present

## 2015-09-15 DIAGNOSIS — Z9181 History of falling: Secondary | ICD-10-CM | POA: Diagnosis not present

## 2015-09-15 DIAGNOSIS — M1990 Unspecified osteoarthritis, unspecified site: Secondary | ICD-10-CM | POA: Diagnosis not present

## 2015-09-15 DIAGNOSIS — R269 Unspecified abnormalities of gait and mobility: Secondary | ICD-10-CM | POA: Diagnosis not present

## 2015-09-15 NOTE — Telephone Encounter (Signed)
FYI [name] is calling to let md know they will start physical therapy today unable to  Start yesterday due to weather

## 2015-09-15 NOTE — Telephone Encounter (Signed)
Noted.

## 2015-09-16 ENCOUNTER — Telehealth: Payer: Self-pay | Admitting: Internal Medicine

## 2015-09-16 ENCOUNTER — Other Ambulatory Visit: Payer: PPO

## 2015-09-16 ENCOUNTER — Other Ambulatory Visit (HOSPITAL_COMMUNITY): Payer: PPO

## 2015-09-16 NOTE — Telephone Encounter (Signed)
Spoke to [place] and advised to proceed with pt.

## 2015-09-16 NOTE — Telephone Encounter (Signed)
Beverly from [facility] call to ask for verbal orders pt for 2 times a week for 4 weeks  [phone]

## 2015-09-16 NOTE — Telephone Encounter (Signed)
Ok.

## 2015-09-19 DIAGNOSIS — M47816 Spondylosis without myelopathy or radiculopathy, lumbar region: Secondary | ICD-10-CM | POA: Diagnosis not present

## 2015-09-19 DIAGNOSIS — M5136 Other intervertebral disc degeneration, lumbar region: Secondary | ICD-10-CM | POA: Diagnosis not present

## 2015-09-22 ENCOUNTER — Other Ambulatory Visit: Payer: Self-pay

## 2015-09-22 ENCOUNTER — Ambulatory Visit (HOSPITAL_COMMUNITY): Payer: PPO | Attending: Cardiology

## 2015-09-22 ENCOUNTER — Other Ambulatory Visit (INDEPENDENT_AMBULATORY_CARE_PROVIDER_SITE_OTHER): Payer: PPO | Admitting: *Deleted

## 2015-09-22 DIAGNOSIS — I059 Rheumatic mitral valve disease, unspecified: Secondary | ICD-10-CM | POA: Diagnosis not present

## 2015-09-22 DIAGNOSIS — R0602 Shortness of breath: Secondary | ICD-10-CM | POA: Insufficient documentation

## 2015-09-22 DIAGNOSIS — I482 Chronic atrial fibrillation, unspecified: Secondary | ICD-10-CM

## 2015-09-22 DIAGNOSIS — I340 Nonrheumatic mitral (valve) insufficiency: Secondary | ICD-10-CM | POA: Diagnosis not present

## 2015-09-22 DIAGNOSIS — R0600 Dyspnea, unspecified: Secondary | ICD-10-CM | POA: Diagnosis not present

## 2015-09-22 DIAGNOSIS — I10 Essential (primary) hypertension: Secondary | ICD-10-CM

## 2015-09-22 DIAGNOSIS — R600 Localized edema: Secondary | ICD-10-CM | POA: Diagnosis not present

## 2015-09-22 DIAGNOSIS — I517 Cardiomegaly: Secondary | ICD-10-CM | POA: Insufficient documentation

## 2015-09-22 LAB — BASIC METABOLIC PANEL
BUN: 17 mg/dL (ref 7–25)
CALCIUM: 9.2 mg/dL (ref 8.6–10.4)
CO2: 30 mmol/L (ref 20–31)
Chloride: 101 mmol/L (ref 98–110)
Creat: 0.93 mg/dL (ref 0.50–0.99)
GLUCOSE: 101 mg/dL — AB (ref 65–99)
POTASSIUM: 4 mmol/L (ref 3.5–5.3)
SODIUM: 142 mmol/L (ref 135–146)

## 2015-09-22 LAB — BRAIN NATRIURETIC PEPTIDE: Brain Natriuretic Peptide: 348.6 pg/mL — ABNORMAL HIGH (ref 0.0–100.0)

## 2015-09-23 ENCOUNTER — Other Ambulatory Visit: Payer: PPO

## 2015-09-24 DIAGNOSIS — Z9181 History of falling: Secondary | ICD-10-CM | POA: Diagnosis not present

## 2015-09-24 DIAGNOSIS — R269 Unspecified abnormalities of gait and mobility: Secondary | ICD-10-CM | POA: Diagnosis not present

## 2015-09-24 DIAGNOSIS — I5022 Chronic systolic (congestive) heart failure: Secondary | ICD-10-CM | POA: Diagnosis not present

## 2015-09-24 DIAGNOSIS — Z79891 Long term (current) use of opiate analgesic: Secondary | ICD-10-CM | POA: Diagnosis not present

## 2015-09-24 DIAGNOSIS — M1990 Unspecified osteoarthritis, unspecified site: Secondary | ICD-10-CM | POA: Diagnosis not present

## 2015-09-24 DIAGNOSIS — I482 Chronic atrial fibrillation: Secondary | ICD-10-CM | POA: Diagnosis not present

## 2015-09-24 DIAGNOSIS — Z7901 Long term (current) use of anticoagulants: Secondary | ICD-10-CM | POA: Diagnosis not present

## 2015-09-24 DIAGNOSIS — I110 Hypertensive heart disease with heart failure: Secondary | ICD-10-CM | POA: Diagnosis not present

## 2015-09-24 DIAGNOSIS — M549 Dorsalgia, unspecified: Secondary | ICD-10-CM | POA: Diagnosis not present

## 2015-09-28 DIAGNOSIS — I5022 Chronic systolic (congestive) heart failure: Secondary | ICD-10-CM | POA: Diagnosis not present

## 2015-09-28 DIAGNOSIS — Z9181 History of falling: Secondary | ICD-10-CM | POA: Diagnosis not present

## 2015-09-28 DIAGNOSIS — Z79891 Long term (current) use of opiate analgesic: Secondary | ICD-10-CM | POA: Diagnosis not present

## 2015-09-28 DIAGNOSIS — M1990 Unspecified osteoarthritis, unspecified site: Secondary | ICD-10-CM | POA: Diagnosis not present

## 2015-09-28 DIAGNOSIS — R269 Unspecified abnormalities of gait and mobility: Secondary | ICD-10-CM | POA: Diagnosis not present

## 2015-09-28 DIAGNOSIS — I482 Chronic atrial fibrillation: Secondary | ICD-10-CM | POA: Diagnosis not present

## 2015-09-28 DIAGNOSIS — M549 Dorsalgia, unspecified: Secondary | ICD-10-CM | POA: Diagnosis not present

## 2015-09-28 DIAGNOSIS — I110 Hypertensive heart disease with heart failure: Secondary | ICD-10-CM | POA: Diagnosis not present

## 2015-09-28 DIAGNOSIS — Z7901 Long term (current) use of anticoagulants: Secondary | ICD-10-CM | POA: Diagnosis not present

## 2015-09-29 ENCOUNTER — Ambulatory Visit (INDEPENDENT_AMBULATORY_CARE_PROVIDER_SITE_OTHER): Payer: PPO | Admitting: Physician Assistant

## 2015-09-29 ENCOUNTER — Encounter: Payer: Self-pay | Admitting: Physician Assistant

## 2015-09-29 VITALS — BP 124/70 | HR 86 | Ht 62.0 in | Wt 293.0 lb

## 2015-09-29 DIAGNOSIS — Z79899 Other long term (current) drug therapy: Secondary | ICD-10-CM | POA: Diagnosis not present

## 2015-09-29 DIAGNOSIS — I5032 Chronic diastolic (congestive) heart failure: Secondary | ICD-10-CM | POA: Diagnosis not present

## 2015-09-29 DIAGNOSIS — I10 Essential (primary) hypertension: Secondary | ICD-10-CM | POA: Diagnosis not present

## 2015-09-29 LAB — BASIC METABOLIC PANEL
BUN: 19 mg/dL (ref 7–25)
CHLORIDE: 103 mmol/L (ref 98–110)
CO2: 30 mmol/L (ref 20–31)
CREATININE: 0.9 mg/dL (ref 0.50–0.99)
Calcium: 9.2 mg/dL (ref 8.6–10.4)
GLUCOSE: 98 mg/dL (ref 65–99)
POTASSIUM: 4 mmol/L (ref 3.5–5.3)
Sodium: 144 mmol/L (ref 135–146)

## 2015-09-29 MED ORDER — FUROSEMIDE 40 MG PO TABS: ORAL_TABLET | ORAL | Status: DC
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
## 2015-09-29 NOTE — Patient Instructions (Signed)
Your physician recommends that you return for lab work in: [place] has recommended you make the following change in your medication: ONLY TAKE FUROSEMIDE ONE 40 MG [place] [facility] DR. Gwenlyn Found 10/27/15 @ 8:30AM.

## 2015-09-30 ENCOUNTER — Other Ambulatory Visit: Payer: PPO

## 2015-10-05 DIAGNOSIS — I5022 Chronic systolic (congestive) heart failure: Secondary | ICD-10-CM | POA: Diagnosis not present

## 2015-10-05 DIAGNOSIS — Z9181 History of falling: Secondary | ICD-10-CM | POA: Diagnosis not present

## 2015-10-05 DIAGNOSIS — Z7901 Long term (current) use of anticoagulants: Secondary | ICD-10-CM | POA: Diagnosis not present

## 2015-10-05 DIAGNOSIS — M1990 Unspecified osteoarthritis, unspecified site: Secondary | ICD-10-CM | POA: Diagnosis not present

## 2015-10-05 DIAGNOSIS — M549 Dorsalgia, unspecified: Secondary | ICD-10-CM | POA: Diagnosis not present

## 2015-10-05 DIAGNOSIS — I110 Hypertensive heart disease with heart failure: Secondary | ICD-10-CM | POA: Diagnosis not present

## 2015-10-05 DIAGNOSIS — Z79891 Long term (current) use of opiate analgesic: Secondary | ICD-10-CM | POA: Diagnosis not present

## 2015-10-05 DIAGNOSIS — R269 Unspecified abnormalities of gait and mobility: Secondary | ICD-10-CM | POA: Diagnosis not present

## 2015-10-05 DIAGNOSIS — I482 Chronic atrial fibrillation: Secondary | ICD-10-CM | POA: Diagnosis not present

## 2015-10-08 ENCOUNTER — Telehealth: Payer: Self-pay | Admitting: Internal Medicine

## 2015-10-08 NOTE — Telephone Encounter (Signed)
[name] from [place] call to ask for verbal orders to continue pt 2 times per for 1 week.  Her insurance approve 9 visits and they have done 7 she missed one cause she was not feeling good   [phone]

## 2015-10-09 DIAGNOSIS — I5022 Chronic systolic (congestive) heart failure: Secondary | ICD-10-CM | POA: Diagnosis not present

## 2015-10-09 DIAGNOSIS — M1990 Unspecified osteoarthritis, unspecified site: Secondary | ICD-10-CM

## 2015-10-09 DIAGNOSIS — M549 Dorsalgia, unspecified: Secondary | ICD-10-CM | POA: Diagnosis not present

## 2015-10-09 DIAGNOSIS — I482 Chronic atrial fibrillation: Secondary | ICD-10-CM | POA: Diagnosis not present

## 2015-10-09 DIAGNOSIS — I110 Hypertensive heart disease with heart failure: Secondary | ICD-10-CM | POA: Diagnosis not present

## 2015-10-09 NOTE — Telephone Encounter (Signed)
Ok to order 

## 2015-10-09 NOTE — Telephone Encounter (Signed)
Left verbal authorization on identified number for 2 more visits per [facility]

## 2015-10-12 DIAGNOSIS — Z7901 Long term (current) use of anticoagulants: Secondary | ICD-10-CM | POA: Diagnosis not present

## 2015-10-12 DIAGNOSIS — I482 Chronic atrial fibrillation: Secondary | ICD-10-CM | POA: Diagnosis not present

## 2015-10-12 DIAGNOSIS — I5022 Chronic systolic (congestive) heart failure: Secondary | ICD-10-CM | POA: Diagnosis not present

## 2015-10-12 DIAGNOSIS — R269 Unspecified abnormalities of gait and mobility: Secondary | ICD-10-CM | POA: Diagnosis not present

## 2015-10-12 DIAGNOSIS — Z9181 History of falling: Secondary | ICD-10-CM | POA: Diagnosis not present

## 2015-10-12 DIAGNOSIS — M549 Dorsalgia, unspecified: Secondary | ICD-10-CM | POA: Diagnosis not present

## 2015-10-12 DIAGNOSIS — M1990 Unspecified osteoarthritis, unspecified site: Secondary | ICD-10-CM | POA: Diagnosis not present

## 2015-10-12 DIAGNOSIS — I110 Hypertensive heart disease with heart failure: Secondary | ICD-10-CM | POA: Diagnosis not present

## 2015-10-12 DIAGNOSIS — Z79891 Long term (current) use of opiate analgesic: Secondary | ICD-10-CM | POA: Diagnosis not present

## 2015-10-14 DIAGNOSIS — M19012 Primary osteoarthritis, left shoulder: Secondary | ICD-10-CM | POA: Diagnosis not present

## 2015-10-18 ENCOUNTER — Other Ambulatory Visit: Payer: Self-pay | Admitting: Cardiovascular Disease

## 2015-10-23 ENCOUNTER — Telehealth: Payer: Self-pay | Admitting: *Deleted

## 2015-10-23 NOTE — Telephone Encounter (Signed)
[facility] - [address]  [phone]  90 day requests for   Allopurinol 100 mg take 2 tabs every day Levothyroxine 75 mcg take one tab every day

## 2015-10-23 NOTE — Telephone Encounter (Signed)
Filled for 9 months in Sept 2016.

## 2015-10-27 ENCOUNTER — Encounter: Payer: Self-pay | Admitting: Cardiovascular Disease

## 2015-10-27 ENCOUNTER — Ambulatory Visit (INDEPENDENT_AMBULATORY_CARE_PROVIDER_SITE_OTHER): Payer: PPO | Admitting: Cardiovascular Disease

## 2015-10-27 VITALS — BP 122/88 | HR 81 | Ht 62.0 in | Wt 285.0 lb

## 2015-10-27 DIAGNOSIS — Z79899 Other long term (current) drug therapy: Secondary | ICD-10-CM

## 2015-10-27 DIAGNOSIS — I482 Chronic atrial fibrillation, unspecified: Secondary | ICD-10-CM

## 2015-10-27 DIAGNOSIS — I10 Essential (primary) hypertension: Secondary | ICD-10-CM

## 2015-10-27 DIAGNOSIS — R600 Localized edema: Secondary | ICD-10-CM | POA: Diagnosis not present

## 2015-10-27 NOTE — Patient Instructions (Signed)
Medication Instructions:  Your physician recommends that you continue on your current medications as directed. Please refer to the Current Medication list given to you today.   Labwork: none  Testing/Procedures: none  Follow-Up: Your physician wants you to follow-up in: 6 months with Dr. Gwenlyn Found. You will receive a reminder letter in the mail two months in advance. If you don't receive a letter, please call our office to schedule the follow-up appointment.   Any Other Special Instructions Will Be Listed Below (If Applicable).     If you need a refill on your cardiac medications before your next appointment, please call your pharmacy.

## 2015-10-27 NOTE — Assessment & Plan Note (Signed)
History of chronic A. Fib rate controlled on atenolol and Xarelto.

## 2015-10-27 NOTE — Assessment & Plan Note (Signed)
[name] returns for follow-up of dyspnea and lower extremity edema. I last saw her in the office 09/08/15 and she was seen soon thereafter by [name] PAC on 09/29/15. Several 2-D echoes have been normal. Her BNP has dropped from the 500 range down to the 300 range and she's lost close to 40 pounds. Her edema has most part resolved and her shortness of breath has improved as well. I'm not sure what caused her decompensation although at this point I think she is on the right dose of diuretics which I said she can adjust herself based on her activity and monitor her weight and edema. I will see her back in 6 months.

## 2015-10-27 NOTE — Assessment & Plan Note (Signed)
History of hypertension blood pressure measured 122/88. She is on atenolol. Continue current meds at current dosing

## 2015-10-27 NOTE — Assessment & Plan Note (Signed)
History of hyperlipidemia on simvastatin with recent lipid profile performed 09/01/15 revealing total cholesterol 165, LDL of 90 and HDL of 52

## 2015-10-27 NOTE — Progress Notes (Signed)
10/27/2015 [name]   03/07/1951  [id number]  Primary Physician [name], MD Primary Cardiologist: [name] MD [name]  . HPI:  The patient is a 65 year old, morbidly overweight, divorced Caucasian female, mother of 10, grandmother to 2 grandchildren who I last saw in the office 09/08/15. She is accompanied by her brother body and sister [name] today. She has a history of hypertension and hyperlipidemia. She has had a stroke in the past and has paroxysmal A-fib now in chronic A-fib on Xarelto anticoagulation. She is rate controlled. She has had an uvulopalatopharyngoplasty which improved her symptoms of sleep apnea. Dr. [name] follows her lipid profile and apparently recently stopped her Zocor. A 2D echo showed normal LV function with mild left atrial enlargement and a Myoview stress test performed December 08, 2010, was low risk. She developed a "viral illness" back in September and subsequent to that has had progressive dyspnea on exertion and lower extremity edema. She was seen by one of our mid-level providers in October and 2-D echo was normal. Her diuretics were doubled. She was admitted with cellulitis in November. Arterial and venous Dopplers were normal. She continues to have severe dyspnea on exertion and 3+ pitting edema although she missed a increasing diuresis since her Lasix was doubled. She saw [name] several weeks after my last office visit on 09/29/15 and was clearly improving. A subsequent 2-D echo was normal. Her weight has come down 40 pounds and her edema has resolved as has her dyspnea. Her BMP likewise has come down from 500-300.   Current Outpatient Prescriptions  Medication Sig Dispense Refill  . allopurinol (ZYLOPRIM) 100 MG tablet TAKE 2 TABLETS EVERY DAY 180 tablet 2  . atenolol (TENORMIN) 50 MG tablet Take 1 tablet by mouth in the morning and 1/2 tablet by mouth in the evening 135 tablet 3  . colchicine 0.6 MG tablet Take  0.6 mg by mouth as needed. For gout    . diclofenac sodium (VOLTAREN) 1 % GEL Apply topically as needed (for arthritis).     . furosemide (LASIX) 40 MG tablet TAKE ONE TABLET DAILY 90 tablet 0  . levothyroxine (SYNTHROID, LEVOTHROID) 75 MCG tablet Take 75 mcg by mouth daily before breakfast.    . Omega-3 Fatty Acids (FISH OIL) 1000 MG CAPS Take 1,000 mg by mouth daily.    Marland Kitchen oxyCODONE (OXY IR/ROXICODONE) 5 MG immediate release tablet Take 1 tablet (5 mg total) by mouth every 4 (four) hours as needed for severe pain. 15 tablet 0  . potassium chloride (K-DUR,KLOR-CON) 10 MEQ tablet Take 10 mEq by mouth daily. Taking with Lasix    . rivaroxaban (XARELTO) 20 MG TABS tablet Take 1 tablet (20 mg total) by mouth daily with supper. 20 tablet 0  . simvastatin (ZOCOR) 20 MG tablet TAKE 1 TABLET BY MOUTH ONCE EVERY EVENING 90 tablet 1  . traMADol (ULTRAM) 50 MG tablet TAKE 1 TABLET 3 TIMES A DAY AS NEEDED FOR PAIN 90 tablet 0  . Vitamin D, Ergocalciferol, (DRISDOL) 50000 UNITS CAPS capsule Take 1 capsule (50,000 Units total) by mouth every 7 (seven) days. 30 capsule 0  . XARELTO 20 MG TABS tablet TAKE 1 TABLET BY MOUTH EVERY DAY WITH SUPPER 30 tablet 5  . [DISCONTINUED] TOPAMAX 50 MG tablet Take 50 mg by mouth.     No current facility-administered medications for this visit.    Allergies  Allergen Reactions  . Ambien [Zolpidem Tartrate]  Amnesia and fall   . Prevacid [Lansoprazole] Swelling  . Ace Inhibitors Cough  . Benadryl [Diphenhydramine Hcl]     topical  . Keflex [Cephalexin] Swelling    Swelling in ankles, feet  . Losartan     Elevated creatinine and swelling  . Motrin [Ibuprofen] Other (See Comments)    ANKLE EDEMA   . Prilosec [Omeprazole]     Swelling in ankles.    Social History   Social History  . Marital Status: Divorced    Spouse Name: N/A  . Number of Children: N/A  . Years of Education: N/A   Occupational History  . Not on file.   Social History Main Topics  .  Smoking status: Never Smoker   . Smokeless tobacco: Never Used  . Alcohol Use: 0.0 oz/week    0 Standard drinks or equivalent per week     Comment: occ  . Drug Use: No  . Sexual Activity: No   Other Topics Concern  . 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  Cardiovascular: negative for chest pain, dyspnea on exertion, edema, orthopnea, palpitations, paroxysmal nocturnal dyspnea or shortness of breath Dermatological: negative for rash Respiratory: negative for cough or wheezing Urologic: negative for hematuria Abdominal: negative for nausea, vomiting, diarrhea, bright red blood per rectum, melena, or hematemesis Neurologic: negative for visual changes, syncope, or dizziness All other systems reviewed and are otherwise negative except as noted above.    Blood pressure 122/88, pulse 81, height _0  (1.575 m), weight 285 lb (129.275 kg), last menstrual period 05/06/2013.  General appearance: alert and no distress Neck: no adenopathy, no carotid bruit, no JVD, supple, symmetrical, trachea midline and thyroid not enlarged, symmetric, no tenderness/mass/nodules Lungs: clear to auscultation bilaterally Heart: irregularly irregular rhythm Extremities: 2+ pitting ankle edema  EKG atrial fibrillation with a ventricular response of 83 and occasional aberrantly conducted beat with septal Q waves. I personally reviewed this EKG  ASSESSMENT AND PLAN:   Bilateral lower extremity edema [name] returns for follow-up of dyspnea and lower extremity edema. I last saw her in the office 09/08/15 and she was seen soon thereafter by [name] PAC on 09/29/15. Several 2-D echoes have been normal. Her BNP has dropped from the 500 range down to the 300 range and she's lost close to 40 pounds. Her edema has  most part resolved and her shortness of breath has improved as well. I'm not sure what caused her decompensation although at this point I think she is on the right dose of diuretics which I said she can adjust herself based on her activity and monitor her weight and edema. I will see her back in 6 months.  HYPERLIPIDEMIA History of hyperlipidemia on simvastatin with recent lipid profile performed 09/01/15 revealing total cholesterol 165, LDL of 90 and HDL of 52  Hypertension History of hypertension blood pressure measured 122/88. She is on atenolol. Continue current meds at current dosing  Chronic atrial fibrillation (HCC) History of chronic A. Fib rate controlled on atenolol and Xarelto.       [name] MD FACP,FACC,FAHA, [facility] 10/27/2015 9:07 AM

## 2015-10-28 ENCOUNTER — Ambulatory Visit (INDEPENDENT_AMBULATORY_CARE_PROVIDER_SITE_OTHER): Payer: PPO | Admitting: Internal Medicine

## 2015-10-28 ENCOUNTER — Encounter: Payer: Self-pay | Admitting: Internal Medicine

## 2015-10-28 VITALS — BP 140/84 | HR 71 | Temp 98.5°F

## 2015-10-28 DIAGNOSIS — Z79899 Other long term (current) drug therapy: Secondary | ICD-10-CM | POA: Diagnosis not present

## 2015-10-28 DIAGNOSIS — E559 Vitamin D deficiency, unspecified: Secondary | ICD-10-CM | POA: Diagnosis not present

## 2015-10-28 DIAGNOSIS — I10 Essential (primary) hypertension: Secondary | ICD-10-CM

## 2015-10-28 DIAGNOSIS — D519 Vitamin B12 deficiency anemia, unspecified: Secondary | ICD-10-CM | POA: Diagnosis not present

## 2015-10-28 DIAGNOSIS — Z7901 Long term (current) use of anticoagulants: Secondary | ICD-10-CM

## 2015-10-28 MED ORDER — CYANOCOBALAMIN 1000 MCG/ML IJ SOLN
1000.0000 ug | Freq: Once | INTRAMUSCULAR | Status: AC
  Administered 2015-10-28: 1000 ug via INTRAMUSCULAR
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
## 2015-10-28 NOTE — Patient Instructions (Signed)
Take 1000 iu vit d [place]... not a lot of cost .  Plan check labs in 3 months   Vit D, bmp  and then see if other intervention needed.   Continue  With   Healthy weight loss.  Then ROV.

## 2015-11-25 ENCOUNTER — Ambulatory Visit (INDEPENDENT_AMBULATORY_CARE_PROVIDER_SITE_OTHER): Payer: PPO | Admitting: Family Medicine

## 2015-11-25 DIAGNOSIS — E538 Deficiency of other specified B group vitamins: Secondary | ICD-10-CM | POA: Diagnosis not present

## 2015-11-25 MED ORDER — CYANOCOBALAMIN 1000 MCG/ML IJ SOLN
1000.0000 ug | Freq: Once | INTRAMUSCULAR | Status: AC
  Administered 2015-11-25: 1000 ug via INTRAMUSCULAR

## 2015-12-15 DIAGNOSIS — M19012 Primary osteoarthritis, left shoulder: Secondary | ICD-10-CM | POA: Diagnosis not present

## 2015-12-22 ENCOUNTER — Other Ambulatory Visit: Payer: Self-pay | Admitting: Internal Medicine

## 2015-12-22 NOTE — Telephone Encounter (Signed)
Ok to refill x 1  

## 2015-12-23 NOTE — Telephone Encounter (Signed)
Called to the pharmacy and left on machine. 

## 2015-12-27 ENCOUNTER — Other Ambulatory Visit: Payer: Self-pay | Admitting: Internal Medicine

## 2015-12-28 NOTE — Telephone Encounter (Signed)
Duplicate request.  Called in on 12/23/15.

## 2015-12-29 ENCOUNTER — Telehealth: Payer: Self-pay

## 2015-12-29 NOTE — Telephone Encounter (Signed)
Since she's had a stroke she will probably need to be bridged

## 2015-12-29 NOTE — Telephone Encounter (Signed)
Requesting surgical clearance:   1. Type of surgery: Injection  2. Surgeon: [facility]  3. Surgical date: 01/08/2016  4. Medications that need to be held: Xarelto - HOLD 3 days before injection  5. CAD: No     6. I will defer to: [facility] [phone] Ext [phone] fax Attn: Estill Bamberg

## 2015-12-30 NOTE — Telephone Encounter (Signed)
[name] spoke with patient, she explained that this is for a spinal injection.  She has had several in the past and did not do any bridging for those procedures.  We will go ahead and not bridge, as it would be at most only 1-2 injections an there are no current guidelines to bridge DOAC.  Patient aware.

## 2015-12-30 NOTE — Telephone Encounter (Signed)
Clearance faxed to [facility] Attn: Estill Bamberg

## 2016-01-05 ENCOUNTER — Encounter: Payer: Self-pay | Admitting: Internal Medicine

## 2016-01-05 IMAGING — CR DG FOOT COMPLETE 3+V*R*
3 series · 3 of 3 positions shown · non-contrast
Comparison: Right foot series of October 10, 2013

CLINICAL DATA: Right foot pain and swelling and erythema dorsally
without known trauma, clinically suspected cellulitis.

EXAM:
RIGHT FOOT COMPLETE - 3+ VIEW

[x foot ap right]
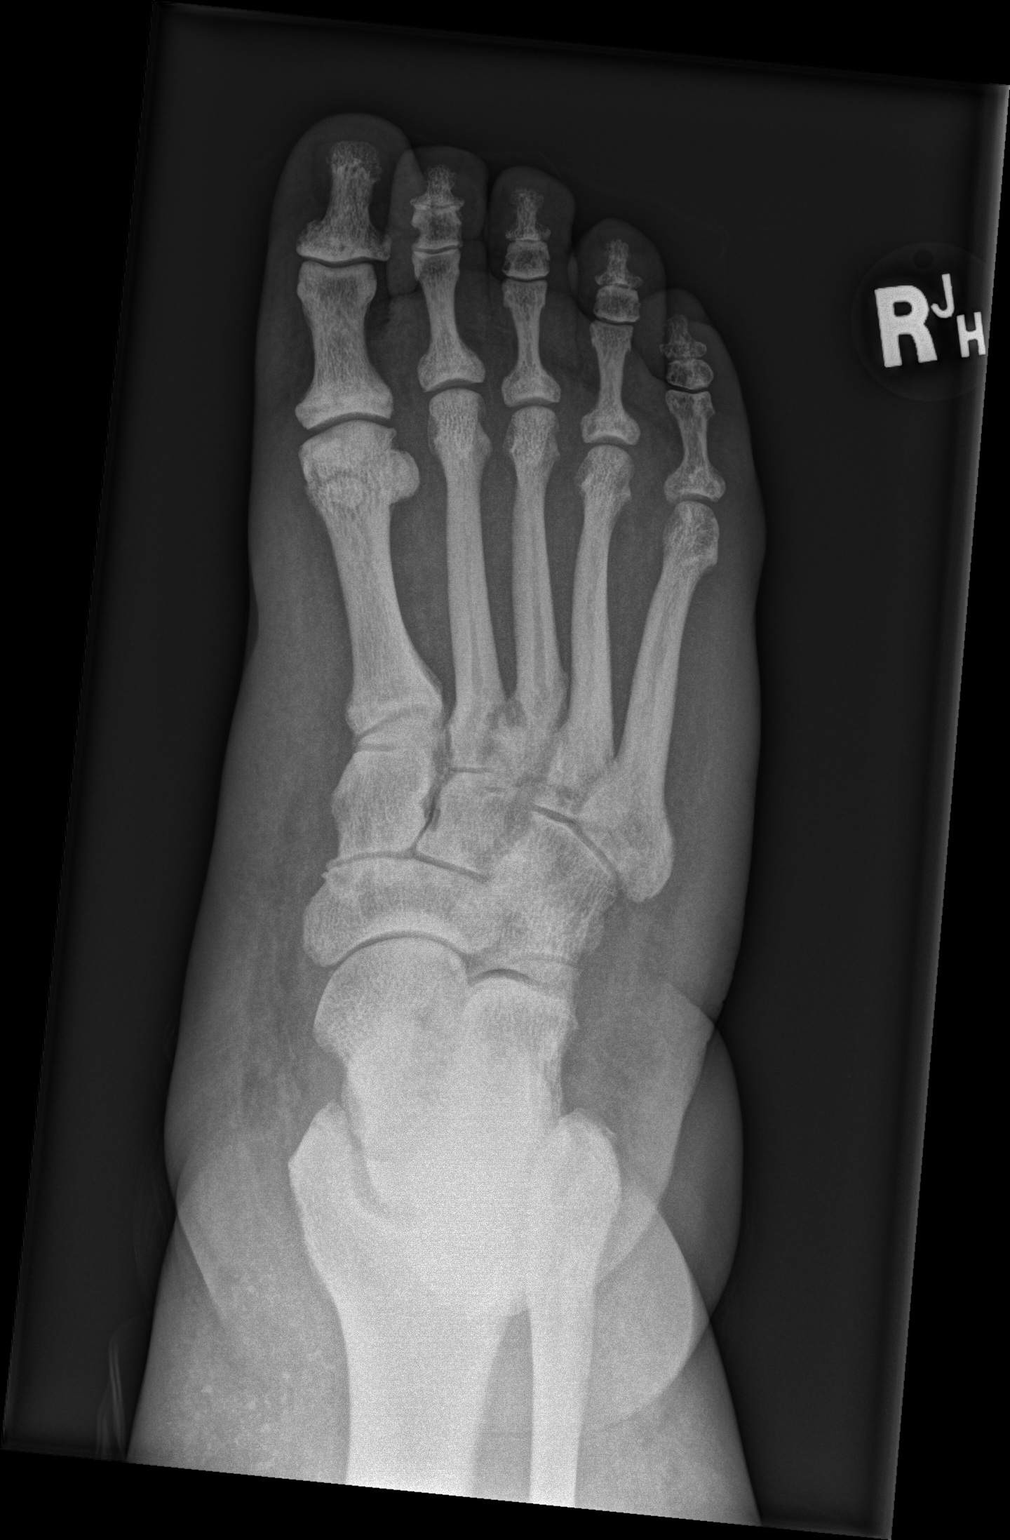

[x foot obl right]
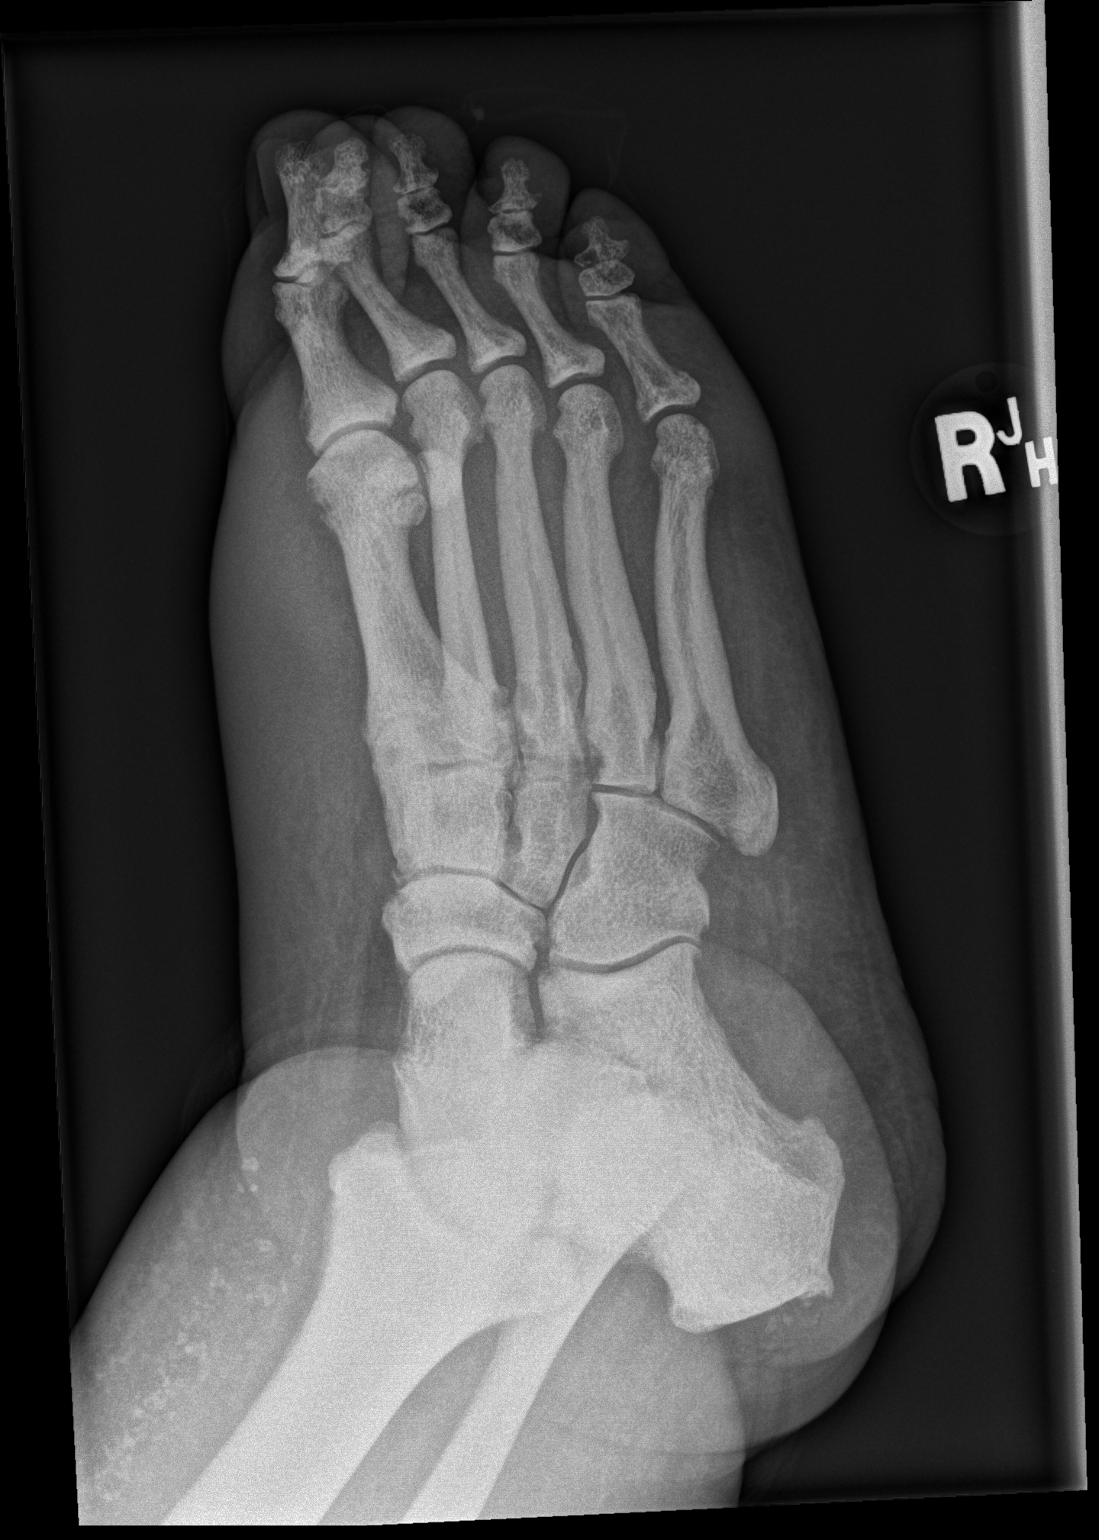

[x foot lat right]
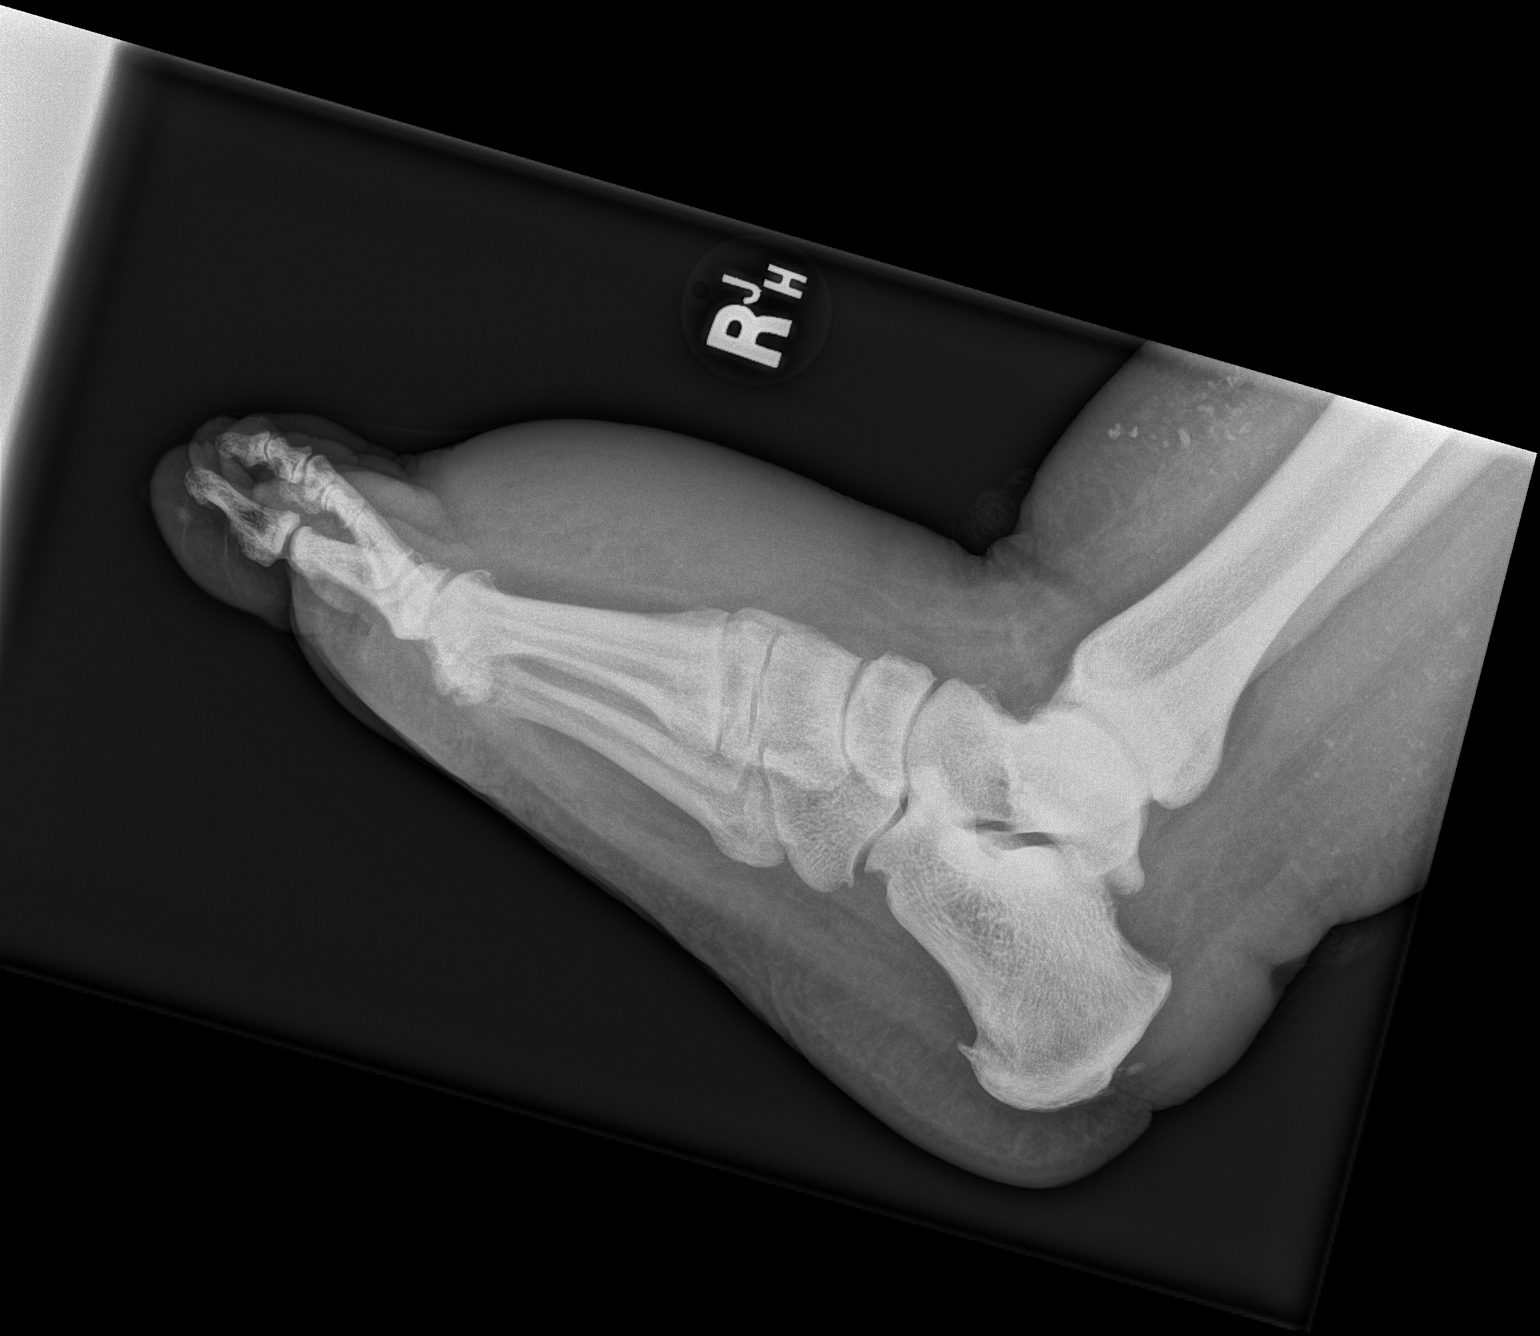

[3 of 3 positions shown; findings below may reference images not displayed]

FINDINGS: There is a large amount of soft tissue swelling over the dorsum of
the foot. No foreign bodies or gas collections are observed. Soft
tissue swelling over the lower leg and ankle is present as well.

The bones are adequately mineralized. There is no lytic nor blastic
lesion. There is mild narrowing of the DIP joints diffusely with
minimal narrowing of the PIP joints. There is no acute fracture nor
dislocation. No lytic nor blastic bony lesions are observed. The
bones of the hindfoot are intact. There are plantar and Achilles
region calcaneal spurs.
IMPRESSION: Significant soft tissue swelling over the lower leg, ankle, and
dorsum of the foot. This may reflect clinically suspected
cellulitis. No foreign body is observed. There is no radiographic
evidence of osteomyelitis.

## 2016-01-08 DIAGNOSIS — M5136 Other intervertebral disc degeneration, lumbar region: Secondary | ICD-10-CM | POA: Diagnosis not present

## 2016-01-08 DIAGNOSIS — M545 Low back pain: Secondary | ICD-10-CM | POA: Diagnosis not present

## 2016-01-18 ENCOUNTER — Other Ambulatory Visit: Payer: Self-pay | Admitting: Physician Assistant

## 2016-01-18 NOTE — Telephone Encounter (Signed)
Rx request sent to pharmacy.  

## 2016-01-25 ENCOUNTER — Other Ambulatory Visit (INDEPENDENT_AMBULATORY_CARE_PROVIDER_SITE_OTHER): Payer: PPO

## 2016-01-25 DIAGNOSIS — E119 Type 2 diabetes mellitus without complications: Secondary | ICD-10-CM

## 2016-01-25 DIAGNOSIS — E039 Hypothyroidism, unspecified: Secondary | ICD-10-CM

## 2016-01-25 DIAGNOSIS — E559 Vitamin D deficiency, unspecified: Secondary | ICD-10-CM | POA: Diagnosis not present

## 2016-01-25 DIAGNOSIS — I10 Essential (primary) hypertension: Secondary | ICD-10-CM | POA: Diagnosis not present

## 2016-01-25 LAB — BASIC METABOLIC PANEL
BUN: 21 mg/dL (ref 6–23)
CALCIUM: 9.1 mg/dL (ref 8.4–10.5)
CHLORIDE: 102 meq/L (ref 96–112)
CO2: 30 meq/L (ref 19–32)
CREATININE: 0.94 mg/dL (ref 0.40–1.20)
GFR: 63.24 mL/min (ref >60.00)
GLUCOSE: 92 mg/dL (ref 70–99)
Potassium: 3.8 mEq/L (ref 3.5–5.1)
SODIUM: 142 meq/L (ref 135–145)

## 2016-01-25 LAB — HEMOGLOBIN A1C: Hgb A1c MFr Bld: 6.1 % (ref 4.6–6.5)

## 2016-01-25 LAB — VITAMIN D 25 HYDROXY (VIT D DEFICIENCY, FRACTURES): VITD: 43.55 ng/mL (ref 30.00–100.00)

## 2016-01-25 LAB — TSH: TSH: 1.07 u[IU]/mL (ref 0.35–4.50)

## 2016-02-03 ENCOUNTER — Encounter: Payer: Self-pay | Admitting: Internal Medicine

## 2016-02-03 ENCOUNTER — Ambulatory Visit (INDEPENDENT_AMBULATORY_CARE_PROVIDER_SITE_OTHER): Payer: PPO | Admitting: Internal Medicine

## 2016-02-03 VITALS — BP 126/80 | HR 91 | Temp 98.3°F | Ht 62.0 in | Wt 284.5 lb

## 2016-02-03 DIAGNOSIS — Z79899 Other long term (current) drug therapy: Secondary | ICD-10-CM | POA: Diagnosis not present

## 2016-02-03 DIAGNOSIS — E559 Vitamin D deficiency, unspecified: Secondary | ICD-10-CM | POA: Diagnosis not present

## 2016-02-03 DIAGNOSIS — I10 Essential (primary) hypertension: Secondary | ICD-10-CM

## 2016-02-03 DIAGNOSIS — Z7901 Long term (current) use of anticoagulants: Secondary | ICD-10-CM

## 2016-02-03 DIAGNOSIS — E538 Deficiency of other specified B group vitamins: Secondary | ICD-10-CM | POA: Diagnosis not present

## 2016-02-03 DIAGNOSIS — R7301 Impaired fasting glucose: Secondary | ICD-10-CM

## 2016-02-03 MED ORDER — CYANOCOBALAMIN 1000 MCG/ML IJ SOLN
1000.0000 ug | Freq: Once | INTRAMUSCULAR | Status: AC
  Administered 2016-02-03: 1000 ug via INTRAMUSCULAR

## 2016-02-03 NOTE — Progress Notes (Signed)
Pre visit review using our clinic review tool, if applicable. No additional management support is needed unless otherwise documented below in the visit note.
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
## 2016-02-03 NOTE — Patient Instructions (Addendum)
Your  Labs are stable .   Continue Vit  D  as you are doing.  6 months preventive exam and med check with labs then .  You may make appointment  any time with our Health Coach nurse Wynetta Fines  for wellness visit  Provided covered by  Medicare yearly .

## 2016-02-10 ENCOUNTER — Encounter: Payer: Self-pay | Admitting: Neurology

## 2016-02-10 ENCOUNTER — Ambulatory Visit (INDEPENDENT_AMBULATORY_CARE_PROVIDER_SITE_OTHER): Payer: PPO | Admitting: Neurology

## 2016-02-10 VITALS — BP 115/69 | HR 97 | Ht 62.0 in | Wt 284.1 lb

## 2016-02-10 DIAGNOSIS — I6529 Occlusion and stenosis of unspecified carotid artery: Secondary | ICD-10-CM

## 2016-02-10 DIAGNOSIS — I6990 Unspecified sequelae of unspecified cerebrovascular disease: Secondary | ICD-10-CM | POA: Diagnosis not present
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
## 2016-02-10 NOTE — Patient Instructions (Signed)
I had a long d/w patient about her remote stroke, atrial fibrillation, risk for recurrent stroke/TIAs, personally independently reviewed imaging studies and stroke evaluation results and answered questions.Continue Xarelto (rivaroxaban) daily  for secondary stroke prevention and maintain strict control of hypertension with blood pressure goal below 130/90, diabetes with hemoglobin A1c goal below 6.5% and lipids with LDL cholesterol goal below 70 mg/dL. I also advised the patient to eat a healthy diet with plenty of whole grains, cereals, fruits and vegetables, exercise regularly and maintain ideal body weight .she was advised to use her cane at all times and fall risk prevention precautions. Check follow-up carotid ultrasound study as it has not been done for 2 years. Followup in the future with me only as needed and no routine follow-up appointment is necessary Fall Prevention in the Home  Falls can cause injuries. They can happen to people of all ages. There are many things you can do to make your home safe and to help prevent falls.  WHAT CAN I DO ON THE OUTSIDE OF MY HOME?  Regularly fix the edges of walkways and driveways and fix any cracks.  Remove anything that might make you trip as you walk through a door, such as a raised step or threshold.  Trim any bushes or trees on the path to your home.  Use bright outdoor lighting.  Clear any walking paths of anything that might make someone trip, such as rocks or tools.  Regularly check to see if handrails are loose or broken. Make sure that both sides of any steps have handrails.  Any raised decks and porches should have guardrails on the edges.  Have any leaves, snow, or ice cleared regularly.  Use sand or salt on walking paths during winter.  Clean up any spills in your garage right away. This includes oil or grease spills. WHAT CAN I DO IN THE BATHROOM?   Use night lights.  Install grab bars by the toilet and in the tub and shower. Do  not use towel bars as grab bars.  Use non-skid mats or decals in the tub or shower.  If you need to sit down in the shower, use a plastic, non-slip stool.  Keep the floor dry. Clean up any water that spills on the floor as soon as it happens.  Remove soap buildup in the tub or shower regularly.  Attach bath mats securely with double-sided non-slip rug tape.  Do not have throw rugs and other things on the floor that can make you trip. WHAT CAN I DO IN THE BEDROOM?  Use night lights.  Make sure that you have a light by your bed that is easy to reach.  Do not use any sheets or blankets that are too big for your bed. They should not hang down onto the floor.  Have a firm chair that has side arms. You can use this for support while you get dressed.  Do not have throw rugs and other things on the floor that can make you trip. WHAT CAN I DO IN THE KITCHEN?  Clean up any spills right away.  Avoid walking on wet floors.  Keep items that you use a lot in easy-to-reach places.  If you need to reach something above you, use a strong step stool that has a grab bar.  Keep electrical cords out of the way.  Do not use floor polish or wax that makes floors slippery. If you must use wax, use non-skid floor wax.  Do  not have throw rugs and other things on the floor that can make you trip. WHAT CAN I DO WITH MY STAIRS?  Do not leave any items on the stairs.  Make sure that there are handrails on both sides of the stairs and use them. Fix handrails that are broken or loose. Make sure that handrails are as long as the stairways.  Check any carpeting to make sure that it is firmly attached to the stairs. Fix any carpet that is loose or worn.  Avoid having throw rugs at the top or bottom of the stairs. If you do have throw rugs, attach them to the floor with carpet tape.  Make sure that you have a light switch at the top of the stairs and the bottom of the stairs. If you do not have them,  ask someone to add them for you. WHAT ELSE CAN I DO TO HELP PREVENT FALLS?  Wear shoes that:  Do not have high heels.  Have rubber bottoms.  Are comfortable and fit you well.  Are closed at the toe. Do not wear sandals.  If you use a stepladder:  Make sure that it is fully opened. Do not climb a closed stepladder.  Make sure that both sides of the stepladder are locked into place.  Ask someone to hold it for you, if possible.  Clearly mark and make sure that you can see:  Any grab bars or handrails.  First and last steps.  Where the edge of each step is.  Use tools that help you move around (mobility aids) if they are needed. These include:  Canes.  Walkers.  Scooters.  Crutches.  Turn on the lights when you go into a dark area. Replace any light bulbs as soon as they burn out.  Set up your furniture so you have a clear path. Avoid moving your furniture around.  If any of your floors are uneven, fix them.  If there are any pets around you, be aware of where they are.  Review your medicines with your doctor. Some medicines can make you feel dizzy. This can increase your chance of falling. Ask your doctor what other things that you can do to help prevent falls.   This information is not intended to replace advice given to you by your health care provider. Make sure you discuss any questions you have with your health care provider.   Document Released: 06/18/2009 Document Revised: 01/06/2015 Document Reviewed: 09/26/2014 Elsevier Interactive Patient Education [employer].

## 2016-02-16 ENCOUNTER — Other Ambulatory Visit: Payer: Self-pay | Admitting: Internal Medicine

## 2016-02-16 DIAGNOSIS — M19012 Primary osteoarthritis, left shoulder: Secondary | ICD-10-CM | POA: Diagnosis not present

## 2016-02-16 DIAGNOSIS — M25512 Pain in left shoulder: Secondary | ICD-10-CM | POA: Diagnosis not present

## 2016-02-16 NOTE — Telephone Encounter (Signed)
Sent to the pharmacy by e-scribe. 

## 2016-02-17 ENCOUNTER — Telehealth: Payer: Self-pay | Admitting: *Deleted

## 2016-02-17 NOTE — Telephone Encounter (Signed)
Requesting surgical clearance:   1. Type of surgery: left shoulder: left total shoulder arthroplasty  2. Surgeon: Dr Esmond Plants  3. Surgical date: pending receipt of clearance  4. Medications that need to be held: Xarelto and for how many days  5. CAD: No     6. I will defer to: Dr [facility] Attn: Margarita Grizzle [phone] fax [phone]- phone

## 2016-02-17 NOTE — Telephone Encounter (Signed)
Hold Xarelto for 3 days

## 2016-02-17 NOTE — Telephone Encounter (Signed)
Message routed to number provided.

## 2016-02-22 ENCOUNTER — Other Ambulatory Visit: Payer: Self-pay | Admitting: Physician Assistant

## 2016-02-22 NOTE — Telephone Encounter (Signed)
Rx(s) sent to pharmacy electronically.

## 2016-03-04 ENCOUNTER — Ambulatory Visit (INDEPENDENT_AMBULATORY_CARE_PROVIDER_SITE_OTHER): Payer: PPO | Admitting: Family Medicine

## 2016-03-04 DIAGNOSIS — E538 Deficiency of other specified B group vitamins: Secondary | ICD-10-CM

## 2016-03-04 MED ORDER — CYANOCOBALAMIN 1000 MCG/ML IJ SOLN
1000.0000 ug | Freq: Once | INTRAMUSCULAR | Status: AC
  Administered 2016-03-04: 1000 ug via INTRAMUSCULAR

## 2016-03-04 NOTE — Progress Notes (Signed)
Gave patient B12 injection in R Deltoid. Patient tolerated well. Band-Aid applied.

## 2016-03-13 ENCOUNTER — Other Ambulatory Visit: Payer: Self-pay | Admitting: Internal Medicine

## 2016-03-14 ENCOUNTER — Telehealth: Payer: Self-pay | Admitting: Neurology

## 2016-03-14 NOTE — Telephone Encounter (Signed)
Please call patient back to reschedule her carotid artery appt.  Thanks!

## 2016-03-14 NOTE — Telephone Encounter (Signed)
Ok to refill x 1

## 2016-03-15 DIAGNOSIS — H26491 Other secondary cataract, right eye: Secondary | ICD-10-CM | POA: Diagnosis not present

## 2016-03-15 DIAGNOSIS — Z961 Presence of intraocular lens: Secondary | ICD-10-CM | POA: Diagnosis not present

## 2016-03-15 DIAGNOSIS — H524 Presbyopia: Secondary | ICD-10-CM | POA: Diagnosis not present

## 2016-03-15 NOTE — Telephone Encounter (Signed)
Called to the pharmacy and left on machine.

## 2016-03-16 ENCOUNTER — Other Ambulatory Visit: Payer: PPO

## 2016-03-23 ENCOUNTER — Ambulatory Visit (INDEPENDENT_AMBULATORY_CARE_PROVIDER_SITE_OTHER): Payer: PPO

## 2016-03-23 DIAGNOSIS — I6990 Unspecified sequelae of unspecified cerebrovascular disease: Secondary | ICD-10-CM

## 2016-03-23 DIAGNOSIS — I6529 Occlusion and stenosis of unspecified carotid artery: Secondary | ICD-10-CM

## 2016-04-07 ENCOUNTER — Telehealth: Payer: Self-pay | Admitting: Cardiovascular Disease

## 2016-04-07 NOTE — Telephone Encounter (Signed)
Returned patient call-pt requesting paperwork for xarelto patient assistance "refresher" to be filled out.  Pt reports she has ~2 months left and is about to be in the donut hole.   Primary RN Anderson Malta aware and will f/u.    Will route to primary RN.    Patient aware and verbalized understanding.

## 2016-04-07 NOTE — Telephone Encounter (Signed)
New message      Calling to see if we still have the paperwork to get xarelto free?  She is about to be in the donut hole.  Please call

## 2016-04-08 DIAGNOSIS — M4726 Other spondylosis with radiculopathy, lumbar region: Secondary | ICD-10-CM | POA: Diagnosis not present

## 2016-04-08 DIAGNOSIS — M4727 Other spondylosis with radiculopathy, lumbosacral region: Secondary | ICD-10-CM | POA: Diagnosis not present

## 2016-04-08 NOTE — Telephone Encounter (Signed)
Returned call to patient. Let her know she will need to fill out Wynetta Emery and [place] Patients Assistance information and provide copy of her 1040. She requested for me to mail her the form and she will fill it out and mail it back to me to complete.  Information mailed to pt.

## 2016-04-11 ENCOUNTER — Other Ambulatory Visit: Payer: Self-pay | Admitting: Cardiovascular Disease

## 2016-04-12 ENCOUNTER — Ambulatory Visit (INDEPENDENT_AMBULATORY_CARE_PROVIDER_SITE_OTHER): Payer: PPO | Admitting: Family Medicine

## 2016-04-12 DIAGNOSIS — E538 Deficiency of other specified B group vitamins: Secondary | ICD-10-CM

## 2016-04-12 MED ORDER — CYANOCOBALAMIN 1000 MCG/ML IJ SOLN
1000.0000 ug | Freq: Once | INTRAMUSCULAR | Status: AC
  Administered 2016-04-12: 1000 ug via INTRAMUSCULAR
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
## 2016-04-20 ENCOUNTER — Encounter (HOSPITAL_COMMUNITY)
Admission: RE | Admit: 2016-04-20 | Discharge: 2016-04-20 | Disposition: A | Discharge status: Home / Self Care | Payer: PPO | Source: Ambulatory Visit | Attending: Orthopedic Surgery | Admitting: Orthopedic Surgery

## 2016-04-20 ENCOUNTER — Encounter (HOSPITAL_COMMUNITY): Payer: Self-pay

## 2016-04-20 DIAGNOSIS — Z79899 Other long term (current) drug therapy: Secondary | ICD-10-CM | POA: Diagnosis not present

## 2016-04-20 DIAGNOSIS — Z7901 Long term (current) use of anticoagulants: Secondary | ICD-10-CM | POA: Insufficient documentation

## 2016-04-20 DIAGNOSIS — M19012 Primary osteoarthritis, left shoulder: Secondary | ICD-10-CM | POA: Diagnosis not present

## 2016-04-20 DIAGNOSIS — G4733 Obstructive sleep apnea (adult) (pediatric): Secondary | ICD-10-CM | POA: Diagnosis not present

## 2016-04-20 DIAGNOSIS — I10 Essential (primary) hypertension: Secondary | ICD-10-CM | POA: Diagnosis not present

## 2016-04-20 DIAGNOSIS — I4891 Unspecified atrial fibrillation: Secondary | ICD-10-CM | POA: Diagnosis not present

## 2016-04-20 DIAGNOSIS — E785 Hyperlipidemia, unspecified: Secondary | ICD-10-CM | POA: Insufficient documentation

## 2016-04-20 DIAGNOSIS — Z01812 Encounter for preprocedural laboratory examination: Secondary | ICD-10-CM | POA: Insufficient documentation

## 2016-04-20 DIAGNOSIS — Z8673 Personal history of transient ischemic attack (TIA), and cerebral infarction without residual deficits: Secondary | ICD-10-CM | POA: Insufficient documentation

## 2016-04-20 DIAGNOSIS — Z01818 Encounter for other preprocedural examination: Secondary | ICD-10-CM | POA: Insufficient documentation

## 2016-04-20 DIAGNOSIS — E039 Hypothyroidism, unspecified: Secondary | ICD-10-CM | POA: Diagnosis not present

## 2016-04-20 HISTORY — DX: Gastro-esophageal reflux disease without esophagitis: K21.9

## 2016-04-20 HISTORY — DX: Unspecified osteoarthritis, unspecified site: M19.90

## 2016-04-20 LAB — BASIC METABOLIC PANEL
Anion gap: 6 (ref 5–15)
BUN: 17 mg/dL (ref 6–20)
CALCIUM: 9.3 mg/dL (ref 8.9–10.3)
CO2: 27 mmol/L (ref 22–32)
CREATININE: 0.9 mg/dL (ref 0.44–1.00)
Chloride: 108 mmol/L (ref 101–111)
GLUCOSE: 102 mg/dL — AB (ref 65–99)
Potassium: 4.1 mmol/L (ref 3.5–5.1)
Sodium: 141 mmol/L (ref 135–145)

## 2016-04-20 LAB — SURGICAL PCR SCREEN
MRSA, PCR: NEGATIVE
STAPHYLOCOCCUS AUREUS: NEGATIVE

## 2016-04-20 LAB — CBC
HCT: 40.6 % (ref 36.0–46.0)
Hemoglobin: 12.7 g/dL (ref 12.0–15.0)
MCH: 32.1 pg (ref 26.0–34.0)
MCHC: 31.3 g/dL (ref 30.0–36.0)
MCV: 102.5 fL — ABNORMAL HIGH (ref 78.0–100.0)
PLATELETS: 181 10*3/uL (ref 150–400)
RBC: 3.96 MIL/uL (ref 3.87–5.11)
RDW: 15.2 % (ref 11.5–15.5)
WBC: 8.6 10*3/uL (ref 4.0–10.5)

## 2016-04-20 NOTE — Pre-Procedure Instructions (Signed)
[name]  04/20/2016      [id number] [place], [place] [address] [place]Phone: [phone]: [phone] [facility]trD[facility] [address]Phone: [phone]: [phone]   Your procedure is scheduled on Friday, April 29, 2016  [facility]  Call this number if you have problems the morning of surgery:  [phone]   Remember: Follow doctors instructions regarding Xarelto  Do not eat food or drink liquids after midnight Thursday, April 28, 2016  Take these medicines the morning of surgery with A SIP OF WATER : Atenolol, Synthroid , Allopurinol, if needed: Tramadol ( Ultram) for pain Stop taking Aspirin, vitamins, fish oil and herbal medications. Do not take any NSAIDs ie: Ibuprofen, Advil, Naproxen or any medication containing Aspirin such as Voltaren Gel; stop Friday, April 22, 2016  Do not wear jewelry, make-up or nail polish.  Do not wear lotions, powders, or perfumes.  You may not wear deoderant.  Do not shave 48 hours prior to surgery.   Do not bring valuables to the hospital.  [facility] or [facility].  Contacts, dentures or bridgework may not be worn into surgery.  Leave your suitcase in the car.  After surgery it may be brought to your room.  For patients admitted to the hospital, discharge time will be determined by your treatment team.  Patients discharged the day of surgery will not be allowed to drive home.   Name and phone number of your driver:   Special instructions:  [facility]- Preparing for Surgery  Before surgery, you can play an important role.  Because skin is not sterile, your skin needs to be as free of germs as possible.  You can reduce the number of germs on you skin by washing with  CHG (chlorahexidine gluconate) soap before surgery.  CHG is an antiseptic cleaner which kills germs and bonds with the skin to continue killing germs even after washing.  Please DO NOT use if you have an allergy to CHG or antibacterial soaps.  If your skin becomes reddened/irritated stop using the CHG and inform your nurse when you arrive at Short Stay.  Do not shave (including legs and underarms) for at least 48 hours prior to the first CHG shower.  You may shave your face.  Please follow these instructions carefully:   1.  Shower with CHG Soap the night before surgery and the morning of Surgery.  2.  If you choose to wash your hair, wash your hair first as usual with your normal shampoo.  3.  After you shampoo, rinse your hair and body thoroughly to remove the Shampoo.  4.  Use CHG as you would any other liquid soap.  You can apply chg directly  to the skin and wash gently with scrungie or a clean washcloth.  5.  Apply the CHG Soap to your body ONLY FROM THE NECK DOWN.  Do not use on open wounds or open sores.  Avoid contact with your eyes, ears, mouth and genitals (private parts).  Wash genitals (private parts) with your normal soap.  6.  Wash thoroughly, paying special attention to the area where your surgery will be performed.  7.  Thoroughly rinse your body with warm water from the  neck down.  8.  DO NOT shower/wash with your normal soap after using and rinsing off the CHG Soap.  9.  Pat yourself dry with a clean towel.            10.  Wear clean pajamas.            11.  Place clean sheets on your bed the night of your first shower and do not sleep with pets.  Day of Surgery  Do not apply any lotions/deoderants the morning of surgery.  Please wear clean clothes to the hospital/surgery center.  Please read over the following fact sheets that you were given. Pain Booklet, Coughing and Deep Breathing, MRSA Information and Surgical Site Infection Prevention

## 2016-04-20 NOTE — Progress Notes (Signed)
Pt denies SOB and chest pain but is under the care of Dr. Gwenlyn Found, Cardiology. Pt denies having a heart cath. Pt verbalized understanding that she is to stop Xarelto 3 days prior to surgery ( per Dr. Kennon Holter note on chart) and stated, " my last dose will be on  Monday. Pt chart forwarded to anesthesia for review of cardiac history and cardiac clearance note on chart.

## 2016-04-21 NOTE — Progress Notes (Signed)
Anesthesia Chart Review:  Pt is a 65 year old female scheduled for L total shoulder arthroplasty on 04/29/2016 with Netta Cedars, MD.   Cardiologist is Quay Burow, MD who is aware of upcoming surgery. PCP is [name], MD.   PMH includes:  Atrial fibrillation, HTN, stroke (2012), OSA, hyperlipidemia, hypothyroidism. Never smoker. BMI 54  Medications include: atenolol, lasix, potassium, levothyroxine, xarelto. Last dose xarelto 04/25/16.   Preoperative labs reviewed.    chest X-ray 05/25/15: Cardiomegaly and pulmonary vascular congestion.  No acute finding  EKG 10/27/15: atrial fibrillation with a ventricular response of 83 and occasional aberrantly conducted beat with septal Q waves  Echo 09/22/15:  - Left ventricle: The cavity size was normal. Wall thickness was increased in a pattern of mild LVH. Systolic function was normal. The estimated ejection fraction was in the range of 55% to 60%. Wall motion was normal; there were no regional wall motion abnormalities. Doppler parameters are consistent with high ventricular filling pressure. - Mitral valve: Calcified annulus. There was mild regurgitation. - Left atrium: The atrium was moderately dilated. - Right atrium: The atrium was moderately dilated. - Pulmonary arteries: Systolic pressure was moderately to severely increased. PA peak pressure: 61 mm Hg (S).  Carotid duplex 02/07/14: no hemodynamically significant stenosis in B extracranial arteries  If no changes, I anticipate pt can proceed with surgery as scheduled.   Willeen Cass, FNP-BC [facility] Stay Surgical [facility]/Anesthesiology Phone: [phone] 04/21/2016 2:45 PM

## 2016-04-28 NOTE — Telephone Encounter (Signed)
Received in the mail the information form patient that is needed to complete the application for assistance with Xarelto. Called patient to let her know I have received the information. The documents she mailed are copies and she does not need them mailed back to her. She was very thankful.

## 2016-04-28 NOTE — Anesthesia Preprocedure Evaluation (Addendum)
Anesthesia Evaluation  Patient identified by MRN, date of birth, ID band Patient awake    Reviewed: Allergy & Precautions, H&P , NPO status , Patient's Chart, lab work & pertinent test results, reviewed documented beta blocker date and time   History of Anesthesia Complications Negative for: history of anesthetic complications  Airway Mallampati: III  TM Distance: >3 FB Neck ROM: Full    Dental no notable dental hx. (+) Teeth Intact   Pulmonary shortness of breath (attributes to being overweight) and with exertion, sleep apnea (no CPAP, had surgery (T&A, UPPP, septoplasty)) ,    Pulmonary exam normal breath sounds clear to auscultation       Cardiovascular Exercise Tolerance: Good hypertension, On Home Beta Blockers + dysrhythmias Atrial Fibrillation + Valvular Problems/Murmurs  Rhythm:Regular Rate:Normal     Neuro/Psych CVA thought due to atrial fibrillation.  Neuromuscular disease CVA (10/2010, weakness left leg, pain top of right foot.  Has been on xeralto, stopped thursday), Residual Symptoms negative psych ROS   GI/Hepatic negative GI ROS, Neg liver ROS,   Endo/Other  negative endocrine ROSHypothyroidism Morbid obesity  Renal/GU negative Renal ROS     Musculoskeletal  (+) Arthritis  (back, knees, shoulders), Osteoarthritis,    Abdominal Normal abdominal exam  (+)   Peds  Hematology negative hematology ROS (+) anemia , Patient has been on Xeralto for anticoagulation for her atrial fibrillation.   Anesthesia Other Findings   Reproductive/Obstetrics negative OB ROS                             Anesthesia Physical  Anesthesia Plan  ASA: IV  Anesthesia Plan: General   Post-op Pain Management: GA combined w/ Regional for post-op pain   Induction: Intravenous  Airway Management Planned: Oral ETT  Additional Equipment: Arterial line  Intra-op Plan:   Post-operative Plan: Extubation  in OR  Informed Consent: I have reviewed the patients History and Physical, chart, labs and discussed the procedure including the risks, benefits and alternatives for the proposed anesthesia with the patient or authorized representative who has indicated his/her understanding and acceptance.   Dental advisory given  Plan Discussed with: CRNA  Anesthesia Plan Comments:        Anesthesia Quick Evaluation

## 2016-04-29 ENCOUNTER — Encounter (HOSPITAL_COMMUNITY): Payer: Self-pay | Admitting: Anesthesiology

## 2016-04-29 ENCOUNTER — Inpatient Hospital Stay (HOSPITAL_COMMUNITY): Payer: PPO | Admitting: Emergency Medicine

## 2016-04-29 ENCOUNTER — Encounter (HOSPITAL_COMMUNITY)
Admission: RE | Disposition: A | Discharge status: Skilled Nursing Facility | Payer: Self-pay | Source: Ambulatory Visit | Attending: Orthopedic Surgery

## 2016-04-29 ENCOUNTER — Inpatient Hospital Stay (HOSPITAL_COMMUNITY)
Admission: RE | Admit: 2016-04-29 | Discharge: 2016-05-02 | DRG: 483 | Disposition: A | Discharge status: Skilled Nursing Facility | Payer: PPO | Source: Ambulatory Visit | Attending: Orthopedic Surgery | Admitting: Orthopedic Surgery

## 2016-04-29 ENCOUNTER — Inpatient Hospital Stay (HOSPITAL_COMMUNITY): Payer: PPO

## 2016-04-29 DIAGNOSIS — Z96612 Presence of left artificial shoulder joint: Secondary | ICD-10-CM

## 2016-04-29 DIAGNOSIS — Z471 Aftercare following joint replacement surgery: Secondary | ICD-10-CM | POA: Diagnosis not present

## 2016-04-29 DIAGNOSIS — G4730 Sleep apnea, unspecified: Secondary | ICD-10-CM | POA: Diagnosis present

## 2016-04-29 DIAGNOSIS — Z96653 Presence of artificial knee joint, bilateral: Secondary | ICD-10-CM | POA: Diagnosis not present

## 2016-04-29 DIAGNOSIS — I4891 Unspecified atrial fibrillation: Secondary | ICD-10-CM | POA: Diagnosis present

## 2016-04-29 DIAGNOSIS — Z9841 Cataract extraction status, right eye: Secondary | ICD-10-CM | POA: Diagnosis not present

## 2016-04-29 DIAGNOSIS — Z9842 Cataract extraction status, left eye: Secondary | ICD-10-CM | POA: Diagnosis not present

## 2016-04-29 DIAGNOSIS — Q782 Osteopetrosis: Secondary | ICD-10-CM | POA: Diagnosis not present

## 2016-04-29 DIAGNOSIS — M659 Synovitis and tenosynovitis, unspecified: Secondary | ICD-10-CM | POA: Diagnosis present

## 2016-04-29 DIAGNOSIS — Z888 Allergy status to other drugs, medicaments and biological substances status: Secondary | ICD-10-CM | POA: Diagnosis not present

## 2016-04-29 DIAGNOSIS — Z886 Allergy status to analgesic agent status: Secondary | ICD-10-CM | POA: Diagnosis not present

## 2016-04-29 DIAGNOSIS — Z6841 Body Mass Index (BMI) 40.0 and over, adult: Secondary | ICD-10-CM | POA: Diagnosis not present

## 2016-04-29 DIAGNOSIS — Z8673 Personal history of transient ischemic attack (TIA), and cerebral infarction without residual deficits: Secondary | ICD-10-CM | POA: Diagnosis not present

## 2016-04-29 DIAGNOSIS — Z7901 Long term (current) use of anticoagulants: Secondary | ICD-10-CM | POA: Diagnosis not present

## 2016-04-29 DIAGNOSIS — E785 Hyperlipidemia, unspecified: Secondary | ICD-10-CM | POA: Diagnosis not present

## 2016-04-29 DIAGNOSIS — I119 Hypertensive heart disease without heart failure: Secondary | ICD-10-CM | POA: Diagnosis present

## 2016-04-29 DIAGNOSIS — Z79899 Other long term (current) drug therapy: Secondary | ICD-10-CM | POA: Diagnosis not present

## 2016-04-29 DIAGNOSIS — M19012 Primary osteoarthritis, left shoulder: Principal | ICD-10-CM | POA: Diagnosis present

## 2016-04-29 DIAGNOSIS — E039 Hypothyroidism, unspecified: Secondary | ICD-10-CM | POA: Diagnosis present

## 2016-04-29 DIAGNOSIS — Z96619 Presence of unspecified artificial shoulder joint: Secondary | ICD-10-CM

## 2016-04-29 DIAGNOSIS — Z881 Allergy status to other antibiotic agents status: Secondary | ICD-10-CM

## 2016-04-29 HISTORY — PX: TOTAL SHOULDER ARTHROPLASTY: SHX126

## 2016-04-29 SURGERY — ARTHROPLASTY, SHOULDER, TOTAL
Anesthesia: General | Laterality: Left

## 2016-04-29 MED ORDER — LEVOTHYROXINE SODIUM 75 MCG PO TABS
75.0000 ug | ORAL_TABLET | Freq: Every day | ORAL | Status: DC
  Administered 2016-04-30 – 2016-05-02 (×3): 75 ug via ORAL
  Filled 2016-04-29 (×3): qty 1

## 2016-04-29 MED ORDER — BUPIVACAINE-EPINEPHRINE (PF) 0.5% -1:200000 IJ SOLN
INTRAMUSCULAR | Status: DC | PRN
  Administered 2016-04-29: 30 mL via PERINEURAL

## 2016-04-29 MED ORDER — ONDANSETRON HCL 4 MG/2ML IJ SOLN
INTRAMUSCULAR | Status: DC | PRN
  Administered 2016-04-29: 4 mg via INTRAVENOUS

## 2016-04-29 MED ORDER — HYDROMORPHONE HCL 1 MG/ML IJ SOLN: 0.2500 mg | INTRAMUSCULAR | Status: DC | PRN

## 2016-04-29 MED ORDER — EPHEDRINE 5 MG/ML INJ
INTRAVENOUS | Status: AC
  Filled 2016-04-29: qty 10

## 2016-04-29 MED ORDER — SUCCINYLCHOLINE CHLORIDE 200 MG/10ML IV SOSY
PREFILLED_SYRINGE | INTRAVENOUS | Status: AC
  Filled 2016-04-29: qty 10

## 2016-04-29 MED ORDER — DOCUSATE SODIUM 100 MG PO CAPS
100.0000 mg | ORAL_CAPSULE | Freq: Two times a day (BID) | ORAL | Status: DC
  Administered 2016-04-29 – 2016-05-02 (×6): 100 mg via ORAL
  Filled 2016-04-29 (×6): qty 1

## 2016-04-29 MED ORDER — ATENOLOL 50 MG PO TABS: 50.0000 mg | ORAL_TABLET | ORAL | Status: DC

## 2016-04-29 MED ORDER — CLINDAMYCIN PHOSPHATE 900 MG/50ML IV SOLN
INTRAVENOUS | Status: DC | PRN
  Administered 2016-04-29: 900 mg via INTRAVENOUS

## 2016-04-29 MED ORDER — METOCLOPRAMIDE HCL 5 MG PO TABS: 5.0000 mg | ORAL_TABLET | Freq: Three times a day (TID) | ORAL | Status: DC | PRN

## 2016-04-29 MED ORDER — METOCLOPRAMIDE HCL 5 MG/ML IJ SOLN: 5.0000 mg | Freq: Three times a day (TID) | INTRAMUSCULAR | Status: DC | PRN

## 2016-04-29 MED ORDER — 0.9 % SODIUM CHLORIDE (POUR BTL) OPTIME
TOPICAL | Status: DC | PRN
  Administered 2016-04-29: 1000 mL

## 2016-04-29 MED ORDER — CLINDAMYCIN PHOSPHATE 600 MG/50ML IV SOLN
600.0000 mg | Freq: Four times a day (QID) | INTRAVENOUS | Status: AC
Start: 2016-04-29 — End: 2016-04-30
  Administered 2016-04-29 – 2016-04-30 (×3): 600 mg via INTRAVENOUS
  Filled 2016-04-29 (×3): qty 50

## 2016-04-29 MED ORDER — HEMOSTATIC AGENTS (NO CHARGE) OPTIME
TOPICAL | Status: DC | PRN
  Administered 2016-04-29: 1 via TOPICAL

## 2016-04-29 MED ORDER — TRAMADOL HCL 50 MG PO TABS: 50.0000 mg | ORAL_TABLET | Freq: Four times a day (QID) | ORAL | 0 refills | Status: DC | PRN

## 2016-04-29 MED ORDER — LACTATED RINGERS IV SOLN
INTRAVENOUS | Status: DC | PRN
  Administered 2016-04-29 (×2): via INTRAVENOUS

## 2016-04-29 MED ORDER — FENTANYL CITRATE (PF) 100 MCG/2ML IJ SOLN
INTRAMUSCULAR | Status: AC
  Filled 2016-04-29: qty 2

## 2016-04-29 MED ORDER — PROMETHAZINE HCL 25 MG/ML IJ SOLN: 6.2500 mg | INTRAMUSCULAR | Status: DC | PRN

## 2016-04-29 MED ORDER — POTASSIUM CHLORIDE ER 10 MEQ PO TBCR
10.0000 meq | EXTENDED_RELEASE_TABLET | Freq: Two times a day (BID) | ORAL | Status: DC
  Administered 2016-04-29 – 2016-05-02 (×6): 10 meq via ORAL
  Filled 2016-04-29 (×13): qty 1

## 2016-04-29 MED ORDER — LIDOCAINE HCL (CARDIAC) 20 MG/ML IV SOLN
INTRAVENOUS | Status: DC | PRN
  Administered 2016-04-29: 100 mg via INTRAVENOUS

## 2016-04-29 MED ORDER — POLYETHYLENE GLYCOL 3350 17 G PO PACK: 17.0000 g | PACK | Freq: Every day | ORAL | Status: DC | PRN

## 2016-04-29 MED ORDER — PROPOFOL 10 MG/ML IV BOLUS
INTRAVENOUS | Status: AC
  Filled 2016-04-29: qty 20

## 2016-04-29 MED ORDER — ALLOPURINOL 100 MG PO TABS
200.0000 mg | ORAL_TABLET | Freq: Every day | ORAL | Status: DC
  Administered 2016-04-30 – 2016-05-02 (×3): 200 mg via ORAL
  Filled 2016-04-29 (×3): qty 2

## 2016-04-29 MED ORDER — ONDANSETRON HCL 4 MG/2ML IJ SOLN: 4.0000 mg | Freq: Four times a day (QID) | INTRAMUSCULAR | Status: DC | PRN

## 2016-04-29 MED ORDER — ONDANSETRON HCL 4 MG PO TABS: 4.0000 mg | ORAL_TABLET | Freq: Four times a day (QID) | ORAL | Status: DC | PRN

## 2016-04-29 MED ORDER — METHOCARBAMOL 1000 MG/10ML IJ SOLN
500.0000 mg | Freq: Four times a day (QID) | INTRAVENOUS | Status: DC | PRN
  Filled 2016-04-29: qty 5

## 2016-04-29 MED ORDER — EPHEDRINE SULFATE 50 MG/ML IJ SOLN
INTRAMUSCULAR | Status: DC | PRN
  Administered 2016-04-29 (×2): 7.5 mg via INTRAVENOUS

## 2016-04-29 MED ORDER — ACETAMINOPHEN 10 MG/ML IV SOLN
1000.0000 mg | Freq: Four times a day (QID) | INTRAVENOUS | Status: AC
  Administered 2016-04-29 – 2016-04-30 (×4): 1000 mg via INTRAVENOUS
  Filled 2016-04-29 (×4): qty 100

## 2016-04-29 MED ORDER — LIDOCAINE 2% (20 MG/ML) 5 ML SYRINGE
INTRAMUSCULAR | Status: AC
  Filled 2016-04-29: qty 5

## 2016-04-29 MED ORDER — FENTANYL CITRATE (PF) 100 MCG/2ML IJ SOLN
INTRAMUSCULAR | Status: DC | PRN
  Administered 2016-04-29 (×2): 50 ug via INTRAVENOUS

## 2016-04-29 MED ORDER — FUROSEMIDE 20 MG PO TABS
20.0000 mg | ORAL_TABLET | Freq: Two times a day (BID) | ORAL | Status: DC
  Administered 2016-04-29 – 2016-05-02 (×6): 20 mg via ORAL
  Filled 2016-04-29 (×6): qty 1

## 2016-04-29 MED ORDER — BUPIVACAINE-EPINEPHRINE 0.25% -1:200000 IJ SOLN
INTRAMUSCULAR | Status: DC | PRN
  Administered 2016-04-29: 30 mL

## 2016-04-29 MED ORDER — SODIUM CHLORIDE 0.9 % IR SOLN
Status: DC | PRN
  Administered 2016-04-29: 3000 mL

## 2016-04-29 MED ORDER — CHLORHEXIDINE GLUCONATE 4 % EX LIQD
60.0000 mL | Freq: Once | CUTANEOUS | Status: DC
  Filled 2016-04-29: qty 15

## 2016-04-29 MED ORDER — BUPIVACAINE-EPINEPHRINE (PF) 0.25% -1:200000 IJ SOLN
INTRAMUSCULAR | Status: AC
  Filled 2016-04-29: qty 30

## 2016-04-29 MED ORDER — PHENYLEPHRINE HCL 10 MG/ML IJ SOLN
INTRAVENOUS | Status: DC | PRN
  Administered 2016-04-29: 25 ug/min via INTRAVENOUS

## 2016-04-29 MED ORDER — ATENOLOL 50 MG PO TABS
25.0000 mg | ORAL_TABLET | Freq: Every day | ORAL | Status: DC
  Administered 2016-04-29 – 2016-04-30 (×2): 25 mg via ORAL
  Filled 2016-04-29 (×3): qty 1

## 2016-04-29 MED ORDER — DOCUSATE SODIUM 100 MG PO CAPS: 100.0000 mg | ORAL_CAPSULE | Freq: Every day | ORAL | Status: DC

## 2016-04-29 MED ORDER — THROMBIN 5000 UNITS EX SOLR
CUTANEOUS | Status: AC
  Filled 2016-04-29: qty 5000

## 2016-04-29 MED ORDER — PHENOL 1.4 % MT LIQD: 1.0000 | OROMUCOSAL | Status: DC | PRN

## 2016-04-29 MED ORDER — CLINDAMYCIN PHOSPHATE 900 MG/50ML IV SOLN
INTRAVENOUS | Status: AC
  Filled 2016-04-29: qty 50

## 2016-04-29 MED ORDER — MEPERIDINE HCL 25 MG/ML IJ SOLN: 6.2500 mg | INTRAMUSCULAR | Status: DC | PRN

## 2016-04-29 MED ORDER — MIDAZOLAM HCL 2 MG/2ML IJ SOLN
INTRAMUSCULAR | Status: AC
  Filled 2016-04-29: qty 2

## 2016-04-29 MED ORDER — METHOCARBAMOL 500 MG PO TABS
500.0000 mg | ORAL_TABLET | Freq: Four times a day (QID) | ORAL | Status: DC | PRN
  Administered 2016-05-01 – 2016-05-02 (×2): 500 mg via ORAL
  Filled 2016-04-29 (×2): qty 1

## 2016-04-29 MED ORDER — SUCCINYLCHOLINE CHLORIDE 20 MG/ML IJ SOLN
INTRAMUSCULAR | Status: DC | PRN
  Administered 2016-04-29: 120 mg via INTRAVENOUS

## 2016-04-29 MED ORDER — SODIUM CHLORIDE 0.9 % IV SOLN
INTRAVENOUS | Status: DC
  Administered 2016-04-29: 16:00:00 via INTRAVENOUS

## 2016-04-29 MED ORDER — CLINDAMYCIN PHOSPHATE 900 MG/50ML IV SOLN
900.0000 mg | INTRAVENOUS | Status: DC
  Filled 2016-04-29: qty 50

## 2016-04-29 MED ORDER — ATENOLOL 50 MG PO TABS
50.0000 mg | ORAL_TABLET | Freq: Every day | ORAL | Status: DC
  Administered 2016-04-30 – 2016-05-02 (×3): 50 mg via ORAL
  Filled 2016-04-29 (×3): qty 1

## 2016-04-29 MED ORDER — PHENYLEPHRINE HCL 10 MG/ML IJ SOLN
INTRAMUSCULAR | Status: DC | PRN
  Administered 2016-04-29: 40 ug via INTRAVENOUS

## 2016-04-29 MED ORDER — VITAMIN D 1000 UNITS PO TABS
5000.0000 [IU] | ORAL_TABLET | ORAL | Status: DC
  Administered 2016-04-29 – 2016-05-02 (×2): 5000 [IU] via ORAL
  Filled 2016-04-29 (×2): qty 5

## 2016-04-29 MED ORDER — BISACODYL 10 MG RE SUPP: 10.0000 mg | Freq: Every day | RECTAL | Status: DC | PRN

## 2016-04-29 MED ORDER — RIVAROXABAN 20 MG PO TABS
20.0000 mg | ORAL_TABLET | Freq: Every day | ORAL | Status: DC
  Administered 2016-04-30 – 2016-05-01 (×2): 20 mg via ORAL
  Filled 2016-04-29 (×2): qty 1

## 2016-04-29 MED ORDER — PROPOFOL 10 MG/ML IV BOLUS
INTRAVENOUS | Status: DC | PRN
  Administered 2016-04-29: 80 mg via INTRAVENOUS
  Administered 2016-04-29: 60 mg via INTRAVENOUS
  Administered 2016-04-29: 120 mg via INTRAVENOUS

## 2016-04-29 MED ORDER — OMEGA-3-ACID ETHYL ESTERS 1 G PO CAPS
2.0000 g | ORAL_CAPSULE | Freq: Every day | ORAL | Status: DC
  Administered 2016-04-30 – 2016-05-02 (×3): 2 g via ORAL
  Filled 2016-04-29 (×3): qty 2

## 2016-04-29 MED ORDER — MENTHOL 3 MG MT LOZG: 1.0000 | LOZENGE | OROMUCOSAL | Status: DC | PRN

## 2016-04-29 MED ORDER — TRAMADOL HCL 50 MG PO TABS
50.0000 mg | ORAL_TABLET | Freq: Four times a day (QID) | ORAL | Status: DC | PRN
  Administered 2016-04-29 (×2): 100 mg via ORAL
  Administered 2016-04-30: 50 mg via ORAL
  Administered 2016-04-30 – 2016-05-02 (×4): 100 mg via ORAL
  Filled 2016-04-29 (×5): qty 2
  Filled 2016-04-29: qty 1
  Filled 2016-04-29: qty 2

## 2016-04-29 MED ORDER — MIDAZOLAM HCL 5 MG/5ML IJ SOLN
INTRAMUSCULAR | Status: DC | PRN
Start: 2016-04-29 — End: 2016-04-29
  Administered 2016-04-29 (×2): 1 mg via INTRAVENOUS

## 2016-04-29 MED ORDER — MORPHINE SULFATE (PF) 2 MG/ML IV SOLN: 2.0000 mg | INTRAVENOUS | Status: DC | PRN

## 2016-04-29 SURGICAL SUPPLY — 73 items
BIT DRILL 170X2.5X (BIT) ×1 IMPLANT
BIT DRL 170X2.5X (BIT) ×1
BLADE SAW SAG 73X25 THK (BLADE) ×1
BLADE SAW SGTL 73X25 THK (BLADE) ×1 IMPLANT
BUR SURG 4X8 MED (BURR) IMPLANT
BURR SURG 4X8 MED (BURR)
CAPT SHLDR REVTOTAL 2 ×2 IMPLANT
CEMENT BONE DEPUY (Cement) IMPLANT
COVER SURGICAL LIGHT HANDLE (MISCELLANEOUS) ×2 IMPLANT
DECANTER SPIKE VIAL GLASS SM (MISCELLANEOUS) ×2 IMPLANT
DRAPE IMP U-DRAPE 54X76 (DRAPES) ×2 IMPLANT
DRAPE INCISE IOBAN 66X45 STRL (DRAPES) ×6 IMPLANT
DRAPE OEC MINIVIEW 54X84 (DRAPES) ×2 IMPLANT
DRAPE U-SHAPE 47X51 STRL (DRAPES) ×2 IMPLANT
DRAPE X-RAY CASS 24X20 (DRAPES) IMPLANT
DRILL 2.5 (BIT) ×1
DRILL BIT 5/64 (BIT) IMPLANT
DRSG ADAPTIC 3X8 NADH LF (GAUZE/BANDAGES/DRESSINGS) ×2 IMPLANT
DRSG PAD ABDOMINAL 8X10 ST (GAUZE/BANDAGES/DRESSINGS) ×2 IMPLANT
DURAPREP 26ML APPLICATOR (WOUND CARE) ×2 IMPLANT
ELECT BLADE 4.0 EZ CLEAN MEGAD (MISCELLANEOUS) ×4
ELECT NEEDLE TIP 2.8 STRL (NEEDLE) ×2 IMPLANT
ELECT REM PT RETURN 9FT ADLT (ELECTROSURGICAL) ×2
ELECTRODE BLDE 4.0 EZ CLN MEGD (MISCELLANEOUS) ×2 IMPLANT
ELECTRODE REM PT RTRN 9FT ADLT (ELECTROSURGICAL) ×1 IMPLANT
GAUZE SPONGE 4X4 12PLY STRL (GAUZE/BANDAGES/DRESSINGS) ×2 IMPLANT
GLOVE BIOGEL PI ORTHO PRO 7.5 (GLOVE) ×1
GLOVE BIOGEL PI ORTHO PRO SZ8 (GLOVE) ×1
GLOVE ORTHO TXT STRL SZ7.5 (GLOVE) ×2 IMPLANT
GLOVE PI ORTHO PRO STRL 7.5 (GLOVE) ×1 IMPLANT
GLOVE PI ORTHO PRO STRL SZ8 (GLOVE) ×1 IMPLANT
GLOVE SURG ORTHO 8.5 STRL (GLOVE) ×4 IMPLANT
GOWN STRL REUS W/ TWL XL LVL3 (GOWN DISPOSABLE) ×3 IMPLANT
GOWN STRL REUS W/TWL XL LVL3 (GOWN DISPOSABLE) ×3
HANDPIECE INTERPULSE COAX TIP (DISPOSABLE)
KIT BASIN OR (CUSTOM PROCEDURE TRAY) ×2 IMPLANT
KIT ROOM TURNOVER OR (KITS) ×2 IMPLANT
MANIFOLD NEPTUNE II (INSTRUMENTS) ×2 IMPLANT
NDL SUT 6 .5 CRC .975X.05 MAYO (NEEDLE) ×1 IMPLANT
NEEDLE 1/2 CIR MAYO (NEEDLE) ×2 IMPLANT
NEEDLE HYPO 25GX1X1/2 BEV (NEEDLE) ×2 IMPLANT
NEEDLE MAYO TAPER (NEEDLE) ×1
NS IRRIG 1000ML POUR BTL (IV SOLUTION) ×2 IMPLANT
PACK SHOULDER (CUSTOM PROCEDURE TRAY) ×2 IMPLANT
PACK UNIVERSAL I (CUSTOM PROCEDURE TRAY) ×2 IMPLANT
PAD ARMBOARD 7.5X6 YLW CONV (MISCELLANEOUS) ×4 IMPLANT
PIN METAGLENE 2.5 (PIN) ×4 IMPLANT
SET HNDPC FAN SPRY TIP SCT (DISPOSABLE) IMPLANT
SLING ARM IMMOBILIZER LRG (SOFTGOODS) ×4 IMPLANT
SLING ARM IMMOBILIZER MED (SOFTGOODS) IMPLANT
SMARTMIX MINI TOWER (MISCELLANEOUS)
SPONGE GAUZE 4X4 12PLY STER LF (GAUZE/BANDAGES/DRESSINGS) ×2 IMPLANT
SPONGE LAP 18X18 X RAY DECT (DISPOSABLE) ×2 IMPLANT
SPONGE LAP 4X18 X RAY DECT (DISPOSABLE) ×2 IMPLANT
SPONGE SURGIFOAM ABS GEL SZ50 (HEMOSTASIS) IMPLANT
STRIP CLOSURE SKIN 1/2X4 (GAUZE/BANDAGES/DRESSINGS) ×2 IMPLANT
SUCTION FRAZIER HANDLE 10FR (MISCELLANEOUS) ×1
SUCTION TUBE FRAZIER 10FR DISP (MISCELLANEOUS) ×1 IMPLANT
SUT FIBERWIRE #2 38 T-5 BLUE (SUTURE) ×4
SUT MNCRL AB 4-0 PS2 18 (SUTURE) ×2 IMPLANT
SUT VIC AB 0 CT1 27 (SUTURE) ×1
SUT VIC AB 0 CT1 27XBRD ANBCTR (SUTURE) ×1 IMPLANT
SUT VIC AB 2-0 CT1 27 (SUTURE) ×1
SUT VIC AB 2-0 CT1 TAPERPNT 27 (SUTURE) ×1 IMPLANT
SUT VICRYL AB 2 0 TIES (SUTURE) ×2 IMPLANT
SUTURE FIBERWR #2 38 T-5 BLUE (SUTURE) ×2 IMPLANT
SYR CONTROL 10ML LL (SYRINGE) ×2 IMPLANT
TOWEL OR 17X24 6PK STRL BLUE (TOWEL DISPOSABLE) ×2 IMPLANT
TOWEL OR 17X26 10 PK STRL BLUE (TOWEL DISPOSABLE) ×2 IMPLANT
TOWER SMARTMIX MINI (MISCELLANEOUS) IMPLANT
TRAY FOLEY CATH 16FRSI W/METER (SET/KITS/TRAYS/PACK) IMPLANT
WATER STERILE IRR 1000ML POUR (IV SOLUTION) IMPLANT
YANKAUER SUCT BULB TIP NO VENT (SUCTIONS) IMPLANT

## 2016-04-29 NOTE — Interval H&P Note (Signed)
History and Physical Interval Note:  04/29/2016 7:28 AM  [name]  has presented today for surgery, with the diagnosis of LEFT SHOULDER INSTAGE OA  The various methods of treatment have been discussed with the patient and family. After consideration of risks, benefits and other options for treatment, the patient has consented to  Procedure(s): TOTAL SHOULDER ARTHROPLASTY (Left) as a surgical intervention .  The patient's history has been reviewed, patient examined, no change in status, stable for surgery.  I have reviewed the patient's chart and labs.  Questions were answered to the patient's satisfaction.     Harpreet Signore,STEVEN R

## 2016-04-29 NOTE — Progress Notes (Signed)
Reported patient doesn't wear CPAP and doesn't want to wear CPAP at this time. Explained to her if she changes her mind to just call and we'll set her up

## 2016-04-29 NOTE — Anesthesia Procedure Notes (Signed)
Anesthesia Regional Block:  Interscalene brachial plexus block  Pre-Anesthetic Checklist: ,, timeout performed, Correct Patient, Correct Site, Correct Laterality, Correct Procedure, Correct Position, site marked, Risks and benefits discussed, Surgical consent,  Pre-op evaluation,  Post-op pain management  Laterality: Left  Prep: chloraprep       Needles:  Injection technique: Single-shot  Needle Type: Stimiplex      Needle Gauge: 21 G    Additional Needles:  Procedures: ultrasound guided (picture in chart) Interscalene brachial plexus block Narrative:   Performed by: Personally  Anesthesiologist: Nolon Nations  Additional Notes: Patient tolerated the procedure well without complications

## 2016-04-29 NOTE — Progress Notes (Signed)
Care of pt assumed by [facility] RN

## 2016-04-29 NOTE — Brief Op Note (Signed)
04/29/2016  10:48 AM  PATIENT:  [name]  65 y.o. female  PRE-OPERATIVE DIAGNOSIS:  LEFT SHOULDER END STAGE OA  POST-OPERATIVE DIAGNOSIS:  LEFT SHOULDER END STAGE OA, ROTATOR CUFF INSUFFICIENCY PROCEDURE:  Procedure(s): Reverse TOTAL SHOULDER ARTHROPLASTY (Left) REVERSE, DePuy Delta Xtend  SURGEON:  Surgeon(s) and Role:    * Netta Cedars, MD - Primary  PHYSICIAN ASSISTANT:   ASSISTANTS: Ventura Bruns, PA-C   ANESTHESIA:   regional and general  EBL:  Total I/O In: 1000 [I.V.:1000] Out: 300 [Blood:300]  BLOOD ADMINISTERED:none  DRAINS: none   LOCAL MEDICATIONS USED:  MARCAINE     SPECIMEN:  No Specimen  DISPOSITION OF SPECIMEN:  N/A  COUNTS:  YES  TOURNIQUET:  * No tourniquets in log *  DICTATION: .Other Dictation: Dictation Number [id number]  PLAN OF CARE: Admit to inpatient   PATIENT DISPOSITION:  PACU - hemodynamically stable.   Delay start of Pharmacological VTE agent (>24hrs) due to surgical blood loss or risk of bleeding: no

## 2016-04-29 NOTE — Anesthesia Postprocedure Evaluation (Signed)
Anesthesia Post Note  Patient: [name]  Procedure(s) Performed: Procedure(s) (LRB): Reverse TOTAL SHOULDER ARTHROPLASTY (Left)  Patient location during evaluation: PACU Anesthesia Type: General and Regional Level of consciousness: sedated and patient cooperative Pain management: pain level controlled Vital Signs Assessment: post-procedure vital signs reviewed and stable Respiratory status: spontaneous breathing Cardiovascular status: stable Anesthetic complications: no    Last Vitals:  Vitals:   04/29/16 1215 04/29/16 1230  BP:  (!) 94/58  Pulse: 79 69  Resp: 20 (!) 22  Temp:      Last Pain:  Vitals:   04/29/16 1230  TempSrc:   PainSc: 0-No pain                 Nolon Nations

## 2016-04-29 NOTE — Progress Notes (Signed)
Dr Conrad [place] here and looked at PIV site-updated re Dr Ola Spurr. OK to tx to floor.

## 2016-04-29 NOTE — Progress Notes (Signed)
After dc of right rad art line & armboard, above the site of right hand PIV looks puffy and is cool to touch, pt c/o of discomfort. Lead CRNA Ivar Drape here and attempted to restart X2 unsuccessfully in RL arm. She has updated Dr Conrad [place] Lissa Hoard has left) and he said he would be by to assess.

## 2016-04-29 NOTE — Anesthesia Procedure Notes (Signed)
Procedure Name: Intubation Date/Time: 04/29/2016 7:50 AM Performed by: Kyung Rudd Pre-anesthesia Checklist: Patient identified, Emergency Drugs available, Suction available, Patient being monitored and Timeout performed Patient Re-evaluated:Patient Re-evaluated prior to inductionOxygen Delivery Method: Circle system utilized Preoxygenation: Pre-oxygenation with 100% oxygen Intubation Type: IV induction Ventilation: Mask ventilation without difficulty Laryngoscope Size: Glidescope and 4 Grade View: Grade I Tube type: Oral Tube size: 7.0 mm Number of attempts: 1 Airway Equipment and Method: Stylet Placement Confirmation: ETT inserted through vocal cords under direct vision,  positive ETCO2 and breath sounds checked- equal and bilateral Secured at: 21 cm Tube secured with: Tape Dental Injury: Injury to lip

## 2016-04-29 NOTE — Transfer of Care (Signed)
Immediate Anesthesia Transfer of Care Note  Patient: [name]  Procedure(s) Performed: Procedure(s): Reverse TOTAL SHOULDER ARTHROPLASTY (Left)  Patient Location: PACU  Anesthesia Type:GA combined with regional for post-op pain  Level of Consciousness: awake, alert  and oriented  Airway & Oxygen Therapy: Patient Spontanous Breathing and Patient connected to nasal cannula oxygen  Post-op Assessment: Report given to RN, Post -op Vital signs reviewed and stable and Patient moving all extremities  Post vital signs: Reviewed and stable  Last Vitals:  Vitals:   04/29/16 0621  BP: 132/62  Pulse: 77  Resp: 20  Temp: 37 C    Last Pain:  Vitals:   04/29/16 0621  TempSrc: Oral         Complications: No apparent anesthesia complications

## 2016-04-29 NOTE — Progress Notes (Signed)
Called Dr Katherine Basset is unable to come see pt. Dr R. Ola Spurr here to assess PIV. Fluids run well and pt says it is  not causing any discomfort. No increase in site puffiness. Will cont to monitor site and OK to tx to floor per Dr Ola Spurr. Pt educated about letting RN staff on floor know if there is discomfort & she verbalizes understanding.

## 2016-04-30 LAB — BASIC METABOLIC PANEL
Anion gap: 12 (ref 5–15)
BUN: 15 mg/dL (ref 6–20)
CALCIUM: 8.6 mg/dL — AB (ref 8.9–10.3)
CO2: 25 mmol/L (ref 22–32)
CREATININE: 1.28 mg/dL — AB (ref 0.44–1.00)
Chloride: 103 mmol/L (ref 101–111)
GFR calc Af Amer: 49 mL/min — ABNORMAL LOW (ref >60)
GFR, EST NON AFRICAN AMERICAN: 43 mL/min — AB (ref >60)
Glucose, Bld: 117 mg/dL — ABNORMAL HIGH (ref 65–99)
POTASSIUM: 3.5 mmol/L (ref 3.5–5.1)
SODIUM: 140 mmol/L (ref 135–145)

## 2016-04-30 LAB — HEMOGLOBIN AND HEMATOCRIT, BLOOD
HEMATOCRIT: 35.9 % — AB (ref 36.0–46.0)
Hemoglobin: 10.9 g/dL — ABNORMAL LOW (ref 12.0–15.0)

## 2016-04-30 MED ORDER — HYDROCODONE-ACETAMINOPHEN 5-325 MG PO TABS
1.0000 | ORAL_TABLET | ORAL | Status: DC | PRN
  Administered 2016-04-30 (×2): 1 via ORAL
  Administered 2016-04-30 – 2016-05-02 (×6): 2 via ORAL
  Filled 2016-04-30: qty 2
  Filled 2016-04-30: qty 1
  Filled 2016-04-30 (×4): qty 2
  Filled 2016-04-30: qty 1
  Filled 2016-04-30 (×2): qty 2

## 2016-04-30 NOTE — Progress Notes (Signed)
Reported to me patient refused CPAP for the night and was explained if she changed her mind to just call and we could set her up with CPAP no porblem

## 2016-04-30 NOTE — Evaluation (Signed)
Occupational Therapy Evaluation Patient Details Name: [name] MRN: [id number] DOB: October 25, 1950 Today's Date: 04/30/2016    History of Present Illness s/p L TOTAL SHOULDER ARTHROPLASTY. PMH includes, but not limited to, a-fib, BLE edema, obesity, CVA with residual LLE weakness, neuromuscular disorder.     Clinical Impression   Pt admitted with the above diagnoses and presents with below problem list. Pt will benefit from continued acute OT to address the below listed deficits and maximize independence with basic ADLs prior to d/c to venue below. PTA pt was mod I with ADLs. Pt is currently min guard for functional mobility, min to mod A with UB/LB ADLs and functional transfers. Of note, pt O2 dropped to 84-86 on RA, reading taken at end of session. O2 via [place] at 1.5L reapplied with O2 recovering to 95.       Follow Up Recommendations  SNF    Equipment Recommendations  Other (comment) (defer to next venue)    Recommendations for Other Services       Precautions / Restrictions Precautions Precautions: Shoulder;Fall Shoulder Interventions: Shoulder sling/immobilizer;At all times;Off for dressing/bathing/exercises Precaution Booklet Issued: Yes (comment) Required Braces or Orthoses: Sling Restrictions Weight Bearing Restrictions: Yes LUE Weight Bearing: Non weight bearing      Mobility Bed Mobility               General bed mobility comments: received in recliner  Transfers Overall transfer level: Needs assistance Equipment used: Straight cane Transfers: Sit to/from Stand Sit to Stand: Min assist;Min guard         General transfer comment: from recliner. Extra time and effort.     Balance Overall balance assessment: Needs assistance Sitting-balance support: No upper extremity supported;Feet supported       Standing balance support: Single extremity supported Standing balance-Leahy Scale: Poor Standing balance comment: cane at baseline                             ADL Overall ADL's : Needs assistance/impaired Eating/Feeding: Set up;Sitting   Grooming: Minimal assistance;Set up;Sitting   Upper Body Bathing: Moderate assistance;Sitting   Lower Body Bathing: Moderate assistance;Sit to/from stand   Upper Body Dressing : Moderate assistance;Sitting   Lower Body Dressing: Moderate assistance;Sit to/from stand   Toilet Transfer: Min guard;Ambulation;Minimal assistance (cane)   Toileting- Clothing Manipulation and Hygiene: Sitting/lateral lean;Sit to/from stand;Minimal assistance;Set up   Tub/ Shower Transfer: Minimal assistance;Ambulation   Functional mobility during ADLs: Min guard;Cane General ADL Comments: Pt completed in-room functional mobility using cane at min guard level. ADL and sling education provided including handout. Educated on NWB LUE. Pt noted to sit in recliner with L elbow resting on armrest (body habitus a factor) with pt reporting that she was not bearing any weight through L arm. Educated on no ROM in L shoulder unless she heard differently from MD.     Vision     Perception     Praxis      Pertinent Vitals/Pain Pain Assessment: Faces Faces Pain Scale: Hurts even more Pain Location: L shoulder Pain Descriptors / Indicators: Aching;Sore Pain Intervention(s): Limited activity within patient's tolerance;Monitored during session;Repositioned     Hand Dominance Left   Extremity/Trunk Assessment Upper Extremity Assessment Upper Extremity Assessment: LUE deficits/detail LUE Deficits / Details:   s/p L TOTAL SHOULDER ARTHROPLASTY   Lower Extremity Assessment Lower Extremity Assessment: Defer to PT evaluation       Communication Communication Communication: No difficulties  Cognition Arousal/Alertness: Awake/alert Behavior During Therapy: WFL for tasks assessed/performed Overall Cognitive Status: Within Functional Limits for tasks assessed                     General Comments        Exercises Exercises: Other exercises Other Exercises Other Exercises: e/w/h exercises in seated position. 10 reps, good ROM.    Shoulder Instructions      Home Living Family/patient expects to be discharged to:: Skilled nursing facility Living Arrangements: Alone                                      Prior Functioning/Environment Level of Independence: Independent with assistive device(s)        Comments: cane    OT Diagnosis: Acute pain   OT Problem List: Impaired balance (sitting and/or standing);Decreased knowledge of use of DME or AE;Decreased knowledge of precautions;Obesity;Pain;Impaired UE functional use   OT Treatment/Interventions: Self-care/ADL training;DME and/or AE instruction;Therapeutic activities;Balance training;Patient/family education    OT Goals(Current goals can be found in the care plan section) Acute Rehab OT Goals Patient Stated Goal: SNF for rehab before home OT Goal Formulation: With patient Time For Goal Achievement: 05/07/16 Potential to Achieve Goals: Good ADL Goals Pt Will Perform Upper Body Bathing: with modified independence;sitting Pt Will Perform Lower Body Bathing: with modified independence;with adaptive equipment;sit to/from stand Pt Will Perform Upper Body Dressing: with modified independence;sitting Pt Will Perform Lower Body Dressing: with modified independence;sit to/from stand;with adaptive equipment Pt Will Transfer to Toilet: with modified independence;ambulating Pt Will Perform Toileting - Clothing Manipulation and hygiene: with modified independence;sit to/from stand;sitting/lateral leans Pt/caregiver will Perform Home Exercise Program: With written HEP provided;Independently (e/w/h, neck ROM exercises)  OT Frequency: Min 3X/week   Barriers to D/C:            Co-evaluation PT/OT/SLP Co-Evaluation/Treatment: Yes Reason for Co-Treatment: For patient/therapist safety   OT goals addressed during session:  ADL's and self-care      End of Session Equipment Utilized During Treatment: Gait belt;Other (comment) (sling, cane) Nurse Communication: Other (comment) (O2 stats)  Activity Tolerance: Patient tolerated treatment well Patient left: in chair;with call bell/phone within reach   Time: 1150-1218 OT Time Calculation (min): 28 min Charges:  OT General Charges $OT Visit: 1 Procedure OT Evaluation $OT Eval Low Complexity: 1 Procedure G-Codes:    Hortencia Pilar 05/29/2016, 2:01 PM

## 2016-04-30 NOTE — Op Note (Signed)
NAMEJODELL, WEITMAN NO.:  [id number]  MEDICAL RECORD NO.:  [id number]  LOCATION:  5N24C                        FACILITY:  [facility]  PHYSICIAN:  Doran Heater. [name], M.D. DATE OF BIRTH:  [birth year]  DATE OF PROCEDURE:  04/29/2016 DATE OF DISCHARGE:                              OPERATIVE REPORT   PREOPERATIVE DIAGNOSIS:  End-stage arthritis, left shoulder.  POSTOPERATIVE DIAGNOSES:  End-stage arthritis, left shoulder as well as rotator cuff insufficiency.  PROCEDURE PERFORMED:  Left shoulder reversed total shoulder arthroplasty using DePuy Delta Xtend prosthesis.  ATTENDING SURGEON:  Doran Heater. [name], MD.  ASSISTANT:  Ventura Bruns, PA-C, who was scrubbed the entire procedure and necessary for satisfactory completion of surgery.  ANESTHESIA:  General anesthesia was used plus interscalene block.  ESTIMATED BLOOD LOSS:  300 mL.  FLUID REPLACEMENT:  1500 mL crystalloid.  INSTRUMENT COUNTS:  Correct.  COMPLICATIONS:  There were no complications.  ANTIBIOTICS:  Perioperative antibiotics were given.  INDICATIONS:  The patient is a 65 year old female, with progressively worse left shoulder pain significantly compromising her quality of life. The patient complains debilitating pain, unrelieved now with injections and modification of activity and pain medication.  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  With her general medical health, she was cleared by Cardiology and Internal Medicine for surgery.  The patient is normally on Xarelto.  She presents now for total shoulder arthroplasty to restore function to her shoulder and eliminate her pain.  Informed consent obtained.  DESCRIPTION OF PROCEDURE:  After an adequate level of anesthesia achieved, the patient was positioned in the modified beach-chair position.  Left shoulder was correctly  identified, sterilely prepped and draped in usual manner.  Time-out called.  After our sterile prep and drape and time-out, we initiated shoulder surgery using standard deltopectoral approach starting at the coracoid process, extending down to the anterior humerus.  Dissection down through subcutaneous tissues. The cephalic vein was identified and taken laterally with the deltoid, pectoralis taken medially.  Conjoined tendon identified and retracted medially.  We placed our deep retractors.  We then released the subscapularis subperiosteally off the lesser tuberosity.  Of note, the rotator cuff was attenuated and torn.  There were multiple areas of tearing and really almost looked necrotic in nature.  This was grayish in appearance and very unhealthy tissue.  There was extensive synovitis throughout the shoulder and also calcified tissue, but we did feel the subscap was repairable, so was tagged for repair at the end.  We retracted that and removed the capsule off the inferior humeral neck. We then externally rotated the shoulder delivering the humeral head of the wound.  The patient's bone was extremely hard due to the osteopetrosis and we had trouble initiating our canal entry proximally, but eventually used a drill bit and then increased the size with the drill bit up until we could get reamers in.  We were able to ream up to a size 8 with extreme difficulty and having to clear the bed every few turns, but we were able to eventually reamed up to a size  8 for the DePuy Reverse Shoulder System.  Once we noted there was rotator cuff insufficiency, we moved toward the reverse replacement.  We placed our intramedullary resection guide.  We then resected the head with an oscillating saw 10 degrees of retroversion.  We then removed excess osteophytes and then used the metaphyseal reamer and reamed the metaphysis for epi-1 left.  We then impacted the trial prosthesis, retracted the shoulder  posteriorly, removed excess degenerative rotator cuff tissue, so would not create a soft tissue impingement.  Teres minor was left intact.  We then went ahead and cleared away the glenoid labrum and the capsule and removed that.  We were careful to protect the axillary nerve.  We retracted the shoulder posteriorly, found our center point on her glenoid face which was extremely worn and there were some erosions inferiorly, but we found a good inferior scapular bone, placed our centered metaglene right on top of that and placed.  Initially, I had to drill it with a drill bit rather than just the trocar guide, but then we slipped the trocar guide pin after we drilled the 2.7 drill bit. We were happy with our placement of our pin.  We then went ahead and reamed which actually went fairly smoothly and got into some decent little bleeding bone there, but did not compromise the subchondral bone and then we drilled which was very difficult the central peg hole and then impacted the metaglene in position.  We had a nice 42 screw inferiorly, 30 proximally, and then 18 anteriorly, but could not get 1 posteriorly with very good support for the metaglene.  We then took a 38 eccentric dialing anterior inferiorly and impacted and screwed, so we had a nice glenosphere location inferiorly with good coverage, bony coverage.  We then went ahead and trialed with a 38+ 3 and then went up to a 38+ 6 and felt like we could probably get a +9.  We removed the trial components from the humeral side.  We did a hybrid prosthesis using a pore coat 8 stem with a HA size 1 epi-1 left metaphysis and was placed on the 0 setting and placed in 10 degrees of retroversion.  We impacted that in position after we had placed drill holes in place, sutures for repairing the subscap which will help the patient.  She has a high BMI and relies on her shoulders and her arms to get out of a chair, so we felt like she would definitely  need the subscap for pushing up, so we did go and repaired anatomically back to the lesser tuberosity through drill holes in the bone.  We had an excellent repair.  We were able to get 30 degrees of external rotation after the repair and no impingement noted under the coracoid or conjoint tendon.  We irrigated the entire wound thoroughly.  There was just generalized weeping throughout the surgery and so we lost about 300 mL of blood but at this point, we felt like the bleeding was really minimal and did not need a drain.  We then did our deltopectoral repair with 0 Vicryl suture followed by 2-0 Vicryl for subcutaneous closure and 4-0 Monocryl for skin.  Steri-Strips applied followed by sterile dressing.  The patient tolerated the surgery well.     Doran Heater. [name], M.D.     SRN/MEDQ  D:  04/29/2016  T:  04/30/2016  Job:  [id number]

## 2016-04-30 NOTE — Progress Notes (Signed)
Subjective: 1 Day Post-Op Procedure(s) (LRB): Reverse TOTAL SHOULDER ARTHROPLASTY (Left) Patient reports pain as moderate.  Soreness this morning about the shoulder. No other c/o. Ultram does not seem to be enough and she does not want to take morphine.  Objective: Vital signs in last 24 hours: Temp:  [97.6 F (36.4 C)-98.4 F (36.9 C)] 97.6 F (36.4 C) (08/26 0500) Pulse Rate:  [59-104] 92 (08/26 0500) Resp:  [18-24] 18 (08/26 0500) BP: (94-105)/(51-84) 98/62 (08/26 0500) SpO2:  [73 %-98 %] 94 % (08/26 0500) Arterial Line BP: (84-102)/(55-58) 84/55 (08/25 1145)  Intake/Output from previous day: 08/25 0701 - 08/26 0700 In: 1444.2 [P.O.:200; I.V.:1194.2; IV Piggyback:50] Out: 300 [Blood:300] Intake/Output this shift: No intake/output data recorded.   Recent Labs  04/30/16 0541  HGB 10.9*    Recent Labs  04/30/16 0541  HCT 35.9*    Recent Labs  04/30/16 0541  NA 140  K 3.5  CL 103  CO2 25  BUN 15  CREATININE 1.28*  GLUCOSE 117*  CALCIUM 8.6*   No results for input(s): LABPT, INR in the last 72 hours.  Neurologically intact ABD soft Neurovascular intact Sensation intact distally Intact pulses distally Dorsiflexion/Plantar flexion intact Incision: dressing C/D/I and no drainage No cellulitis present Compartment soft no sign of DVT  Assessment/Plan: 1 Day Post-Op Procedure(s) (LRB): Reverse TOTAL SHOULDER ARTHROPLASTY (Left) Advance diet Up with therapy D/C IV fluids  Ultram not controlling pain, will add Norco PT/OT today Continue cardiac monitoring for now Pt plan to D/C to SNF  BISSELL, JACLYN M. 04/30/2016, 9:22 AM

## 2016-04-30 NOTE — Evaluation (Signed)
Physical Therapy Evaluation Patient Details Name: [name] MRN: [id number] DOB: [birth year] Today's Date: 04/30/2016   History of Present Illness  Pt is a 65 y/o female s/p L TOTAL SHOULDER ARTHROPLASTY. PMH includes, but not limited to, a-fib, BLE edema, obesity, CVA with residual LLE weakness, neuromuscular disorder.    Clinical Impression  Pt presented sitting OOB in recliner when PT entered room. Pt was awake and willing to participate in therapy session. Pt reported that prior to admission she was independent with ADLs and used a SPC to ambulate. Pt would continue to benefit from skilled physical therapy services at this time while admitted and after d/c to address her below listed limitations in order to improve her overall safety and independence with functional mobility.     Follow Up Recommendations SNF    Equipment Recommendations  None recommended by PT;Other (comment) (defer to next venue)    Recommendations for Other Services       Precautions / Restrictions Precautions Precautions: Shoulder;Fall Shoulder Interventions: Shoulder sling/immobilizer;At all times;Off for dressing/bathing/exercises Precaution Booklet Issued: Yes (comment) Required Braces or Orthoses: Sling Restrictions Weight Bearing Restrictions: Yes LUE Weight Bearing: Non weight bearing      Mobility  Bed Mobility               General bed mobility comments: pt sitting OOB in recliner when PT entered room  Transfers Overall transfer level: Needs assistance Equipment used: Straight cane Transfers: Sit to/from Stand Sit to Stand: Min assist;Min guard         General transfer comment: pt required increased time and effort  Ambulation/Gait Ambulation/Gait assistance: Min guard Ambulation Distance (Feet): 25 Feet Assistive device: Straight cane Gait Pattern/deviations: Step-through pattern;Decreased stride length Gait velocity: decreased   General Gait Details: pt required VC'ing  for safety with cane  Stairs            Wheelchair Mobility    Modified Rankin (Stroke Patients Only)       Balance Overall balance assessment: Needs assistance Sitting-balance support: Feet supported;No upper extremity supported Sitting balance-Leahy Scale: Fair     Standing balance support: Single extremity supported;During functional activity Standing balance-Leahy Scale: Poor Standing balance comment: cane at baseline                             Pertinent Vitals/Pain Pain Assessment: Faces Faces Pain Scale: Hurts even more Pain Location: L shoulder Pain Descriptors / Indicators: Aching;Sore Pain Intervention(s): Monitored during session;Limited activity within patient's tolerance    Home Living Family/patient expects to be discharged to:: Skilled nursing facility Living Arrangements: Alone                    Prior Function Level of Independence: Independent with assistive device(s)         Comments: cane     Hand Dominance   Dominant Hand: Left    Extremity/Trunk Assessment   Upper Extremity Assessment: Defer to OT evaluation         Lower Extremity Assessment: RLE deficits/detail;LLE deficits/detail RLE Deficits / Details: MMT revealed 4/5 for hip flexion, hip abduction, hip adduction, knee flexion, knee extension and ankle DF. Sensation grossly intact. LLE Deficits / Details: MMT revealed 4/5 for hip flexion, hip abduction, hip adduction, knee flexion, knee extension and ankle DF.  Cervical / Trunk Assessment: Normal  Communication   Communication: No difficulties  Cognition Arousal/Alertness: Awake/alert Behavior During Therapy: WFL for tasks assessed/performed  Overall Cognitive Status: Within Functional Limits for tasks assessed                      General Comments    Exercises      Assessment/Plan    PT Assessment Patient needs continued PT services  PT Diagnosis Difficulty walking;Generalized  weakness   PT Problem List Decreased strength;Decreased activity tolerance;Decreased balance;Decreased mobility;Decreased coordination;Pain  PT Treatment Interventions DME instruction;Gait training;Functional mobility training;Therapeutic activities;Balance training;Therapeutic exercise;Neuromuscular re-education;Patient/family education   PT Goals (Current goals can be found in the Care Plan section) Acute Rehab PT Goals Patient Stated Goal: SNF for rehab before home PT Goal Formulation: With patient Time For Goal Achievement: 05/07/16 Potential to Achieve Goals: Good    Frequency Min 3X/week   Barriers to discharge        Co-evaluation PT/OT/SLP Co-Evaluation/Treatment: Yes Reason for Co-Treatment: For patient/therapist safety PT goals addressed during session: Mobility/safety with mobility;Balance;Proper use of DME;Strengthening/ROM OT goals addressed during session: ADL's and self-care       End of Session Equipment Utilized During Treatment: Gait belt;Other (comment) (L UE sling) Activity Tolerance: Patient limited by fatigue Patient left: in chair;with call bell/phone within reach;Other (comment) (OT remained in room to finish her evaluation) Nurse Communication: Mobility status         Time: 0762-2633 PT Time Calculation (min) (ACUTE ONLY): 18 min   Charges:   PT Evaluation $PT Eval Moderate Complexity: 1 Procedure     PT G CodesClearnce Sorrel Sidharth Leverette 04/30/2016, 2:49 PM Sherie Don, [place], DPT [phone]

## 2016-05-01 NOTE — Discharge Instructions (Signed)
NO WEIGHT BEARING WITH THE LEFT UPPER EXTREMITY  May remove the sling and use the arm for very gentle daily activity.  Gentle exercises three times per day.  Ice to the shoulder as much as you can.  Keep the left shoulder incision clean and dry and intact for one week, then ok to get it wet in the shower.  Call the office for follow up appointment in two weeks  [phone]  Information on my medicine - XARELTO (Rivaroxaban)  This medication education was reviewed with me or my healthcare representative as part of my discharge preparation.  The pharmacist that spoke with me during my hospital stay was:  Einar Grad, [facility]  Why was Xarelto prescribed for you? Xarelto was prescribed for you to reduce the risk of a blood clot forming that can cause a stroke if you have a medical condition called atrial fibrillation (a type of irregular heartbeat).  What do you need to know about xarelto ? Take your Xarelto ONCE DAILY at the same time every day with your evening meal. If you have difficulty swallowing the tablet whole, you may crush it and mix in applesauce just prior to taking your dose.  Take Xarelto exactly as prescribed by your doctor and DO NOT stop taking Xarelto without talking to the doctor who prescribed the medication.  Stopping without other stroke prevention medication to take the place of Xarelto may increase your risk of developing a clot that causes a stroke.  Refill your prescription before you run out.  After discharge, you should have regular check-up appointments with your healthcare provider that is prescribing your Xarelto.  In the future your dose may need to be changed if your kidney function or weight changes by a significant amount.  What do you do if you miss a dose? If you are taking Xarelto ONCE DAILY and you miss a dose, take it as soon as you remember on the same day then continue your regularly scheduled once daily regimen the next day. Do not take  two doses of Xarelto at the same time or on the same day.   Important Safety Information A possible side effect of Xarelto is bleeding. You should call your healthcare provider right away if you experience any of the following: ? Bleeding from an injury or your nose that does not stop. ? Unusual colored urine (red or dark brown) or unusual colored stools (red or black). ? Unusual bruising for unknown reasons. ? A serious fall or if you hit your head (even if there is no bleeding).  Some medicines may interact with Xarelto and might increase your risk of bleeding while on Xarelto. To help avoid this, consult your healthcare provider or pharmacist prior to using any new prescription or non-prescription medications, including herbals, vitamins, non-steroidal anti-inflammatory drugs (NSAIDs) and supplements.  This website has more information on Xarelto: [email or web address]

## 2016-05-01 NOTE — Progress Notes (Signed)
Patient refused CPAP tonight. There Isn't a machine in the room at this time. Explained to Patient that if they changed their mind, to just have the RN call Respiratory and we would come set them up.

## 2016-05-01 NOTE — Progress Notes (Signed)
Subjective: 2 Days Post-Op Procedure(s) (LRB): Reverse TOTAL SHOULDER ARTHROPLASTY (Left)  Still c/o moderate pain in the left shoulder Otherwise doing fairly well Plan for d/c to SNF tomorrow Patient reports pain as moderate.  Objective:   VITALS:   Vitals:   04/30/16 2040 05/01/16 0429  BP: 114/73 126/71  Pulse: 86 95  Resp: 16 16  Temp: 98.6 F (37 C) 99.4 F (37.4 C)    Left shoulder incision healing well nv intact distally No rashes  Minimal edema distally  LABS  Recent Labs  04/30/16 0541  HGB 10.9*  HCT 35.9*     Recent Labs  04/30/16 0541  NA 140  K 3.5  BUN 15  CREATININE 1.28*  GLUCOSE 117*     Assessment/Plan: 2 Days Post-Op Procedure(s) (LRB): Reverse TOTAL SHOULDER ARTHROPLASTY (Left) Continue PT/OT Pain management Plan for SNF tomorrow    Merla Riches, MPAS, PA-C  05/01/2016, 7:37 AM

## 2016-05-01 NOTE — Progress Notes (Signed)
Occupational Therapy Treatment Patient Details Name: [name] MRN: [id number] DOB: [birth year] Today's Date: 05/01/2016    History of present illness Pt is a 65 y/o female s/p L TOTAL SHOULDER ARTHROPLASTY. PMH includes, but not limited to, a-fib, BLE edema, obesity, CVA with residual LLE weakness, neuromuscular disorder.     OT comments  Pt progressing towards acute OT goals. Focus of session was ADL education, functional mobility, sling education, and elbow/wrist/hand exercises. Session details below. D/c plan remains appropriate.   Follow Up Recommendations  SNF    Equipment Recommendations  Other (comment) (defer to next venue)    Recommendations for Other Services      Precautions / Restrictions Precautions Precautions: Shoulder;Fall Shoulder Interventions: Shoulder sling/immobilizer;At all times;Off for dressing/bathing/exercises Required Braces or Orthoses: Sling Restrictions Weight Bearing Restrictions: Yes LUE Weight Bearing: Non weight bearing       Mobility Bed Mobility               General bed mobility comments: up in recliner  Transfers Overall transfer level: Needs assistance Equipment used: Straight cane Transfers: Sit to/from Stand Sit to Stand: Min assist;Min guard         General transfer comment: pt required increased time and effort. steadying assist during powerup    Balance Overall balance assessment: Needs assistance Sitting-balance support: No upper extremity supported;Feet supported Sitting balance-Leahy Scale: Fair     Standing balance support: Single extremity supported Standing balance-Leahy Scale: Poor Standing balance comment: cane at baseline                   ADL Overall ADL's : Needs assistance/impaired                                     Functional mobility during ADLs: Min guard;Cane General ADL Comments: ADL and sling educaiton reviewed. Functional mobility in room min guard with cane.        Vision                     Perception     Praxis      Cognition   Behavior During Therapy: [facility] for tasks assessed/performed Overall Cognitive Status: Within Functional Limits for tasks assessed                       Extremity/Trunk Assessment               Exercises Other Exercises Other Exercises: e/w/h exercises in seated position. 10 reps, good ROM.    Shoulder Instructions       General Comments      Pertinent Vitals/ Pain       Pain Assessment: Faces Faces Pain Scale: Hurts little more Pain Location: L shoulder at end of session Pain Descriptors / Indicators: Aching;Sore Pain Intervention(s): Limited activity within patient's tolerance;Monitored during session;Repositioned  Home Living                                          Prior Functioning/Environment              Frequency Min 3X/week     Progress Toward Goals  OT Goals(current goals can now be found in the care plan section)  Progress towards OT goals: Progressing toward goals  Acute Rehab  OT Goals Patient Stated Goal: SNF for rehab before home OT Goal Formulation: With patient Time For Goal Achievement: 05/07/16 Potential to Achieve Goals: Good ADL Goals Pt Will Perform Upper Body Bathing: with modified independence;sitting Pt Will Perform Lower Body Bathing: with modified independence;with adaptive equipment;sit to/from stand Pt Will Perform Upper Body Dressing: with modified independence;sitting Pt Will Perform Lower Body Dressing: with modified independence;sit to/from stand;with adaptive equipment Pt Will Transfer to Toilet: with modified independence;ambulating Pt Will Perform Toileting - Clothing Manipulation and hygiene: with modified independence;sit to/from stand;sitting/lateral leans Pt/caregiver will Perform Home Exercise Program: With written HEP provided;Independently  Plan Discharge plan remains appropriate     Co-evaluation                 End of Session Equipment Utilized During Treatment: Gait belt;Other (comment) (sling, cane)   Activity Tolerance Patient tolerated treatment well   Patient Left in chair;with call bell/phone within reach   Nurse Communication          Time: 0315-9458 OT Time Calculation (min): 23 min  Charges: OT Treatments $Self Care/Home Management : 23-37 mins  Hortencia Pilar 05/01/2016, 12:49 PM

## 2016-05-02 ENCOUNTER — Encounter (HOSPITAL_COMMUNITY): Payer: Self-pay | Admitting: Orthopedic Surgery

## 2016-05-02 DIAGNOSIS — S499 Unspecified injury of shoulder and upper arm: Secondary | ICD-10-CM | POA: Diagnosis not present

## 2016-05-02 DIAGNOSIS — E669 Obesity, unspecified: Secondary | ICD-10-CM | POA: Diagnosis not present

## 2016-05-02 DIAGNOSIS — M6281 Muscle weakness (generalized): Secondary | ICD-10-CM | POA: Diagnosis not present

## 2016-05-02 DIAGNOSIS — G894 Chronic pain syndrome: Secondary | ICD-10-CM | POA: Diagnosis not present

## 2016-05-02 DIAGNOSIS — Z7409 Other reduced mobility: Secondary | ICD-10-CM | POA: Diagnosis not present

## 2016-05-02 DIAGNOSIS — Z471 Aftercare following joint replacement surgery: Secondary | ICD-10-CM | POA: Diagnosis not present

## 2016-05-02 DIAGNOSIS — R531 Weakness: Secondary | ICD-10-CM | POA: Diagnosis not present

## 2016-05-02 DIAGNOSIS — Z96612 Presence of left artificial shoulder joint: Secondary | ICD-10-CM | POA: Diagnosis not present

## 2016-05-02 DIAGNOSIS — R2681 Unsteadiness on feet: Secondary | ICD-10-CM | POA: Diagnosis not present

## 2016-05-02 DIAGNOSIS — R278 Other lack of coordination: Secondary | ICD-10-CM | POA: Diagnosis not present

## 2016-05-02 DIAGNOSIS — R262 Difficulty in walking, not elsewhere classified: Secondary | ICD-10-CM | POA: Diagnosis not present

## 2016-05-02 NOTE — Care Management Important Message (Signed)
Important Message  Patient Details  Name: [name] MRN: [id number] Date of Birth: 1951-04-14   Medicare Important Message Given:  Yes    Orbie Pyo 05/02/2016, 12:13 PM

## 2016-05-02 NOTE — Progress Notes (Signed)
Physical Therapy Treatment Patient Details Name: [name] MRN: [id number] DOB: [birth year] Today's Date: 05/02/2016    History of Present Illness Pt is a 65 y/o female s/p L TOTAL SHOULDER ARTHROPLASTY. PMH includes, but not limited to, a-fib, BLE edema, obesity, CVA with residual LLE weakness, neuromuscular disorder.      PT Comments    Pt performed short gait trial and seated exercises.  Pt fatigues quickly and reports back pain.  Pt required re-adjustment and re-education of sling placement for optimal support.    Follow Up Recommendations  SNF     Equipment Recommendations  None recommended by PT;Other (comment)    Recommendations for Other Services       Precautions / Restrictions Precautions Precautions: Shoulder;Fall Shoulder Interventions: Shoulder sling/immobilizer;At all times;Off for dressing/bathing/exercises Precaution Comments: MD reports patient does not have to wear immobilizer waist strap.   Required Braces or Orthoses: Sling Restrictions Weight Bearing Restrictions: Yes LUE Weight Bearing: Non weight bearing    Mobility  Bed Mobility               General bed mobility comments: up in recliner  Transfers Overall transfer level: Needs assistance Equipment used: Straight cane Transfers: Sit to/from Stand Sit to Stand: Min guard         General transfer comment: Pt required increased time.  Pt performed sit to stand x1 with cues to push from seated surface.    Ambulation/Gait Ambulation/Gait assistance: Min guard Ambulation Distance (Feet): 25 Feet (refused to ambulate outside of room.  ) Assistive device: Straight cane Gait Pattern/deviations: Step-through pattern;Decreased stride length Gait velocity: decreased Gait velocity interpretation: <1.8 ft/sec, indicative of risk for recurrent falls General Gait Details: Pt required cues for upper trunk control and increasing stride length.     Stairs            Wheelchair Mobility    Modified Rankin (Stroke Patients Only)       Balance     Sitting balance-Leahy Scale: Fair       Standing balance-Leahy Scale: Poor                      Cognition Arousal/Alertness: Awake/alert Behavior During Therapy: WFL for tasks assessed/performed Overall Cognitive Status: Within Functional Limits for tasks assessed                      Exercises General Exercises - Lower Extremity Long Arc Quad: AROM;Both;10 reps;Seated Heel Slides: AROM;Both;10 reps;Seated Toe Raises: AROM;Both;10 reps;Seated Heel Raises: AROM;Both;10 reps;Seated    General Comments        Pertinent Vitals/Pain Pain Assessment: 0-10 Faces Pain Scale: Hurts little more Pain Location: L shoulder and back Pain Descriptors / Indicators: Aching;Sore Pain Intervention(s): Monitored during session    Home Living                      Prior Function            PT Goals (current goals can now be found in the care plan section) Acute Rehab PT Goals Patient Stated Goal: SNF for rehab before home Potential to Achieve Goals: Good Progress towards PT goals: Progressing toward goals    Frequency  Min 3X/week    PT Plan Current plan remains appropriate    Co-evaluation             End of Session Equipment Utilized During Treatment: Gait belt Activity Tolerance: Patient limited by fatigue  Patient left: in chair;with call bell/phone within reach;Other (comment)     Time: 9574-7340 PT Time Calculation (min) (ACUTE ONLY): 24 min  Charges:  $Gait Training: 8-22 mins $Therapeutic Exercise: 8-22 mins                    G Codes:      Cristela Blue 05-24-2016, 12:52 PM  Governor Rooks, PTA pager [phone]

## 2016-05-02 NOTE — Progress Notes (Signed)
   Subjective: 3 Days Post-Op Procedure(s) (LRB): Reverse TOTAL SHOULDER ARTHROPLASTY (Left)  Pt feeling better today Ready for d/c to SNF Patient reports pain as mild.  Objective:   VITALS:   Vitals:   05/01/16 2146 05/02/16 0516  BP: 108/68 134/75  Pulse: 94 (!) 107  Resp: 16 18  Temp: 98.4 F (36.9 C) 98.1 F (36.7 C)    Left shoulder incision healing well nv intact distally No rashes  Skin tears healing  LABS  Recent Labs  04/30/16 0541  HGB 10.9*  HCT 35.9*     Recent Labs  04/30/16 0541  NA 140  K 3.5  BUN 15  CREATININE 1.28*  GLUCOSE 117*     Assessment/Plan: 3 Days Post-Op Procedure(s) (LRB): Reverse TOTAL SHOULDER ARTHROPLASTY (Left) Doing well D/c to SNF F/u in 2 weeks    Merla Riches, MPAS, PA-C  05/02/2016, 10:08 AM

## 2016-05-02 NOTE — Progress Notes (Addendum)
Called to give report to [facility] spoke with Arbie Cookey LPN, P-[facility]ar called for transportation

## 2016-05-02 NOTE — Clinical Social Work Note (Signed)
Patient will discharge today per MD order. Patient will discharge to: [facility] RN to call report prior to transportation to: [phone] Transportation: [facility]- to be scheduled after insurance authorization received  CSW sent discharge summary to SNF for review.    Nonnie Done, LCSW [phone]  Licensed Clinical Social Worker

## 2016-05-02 NOTE — NC FL2 (Signed)
[place] MEDICAID FL2 LEVEL OF CARE SCREENING TOOL     IDENTIFICATION  Patient Name: [name] Birthdate: Jan 08, 1951 Sex: female Admission Date (Current Location): 04/29/2016  [facility] and [place] Number:  Herbalist and Address:  [facility] [place] [address], [place], [place] [address]      Provider Number: [id number]  Attending Physician Name and Address:  Netta Cedars, MD  Relative Name and Phone Number:       Current Level of Care: Hospital Recommended Level of Care: [place] Prior Approval Number:    Date Approved/Denied:   PASRR Number: [id number]  Discharge Plan: SNF    Current Diagnoses: Patient Active Problem List   Diagnosis Date Noted  . S/P shoulder replacement 04/29/2016  . Bilateral lower extremity edema 09/08/2015  . Pedal edema 08/03/2015  . Bilateral edema of lower extremity 06/10/2015  . Multiple joint pain 04/22/2015  . Post-nasal drainage 03/18/2014  . Dyspnea and respiratory abnormality 03/06/2014  . Gout 01/20/2014  . Back pain 01/20/2014  . Visit for preventive health examination 01/20/2014  . Anemia, B12 deficiency 07/16/2013  . Metatarsal stress fracture of right foot 01/23/2013  . Foot tendinitis 12/19/2012  . Backache, unspecified 10/29/2012  . Abnormality of gait 10/29/2012  . Dyspnea 05/16/2012  . Abnormal LFTs 05/16/2012  . Hypertension 05/16/2012  . Abnormal x-ray 05/16/2012  . Vitamin B12 deficiency 05/16/2012  . Need for prophylactic vaccination and inoculation against influenza 05/16/2012  . Neurologic hoarseness 03/26/2012  . Hoarseness 03/26/2012  . B12 deficiency anemia 03/26/2012  . Shortness of breath 02/17/2012  . H/O 11/19/2011  . High risk medication use 11/19/2011  . Hx of falling 11/19/2011  . Hx of left knee surgery 11/19/2011  . Acute foot pain 10/13/2011  . Stress fracture of metatarsal bone 10/13/2011  . Prediabetes 10/02/2011  . 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 Foot pain, left 09/21/2011  . Chronic anticoagulation 06/28/2011  . History of stroke 05/29/2011  . Chronic atrial fibrillation ([place]) 01/21/2011  . OSA (obstructive sleep apnea) 01/21/2011  . LVH (left ventricular hypertrophy)   . CVA (cerebral vascular accident) ([place]) 11/19/2010  . OTHER SLEEP DISTURBANCES 10/05/2010  . Anemia 07/09/2010  . POSTNASAL DRIP 07/09/2010  . DJD (degenerative joint disease) 05/17/2010  . OSTEOPETROSIS 11/05/2009  . GLYCOSURIA 07/07/2008  . UNSPECIFIED VITAMIN D DEFICIENCY 12/10/2007  . RHINITIS 12/10/2007  . HEMATURIA UNSPECIFIED 11/07/2007  . ANEMIA, VITAMIN B12 DEFICIENCY NEC 06/18/2007  . HYPERLIPIDEMIA 04/09/2007  . FASTING HYPERGLYCEMIA 04/09/2007  . Hypothyroidism 02/28/2007  . Morbid obesity ([place]) 02/28/2007  . CARDIAC MURMUR 02/28/2007    Orientation RESPIRATION BLADDER Height & Weight     Self, Time, Situation, Place  Normal Incontinent Weight: 131.5 kg (290 lb) Height:  5' 1.5" (156.2 cm)  BEHAVIORAL SYMPTOMS/MOOD NEUROLOGICAL BOWEL NUTRITION STATUS      Continent Diet (Please see DC Summary)  AMBULATORY STATUS COMMUNICATION OF NEEDS Skin   Extensive Assist Verbally Surgical wounds (Closed incision on shoulder)                       Personal Care Assistance Level of Assistance  Bathing, Feeding, Dressing Bathing Assistance: Maximum assistance Feeding assistance: Independent Dressing Assistance: Maximum assistance     Functional Limitations Info             SPECIAL CARE FACTORS FREQUENCY  PT (By licensed PT)     PT Frequency: 5x/week  Contractures      Additional Factors Info  Code Status, Allergies Code Status Info: Full Allergies Info: Ambien Zolpidem Tartrate, Prevacid Lansoprazole, Ace Inhibitors, Adhesive Tape, Benadryl Diphenhydramine Hcl, Keflex Cephalexin, Losartan, Motrin Ibuprofen, Prilosec Omeprazole           Current Medications (05/02/2016):  This is the  current hospital active medication list Current Facility-Administered Medications  Medication Dose Route Frequency Provider Last Rate Last Dose  . 0.9 %  sodium chloride infusion   Intravenous Continuous Netta Cedars, MD 50 mL/hr at 04/29/16 1749    . allopurinol (ZYLOPRIM) tablet 200 mg  200 mg Oral Daily Netta Cedars, MD   200 mg at 05/01/16 1039  . atenolol (TENORMIN) tablet 50 mg  50 mg Oral Daily Netta Cedars, MD   50 mg at 05/01/16 1039   And  . atenolol (TENORMIN) tablet 25 mg  25 mg Oral q1800 Netta Cedars, MD   25 mg at 04/30/16 1717  . bisacodyl (DULCOLAX) suppository 10 mg  10 mg Rectal Daily PRN Netta Cedars, MD      . chlorhexidine (HIBICLENS) 4 % liquid 4 application  60 mL Topical Once Brad Dixon, PA-C      . cholecalciferol (VITAMIN D) tablet 5,000 Units  5,000 Units Oral Once per day on Mon Wed Fri Netta Cedars, MD   5,000 Units at 04/29/16 1348  . docusate sodium (COLACE) capsule 100 mg  100 mg Oral BID Netta Cedars, MD   100 mg at 05/01/16 2138  . furosemide (LASIX) tablet 20 mg  20 mg Oral BID Netta Cedars, MD   20 mg at 05/01/16 1740  . HYDROcodone-acetaminophen (NORCO/VICODIN) 5-325 MG per tablet 1-2 tablet  1-2 tablet Oral Q4H PRN Cecilie Kicks, PA-C   2 tablet at 05/01/16 1746  . levothyroxine (SYNTHROID, LEVOTHROID) tablet 75 mcg  75 mcg Oral QAC breakfast Netta Cedars, MD   75 mcg at 05/01/16 [phone]  . menthol-cetylpyridinium (CEPACOL) lozenge 3 mg  1 lozenge Oral PRN Netta Cedars, MD       Or  . phenol (CHLORASEPTIC) mouth spray 1 spray  1 spray Mouth/Throat PRN Netta Cedars, MD      . methocarbamol (ROBAXIN) tablet 500 mg  500 mg Oral Q6H PRN Netta Cedars, MD   500 mg at 05/02/16 9021   Or  . methocarbamol (ROBAXIN) 500 mg in dextrose 5 % 50 mL IVPB  500 mg Intravenous Q6H PRN Netta Cedars, MD      . metoCLOPramide (REGLAN) tablet 5-10 mg  5-10 mg Oral Q8H PRN Netta Cedars, MD       Or  . metoCLOPramide (REGLAN) injection 5-10 mg  5-10 mg Intravenous Q8H PRN Netta Cedars, MD      . morphine 2 MG/ML injection 2 mg  2 mg Intravenous Q2H PRN Netta Cedars, MD      . omega-3 acid ethyl esters (LOVAZA) capsule 2 g  2 g Oral Daily Netta Cedars, MD   2 g at 05/01/16 1039  . ondansetron (ZOFRAN) tablet 4 mg  4 mg Oral Q6H PRN Netta Cedars, MD       Or  . ondansetron [facility]) injection 4 mg  4 mg Intravenous Q6H PRN Netta Cedars, MD      . polyethylene glycol (MIRALAX / GLYCOLAX) packet 17 g  17 g Oral Daily PRN Netta Cedars, MD      . potassium chloride (K-DUR) CR tablet 10 mEq  10 mEq Oral BID Netta Cedars, MD  10 mEq at 05/01/16 2138  . rivaroxaban (XARELTO) tablet 20 mg  20 mg Oral Q supper Netta Cedars, MD   20 mg at 05/01/16 1740  . traMADol (ULTRAM) tablet 50-100 mg  50-100 mg Oral Q6H PRN Netta Cedars, MD   100 mg at 05/02/16 2919     Discharge Medications: Please see discharge summary for a list of discharge medications.  Relevant Imaging Results:  Relevant Lab Results:   Additional Information SSN: [place], [place]

## 2016-05-02 NOTE — Discharge Summary (Signed)
Physician Discharge Summary   Patient ID: [name] MRN: [id number] DOB/AGE: [birth year] 65 y.o.  Admit date: 04/29/2016 Discharge date: 05/02/2016  Admission Diagnoses:  Active Problems:   S/P shoulder replacement   Discharge Diagnoses:  Same   Surgeries: Procedure(s): Reverse TOTAL SHOULDER ARTHROPLASTY on 04/29/2016   Consultants: OT  Discharged Condition: Stable  Hospital Course: [name] is an 65 y.o. female who was admitted 04/29/2016 with a chief complaint of left shoulder pain, and found to have a diagnosis of left shoulder rotator cuff arthropathy.  They were brought to the operating room on 04/29/2016 and underwent the above named procedures.    The patient had an uncomplicated hospital course and was stable for discharge.  Recent vital signs:  Vitals:   05/01/16 2146 05/02/16 0516  BP: 108/68 134/75  Pulse: 94 (!) 107  Resp: 16 18  Temp: 98.4 F (36.9 C) 98.1 F (36.7 C)    Recent laboratory studies:  Results for orders placed or performed during the hospital encounter of 04/29/16  Hemoglobin and hematocrit, blood  Result Value Ref Range   Hemoglobin 10.9 (L) 12.0 - 15.0 g/dL   HCT 35.9 (L) 36.0 - 02.6 %  Basic metabolic panel  Result Value Ref Range   Sodium 140 135 - 145 mmol/L   Potassium 3.5 3.5 - 5.1 mmol/L   Chloride 103 101 - 111 mmol/L   CO2 25 22 - 32 mmol/L   Glucose, Bld 117 (H) 65 - 99 mg/dL   BUN 15 6 - 20 mg/dL   Creatinine, Ser 1.28 (H) 0.44 - 1.00 mg/dL   Calcium 8.6 (L) 8.9 - 10.3 mg/dL   GFR calc non Af Amer 43 (L) >60 mL/min   GFR calc Af Amer 49 (L) >60 mL/min   Anion gap 12 5 - 15    Discharge Medications:   Current Outpatient Prescriptions  Medication Sig Dispense Refill  . allopurinol (ZYLOPRIM) 100 MG tablet TAKE 2 TABLETS EVERY DAY 180 tablet 1  . atenolol (TENORMIN) 50 MG tablet Take 1 tablet by mouth in the morning and 1/2 tablet by mouth in the evening 135 tablet 3  . colchicine 0.6 MG tablet Take 0.6 mg by  mouth as needed. For gout    . diclofenac sodium (VOLTAREN) 1 % GEL Apply topically as needed (for arthritis).     . furosemide (LASIX) 40 MG tablet TAKE 1 TABLET BY MOUTH EVERY DAY FOR 3 DAYS THEN TAKE 1/2 TABLET BY MOUTH EVERY DAY 90 tablet 3  . KLOR-CON 10 10 MEQ tablet TAKE 1 TABLET (10 MEQ TOTAL) BY MOUTH DAILY. 30 tablet 3  . levothyroxine (SYNTHROID, LEVOTHROID) 75 MCG tablet Take 75 mcg by mouth daily before breakfast.    . levothyroxine (SYNTHROID, LEVOTHROID) 75 MCG tablet TAKE 1 TABLET EVERY DAY 90 tablet 1  . Omega-3 Fatty Acids (FISH OIL) 1000 MG CAPS Take 1,000 mg by mouth daily.    . rivaroxaban (XARELTO) 20 MG TABS tablet Take 1 tablet (20 mg total) by mouth daily with supper. 20 tablet 0  . traMADol (ULTRAM) 50 MG tablet TAKE 1 TABLET 3 TIMES A DAY AS NEEDED FOR PAIN 90 tablet 0  . XARELTO 20 MG TABS tablet TAKE 1 TABLET BY MOUTH EVERY DAY WITH SUPPER       Diagnostic Studies: Dg Shoulder Left Port  Result Date: 04/29/2016 CLINICAL DATA:  Left shoulder replacement. EXAM: LEFT SHOULDER - 1 VIEW COMPARISON:  None. FINDINGS: Status post left shoulder arthroplasty. Expected  edema and gas adjacent to the surgical site. No complicating fracture. Visualized portion of the left hemithorax is normal. There is widening of the acromioclavicular joint, mild, possibly postoperative. IMPRESSION: Expected appearance after left shoulder arthroplasty. Electronically Signed   By: Abigail Miyamoto M.D.   On: 04/29/2016 12:24    Disposition: SNF    Follow-up Information    Phil Corti,STEVEN R, MD. Call in 2 weeks.   Specialty:  Orthopedic Surgery Why:  [phone] Contact information: [address] [place] [address] [phone]            Signed: Ventura Bruns 05/02/2016, 10:10 AM

## 2016-05-02 NOTE — Progress Notes (Signed)
Occupational Therapy Treatment Patient Details Name: [name] MRN: [id number] DOB: 29-Oct-1950 Today's Date: 05/02/2016    History of present illness Pt is a 65 y/o female s/p L TOTAL SHOULDER ARTHROPLASTY. PMH includes, but not limited to, a-fib, BLE edema, obesity, CVA with residual LLE weakness, neuromuscular disorder.     OT comments  Pt making good progress with functional goals. OT will continue to follow  Follow Up Recommendations  SNF    Equipment Recommendations  Other (comment) (TBD)    Recommendations for Other Services      Precautions / Restrictions Precautions Precautions: Shoulder;Fall Shoulder Interventions: Shoulder sling/immobilizer;At all times;Off for dressing/bathing/exercises Precaution Comments: MD reports patient does not have to wear immobilizer waist strap.   Required Braces or Orthoses: Sling Restrictions Weight Bearing Restrictions: Yes LUE Weight Bearing: Non weight bearing       Mobility Bed Mobility               General bed mobility comments: up in recliner  Transfers Overall transfer level: Needs assistance Equipment used: Straight cane Transfers: Sit to/from Stand Sit to Stand: Min guard         General transfer comment: Pt required increased time.  Pt performed sit to stand x1 with cues to push from seated surface.      Balance     Sitting balance-Leahy Scale: Fair       Standing balance-Leahy Scale: Poor                     ADL Overall ADL's : Needs assistance/impaired     Grooming: Minimal assistance;Set up;Standing   Upper Body Bathing: Sitting;Minimal assitance Upper Body Bathing Details (indicate cue type and reason): able to verbalize and demo compensatory technique     Upper Body Dressing : Sitting;Minimal assistance Upper Body Dressing Details (indicate cue type and reason): able to verbalize and demo compensatory technique     Toilet Transfer: Min guard;Ambulation;Comfort height  toilet;Grab bars   Toileting- Clothing Manipulation and Hygiene: Sit to/from stand;Minimal assistance       Functional mobility during ADLs: Min guard;Cane General ADL Comments: ADL and sling educaiton and ROM exercises reviewed.       Vision  No change from baseline                              Cognition   Behavior During Therapy: WFL for tasks assessed/performed Overall Cognitive Status: Within Functional Limits for tasks assessed                       Extremity/Trunk Assessment   L UE impaired, immobilized in sling                Shoulder Instructions  reviewed sling education, ADL techniques and ROM exercises     General Comments  pt very pleasant and cooperative    Pertinent Vitals/ Pain       Pain Assessment: 0-10 Pain Score: 2  Faces Pain Scale: Hurts little more Pain Location: L shoulder Pain Descriptors / Indicators: Sore Pain Intervention(s): Monitored during session;Repositioned  Home Living  at home alone                                        Prior Functioning/Environment  independent  Frequency Min 3X/week     Progress Toward Goals  OT Goals(current goals can now be found in the care plan section)  Progress towards OT goals: Progressing toward goals  Acute Rehab OT Goals Patient Stated Goal: SNF for rehab before home  Plan Discharge plan remains appropriate                     End of Session Equipment Utilized During Treatment: Gait belt;Other (comment) (3 in 1)   Activity Tolerance Patient tolerated treatment well   Patient Left in chair;with call bell/phone within reach             Time: 2505-3976 OT Time Calculation (min): 25 min  Charges: OT General Charges $OT Visit: 1 Procedure OT Treatments $Self Care/Home Management : 8-22 mins $Therapeutic Activity: 8-22 mins  Britt Bottom 05/02/2016, 2:20 PM

## 2016-05-02 NOTE — Consult Note (Signed)
   [facility] Inpatient Consult   05/02/2016  MARTIKA EGLER 1950/09/28 [id number]   Patient screened for potential [facility] Care Management services. Chart reviewed. Noted discharge plan is for  SNF.  There are no identifiable [facility] Care Management needs at this time. If patient's post hospital needs change, please place a [facility] Care Management consult. For questions please contact:  Marthenia Rolling, [place], RN,BSN [facility] Liaison [phone]

## 2016-05-02 NOTE — Clinical Social Work Note (Signed)
Clinical Social Work Assessment  Patient Details  Name: [name] MRN: [id number] Date of Birth: [birth year]  Date of referral:  05/02/16               Reason for consult:  Facility Placement                Permission sought to share information with:  Facility Art therapist granted to share information::     Name::        Agency::  SNFs  Relationship::     Contact Information:     Housing/Transportation Living arrangements for the past 2 months:  Single Family Home Source of Information:  Patient Patient Interpreter Needed:  None Criminal Activity/Legal Involvement Pertinent to Current Situation/Hospitalization:  No - Comment as needed Significant Relationships:  Siblings, Adult Children Lives with:  Self Do you feel safe going back to the place where you live?  No Need for family participation in patient care:  Yes (Comment)  Care giving concerns:  CSW received referral for possible SNF placement at time of discharge. CSW met with patient at bedside regarding PT recommendation of SNF placement at time of discharge. Patient lives alone and is currently unable to care for herself at home given patient's current physical needs and fall risk. Patient expressed understanding of PT recommendation and is agreeable to SNF placement at time of discharge. CSW to continue to follow and assist with discharge planning needs.   Social Worker assessment / plan:  CSW spoke with patient concerning possibility of rehab at [facility] before returning home.  Employment status:  Retired Surveyor, [employer] Care PT Recommendations:  [place] / Referral to community resources:  [place]  Patient/Family's Response to care:  Patient recognizes need for rehab before returning home and is agreeable to a SNF in [place]. Patient reported preference for [facility] and has already been in contact with the  facility.  Patient/Family's Understanding of and Emotional Response to Diagnosis, Current Treatment, and Prognosis:  Patient/family is realistic regarding therapy needs and expressed being hopeful for SNF placement. No questions/concerns about plan or treatment.    Emotional Assessment Appearance:  Appears stated age Attitude/Demeanor/Rapport:  Other (Pleasant) Affect (typically observed):  Appropriate, Pleasant, Accepting Orientation:  Oriented to Self, Oriented to Situation, Oriented to Place, Oriented to  Time Alcohol / Substance use:  Not Applicable Psych involvement (Current and /or in the community):  No (Comment)  Discharge Needs  Concerns to be addressed:  Care Coordination Readmission within the last 30 days:  No Current discharge risk:  None Barriers to Discharge:  Continued Medical Work up   [employer], [place] 05/02/2016, 9:29 AM

## 2016-05-05 ENCOUNTER — Encounter: Payer: Self-pay | Admitting: Internal Medicine

## 2016-05-05 ENCOUNTER — Other Ambulatory Visit: Payer: Self-pay

## 2016-05-05 ENCOUNTER — Non-Acute Institutional Stay (SKILLED_NURSING_FACILITY): Payer: PPO | Admitting: Internal Medicine

## 2016-05-05 DIAGNOSIS — R2681 Unsteadiness on feet: Secondary | ICD-10-CM

## 2016-05-05 DIAGNOSIS — E039 Hypothyroidism, unspecified: Secondary | ICD-10-CM | POA: Diagnosis not present

## 2016-05-05 DIAGNOSIS — I10 Essential (primary) hypertension: Secondary | ICD-10-CM

## 2016-05-05 DIAGNOSIS — I482 Chronic atrial fibrillation, unspecified: Secondary | ICD-10-CM

## 2016-05-05 DIAGNOSIS — M19012 Primary osteoarthritis, left shoulder: Secondary | ICD-10-CM

## 2016-05-05 DIAGNOSIS — D62 Acute posthemorrhagic anemia: Secondary | ICD-10-CM

## 2016-05-05 DIAGNOSIS — E876 Hypokalemia: Secondary | ICD-10-CM | POA: Diagnosis not present

## 2016-05-05 DIAGNOSIS — Z8673 Personal history of transient ischemic attack (TIA), and cerebral infarction without residual deficits: Secondary | ICD-10-CM

## 2016-05-05 DIAGNOSIS — R600 Localized edema: Secondary | ICD-10-CM | POA: Diagnosis not present

## 2016-05-05 DIAGNOSIS — M25512 Pain in left shoulder: Secondary | ICD-10-CM

## 2016-05-05 MED ORDER — TRAMADOL HCL 50 MG PO TABS: ORAL_TABLET | ORAL | 0 refills | Status: DC

## 2016-05-05 NOTE — Telephone Encounter (Signed)
Rx faxed to [facility] @ [phone], phone number [phone]
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
## 2016-05-06 DIAGNOSIS — R2681 Unsteadiness on feet: Secondary | ICD-10-CM | POA: Diagnosis not present

## 2016-05-06 DIAGNOSIS — Z471 Aftercare following joint replacement surgery: Secondary | ICD-10-CM | POA: Diagnosis not present

## 2016-05-06 DIAGNOSIS — R262 Difficulty in walking, not elsewhere classified: Secondary | ICD-10-CM | POA: Diagnosis not present

## 2016-05-06 DIAGNOSIS — M6281 Muscle weakness (generalized): Secondary | ICD-10-CM | POA: Diagnosis not present

## 2016-05-06 DIAGNOSIS — Z96612 Presence of left artificial shoulder joint: Secondary | ICD-10-CM | POA: Diagnosis not present

## 2016-05-06 DIAGNOSIS — R278 Other lack of coordination: Secondary | ICD-10-CM | POA: Diagnosis not present

## 2016-05-06 NOTE — Telephone Encounter (Signed)
Documents completed and signed by Dr Gwenlyn Found. Faxed to number provided successfully.

## 2016-05-10 ENCOUNTER — Non-Acute Institutional Stay (SKILLED_NURSING_FACILITY): Payer: PPO | Admitting: Family

## 2016-05-10 DIAGNOSIS — E876 Hypokalemia: Secondary | ICD-10-CM

## 2016-05-10 DIAGNOSIS — E871 Hypo-osmolality and hyponatremia: Secondary | ICD-10-CM

## 2016-05-10 DIAGNOSIS — E46 Unspecified protein-calorie malnutrition: Secondary | ICD-10-CM | POA: Diagnosis not present
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
## 2016-05-13 ENCOUNTER — Other Ambulatory Visit: Payer: Self-pay | Admitting: Physician Assistant

## 2016-05-13 DIAGNOSIS — R945 Abnormal results of liver function studies: Secondary | ICD-10-CM

## 2016-05-13 DIAGNOSIS — Z7901 Long term (current) use of anticoagulants: Secondary | ICD-10-CM

## 2016-05-13 DIAGNOSIS — I482 Chronic atrial fibrillation, unspecified: Secondary | ICD-10-CM

## 2016-05-13 DIAGNOSIS — I10 Essential (primary) hypertension: Secondary | ICD-10-CM

## 2016-05-13 DIAGNOSIS — R0600 Dyspnea, unspecified: Secondary | ICD-10-CM

## 2016-05-13 DIAGNOSIS — R7989 Other specified abnormal findings of blood chemistry: Secondary | ICD-10-CM

## 2016-05-13 DIAGNOSIS — R600 Localized edema: Secondary | ICD-10-CM

## 2016-05-17 DIAGNOSIS — Z96612 Presence of left artificial shoulder joint: Secondary | ICD-10-CM | POA: Diagnosis not present

## 2016-05-18 ENCOUNTER — Non-Acute Institutional Stay (SKILLED_NURSING_FACILITY): Payer: PPO | Admitting: Family

## 2016-05-18 DIAGNOSIS — E039 Hypothyroidism, unspecified: Secondary | ICD-10-CM | POA: Diagnosis not present

## 2016-05-18 DIAGNOSIS — I10 Essential (primary) hypertension: Secondary | ICD-10-CM | POA: Diagnosis not present

## 2016-05-18 DIAGNOSIS — Z96612 Presence of left artificial shoulder joint: Secondary | ICD-10-CM | POA: Diagnosis not present

## 2016-05-18 DIAGNOSIS — M1000 Idiopathic gout, unspecified site: Secondary | ICD-10-CM

## 2016-05-18 DIAGNOSIS — R269 Unspecified abnormalities of gait and mobility: Secondary | ICD-10-CM

## 2016-05-18 DIAGNOSIS — I482 Chronic atrial fibrillation, unspecified: Secondary | ICD-10-CM
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
## 2016-05-24 ENCOUNTER — Ambulatory Visit: Payer: PPO | Admitting: Cardiovascular Disease

## 2016-05-24 DIAGNOSIS — Z96612 Presence of left artificial shoulder joint: Secondary | ICD-10-CM | POA: Diagnosis not present

## 2016-05-24 DIAGNOSIS — M5136 Other intervertebral disc degeneration, lumbar region: Secondary | ICD-10-CM | POA: Diagnosis not present

## 2016-05-24 DIAGNOSIS — I10 Essential (primary) hypertension: Secondary | ICD-10-CM | POA: Diagnosis not present

## 2016-05-24 DIAGNOSIS — Z79891 Long term (current) use of opiate analgesic: Secondary | ICD-10-CM | POA: Diagnosis not present

## 2016-05-24 DIAGNOSIS — Z8673 Personal history of transient ischemic attack (TIA), and cerebral infarction without residual deficits: Secondary | ICD-10-CM | POA: Diagnosis not present

## 2016-05-24 DIAGNOSIS — M47816 Spondylosis without myelopathy or radiculopathy, lumbar region: Secondary | ICD-10-CM | POA: Diagnosis not present

## 2016-05-24 DIAGNOSIS — E669 Obesity, unspecified: Secondary | ICD-10-CM | POA: Diagnosis not present

## 2016-05-24 DIAGNOSIS — Z471 Aftercare following joint replacement surgery: Secondary | ICD-10-CM | POA: Diagnosis not present

## 2016-05-24 DIAGNOSIS — M5134 Other intervertebral disc degeneration, thoracic region: Secondary | ICD-10-CM | POA: Diagnosis not present

## 2016-05-24 DIAGNOSIS — Z6841 Body Mass Index (BMI) 40.0 and over, adult: Secondary | ICD-10-CM | POA: Diagnosis not present

## 2016-05-24 DIAGNOSIS — Z7901 Long term (current) use of anticoagulants: Secondary | ICD-10-CM | POA: Diagnosis not present

## 2016-05-26 DIAGNOSIS — M47816 Spondylosis without myelopathy or radiculopathy, lumbar region: Secondary | ICD-10-CM | POA: Diagnosis not present

## 2016-05-26 DIAGNOSIS — I10 Essential (primary) hypertension: Secondary | ICD-10-CM | POA: Diagnosis not present

## 2016-05-26 DIAGNOSIS — M5134 Other intervertebral disc degeneration, thoracic region: Secondary | ICD-10-CM | POA: Diagnosis not present

## 2016-05-26 DIAGNOSIS — M5136 Other intervertebral disc degeneration, lumbar region: Secondary | ICD-10-CM | POA: Diagnosis not present

## 2016-05-26 DIAGNOSIS — E669 Obesity, unspecified: Secondary | ICD-10-CM | POA: Diagnosis not present

## 2016-05-26 DIAGNOSIS — Z7901 Long term (current) use of anticoagulants: Secondary | ICD-10-CM | POA: Diagnosis not present

## 2016-05-26 DIAGNOSIS — Z6841 Body Mass Index (BMI) 40.0 and over, adult: Secondary | ICD-10-CM | POA: Diagnosis not present

## 2016-05-26 DIAGNOSIS — Z8673 Personal history of transient ischemic attack (TIA), and cerebral infarction without residual deficits: Secondary | ICD-10-CM | POA: Diagnosis not present

## 2016-05-26 DIAGNOSIS — Z96612 Presence of left artificial shoulder joint: Secondary | ICD-10-CM | POA: Diagnosis not present

## 2016-05-26 DIAGNOSIS — Z471 Aftercare following joint replacement surgery: Secondary | ICD-10-CM | POA: Diagnosis not present

## 2016-05-26 DIAGNOSIS — Z79891 Long term (current) use of opiate analgesic: Secondary | ICD-10-CM | POA: Diagnosis not present

## 2016-05-27 ENCOUNTER — Other Ambulatory Visit: Payer: Self-pay | Admitting: *Deleted

## 2016-05-27 DIAGNOSIS — R269 Unspecified abnormalities of gait and mobility: Secondary | ICD-10-CM | POA: Diagnosis not present

## 2016-05-27 MED ORDER — POTASSIUM CHLORIDE ER 10 MEQ PO TBCR: 10.0000 meq | EXTENDED_RELEASE_TABLET | Freq: Every day | ORAL | 2 refills | Status: DC

## 2016-05-31 ENCOUNTER — Other Ambulatory Visit: Payer: Self-pay | Admitting: Cardiovascular Disease

## 2016-05-31 MED ORDER — POTASSIUM CHLORIDE ER 10 MEQ PO TBCR: 10.0000 meq | EXTENDED_RELEASE_TABLET | Freq: Every day | ORAL | 2 refills | Status: DC

## 2016-06-01 DIAGNOSIS — Z471 Aftercare following joint replacement surgery: Secondary | ICD-10-CM | POA: Diagnosis not present

## 2016-06-01 DIAGNOSIS — Z7901 Long term (current) use of anticoagulants: Secondary | ICD-10-CM | POA: Diagnosis not present

## 2016-06-01 DIAGNOSIS — I10 Essential (primary) hypertension: Secondary | ICD-10-CM | POA: Diagnosis not present

## 2016-06-01 DIAGNOSIS — Z6841 Body Mass Index (BMI) 40.0 and over, adult: Secondary | ICD-10-CM | POA: Diagnosis not present

## 2016-06-01 DIAGNOSIS — M47816 Spondylosis without myelopathy or radiculopathy, lumbar region: Secondary | ICD-10-CM | POA: Diagnosis not present

## 2016-06-01 DIAGNOSIS — M5134 Other intervertebral disc degeneration, thoracic region: Secondary | ICD-10-CM | POA: Diagnosis not present

## 2016-06-01 DIAGNOSIS — E669 Obesity, unspecified: Secondary | ICD-10-CM | POA: Diagnosis not present

## 2016-06-01 DIAGNOSIS — M5136 Other intervertebral disc degeneration, lumbar region: Secondary | ICD-10-CM | POA: Diagnosis not present

## 2016-06-01 DIAGNOSIS — Z8673 Personal history of transient ischemic attack (TIA), and cerebral infarction without residual deficits: Secondary | ICD-10-CM | POA: Diagnosis not present

## 2016-06-01 DIAGNOSIS — Z79891 Long term (current) use of opiate analgesic: Secondary | ICD-10-CM | POA: Diagnosis not present

## 2016-06-01 DIAGNOSIS — Z96612 Presence of left artificial shoulder joint: Secondary | ICD-10-CM | POA: Diagnosis not present

## 2016-06-07 DIAGNOSIS — M47816 Spondylosis without myelopathy or radiculopathy, lumbar region: Secondary | ICD-10-CM | POA: Diagnosis not present

## 2016-06-07 DIAGNOSIS — I10 Essential (primary) hypertension: Secondary | ICD-10-CM | POA: Diagnosis not present

## 2016-06-07 DIAGNOSIS — Z7901 Long term (current) use of anticoagulants: Secondary | ICD-10-CM | POA: Diagnosis not present

## 2016-06-07 DIAGNOSIS — M5136 Other intervertebral disc degeneration, lumbar region: Secondary | ICD-10-CM | POA: Diagnosis not present

## 2016-06-07 DIAGNOSIS — E669 Obesity, unspecified: Secondary | ICD-10-CM | POA: Diagnosis not present

## 2016-06-07 DIAGNOSIS — Z6841 Body Mass Index (BMI) 40.0 and over, adult: Secondary | ICD-10-CM | POA: Diagnosis not present

## 2016-06-07 DIAGNOSIS — Z8673 Personal history of transient ischemic attack (TIA), and cerebral infarction without residual deficits: Secondary | ICD-10-CM | POA: Diagnosis not present

## 2016-06-07 DIAGNOSIS — Z96612 Presence of left artificial shoulder joint: Secondary | ICD-10-CM | POA: Diagnosis not present

## 2016-06-07 DIAGNOSIS — Z79891 Long term (current) use of opiate analgesic: Secondary | ICD-10-CM | POA: Diagnosis not present

## 2016-06-07 DIAGNOSIS — M5134 Other intervertebral disc degeneration, thoracic region: Secondary | ICD-10-CM | POA: Diagnosis not present

## 2016-06-07 DIAGNOSIS — Z471 Aftercare following joint replacement surgery: Secondary | ICD-10-CM | POA: Diagnosis not present

## 2016-06-08 ENCOUNTER — Telehealth: Payer: Self-pay | Admitting: Internal Medicine

## 2016-06-08 NOTE — Telephone Encounter (Signed)
Pt is having SOB refused [facility] and only wanted to talk with [facility]

## 2016-06-08 NOTE — Telephone Encounter (Signed)
Per WP, call pt in the morning.

## 2016-06-09 ENCOUNTER — Encounter: Payer: Self-pay | Admitting: Internal Medicine

## 2016-06-09 NOTE — Telephone Encounter (Signed)
Please call pt and schedule her to see Dr.Panosh tomorrow.

## 2016-06-09 NOTE — Telephone Encounter (Signed)
Pt has been sch
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
## 2016-06-09 NOTE — Telephone Encounter (Signed)
Left a message for a return call.

## 2016-06-09 NOTE — Telephone Encounter (Signed)
Pt is return misty call

## 2016-06-10 ENCOUNTER — Encounter: Payer: Self-pay | Admitting: Internal Medicine

## 2016-06-10 ENCOUNTER — Ambulatory Visit
Admission: RE | Admit: 2016-06-10 | Discharge: 2016-06-10 | Disposition: A | Discharge status: Home / Self Care | Payer: PPO | Source: Ambulatory Visit | Attending: Internal Medicine | Admitting: Internal Medicine

## 2016-06-10 ENCOUNTER — Ambulatory Visit (INDEPENDENT_AMBULATORY_CARE_PROVIDER_SITE_OTHER): Payer: PPO | Admitting: Internal Medicine

## 2016-06-10 VITALS — BP 100/70 | HR 90 | Temp 98.4°F | Ht 61.0 in | Wt 300.0 lb

## 2016-06-10 DIAGNOSIS — Z79899 Other long term (current) drug therapy: Secondary | ICD-10-CM | POA: Diagnosis not present

## 2016-06-10 DIAGNOSIS — R0602 Shortness of breath: Secondary | ICD-10-CM | POA: Diagnosis not present

## 2016-06-10 DIAGNOSIS — E538 Deficiency of other specified B group vitamins: Secondary | ICD-10-CM | POA: Diagnosis not present

## 2016-06-10 DIAGNOSIS — I5023 Acute on chronic systolic (congestive) heart failure: Secondary | ICD-10-CM

## 2016-06-10 DIAGNOSIS — Z7901 Long term (current) use of anticoagulants: Secondary | ICD-10-CM

## 2016-06-10 DIAGNOSIS — Z23 Encounter for immunization: Secondary | ICD-10-CM

## 2016-06-10 DIAGNOSIS — R0609 Other forms of dyspnea: Secondary | ICD-10-CM

## 2016-06-10 LAB — CBC WITH DIFFERENTIAL/PLATELET
BASOS ABS: 0 10*3/uL (ref 0.0–0.1)
Basophils Relative: 0.4 % (ref 0.0–3.0)
Eosinophils Absolute: 0.4 10*3/uL (ref 0.0–0.7)
Eosinophils Relative: 4.9 % (ref 0.0–5.0)
HCT: 33.9 % — ABNORMAL LOW (ref 36.0–46.0)
Hemoglobin: 10.8 g/dL — ABNORMAL LOW (ref 12.0–15.0)
LYMPHS ABS: 0.8 10*3/uL (ref 0.7–4.0)
Lymphocytes Relative: 9.8 % — ABNORMAL LOW (ref 12.0–46.0)
MCHC: 31.9 g/dL (ref 30.0–36.0)
MCV: 95.2 fl (ref 78.0–100.0)
MONO ABS: 0.7 10*3/uL (ref 0.1–1.0)
MONOS PCT: 8.7 % (ref 3.0–12.0)
NEUTROS ABS: 5.8 10*3/uL (ref 1.4–7.7)
NEUTROS PCT: 76.2 % (ref 43.0–77.0)
PLATELETS: 255 10*3/uL (ref 150.0–400.0)
RBC: 3.56 Mil/uL — AB (ref 3.87–5.11)
RDW: 17 % — ABNORMAL HIGH (ref 11.5–15.5)
WBC: 7.7 10*3/uL (ref 4.0–10.5)

## 2016-06-10 LAB — BASIC METABOLIC PANEL
BUN: 18 mg/dL (ref 6–23)
CALCIUM: 9.1 mg/dL (ref 8.4–10.5)
CO2: 28 meq/L (ref 19–32)
CREATININE: 0.98 mg/dL (ref 0.40–1.20)
Chloride: 106 mEq/L (ref 96–112)
GFR: 60.2 mL/min (ref >60.00)
GLUCOSE: 102 mg/dL — AB (ref 70–99)
Potassium: 4 mEq/L (ref 3.5–5.1)
Sodium: 141 mEq/L (ref 135–145)

## 2016-06-10 LAB — TSH: TSH: 2.31 u[IU]/mL (ref 0.35–4.50)

## 2016-06-10 LAB — BRAIN NATRIURETIC PEPTIDE: PRO B NATRI PEPTIDE: 513 pg/mL — AB (ref 0.0–100.0)

## 2016-06-10 MED ORDER — CYANOCOBALAMIN 1000 MCG/ML IJ SOLN
1000.0000 ug | Freq: Once | INTRAMUSCULAR | Status: AC
  Administered 2016-06-10: 1000 ug via INTRAMUSCULAR

## 2016-06-10 NOTE — Telephone Encounter (Signed)
Patient has made an appt to see Dr. Regis Bill on 06/10/16.

## 2016-06-10 NOTE — Patient Instructions (Addendum)
Lab and x ray today  Over the weekend take  40 mg lasix  2 x per day for next   3 days  .  Take a potassium pill with each dose of lasix  Unless we tell you otherwise .  Plan  Poss Ov with cards next week.   Will notify you  of labs when available.  If worsening  Over weekend seek emergency care .

## 2016-06-13 ENCOUNTER — Telehealth: Payer: Self-pay | Admitting: Cardiovascular Disease

## 2016-06-13 ENCOUNTER — Encounter: Payer: Self-pay | Admitting: Internal Medicine

## 2016-06-13 NOTE — Telephone Encounter (Signed)
Patient calling the office for samples of medication:   1.  What medication and dosage are you requesting samples for? xarelto  2.  Are you currently out of this medication? no

## 2016-06-14 ENCOUNTER — Ambulatory Visit (INDEPENDENT_AMBULATORY_CARE_PROVIDER_SITE_OTHER): Payer: PPO | Admitting: Cardiovascular Disease

## 2016-06-14 ENCOUNTER — Encounter: Payer: Self-pay | Admitting: Cardiovascular Disease

## 2016-06-14 DIAGNOSIS — R600 Localized edema: Secondary | ICD-10-CM

## 2016-06-14 DIAGNOSIS — I482 Chronic atrial fibrillation, unspecified: Secondary | ICD-10-CM

## 2016-06-14 DIAGNOSIS — Z471 Aftercare following joint replacement surgery: Secondary | ICD-10-CM | POA: Diagnosis not present

## 2016-06-14 DIAGNOSIS — I10 Essential (primary) hypertension: Secondary | ICD-10-CM | POA: Diagnosis not present

## 2016-06-14 DIAGNOSIS — Z96612 Presence of left artificial shoulder joint: Secondary | ICD-10-CM | POA: Diagnosis not present

## 2016-06-14 NOTE — Assessment & Plan Note (Signed)
History of bilateral lower extremity edema on furosemide 40 mg a day. She has minimal ankle edema today. She recently had her dose unintentionally decrease to 20 mg during rehabilitation resulting in increased weight, swelling and shortness of breath.

## 2016-06-14 NOTE — Assessment & Plan Note (Signed)
History of hypertension blood pressure measures 90/66. She is on atenolol. Continue current meds at current dosing

## 2016-06-14 NOTE — Progress Notes (Signed)
06/14/2016 [name]   [birth year]  [id number]  Primary Physician Stacy Dawson, MD Primary Cardiologist: Stacy Harp MD [name]  HPI:   The patient is a 65 year old, morbidly overweight, divorced Caucasian female, mother of 56, grandmother to 2 grandchildren who I last saw in the office 10/27/15. She is accompanied by her brother body and sister [name] today. She has a history of hypertension and hyperlipidemia. She has had a stroke in the past and has paroxysmal A-fib now in chronic A-fib on Xarelto anticoagulation. She is rate controlled. She has had an uvulopalatopharyngoplasty which improved her symptoms of sleep apnea. Dr. Shanon [name] follows her lipid profile and apparently recently stopped her Zocor. A 2D echo showed normal LV function with mild left atrial enlargement and a Myoview stress test performed December 08, 2010, was low risk. She developed a "viral illness" back in September and subsequent to that has had progressive dyspnea on exertion and lower extremity edema. She was seen by one of our mid-level providers in October and 2-D echo was normal. Her diuretics were doubled. She was admitted with cellulitis in November. Arterial and venous Dopplers were normal. She continues to have severe dyspnea on exertion and 3+ pitting edema although she missed a increasing diuresis since her Lasix was doubled. She saw [name] several weeks after my last office visit on 09/29/15 and was clearly improving. A subsequent 2-D echo was normal. Her weight has come down 40 pounds and her edema has resolved as has her dyspnea. Her BMP likewise has come down from 500-300. Since I saw her in the office in February this year she has had a left shoulder replacement by Dr. Veverly [name] . Unfortunately, her Lasix was unintentionally cut from 40-20 mg a day during rehabilitation resulting in weight gain, increased edema and shortness of breath. This has since been  corrected..   Current Outpatient Prescriptions  Medication Sig Dispense Refill  . allopurinol (ZYLOPRIM) 100 MG tablet TAKE 2 TABLETS EVERY DAY 180 tablet 1  . atenolol (TENORMIN) 50 MG tablet Take 1 tablet by mouth in the AM and 0.5 tablet by mouth in the PM 135 tablet 3  . colchicine 0.6 MG tablet Take 0.6 mg by mouth daily as needed.    . diclofenac sodium (VOLTAREN) 1 % GEL Apply 2 g topically 2 (two) times daily as needed (for arthritis).     . furosemide (LASIX) 40 MG tablet TAKE 1 TABLET BY MOUTH EVERY DAY FOR 3 DAYS THEN TAKE 1/2 TABLET BY MOUTH EVERY DAY 90 tablet 3  . levothyroxine (SYNTHROID, LEVOTHROID) 75 MCG tablet TAKE 1 TABLET EVERY DAY 90 tablet 1  . methocarbamol (ROBAXIN) 500 MG tablet Take 500 mg by mouth every 6 (six) hours as needed for muscle spasms.    . Omega-3 Fatty Acids (FISH OIL) 1000 MG CAPS Take 1,000 mg by mouth daily.    . potassium chloride (KLOR-CON 10) 10 MEQ tablet Take 1 tablet (10 mEq total) by mouth daily. 30 tablet 2  . rivaroxaban (XARELTO) 20 MG TABS tablet Take 1 tablet (20 mg total) by mouth daily with supper. 20 tablet 0  . traMADol (ULTRAM) 50 MG tablet Take 1 tablet by mouth every 6 hours as needed for moderate pain, take 2 tablets by mouth every 6 hours as needed for severe pain 120 tablet 0   No current facility-administered medications for this visit.     Allergies  Allergen Reactions  . Ambien [Zolpidem Tartrate]  Amnesia and fall   . Prevacid [Lansoprazole] Swelling  . [name] Inhibitors Cough  . Adhesive [Tape] Other (See Comments)    Adhesive tape and band aids " irritate, " causes blisters and pulls skin when removing. Okay to use paper tape  . Benadryl [Diphenhydramine Hcl]     topical  . Keflex [Cephalexin] Swelling    Swelling in ankles, feet  . Losartan     Elevated creatinine and swelling  . Motrin [Ibuprofen] Other (See Comments)    ANKLE EDEMA   . Prilosec [Omeprazole]     Swelling in ankles.    Social History    Social History  . Marital status: Divorced    Spouse name: N/A  . Number of children: N/A  . Years of education: N/A   Occupational History  . Not on file.   Social History Main Topics  . Smoking status: Never Smoker  . Smokeless tobacco: Never Used  . Alcohol use 0.6 oz/week    1 Glasses of wine per week     Comment: occ  . Drug use: No  . Sexual activity: No   Other Topics Concern  . 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  Cardiovascular: negative for chest pain, dyspnea on exertion, edema, orthopnea, palpitations, paroxysmal nocturnal dyspnea or shortness of breath Dermatological: negative for rash Respiratory: negative for cough or wheezing Urologic: negative for hematuria Abdominal: negative for nausea, vomiting, diarrhea, bright red blood per rectum, melena, or hematemesis Neurologic: negative for visual changes, syncope, or dizziness All other systems reviewed and are otherwise negative except as noted above.    Blood pressure 90/66, pulse 70, height 5' 2" (1.575 m), weight 295 lb (133.8 kg), last menstrual period 05/06/2013.  General appearance: alert and no distress Neck: no adenopathy, no carotid bruit, no JVD, supple, symmetrical, trachea midline and thyroid not enlarged, symmetric, no tenderness/mass/nodules Lungs: clear to auscultation bilaterally Heart: irregularly irregular rhythm Extremities: extremities normal, atraumatic, no cyanosis or edema  EKG not performed today  ASSESSMENT AND PLAN:   HYPERLIPIDEMIA History of hyperlipidemia not on statin therapy of recent lipid profile performed 09/01/15 revealing a total cholesterol 65, LDL 98 HDL of 52.  Chronic atrial fibrillation (HCC) History of chronic A. fib with stroke in the past on Xarelto oral  anticoagulation.  Hypertension History of hypertension blood pressure measures 90/66. She is on atenolol. Continue current meds at current dosing  Bilateral lower extremity edema History of bilateral lower extremity edema on furosemide 40 mg a day. She has minimal ankle edema today. She recently had her dose unintentionally decrease to 20 mg during rehabilitation resulting in increased weight, swelling and shortness of breath.      Stacy Harp MD FACP,FACC,FAHA, [facility] 06/14/2016 11:26 AM

## 2016-06-14 NOTE — Assessment & Plan Note (Signed)
History of chronic A. fib with stroke in the past on Xarelto oral anticoagulation.

## 2016-06-14 NOTE — Patient Instructions (Signed)
Medication Instructions:  NO CHANGES.   Follow-Up: Your physician wants you to follow-up in: [place].  You will receive a reminder letter in the mail two months in advance. If you don't receive a letter, please call our office to schedule the follow-up appointment.   If you need a refill on your cardiac medications before your next appointment, please call your pharmacy.

## 2016-06-14 NOTE — Assessment & Plan Note (Signed)
History of hyperlipidemia not on statin therapy of recent lipid profile performed 09/01/15 revealing a total cholesterol 65, LDL 98 HDL of 52.

## 2016-06-15 DIAGNOSIS — I10 Essential (primary) hypertension: Secondary | ICD-10-CM | POA: Diagnosis not present

## 2016-06-15 DIAGNOSIS — E669 Obesity, unspecified: Secondary | ICD-10-CM | POA: Diagnosis not present

## 2016-06-15 DIAGNOSIS — Z471 Aftercare following joint replacement surgery: Secondary | ICD-10-CM | POA: Diagnosis not present

## 2016-06-15 DIAGNOSIS — M47816 Spondylosis without myelopathy or radiculopathy, lumbar region: Secondary | ICD-10-CM | POA: Diagnosis not present

## 2016-06-15 DIAGNOSIS — Z96612 Presence of left artificial shoulder joint: Secondary | ICD-10-CM | POA: Diagnosis not present

## 2016-06-15 DIAGNOSIS — M5134 Other intervertebral disc degeneration, thoracic region: Secondary | ICD-10-CM | POA: Diagnosis not present

## 2016-06-15 DIAGNOSIS — Z8673 Personal history of transient ischemic attack (TIA), and cerebral infarction without residual deficits: Secondary | ICD-10-CM | POA: Diagnosis not present

## 2016-06-15 DIAGNOSIS — Z6841 Body Mass Index (BMI) 40.0 and over, adult: Secondary | ICD-10-CM | POA: Diagnosis not present

## 2016-06-15 DIAGNOSIS — M5136 Other intervertebral disc degeneration, lumbar region: Secondary | ICD-10-CM | POA: Diagnosis not present

## 2016-06-15 DIAGNOSIS — Z79891 Long term (current) use of opiate analgesic: Secondary | ICD-10-CM | POA: Diagnosis not present

## 2016-06-15 DIAGNOSIS — Z7901 Long term (current) use of anticoagulants: Secondary | ICD-10-CM | POA: Diagnosis not present

## 2016-06-17 ENCOUNTER — Telehealth: Payer: Self-pay | Admitting: Cardiovascular Disease

## 2016-06-17 MED ORDER — RIVAROXABAN 20 MG PO TABS: 20.0000 mg | ORAL_TABLET | Freq: Every day | ORAL | 0 refills | Status: DC

## 2016-06-17 NOTE — Telephone Encounter (Signed)
Spoke to patient, advised samples available at front desk for pickup.  Medication Samples have been provided to the patient.  Drug name: Xarelto 14m Qty: 28  LOT: [id number].Date: 02-20  The patient has been instructed regarding the correct time, dose, and frequency of taking this medication, including desired effects and most common side effects.

## 2016-06-17 NOTE — Telephone Encounter (Signed)
New message      Patient calling the office for samples of medication:   1.  What medication and dosage are you requesting samples for? Xarelto 33m  2.  Are you currently out of this medication? Pt is in the donut hole

## 2016-06-17 NOTE — Telephone Encounter (Signed)
Saw dr berry yesterday

## 2016-06-23 ENCOUNTER — Telehealth: Payer: Self-pay | Admitting: *Deleted

## 2016-06-23 NOTE — Telephone Encounter (Signed)
Received in the mail from [employer] Patient Assistance the patient does not meet eligibility requirements for Xarelto assistance at this time. Document placed to be scanned into EPIC.

## 2016-06-24 ENCOUNTER — Other Ambulatory Visit: Payer: Self-pay | Admitting: Internal Medicine

## 2016-06-27 ENCOUNTER — Encounter: Payer: Self-pay | Admitting: Internal Medicine

## 2016-06-28 ENCOUNTER — Telehealth: Payer: Self-pay | Admitting: Cardiovascular Disease

## 2016-06-28 NOTE — Telephone Encounter (Signed)
New message    Office calling patient C/O  low o2 stats & fluid retaining.

## 2016-06-28 NOTE — Telephone Encounter (Signed)
Spoke to patient .offered appointment to see extender tomorrow - declined patiens state she saw DR beery last week lasix was double to 80 mg 3 days then returned to 40 mg.   SHE SENT MESSAGE TO PRIMARY- MEDICATION PER PATIENT IS NOT WORKING AS WELL AS IN THE PAST  She fellede labored for the last 2 days. No weight increase per patient. She states she weighs daily. " I really don't want go to hospital , or doctor office ,rehab. I know have not been eating right or moving like I should." Patient would like to try increasing lasix again. Per protocol, informed patient to double lasix for today and tomorrow, if no change - come to appointment on Friday 07/01/16 at 3 pm  Patient in agreement.

## 2016-06-28 NOTE — Telephone Encounter (Signed)
Trixie Dredge, RN, spoke with pt and adjusted her medications. She also scheduled pt with an appt @ cards 07/01/16. Nothing further needed.

## 2016-06-28 NOTE — Telephone Encounter (Signed)
Spoke with pt and she states that she has recorded O2 as low as 80-83% on RA, that was this morning. Pt reports that during the day the level has increased to 86-89% on RA. Pt also reports slight productive cough in early mornings that improves during the day. Pt thinks is reflux related. Pt reports some ShOB with exertion, possibly due to fluid retention. Pt denies wheeze/chest tightness/pain.  Pt has been taking Lasix 5m over the weekend but has now gone back to 459mtoday. LE's with no edema but some possible abdominal bloating. Pt has been drinking 3-4 8oz bottles of water daily.    Spoke with [place] she suggested to call pt's cards office for recommendations. Called and left msg for Dr BeKennon Holterurse.

## 2016-06-30 NOTE — Telephone Encounter (Signed)
I spoke w/ patient. She is short of breath today and notes O2 sats when checked are 80-90%. She thinks the lasix increase has failed to help her w her symptoms. Notes no change in UOP. I recommended she be seen in the ER - to which patient declined.  I offered to change her PA appt to today Lurena Joiner has openings this afternoon). Patient declined these and states she will not be able to make it to office today. She states she will wait until tomorrow to be seen. Patient aware I have made a reasonable effort to accomodate her to be evaluated in a timely fashion, and that if she is feeling worse she should go to the emergency room for care. Patient voiced understanding.

## 2016-06-30 NOTE — Telephone Encounter (Signed)
Pt c/o Shortness Of Breath: STAT if SOB developed within the last 24 hours or pt is noticeably SOB on the phone  1. Are you currently SOB (can you hear that pt is SOB on the phone)? no 2. How long have you been experiencing SOB?  week 3. Are you SOB when sitting or when up moving around?  both 4. Are you currently experiencing any other symptoms?  no

## 2016-07-01 ENCOUNTER — Other Ambulatory Visit: Payer: Self-pay | Admitting: Physician Assistant

## 2016-07-01 ENCOUNTER — Ambulatory Visit (INDEPENDENT_AMBULATORY_CARE_PROVIDER_SITE_OTHER): Payer: PPO | Admitting: Physician Assistant

## 2016-07-01 ENCOUNTER — Other Ambulatory Visit: Payer: Self-pay | Admitting: Internal Medicine

## 2016-07-01 ENCOUNTER — Encounter: Payer: Self-pay | Admitting: Physician Assistant

## 2016-07-01 VITALS — BP 110/70 | HR 89 | Ht 61.0 in | Wt 296.0 lb

## 2016-07-01 DIAGNOSIS — I4821 Permanent atrial fibrillation: Secondary | ICD-10-CM

## 2016-07-01 DIAGNOSIS — I5033 Acute on chronic diastolic (congestive) heart failure: Secondary | ICD-10-CM

## 2016-07-01 DIAGNOSIS — R0902 Hypoxemia: Secondary | ICD-10-CM

## 2016-07-01 DIAGNOSIS — I482 Chronic atrial fibrillation: Secondary | ICD-10-CM

## 2016-07-01 DIAGNOSIS — Z7901 Long term (current) use of anticoagulants: Secondary | ICD-10-CM | POA: Diagnosis not present

## 2016-07-01 MED ORDER — METOLAZONE 2.5 MG PO TABS: ORAL_TABLET | ORAL | 3 refills | Status: DC

## 2016-07-01 NOTE — Telephone Encounter (Signed)
FILLED ON 02/16/2016 FOR 6 MONTHS.  REFILL REQUEST IS EARLY. DENIED
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
## 2016-07-01 NOTE — Patient Instructions (Addendum)
We will order home oxygen for you. The company or we will contact you and they will bring out the equipment.  Continue 2000 mg sodium diet, heart healthy Continue daily weights  Continue Lasix 40 mg and potassium 10 meq daily Add metolazone (Zaroxylyn) 2.5 mg daily for 3 days. Take 30 minutes before the Lasix. Every time you take the metolazone, take 2 potassium tablets.  After the 3 days of metolazone, take only the Lasix and potassium 10 meq for 3 days, as long as you have lost at least 5 lbs. If you have not lost at least 5 lbs, continue the metolazone every day, always with 2 potassium tablets.   Stop the metolazone if your blood pressure is less than 95 systolic or you are getting light-headed when you stand up.   After the 3 days metolazone-free period, take the metolazone and 2 potassium tablets every other day until you are seen.

## 2016-07-02 ENCOUNTER — Telehealth: Payer: Self-pay | Admitting: Physician Assistant

## 2016-07-02 NOTE — Telephone Encounter (Signed)
Patient was seen by [name] yesterday in the cardiology office. Given the fact that she has chronic respiratory failure with O2 saturations sometimes dipping down to the 80s at home. We did order outpatient home oxygen. Patient called after hour answering service today saying that she needs a home oxygen and this weekend and cannot wait until Monday. I have spoke to Education officer, museum and case manager, I have also called home health service, phone number [phone]. I was instructed to send in order form, office note, and insurance information. Apparently they did not receive any order from the office yesterday. I have prepared all three and faxed to [phone]. I have informed the patient to follow-up with home health service on obtaining her home O2.  Hilbert Corrigan PA Pager: [phone]

## 2016-07-05 ENCOUNTER — Telehealth: Payer: Self-pay | Admitting: Physician Assistant

## 2016-07-05 DIAGNOSIS — R0902 Hypoxemia: Secondary | ICD-10-CM | POA: Diagnosis not present

## 2016-07-05 DIAGNOSIS — I5033 Acute on chronic diastolic (congestive) heart failure: Secondary | ICD-10-CM | POA: Diagnosis not present

## 2016-07-05 NOTE — Telephone Encounter (Signed)
No answer. Left message with pt to call back.

## 2016-07-05 NOTE — Telephone Encounter (Signed)
Received incoming call from patient. Patient stated [name] said she just needed documented in the office note the patient's oxygen level at rest that day.  She has already received her oxygen this past Saturday, but had to give them her credit card number. She does not want it billed to her credit card but to her insurance instead and she needs this documentation prior. Patient aware call has been routed to [place] and she would like a call letting her know when this is resolved.

## 2016-07-05 NOTE — Telephone Encounter (Signed)
[place], they have orders for home oxygen but need additional information of patient's oxygen level at rest and with ambulation in order for final approval of home oxygen.  Will route to [name] and Almyra Deforest for information if they have that information.

## 2016-07-05 NOTE — Telephone Encounter (Signed)
We checked her O2 sat in the office when she was here that day. It was 85% at rest, on room air. Sorry that did not get documented in the chart, but I remember it clearly. If I need to do anything else, let me know. Thanks

## 2016-07-05 NOTE — Telephone Encounter (Signed)
Please call,needs something in order to get her oxygen approved.

## 2016-07-06 LAB — BASIC METABOLIC PANEL WITH GFR
BUN: 17 mg/dL (ref 7–25)
CALCIUM: 9 mg/dL (ref 8.6–10.4)
CO2: 28 mmol/L (ref 20–31)
Chloride: 98 mmol/L (ref 98–110)
Creat: 1.01 mg/dL — ABNORMAL HIGH (ref 0.50–0.99)
GFR, EST AFRICAN AMERICAN: 67 mL/min (ref >=60)
GFR, EST NON AFRICAN AMERICAN: 58 mL/min — AB (ref >=60)
GLUCOSE: 99 mg/dL (ref 65–99)
Potassium: 3.9 mmol/L (ref 3.5–5.3)
SODIUM: 139 mmol/L (ref 135–146)

## 2016-07-06 NOTE — Telephone Encounter (Signed)
No answer. Left message that necessary documentation had been faxed to [place] and to call if she has any questions.

## 2016-07-06 NOTE — Telephone Encounter (Signed)
Encounter faxed to [place] via [employer].

## 2016-07-06 NOTE — Telephone Encounter (Signed)
Thank you.

## 2016-07-06 NOTE — Telephone Encounter (Signed)
[place] to check on status and see if fax with O2 information was received.  Representative said the telephone encounter is not enough to qualify the oxygen but they are faxing a form to [phone] that RN can fill out and sign in order to qualify the O2.  Checked and fax not received at this moment.  Will continue to check.

## 2016-07-06 NOTE — Telephone Encounter (Signed)
Received fax from [facility]. Document was to confirm "Room air resting pulse oximetry: 85%." Signed by Lars Pinks, RN and faxed to [facility].

## 2016-07-07 ENCOUNTER — Other Ambulatory Visit: Payer: Self-pay | Admitting: Internal Medicine

## 2016-07-07 ENCOUNTER — Other Ambulatory Visit: Payer: Self-pay | Admitting: Nurse Practitioner

## 2016-07-07 NOTE — Telephone Encounter (Signed)
Patient made aware.

## 2016-07-11 ENCOUNTER — Encounter: Payer: Self-pay | Admitting: Internal Medicine

## 2016-07-11 ENCOUNTER — Encounter: Payer: Self-pay | Admitting: Physician Assistant

## 2016-07-11 NOTE — Telephone Encounter (Signed)
Ok to refill x 1  

## 2016-07-11 NOTE — Telephone Encounter (Signed)
Called to the pharmacy and left on machine.

## 2016-07-19 ENCOUNTER — Encounter: Payer: Self-pay | Admitting: Internal Medicine
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
## 2016-07-19 NOTE — Telephone Encounter (Signed)
bnp not helpful for  Routine    Bmp  Ok to do  Still probabaly better to talk with you first  Before getting labs

## 2016-07-20 ENCOUNTER — Encounter: Payer: Self-pay | Admitting: Internal Medicine

## 2016-07-20 ENCOUNTER — Ambulatory Visit (INDEPENDENT_AMBULATORY_CARE_PROVIDER_SITE_OTHER): Payer: PPO | Admitting: Internal Medicine

## 2016-07-20 VITALS — BP 100/74 | Temp 98.2°F | Wt 284.5 lb

## 2016-07-20 DIAGNOSIS — D519 Vitamin B12 deficiency anemia, unspecified: Secondary | ICD-10-CM | POA: Diagnosis not present

## 2016-07-20 DIAGNOSIS — Z79899 Other long term (current) drug therapy: Secondary | ICD-10-CM

## 2016-07-20 DIAGNOSIS — R7301 Impaired fasting glucose: Secondary | ICD-10-CM

## 2016-07-20 DIAGNOSIS — Z0000 Encounter for general adult medical examination without abnormal findings: Secondary | ICD-10-CM | POA: Diagnosis not present

## 2016-07-20 DIAGNOSIS — E039 Hypothyroidism, unspecified: Secondary | ICD-10-CM

## 2016-07-20 DIAGNOSIS — E559 Vitamin D deficiency, unspecified: Secondary | ICD-10-CM | POA: Diagnosis not present

## 2016-07-20 DIAGNOSIS — R0902 Hypoxemia: Secondary | ICD-10-CM

## 2016-07-20 DIAGNOSIS — Z7901 Long term (current) use of anticoagulants: Secondary | ICD-10-CM | POA: Diagnosis not present

## 2016-07-20 DIAGNOSIS — E790 Hyperuricemia without signs of inflammatory arthritis and tophaceous disease: Secondary | ICD-10-CM | POA: Diagnosis not present

## 2016-07-20 DIAGNOSIS — I5033 Acute on chronic diastolic (congestive) heart failure: Secondary | ICD-10-CM

## 2016-07-20 LAB — BASIC METABOLIC PANEL
BUN: 24 mg/dL — ABNORMAL HIGH (ref 6–23)
CO2: 36 mEq/L — ABNORMAL HIGH (ref 19–32)
Calcium: 9.1 mg/dL (ref 8.4–10.5)
Chloride: 90 mEq/L — ABNORMAL LOW (ref 96–112)
Creatinine, Ser: 1.2 mg/dL (ref 0.40–1.20)
GFR: 47.64 mL/min — AB (ref >60.00)
Glucose, Bld: 100 mg/dL — ABNORMAL HIGH (ref 70–99)
POTASSIUM: 3.6 meq/L (ref 3.5–5.1)
SODIUM: 137 meq/L (ref 135–145)

## 2016-07-20 LAB — LIPID PANEL
CHOL/HDL RATIO: 5
Cholesterol: 142 mg/dL (ref 0–200)
HDL: 29.6 mg/dL — AB (ref >39.00)
LDL CALC: 86 mg/dL (ref 0–99)
NONHDL: 112.25
TRIGLYCERIDES: 129 mg/dL (ref 0.0–149.0)
VLDL: 25.8 mg/dL (ref 0.0–40.0)

## 2016-07-20 LAB — CBC WITH DIFFERENTIAL/PLATELET
Basophils Absolute: 0 10*3/uL (ref 0.0–0.1)
Basophils Relative: 0.5 % (ref 0.0–3.0)
EOS PCT: 3.7 % (ref 0.0–5.0)
Eosinophils Absolute: 0.3 10*3/uL (ref 0.0–0.7)
HCT: 32.6 % — ABNORMAL LOW (ref 36.0–46.0)
HEMOGLOBIN: 10.2 g/dL — AB (ref 12.0–15.0)
Lymphocytes Relative: 8 % — ABNORMAL LOW (ref 12.0–46.0)
Lymphs Abs: 0.6 10*3/uL — ABNORMAL LOW (ref 0.7–4.0)
MCHC: 31.3 g/dL (ref 30.0–36.0)
MCV: 90.7 fl (ref 78.0–100.0)
MONOS PCT: 5.3 % (ref 3.0–12.0)
Monocytes Absolute: 0.4 10*3/uL (ref 0.1–1.0)
Neutro Abs: 6 10*3/uL (ref 1.4–7.7)
Neutrophils Relative %: 82.5 % — ABNORMAL HIGH (ref 43.0–77.0)
Platelets: 253 10*3/uL (ref 150.0–400.0)
RBC: 3.59 Mil/uL — AB (ref 3.87–5.11)
RDW: 19.4 % — ABNORMAL HIGH (ref 11.5–15.5)
WBC: 7.3 10*3/uL (ref 4.0–10.5)

## 2016-07-20 LAB — HEPATIC FUNCTION PANEL
ALK PHOS: 115 U/L (ref 39–117)
ALT: 7 U/L (ref 0–35)
AST: 21 U/L (ref 0–37)
Albumin: 3.8 g/dL (ref 3.5–5.2)
BILIRUBIN DIRECT: 0.9 mg/dL — AB (ref 0.0–0.3)
BILIRUBIN TOTAL: 2.3 mg/dL — AB (ref 0.2–1.2)
TOTAL PROTEIN: 6 g/dL (ref 6.0–8.3)

## 2016-07-20 LAB — BRAIN NATRIURETIC PEPTIDE: PRO B NATRI PEPTIDE: 566 pg/mL — AB (ref 0.0–100.0)

## 2016-07-20 LAB — VITAMIN D 25 HYDROXY (VIT D DEFICIENCY, FRACTURES): VITD: 52.56 ng/mL (ref 30.00–100.00)

## 2016-07-20 LAB — TSH: TSH: 3.17 u[IU]/mL (ref 0.35–4.50)

## 2016-07-20 LAB — VITAMIN B12: Vitamin B-12: 762 pg/mL (ref 211–911)

## 2016-07-20 NOTE — Patient Instructions (Signed)
Cologuard for colon cancer screening. I agree the low oxygen level and continued difficulty should be followed up. Will contact cardiology and consider pulmonary also. We'll let you know when the lab results are back and make a plan for follow-up.  Many causes of low oxygen some are chronic heart failure some respiratory or combination of the above. Stay on your same medications at this time.  Advance directives  .

## 2016-07-22 ENCOUNTER — Other Ambulatory Visit: Payer: Self-pay | Admitting: Family Medicine

## 2016-07-22 DIAGNOSIS — I272 Pulmonary hypertension, unspecified: Secondary | ICD-10-CM

## 2016-07-22 LAB — URIC ACID: Uric Acid, Serum: 8.6 mg/dL — ABNORMAL HIGH (ref 2.4–7.0)

## 2016-07-24 ENCOUNTER — Other Ambulatory Visit: Payer: Self-pay | Admitting: Internal Medicine

## 2016-07-25 NOTE — Telephone Encounter (Signed)
Sent to the pharmacy by e-scribe.  Pt had cpx on 07/20/16.  TSH normal.

## 2016-08-02 ENCOUNTER — Encounter: Payer: Self-pay | Admitting: Cardiovascular Disease

## 2016-08-02 ENCOUNTER — Ambulatory Visit (INDEPENDENT_AMBULATORY_CARE_PROVIDER_SITE_OTHER): Payer: PPO | Admitting: Cardiovascular Disease

## 2016-08-02 ENCOUNTER — Ambulatory Visit: Payer: PPO | Admitting: Cardiovascular Disease

## 2016-08-02 VITALS — BP 118/63 | HR 89 | Ht 61.0 in

## 2016-08-02 DIAGNOSIS — I482 Chronic atrial fibrillation, unspecified: Secondary | ICD-10-CM

## 2016-08-02 DIAGNOSIS — I10 Essential (primary) hypertension: Secondary | ICD-10-CM

## 2016-08-02 DIAGNOSIS — R269 Unspecified abnormalities of gait and mobility: Secondary | ICD-10-CM | POA: Diagnosis not present

## 2016-08-02 DIAGNOSIS — R0600 Dyspnea, unspecified: Secondary | ICD-10-CM | POA: Diagnosis not present

## 2016-08-02 DIAGNOSIS — I5040 Unspecified combined systolic (congestive) and diastolic (congestive) heart failure: Secondary | ICD-10-CM | POA: Diagnosis not present

## 2016-08-02 DIAGNOSIS — J9610 Chronic respiratory failure, unspecified whether with hypoxia or hypercapnia: Secondary | ICD-10-CM | POA: Diagnosis not present

## 2016-08-02 NOTE — Assessment & Plan Note (Signed)
History of hyperlipidemia not on statin therapy with recent lipid profile performed 07/20/16 revealing a total cholesterol of 42, LDL of 86 and HDL 29.

## 2016-08-02 NOTE — Assessment & Plan Note (Signed)
History of chronic atrial fibrillation rate controlled on Xarelto oral anticoagulation. 

## 2016-08-02 NOTE — Patient Instructions (Signed)
Medication Instructions: Your physician recommends that you continue on your current medications as directed. Please refer to the Current Medication list given to you today.   Testing/Procedures: Your physician has requested that you have a lexiscan myoview. For further information please visit [email or web address]. Please follow instruction sheet, as given.  Your physician has requested that you have an echocardiogram. Echocardiography is a painless test that uses sound waves to create images of your heart. It provides your doctor with information about the size and shape of your heart and how well your heart's chambers and valves are working. This procedure takes approximately one hour. There are no restrictions for this procedure.   Follow-Up: Your physician recommends that you schedule a follow-up appointment in: 8 weeks with Dr. Gwenlyn Found.  If you need a refill on your cardiac medications before your next appointment, please call your pharmacy.

## 2016-08-02 NOTE — Assessment & Plan Note (Signed)
History of hypertension blood pressure measured 118/63. She is on atenolol. Continue current meds at current dosing

## 2016-08-02 NOTE — Assessment & Plan Note (Signed)
History of dyspnea which has increased over the last several months since coming out of a rehabilitation facility after shoulder surgery. She does have diastolic dysfunction, preserved LV systolic function and pulmonary hypertension with a pulmonary artery pressure in the 60 range. She is on chronic diuretic therapy and appears to be at her dry weight. She currently is on 2 L of nasal O2. I'm going to repeat a 2-D echo and perform a Myoview stress test. She is going to see a pulmonologist to further evaluate causes of increasing dyspnea.

## 2016-08-02 NOTE — Progress Notes (Signed)
08/02/2016 [name]   December 11, 1950  [id number]  Primary Physician Stacy Dawson, MD Primary Cardiologist: Stacy Harp MD [name]  HPI:  The patient is a 65 year old, morbidly overweight, divorced Caucasian female, mother of 8, grandmother to 2 grandchildren who I last saw in the office 06/14/16. She is accompanied by her brother body and sister [name] today. She has a history of hypertension and hyperlipidemia. She has had a stroke in the past and has paroxysmal A-fib now in chronic A-fib on Xarelto anticoagulation. She is rate controlled. She has had an uvulopalatopharyngoplasty which improved her symptoms of sleep apnea. Dr. Shanon [name] follows her lipid profile and apparently recently stopped her Zocor. A 2D echo showed normal LV function with mild left atrial enlargement and a Myoview stress test performed December 08, 2010, was low risk. She developed a "viral illness" back in September and subsequent to that has had progressive dyspnea on exertion and lower extremity edema. She was seen by one of our mid-level providers in October and 2-D echo was normal. Her diuretics were doubled. She was admitted with cellulitis in November. Arterial and venous Dopplers were normal. She continues to have severe dyspnea on exertion and 3+ pitting edema although she missed a increasing diuresis since her Lasix was doubled. She saw [name] several weeks after my last office visit on 09/29/15 and was clearly improving. A subsequent 2-D echo was normal. Her weight has come down 40 pounds and her edema has resolved as has her dyspnea. Her BMP likewise has come down from 500-300. Since I saw her in the office in February this year she has had a left shoulder replacement by Dr. Veverly [name] . Since being discharged from the rehabilitation facility after shoulder surgery she's noticed increasing dyspnea on exertion. She appears to be adequately diuresed. She currently requires 2 L of nasal  O2. I'm concerned that she has another cause of dyspnea yet to be diagnosed. She does have pulmonary hypertension by her last 2-D echo in January of this year with PA pressures of 60 mmHg.  Current Outpatient Prescriptions  Medication Sig Dispense Refill  . allopurinol (ZYLOPRIM) 100 MG tablet TAKE 2 TABLETS EVERY DAY 180 tablet 1  . atenolol (TENORMIN) 50 MG tablet Take 1 tablet by mouth in the AM and 0.5 tablet by mouth in the PM 135 tablet 3  . colchicine 0.6 MG tablet Take 0.6 mg by mouth daily as needed.    . diclofenac sodium (VOLTAREN) 1 % GEL Apply 2 g topically 2 (two) times daily as needed (for arthritis).     . furosemide (LASIX) 40 MG tablet TAKE 1 TABLET BY MOUTH EVERY DAY FOR 3 DAYS THEN TAKE 1/2 TABLET BY MOUTH EVERY DAY (Patient taking differently: Take 1 tablet Daily) 90 tablet 3  . levothyroxine (SYNTHROID, LEVOTHROID) 75 MCG tablet TAKE 1 TABLET BY MOUTH EVERY DAY 90 tablet 2  . methocarbamol (ROBAXIN) 500 MG tablet Take 500 mg by mouth every 6 (six) hours as needed for muscle spasms.    . metolazone (ZAROXOLYN) 2.5 MG tablet Take as directed. 90 tablet 3  . Omega-3 Fatty Acids (FISH OIL) 1000 MG CAPS Take 1,000 mg by mouth daily.    . potassium chloride (KLOR-CON 10) 10 MEQ tablet Take 1 tablet (10 mEq total) by mouth daily. (Patient taking differently: Take 20 mEq by mouth daily. ) 30 tablet 2  . rivaroxaban (XARELTO) 20 MG TABS tablet Take 1 tablet (20 mg total)  by mouth daily with supper. 28 tablet 0  . traMADol (ULTRAM) 50 MG tablet TAKE 1 TABLET BY MOUTH 3 TIMES DAILY AS NEEDED FOR PAIN 90 tablet 0   No current facility-administered medications for this visit.     Allergies  Allergen Reactions  . Ambien [Zolpidem Tartrate]     Amnesia and fall   . Prevacid [Lansoprazole] Swelling  . [name] Inhibitors Cough  . Adhesive [Tape] Other (See Comments)    Adhesive tape and band aids " irritate, " causes blisters and pulls skin when removing. Okay to use paper tape  .  Benadryl [Diphenhydramine Hcl]     topical  . Keflex [Cephalexin] Swelling    Swelling in ankles, feet  . Losartan     Elevated creatinine and swelling  . Motrin [Ibuprofen] Other (See Comments)    ANKLE EDEMA   . Prilosec [Omeprazole]     Swelling in ankles.    Social History   Social History  . Marital status: Divorced    Spouse name: N/A  . Number of children: N/A  . Years of education: N/A   Occupational History  . Not on file.   Social History Main Topics  . Smoking status: Never Smoker  . Smokeless tobacco: Never Used  . Alcohol use 0.6 oz/week    1 Glasses of wine per week     Comment: occ  . Drug use: No  . Sexual activity: No   Other Topics Concern  . 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  Cardiovascular: negative for chest pain, dyspnea on exertion, edema, orthopnea, palpitations, paroxysmal nocturnal dyspnea or shortness of breath Dermatological: negative for rash Respiratory: negative for cough or wheezing Urologic: negative for hematuria Abdominal: negative for nausea, vomiting, diarrhea, bright red blood per rectum, melena, or hematemesis Neurologic: negative for visual changes, syncope, or dizziness All other systems reviewed and are otherwise negative except as noted above.    Blood pressure 118/63, pulse 89, height _0  (1.549 m), last menstrual period 05/06/2013.  General appearance: alert and no distress Neck: no adenopathy, no carotid bruit, no JVD, supple, symmetrical, trachea midline and thyroid not enlarged, symmetric, no tenderness/mass/nodules Lungs: clear to auscultation bilaterally Heart: irregularly irregular rhythm Extremities: extremities normal, atraumatic, no cyanosis or edema  EKG not performed today  ASSESSMENT AND PLAN:    HYPERLIPIDEMIA History of hyperlipidemia not on statin therapy with recent lipid profile performed 07/20/16 revealing a total cholesterol of 42, LDL of 86 and HDL 29.  Chronic atrial fibrillation (HCC) History of chronic atrial fibrillation rate controlled on Xarelto  oral anticoagulation.  OSA (obstructive sleep apnea) History of obstructive sleep apnea status post uvula palatopharyngoplasty 5 years ago not on CPAP  Hypertension History of hypertension blood pressure measured 118/63. She is on atenolol. Continue current meds at current dosing  Dyspnea History of dyspnea which has increased over the last several months since coming out of a rehabilitation facility after shoulder surgery. She does have diastolic dysfunction, preserved LV systolic function and pulmonary hypertension with a pulmonary artery pressure in the 60 range. She is on chronic diuretic therapy and appears to be at her dry weight. She currently is on 2 L of nasal O2. I'm  going to repeat a 2-D echo and perform a Myoview stress test. She is going to see a pulmonologist to further evaluate causes of increasing dyspnea.      Stacy Harp MD FACP,FACC,FAHA, [facility] 08/02/2016 12:43 PM

## 2016-08-02 NOTE — Assessment & Plan Note (Signed)
History of obstructive sleep apnea status post uvula palatopharyngoplasty 5 years ago not on CPAP

## 2016-08-03 ENCOUNTER — Institutional Professional Consult (permissible substitution): Payer: PPO | Admitting: Internal Medicine

## 2016-08-03 ENCOUNTER — Telehealth: Payer: Self-pay | Admitting: Physician Assistant

## 2016-08-03 NOTE — Telephone Encounter (Signed)
Per [name]'s last note: She will need home oxygen, at least short term. We will get this ordered for her.   Spoke with pt states that [facility] called her and said that they sent [place] additional paperwork to fill out for pt's home oxygen.

## 2016-08-03 NOTE — Telephone Encounter (Signed)
New message      Pt is calling to check the status on a form from [facility] for orders for oxygen.  Did we receive it?  Patient states the NP ordered the oxygen.  Please call

## 2016-08-03 NOTE — Telephone Encounter (Signed)
I see that we did fill out some paperwork for pt, in scanned documents. It says that it was faxed back on 07-06-16 also.  LMTCB for [facility]

## 2016-08-04 ENCOUNTER — Ambulatory Visit: Payer: PPO | Admitting: Physician Assistant

## 2016-08-04 NOTE — Telephone Encounter (Signed)
Spoke with Mendel Ryder at [facility] she states that all paperwork has been received and per her notes pt wants smaller O2 unit to be more portable. Ok for smaller O2 unit?

## 2016-08-04 NOTE — Telephone Encounter (Signed)
Only fax cover sheet was scanned no form. I called Mendel Ryder again [phone]) and left message to fax again for completion

## 2016-08-05 ENCOUNTER — Other Ambulatory Visit: Payer: Self-pay | Admitting: Internal Medicine

## 2016-08-05 NOTE — Telephone Encounter (Signed)
Yes

## 2016-08-05 NOTE — Telephone Encounter (Signed)
Sent to the pharmacy by e-scribe.

## 2016-08-08 NOTE — Telephone Encounter (Signed)
Faxed to [employer]

## 2016-08-10 ENCOUNTER — Telehealth: Payer: Self-pay | Admitting: Physician Assistant

## 2016-08-10 NOTE — Telephone Encounter (Signed)
Will need to wait until Suanne Marker is back in the office to also get her signature and directions for use.

## 2016-08-10 NOTE — Telephone Encounter (Signed)
Routed to [facility] for f/u.

## 2016-08-10 NOTE — Telephone Encounter (Signed)
Pt called and wanted Calton Golds or Dr Gwenlyn Found to know that [facility] will be sending a prescription for her oxygen.She was told to notify them to be on the lookout for it.

## 2016-08-11 ENCOUNTER — Telehealth: Payer: Self-pay | Admitting: Cardiovascular Disease

## 2016-08-11 ENCOUNTER — Telehealth (HOSPITAL_COMMUNITY): Payer: Self-pay

## 2016-08-11 NOTE — Telephone Encounter (Signed)
Follow up    Pt verbalized that she is returning call for n to see if the medications for [facility] to be signed by any of Dr.Berry's PAs

## 2016-08-11 NOTE — Telephone Encounter (Signed)
Encounter complete. 

## 2016-08-11 NOTE — Telephone Encounter (Signed)
Spoke with pt she states that she is going to buy a small O2 condenser and need any doctor to sign for this please advise

## 2016-08-12 NOTE — Telephone Encounter (Signed)
Duplicate message see other message

## 2016-08-16 ENCOUNTER — Telehealth: Payer: Self-pay | Admitting: Cardiovascular Disease

## 2016-08-16 ENCOUNTER — Ambulatory Visit (HOSPITAL_COMMUNITY)
Admission: RE | Admit: 2016-08-16 | Discharge: 2016-08-16 | Disposition: A | Discharge status: Home / Self Care | Payer: PPO | Source: Ambulatory Visit | Attending: Internal Medicine | Admitting: Internal Medicine

## 2016-08-16 DIAGNOSIS — I482 Chronic atrial fibrillation, unspecified: Secondary | ICD-10-CM

## 2016-08-16 MED ORDER — REGADENOSON 0.4 MG/5ML IV SOLN
0.4000 mg | Freq: Once | INTRAVENOUS | Status: AC
  Administered 2016-08-16: 0.4 mg via INTRAVENOUS

## 2016-08-16 MED ORDER — TECHNETIUM TC 99M TETROFOSMIN IV KIT
28.2000 | PACK | Freq: Once | INTRAVENOUS | Status: AC | PRN
  Administered 2016-08-16: 28.2 via INTRAVENOUS
  Filled 2016-08-16: qty 29

## 2016-08-16 NOTE — Telephone Encounter (Signed)
Spoke to [employer]. They will need new Rx for oxygen and concentrator as well as any new/recent O2 stats or testing. Routing to [employer], PA-C to advise.

## 2016-08-16 NOTE — Telephone Encounter (Signed)
Pt was in the office today for testing. Asked to speak to me when she got back to the lobby. Pt stated she would like to switch to [facility] from [place] and will need a new Rx for oxygen sent over. She also stated that [facility] will send her a portable oxygen concentrator. She stated she would rather do this than spend $3,000 for one out of pocket.   I told pt I will send message to [name], PA-C for new Rx and will call [facility].  Pt verbalized thanks.

## 2016-08-17 ENCOUNTER — Ambulatory Visit (HOSPITAL_COMMUNITY)
Admission: RE | Admit: 2016-08-17 | Discharge: 2016-08-17 | Disposition: A | Discharge status: Home / Self Care | Payer: PPO | Source: Ambulatory Visit | Attending: Cardiovascular Disease | Admitting: Cardiovascular Disease

## 2016-08-17 LAB — MYOCARDIAL PERFUSION IMAGING
CHL CUP NUCLEAR SDS: 10
CHL CUP RESTING HR STRESS: 75 {beats}/min
LV dias vol: 54 mL (ref 46–106)
LV sys vol: 22 mL
Peak HR: 90 {beats}/min
SRS: 2
SSS: 12
TID: 1.29

## 2016-08-17 MED ORDER — TECHNETIUM TC 99M TETROFOSMIN IV KIT
29.8000 | PACK | Freq: Once | INTRAVENOUS | Status: AC | PRN
  Administered 2016-08-17: 29.8 via INTRAVENOUS

## 2016-08-17 NOTE — Telephone Encounter (Signed)
Fine

## 2016-08-17 NOTE — Telephone Encounter (Signed)
Duplicate, see below  Glenford Bayley, calling back for Rx for oxygen and concentrator  Been  Waiting a week for this  Please call and advise

## 2016-08-18 ENCOUNTER — Telehealth: Payer: Self-pay | Admitting: Cardiovascular Disease

## 2016-08-18 NOTE — Telephone Encounter (Signed)
[name] was checking on getting the oxygen from [place].Pt now have it with [facility],she wants to stop using [facility].

## 2016-08-18 NOTE — Telephone Encounter (Signed)
New message    pharmacist calling to speak to rn about medication order fpr

## 2016-08-18 NOTE — Telephone Encounter (Signed)
SPOKE TO [facility] - REP FAXED  SIGNED  ORDER FOR [facility]

## 2016-08-18 NOTE — Telephone Encounter (Signed)
Spoke with patient and she would like orders for oxygen to be sent to [facility] not [facility], stated she spoke with Lovena Le regarding this Will follow up with [facility] tomorrow

## 2016-08-18 NOTE — Telephone Encounter (Signed)
New message    pharmacist calling to speak to rn about medication order for oxygen

## 2016-08-19 ENCOUNTER — Other Ambulatory Visit: Payer: Self-pay | Admitting: Cardiovascular Disease

## 2016-08-19 DIAGNOSIS — R0600 Dyspnea, unspecified: Secondary | ICD-10-CM

## 2016-08-19 DIAGNOSIS — R0689 Other abnormalities of breathing: Secondary | ICD-10-CM

## 2016-08-19 DIAGNOSIS — R0602 Shortness of breath: Secondary | ICD-10-CM

## 2016-08-19 NOTE — Telephone Encounter (Signed)
Will ask Suanne Marker to sign new prescriptions today. New orders placed and will send all information to [place] later today.

## 2016-08-19 NOTE — Telephone Encounter (Signed)
Discussed with Lovena Le and she will follow up

## 2016-08-22 ENCOUNTER — Telehealth: Payer: Self-pay | Admitting: Cardiovascular Disease

## 2016-08-22 NOTE — Telephone Encounter (Signed)
See telephone documentation from 08/22/16. Called pt to tell her info was sent to [place] today via fax.

## 2016-08-22 NOTE — Telephone Encounter (Signed)
Sent fax to [place] containing new Rx for oxygen and oxygen concentrator with Sig: 2 LPM 24/7 w/ nasal cannula. Also contained recent stress test results, CMS 484 medical necessity form from [facility],  and last ov with [name], PA-C and Dr. Gwenlyn Found. --all info per [place] request. Noted to call or fax if any other information needed.

## 2016-08-24 ENCOUNTER — Other Ambulatory Visit: Payer: Self-pay

## 2016-08-24 ENCOUNTER — Ambulatory Visit (HOSPITAL_COMMUNITY): Payer: PPO | Attending: Internal Medicine

## 2016-08-24 DIAGNOSIS — I482 Chronic atrial fibrillation, unspecified: Secondary | ICD-10-CM

## 2016-08-24 DIAGNOSIS — I272 Pulmonary hypertension, unspecified: Secondary | ICD-10-CM | POA: Insufficient documentation

## 2016-08-24 DIAGNOSIS — I517 Cardiomegaly: Secondary | ICD-10-CM

## 2016-08-24 DIAGNOSIS — I340 Nonrheumatic mitral (valve) insufficiency: Secondary | ICD-10-CM | POA: Diagnosis not present

## 2016-08-24 DIAGNOSIS — I071 Rheumatic tricuspid insufficiency: Secondary | ICD-10-CM | POA: Diagnosis not present

## 2016-08-24 LAB — ECHOCARDIOGRAM COMPLETE
CHL CUP DOP CALC LVOT VTI: 15.8 cm
FS: 30 % (ref 28–44)
IV/PV OW: 0.84
LA diam end sys: 54 mm
LA diam index: 2.47 cm/m2
LA vol A4C: 80 ml
LA vol index: 39.7 mL/m2
LA vol: 87 mL
LASIZE: 54 mm
LVOT SV: 55 mL
LVOT area: 3.46 cm2
LVOT peak vel: 84.2 cm/s
LVOTD: 21 mm
PW: 14.5 mm — AB (ref 0.6–1.1)
Reg peak vel: 399 cm/s
TR max vel: 399 cm/s

## 2016-08-29 ENCOUNTER — Other Ambulatory Visit: Payer: Self-pay | Admitting: Internal Medicine

## 2016-08-30 ENCOUNTER — Encounter: Payer: Self-pay | Admitting: Internal Medicine

## 2016-08-30 NOTE — Telephone Encounter (Signed)
Called and left on machine.

## 2016-08-30 NOTE — Telephone Encounter (Signed)
Ok to refill x 1  

## 2016-08-31 ENCOUNTER — Telehealth: Payer: Self-pay | Admitting: Cardiovascular Disease

## 2016-08-31 NOTE — Telephone Encounter (Signed)
[place]/[facility] need clarification on oxygen orders for this pt. Please call [phone]

## 2016-08-31 NOTE — Telephone Encounter (Signed)
Agree she needs a permanent excuse from jury duty   Please compose a letter to request excuse from jury duty permanently because of her her history of hypoxia heart failure mobility limitations and history of stroke. She would be unable to perform jury duty for medical reasons.

## 2016-08-31 NOTE — Telephone Encounter (Signed)
Returned call to [place] with [facility]-unsure if patient is wanting to get her oxygen supplies through [place] or through [place] and she is unable to reach patient.  Called patient to clarify-patient reports she has portable oxygen concentrator through [employer] but wants to go through [facility] for other equipment.  Returned call to [place] to make aware.  Verbalized understanding.

## 2016-09-01 ENCOUNTER — Encounter: Payer: Self-pay | Admitting: Family Medicine

## 2016-09-01 DIAGNOSIS — R269 Unspecified abnormalities of gait and mobility: Secondary | ICD-10-CM | POA: Diagnosis not present

## 2016-09-01 DIAGNOSIS — J9610 Chronic respiratory failure, unspecified whether with hypoxia or hypercapnia: Secondary | ICD-10-CM | POA: Diagnosis not present

## 2016-09-01 DIAGNOSIS — I5040 Unspecified combined systolic (congestive) and diastolic (congestive) heart failure: Secondary | ICD-10-CM | POA: Diagnosis not present

## 2016-09-16 ENCOUNTER — Encounter: Payer: Self-pay | Admitting: Internal Medicine

## 2016-09-21 ENCOUNTER — Institutional Professional Consult (permissible substitution): Payer: PPO | Admitting: Pulmonary Disease

## 2016-09-23 ENCOUNTER — Telehealth: Payer: Self-pay | Admitting: Pulmonary Disease

## 2016-09-23 ENCOUNTER — Telehealth: Payer: Self-pay | Admitting: Cardiovascular Disease

## 2016-09-23 DIAGNOSIS — R0602 Shortness of breath: Secondary | ICD-10-CM

## 2016-09-23 DIAGNOSIS — R0902 Hypoxemia: Secondary | ICD-10-CM

## 2016-09-23 NOTE — Telephone Encounter (Signed)
Returned call to patient- patient states she is having test completed by pulmonologist on 1/22 and 2/1 and then sees Dr. Vaughan Browner on 2/2.  Requesting to move appt with Dr. Gwenlyn Found to after these test are complete.    Appointment rescheduled.  Appointment now on 2/7 at 9:15 AM at [facility] with Dr. Gwenlyn Found.  Pt aware and verbalized understanding.

## 2016-09-23 NOTE — Telephone Encounter (Signed)
Spoke with pt, who states she was started on 2L O2 2-20moago by her cardiologist. Pt states has increased her O2 to 3L, and is having a hard time maintaining her O2 levels above 87 on 3L. Pt has been scheduled to seen PM on 10/07/16 for a consult, pt is requesting an sooner apt.  Pt refused to see MW. Pt also c/o prod cough with yellow mucus and sob. Pt seemed very upset and stated that she is scared.  Pt states her cardiologist had suggested she see BQ. BQ has no availably until 10/26/16.  MR please advise. Thanks.

## 2016-09-23 NOTE — Telephone Encounter (Signed)
I am not sure this can be fixed on a Friday afternoon for a patient we have not seen.  But I did review her data - please tell her tha   - CXR - "congestion" - ECHO dec 2017 - elevated PASP 70s  - No CT chest on file  - No PFT on file  Plan  - go to er for worsening if she is concerned   - do HRCT supine and prone (High Resolution CT chest without contrast on ILD protocol. DO both Supine and Prone Images. Only  Dr Lorin Picket or Dr Salvatore Marvel Dr. Vinnie Langton to read. ) - next week - indication - dyspnea, hypoxemia   - do PFT Pre-bd spiro and dlco only. No lung volume or bd response. No post-bd spiro - any location next week  - this should give Dr Vaughan Browner data for 10/07/16 visit  - however, if results (you can order in my name) show something alarming we can get her worke in sooner with anyone  Dr. Brand Males, M.D., [facility].C.P Pulmonary and Critical Care Medicine Staff Physician [facility] Pager: [phone], If no answer or between  15:00h - 7:00h: call [phone]  09/23/2016 1:15 PM

## 2016-09-23 NOTE — Telephone Encounter (Signed)
Spoke with pt. She is aware of MR's recommendations. PFT has been scheduled for 09/26/16 at 4pm here at our office. Order for CT has been placed. Nothing further was needed at this time.

## 2016-09-23 NOTE — Telephone Encounter (Signed)
New Message  Pt voiced wanting to know if she still needs her appt or does she need to wait.  Please f/u with pt

## 2016-09-26 ENCOUNTER — Ambulatory Visit (INDEPENDENT_AMBULATORY_CARE_PROVIDER_SITE_OTHER): Payer: PPO | Admitting: Internal Medicine

## 2016-09-26 ENCOUNTER — Ambulatory Visit: Payer: Self-pay | Admitting: Internal Medicine

## 2016-09-26 ENCOUNTER — Telehealth: Payer: Self-pay | Admitting: Internal Medicine

## 2016-09-26 ENCOUNTER — Telehealth: Payer: Self-pay | Admitting: Physician Assistant

## 2016-09-26 DIAGNOSIS — R0602 Shortness of breath: Secondary | ICD-10-CM | POA: Diagnosis not present

## 2016-09-26 LAB — PULMONARY FUNCTION TEST
DL/VA % pred: 65 %
DL/VA: 2.89 ml/min/mmHg/L
DLCO COR % PRED: 35 %
DLCO UNC % PRED: 36 %
DLCO UNC: 7.28 ml/min/mmHg
DLCO cor: 7.18 ml/min/mmHg
FEF 25-75 Pre: 0.7 L/sec
FEF2575-%Pred-Pre: 37 %
FEV1-%PRED-PRE: 34 %
FEV1-PRE: 0.72 L
FEV1FVC-%PRED-PRE: 106 %
FEV6-%PRED-PRE: 33 %
FEV6-Pre: 0.88 L
FEV6FVC-%Pred-Pre: 104 %
FVC-%Pred-Pre: 32 %
FVC-PRE: 0.88 L
PRE FEV1/FVC RATIO: 82 %
Pre FEV6/FVC Ratio: 100 %

## 2016-09-26 NOTE — Telephone Encounter (Signed)
[name] is requesting that someone give her a call ASAP. Thanks.

## 2016-09-26 NOTE — Telephone Encounter (Signed)
noted 

## 2016-09-26 NOTE — Telephone Encounter (Signed)
Patient Name: [name]  DOB: [birth year]    Initial Comment Pat states she is on oxygen. She has been trying to get in with the lung dr but appts are not avail. She has been coughing and is producing green mucus. She is having difficulty breathing.   Nurse Assessment  Nurse: Leilani Merl, RN, Heather Date/Time (Eastern Time): 09/26/2016 12:04:34 PM  Confirm and document reason for call. If symptomatic, describe symptoms. ---Fraser Din states she is on oxygen. She has been trying to get in with the lung dr but appts are not avail. She has been coughing and is producing green mucus for the last few weeks. She is having difficulty breathing.  Does the patient have any new or worsening symptoms? ---Yes  Will a triage be completed? ---Yes  Related visit to physician within the last 2 weeks? ---No  Does the PT have any chronic conditions? (i.e. diabetes, asthma, etc.) ---Yes  List chronic conditions. ---See MR  Is this a behavioral health or substance abuse call? ---No     Guidelines    Guideline Title Affirmed Question Affirmed Notes  Cough - Acute Productive Wheezing is present    Final Disposition User   See Physician within 4 Hours (or PCP triage) Leilani Merl, RN, Heather    Comments  Appt scheduled with Dr. Raliegh Ip at 2:30 pm.   Referrals  REFERRED TO PCP OFFICE   Disagree/Comply: Comply

## 2016-09-26 NOTE — Telephone Encounter (Signed)
Dr. Raliegh Ip - Pt has PFT scheduled today at 4pm and consult with Dr. Murlean Iba tomorrow. FYI. Thanks!

## 2016-09-26 NOTE — Telephone Encounter (Signed)
Returned call to patient she stated she already has appointment for PFT's this afternoon and appointment with pulmonary Dr.tomorrow.She stated she has been sob,coughing thick yellow phlegm.Advised to keep appointment with Dr.Berry 10/12/16 at 9:15 am.

## 2016-09-27 ENCOUNTER — Telehealth: Payer: Self-pay | Admitting: Pulmonary Disease

## 2016-09-27 ENCOUNTER — Encounter: Payer: Self-pay | Admitting: Pulmonary Disease

## 2016-09-27 ENCOUNTER — Ambulatory Visit (INDEPENDENT_AMBULATORY_CARE_PROVIDER_SITE_OTHER): Payer: PPO | Admitting: Pulmonary Disease

## 2016-09-27 VITALS — BP 114/82 | HR 88 | Ht 61.0 in | Wt 290.0 lb

## 2016-09-27 DIAGNOSIS — R0902 Hypoxemia: Secondary | ICD-10-CM

## 2016-09-27 DIAGNOSIS — I272 Pulmonary hypertension, unspecified: Secondary | ICD-10-CM

## 2016-09-27 DIAGNOSIS — G4733 Obstructive sleep apnea (adult) (pediatric): Secondary | ICD-10-CM

## 2016-09-27 DIAGNOSIS — G4710 Hypersomnia, unspecified: Secondary | ICD-10-CM

## 2016-09-27 HISTORY — DX: Pulmonary hypertension, unspecified: I27.20

## 2016-09-27 NOTE — Assessment & Plan Note (Signed)
OSA in 2011. Had UPPP in 2011. Sx better but slowly getting more symptomatic since 2017. Has upper somnolent, snoring, gasping, fatigue, dyspnea. Requiring more oxygen. Plan for a split night study on 2L o2.

## 2016-09-27 NOTE — Assessment & Plan Note (Signed)
Patient is a nonsmoker, not known to have COPD or asthma. She had sleep apnea but had UPPP done. She has chronic atrial fibrillation. No history of blood clots. She had surgery in August 2017. During rehabilitation after surgery, her oxygen was noted to be in the mid 80s requiring oxygen.  She has worsening dyspnea since October 2017. She is currently using oxygen, 3 L 24/7 when she is at home. When she leaves house, she uses a pulsed oxygen. They set it at  3 L pulsed oxygen.  When she got to the office, her oxygen level was in the mid 70s on 3 L pulsed oxygen. Her pulsed oxygen can only go up to 3 L. We had to put her on 2-3 liters oxygen, continuous, to keep her O2 sats saturation at least 88% at rest. With walking, she needs at least 8 L oxygen continuous to keep her O2 sats saturation at least 85-88 percent.  Etiologies for hypoxemia: 1. CHFpEF exacerbation, volume overload. By exam, she has edema up to her thighs which is chronic. Her weight has been stable since October 2017. She takes Lasix every day. 2. Likely still has obstructive sleep apnea. 3. Likely with obesity hypoventilation syndrome. 4. Rule out COPD. 5. Rule out chronic thromboembolism. Less likely as she has been on [facility] since having the CVA in 2012.  6. Pulm HTN which is related to everything above.  7. She is not behaving like she has an active infxn (PNA).  No fevers, chills.  Everything appears chronic.   Plan : 1. She will need a pendant Oxymizer to maximize on her oxygen requirement. We will call in to her DME company a [employer]. Once she has dependent Oxymizer, she will need a walk test to determine her oxygen requirement. Awaiting for dependent Oxymizer, keep her on 2-3 liters continuously and 8 L with exertion as far as oxygen is concerned. They have a pulse oximeter. Keep O2 saturation more than 88%.  2. She had a PFT yesterday. Await results.  3. ABG on 2 L.  4. She will need a split-night sleep study on 2  L. Try to expedite study. Likely will need a BiPAP or a  NIV.  5. She has baseline CK D so I can not get a Chest CTA. She is scheduled to get a chest CT scan without contrast next week. Plan to get a VQ scan to rule out chronic blood clots.  6. Ultimately, she will need a left and right heart catheterization to determine if she has PAH. 7. Recommend increasing her Lasix dose.  I will get blood work today. I mentioned to the sister to touch base with her cardiologist and see whether we can go up on her Lasix dose.

## 2016-09-27 NOTE — Patient Instructions (Signed)
It was a pleasure taking care of you today!   Continue with your  oxygen 2 L, 24/7. Keep her O2 sats saturation more than 88%. We will schedule you for a blood test, split night sleep study, and VQ scan.  We will do blood work today as well.  Please call the office if you are having issues with your medications.   Return to clinic in 5-7 weeks.

## 2016-09-27 NOTE — Assessment & Plan Note (Addendum)
Patient with recent dyspnea, increasing oxygen requirement. 2Decho in Dec 2017 :  LVEF 55-60%, moderate LVH, normal wall motion, MAC with mild   posteriorly directed MR, moderate LAE, severe RAE, moderate RVE   with normal RV systolic function, moderate TR, RVSP 79 mmHg   (severe pulmonary hypertension), dilated IVC, no pericardial   effusion. Compared to a prior study in 09/2015, the RVSP has   increased from 61 to 79 mmHg.  Cardiology is following.  Ultimately, I think she'll need a left and right heart catheterization. We need to rule out sleep apnea, obesity hypoventilation syndrome, COPD, chronic pulmonary embolism.  Suggest increasing diuresis. Continue oxygen as discussed. Continue oral anticoagulation.

## 2016-09-27 NOTE — Assessment & Plan Note (Signed)
Weight reduction 

## 2016-09-27 NOTE — Telephone Encounter (Signed)
Error.Stanley A Dalton ° °

## 2016-09-27 NOTE — Addendum Note (Signed)
Addended by: Benson Setting L on: 09/27/2016 01:44 PM   Modules accepted: Orders
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
## 2016-09-28 ENCOUNTER — Telehealth: Payer: Self-pay | Admitting: Pulmonary Disease

## 2016-09-28 ENCOUNTER — Telehealth: Payer: Self-pay | Admitting: Cardiovascular Disease

## 2016-09-28 ENCOUNTER — Other Ambulatory Visit (HOSPITAL_COMMUNITY): Payer: Self-pay | Admitting: Respiratory Therapy

## 2016-09-28 ENCOUNTER — Other Ambulatory Visit (INDEPENDENT_AMBULATORY_CARE_PROVIDER_SITE_OTHER): Payer: PPO

## 2016-09-28 ENCOUNTER — Other Ambulatory Visit: Payer: Self-pay | Admitting: Cardiovascular Disease

## 2016-09-28 DIAGNOSIS — I272 Pulmonary hypertension, unspecified: Secondary | ICD-10-CM | POA: Diagnosis not present

## 2016-09-28 LAB — CBC
HCT: 33.1 % — ABNORMAL LOW (ref 36.0–46.0)
Hemoglobin: 10.6 g/dL — ABNORMAL LOW (ref 12.0–15.0)
MCHC: 32.2 g/dL (ref 30.0–36.0)
MCV: 88.7 fl (ref 78.0–100.0)
PLATELETS: 210 10*3/uL (ref 150.0–400.0)
RBC: 3.73 Mil/uL — ABNORMAL LOW (ref 3.87–5.11)
RDW: 21.2 % — AB (ref 11.5–15.5)
WBC: 5.5 10*3/uL (ref 4.0–10.5)

## 2016-09-28 LAB — COMPREHENSIVE METABOLIC PANEL
ALT: 6 U/L (ref 0–35)
AST: 25 U/L (ref 0–37)
Albumin: 3.6 g/dL (ref 3.5–5.2)
Alkaline Phosphatase: 117 U/L (ref 39–117)
BUN: 14 mg/dL (ref 6–23)
CALCIUM: 8.1 mg/dL — AB (ref 8.4–10.5)
CHLORIDE: 91 meq/L — AB (ref 96–112)
CO2: 37 meq/L — AB (ref 19–32)
CREATININE: 0.92 mg/dL (ref 0.40–1.20)
GFR: 64.69 mL/min (ref >60.00)
GLUCOSE: 111 mg/dL — AB (ref 70–99)
Potassium: 2.8 mEq/L — CL (ref 3.5–5.1)
Sodium: 137 mEq/L (ref 135–145)
Total Bilirubin: 2.9 mg/dL — ABNORMAL HIGH (ref 0.2–1.2)
Total Protein: 6.3 g/dL (ref 6.0–8.3)

## 2016-09-28 MED ORDER — POTASSIUM CHLORIDE CRYS ER 20 MEQ PO TBCR: 60.0000 meq | EXTENDED_RELEASE_TABLET | Freq: Once | ORAL | 0 refills | Status: DC

## 2016-09-28 NOTE — Telephone Encounter (Signed)
     I saw the pt on 1/23. She is on lasix which is being given by cardiologist.  1. Pls call in 60 meqs of oral potassium chloride supplement x 1 dose.  2. More importantly, pls tell the pt that her K is low from lasix being given by cardiology or pcp. She needs to call PCP office or cards office since she will need maintenance KcL as she is on chronic lasix.    Thanks!  Let me know if you can NOT get hold of pt.   Monica Becton, MD 09/28/2016, 3:32 PM [facility] Pager [phone] After 3 pm or if no answer, call [phone]

## 2016-09-28 NOTE — Telephone Encounter (Signed)
Received a call from the lab downstairs. Pt has critical lab valve. K+ is 2.8.  AD - please advise. Thanks.

## 2016-09-28 NOTE — Telephone Encounter (Signed)
Patient is calling to inform Dr. Gwenlyn Found that her potassium was low and that the doctor is "sending her some." Thanks.

## 2016-09-28 NOTE — Telephone Encounter (Signed)
Routed to MD as FYI  K+ 28mq on med list K+ 666m one time dose for repletion ordered by JoRush LandmarkMD

## 2016-09-28 NOTE — Telephone Encounter (Signed)
Spoke with pt, aware of recs.  Potassium called in to preferred pharmacy.  Pt will follow up with cards regarding low potassium.  Nothing further needed.  Forwarding back to AD just as FYI- no need for response.

## 2016-09-29 ENCOUNTER — Encounter: Payer: Self-pay | Admitting: Family Medicine

## 2016-10-02 DIAGNOSIS — I5040 Unspecified combined systolic (congestive) and diastolic (congestive) heart failure: Secondary | ICD-10-CM | POA: Diagnosis not present

## 2016-10-02 DIAGNOSIS — J9610 Chronic respiratory failure, unspecified whether with hypoxia or hypercapnia: Secondary | ICD-10-CM | POA: Diagnosis not present

## 2016-10-02 DIAGNOSIS — R269 Unspecified abnormalities of gait and mobility: Secondary | ICD-10-CM | POA: Diagnosis not present

## 2016-10-04 ENCOUNTER — Ambulatory Visit: Payer: PPO | Admitting: Cardiovascular Disease

## 2016-10-06 ENCOUNTER — Encounter (HOSPITAL_COMMUNITY): Payer: PPO

## 2016-10-06 ENCOUNTER — Ambulatory Visit (HOSPITAL_COMMUNITY)
Admission: RE | Admit: 2016-10-06 | Discharge: 2016-10-06 | Disposition: A | Discharge status: Home / Self Care | Payer: PPO | Source: Ambulatory Visit | Attending: Pulmonary Disease | Admitting: Pulmonary Disease

## 2016-10-06 ENCOUNTER — Ambulatory Visit (HOSPITAL_COMMUNITY): Payer: PPO

## 2016-10-06 ENCOUNTER — Telehealth: Payer: Self-pay | Admitting: Physician Assistant

## 2016-10-06 ENCOUNTER — Ambulatory Visit (INDEPENDENT_AMBULATORY_CARE_PROVIDER_SITE_OTHER)
Admission: RE | Admit: 2016-10-06 | Discharge: 2016-10-06 | Disposition: A | Discharge status: Home / Self Care | Payer: PPO | Source: Ambulatory Visit | Attending: Internal Medicine | Admitting: Internal Medicine

## 2016-10-06 ENCOUNTER — Encounter (HOSPITAL_COMMUNITY)
Admission: RE | Admit: 2016-10-06 | Discharge: 2016-10-06 | Disposition: A | Discharge status: Home / Self Care | Payer: PPO | Source: Ambulatory Visit | Attending: Pulmonary Disease | Admitting: Pulmonary Disease

## 2016-10-06 DIAGNOSIS — I517 Cardiomegaly: Secondary | ICD-10-CM | POA: Diagnosis not present

## 2016-10-06 DIAGNOSIS — R0902 Hypoxemia: Secondary | ICD-10-CM | POA: Diagnosis not present

## 2016-10-06 DIAGNOSIS — R0602 Shortness of breath: Secondary | ICD-10-CM

## 2016-10-06 DIAGNOSIS — I272 Pulmonary hypertension, unspecified: Secondary | ICD-10-CM

## 2016-10-06 DIAGNOSIS — J90 Pleural effusion, not elsewhere classified: Secondary | ICD-10-CM | POA: Insufficient documentation

## 2016-10-06 DIAGNOSIS — J9811 Atelectasis: Secondary | ICD-10-CM | POA: Diagnosis not present

## 2016-10-06 DIAGNOSIS — R05 Cough: Secondary | ICD-10-CM | POA: Diagnosis not present

## 2016-10-06 LAB — BLOOD GAS, ARTERIAL
Acid-Base Excess: 10 mmol/L — ABNORMAL HIGH (ref 0.0–2.0)
BICARBONATE: 34.7 mmol/L — AB (ref 20.0–28.0)
Drawn by: 227661
O2 Content: 4 L/min
O2 Saturation: 91.7 %
PATIENT TEMPERATURE: 98.6
PO2 ART: 65 mmHg — AB (ref 83.0–108.0)
pCO2 arterial: 53.1 mmHg — ABNORMAL HIGH (ref 32.0–48.0)
pH, Arterial: 7.431 (ref 7.350–7.450)

## 2016-10-06 MED ORDER — TECHNETIUM TC 99M DIETHYLENETRIAME-PENTAACETIC ACID: 30.0000 | Freq: Once | INTRAVENOUS | Status: DC | PRN

## 2016-10-06 MED ORDER — TECHNETIUM TO 99M ALBUMIN AGGREGATED
4.0000 | Freq: Once | INTRAVENOUS | Status: AC | PRN
  Administered 2016-10-06: 4 via INTRAVENOUS

## 2016-10-06 NOTE — Telephone Encounter (Signed)
New message      Calling to let the nurse know that she will be faxing a CMN form to you for the PA to complete.  Please let her know if you do not receive it

## 2016-10-07 ENCOUNTER — Telehealth: Payer: Self-pay | Admitting: Internal Medicine

## 2016-10-07 ENCOUNTER — Institutional Professional Consult (permissible substitution): Payer: PPO | Admitting: Pulmonary Disease

## 2016-10-07 NOTE — Telephone Encounter (Signed)
[name]  You saw this patient 09/27/16 . Results of CT came to my inbox because was rodered on my name. Please address  Thanks  Dr. Brand Males, M.D., [facility].C.P Pulmonary and Critical Care Medicine Staff Physician [place] [facility] Pager: [phone], If no answer or between  15:00h - 7:00h: call [phone]  10/07/2016 8:48 AM    Dg Chest 2 View  Result Date: 10/06/2016 CLINICAL DATA:  Chronic cough.  Pulmonary hypertension. EXAM: CHEST  2 VIEW COMPARISON:  06/10/2016 FINDINGS: There is new opacity at the right lung base, in part due to a pleural effusion, with associated basilar opacity which may reflect atelectasis or pneumonia. There is mild central vascular congestion. Prominent bronchovascular markings are noted bilaterally similar to the prior study. No left pleural effusion. No pneumothorax. Cardiac silhouette is mildly enlarged. No mediastinal or hilar masses. There stable bilateral shoulder prostheses. Skeletal structures are demineralized. IMPRESSION: 1. Right lung base opacity reflecting combination of a small pleural effusion with either atelectasis or pneumonia. No overt pulmonary edema. Stable mild cardiomegaly. Electronically Signed   By: Lajean Manes M.D.   On: 10/06/2016 09:38   Ct Chest High Resolution  Result Date: 10/06/2016 CLINICAL DATA:  Severe dyspnea and hypoxemia. EXAM: CT CHEST WITHOUT CONTRAST TECHNIQUE: Multidetector CT imaging of the chest was performed following the standard protocol without intravenous contrast. High resolution imaging of the lungs, as well as inspiratory and expiratory imaging, was performed. COMPARISON:  Chest radiograph from earlier today. 10/24/2010 chest CT angiogram. FINDINGS: Motion degraded scan. Cardiovascular: Moderate cardiomegaly. Trace pericardial effusion/ thickening. Left main, left anterior descending, left circumflex and right coronary atherosclerosis. Atherosclerotic nonaneurysmal  thoracic aorta. Dilated main pulmonary artery (3.7 cm diameter). Mediastinum/Nodes: No discrete thyroid nodules. Unremarkable esophagus. No axillary adenopathy. Newly mildly enlarged 1.1 cm right paratracheal node (series 2/ image 34). No additional pathologically enlarged mediastinal or gross hilar nodes on this noncontrast scan. Lungs/Pleura: No pneumothorax. Small to moderate dependent right pleural effusion. No left pleural effusion. Posterior left upper lobe 4 mm solid pulmonary nodule (series 3/ image 30) is stable back to 10/24/2010 and considered benign. No lung masses or additional significant pulmonary nodules in the aerated portions of the lungs. There is interlobular septal thickening throughout both lungs. There is a mosaic attenuation throughout both lungs, favored to represent mosaic perfusion, with no convincing significant air trapping on the limited expiration sequence. There is peripheral consolidation and volume loss in the right middle lobe and right lower lobe, most consistent with moderate compressive atelectasis. No significant regions of subpleural reticulation, traction bronchiectasis, architectural distortion or frank honeycombing. Upper abdomen: Unremarkable. Musculoskeletal: No aggressive appearing focal osseous lesions. Marked thoracic spondylosis. Partially visualized right shoulder hemiarthroplasty and total left shoulder arthroplasty. Mild anasarca. IMPRESSION: 1. No evidence of interstitial lung disease. 2. Spectrum of findings suggestive of congestive heart failure, including moderate cardiomegaly, diffuse interlobular septal thickening suggesting pulmonary edema, mild anasarca and small to moderate dependent right pleural effusion. 3. Trace pericardial effusion/thickening. 4. Moderate compressive atelectasis at the right lung base. 5. Dilated main pulmonary artery, suggesting pulmonary arterial hypertension. Mosaic attenuation throughout both lungs, favored to represent mosaic  perfusion due to pulmonary vascular disease. 6. Aortic atherosclerosis. Left main and 3 vessel coronary atherosclerosis. Electronically Signed   By: Ilona Sorrel M.D.   On: 10/06/2016 13:07   Nm Pulmonary Per & Vent  Result Date: 10/06/2016 CLINICAL DATA:  Short of breath.  Pulmonary hypertension. EXAM: NUCLEAR  MEDICINE VENTILATION - PERFUSION LUNG SCAN TECHNIQUE: Ventilation images were obtained in multiple projections using inhaled aerosol Tc-65mDTPA. Perfusion images were obtained in multiple projections after intravenous injection of Tc-936mAA. RADIOPHARMACEUTICALS:  Thirty mCi Technetium-9923mPA aerosol inhalation and 4 mCi Technetium-6m69m IV COMPARISON:  Radiograph 10/06/2016, CT 10/06/2016 FINDINGS: Ventilation: There is heterogeneity of inhaled radiotracer. Patient coughing during exam. Absent ventilation to the RIGHT lower lobe anterior posteriorly. Perfusion: No wedge shaped peripheral perfusion defects to suggest acute pulmonary embolism. Absent profusion to the RIGHT lower lobe matches ventilation pattern as well as a large effusion on comparison CT. IMPRESSION: 1. No evidence acute pulmonary embolism. 2. Decreased ventilation and perfusion to the RIGHT lower lobe corresponds to the large effusion comparison CT. Electronically Signed   By: StewSuzy Bouchard.   On: 10/06/2016 12:01

## 2016-10-10 ENCOUNTER — Telehealth: Payer: Self-pay

## 2016-10-10 NOTE — Telephone Encounter (Signed)
Error

## 2016-10-11 NOTE — Telephone Encounter (Signed)
[name],  pls tell the pt I saw the chest ct scan.  I can discuss results on f/u.  We saw pulmonary edema and volume overload for which she should be on diuretics as she is. No PNA. No scarred lungs seen. Thanks   J. Shirl Harris, MD 10/11/2016, 6:32 AM [place] [facility] Pager [phone] After 3 pm or if no answer, call [phone]

## 2016-10-12 ENCOUNTER — Encounter: Payer: Self-pay | Admitting: Cardiovascular Disease

## 2016-10-12 ENCOUNTER — Ambulatory Visit (INDEPENDENT_AMBULATORY_CARE_PROVIDER_SITE_OTHER): Payer: PPO | Admitting: Cardiovascular Disease

## 2016-10-12 VITALS — BP 105/67 | HR 82 | Ht 62.0 in | Wt 290.0 lb

## 2016-10-12 DIAGNOSIS — I272 Pulmonary hypertension, unspecified: Secondary | ICD-10-CM | POA: Diagnosis not present

## 2016-10-12 DIAGNOSIS — I10 Essential (primary) hypertension: Secondary | ICD-10-CM

## 2016-10-12 DIAGNOSIS — G4733 Obstructive sleep apnea (adult) (pediatric): Secondary | ICD-10-CM | POA: Diagnosis not present

## 2016-10-12 DIAGNOSIS — I482 Chronic atrial fibrillation, unspecified: Secondary | ICD-10-CM

## 2016-10-12 NOTE — Assessment & Plan Note (Signed)
History of progressive elevation of pulmonary pressures. Her PA pressures were 61 last measured and have increased to 80 by recent 2-D echo. A V/Q state scan did not show fixed defects and a CT scan ruled out chronic pulmonary emboli. This may be related to obstructive sleep apnea. She may benefit from being on pulmonary artery vasodilators. She is seeing a pulmonologist for further evaluation. A CT scan did show calcification in all 3 coronary arteries however recent Myoview stress test was nonischemic. She has never had a cardiac catheterization.

## 2016-10-12 NOTE — Patient Instructions (Signed)
Medications Your physician recommends that you continue on your current medications as directed. Please refer to the Current Medication list given to you today.   Follow-up We request that you follow-up in: 3 months with an extender and in 6 months with Dr Andria Rhein will receive a reminder letter in the mail two months in advance. If you don't receive a letter, please call our office to schedule the follow-up appointment.

## 2016-10-12 NOTE — Telephone Encounter (Signed)
[phone] pt calling back

## 2016-10-12 NOTE — Telephone Encounter (Signed)
Pt aware of results and voiced her understanding. Nothing further needed.

## 2016-10-12 NOTE — Telephone Encounter (Signed)
lmom tcb x1

## 2016-10-12 NOTE — Assessment & Plan Note (Signed)
History of obstructive sleep apnea scheduled for an outpatient sleep study in the next several weeks.

## 2016-10-12 NOTE — Assessment & Plan Note (Signed)
History of chronic atrial fibrillation rate controlled on Xarelto oral anticoagulation.

## 2016-10-12 NOTE — Assessment & Plan Note (Signed)
History of hypertension blood pressure measured 105/67. She is on atenolol. Continue current meds at current dosing

## 2016-10-12 NOTE — Assessment & Plan Note (Signed)
History of hyperlipidemia not on statin therapy with lipid profile performed 07/20/16 revealing a total cholesterol of 142,, LDL of 86 and HDL of 30.

## 2016-10-12 NOTE — Progress Notes (Signed)
10/12/2016 [name]   17-Sep-1950  [id number]  Primary Physician Stacy Dawson, MD Primary Cardiologist: Stacy Harp MD [name]  HPI:   The patient is a 66 year old, morbidly overweight, divorced Caucasian female, mother of 56, grandmother to 2 grandchildren who I last saw in the office 08/02/16. She is accompanied by her brother body and sister [name] today. She has a history of hypertension and hyperlipidemia. She has had a stroke in the past and has paroxysmal A-fib now in chronic A-fib on Xarelto anticoagulation. She is rate controlled. She has had an uvulopalatopharyngoplasty which improved her symptoms of sleep apnea. Dr. Shanon [name] follows her lipid profile and apparently recently stopped her Zocor. A 2D echo showed normal LV function with mild left atrial enlargement and a Myoview stress test performed December 08, 2010, was low risk. She developed a "viral illness" back in September and subsequent to that has had progressive dyspnea on exertion and lower extremity edema. She was seen by one of our mid-level providers in October and 2-D echo was normal. Her diuretics were doubled. She was admitted with cellulitis in November. Arterial and venous Dopplers were normal. She continues to have severe dyspnea on exertion and 3+ pitting edema although she missed a increasing diuresis since her Lasix was doubled. She saw [name] several weeks after my last office visit on 09/29/15 and was clearly improving. A subsequent 2-D echo was normal. Her weight has come down 40 pounds and her edema has resolved as has her dyspnea. Her BMP likewise has come down from 500-300. Since I saw her in the office in February this year she has had a left shoulder replacement by Dr. Veverly [name] . Since being discharged from the rehabilitation facility after shoulder surgery she's noticed increasing dyspnea on exertion. She appears to be adequately diuresed. She currently requires 2 L of nasal  O2. I'm concerned that she has another cause of dyspnea yet to be diagnosed. She does have pulmonary hypertension by her last 2-D echo in January 2017 with PA pressures of 60 mmHg which was subsequently shown to increase to 80 by 2-D echo performed 08/24/16. She is on high-dose Lasix and takes Zaroxolyn intermittently. A VQ scan showed no fixed defects and a CT a showed findings consistent with pulmonary hypertension. She did have coronary calcification in left main and all 3 vessels although subsequent Myoview stress test was nonischemic.    Current Outpatient Prescriptions  Medication Sig Dispense Refill  . allopurinol (ZYLOPRIM) 100 MG tablet TAKE 2 TABLETS BY MOUTH EVERY DAY 180 tablet 1  . atenolol (TENORMIN) 50 MG tablet Take 1 tablet by mouth in the AM and 0.5 tablet by mouth in the PM 135 tablet 3  . colchicine 0.6 MG tablet Take 0.6 mg by mouth daily as needed.    . diclofenac sodium (VOLTAREN) 1 % GEL Apply 2 g topically 2 (two) times daily as needed (for arthritis).     . furosemide (LASIX) 40 MG tablet TAKE 1 TABLET BY MOUTH EVERY DAY FOR 3 DAYS THEN TAKE 1/2 TABLET BY MOUTH EVERY DAY (Patient taking differently: Take 1 tablet Daily) 90 tablet 3  . KLOR-CON 10 10 MEQ tablet TAKE 1 TABLET EVERY DAY 30 tablet 2  . levothyroxine (SYNTHROID, LEVOTHROID) 75 MCG tablet TAKE 1 TABLET BY MOUTH EVERY DAY 90 tablet 2  . methocarbamol (ROBAXIN) 500 MG tablet Take 500 mg by mouth every 6 (six) hours as needed for muscle spasms.    Marland Kitchen  metolazone (ZAROXOLYN) 2.5 MG tablet Take as directed. 90 tablet 3  . Omega-3 Fatty Acids (FISH OIL) 1000 MG CAPS Take 1,000 mg by mouth daily.    . potassium chloride (KLOR-CON 10) 10 MEQ tablet Take 1 tablet (10 mEq total) by mouth daily. (Patient taking differently: Take 20 mEq by mouth daily. ) 30 tablet 2  . rivaroxaban (XARELTO) 20 MG TABS tablet Take 1 tablet (20 mg total) by mouth daily with supper. 28 tablet 0  . traMADol (ULTRAM) 50 MG tablet TAKE 1 TABLET 3  TIMES A DAY AS NEEDED FOR PAIN 90 tablet 0  . potassium chloride SA (KLOR-CON M20) 20 MEQ tablet Take 3 tablets (60 mEq total) by mouth once. 3 tablet 0   No current facility-administered medications for this visit.     Allergies  Allergen Reactions  . Ambien [Zolpidem Tartrate]     Amnesia and fall   . Prevacid [Lansoprazole] Swelling  . [name] Inhibitors Cough  . Adhesive [Tape] Other (See Comments)    Adhesive tape and band aids " irritate, " causes blisters and pulls skin when removing. Okay to use paper tape  . Benadryl [Diphenhydramine Hcl]     topical  . Keflex [Cephalexin] Swelling    Swelling in ankles, feet  . Losartan     Elevated creatinine and swelling  . Motrin [Ibuprofen] Other (See Comments)    ANKLE EDEMA   . Prilosec [Omeprazole]     Swelling in ankles.    Social History   Social History  . Marital status: Divorced    Spouse name: N/A  . Number of children: N/A  . Years of education: N/A   Occupational History  . Not on file.   Social History Main Topics  . Smoking status: Never Smoker  . Smokeless tobacco: Never Used  . Alcohol use 0.6 oz/week    1 Glasses of wine per week     Comment: occ  . Drug use: No  . Sexual activity: No   Other Topics Concern  . 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  Cardiovascular: negative for chest pain, dyspnea on exertion, edema, orthopnea, palpitations, paroxysmal nocturnal dyspnea or shortness of breath Dermatological: negative for rash Respiratory: negative for cough or wheezing Urologic: negative for hematuria Abdominal: negative for nausea, vomiting, diarrhea, bright red blood per rectum, melena, or hematemesis Neurologic: negative for visual changes, syncope, or dizziness All other systems reviewed and  are otherwise negative except as noted above.    Blood pressure 105/67, pulse 82, height _0  (1.575 m), weight 290 lb (131.5 kg), last menstrual period 05/06/2013, SpO2 (!) 86 %.  General appearance: alert and no distress Neck: no adenopathy, no carotid bruit, no JVD, supple, symmetrical, trachea midline and thyroid not enlarged, symmetric, no tenderness/mass/nodules Lungs: clear to auscultation bilaterally Heart: irregularly irregular rhythm Extremities: 2-3+ pitting edema bilaterally  EKG atrial fibrillation with a ventricular response of 82, right axis deviation and low limb voltage. There was reverse R-wave progression. Procedure reviewed this EKG.  ASSESSMENT AND PLAN:   HYPERLIPIDEMIA History of hyperlipidemia not on statin therapy with lipid profile performed 07/20/16 revealing a total cholesterol of 142,, LDL of 86 and HDL of 30.  Chronic  atrial fibrillation (HCC) History of chronic atrial fibrillation rate controlled on Xarelto oral anticoagulation.  OSA (obstructive sleep apnea) History of obstructive sleep apnea scheduled for an outpatient sleep study in the next several weeks.  Hypertension History of hypertension blood pressure measured 105/67. She is on atenolol. Continue current meds at current dosing  Pulmonary hypertension History of progressive elevation of pulmonary pressures. Her PA pressures were 61 last measured and have increased to 80 by recent 2-D echo. A V/Q state scan did not show fixed defects and a CT scan ruled out chronic pulmonary emboli. This may be related to obstructive sleep apnea. She may benefit from being on pulmonary artery vasodilators. She is seeing a pulmonologist for further evaluation. A CT scan did show calcification in all 3 coronary arteries however recent Myoview stress test was nonischemic. She has never had a cardiac catheterization.      Stacy Harp MD FACP,FACC,FAHA, [facility] 10/12/2016 9:55 AM

## 2016-10-13 NOTE — Progress Notes (Signed)
Happy to [place]  JJB

## 2016-10-13 NOTE — Progress Notes (Signed)
Thanks  so much! W

## 2016-10-14 ENCOUNTER — Other Ambulatory Visit: Payer: Self-pay | Admitting: Cardiovascular Disease

## 2016-10-17 ENCOUNTER — Telehealth: Payer: Self-pay

## 2016-10-17 NOTE — Telephone Encounter (Signed)
   Thank you for trying.   I guess we will do the sleep study when her insurance approves it.   Monica Becton, MD 10/17/2016, 4:00 PM [facility] Pager [phone] After 3 pm or if no answer, call [phone]

## 2016-10-17 NOTE — Telephone Encounter (Signed)
AD  Please see previous message from [facility] in regards to pt. Sleep study  Notes Recorded by Rush Landmark, MD on 10/07/2016 at 4:59 PM EST abg was ok on the 4L. She needs a sleep study in the lab. Do you know when ? Needs to be done soon. thanks

## 2016-10-17 NOTE — Telephone Encounter (Signed)
[facility]   Can you see if pt. Sleep study can be placed sooner than March 11  Notes Recorded by Rush Landmark, MD on 10/07/2016 at 4:59 PM EST abg was ok on the 4L. She needs a sleep study in the lab. Do you know when ? Needs to be done soon. thanks

## 2016-10-17 NOTE — Telephone Encounter (Signed)
I called Sleep Lab.  There is opening for this week on Wed or Friday.  Study has not been precerted yet due to initial study not scheduled until 3/11.  Pt has [employer] & this takes 2 weeks to get a precert.  Went ahead & pulled out for [facility] to get precerted.  I checked with [facility] to see if there is opening 2 weeks from now & there is not.  I verified pt is on cancellation list.  I spoke to pt & notified her no openings at this time until 3/11 unless she want to go to [facility] for study & she states she does not.

## 2016-10-25 ENCOUNTER — Other Ambulatory Visit: Payer: Self-pay | Admitting: Cardiovascular Disease

## 2016-10-25 DIAGNOSIS — D509 Iron deficiency anemia, unspecified: Secondary | ICD-10-CM

## 2016-10-25 DIAGNOSIS — E876 Hypokalemia: Secondary | ICD-10-CM

## 2016-10-31 ENCOUNTER — Telehealth: Payer: Self-pay | Admitting: Cardiovascular Disease

## 2016-10-31 NOTE — Telephone Encounter (Signed)
New message    Pt is calling about going to a pulmonary doctor. She states she is not going to be able to switch doctors at the pulmonary doctor. She has an appt on Wednesday and needs to know if she should keep the appt or cancel it.

## 2016-10-31 NOTE — Telephone Encounter (Signed)
Spoke with pt states that she has seen Dr Corrie Dandy, she states that at her last appt with d Dr Gwenlyn Found he stated that he did not want her to see him and that he wanted her to see Dr Lake Bells. She states that she called and tried to change and they told her that this is not their policy, and that once you see a doctor you need to continue with them. She states that dr Gwenlyn Found told her to call and he would fix this if she had any problem. Her appt is on 11-03-16 with Dr DeDios and would like to know if she can keep this appt or see if Dr Gwenlyn Found can get her in this week with Dr Lake Bells. She states that she has been trying to get in to pulmonary for a long time. Please advise

## 2016-11-01 NOTE — Telephone Encounter (Signed)
Follow Up   Pt has not received a phone call. States Dr. Gwenlyn Found would help get her an appointment and she states no one has helped. Requests a call back soon.

## 2016-11-01 NOTE — Telephone Encounter (Signed)
Spoke to pt. She stated she was unable to get appt with Dr. Lake Bells and did not know why. She stated [facility] cancelled her appts with Dr. Corrie Dandy and would not tell her why she could not see Dr. Lake Bells.  Spoke with [facility]. Dr. Lake Bells does not have any availability until April. They stated pt did not want to wait that long so they scheduled her with Dr. Vaughan Browner tomorrow at 2:45. They also stated if pt does not mind waiting until April, they can go ahead and schedule an appt for her with Dr. Lake Bells.  Spoke with pt again to inform her of this. Pt will not wait that long. She stated she has been trying to get an appt and that Dr. Gwenlyn Found said he could get her in to see Dr. Lake Bells. She does not want to see other doctors. Pt asked me to speak with Dr. Gwenlyn Found to see what can be done. She stated she is feeling very distraught about this.   Told pt Dr. Gwenlyn Found will be in the office tomorrow and I will give her a call when I know something. Pt verbalized thanks and understanding.

## 2016-11-01 NOTE — Telephone Encounter (Signed)
[name], can you look into this?

## 2016-11-02 ENCOUNTER — Encounter: Payer: Self-pay | Admitting: Pulmonary Disease

## 2016-11-02 ENCOUNTER — Ambulatory Visit: Payer: PPO | Admitting: Pulmonary Disease

## 2016-11-02 ENCOUNTER — Encounter: Payer: Self-pay | Admitting: Cardiovascular Disease

## 2016-11-02 DIAGNOSIS — J9610 Chronic respiratory failure, unspecified whether with hypoxia or hypercapnia: Secondary | ICD-10-CM | POA: Diagnosis not present

## 2016-11-02 DIAGNOSIS — I5040 Unspecified combined systolic (congestive) and diastolic (congestive) heart failure: Secondary | ICD-10-CM | POA: Diagnosis not present

## 2016-11-02 DIAGNOSIS — R269 Unspecified abnormalities of gait and mobility: Secondary | ICD-10-CM | POA: Diagnosis not present

## 2016-11-02 NOTE — Telephone Encounter (Signed)
Follow up    Pt is calling to check on appt with a pulmonary doctor.

## 2016-11-03 ENCOUNTER — Ambulatory Visit: Payer: PPO | Admitting: Pulmonary Disease

## 2016-11-03 ENCOUNTER — Telehealth: Payer: Self-pay | Admitting: Pulmonary Disease

## 2016-11-03 NOTE — Telephone Encounter (Signed)
Spoke with pt. She has been scheduled with BQ per his request. Nothing further was needed.

## 2016-11-03 NOTE — Telephone Encounter (Signed)
AD are you okay with pt switching care to BQ? Thanks.

## 2016-11-03 NOTE — Telephone Encounter (Signed)
Dr. Lake Bells  Please see pt email

## 2016-11-03 NOTE — Telephone Encounter (Signed)
Spoke with pt. She has been scheduled with BQ on 11/04/16 at 1:30pm per his request. Nothing further was needed.

## 2016-11-04 ENCOUNTER — Inpatient Hospital Stay (HOSPITAL_COMMUNITY): Payer: PPO

## 2016-11-04 ENCOUNTER — Encounter: Payer: Self-pay | Admitting: Pulmonary Disease

## 2016-11-04 ENCOUNTER — Inpatient Hospital Stay (HOSPITAL_COMMUNITY)
Admission: AD | Admit: 2016-11-04 | Discharge: 2016-11-17 | DRG: 286 | Disposition: A | Discharge status: Home / Self Care | Payer: PPO | Source: Ambulatory Visit | Attending: Pulmonary Disease | Admitting: Pulmonary Disease

## 2016-11-04 ENCOUNTER — Ambulatory Visit (HOSPITAL_BASED_OUTPATIENT_CLINIC_OR_DEPARTMENT_OTHER): Payer: PPO | Attending: Pulmonary Disease

## 2016-11-04 ENCOUNTER — Ambulatory Visit (INDEPENDENT_AMBULATORY_CARE_PROVIDER_SITE_OTHER): Payer: PPO | Admitting: Pulmonary Disease

## 2016-11-04 VITALS — BP 124/72 | HR 74 | Ht 61.0 in

## 2016-11-04 DIAGNOSIS — I482 Chronic atrial fibrillation, unspecified: Secondary | ICD-10-CM | POA: Diagnosis present

## 2016-11-04 DIAGNOSIS — I110 Hypertensive heart disease with heart failure: Secondary | ICD-10-CM | POA: Diagnosis not present

## 2016-11-04 DIAGNOSIS — R0902 Hypoxemia: Secondary | ICD-10-CM | POA: Diagnosis present

## 2016-11-04 DIAGNOSIS — M109 Gout, unspecified: Secondary | ICD-10-CM | POA: Diagnosis present

## 2016-11-04 DIAGNOSIS — M10011 Idiopathic gout, right shoulder: Secondary | ICD-10-CM

## 2016-11-04 DIAGNOSIS — E119 Type 2 diabetes mellitus without complications: Secondary | ICD-10-CM | POA: Diagnosis not present

## 2016-11-04 DIAGNOSIS — Z6841 Body Mass Index (BMI) 40.0 and over, adult: Secondary | ICD-10-CM

## 2016-11-04 DIAGNOSIS — I700 Atherosclerosis of aorta: Secondary | ICD-10-CM | POA: Diagnosis present

## 2016-11-04 DIAGNOSIS — E039 Hypothyroidism, unspecified: Secondary | ICD-10-CM | POA: Diagnosis present

## 2016-11-04 DIAGNOSIS — I509 Heart failure, unspecified: Secondary | ICD-10-CM

## 2016-11-04 DIAGNOSIS — I2721 Secondary pulmonary arterial hypertension: Secondary | ICD-10-CM | POA: Diagnosis present

## 2016-11-04 DIAGNOSIS — J90 Pleural effusion, not elsewhere classified: Secondary | ICD-10-CM | POA: Diagnosis present

## 2016-11-04 DIAGNOSIS — M1990 Unspecified osteoarthritis, unspecified site: Secondary | ICD-10-CM | POA: Diagnosis present

## 2016-11-04 DIAGNOSIS — E872 Acidosis: Secondary | ICD-10-CM | POA: Diagnosis present

## 2016-11-04 DIAGNOSIS — N390 Urinary tract infection, site not specified: Secondary | ICD-10-CM | POA: Diagnosis present

## 2016-11-04 DIAGNOSIS — G5730 Lesion of lateral popliteal nerve, unspecified lower limb: Secondary | ICD-10-CM | POA: Diagnosis not present

## 2016-11-04 DIAGNOSIS — J9611 Chronic respiratory failure with hypoxia: Secondary | ICD-10-CM | POA: Insufficient documentation

## 2016-11-04 DIAGNOSIS — E662 Morbid (severe) obesity with alveolar hypoventilation: Secondary | ICD-10-CM | POA: Diagnosis not present

## 2016-11-04 DIAGNOSIS — Z833 Family history of diabetes mellitus: Secondary | ICD-10-CM

## 2016-11-04 DIAGNOSIS — E118 Type 2 diabetes mellitus with unspecified complications: Secondary | ICD-10-CM | POA: Diagnosis not present

## 2016-11-04 DIAGNOSIS — Z7901 Long term (current) use of anticoagulants: Secondary | ICD-10-CM | POA: Diagnosis not present

## 2016-11-04 DIAGNOSIS — Z800 Family history of malignant neoplasm of digestive organs: Secondary | ICD-10-CM | POA: Diagnosis not present

## 2016-11-04 DIAGNOSIS — I272 Pulmonary hypertension, unspecified: Secondary | ICD-10-CM | POA: Diagnosis not present

## 2016-11-04 DIAGNOSIS — R0602 Shortness of breath: Secondary | ICD-10-CM | POA: Diagnosis present

## 2016-11-04 DIAGNOSIS — E876 Hypokalemia: Secondary | ICD-10-CM | POA: Diagnosis present

## 2016-11-04 DIAGNOSIS — I5032 Chronic diastolic (congestive) heart failure: Secondary | ICD-10-CM | POA: Diagnosis present

## 2016-11-04 DIAGNOSIS — I2723 Pulmonary hypertension due to lung diseases and hypoxia: Secondary | ICD-10-CM | POA: Diagnosis present

## 2016-11-04 DIAGNOSIS — Q782 Osteopetrosis: Secondary | ICD-10-CM

## 2016-11-04 DIAGNOSIS — G4733 Obstructive sleep apnea (adult) (pediatric): Secondary | ICD-10-CM | POA: Diagnosis not present

## 2016-11-04 DIAGNOSIS — I2510 Atherosclerotic heart disease of native coronary artery without angina pectoris: Secondary | ICD-10-CM | POA: Diagnosis not present

## 2016-11-04 DIAGNOSIS — I119 Hypertensive heart disease without heart failure: Secondary | ICD-10-CM | POA: Diagnosis present

## 2016-11-04 DIAGNOSIS — J9601 Acute respiratory failure with hypoxia: Secondary | ICD-10-CM | POA: Diagnosis not present

## 2016-11-04 DIAGNOSIS — J9621 Acute and chronic respiratory failure with hypoxia: Secondary | ICD-10-CM | POA: Diagnosis present

## 2016-11-04 DIAGNOSIS — Z9981 Dependence on supplemental oxygen: Secondary | ICD-10-CM

## 2016-11-04 DIAGNOSIS — T83511A Infection and inflammatory reaction due to indwelling urethral catheter, initial encounter: Secondary | ICD-10-CM | POA: Diagnosis not present

## 2016-11-04 DIAGNOSIS — Z888 Allergy status to other drugs, medicaments and biological substances status: Secondary | ICD-10-CM

## 2016-11-04 DIAGNOSIS — Z9119 Patient's noncompliance with other medical treatment and regimen: Secondary | ICD-10-CM

## 2016-11-04 DIAGNOSIS — E1165 Type 2 diabetes mellitus with hyperglycemia: Secondary | ICD-10-CM | POA: Diagnosis present

## 2016-11-04 DIAGNOSIS — I270 Primary pulmonary hypertension: Secondary | ICD-10-CM | POA: Diagnosis not present

## 2016-11-04 DIAGNOSIS — Z823 Family history of stroke: Secondary | ICD-10-CM

## 2016-11-04 DIAGNOSIS — Z5320 Procedure and treatment not carried out because of patient's decision for unspecified reasons: Secondary | ICD-10-CM | POA: Diagnosis present

## 2016-11-04 DIAGNOSIS — I517 Cardiomegaly: Secondary | ICD-10-CM | POA: Diagnosis not present

## 2016-11-04 DIAGNOSIS — Z8673 Personal history of transient ischemic attack (TIA), and cerebral infarction without residual deficits: Secondary | ICD-10-CM | POA: Diagnosis not present

## 2016-11-04 DIAGNOSIS — E538 Deficiency of other specified B group vitamins: Secondary | ICD-10-CM | POA: Diagnosis not present

## 2016-11-04 DIAGNOSIS — Z8249 Family history of ischemic heart disease and other diseases of the circulatory system: Secondary | ICD-10-CM | POA: Diagnosis not present

## 2016-11-04 DIAGNOSIS — R262 Difficulty in walking, not elsewhere classified: Secondary | ICD-10-CM | POA: Diagnosis not present

## 2016-11-04 DIAGNOSIS — Z96653 Presence of artificial knee joint, bilateral: Secondary | ICD-10-CM | POA: Diagnosis present

## 2016-11-04 DIAGNOSIS — J9622 Acute and chronic respiratory failure with hypercapnia: Secondary | ICD-10-CM | POA: Diagnosis not present

## 2016-11-04 DIAGNOSIS — M6281 Muscle weakness (generalized): Secondary | ICD-10-CM | POA: Diagnosis not present

## 2016-11-04 DIAGNOSIS — I5030 Unspecified diastolic (congestive) heart failure: Secondary | ICD-10-CM

## 2016-11-04 DIAGNOSIS — Z86010 Personal history of colonic polyps: Secondary | ICD-10-CM

## 2016-11-04 DIAGNOSIS — Z96612 Presence of left artificial shoulder joint: Secondary | ICD-10-CM | POA: Diagnosis present

## 2016-11-04 DIAGNOSIS — J9600 Acute respiratory failure, unspecified whether with hypoxia or hypercapnia: Secondary | ICD-10-CM | POA: Diagnosis not present

## 2016-11-04 DIAGNOSIS — F40240 Claustrophobia: Secondary | ICD-10-CM | POA: Diagnosis present

## 2016-11-04 DIAGNOSIS — R278 Other lack of coordination: Secondary | ICD-10-CM | POA: Diagnosis not present

## 2016-11-04 DIAGNOSIS — D519 Vitamin B12 deficiency anemia, unspecified: Secondary | ICD-10-CM | POA: Diagnosis not present

## 2016-11-04 DIAGNOSIS — E031 Congenital hypothyroidism without goiter: Secondary | ICD-10-CM

## 2016-11-04 DIAGNOSIS — I5031 Acute diastolic (congestive) heart failure: Secondary | ICD-10-CM

## 2016-11-04 DIAGNOSIS — G9340 Encephalopathy, unspecified: Secondary | ICD-10-CM | POA: Diagnosis present

## 2016-11-04 DIAGNOSIS — Z825 Family history of asthma and other chronic lower respiratory diseases: Secondary | ICD-10-CM | POA: Diagnosis not present

## 2016-11-04 DIAGNOSIS — E785 Hyperlipidemia, unspecified: Secondary | ICD-10-CM | POA: Diagnosis not present

## 2016-11-04 DIAGNOSIS — G4730 Sleep apnea, unspecified: Secondary | ICD-10-CM | POA: Diagnosis not present

## 2016-11-04 DIAGNOSIS — Z8262 Family history of osteoporosis: Secondary | ICD-10-CM | POA: Diagnosis not present

## 2016-11-04 DIAGNOSIS — F05 Delirium due to known physiological condition: Secondary | ICD-10-CM | POA: Diagnosis not present

## 2016-11-04 DIAGNOSIS — M1000 Idiopathic gout, unspecified site: Secondary | ICD-10-CM

## 2016-11-04 DIAGNOSIS — E02 Subclinical iodine-deficiency hypothyroidism: Secondary | ICD-10-CM

## 2016-11-04 DIAGNOSIS — I5033 Acute on chronic diastolic (congestive) heart failure: Secondary | ICD-10-CM | POA: Diagnosis not present

## 2016-11-04 DIAGNOSIS — Z96611 Presence of right artificial shoulder joint: Secondary | ICD-10-CM | POA: Diagnosis present

## 2016-11-04 DIAGNOSIS — G8929 Other chronic pain: Secondary | ICD-10-CM | POA: Diagnosis present

## 2016-11-04 DIAGNOSIS — K219 Gastro-esophageal reflux disease without esophagitis: Secondary | ICD-10-CM | POA: Diagnosis present

## 2016-11-04 DIAGNOSIS — I4891 Unspecified atrial fibrillation: Secondary | ICD-10-CM | POA: Diagnosis not present

## 2016-11-04 HISTORY — DX: Morbid (severe) obesity with alveolar hypoventilation: E66.2

## 2016-11-04 HISTORY — DX: Presence of unspecified artificial knee joint: Z96.659

## 2016-11-04 HISTORY — DX: Presence of unspecified artificial shoulder joint: Z96.619

## 2016-11-04 HISTORY — DX: Pulmonary hypertension, unspecified: I27.20

## 2016-11-04 HISTORY — DX: Hypertensive heart disease without heart failure: I11.9

## 2016-11-04 HISTORY — DX: Chronic atrial fibrillation, unspecified: I48.20

## 2016-11-04 LAB — COMPREHENSIVE METABOLIC PANEL
ALBUMIN: 3.3 g/dL — AB (ref 3.5–5.0)
ALK PHOS: 140 U/L — AB (ref 38–126)
ALT: 9 U/L — ABNORMAL LOW (ref 14–54)
ANION GAP: 13 (ref 5–15)
AST: 32 U/L (ref 15–41)
BILIRUBIN TOTAL: 2.7 mg/dL — AB (ref 0.3–1.2)
BUN: 13 mg/dL (ref 6–20)
CALCIUM: 9.2 mg/dL (ref 8.9–10.3)
CO2: 37 mmol/L — ABNORMAL HIGH (ref 22–32)
Chloride: 90 mmol/L — ABNORMAL LOW (ref 101–111)
Creatinine, Ser: 0.97 mg/dL (ref 0.44–1.00)
GFR, EST NON AFRICAN AMERICAN: 59 mL/min — AB (ref >60)
Glucose, Bld: 93 mg/dL (ref 65–99)
POTASSIUM: 3 mmol/L — AB (ref 3.5–5.1)
Sodium: 140 mmol/L (ref 135–145)
TOTAL PROTEIN: 6.4 g/dL — AB (ref 6.5–8.1)

## 2016-11-04 LAB — CBC WITH DIFFERENTIAL/PLATELET
BASOS ABS: 0 10*3/uL (ref 0.0–0.1)
BASOS PCT: 1 %
EOS ABS: 0.4 10*3/uL (ref 0.0–0.7)
EOS PCT: 6 %
HCT: 35.8 % — ABNORMAL LOW (ref 36.0–46.0)
Hemoglobin: 10.8 g/dL — ABNORMAL LOW (ref 12.0–15.0)
LYMPHS PCT: 11 %
Lymphs Abs: 0.7 10*3/uL (ref 0.7–4.0)
MCH: 28.3 pg (ref 26.0–34.0)
MCHC: 30.2 g/dL (ref 30.0–36.0)
MCV: 93.7 fL (ref 78.0–100.0)
MONO ABS: 0.5 10*3/uL (ref 0.1–1.0)
Monocytes Relative: 8 %
Neutro Abs: 4.6 10*3/uL (ref 1.7–7.7)
Neutrophils Relative %: 74 %
PLATELETS: 184 10*3/uL (ref 150–400)
RBC: 3.82 MIL/uL — AB (ref 3.87–5.11)
RDW: 20.2 % — AB (ref 11.5–15.5)
WBC: 6.1 10*3/uL (ref 4.0–10.5)

## 2016-11-04 LAB — BLOOD GAS, ARTERIAL
Acid-Base Excess: 11.6 mmol/L — ABNORMAL HIGH (ref 0.0–2.0)
BICARBONATE: 36.2 mmol/L — AB (ref 20.0–28.0)
DRAWN BY: 358491
O2 CONTENT: 6 L/min
O2 SAT: 89.2 %
PCO2 ART: 52.1 mmHg — AB (ref 32.0–48.0)
PO2 ART: 57.7 mmHg — AB (ref 83.0–108.0)
Patient temperature: 97.8
pH, Arterial: 7.454 — ABNORMAL HIGH (ref 7.350–7.450)

## 2016-11-04 LAB — MAGNESIUM: MAGNESIUM: 1.4 mg/dL — AB (ref 1.7–2.4)

## 2016-11-04 LAB — TSH: TSH: 6.286 u[IU]/mL — ABNORMAL HIGH (ref 0.350–4.500)

## 2016-11-04 LAB — PHOSPHORUS: PHOSPHORUS: 4.2 mg/dL (ref 2.5–4.6)

## 2016-11-04 LAB — BRAIN NATRIURETIC PEPTIDE: B NATRIURETIC PEPTIDE 5: 521.1 pg/mL — AB (ref 0.0–100.0)

## 2016-11-04 MED ORDER — METHOCARBAMOL 500 MG PO TABS: 500.0000 mg | ORAL_TABLET | Freq: Four times a day (QID) | ORAL | Status: DC | PRN

## 2016-11-04 MED ORDER — ATENOLOL 25 MG PO TABS: 50.0000 mg | ORAL_TABLET | Freq: Every day | ORAL | Status: DC

## 2016-11-04 MED ORDER — POTASSIUM CHLORIDE CRYS ER 20 MEQ PO TBCR
60.0000 meq | EXTENDED_RELEASE_TABLET | Freq: Once | ORAL | Status: AC
  Administered 2016-11-04: 60 meq via ORAL
  Filled 2016-11-04: qty 3

## 2016-11-04 MED ORDER — ALLOPURINOL 100 MG PO TABS
200.0000 mg | ORAL_TABLET | Freq: Every day | ORAL | Status: DC
  Administered 2016-11-05 – 2016-11-17 (×12): 200 mg via ORAL
  Filled 2016-11-04 (×13): qty 2

## 2016-11-04 MED ORDER — FUROSEMIDE 10 MG/ML IJ SOLN
40.0000 mg | Freq: Four times a day (QID) | INTRAMUSCULAR | Status: AC
  Administered 2016-11-04 (×2): 40 mg via INTRAVENOUS
  Filled 2016-11-04 (×2): qty 4

## 2016-11-04 MED ORDER — LEVOTHYROXINE SODIUM 75 MCG PO TABS
75.0000 ug | ORAL_TABLET | Freq: Every day | ORAL | Status: DC
  Administered 2016-11-05 – 2016-11-17 (×11): 75 ug via ORAL
  Filled 2016-11-04 (×13): qty 1

## 2016-11-04 MED ORDER — RIVAROXABAN 20 MG PO TABS
20.0000 mg | ORAL_TABLET | Freq: Every day | ORAL | Status: DC
  Administered 2016-11-04 – 2016-11-05 (×2): 20 mg via ORAL
  Filled 2016-11-04 (×2): qty 1

## 2016-11-04 MED ORDER — POTASSIUM CHLORIDE CRYS ER 10 MEQ PO TBCR
10.0000 meq | EXTENDED_RELEASE_TABLET | Freq: Every day | ORAL | Status: DC
  Administered 2016-11-05 – 2016-11-06 (×2): 10 meq via ORAL
  Filled 2016-11-04 (×3): qty 1

## 2016-11-04 MED ORDER — ASPIRIN EC 325 MG PO TBEC
325.0000 mg | DELAYED_RELEASE_TABLET | Freq: Every day | ORAL | Status: DC
  Administered 2016-11-05: 325 mg via ORAL
  Filled 2016-11-04 (×2): qty 1

## 2016-11-04 MED ORDER — ACETAMINOPHEN 325 MG PO TABS: 650.0000 mg | ORAL_TABLET | ORAL | Status: DC | PRN

## 2016-11-04 MED ORDER — TORSEMIDE 5 MG PO TABS: 20.0000 mg | ORAL_TABLET | Freq: Every day | ORAL | Status: DC

## 2016-11-04 MED ORDER — TORSEMIDE 20 MG PO TABS
20.0000 mg | ORAL_TABLET | Freq: Every day | ORAL | Status: DC
  Administered 2016-11-05 – 2016-11-06 (×2): 20 mg via ORAL
  Filled 2016-11-04 (×2): qty 1

## 2016-11-04 MED ORDER — ATENOLOL 25 MG PO TABS
25.0000 mg | ORAL_TABLET | Freq: Every day | ORAL | Status: DC
  Administered 2016-11-04 – 2016-11-13 (×10): 25 mg via ORAL
  Filled 2016-11-04 (×11): qty 1

## 2016-11-04 MED ORDER — SODIUM CHLORIDE 0.9 % IV SOLN
250.0000 mL | INTRAVENOUS | Status: DC | PRN
  Administered 2016-11-05: 250 mL via INTRAVENOUS

## 2016-11-04 MED ORDER — ATENOLOL 25 MG PO TABS
50.0000 mg | ORAL_TABLET | Freq: Every day | ORAL | Status: DC
  Administered 2016-11-05 – 2016-11-17 (×11): 50 mg via ORAL
  Filled 2016-11-04 (×3): qty 2
  Filled 2016-11-04 (×3): qty 1
  Filled 2016-11-04: qty 2
  Filled 2016-11-04 (×5): qty 1

## 2016-11-04 MED ORDER — ONDANSETRON HCL 4 MG/2ML IJ SOLN: 4.0000 mg | Freq: Four times a day (QID) | INTRAMUSCULAR | Status: DC | PRN

## 2016-11-04 NOTE — Assessment & Plan Note (Signed)
Continue allopurinol

## 2016-11-04 NOTE — Progress Notes (Signed)
Pt placed on BiPAP.  Setting adjusted for comfort.  Pt attempted to use the machine but due to the pressure and noise, pt asked to be removed from the machine.  Pt stated that the mask was uncomfortable.  Ask if pt would like to try another mask and showed pt nasal mask, however pt declined.  Pt was placed back on oximyzer.

## 2016-11-04 NOTE — Assessment & Plan Note (Signed)
Continue home Synthroid, check TSH

## 2016-11-04 NOTE — Assessment & Plan Note (Signed)
I suspect she has severe obstructive sleep apnea. She also has obesity hypoventilation syndrome based on her chronic resting hypercapnia seen on 10/06/2016 ABG.  Plan: BiPAP daily at bedtime Will need BiPAP titration study

## 2016-11-04 NOTE — Patient Instructions (Signed)
We are going to have you admitted to [facility] for next but I did workup.  For your chronic respiratory failure with hypoxemia: Keep using 6 L of oxygen with the Oxymizer at rest We will arrange for a bubble study to see if blood is shunting from the right side of your heart to the left side  For your pulmonary hypertension: We will arrange for a right heart catheterization  For likely obstructive sleep apnea: Follow-through with a sleep study tonight  For your congestive heart failure: Stop furosemide Start torsemide 20 mg daily Ms. your weight and record it every day  We will see you back in 2 weeks., Nurse practitioner or [place] visit

## 2016-11-04 NOTE — Progress Notes (Signed)
eLink Physician-Brief Progress Note Patient Name: [name] DOB: Mar 26, 1951 MRN: [id number]   Date of Service  11/04/2016  HPI/Events of Note    eICU Interventions  Hypokalemia, repleted      Intervention Category Intermediate Interventions: Electrolyte abnormality - evaluation and management  Simonne Maffucci 11/04/2016, 7:53 PM
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
## 2016-11-04 NOTE — Assessment & Plan Note (Signed)
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 Acutely, her oxygenation has worsened which I believe is due to decompensated heart failure.  Plan: Admit to [facility] telemetry Use 6 L high flow at rest Titrate O2 to maintain O2 saturation greater than 88%
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
## 2016-11-04 NOTE — Assessment & Plan Note (Signed)
She is volume overloaded on exam today.  Whether or not she has disproportionate pulmonary hypertension she is clearly volume overloaded and needs diuresis.  Plan: Check basic metabolic panel and magnesium IV Lasix now Check pro BNP Daily weights

## 2016-11-04 NOTE — Assessment & Plan Note (Signed)
Contributing significantly to overall burden of symptoms and disease. Counseled today on the importance of weight loss

## 2016-11-04 NOTE — Assessment & Plan Note (Signed)
Presumably related to heart failure.  Consider thoracentesis if does not improve with diuresis Chest x-ray

## 2016-11-04 NOTE — Assessment & Plan Note (Signed)
Telemetry monitoring  Continue home atenolol and Xarelto

## 2016-11-04 NOTE — Assessment & Plan Note (Signed)
Multifactorial problem. Clearly congestive heart failure, obesity hypoventilation syndrome and obstructive sleep apnea are contributing. There may be disproportionate pulmonary vascular resistance, but we don't know that yet because she has not had a right heart catheterization.  Plan: Consult the heart failure service for right heart catheterization

## 2016-11-05 ENCOUNTER — Encounter (HOSPITAL_COMMUNITY): Payer: Self-pay | Admitting: *Deleted

## 2016-11-05 DIAGNOSIS — E662 Morbid (severe) obesity with alveolar hypoventilation: Secondary | ICD-10-CM

## 2016-11-05 DIAGNOSIS — I2510 Atherosclerotic heart disease of native coronary artery without angina pectoris: Secondary | ICD-10-CM | POA: Diagnosis present

## 2016-11-05 HISTORY — DX: Morbid (severe) obesity with alveolar hypoventilation: E66.2

## 2016-11-05 LAB — BASIC METABOLIC PANEL
ANION GAP: 13 (ref 5–15)
BUN: 13 mg/dL (ref 6–20)
CHLORIDE: 91 mmol/L — AB (ref 101–111)
CO2: 36 mmol/L — ABNORMAL HIGH (ref 22–32)
Calcium: 9.2 mg/dL (ref 8.9–10.3)
Creatinine, Ser: 1.01 mg/dL — ABNORMAL HIGH (ref 0.44–1.00)
GFR calc non Af Amer: 56 mL/min — ABNORMAL LOW (ref >60)
Glucose, Bld: 108 mg/dL — ABNORMAL HIGH (ref 65–99)
Potassium: 3 mmol/L — ABNORMAL LOW (ref 3.5–5.1)
Sodium: 140 mmol/L (ref 135–145)

## 2016-11-05 LAB — CBC
HEMATOCRIT: 34.5 % — AB (ref 36.0–46.0)
HEMOGLOBIN: 10.4 g/dL — AB (ref 12.0–15.0)
MCH: 28.4 pg (ref 26.0–34.0)
MCHC: 30.1 g/dL (ref 30.0–36.0)
MCV: 94.3 fL (ref 78.0–100.0)
Platelets: 187 10*3/uL (ref 150–400)
RBC: 3.66 MIL/uL — ABNORMAL LOW (ref 3.87–5.11)
RDW: 20.3 % — ABNORMAL HIGH (ref 11.5–15.5)
WBC: 6.6 10*3/uL (ref 4.0–10.5)

## 2016-11-05 MED ORDER — MAGNESIUM SULFATE 4 GM/100ML IV SOLN
4.0000 g | Freq: Once | INTRAVENOUS | Status: AC
  Administered 2016-11-05: 4 g via INTRAVENOUS
  Filled 2016-11-05: qty 100

## 2016-11-05 MED ORDER — TRAMADOL HCL 50 MG PO TABS
50.0000 mg | ORAL_TABLET | Freq: Three times a day (TID) | ORAL | Status: DC | PRN
  Administered 2016-11-06 – 2016-11-16 (×13): 50 mg via ORAL
  Filled 2016-11-05 (×16): qty 1

## 2016-11-05 MED ORDER — METHOCARBAMOL 500 MG PO TABS
250.0000 mg | ORAL_TABLET | Freq: Four times a day (QID) | ORAL | Status: DC | PRN
  Administered 2016-11-05 – 2016-11-08 (×3): 250 mg via ORAL
  Filled 2016-11-05 (×4): qty 1

## 2016-11-05 MED ORDER — FUROSEMIDE 10 MG/ML IJ SOLN
80.0000 mg | Freq: Three times a day (TID) | INTRAMUSCULAR | Status: DC
  Administered 2016-11-05 – 2016-11-07 (×7): 80 mg via INTRAVENOUS
  Filled 2016-11-05 (×7): qty 8

## 2016-11-05 NOTE — Progress Notes (Signed)
Spoke w/ Magda Paganini R.N. With rapid response about patient cond.tonight to see if a better way to get O2 sats to pick up.Unable to get good sats on toes and fingers and ears. And forehead. Resp. Txs. Came by again Magda Paganini assess pt.o2 sats ranging from 82-92% on 7 liter [place] With Oximyzer on. No resp. distress noted. Dr. Jimmy Footman page and made aware of tonight events as well as urine output with I.V. Lasix. Patient wanting ultram for back pain. See order

## 2016-11-05 NOTE — Telephone Encounter (Signed)
See if we can get her an appointment with Dr Lake Bells  JJB

## 2016-11-05 NOTE — Progress Notes (Signed)
Subjective:    Patient ID: [name], female    DOB: 1951/05/07, 66 y.o.   MRN: [id number]  Synopsis: Referred in 2017 to the [facility] pulmonary clinic for evaluation of chronic respiratory failure with hypoxemia and likely pulmonary hypertension. She has a past medical history significant for stroke, diastolic heart failure, atrial fibrillation. She also has obstructive sleep apnea and is status post U P3 surgery.  She ambulated approximately 100 feet on 4 L with the Oxymizer and her O2 saturation dropped to 61% and her heart rate dropped to 41 while becoming cyanotic. It took approximately 10 minutes with an O2 flow rate at 6 L nasal cannula through an Oxymizer for her O2 saturation to reach 90% and her heart rate to improve to the 80s to 90s and her color to change to pink again.  baselne testing  Arterial blood gas: 10/06/2016 performed on oxygen: 7.43/53.1/65.0/34.7  Pulmonary function testing: January 2018: FVC 0.88 L, 32% predicted, normal ratio, DLCO 7. 10/13/1934 percent predicted  Imaging: 10/06/2016 high-resolution CT scan of the chest: Images independently reviewed by me today in clinic, there is normal pulmonary parenchyma with the exception of interlobular septal thickening and groundglass worse in the dependent sections of all lobes, there is a moderate size right-sided pleural effusion, pulmonary vascular engorgement. 10/06/2016 VQ scan: No evidence of pulmonary embolism, decreased ventilation and perfusion to the right lower lobe which corresponds to the large effusion on CT  Echocardiogram: December 2017: LVEF 55-60%, cannot assess diastolic function, left atrium moderately dilated, no mitral regurgitation, right atrium severely dilated, right ventricle cavity size moderately dilated, moderate tricuspid regurgitation, pulmonary artery peak systolic pressure estimate 77 mmHg  PCCM admitted   EVENTS 11/04/2016 - admit   SUBJECTIVE/OVERNIGHT/INTERVAL  HX 3/3?18 - feels very air hungry. On 6L [place] pulse ox 84-88%. Claustrophobic with bipap last night per RN. On diursis but not making much urine. She is worried abuot add on aspirin to xarelto due to bleeding risk  At hoome only on lsix 55m daily and metalazone. Currently on tab demadex 220mdaily  OBJECTIVE Vitals:   11/05/16 0741 11/05/16 1021 11/05/16 1023 11/05/16 1113  BP: 120/71 (!) 115/52 108/67   Pulse: 92  82   Resp: (!) 24     Temp: 97.6 F (36.4 C)     TempSrc: Oral     SpO2: (!) 87%   (!) 88%  Weight:      Height:         General Appearance:    Looks criticall ill OBESE - morbidy +  Head:    Normocephalic, without obvious abnormality, atraumatic  Eyes:    PERRL - yes, conjunctiva/corneas - clear      Ears:    Normal external ear canals, both ears  Nose:   NG tube - no  Throat:  ETT TUBE - no , OG tube - no  Neck:   Supple,  No enlargement/tenderness/nodules     Lungs:     Clear to auscultation bilaterally but diminished overall,  Chest wall:    No deformity  Heart:    S1 and S2 normal, no murmur, CVP -   Abdomen:     Soft, no masses, no organomegaly  Genitalia:    Not done  Rectal:   not done  Extremities:   Extremities- chronic edema     Skin:   Intact in exposed areas .      Neurologic:   Sedation - none -> RASS - +2 .  Moves all 4s - yes. CAM-ICU - neg for delirum . Orientation - fully oritened      PULMONARY  Recent Labs Lab 11/04/16 1630  PHART 7.454*  PCO2ART 52.1*  PO2ART 57.7*  HCO3 36.2*  O2SAT 89.2    CBC  Recent Labs Lab 11/04/16 1656 11/05/16 0319  HGB 10.8* 10.4*  HCT 35.8* 34.5*  WBC 6.1 6.6  PLT 184 187    COAGULATION No results for input(s): INR in the last 168 hours.  CARDIAC  No results for input(s): TROPONINI in the last 168 hours. No results for input(s): PROBNP in the last 168 hours.   CHEMISTRY  Recent Labs Lab 11/04/16 1656 11/05/16 0319  NA 140 140  K 3.0* 3.0*  CL 90* 91*  CO2 37* 36*  GLUCOSE 93  108*  BUN 13 13  CREATININE 0.97 1.01*  CALCIUM 9.2 9.2  MG 1.4*  --   PHOS 4.2  --    Estimated Creatinine Clearance: 68.5 mL/min (by C-G formula based on SCr of 1.01 mg/dL (H)).   LIVER  Recent Labs Lab 11/04/16 1656  AST 32  ALT 9*  ALKPHOS 140*  BILITOT 2.7*  PROT 6.4*  ALBUMIN 3.3*     INFECTIOUS No results for input(s): LATICACIDVEN, PROCALCITON in the last 168 hours.   ENDOCRINE CBG (last 3)  No results for input(s): GLUCAP in the last 72 hours.       IMAGING x48h  - image(s) personally visualized  -   highlighted in bold Dg Chest Port 1 View  Result Date: 11/04/2016 CLINICAL DATA:  66 year old female with chronic respiratory failure, likely pulmonary hypertension. Admitted today for Acute on chronic respiratory failure, possibly acute decompensated heart failure. Initial encounter. EXAM: PORTABLE CHEST 1 VIEW COMPARISON:  Chest radiographs 10/06/2016 and earlier. High-resolution chest CT 10/06/2016. FINDINGS: Portable AP upright view at 1632 hours. Stable cardiomegaly and mediastinal contours. Chronic retrocardiac hypoventilation. Veiling opacity at the right lung base has not significantly changed and there was a moderate size right layering pleural effusion on the 10/06/2016 CT. Associated right perihilar atelectasis. No superimposed pneumothorax. No overt pulmonary edema. IMPRESSION: Suspect stable chest since the chest CT on 10/06/2016; moderate size right pleural effusion and bibasilar atelectasis in the setting of chronic cardiomegaly. Electronically Signed   By: Genevie Ann M.D.   On: 11/04/2016 16:55          Assessment & Plan:   66 year old female with multiple comorbid illnesses presented to the pulmonary clinic on 11/04/2016 with acute respiratory failure with hypoxemia and profound dyspnea in the setting of decompensated congestive heart failure. 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    ASSESSMENT and PLAN  Acute on chronic respiratory failure with hypoxemia (HCC) Worse than baseline. Currently 6L [place] and pulse ox 86%.Sounds Acute diast chf and pulm htn related. No evidence of infection. Not tolerating bipap . ? Inadequate diuresis  Plan o2 for pulse ox > 88% resttart higher dose lasix Try CPAP QHS Cards consult for bubble study and right heart cath   Pulmonary hypertension Needs right heart cath  Chronic atrial fibrillation (HCC) Stable Maintain xarelto and atenolol Dc aspiritin at her request  OSA (obstructive sleep apnea) Try cppap qhs  Pleural effusion, right Small on cxr; llikely due to acute diast chf  Plan Monitor with diuresis  Hypothyroidism synthrouid  Morbid obesity ([place]) The ultimate primary problem  Gout Continue home allopurinol  Acute diastolic heart failure [facility]) Calling cards consult - dr Wynonia Lawman      FAMILY  - Updates: 11/05/2016 --> patinet updated  - Inter-disciplinary family meet or Palliative Care meeting due by:  DAy 7. Current LOS is LOS 1 days  CODE STATUS    Code Status Orders        Start     Ordered   11/04/16 1612  Full code  Continuous     11/04/16 1611    Code Status History    Date Active Date Inactive Code Status Order ID Comments User Context   04/29/2016  1:07 PM 05/02/2016 10:53 PM Full Code [id number]  Netta Cedars, MD Inpatient   07/22/2015  6:50 PM 07/25/2015  3:29 PM Full Code [id number]  Bonnielee Haff, MD Inpatient        DISPO Keep in I3w*     Dr. Brand Males, M.D., F.C.C.P Pulmonary and Critical Care Medicine Staff Physician [facility] Pager: [phone], If no answer or between  15:00h - 7:00h: call [phone]  11/05/2016 2:47 PM

## 2016-11-05 NOTE — Progress Notes (Signed)
RT note:  Patient refuses CPAP.  She states that she was unable to tolerate it last night and does not have any desire to try again tonight.  Patient agreed to use call bell if she changes her mind.

## 2016-11-05 NOTE — Progress Notes (Signed)
MD notified urine output was 100 cc in the first 4 of hours of shift. No change in orders.

## 2016-11-05 NOTE — Assessment & Plan Note (Addendum)
Per cardiology

## 2016-11-05 NOTE — Progress Notes (Signed)
Have tried diff.places to get better o2 sat reading on patient. Ears are too small to keep probe on them. Tried forehead not any better with reading. Tried toes and fingers. . Fingers are sl. Cool and nail beds sl blue. Not getting good  reading. Patient is warm and dry color else where is good. Not in resp distress.. She does get DOE on any activity. She does use her abdo muscles. Not complaining of resp prob.

## 2016-11-05 NOTE — Progress Notes (Signed)
Dr. Lake Bells paged and made aware of Potassium 3.0 and CXR results. See orders. R.N. Aware.

## 2016-11-05 NOTE — Assessment & Plan Note (Signed)
synthrouid

## 2016-11-05 NOTE — Assessment & Plan Note (Signed)
Small on cxr; llikely due to acute diast chf  Plan Monitor with diuresis

## 2016-11-05 NOTE — Progress Notes (Signed)
During rounds Melanie RN wanted a second set of eyes in regards to a better way to pick up O2 sats. Pt's toes and fingers are bluish color and cold, per pt this is her norm, probe would not stay on her ears, placed on forehead by RT sats reading 92-97% on 7L [place], goal per MD is to keep sats greater than 88%. MD paged PTA no new orders given. Pt alert, oriented x4, answers all questions appropriately, sitting up in bed asking for pain medication for chronic back pain. Skin on face and trunk warm and pink. No interventions from this RN. Advised RN to call for any changes.

## 2016-11-05 NOTE — Assessment & Plan Note (Addendum)
Needs right heart cath; cards arranging

## 2016-11-05 NOTE — Assessment & Plan Note (Addendum)
Does not tolerate cpap or bipap

## 2016-11-05 NOTE — Assessment & Plan Note (Addendum)
Worse than baseline. Currently still on 6L [place] and pulse ox 86% and unchanged since admission Sounds Acute diast chf and pulm htn related. No evidence of infection. Not tolerating bipap and cpap Refractory to diuresis  Plan o2 for pulse ox > 88% Try CPAP QHS again Cards consult for bubble study and right heart cath and left cath - appreciated

## 2016-11-05 NOTE — Assessment & Plan Note (Signed)
Continue home allopurinol

## 2016-11-05 NOTE — Assessment & Plan Note (Signed)
The ultimate primary problem
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
## 2016-11-06 ENCOUNTER — Inpatient Hospital Stay (HOSPITAL_COMMUNITY): Payer: PPO

## 2016-11-06 ENCOUNTER — Other Ambulatory Visit (HOSPITAL_COMMUNITY): Payer: PPO

## 2016-11-06 DIAGNOSIS — I700 Atherosclerosis of aorta: Secondary | ICD-10-CM | POA: Diagnosis present

## 2016-11-06 LAB — CBC WITH DIFFERENTIAL/PLATELET
BASOS PCT: 0 %
Basophils Absolute: 0 10*3/uL (ref 0.0–0.1)
Eosinophils Absolute: 0.5 10*3/uL (ref 0.0–0.7)
Eosinophils Relative: 6 %
HEMATOCRIT: 35 % — AB (ref 36.0–46.0)
Hemoglobin: 10.6 g/dL — ABNORMAL LOW (ref 12.0–15.0)
LYMPHS ABS: 0.7 10*3/uL (ref 0.7–4.0)
LYMPHS PCT: 9 %
MCH: 28.7 pg (ref 26.0–34.0)
MCHC: 30.3 g/dL (ref 30.0–36.0)
MCV: 94.9 fL (ref 78.0–100.0)
MONOS PCT: 8 %
Monocytes Absolute: 0.6 10*3/uL (ref 0.1–1.0)
NEUTROS ABS: 6 10*3/uL (ref 1.7–7.7)
NEUTROS PCT: 77 %
Platelets: 200 10*3/uL (ref 150–400)
RBC: 3.69 MIL/uL — ABNORMAL LOW (ref 3.87–5.11)
RDW: 20.4 % — ABNORMAL HIGH (ref 11.5–15.5)
WBC: 7.8 10*3/uL (ref 4.0–10.5)

## 2016-11-06 LAB — BASIC METABOLIC PANEL
Anion gap: 10 (ref 5–15)
BUN: 15 mg/dL (ref 6–20)
CHLORIDE: 90 mmol/L — AB (ref 101–111)
CO2: 37 mmol/L — AB (ref 22–32)
CREATININE: 1.14 mg/dL — AB (ref 0.44–1.00)
Calcium: 9.1 mg/dL (ref 8.9–10.3)
GFR calc non Af Amer: 49 mL/min — ABNORMAL LOW (ref >60)
GFR, EST AFRICAN AMERICAN: 56 mL/min — AB (ref >60)
GLUCOSE: 113 mg/dL — AB (ref 65–99)
Potassium: 3.5 mmol/L (ref 3.5–5.1)
Sodium: 137 mmol/L (ref 135–145)

## 2016-11-06 LAB — HEPARIN LEVEL (UNFRACTIONATED)
Heparin Unfractionated: >2.2 IU/mL — ABNORMAL HIGH (ref 0.30–0.70)
Heparin Unfractionated: >2.2 IU/mL — ABNORMAL HIGH (ref 0.30–0.70)

## 2016-11-06 LAB — PHOSPHORUS: Phosphorus: 4.5 mg/dL (ref 2.5–4.6)

## 2016-11-06 LAB — MAGNESIUM: MAGNESIUM: 2.1 mg/dL (ref 1.7–2.4)

## 2016-11-06 LAB — APTT
aPTT: 52 seconds — ABNORMAL HIGH (ref 24–36)
aPTT: 52 seconds — ABNORMAL HIGH (ref 24–36)

## 2016-11-06 MED ORDER — SODIUM CHLORIDE 0.9 % IV SOLN
INTRAVENOUS | Status: DC
  Administered 2016-11-07: 06:00:00 via INTRAVENOUS

## 2016-11-06 MED ORDER — HEPARIN (PORCINE) IN NACL 100-0.45 UNIT/ML-% IJ SOLN
1550.0000 [IU]/h | INTRAMUSCULAR | Status: DC
  Administered 2016-11-06: 1200 [IU]/h via INTRAVENOUS
  Filled 2016-11-06: qty 250

## 2016-11-06 MED ORDER — ASPIRIN 81 MG PO CHEW
81.0000 mg | CHEWABLE_TABLET | ORAL | Status: AC
  Administered 2016-11-07: 81 mg via ORAL
  Filled 2016-11-06 (×2): qty 1

## 2016-11-06 MED ORDER — POTASSIUM CHLORIDE CRYS ER 20 MEQ PO TBCR
40.0000 meq | EXTENDED_RELEASE_TABLET | Freq: Once | ORAL | Status: AC
  Administered 2016-11-06: 40 meq via ORAL
  Filled 2016-11-06: qty 2

## 2016-11-06 MED ORDER — SODIUM CHLORIDE 0.9% FLUSH: 3.0000 mL | INTRAVENOUS | Status: DC | PRN

## 2016-11-06 MED ORDER — SODIUM CHLORIDE 0.9 % IV SOLN: 250.0000 mL | INTRAVENOUS | Status: DC | PRN

## 2016-11-06 MED ORDER — SODIUM CHLORIDE 0.9% FLUSH
3.0000 mL | Freq: Two times a day (BID) | INTRAVENOUS | Status: DC
  Administered 2016-11-06: 3 mL via INTRAVENOUS

## 2016-11-06 NOTE — Progress Notes (Signed)
Subjective:    Patient ID: [name], female    DOB: 23-Apr-1951, 66 y.o.   MRN: [id number]  Synopsis: Referred in 2017 to the [facility] pulmonary clinic for evaluation of chronic respiratory failure with hypoxemia and likely pulmonary hypertension. She has a past medical history significant for stroke, diastolic heart failure, atrial fibrillation. She also has obstructive sleep apnea and is status post U P3 surgery.  She ambulated approximately 100 feet on 4 L with the Oxymizer and her O2 saturation dropped to 61% and her heart rate dropped to 41 while becoming cyanotic. It took approximately 10 minutes with an O2 flow rate at 6 L nasal cannula through an Oxymizer for her O2 saturation to reach 90% and her heart rate to improve to the 80s to 90s and her color to change to pink again.  baselne testing  Arterial blood gas: 10/06/2016 performed on oxygen: 7.43/53.1/65.0/34.7  Pulmonary function testing: January 2018: FVC 0.88 L, 32% predicted, normal ratio, DLCO 7. 10/13/1934 percent predicted  Imaging: 10/06/2016 high-resolution CT scan of the chest: Images independently reviewed by me today in clinic, there is normal pulmonary parenchyma with the exception of interlobular septal thickening and groundglass worse in the dependent sections of all lobes, there is a moderate size right-sided pleural effusion, pulmonary vascular engorgement. 10/06/2016 VQ scan: No evidence of pulmonary embolism, decreased ventilation and perfusion to the right lower lobe which corresponds to the large effusion on CT  Echocardiogram: December 2017: LVEF 55-60%, cannot assess diastolic function, left atrium moderately dilated, no mitral regurgitation, right atrium severely dilated, right ventricle cavity size moderately dilated, moderate tricuspid regurgitation, pulmonary artery peak systolic pressure estimate 77 mmHg  PCCM admitted   EVENTS 11/04/2016 - admit 11/05/16 - feels very air hungry. On 6L [place]  pulse ox 84-88%. Claustrophobic with bipap last night per RN. On diursis but not making much urine. She is worried abuot add on aspirin to xarelto due to bleeding risk  At hoome only on lsix 37m daily and metalazone. Currently on tab demadex 270mdaily    SUBJECTIVE/OVERNIGHT/INTERVAL HX 3/4 - making dark urine that Iis baseline per patient. Refractory to diuresis. Cards trying to set up cath for her 11/07/16.. Dan Makerdded. XArelto on hold for cath. On Iv heparing gtt /  OBJECTIVE Vitals:   11/05/16 1113 11/05/16 1400 11/05/16 2131 11/06/16 0548  BP:   118/67 130/87  Pulse:   (!) 155 90  Resp:   (!) 21 20  Temp:   97.6 F (36.4 C) 97.6 F (36.4 C)  TempSrc:   Oral Oral  SpO2: (!) 88% (!) 88% 95% 96%  Weight:    129.5 kg (285 lb 6.4 oz)  Height:         General Appearance:    Obese ++  Head:    Normocephalic, without obvious abnormality, atraumatic  Eyes:    PERRL - yes, conjunctiva/corneas - clear      Ears:    Normal external ear canals, both ears  Nose:   NG tube - no  Throat:  ETT TUBE - no , OG tube - no  Neck:   Supple,  No enlargement/tenderness/nodules     Lungs:     Clear to auscultation bilaterally but obese and overall diminished air entry  Chest wall:    No deformity  Heart:    S1 and S2 normal, no murmur, CVP - no.  Pressors - no  Abdomen:     Soft, no masses, no organomegaly  Genitalia:    Not done  Rectal:   not done  Extremities:   Extremities- eddema chronic ++     Skin:   Intact in exposed areas . Sacral area - no reports of decub     Neurologic:   Sedation - none -> RASS - +2 . Moves all 4s - yes. CAM-ICU - neg for delirium.  . Orientation - yes        PULMONARY  Recent Labs Lab 11/04/16 1630  PHART 7.454*  PCO2ART 52.1*  PO2ART 57.7*  HCO3 36.2*  O2SAT 89.2    CBC  Recent Labs Lab 11/04/16 1656 11/05/16 0319 11/06/16 0221  HGB 10.8* 10.4* 10.6*  HCT 35.8* 34.5* 35.0*  WBC 6.1 6.6 7.8  PLT 184 187 200    COAGULATION No  results for input(s): INR in the last 168 hours.  CARDIAC  No results for input(s): TROPONINI in the last 168 hours. No results for input(s): PROBNP in the last 168 hours.   CHEMISTRY  Recent Labs Lab 11/04/16 1656 11/05/16 0319 11/06/16 0221  NA 140 140 137  K 3.0* 3.0* 3.5  CL 90* 91* 90*  CO2 37* 36* 37*  GLUCOSE 93 108* 113*  BUN _0 CREATININE 0.97 1.01* 1.14*  CALCIUM 9.2 9.2 9.1  MG 1.4*  --  2.1  PHOS 4.2  --  4.5   Estimated Creatinine Clearance: 60.9 mL/min (by C-G formula based on SCr of 1.14 mg/dL (H)).   LIVER  Recent Labs Lab 11/04/16 1656  AST 32  ALT 9*  ALKPHOS 140*  BILITOT 2.7*  PROT 6.4*  ALBUMIN 3.3*     INFECTIOUS No results for input(s): LATICACIDVEN, PROCALCITON in the last 168 hours.   ENDOCRINE CBG (last 3)  No results for input(s): GLUCAP in the last 72 hours.       IMAGING x48h  - image(s) personally visualized  -   highlighted in bold Dg Chest Port 1 View  Result Date: 11/04/2016 CLINICAL DATA:  65 year old female with chronic respiratory failure, likely pulmonary hypertension. Admitted today for Acute on chronic respiratory failure, possibly acute decompensated heart failure. Initial encounter. EXAM: PORTABLE CHEST 1 VIEW COMPARISON:  Chest radiographs 10/06/2016 and earlier. High-resolution chest CT 10/06/2016. FINDINGS: Portable AP upright view at 1632 hours. Stable cardiomegaly and mediastinal contours. Chronic retrocardiac hypoventilation. Veiling opacity at the right lung base has not significantly changed and there was a moderate size right layering pleural effusion on the 10/06/2016 CT. Associated right perihilar atelectasis. No superimposed pneumothorax. No overt pulmonary edema. IMPRESSION: Suspect stable chest since the chest CT on 10/06/2016; moderate size right pleural effusion and bibasilar atelectasis in the setting of chronic cardiomegaly. Electronically Signed   By: Genevie Ann M.D.   On: 11/04/2016 16:55           Assessment & Plan:   66 year old female with multiple comorbid illnesses presented to the pulmonary clinic on 11/04/2016 with acute respiratory failure with hypoxemia and profound dyspnea in the setting of decompensated congestive heart failure. 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    ASSESSMENT and PLAN  Acute on chronic respiratory failure with hypoxemia (HCC) Worse than baseline. Currently still on 6L [place] and pulse ox 86% and unchanged since admission Sounds Acute diast chf and pulm htn related. No evidence of infection. Not tolerating bipap and cpap Refractory to diuresis  Plan o2 for pulse ox > 88%  Try CPAP QHS again Cards consult for bubble study and right heart cath and left cath - appreciated   Pulmonary hypertension Needs right heart cath; cards arranging  Chronic atrial fibrillation ([place]) Per cardiology  OSA (obstructive sleep apnea) Does not tolerate cpap or bipap  Pleural effusion, right Small on cxr; llikely due to acute diast chf  Plan Monitor with diuresis  Hypothyroidism synthrouid  Morbid obesity ([place]) The ultimate primary problem  Gout Continue home allopurinol  Acute on chronic diastolic congestive heart failure [facility]) Per cardiology   MEDS  Current Facility-Administered Medications:  .  0.9 %  sodium chloride infusion, 250 mL, Intravenous, PRN, Juanito Doom, MD, Last Rate: 10 mL/hr at 11/05/16 1704, 250 mL at 11/05/16 1704 .  allopurinol (ZYLOPRIM) tablet 200 mg, 200 mg, Oral, Daily, Juanito Doom, MD, 200 mg at 11/06/16 0959 .  atenolol (TENORMIN) tablet 50 mg, 50 mg, Oral, Daily, 50 mg at 11/06/16 0959 **AND** atenolol (TENORMIN) tablet 25 mg, 25 mg, Oral, QHS, Kimberly B Hammons, RPH, 25 mg at 11/05/16 2103 .  furosemide (LASIX) injection 80 mg, 80 mg, Intravenous, Q8H, Brand Males, MD, 80 mg at  11/06/16 0607 .  heparin ADULT infusion 100 units/mL (25000 units/29m sodium chloride 0.45%), 1,200 Units/hr, Intravenous, Continuous, DJuanito Doom MD, Last Rate: 12 mL/hr at 11/06/16 1000, 1,200 Units/hr at 11/06/16 1000 .  levothyroxine (SYNTHROID, LEVOTHROID) tablet 75 mcg, 75 mcg, Oral, QAC breakfast, DJuanito Doom MD, 75 mcg at 11/06/16 0607 .  methocarbamol (ROBAXIN) tablet 250 mg, 250 mg, Oral, Q6H PRN, EColbert Coyer MD, 250 mg at 11/06/16 0115 .  ondansetron (ZOFRAN) injection 4 mg, 4 mg, Intravenous, Q6H PRN, DJuanito Doom MD .  potassium chloride (K-DUR,KLOR-CON) CR tablet 10 mEq, 10 mEq, Oral, Daily, DJuanito Doom MD, 10 mEq at 11/06/16 1000 .  potassium chloride SA (K-DUR,KLOR-CON) CR tablet 40 mEq, 40 mEq, Oral, Once, MBrand Males MD .  torsemide (DEMADEX) tablet 20 mg, 20 mg, Oral, Daily, DJuanito Doom MD, 20 mg at 11/06/16 1000 .  traMADol (ULTRAM) tablet 50 mg, 50 mg, Oral, Q8H PRN, EColbert Coyer MD, 50 mg at 11/06/16 1020      FAMILY  - Updates: 11/06/2016 --> patinet updated  - Inter-disciplinary family meet or Palliative Care meeting due by:  DAy 7. Current LOS is LOS 2 days  CODE STATUS    Code Status Orders        Start     Ordered   11/04/16 1612  Full code  Continuous     11/04/16 1611    Code Status History    Date Active Date Inactive Code Status Order ID Comments User Context   04/29/2016  1:07 PM 05/02/2016 10:53 PM Full Code [id number] SNetta Cedars MD Inpatient   07/22/2015  6:50 PM 07/25/2015  3:29 PM Full Code [id number] GBonnielee Haff MD Inpatient        DISPO Keep in 3w     Dr. MBrand Males M.D., FFranklin County Memorial HospitalC.P Pulmonary and Critical Care Medicine Staff Physician CFrontonPulmonary and Critical Care Pager: [phone] If no answer or between  15:00h - 7:00h: call [phone]  11/06/2016 12:42 PM

## 2016-11-06 NOTE — Progress Notes (Signed)
  Echocardiogram 2D Echocardiogram limited has been performed with bubble study.  Aggie Cosier 11/06/2016, 2:23 PM

## 2016-11-06 NOTE — Progress Notes (Signed)
Subjective:  Slept fair last night, refused CPAP last night.  Complains of some throat irritation and has some mild wheezing today.  Not much change in weight currently despite increased diuresis.  Potassium is higher today and magnesium was given yesterday.  Objective:  Vital Signs in the last 24 hours: BP 130/87 (BP Location: Right Arm)   Pulse 90   Temp 97.6 F (36.4 C) (Oral)   Resp 20   Ht 5' 1" (1.549 m)   Wt 129.5 kg (285 lb 6.4 oz)   LMP 05/06/2013   SpO2 96%   BMI 53.93 kg/m   Physical Exam: Severely obese white female in no acute distress Lungs:  Bilateral wheezing, crackles at bases  Cardiac:  Irregular rhythm, normal S1 and S2, no S3 Abdomen:  Soft, nontender, no masses, quite large Extremities:  1+ edema present, mild changes of chronic venous stasis Psych: Alert and Oriented 3  Intake/Output from previous day: 03/03 0701 - 03/04 0700 In: 1070 [P.O.:960; I.V.:110] Out: 700 [Urine:700] Weight Filed Weights   11/04/16 1629 11/05/16 0204 11/06/16 0548  Weight: 129 kg (284 lb 8 oz) 129 kg (284 lb 6.3 oz) 129.5 kg (285 lb 6.4 oz)    Lab Results: Basic Metabolic Panel:  Recent Labs  11/05/16 0319 11/06/16 0221  NA 140 137  K 3.0* 3.5  CL 91* 90*  CO2 36* 37*  GLUCOSE 108* 113*  BUN 13 15  CREATININE 1.01* 1.14*    CBC:  Recent Labs  11/04/16 1656 11/05/16 0319 11/06/16 0221  WBC 6.1 6.6 7.8  NEUTROABS 4.6  --  6.0  HGB 10.8* 10.4* 10.6*  HCT 35.8* 34.5* 35.0*  MCV 93.7 94.3 94.9  PLT 184 187 200    BNP    Component Value Date/Time   BNP 521.1 (H) 11/04/2016 1656   BNP 348.6 (H) 09/22/2015 0938   Telemetry: Personally reviewed, atrial fibrillation with controlled response  Assessment/Plan:  1.  Acute on chronic diastolic congestive heart failure 2.  Probable obesity hypoventilation syndrome with CO2 retention 3.  Sleep apnea 4.  Severe pulmonary hypertension  Recommendations:  Hold Xarelto tonight in anticipation of possible  right heart cath tomorrow.  May need left heart cath and will discuss with pulmonary.  Echo with bubble study to evaluate for possible shunt.  She is somewhat refractory to diuresis at this point.  May need additional Zaroxolyn.     Kerry Hough  MD [facility] Cardiology  11/06/2016, 7:37 AM

## 2016-11-06 NOTE — Significant Event (Signed)
Rapid Response Event Note  Overview: Time Called: 2046 (Spoke with RN during rounds on floor) Arrival Time: 2046 Event Type: Respiratory  Initial Focused Assessment: RN concerned about patients sats-80s.  Patient alert and oriented sitting on BSC.  Pt doesn't look in distress.  Pt says shes not having a hard time breathing.  Sats-80s without a good waveform.  Lungs clear with few crackles in bases.  Skin warm and dry.  Hands/fingers cold.   Interventions:   Sat probe changed and location changed.  Sats 97% with a better waveform.    Plan of Care (if not transferred): Encouraged RN to use warm pack on hands when getting sat since patients  hands are cold and to call RRT if assistance needed. Event Summary:   at      at    Outcome: Stayed in room and stabalized  Event End Time: 2105  Dillard Essex

## 2016-11-06 NOTE — Progress Notes (Signed)
ANTICOAGULATION CONSULT NOTE - Follow Up Consult  Pharmacy Consult for Heparin Indication: atrial fibrillation  Allergies  Allergen Reactions  . Ambien [Zolpidem Tartrate] Other (See Comments)    Amnesia and fall   . Losartan Other (See Comments)    Elevated creatinine and swelling  . Prevacid [Lansoprazole] Swelling  . Adhesive [Tape] Other (See Comments)    Adhesive tape and band aids " irritate, " causes blisters and pulls skin when removing. Okay to use paper tape  . Keflex [Cephalexin] Swelling    Swelling in ankles, feet  . Motrin [Ibuprofen] Other (See Comments)    ANKLE EDEMA   . Prilosec [Omeprazole] Other (See Comments)    Swelling in ankles.  . Ace Inhibitors Cough  . Benadryl [Diphenhydramine Hcl] Other (See Comments)    topical    Patient Measurements: Height: _0  (154.9 cm) Weight: 285 lb 6.4 oz (129.5 kg) IBW/kg (Calculated) : 47.8 Heparin Dosing Weight:  80.5 kg  Vital Signs: Temp: 97.8 F (36.6 C) (03/04 1511) Temp Source: Oral (03/04 1511) BP: 123/29 (03/04 1511) Pulse Rate: 90 (03/04 1511)  Labs:  Recent Labs  11/04/16 1656 11/05/16 0319 11/06/16 0221 11/06/16 0827 11/06/16 1433  HGB 10.8* 10.4* 10.6*  --   --   HCT 35.8* 34.5* 35.0*  --   --   PLT 184 187 200  --   --   APTT  --   --   --   --  52*  HEPARINUNFRC  --   --   --  >2.20* >2.20*  CREATININE 0.97 1.01* 1.14*  --   --     Estimated Creatinine Clearance: 60.9 mL/min (by C-G formula based on SCr of 1.14 mg/dL (H)).   Assessment:  Anticoag: Afib, PTA xarelto for afib/CVA (last dose 3/3) - now switching to heparin for RHC for pulm HTN. Hep Wt 80.5 kg.  - Hgb low 10.6, plts wnl, no bleeding noted - aPTT 52 low    Goal of Therapy:  Heparin level 0.3-0.7 units/ml  aptt 66-102 Monitor platelets by anticoagulation protocol: Yes   Plan:  Increase IV heparin to 1400 units/hr Recheck aPTT in 6 hrs.   Jenai Scaletta S. Alford Highland, PharmD, BCPS Clinical Staff Pharmacist Pager  [phone]  Eilene Ghazi Stillinger 11/06/2016,3:50 PM

## 2016-11-06 NOTE — Significant Event (Addendum)
Rapid Response Event Note  Overview: RN concerned because patients sats reading 50s.   Time Called: 2254 Arrival Time: 2257 Event Type: Respiratory  Initial Focused Assessment:  Patient alert and oriented sitting up in bed.  No respiratory distress noted.  Pt says she isn't having a hard time breathing.  Sat waveform not good.    Interventions: Patient placed on VM .55 PTA RRT. Sat probe changed and location changed. Sats-92 with a better waveform.  Plan of Care (if not transferred): ABG in 15 minutes to check P02 on VM .55. Call Dr. Lamonte Sakai with ABG results. Call RRT if assistance needed.  Event Summary: Name of Physician Notified: Dr. Lamonte Sakai at 2230    at    Outcome: Stayed in room and stabalized  Event End Time: 2105  Dillard Essex

## 2016-11-06 NOTE — Progress Notes (Signed)
[place] for Heparin Indication: atrial fibrillation  Allergies  Allergen Reactions  . Ambien [Zolpidem Tartrate] Other (See Comments)    Amnesia and fall   . Losartan Other (See Comments)    Elevated creatinine and swelling  . Prevacid [Lansoprazole] Swelling  . Adhesive [Tape] Other (See Comments)    Adhesive tape and band aids " irritate, " causes blisters and pulls skin when removing. Okay to use paper tape  . Keflex [Cephalexin] Swelling    Swelling in ankles, feet  . Motrin [Ibuprofen] Other (See Comments)    ANKLE EDEMA   . Prilosec [Omeprazole] Other (See Comments)    Swelling in ankles.  . Ace Inhibitors Cough  . Benadryl [Diphenhydramine Hcl] Other (See Comments)    topical    Patient Measurements: Height: _0  (154.9 cm) Weight: 285 lb 6.4 oz (129.5 kg) IBW/kg (Calculated) : 47.8 Heparin Dosing Weight:  80.5 kg  Vital Signs: Temp: 97.7 F (36.5 C) (03/04 2001) Temp Source: Oral (03/04 2001) BP: 122/66 (03/04 2001) Pulse Rate: 82 (03/04 2001)  Labs:  Recent Labs  11/04/16 1656 11/05/16 0319 11/06/16 0221 11/06/16 0827 11/06/16 1433 11/06/16 2138  HGB 10.8* 10.4* 10.6*  --   --   --   HCT 35.8* 34.5* 35.0*  --   --   --   PLT 184 187 200  --   --   --   APTT  --   --   --   --  52* 52*  HEPARINUNFRC  --   --   --  >2.20* >2.20* >2.20*  CREATININE 0.97 1.01* 1.14*  --   --   --     Estimated Creatinine Clearance: 60.9 mL/min (by C-G formula based on SCr of 1.14 mg/dL (H)).  Assessment: 66 y.o. female with h/o Afib, Xarelto on hold, for heparin   Goal of Therapy:  Heparin level 0.3-0.7 units/ml  aPTT 66-102 Monitor platelets by anticoagulation protocol: Yes   Plan:  Increase Heparin 1700 units/hr Follow-up am labs.   Caryl Pina 11/06/2016,10:41 PM
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
## 2016-11-07 ENCOUNTER — Encounter (HOSPITAL_COMMUNITY)
Admission: AD | Disposition: A | Discharge status: Home / Self Care | Payer: Self-pay | Source: Ambulatory Visit | Attending: Pulmonary Disease

## 2016-11-07 ENCOUNTER — Ambulatory Visit: Payer: PPO | Admitting: Pulmonary Disease

## 2016-11-07 DIAGNOSIS — I5033 Acute on chronic diastolic (congestive) heart failure: Principal | ICD-10-CM

## 2016-11-07 DIAGNOSIS — I110 Hypertensive heart disease with heart failure: Secondary | ICD-10-CM

## 2016-11-07 DIAGNOSIS — I2510 Atherosclerotic heart disease of native coronary artery without angina pectoris: Secondary | ICD-10-CM

## 2016-11-07 HISTORY — PX: RIGHT/LEFT HEART CATH AND CORONARY ANGIOGRAPHY: CATH118266

## 2016-11-07 LAB — POCT I-STAT 3, VENOUS BLOOD GAS (G3P V)
ACID-BASE EXCESS: 10 mmol/L — AB (ref 0.0–2.0)
Acid-Base Excess: 10 mmol/L — ABNORMAL HIGH (ref 0.0–2.0)
Acid-Base Excess: 11 mmol/L — ABNORMAL HIGH (ref 0.0–2.0)
Acid-Base Excess: 11 mmol/L — ABNORMAL HIGH (ref 0.0–2.0)
Acid-Base Excess: 12 mmol/L — ABNORMAL HIGH (ref 0.0–2.0)
Acid-Base Excess: 9 mmol/L — ABNORMAL HIGH (ref 0.0–2.0)
BICARBONATE: 37.5 mmol/L — AB (ref 20.0–28.0)
BICARBONATE: 37.7 mmol/L — AB (ref 20.0–28.0)
BICARBONATE: 38.6 mmol/L — AB (ref 20.0–28.0)
BICARBONATE: 38.7 mmol/L — AB (ref 20.0–28.0)
BICARBONATE: 39.3 mmol/L — AB (ref 20.0–28.0)
Bicarbonate: 36.5 mmol/L — ABNORMAL HIGH (ref 20.0–28.0)
O2 SAT: 33 %
O2 SAT: 37 %
O2 SAT: 81 %
O2 Saturation: 38 %
O2 Saturation: 39 %
O2 Saturation: 41 %
PCO2 VEN: 61.6 mmHg — AB (ref 44.0–60.0)
PCO2 VEN: 63.4 mmHg — AB (ref 44.0–60.0)
PCO2 VEN: 65.2 mmHg — AB (ref 44.0–60.0)
PCO2 VEN: 66 mmHg — AB (ref 44.0–60.0)
PCO2 VEN: 66.3 mmHg — AB (ref 44.0–60.0)
PH VEN: 7.367 (ref 7.250–7.430)
PH VEN: 7.381 (ref 7.250–7.430)
PH VEN: 7.382 (ref 7.250–7.430)
PO2 VEN: 24 mmHg — AB (ref 32.0–45.0)
PO2 VEN: 24 mmHg — AB (ref 32.0–45.0)
PO2 VEN: 25 mmHg — AB (ref 32.0–45.0)
PO2 VEN: 25 mmHg — AB (ref 32.0–45.0)
PO2 VEN: 48 mmHg — AB (ref 32.0–45.0)
TCO2: 38 mmol/L (ref 0–100)
TCO2: 40 mmol/L (ref 0–100)
TCO2: 41 mmol/L (ref 0–100)
TCO2: 41 mmol/L (ref 0–100)
TCO2: 39 mmol/L (ref 0–100)
TCO2: 41 mmol/L (ref 0–100)
pCO2, Ven: 66.9 mmHg — ABNORMAL HIGH (ref 44.0–60.0)
pH, Ven: 7.37 (ref 7.250–7.430)
pH, Ven: 7.373 (ref 7.250–7.430)
pH, Ven: 7.382 (ref 7.250–7.430)
pO2, Ven: 22 mmHg — CL (ref 32.0–45.0)

## 2016-11-07 LAB — BLOOD GAS, ARTERIAL
ACID-BASE EXCESS: 8.9 mmol/L — AB (ref 0.0–2.0)
ACID-BASE EXCESS: 11.7 mmol/L — AB (ref 0.0–2.0)
BICARBONATE: 34.8 mmol/L — AB (ref 20.0–28.0)
Bicarbonate: 37.4 mmol/L — ABNORMAL HIGH (ref 20.0–28.0)
DRAWN BY: 236041
Drawn by: 29017
FIO2: 0.55
FIO2: 55
O2 SAT: 83.4 %
O2 Saturation: 86.8 %
PCO2 ART: 68.3 mmHg — AB (ref 32.0–48.0)
PH ART: 7.328 — AB (ref 7.350–7.450)
PH ART: 7.369 (ref 7.350–7.450)
PO2 ART: 61.2 mmHg — AB (ref 83.0–108.0)
Patient temperature: 98.6
Patient temperature: 98.7
pCO2 arterial: 66.6 mmHg (ref 32.0–48.0)
pO2, Arterial: 54.5 mmHg — ABNORMAL LOW (ref 83.0–108.0)

## 2016-11-07 LAB — BASIC METABOLIC PANEL
Anion gap: 10 (ref 5–15)
BUN: 17 mg/dL (ref 6–20)
CALCIUM: 9.1 mg/dL (ref 8.9–10.3)
CHLORIDE: 93 mmol/L — AB (ref 101–111)
CO2: 32 mmol/L (ref 22–32)
CREATININE: 1.06 mg/dL — AB (ref 0.44–1.00)
GFR calc non Af Amer: 53 mL/min — ABNORMAL LOW (ref >60)
GLUCOSE: 91 mg/dL (ref 65–99)
Potassium: 3.5 mmol/L (ref 3.5–5.1)
Sodium: 135 mmol/L (ref 135–145)

## 2016-11-07 LAB — HEPARIN LEVEL (UNFRACTIONATED): Heparin Unfractionated: >2.2 IU/mL — ABNORMAL HIGH (ref 0.30–0.70)

## 2016-11-07 LAB — MAGNESIUM: Magnesium: 1.6 mg/dL — ABNORMAL LOW (ref 1.7–2.4)

## 2016-11-07 LAB — CBC WITH DIFFERENTIAL/PLATELET
BASOS ABS: 0 10*3/uL (ref 0.0–0.1)
Basophils Relative: 0 %
Eosinophils Absolute: 0.5 10*3/uL (ref 0.0–0.7)
Eosinophils Relative: 7 %
HEMATOCRIT: 35.6 % — AB (ref 36.0–46.0)
HEMOGLOBIN: 10.8 g/dL — AB (ref 12.0–15.0)
LYMPHS PCT: 9 %
Lymphs Abs: 0.7 10*3/uL (ref 0.7–4.0)
MCH: 28.7 pg (ref 26.0–34.0)
MCHC: 30.3 g/dL (ref 30.0–36.0)
MCV: 94.7 fL (ref 78.0–100.0)
MONO ABS: 0.6 10*3/uL (ref 0.1–1.0)
MONOS PCT: 8 %
NEUTROS ABS: 5.7 10*3/uL (ref 1.7–7.7)
NEUTROS PCT: 76 %
Platelets: 226 10*3/uL (ref 150–400)
RBC: 3.76 MIL/uL — ABNORMAL LOW (ref 3.87–5.11)
RDW: 20.3 % — AB (ref 11.5–15.5)
WBC: 7.6 10*3/uL (ref 4.0–10.5)

## 2016-11-07 LAB — POCT ACTIVATED CLOTTING TIME
Activated Clotting Time: 246 seconds
Activated Clotting Time: 268 seconds
Activated Clotting Time: 208 seconds
Activated Clotting Time: 180 seconds

## 2016-11-07 LAB — APTT

## 2016-11-07 LAB — PROTIME-INR
INR: 2.61
PROTHROMBIN TIME: 28.5 s — AB (ref 11.4–15.2)

## 2016-11-07 LAB — PHOSPHORUS: PHOSPHORUS: 4 mg/dL (ref 2.5–4.6)

## 2016-11-07 LAB — MRSA PCR SCREENING: MRSA BY PCR: NEGATIVE

## 2016-11-07 SURGERY — RIGHT/LEFT HEART CATH AND CORONARY ANGIOGRAPHY
Anesthesia: LOCAL

## 2016-11-07 MED ORDER — IOPAMIDOL (ISOVUE-370) INJECTION 76%
INTRAVENOUS | Status: DC | PRN
  Administered 2016-11-07: 60 mL

## 2016-11-07 MED ORDER — HEPARIN (PORCINE) IN NACL 2-0.9 UNIT/ML-% IJ SOLN
INTRAMUSCULAR | Status: DC | PRN
  Administered 2016-11-07: 16:00:00

## 2016-11-07 MED ORDER — HEPARIN SODIUM (PORCINE) 1000 UNIT/ML IJ SOLN
INTRAMUSCULAR | Status: AC
  Filled 2016-11-07: qty 1

## 2016-11-07 MED ORDER — LIDOCAINE HCL (PF) 1 % IJ SOLN
INTRAMUSCULAR | Status: DC | PRN
  Administered 2016-11-07 (×2): 2 mL
  Administered 2016-11-07: 15 mL

## 2016-11-07 MED ORDER — DEXTROSE 5 % IV SOLN
120.0000 mg | Freq: Two times a day (BID) | INTRAVENOUS | Status: DC
  Administered 2016-11-07 – 2016-11-08 (×2): 120 mg via INTRAVENOUS
  Filled 2016-11-07 (×2): qty 12

## 2016-11-07 MED ORDER — RIVAROXABAN 20 MG PO TABS
20.0000 mg | ORAL_TABLET | Freq: Every day | ORAL | Status: DC
  Administered 2016-11-07 – 2016-11-17 (×11): 20 mg via ORAL
  Filled 2016-11-07 (×11): qty 1

## 2016-11-07 MED ORDER — LEVALBUTEROL HCL 0.63 MG/3ML IN NEBU
0.6300 mg | INHALATION_SOLUTION | Freq: Four times a day (QID) | RESPIRATORY_TRACT | Status: DC
  Administered 2016-11-08 – 2016-11-12 (×15): 0.63 mg via RESPIRATORY_TRACT
  Filled 2016-11-07 (×16): qty 3

## 2016-11-07 MED ORDER — VERAPAMIL HCL 2.5 MG/ML IV SOLN
INTRAVENOUS | Status: AC
  Filled 2016-11-07: qty 2

## 2016-11-07 MED ORDER — SODIUM CHLORIDE 0.9% FLUSH: 3.0000 mL | INTRAVENOUS | Status: DC | PRN

## 2016-11-07 MED ORDER — SODIUM CHLORIDE 0.9 % IV SOLN: 250.0000 mL | INTRAVENOUS | Status: DC | PRN

## 2016-11-07 MED ORDER — SODIUM CHLORIDE 0.9% FLUSH
3.0000 mL | Freq: Two times a day (BID) | INTRAVENOUS | Status: DC
  Administered 2016-11-08 – 2016-11-10 (×3): 3 mL via INTRAVENOUS
  Administered 2016-11-10: 10 mL via INTRAVENOUS
  Administered 2016-11-11: 3 mL via INTRAVENOUS
  Administered 2016-11-11: 10 mL via INTRAVENOUS
  Administered 2016-11-12 – 2016-11-14 (×4): 3 mL via INTRAVENOUS
  Administered 2016-11-14: 10 mL via INTRAVENOUS
  Administered 2016-11-15 – 2016-11-17 (×5): 3 mL via INTRAVENOUS

## 2016-11-07 MED ORDER — METOLAZONE 5 MG PO TABS
5.0000 mg | ORAL_TABLET | Freq: Once | ORAL | Status: AC
  Administered 2016-11-07: 5 mg via ORAL
  Filled 2016-11-07: qty 1

## 2016-11-07 MED ORDER — MAGNESIUM SULFATE 2 GM/50ML IV SOLN
2.0000 g | Freq: Once | INTRAVENOUS | Status: AC
  Administered 2016-11-07: 2 g via INTRAVENOUS
  Filled 2016-11-07 (×2): qty 50

## 2016-11-07 MED ORDER — LEVALBUTEROL HCL 0.63 MG/3ML IN NEBU
0.6300 mg | INHALATION_SOLUTION | Freq: Four times a day (QID) | RESPIRATORY_TRACT | Status: DC | PRN
  Administered 2016-11-07: 0.63 mg via RESPIRATORY_TRACT
  Filled 2016-11-07: qty 3

## 2016-11-07 MED ORDER — HEPARIN SODIUM (PORCINE) 1000 UNIT/ML IJ SOLN
INTRAMUSCULAR | Status: DC | PRN
  Administered 2016-11-07: 6000 [IU] via INTRAVENOUS

## 2016-11-07 MED ORDER — IOPAMIDOL (ISOVUE-370) INJECTION 76%
INTRAVENOUS | Status: AC
  Filled 2016-11-07: qty 100

## 2016-11-07 MED ORDER — METOLAZONE 2.5 MG PO TABS: 2.5000 mg | ORAL_TABLET | Freq: Every day | ORAL | Status: DC

## 2016-11-07 MED ORDER — SODIUM CHLORIDE 0.9 % IV SOLN: INTRAVENOUS | Status: AC

## 2016-11-07 MED ORDER — HEPARIN (PORCINE) IN NACL 2-0.9 UNIT/ML-% IJ SOLN
INTRAMUSCULAR | Status: DC | PRN
  Administered 2016-11-07: 10 mL via INTRA_ARTERIAL

## 2016-11-07 MED ORDER — POTASSIUM CHLORIDE CRYS ER 20 MEQ PO TBCR
20.0000 meq | EXTENDED_RELEASE_TABLET | Freq: Every day | ORAL | Status: DC
  Administered 2016-11-07: 20 meq via ORAL
  Filled 2016-11-07: qty 1

## 2016-11-07 MED ORDER — HEPARIN (PORCINE) IN NACL 2-0.9 UNIT/ML-% IJ SOLN
INTRAMUSCULAR | Status: AC
  Filled 2016-11-07: qty 1000

## 2016-11-07 MED ORDER — LIDOCAINE HCL (PF) 1 % IJ SOLN
INTRAMUSCULAR | Status: AC
  Filled 2016-11-07: qty 30

## 2016-11-07 SURGICAL SUPPLY — 19 items
CATH BALLN WEDGE 5F 110CM (CATHETERS) ×2 IMPLANT
CATH OPTITORQUE TIG 4.0 5F (CATHETERS) ×2 IMPLANT
CATH SWAN GANZ 7F STRAIGHT (CATHETERS) ×2 IMPLANT
COVER PRB 48X5XTLSCP FOLD TPE (BAG) ×1 IMPLANT
COVER PROBE 5X48 (BAG) ×1
DEVICE RAD COMP TR BAND LRG (VASCULAR PRODUCTS) ×2 IMPLANT
DEVICE TORQUE H2O (MISCELLANEOUS) ×2 IMPLANT
GLIDESHEATH SLEND A-KIT 6F 22G (SHEATH) ×2 IMPLANT
GUIDEWIRE .025 260CM (WIRE) ×2 IMPLANT
GUIDEWIRE INQWIRE 1.5J.035X260 (WIRE) ×1 IMPLANT
HOVERMATT SINGLE USE (MISCELLANEOUS) ×2 IMPLANT
INQWIRE 1.5J .035X260CM (WIRE) ×2
KIT HEART LEFT (KITS) ×2 IMPLANT
PACK CARDIAC CATHETERIZATION (CUSTOM PROCEDURE TRAY) ×2 IMPLANT
SHEATH PINNACLE 7F 10CM (SHEATH) ×2 IMPLANT
TRANSDUCER W/STOPCOCK (MISCELLANEOUS) ×2 IMPLANT
TUBING CIL FLEX 10 FLL-RA (TUBING) ×2 IMPLANT
WIRE EMERALD 3MM-J .025X260CM (WIRE) ×2 IMPLANT
WIRE LUGE 182CM (WIRE) ×2 IMPLANT

## 2016-11-07 NOTE — Progress Notes (Signed)
Pt transferred from cath lab with TR-band dry and intact and right femoral dressing  Soaked with blood. Cardiac cath staff aware. Will continue to monitor pt. No active bleeding noted.

## 2016-11-07 NOTE — H&P (View-Only) (Signed)
Patient Name: [name] Date of Encounter: 11/07/2016  Primary Cardiologist: Dr. Lance Coon Problem List     Principal Problem:   Acute on chronic respiratory failure with hypoxemia [facility]) Active Problems:   Morbid obesity (HCC)   Chronic atrial fibrillation (HCC)   OSA (obstructive sleep apnea)   Chronic anticoagulation   Shortness of breath   Hypertensive heart disease   Pulmonary hypertension   Hypoxemia   Pleural effusion, right   Acute on chronic diastolic congestive heart failure (HCC)   Obesity hypoventilation syndrome (HCC)   CAD (coronary artery disease), native coronary artery   Aortic atherosclerosis (HCC)     Subjective   Still SOB.  Getting cath today.   Inpatient Medications    Scheduled Meds: . allopurinol  200 mg Oral Daily  . atenolol  50 mg Oral Daily   And  . atenolol  25 mg Oral QHS  . furosemide  80 mg Intravenous Q8H  . levothyroxine  75 mcg Oral QAC breakfast  . potassium chloride  10 mEq Oral Daily  . sodium chloride flush  3 mL Intravenous Q12H  . torsemide  20 mg Oral Daily   Continuous Infusions: . sodium chloride 10 mL/hr at 11/07/16 0617  . heparin 1,700 Units/hr (11/06/16 2245)   PRN Meds: sodium chloride, sodium chloride, methocarbamol, ondansetron (ZOFRAN) IV, sodium chloride flush, traMADol   Vital Signs    Vitals:   11/06/16 1511 11/06/16 2001 11/06/16 2325 11/07/16 0413  BP: (!) 123/29 122/66  (!) 122/103  Pulse: 90 82  94  Resp: _0 Temp: 97.8 F (36.6 C) 97.7 F (36.5 C)  97 F (36.1 C)  TempSrc: Oral Oral  Oral  SpO2: 90%  94%   Weight:    281 lb 15.5 oz (127.9 kg)  Height:        Intake/Output Summary (Last 24 hours) at 11/07/16 0840 Last data filed at 11/06/16 1802  Gross per 24 hour  Intake              170 ml  Output             1000 ml  Net             -830 ml   Filed Weights   11/05/16 0204 11/06/16 0548 11/07/16 0413  Weight: 284 lb 6.3 oz (129 kg) 285 lb 6.4 oz (129.5  kg) 281 lb 15.5 oz (127.9 kg)    Physical Exam    GEN: Well nourished, well developed, in no acute distress.  HEENT: Grossly normal.  Neck: Supple, no JVD, carotid bruits, or masses. Cardiac: Irregularly irregular, no murmurs, rubs, or gallops. No clubbing, cyanosis, edema.  Radials/DP/PT 2+ and equal bilaterally.  Respiratory:  Respirations regular and unlabored, clear to auscultation bilaterally. GI: Soft, nontender, nondistended, BS + x 4. MS: no deformity or atrophy. Skin: warm and dry, no rash. Neuro:  Strength and sensation are intact. Psych: AAOx3.  Normal affect.  Labs    CBC  Recent Labs  11/06/16 0221 11/07/16 0717  WBC 7.8 7.6  NEUTROABS 6.0 5.7  HGB 10.6* 10.8*  HCT 35.0* 35.6*  MCV 94.9 94.7  PLT 200 761   Basic Metabolic Panel  Recent Labs  11/06/16 0221 11/07/16 0717  NA 137 135  K 3.5 3.5  CL 90* 93*  CO2 37* 32  GLUCOSE 113* 91  BUN 15 17  CREATININE 1.14* 1.06*  CALCIUM 9.1 9.1  MG 2.1  1.6*  PHOS 4.5 4.0   Liver Function Tests  Recent Labs  11/04/16 1656  AST 32  ALT 9*  ALKPHOS 140*  BILITOT 2.7*  PROT 6.4*  ALBUMIN 3.3*   No results for input(s): LIPASE, AMYLASE in the last 72 hours. Cardiac Enzymes No results for input(s): CKTOTAL, CKMB, CKMBINDEX, TROPONINI in the last 72 hours. BNP Invalid input(s): POCBNP D-Dimer No results for input(s): DDIMER in the last 72 hours. Hemoglobin A1C No results for input(s): HGBA1C in the last 72 hours. Fasting Lipid Panel No results for input(s): CHOL, HDL, LDLCALC, TRIG, CHOLHDL, LDLDIRECT in the last 72 hours. Thyroid Function Tests  Recent Labs  11/04/16 1656  TSH 6.286*    Telemetry    NSR - Personally Reviewed  ECG    NSR - Personally Reviewed  Radiology    Dg Chest Port 1 View  Result Date: 11/07/2016 CLINICAL DATA:  Hypoxemia EXAM: PORTABLE CHEST 1 VIEW COMPARISON:  11/04/2016 CXR, chest CT 10/06/2016 FINDINGS: Low lung volumes with crowding of interstitial lung  markings. Bilateral pleural effusions right greater than left. Mild vascular congestion is noted though decreased since prior exam. Bilateral shoulder arthroplasties are present. No acute osseous abnormality. IMPRESSION: Cardiomegaly with bilateral pleural effusions right slightly greater than left. Mild vascular congestion. Electronically Signed   By: Ashley Royalty M.D.   On: 11/07/2016 00:10    Cardiac Studies   2D echo Study Conclusions  - Left ventricle: The cavity size was normal. Systolic function was   normal. The estimated ejection fraction was in the range of 55%   to 60%. Although no diagnostic regional wall motion abnormality   was identified, this possibility cannot be completely excluded on   the basis of this study. - Ventricular septum: The contour showed moderate systolic   flattening. These changes are consistent with RV pressure   overload. - Left atrium: The atrium was severely dilated. - Right ventricle: The cavity size was dilated. Systolic function   was moderately to severely reduced. - Right atrium: The atrium was severely dilated. - Atrial septum: The septum bowed from right to left, consistent   with increased right atrial pressure. A shunt cannot be excluded. - Tricuspid valve: There was moderate regurgitation. - Pulmonary arteries: Systolic pressure was severely increased. PA   peak pressure: 90 mm Hg (S).   Patient Profile      66 year old female with history of severe morbid obesity and has chronic atrial fibrillation managed with anticoagulation and rate control.  She has had continued dyspnea and began to have edema that was treated initially with Lasix and later with Zaroxolyn.  She developed hypoxemia and worsening shortness of breath in October.   She now has significant orthopnea and cannot sleep well because of anxiety and also because of breathing difficulty.  She has been on her resting oxygen and has had significant hypoxemia and desaturated  significantly at the pulmonologists office along with  significant bradycardia and was admitted to [facility].  She was noted to have 3 vessel coronary calcifications on CT.  Cardiology was consulted for further evaluation.  She does not have any anginal symptoms.  She denies syncope.  Pulmonary wished for patient to have right heart cath to rule out shunting given significant hypoxemia and pulmonary HTN.  Assessment & Plan    1.  Severe pulmonary hypertension likely due to Group 3 from obesity hypoventilation syndrome, sleep apnea and possible lung disease.  There is some concern, given her significant hypoxemia of right  to left shunting that could be contributing.  She also could have a component of Group 2 PHTN due to significant elevation of her left heart pressures from diastolic CHF that could be contributing to her right sided heart failure as well.    2.  Chronic atrial fibrillation currently rate controlled.  Her Xarelto is on hold for cath.  She is currently on IV Heparin gtt.    3.  Acute on chronic diastolic congestive heart failure.  Cxray showed bilateral pleural effusions R>L with mild vascular congestion.  BNP is elevated at 521.  He is net neg 930cc since admit and 1L out yesterday.  Will continue on IV Lasix.  Renal function is stable.  Will assess LVEDP at time of cath.  4.  Severe morbid obesity  5.  Hypertensive heart disease with severe LVH.  BP controlled.  Continue BB  6.  Obstructive sleep apnea - s/p UP3 surgery.  7.  Coronary artery disease as manifested by coronary calcifications in all 3 vessels on CT scan.  Will do left heart cath at the time of left heart cath.  Cardiac catheterization was discussed with the patient fully. The patient understands that risks include but are not limited to stroke (1 in 1000), death (1 in 10), kidney failure [usually temporary] (1 in 500), bleeding (1 in 200), allergic reaction [possibly serious] (1 in 200).  The patient understands and is  willing to proceed.    I have spent a total of 35 minutes with patient reviewing echo, hospital notes , telemetry, EKGs, labs and examining patient as well as establishing an assessment and plan that was discussed with the patient.  > 50% of time was spent in direct patient care.     Signed, Fransico Him, MD  11/07/2016, 8:40 AM

## 2016-11-07 NOTE — Progress Notes (Signed)
Pt has refused [place] eof cpap at this time because she states she is unable to tolerate the machine.  RT explained the machine would be available if decided to change her mind.  RT will monitor.

## 2016-11-07 NOTE — Progress Notes (Signed)
Subjective:    Patient ID: [name], female    DOB: 08-20-1951, 66 y.o.   MRN: [id number]  Synopsis: Referred in 2017 to the [place] pulmonary clinic for evaluation of chronic respiratory failure with hypoxemia and likely pulmonary hypertension. She has a past medical history significant for stroke, diastolic heart failure, atrial fibrillation. She also has obstructive sleep apnea and is status post U P3 surgery.  She ambulated approximately 100 feet on 4 L with the Oxymizer and her O2 saturation dropped to 61% and her heart rate dropped to 41 while becoming cyanotic. It took approximately 10 minutes with an O2 flow rate at 6 L nasal cannula through an Oxymizer for her O2 saturation to reach 90% and her heart rate to improve to the 80s to 90s and her color to change to pink again.   SUBJECTIVE/OVERNIGHT/INTERVAL HX:  Desaturations overnight > placed on 55% venti mask which she did not like.  [place] planned for today 03/05.   OBJECTIVE BP (!) 122/103 (BP Location: Right Arm)   Pulse 94   Temp 97 F (36.1 C) (Oral)   Resp 20   Ht 5' 1" (1.549 m)   Wt 127.9 kg (281 lb 15.5 oz)   LMP 05/06/2013   SpO2 94%   BMI 53.28 kg/m   Physical Exam: General: Obese female, sitting up in bed, in NAD. Neuro: A&O x 3, no deficits. HEENT: [place] / AT. MM moist. Cardiovascular:  IRIR, no M/R/G. Lungs: Clear bilaterally. Abdomen: Obese, BS hypoactive.  Soft/NT/ND. Musculoskeletal: No deformities, edema difficult to appreciate given body habitus. Skin: Warm, dry.   PULMONARY  Recent Labs Lab 11/04/16 1630 11/06/16 2345  PHART 7.454* 7.369  PCO2ART 52.1* 66.6*  PO2ART 57.7* 54.5*  HCO3 36.2* 37.4*  O2SAT 89.2 83.4    CBC  Recent Labs Lab 11/04/16 1656 11/05/16 0319 11/06/16 0221  HGB 10.8* 10.4* 10.6*  HCT 35.8* 34.5* 35.0*  WBC 6.1 6.6 7.8  PLT 184 187 200    COAGULATION  Recent Labs Lab 11/07/16 0046  INR 2.61    CARDIAC  No results for input(s): TROPONINI in  the last 168 hours. No results for input(s): PROBNP in the last 168 hours.   CHEMISTRY  Recent Labs Lab 11/04/16 1656 11/05/16 0319 11/06/16 0221  NA 140 140 137  K 3.0* 3.0* 3.5  CL 90* 91* 90*  CO2 37* 36* 37*  GLUCOSE 93 108* 113*  BUN _0 CREATININE 0.97 1.01* 1.14*  CALCIUM 9.2 9.2 9.1  MG 1.4*  --  2.1  PHOS 4.2  --  4.5   Estimated Creatinine Clearance: 60.3 mL/min (by C-G formula based on SCr of 1.14 mg/dL (H)).   LIVER  Recent Labs Lab 11/04/16 1656 11/07/16 0046  AST 32  --   ALT 9*  --   ALKPHOS 140*  --   BILITOT 2.7*  --   PROT 6.4*  --   ALBUMIN 3.3*  --   INR  --  2.61     INFECTIOUS No results for input(s): LATICACIDVEN, PROCALCITON in the last 168 hours.   ENDOCRINE CBG (last 3)  No results for input(s): GLUCAP in the last 72 hours.   IMAGING x48h  - image(s) personally visualized  -   highlighted in bold Dg Chest Port 1 View  Result Date: 11/07/2016 CLINICAL DATA:  Hypoxemia EXAM: PORTABLE CHEST 1 VIEW COMPARISON:  11/04/2016 CXR, chest CT 10/06/2016 FINDINGS: Low lung volumes with crowding of interstitial lung markings.  Bilateral pleural effusions right greater than left. Mild vascular congestion is noted though decreased since prior exam. Bilateral shoulder arthroplasties are present. No acute osseous abnormality. IMPRESSION: Cardiomegaly with bilateral pleural effusions right slightly greater than left. Mild vascular congestion. Electronically Signed   By: Ashley Royalty M.D.   On: 11/07/2016 00:10     Baseline Testing: ABG 10/06/2016 performed on oxygen > 7.43/53.1/65.0/34.7 PFT's Jan 2018 > FVC 0.88 L, 32% predicted, normal ratio, DLCO 7.18 (35% pred). High Res CT chest 10/06/16 > normal pulmonary parenchyma with the exception of interlobular septal thickening and groundglass worse in the dependent sections of all lobes, there is a moderate size right-sided pleural effusion, pulmonary vascular engorgement. VQ 10/06/16 > No evidence  of pulmonary embolism, decreased ventilation and perfusion to the right lower lobe which corresponds to the large effusion on CT Echo Dec 2017 > LVEF 55-60%, PAP 77 Echo 11/06/16 > EF 55-60%, PAP 90 RHC 03/05 >   EVENTS 11/04/2016 - admit 11/05/16 - feels very air hungry. On 6L [place] pulse ox 84-88%. Claustrophobic with bipap last night per RN. On diursis but not making much urine. She is worried abuot add on aspirin to xarelto due to bleeding risk      Assessment & Plan:   66 year old female with multiple comorbid illnesses presented to the pulmonary clinic on 11/04/2016 with acute respiratory failure with hypoxemia and profound dyspnea in the setting of decompensated congestive heart failure. 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    ASSESSMENT and PLAN  Acute on chronic hypoxic respiratory failure - multifactorial due to OSA, hypoventilation from restrictive lung disease due to obesity, and also now possible component of slight R to L shunt (echo 03/04 with possible mild shunt but difficult to assess due to body habitus). Pulmonary Hypertension - likely multifactorial group 2 (dCHF and possible mild R to L shunt) group 3 (restrictive lung disease due to obesity). OSA / OHS. Bilateral pleural effusions, R > L. Plan: Continue supplemental O2 as needed to maintain SpO2 > 92%. CPAP QHS PRN. Continue diuresis - metolazone added 03/05 (low dose - will readdress after RHC). Mobilize as able. F/u on RHC results.  Chronic A.fib (on xarelto). Acute on chronic dCHF. Plan: Cardiology following / managing, appreciate the assistance.  Planning for [place] today 03/05.  Hypothyroidism. Plan: Continue preadmission synthroid.   Best Practice: Code Status:  Full Diet:  NPO in anticipation of RHC. Prophylaxis: SCD's / Heparin.   FAMILY  - Updates: No family available 03/05.  -  Inter-disciplinary family meet or Palliative Care meeting due by:  Day 7. Current LOS is LOS 3 days   Montey Hora, [place] [facility] Pager: [phone]  or [phone] 11/07/2016, 8:44 AM

## 2016-11-07 NOTE — Progress Notes (Signed)
eLink Physician-Brief Progress Note Patient Name: [name] DOB: Mar 14, 1951 MRN: [id number]   Date of Service  11/07/2016  HPI/Events of Note  abg Hypoxia May need more lasix  eICU Interventions       Intervention Category Intermediate Interventions: Diagnostic test evaluation  Raylene Miyamoto. 11/07/2016, 9:53 PM

## 2016-11-07 NOTE — Progress Notes (Signed)
Have obtained credible O2 sat wave form with probe on pt's left hand. Sats running in mid 80's Dr Ellyn Hack has been present and is aware.

## 2016-11-07 NOTE — Progress Notes (Addendum)
Patient Name: [name] Date of Encounter: 11/07/2016  Primary Cardiologist: Dr. Lance Coon Problem List     Principal Problem:   Acute on chronic respiratory failure with hypoxemia [facility]) Active Problems:   Morbid obesity (HCC)   Chronic atrial fibrillation (HCC)   OSA (obstructive sleep apnea)   Chronic anticoagulation   Shortness of breath   Hypertensive heart disease   Pulmonary hypertension   Hypoxemia   Pleural effusion, right   Acute on chronic diastolic congestive heart failure (HCC)   Obesity hypoventilation syndrome (HCC)   CAD (coronary artery disease), native coronary artery   Aortic atherosclerosis (HCC)     Subjective   Still SOB.  Getting cath today.   Inpatient Medications    Scheduled Meds: . allopurinol  200 mg Oral Daily  . atenolol  50 mg Oral Daily   And  . atenolol  25 mg Oral QHS  . furosemide  80 mg Intravenous Q8H  . levothyroxine  75 mcg Oral QAC breakfast  . potassium chloride  10 mEq Oral Daily  . sodium chloride flush  3 mL Intravenous Q12H  . torsemide  20 mg Oral Daily   Continuous Infusions: . sodium chloride 10 mL/hr at 11/07/16 0617  . heparin 1,700 Units/hr (11/06/16 2245)   PRN Meds: sodium chloride, sodium chloride, methocarbamol, ondansetron (ZOFRAN) IV, sodium chloride flush, traMADol   Vital Signs    Vitals:   11/06/16 1511 11/06/16 2001 11/06/16 2325 11/07/16 0413  BP: (!) 123/29 122/66  (!) 122/103  Pulse: 90 82  94  Resp: _0 Temp: 97.8 F (36.6 C) 97.7 F (36.5 C)  97 F (36.1 C)  TempSrc: Oral Oral  Oral  SpO2: 90%  94%   Weight:    281 lb 15.5 oz (127.9 kg)  Height:        Intake/Output Summary (Last 24 hours) at 11/07/16 0840 Last data filed at 11/06/16 1802  Gross per 24 hour  Intake              170 ml  Output             1000 ml  Net             -830 ml   Filed Weights   11/05/16 0204 11/06/16 0548 11/07/16 0413  Weight: 284 lb 6.3 oz (129 kg) 285 lb 6.4 oz (129.5  kg) 281 lb 15.5 oz (127.9 kg)    Physical Exam    GEN: Well nourished, well developed, in no acute distress.  HEENT: Grossly normal.  Neck: Supple, no JVD, carotid bruits, or masses. Cardiac: Irregularly irregular, no murmurs, rubs, or gallops. No clubbing, cyanosis, edema.  Radials/DP/PT 2+ and equal bilaterally.  Respiratory:  Respirations regular and unlabored, clear to auscultation bilaterally. GI: Soft, nontender, nondistended, BS + x 4. MS: no deformity or atrophy. Skin: warm and dry, no rash. Neuro:  Strength and sensation are intact. Psych: AAOx3.  Normal affect.  Labs    CBC  Recent Labs  11/06/16 0221 11/07/16 0717  WBC 7.8 7.6  NEUTROABS 6.0 5.7  HGB 10.6* 10.8*  HCT 35.0* 35.6*  MCV 94.9 94.7  PLT 200 761   Basic Metabolic Panel  Recent Labs  11/06/16 0221 11/07/16 0717  NA 137 135  K 3.5 3.5  CL 90* 93*  CO2 37* 32  GLUCOSE 113* 91  BUN 15 17  CREATININE 1.14* 1.06*  CALCIUM 9.1 9.1  MG 2.1  1.6*  PHOS 4.5 4.0   Liver Function Tests  Recent Labs  11/04/16 1656  AST 32  ALT 9*  ALKPHOS 140*  BILITOT 2.7*  PROT 6.4*  ALBUMIN 3.3*   No results for input(s): LIPASE, AMYLASE in the last 72 hours. Cardiac Enzymes No results for input(s): CKTOTAL, CKMB, CKMBINDEX, TROPONINI in the last 72 hours. BNP Invalid input(s): POCBNP D-Dimer No results for input(s): DDIMER in the last 72 hours. Hemoglobin A1C No results for input(s): HGBA1C in the last 72 hours. Fasting Lipid Panel No results for input(s): CHOL, HDL, LDLCALC, TRIG, CHOLHDL, LDLDIRECT in the last 72 hours. Thyroid Function Tests  Recent Labs  11/04/16 1656  TSH 6.286*    Telemetry    NSR - Personally Reviewed  ECG    NSR - Personally Reviewed  Radiology    Dg Chest Port 1 View  Result Date: 11/07/2016 CLINICAL DATA:  Hypoxemia EXAM: PORTABLE CHEST 1 VIEW COMPARISON:  11/04/2016 CXR, chest CT 10/06/2016 FINDINGS: Low lung volumes with crowding of interstitial lung  markings. Bilateral pleural effusions right greater than left. Mild vascular congestion is noted though decreased since prior exam. Bilateral shoulder arthroplasties are present. No acute osseous abnormality. IMPRESSION: Cardiomegaly with bilateral pleural effusions right slightly greater than left. Mild vascular congestion. Electronically Signed   By: Ashley Royalty M.D.   On: 11/07/2016 00:10    Cardiac Studies   2D echo Study Conclusions  - Left ventricle: The cavity size was normal. Systolic function was   normal. The estimated ejection fraction was in the range of 55%   to 60%. Although no diagnostic regional wall motion abnormality   was identified, this possibility cannot be completely excluded on   the basis of this study. - Ventricular septum: The contour showed moderate systolic   flattening. These changes are consistent with RV pressure   overload. - Left atrium: The atrium was severely dilated. - Right ventricle: The cavity size was dilated. Systolic function   was moderately to severely reduced. - Right atrium: The atrium was severely dilated. - Atrial septum: The septum bowed from right to left, consistent   with increased right atrial pressure. A shunt cannot be excluded. - Tricuspid valve: There was moderate regurgitation. - Pulmonary arteries: Systolic pressure was severely increased. PA   peak pressure: 90 mm Hg (S).   Patient Profile      66 year old female with history of severe morbid obesity and has chronic atrial fibrillation managed with anticoagulation and rate control.  She has had continued dyspnea and began to have edema that was treated initially with Lasix and later with Zaroxolyn.  She developed hypoxemia and worsening shortness of breath in October.   She now has significant orthopnea and cannot sleep well because of anxiety and also because of breathing difficulty.  She has been on her resting oxygen and has had significant hypoxemia and desaturated  significantly at the pulmonologists office along with  significant bradycardia and was admitted to [facility].  She was noted to have 3 vessel coronary calcifications on CT.  Cardiology was consulted for further evaluation.  She does not have any anginal symptoms.  She denies syncope.  Pulmonary wished for patient to have right heart cath to rule out shunting given significant hypoxemia and pulmonary HTN.  Assessment & Plan    1.  Severe pulmonary hypertension likely due to Group 3 from obesity hypoventilation syndrome, sleep apnea and possible lung disease.  There is some concern, given her significant hypoxemia of right  to left shunting that could be contributing.  She also could have a component of Group 2 PHTN due to significant elevation of her left heart pressures from diastolic CHF that could be contributing to her right sided heart failure as well.    2.  Chronic atrial fibrillation currently rate controlled.  Her Xarelto is on hold for cath.  She is currently on IV Heparin gtt.    3.  Acute on chronic diastolic congestive heart failure.  Cxray showed bilateral pleural effusions R>L with mild vascular congestion.  BNP is elevated at 521.  He is net neg 930cc since admit and 1L out yesterday.  Will continue on IV Lasix.  Renal function is stable.  Will assess LVEDP at time of cath.  4.  Severe morbid obesity  5.  Hypertensive heart disease with severe LVH.  BP controlled.  Continue BB  6.  Obstructive sleep apnea - s/p UP3 surgery.  7.  Coronary artery disease as manifested by coronary calcifications in all 3 vessels on CT scan.  Will do left heart cath at the time of left heart cath.  Cardiac catheterization was discussed with the patient fully. The patient understands that risks include but are not limited to stroke (1 in 1000), death (1 in 10), kidney failure [usually temporary] (1 in 500), bleeding (1 in 200), allergic reaction [possibly serious] (1 in 200).  The patient understands and is  willing to proceed.    I have spent a total of 35 minutes with patient reviewing echo, hospital notes , telemetry, EKGs, labs and examining patient as well as establishing an assessment and plan that was discussed with the patient.  > 50% of time was spent in direct patient care.     Signed, Fransico Him, MD  11/07/2016, 8:40 AM

## 2016-11-07 NOTE — Progress Notes (Signed)
Dr. Lamonte Sakai notified of patient's ABG results.  Orders to keep patient on VM .55.  Bedside RN aware of plan.

## 2016-11-07 NOTE — Interval H&P Note (Signed)
History and Physical Interval Note:  11/07/2016 1:32 PM  [name]  has presented today for surgery, with the diagnosis of pulmonary HTN and coronary artery calcification.  The various methods of treatment have been discussed with the patient and family. After consideration of risks, benefits and other options for treatment, the patient has consented to  Procedure(s): Right/Left Heart Cath and Coronary Angiography (N/A) as a surgical intervention .  The patient's history has been reviewed, patient examined, no change in status, stable for surgery.  I have reviewed the patient's chart and labs.  Questions were answered to the patient's satisfaction.     [name]

## 2016-11-07 NOTE — Telephone Encounter (Signed)
Attempted to call pt again with no answer. Replied to her MyChart message with information. Told her to return my call if she had any questions.

## 2016-11-07 NOTE — Progress Notes (Signed)
Site area: RFV-RBV Site Prior to Removal:  Level 0-0 Pressure Applied For:30mn-15min Manual:   yes Patient Status During Pull:  stable Post Pull Site:  Level 0 Post Pull Instructions Given:  yes Post Pull Pulses Present: palpale Dressing Applied:  tegaderm---tegaderm and coban Bedrest begins @ 12263till 2230 Comments:

## 2016-11-07 NOTE — Telephone Encounter (Signed)
LM for pt call back.

## 2016-11-07 NOTE — Progress Notes (Signed)
eLink Physician-Brief Progress Note Patient Name: [name] DOB: Jan 10, 1951 MRN: [id number]   Date of Service  11/07/2016  HPI/Events of Note  Hypoxia post cath Severe pa htn  eICU Interventions  abg reviewed Increase O2 Repeat higher lasix     Intervention Category Major Interventions: Hypoxemia - evaluation and management  Raylene Miyamoto. 11/07/2016, 10:32 PM

## 2016-11-07 NOTE — Progress Notes (Addendum)
Discussed cath results with Dr. Ellyn Hack.  No CAD and normal LVF.  Severe pulmonary HTN with PASP 46mHg.  Will transfer to stepdown and give another does of Metalazone 513mIV with next dose of Lasix.  Advanced HF will see in am.

## 2016-11-07 NOTE — Telephone Encounter (Signed)
Spoke with [facility] who stated pt had appt with Dr. Lake Bells scheduled for today at 2:30 pm, but she cancelled the appt. Dr. Anastasia Pall next available appt is April 10th. Scheduled her appt for 9:30 am and will call pt to inform her.

## 2016-11-07 NOTE — Progress Notes (Signed)
eLink Physician-Brief Progress Note Patient Name: [name] DOB: 20-Jun-1951 MRN: [id number]   Date of Service  11/07/2016  HPI/Events of Note  UNreliable SpO2 readings - poor waveform wherever checked. ABG performed on 0.55 Venti mask. PO2 = 53.    eICU Interventions  Continue Venti mask  CXR ordered and pending.      Intervention Category Major Interventions: Respiratory failure - evaluation and management  Kianah Harries S. 11/07/2016, 12:02 AM

## 2016-11-07 NOTE — Progress Notes (Signed)
ANTICOAGULATION CONSULT NOTE - Follow Up Consult  Pharmacy Consult for Heparin (Xarelto on hold) Indication: atrial fibrillation  Allergies  Allergen Reactions  . Ambien [Zolpidem Tartrate] Other (See Comments)    Amnesia and fall   . Losartan Other (See Comments)    Elevated creatinine and swelling  . Prevacid [Lansoprazole] Swelling  . Adhesive [Tape] Other (See Comments)    Adhesive tape and band aids " irritate, " causes blisters and pulls skin when removing. Okay to use paper tape  . Keflex [Cephalexin] Swelling    Swelling in ankles, feet  . Motrin [Ibuprofen] Other (See Comments)    ANKLE EDEMA   . Prilosec [Omeprazole] Other (See Comments)    Swelling in ankles.  . Ace Inhibitors Cough  . Benadryl [Diphenhydramine Hcl] Other (See Comments)    topical    Patient Measurements: Height: _0  (154.9 cm) Weight: 281 lb 15.5 oz (127.9 kg) IBW/kg (Calculated) : 47.8 Heparin Dosing Weight: 82 kg  Vital Signs: Temp: 97 F (36.1 C) (03/05 0413) Temp Source: Oral (03/05 0413) BP: 122/103 (03/05 0413) Pulse Rate: 94 (03/05 0413)  Labs:  Recent Labs  11/05/16 0319 11/06/16 0221  11/06/16 1433 11/06/16 2138 11/07/16 0046 11/07/16 0717  HGB 10.4* 10.6*  --   --   --   --  10.8*  HCT 34.5* 35.0*  --   --   --   --  35.6*  PLT 187 200  --   --   --   --  226  APTT  --   --   --  52* 52*  --  >200*  LABPROT  --   --   --   --   --  28.5*  --   INR  --   --   --   --   --  2.61  --   HEPARINUNFRC  --   --   < > >2.20* >2.20*  --  >2.20*  CREATININE 1.01* 1.14*  --   --   --   --  1.06*  < > = values in this interval not displayed.  Estimated Creatinine Clearance: 64.9 mL/min (by C-G formula based on SCr of 1.06 mg/dL (H)).  Assessment:   66 yr old female on Xarelto prior to admission for hx afib and prior CVA, transitioned to IV heparin on 3/4 for cardiac cath today.  Last Xarelto dose 11/05/16 ~5pm; using aPTTs for heparin monitoring while heparin levels are skewed  by recent Xarelto doses.    Heparin drip increased from 1400 to 1700 units/hr overnight, when aPTT only 52 seconds.  Up to >200 seconds this morning.  Patient reports drawn from right arm; heparin infusing via left arm.   No bleeding or infusion problems.  Goal of Therapy:  Heparin level 0.3-0.7 units/ml aPTT 66-102 seconds Monitor platelets by anticoagulation protocol: Yes   Plan:   Decrease heparin drip to 1550 units/hr.  Will follow up post-cath for anticoagulation plans.  Daily heparin level, aPTT and CBC while on heparin.  Xarelto on hold.  Arty Baumgartner, [place] Pager: [phone] 11/07/2016,9:59 AM

## 2016-11-07 NOTE — Progress Notes (Signed)
11/07/16 1330  Clinical Encounter Type  Visited With Patient  Visit Type Other (Comment) ([place] consult)  Spiritual Encounters  Spiritual Needs Prayer  Stress Factors  Patient Stress Factors None identified  Pt reported not being suicidal. Offered prayer as her preference.

## 2016-11-07 NOTE — Progress Notes (Signed)
Pt refuses CPAP tonight. RN to call this RT if pt changes her mind

## 2016-11-08 ENCOUNTER — Encounter (HOSPITAL_COMMUNITY): Payer: Self-pay | Admitting: Cardiology

## 2016-11-08 DIAGNOSIS — I270 Primary pulmonary hypertension: Secondary | ICD-10-CM

## 2016-11-08 LAB — CBC
HEMATOCRIT: 35.7 % — AB (ref 36.0–46.0)
HEMOGLOBIN: 10.6 g/dL — AB (ref 12.0–15.0)
MCH: 28.7 pg (ref 26.0–34.0)
MCHC: 29.7 g/dL — ABNORMAL LOW (ref 30.0–36.0)
MCV: 96.7 fL (ref 78.0–100.0)
Platelets: 207 10*3/uL (ref 150–400)
RBC: 3.69 MIL/uL — ABNORMAL LOW (ref 3.87–5.11)
RDW: 20.5 % — ABNORMAL HIGH (ref 11.5–15.5)
WBC: 7.2 10*3/uL (ref 4.0–10.5)

## 2016-11-08 LAB — BASIC METABOLIC PANEL
ANION GAP: 10 (ref 5–15)
BUN: 18 mg/dL (ref 6–20)
CALCIUM: 9.3 mg/dL (ref 8.9–10.3)
CO2: 37 mmol/L — ABNORMAL HIGH (ref 22–32)
Chloride: 90 mmol/L — ABNORMAL LOW (ref 101–111)
Creatinine, Ser: 1.07 mg/dL — ABNORMAL HIGH (ref 0.44–1.00)
GFR, EST NON AFRICAN AMERICAN: 52 mL/min — AB (ref >60)
Glucose, Bld: 92 mg/dL (ref 65–99)
POTASSIUM: 4 mmol/L (ref 3.5–5.1)
SODIUM: 137 mmol/L (ref 135–145)

## 2016-11-08 LAB — MAGNESIUM: MAGNESIUM: 2 mg/dL (ref 1.7–2.4)

## 2016-11-08 LAB — PHOSPHORUS: PHOSPHORUS: 4.6 mg/dL (ref 2.5–4.6)

## 2016-11-08 MED ORDER — POTASSIUM CHLORIDE CRYS ER 20 MEQ PO TBCR
40.0000 meq | EXTENDED_RELEASE_TABLET | Freq: Every day | ORAL | Status: DC
  Administered 2016-11-08: 40 meq via ORAL
  Filled 2016-11-08: qty 2

## 2016-11-08 MED ORDER — FUROSEMIDE 10 MG/ML IJ SOLN
120.0000 mg | Freq: Three times a day (TID) | INTRAVENOUS | Status: DC
  Administered 2016-11-08 – 2016-11-11 (×9): 120 mg via INTRAVENOUS
  Filled 2016-11-08 (×10): qty 12

## 2016-11-08 MED ORDER — DEXTROSE 5 % IV SOLN: 120.0000 mg | Freq: Two times a day (BID) | INTRAVENOUS | Status: DC

## 2016-11-08 MED ORDER — METOLAZONE 5 MG PO TABS
5.0000 mg | ORAL_TABLET | Freq: Two times a day (BID) | ORAL | Status: DC
  Administered 2016-11-08 – 2016-11-13 (×10): 5 mg via ORAL
  Filled 2016-11-08 (×10): qty 1

## 2016-11-08 MED ORDER — METHYLPREDNISOLONE SODIUM SUCC 125 MG IJ SOLR
125.0000 mg | Freq: Once | INTRAMUSCULAR | Status: AC
  Administered 2016-11-08: 125 mg via INTRAVENOUS
  Filled 2016-11-08: qty 2

## 2016-11-08 MED ORDER — POTASSIUM CHLORIDE CRYS ER 20 MEQ PO TBCR: 20.0000 meq | EXTENDED_RELEASE_TABLET | Freq: Once | ORAL | Status: DC

## 2016-11-08 MED ORDER — MAGNESIUM SULFATE 2 GM/50ML IV SOLN
2.0000 g | Freq: Once | INTRAVENOUS | Status: AC
Start: 2016-11-08 — End: 2016-11-08
  Administered 2016-11-08: 2 g via INTRAVENOUS
  Filled 2016-11-08: qty 50

## 2016-11-08 MED ORDER — ACETAZOLAMIDE 250 MG PO TABS
250.0000 mg | ORAL_TABLET | Freq: Every day | ORAL | Status: DC
  Administered 2016-11-08 – 2016-11-09 (×2): 250 mg via ORAL
  Filled 2016-11-08 (×3): qty 1

## 2016-11-08 MED ORDER — METHYLPREDNISOLONE SODIUM SUCC 40 MG IJ SOLR
40.0000 mg | Freq: Two times a day (BID) | INTRAMUSCULAR | Status: DC
  Administered 2016-11-08 – 2016-11-09 (×2): 40 mg via INTRAVENOUS
  Filled 2016-11-08 (×2): qty 1

## 2016-11-08 MED ORDER — POTASSIUM CHLORIDE CRYS ER 20 MEQ PO TBCR
40.0000 meq | EXTENDED_RELEASE_TABLET | Freq: Two times a day (BID) | ORAL | Status: DC
  Administered 2016-11-08: 40 meq via ORAL
  Filled 2016-11-08: qty 2

## 2016-11-08 MED ORDER — FUROSEMIDE 10 MG/ML IJ SOLN: 80.0000 mg | Freq: Two times a day (BID) | INTRAMUSCULAR | Status: DC

## 2016-11-08 NOTE — Progress Notes (Signed)
Subjective:    Patient ID: [name], female    DOB: Sep 05, 1951, 66 y.o.   MRN: [id number]  Synopsis: Referred in 2017 to the [place] pulmonary clinic for evaluation of chronic respiratory failure with hypoxemia and likely pulmonary hypertension. She has a past medical history significant for stroke, diastolic heart failure, atrial fibrillation. She also has obstructive sleep apnea and is status post U P3 surgery.  She ambulated approximately 100 feet on 4 L with the Oxymizer and her O2 saturation dropped to 61% and her heart rate dropped to 41 while becoming cyanotic. It took approximately 10 minutes with an O2 flow rate at 6 L nasal cannula through an Oxymizer for her O2 saturation to reach 90% and her heart rate to improve to the 80s to 90s and her color to change to pink again.   SUBJECTIVE/OVERNIGHT/INTERVAL HX:   Cath yesterday 3/5 confirms severe pulm HTN and volume overload (see results below).  Hypoxia overnight, given more O2 and more lasix.   OBJECTIVE BP 121/75 (BP Location: Left Arm)   Pulse 90   Temp 97.3 F (36.3 C) (Axillary)   Resp (!) 22   Ht _0  (1.549 m)   Wt 133.3 kg (293 lb 14.4 oz)   LMP 05/06/2013   SpO2 100%   BMI 55.53 kg/m   Physical Exam: General: Obese female, resting in bed, in NAD. Neuro: A&O x 3, no deficits. HEENT: [place] / AT. MM moist. Cardiovascular:  IRIR, no M/R/G. Lungs: Diffuse expiratory wheezes bilaterally. Abdomen: Obese, BS hypoactive.  Soft/NT/ND. Musculoskeletal: No deformities, edema difficult to appreciate given body habitus. Skin: Warm, dry.   PULMONARY  Recent Labs Lab 11/04/16 1630 11/06/16 2345  11/07/16 1508 11/07/16 1517 11/07/16 1519 11/07/16 1525 11/07/16 1528 11/07/16 2154  PHART 7.454* 7.369  --   --   --   --   --   --  7.328*  PCO2ART 52.1* 66.6*  --   --   --   --   --   --  68.3*  PO2ART 57.7* 54.5*  --   --   --   --   --   --  61.2*  HCO3 36.2* 37.4*  < > 37.5* 39.3* 38.7* 37.7* 38.6* 34.8*    TCO2  --   --   < > 39 41 41 40 41  --   O2SAT 89.2 83.4  < > 37.0 41.0 39.0 38.0 33.0 86.8  < > = values in this interval not displayed.  CBC  Recent Labs Lab 11/06/16 0221 11/07/16 0717 11/08/16 0454  HGB 10.6* 10.8* 10.6*  HCT 35.0* 35.6* 35.7*  WBC 7.8 7.6 7.2  PLT 200 226 207    COAGULATION  Recent Labs Lab 11/07/16 0046  INR 2.61    CARDIAC  No results for input(s): TROPONINI in the last 168 hours. No results for input(s): PROBNP in the last 168 hours.   CHEMISTRY  Recent Labs Lab 11/04/16 1656 11/05/16 0319 11/06/16 0221 11/07/16 0717 11/08/16 0454  NA 140 140 137 135 137  K 3.0* 3.0* 3.5 3.5 4.0  CL 90* 91* 90* 93* 90*  CO2 37* 36* 37* 32 37*  GLUCOSE 93 108* 113* 91 92  BUN _1 CREATININE 0.97 1.01* 1.14* 1.06* 1.07*  CALCIUM 9.2 9.2 9.1 9.1 9.3  MG 1.4*  --  2.1 1.6* 2.0  PHOS 4.2  --  4.5 4.0 4.6   Estimated Creatinine Clearance: 66 mL/min (by C-G  formula based on SCr of 1.07 mg/dL (H)).   LIVER  Recent Labs Lab 11/04/16 1656 11/07/16 0046  AST 32  --   ALT 9*  --   ALKPHOS 140*  --   BILITOT 2.7*  --   PROT 6.4*  --   ALBUMIN 3.3*  --   INR  --  2.61     INFECTIOUS No results for input(s): LATICACIDVEN, PROCALCITON in the last 168 hours.   ENDOCRINE CBG (last 3)  No results for input(s): GLUCAP in the last 72 hours.   IMAGING x48h  - image(s) personally visualized  -   highlighted in bold Dg Chest Port 1 View  Result Date: 11/07/2016 CLINICAL DATA:  Hypoxemia EXAM: PORTABLE CHEST 1 VIEW COMPARISON:  11/04/2016 CXR, chest CT 10/06/2016 FINDINGS: Low lung volumes with crowding of interstitial lung markings. Bilateral pleural effusions right greater than left. Mild vascular congestion is noted though decreased since prior exam. Bilateral shoulder arthroplasties are present. No acute osseous abnormality. IMPRESSION: Cardiomegaly with bilateral pleural effusions right slightly greater than left. Mild vascular  congestion. Electronically Signed   By: Ashley Royalty M.D.   On: 11/07/2016 00:10     Baseline Testing: ABG 10/06/2016 performed on oxygen > 7.43/53.1/65.0/34.7 PFT's Jan 2018 > FVC 0.88 L, 32% predicted, normal ratio, DLCO 7.18 (35% pred). High Res CT chest 10/06/16 > normal pulmonary parenchyma with the exception of interlobular septal thickening and groundglass worse in the dependent sections of all lobes, there is a moderate size right-sided pleural effusion, pulmonary vascular engorgement. VQ 10/06/16 > No evidence of pulmonary embolism, decreased ventilation and perfusion to the right lower lobe which corresponds to the large effusion on CT Echo Dec 2017 > LVEF 55-60%, PAP 77 Echo 11/06/16 > EF 55-60%, PAP 90 RHC 03/05 > PCWP 63, LVEDP 28, index 2.15.  EVENTS 11/04/2016 - admit 11/05/16 - feels very air hungry. On 6L [place] pulse ox 84-88%. Claustrophobic with bipap last night per RN. On diursis but not making much urine. She is worried about add on aspirin to xarelto due to bleeding risk      Assessment & Plan:   66 year old female with multiple comorbid illnesses presented to the pulmonary clinic on 11/04/2016 with acute respiratory failure with hypoxemia and profound dyspnea in the setting of decompensated congestive heart failure. 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    ASSESSMENT and PLAN  Acute on chronic hypoxic respiratory failure - multifactorial due to OSA, hypoventilation from restrictive lung disease due to obesity, and also now possible component of slight R to L shunt (echo 03/04 with possible mild shunt but difficult to assess due to body habitus.  NO mention of shunt on cath). Pulmonary Hypertension - likely multifactorial group 2 (dCHF and possible mild R to L shunt) group 3 (restrictive lung disease due to obesity). OSA / OHS. Bilateral pleural effusions, R >  L. Bronchospasm on exam 03/06. Plan: Continue supplemental O2 as needed to maintain SpO2 > 92%. CPAP QHS PRN (has not been compliant). Continue diuresis - metolazone added 03/05 and dose increased 03/06.  May need to increase lasix further as well (currently on 63m BID).  Potassium and Magnesium supplements added. Mobilize as able. Solumedrol 1278mx 1, then start 401mID for now and wean to off.  Chronic A.fib (on xarelto). Acute on chronic dCHF - with severe right sided heart failure / cor pulmonale as seen on cath  03/05. Plan: Cardiology following / managing, appreciate the assistance.  Hypothyroidism. Plan: Continue preadmission synthroid.   Best Practice: Code Status:  Full Diet:  Heart healthy. Prophylaxis: SCD's / Heparin.   FAMILY  - Updates: No family available 03/05.  - Inter-disciplinary family meet or Palliative Care meeting due by:  Day 7. Current LOS is LOS 4 days   Montey Hora, [place] [facility] Pager: [phone]  or [phone] 11/08/2016, 9:15 AM
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
## 2016-11-08 NOTE — Progress Notes (Signed)
Pt refuses cpap at this time.  Rt will monitor.

## 2016-11-09 LAB — BASIC METABOLIC PANEL
ANION GAP: 11 (ref 5–15)
BUN: 20 mg/dL (ref 6–20)
CALCIUM: 9.1 mg/dL (ref 8.9–10.3)
CHLORIDE: 85 mmol/L — AB (ref 101–111)
CO2: 38 mmol/L — AB (ref 22–32)
Creatinine, Ser: 1.12 mg/dL — ABNORMAL HIGH (ref 0.44–1.00)
GFR calc non Af Amer: 50 mL/min — ABNORMAL LOW (ref >60)
GFR, EST AFRICAN AMERICAN: 58 mL/min — AB (ref >60)
Glucose, Bld: 244 mg/dL — ABNORMAL HIGH (ref 65–99)
POTASSIUM: 3.5 mmol/L (ref 3.5–5.1)
Sodium: 134 mmol/L — ABNORMAL LOW (ref 135–145)

## 2016-11-09 LAB — CBC
HEMATOCRIT: 32.9 % — AB (ref 36.0–46.0)
HEMOGLOBIN: 9.8 g/dL — AB (ref 12.0–15.0)
MCH: 28.4 pg (ref 26.0–34.0)
MCHC: 29.8 g/dL — ABNORMAL LOW (ref 30.0–36.0)
MCV: 95.4 fL (ref 78.0–100.0)
Platelets: 175 10*3/uL (ref 150–400)
RBC: 3.45 MIL/uL — AB (ref 3.87–5.11)
RDW: 19.9 % — AB (ref 11.5–15.5)
WBC: 5.8 10*3/uL (ref 4.0–10.5)

## 2016-11-09 LAB — GLUCOSE, CAPILLARY
GLUCOSE-CAPILLARY: 194 mg/dL — AB (ref 65–99)
GLUCOSE-CAPILLARY: 182 mg/dL — AB (ref 65–99)
GLUCOSE-CAPILLARY: 192 mg/dL — AB (ref 65–99)

## 2016-11-09 LAB — RHEUMATOID FACTOR: Rhuematoid fact SerPl-aCnc: <10 IU/mL (ref 0.0–13.9)

## 2016-11-09 LAB — ANTINUCLEAR ANTIBODIES, IFA: ANA Ab, IFA: NEGATIVE

## 2016-11-09 LAB — PHOSPHORUS: PHOSPHORUS: 4.4 mg/dL (ref 2.5–4.6)

## 2016-11-09 LAB — MAGNESIUM: Magnesium: 1.9 mg/dL (ref 1.7–2.4)

## 2016-11-09 LAB — ANTI-SCLERODERMA ANTIBODY: Scleroderma (Scl-70) (ENA) Antibody, IgG: <0.2 AI (ref 0.0–0.9)

## 2016-11-09 MED ORDER — METHYLPREDNISOLONE SODIUM SUCC 40 MG IJ SOLR
40.0000 mg | Freq: Every day | INTRAMUSCULAR | Status: DC
Start: 2016-11-10 — End: 2016-11-11
  Administered 2016-11-10 – 2016-11-11 (×2): 40 mg via INTRAVENOUS
  Filled 2016-11-09 (×2): qty 1

## 2016-11-09 MED ORDER — INSULIN ASPART 100 UNIT/ML ~~LOC~~ SOLN
0.0000 [IU] | Freq: Three times a day (TID) | SUBCUTANEOUS | Status: DC
  Administered 2016-11-10 (×2): 2 [IU] via SUBCUTANEOUS
  Administered 2016-11-10: 3 [IU] via SUBCUTANEOUS
  Administered 2016-11-11 – 2016-11-12 (×4): 2 [IU] via SUBCUTANEOUS
  Administered 2016-11-13: 3 [IU] via SUBCUTANEOUS
  Administered 2016-11-14 – 2016-11-16 (×3): 2 [IU] via SUBCUTANEOUS

## 2016-11-09 MED ORDER — POTASSIUM CHLORIDE CRYS ER 20 MEQ PO TBCR
60.0000 meq | EXTENDED_RELEASE_TABLET | Freq: Two times a day (BID) | ORAL | Status: DC
  Administered 2016-11-09 – 2016-11-14 (×11): 60 meq via ORAL
  Filled 2016-11-09 (×11): qty 3
  Filled 2016-11-09: qty 6

## 2016-11-09 MED ORDER — INSULIN ASPART 100 UNIT/ML ~~LOC~~ SOLN
0.0000 [IU] | SUBCUTANEOUS | Status: DC
  Administered 2016-11-09 (×2): 3 [IU] via SUBCUTANEOUS

## 2016-11-09 MED ORDER — INSULIN ASPART 100 UNIT/ML ~~LOC~~ SOLN: 0.0000 [IU] | Freq: Every day | SUBCUTANEOUS | Status: DC

## 2016-11-09 NOTE — Progress Notes (Signed)
Patient ID: ARILYN BRIERLEY, female   DOB: Aug 06, 1951, 66 y.o.   MRN: [id number]   SUBJECTIVE: Patient diuresed well yesterday, weight down 8 lbs.  Creatinine stable.   RHC 11/07/16 TPG 50-5m Hg PAP mean: 55 mmHg PCWP: 63 mmHg (not accurate).  LVEDP: 28.  PVR using LVEDP and mean PA 5.7 WU.  Cardiac output/index by Fick: 4.74/2.15. RV pressures/EDP: 97/13/21 mmHg Shunt run showed no left to right shunt.   Scheduled Meds: . acetaZOLAMIDE  250 mg Oral Daily  . allopurinol  200 mg Oral Daily  . atenolol  50 mg Oral Daily   And  . atenolol  25 mg Oral QHS  . furosemide  120 mg Intravenous Q8H  . levalbuterol  0.63 mg Nebulization QID  . levothyroxine  75 mcg Oral QAC breakfast  . methylPREDNISolone (SOLU-MEDROL) injection  40 mg Intravenous Q12H  . metolazone  5 mg Oral BID  . potassium chloride  60 mEq Oral BID  . rivaroxaban  20 mg Oral Q supper  . sodium chloride flush  3 mL Intravenous Q12H   Continuous Infusions: PRN Meds:.sodium chloride, sodium chloride, levalbuterol, methocarbamol, ondansetron (ZOFRAN) IV, sodium chloride flush, traMADol    Vitals:   11/09/16 0000 11/09/16 0124 11/09/16 0200 11/09/16 0400  BP: 106/61  (!) 115/54 120/73  Pulse: 91 73 84 85  Resp: _0 Temp: 97.4 F (36.3 C)   97.7 F (36.5 C)  TempSrc: Oral   Oral  SpO2: (!) 84% 92% 93% (!) 86%  Weight:    285 lb (129.3 kg)  Height:        Intake/Output Summary (Last 24 hours) at 11/09/16 0737 Last data filed at 11/09/16 0640  Gross per 24 hour  Intake              364 ml  Output             4650 ml  Net            -4286 ml    LABS: Basic Metabolic Panel:  Recent Labs  11/08/16 0454 11/09/16 0240  NA 137 134*  K 4.0 3.5  CL 90* 85*  CO2 37* 38*  GLUCOSE 92 244*  BUN 18 20  CREATININE 1.07* 1.12*  CALCIUM 9.3 9.1  MG 2.0 1.9  PHOS 4.6 4.4   Liver Function Tests: No results for input(s): AST, ALT, ALKPHOS, BILITOT, PROT, ALBUMIN in the last 72 hours. No results for  input(s): LIPASE, AMYLASE in the last 72 hours. CBC:  Recent Labs  11/07/16 0717 11/08/16 0454 11/09/16 0240  WBC 7.6 7.2 5.8  NEUTROABS 5.7  --   --   HGB 10.8* 10.6* 9.8*  HCT 35.6* 35.7* 32.9*  MCV 94.7 96.7 95.4  PLT 226 207 175   Cardiac Enzymes: No results for input(s): CKTOTAL, CKMB, CKMBINDEX, TROPONINI in the last 72 hours. BNP: Invalid input(s): POCBNP D-Dimer: No results for input(s): DDIMER in the last 72 hours. Hemoglobin A1C: No results for input(s): HGBA1C in the last 72 hours. Fasting Lipid Panel: No results for input(s): CHOL, HDL, LDLCALC, TRIG, CHOLHDL, LDLDIRECT in the last 72 hours. Thyroid Function Tests: No results for input(s): TSH, T4TOTAL, T3FREE, THYROIDAB in the last 72 hours.  Invalid input(s): FREET3 Anemia Panel: No results for input(s): VITAMINB12, FOLATE, FERRITIN, TIBC, IRON, RETICCTPCT in the last 72 hours.  RADIOLOGY: Dg Chest Port 1 View  Result Date: 11/07/2016 CLINICAL DATA:  Hypoxemia EXAM: PORTABLE CHEST 1 VIEW COMPARISON:  11/04/2016 CXR, chest CT 10/06/2016 FINDINGS: Low lung volumes with crowding of interstitial lung markings. Bilateral pleural effusions right greater than left. Mild vascular congestion is noted though decreased since prior exam. Bilateral shoulder arthroplasties are present. No acute osseous abnormality. IMPRESSION: Cardiomegaly with bilateral pleural effusions right slightly greater than left. Mild vascular congestion. Electronically Signed   By: Ashley Royalty M.D.   On: 11/07/2016 00:10   Dg Chest Port 1 View  Result Date: 11/04/2016 CLINICAL DATA:  66 year old female with chronic respiratory failure, likely pulmonary hypertension. Admitted today for Acute on chronic respiratory failure, possibly acute decompensated heart failure. Initial encounter. EXAM: PORTABLE CHEST 1 VIEW COMPARISON:  Chest radiographs 10/06/2016 and earlier. High-resolution chest CT 10/06/2016. FINDINGS: Portable AP upright view at 1632 hours.  Stable cardiomegaly and mediastinal contours. Chronic retrocardiac hypoventilation. Veiling opacity at the right lung base has not significantly changed and there was a moderate size right layering pleural effusion on the 10/06/2016 CT. Associated right perihilar atelectasis. No superimposed pneumothorax. No overt pulmonary edema. IMPRESSION: Suspect stable chest since the chest CT on 10/06/2016; moderate size right pleural effusion and bibasilar atelectasis in the setting of chronic cardiomegaly. Electronically Signed   By: Genevie Ann M.D.   On: 11/04/2016 16:55    PHYSICAL EXAM General: NAD, obese Neck: JVP 14+ cm, no thyromegaly or thyroid nodule.  Lungs: Clear to auscultation bilaterally with normal respiratory effort. CV: Nondisplaced PMI.  Heart regular S1/S2, no S3/S4, no murmur.  1+ ankle edema.   Abdomen: Soft, nontender, no hepatosplenomegaly, no distention.  Neurologic: Alert and oriented x 3.  Psych: Normal affect. Extremities: No clubbing or cyanosis.   TELEMETRY: Reviewed telemetry pt in atrial fibrillation  ASSESSMENT AND PLAN: 66 yo with morbid obesity, OHS/OSA, chronic atrial fibrillation with prior CVA was admitted with acute on chronic diastolic CHF.  1. Acute on chronic diastolic CHF with severe pulmonary hypertension and prominent RV failure: She is markedly volume overloaded.  She needs extensive diuresis.  She diuresed well yesterday.  - Continue Lasix 120 mg IV every 8 hrs + metolazone 5 mg bid. Replace K. Creatinine stable.   - Continue acetazolamide 250 mg daily.  - If she diureses poorly/creatinine rises, will need to consider PICC placement for CVP/co-ox and use of milrinone to support RV.  2. Atrial fibrillation: Chronic, she is on Xarelto + atenolol, continue for now.  3. OHS/OSA: Followed by Dr. [place] Bells.  Suspected severe OSA but has not yet had sleep study.  On oxygen at home.  4. 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   PVR 5.7 WU.  Suspect group 2 (elevated LA pressure) and group 3 (OHS/OSA) PH.  However, cannot rule out group 1 component.  She had V/Q scan that was not suggestive of chronic PE and high resolution chest CT that was not suggestive of ILD.   - Check ANA, RF (negative), anti-SCL70.  - After she has been effectively diuresed with decreased PCWP, would give a trial of sildenafil.  - She will need oxygen during the day and CPAP at night (needs sleep study).   Loralie Champagne 11/09/2016 7:42 AM

## 2016-11-09 NOTE — Care Management Note (Signed)
Case Management Note  Patient Details  Name: CHANTELL KUNKLER MRN: [id number] Date of Birth: [birth year]  Subjective/Objective:   Pt admitted with acute on chronic resp failure with hypoxemia                Action/Plan:   PTA from home on supplemental oxygen 2-4 liters.  Pt is still requiring HFNC, aggressive treatment with IV lasix in addition to zaroxlyn - may need the addition of milrinone.  IF pt discharges home will need sleep study arranged for potential CPAP.    Will continue to follow   Expected Discharge Date:                  Expected Discharge Plan:     In-House Referral:     Discharge planning Services  CM Consult  Post Acute Care Choice:    Choice offered to:     DME Arranged:    DME Agency:     HH Arranged:    HH Agency:     Status of Service:  In process, will continue to follow  If discussed at Long Length of Stay Meetings, dates discussed:    Additional Comments:  Maryclare Labrador, RN 11/09/2016, 1:39 PM

## 2016-11-09 NOTE — Progress Notes (Signed)
Pt placed CPAP auto mode for the night with a full face mask and 6lt FIO2 bleed in. Pt tol at this time.

## 2016-11-09 NOTE — Progress Notes (Signed)
Inpatient Diabetes Program Recommendations  AACE/ADA: New Consensus Statement on Inpatient Glycemic Control (2015)  Target Ranges:  Prepandial:   less than 140 mg/dL      Peak postprandial:   less than 180 mg/dL (1-2 hours)      Critically ill patients:  140 - 180 mg/dL   Results for HARMONEY, SIENKIEWICZ (MRN [id number]) as of 11/09/2016 10:06  Ref. Range 11/09/2016 02:40  Glucose Latest Ref Range: 65 - 99 mg/dL 244 (H)   Review of Glycemic Control  Diabetes history: None Current orders for Inpatient glycemic control: None  Inpatient Diabetes Program Recommendations:   Patient on IV Solumedrol 40 mg Q12 hours. Patient has hyperglycemia in the mid 200's. Please consider CBGs and Novolog Sensitive Correction TID.  Thanks,  Tama Headings RN, MSN, [facility] Inpatient Diabetes Coordinator Team Pager [phone] (8a-5p)

## 2016-11-09 NOTE — Progress Notes (Addendum)
Subjective:    Patient ID: [name], female    DOB: 1950-12-27, 66 y.o.   MRN: [id number]  Synopsis: Referred in 2017 to the [facility] for evaluation of chronic respiratory failure with hypoxemia and likely pulmonary hypertension. She has a past medical history significant for stroke, diastolic heart failure, atrial fibrillation. She also has obstructive sleep apnea and is status post U P3 surgery.  She ambulated approximately 100 feet on 4 L with the Oxymizer and her O2 saturation dropped to 61% and her heart rate dropped to 41 while becoming cyanotic. It took approximately 10 minutes with an O2 flow rate at 6 L nasal cannula through an Oxymizer for her O2 saturation to reach 90% and her heart rate to improve to the 80s to 90s and her color to change to pink again.   SUBJECTIVE/OVERNIGHT/INTERVAL HX:    No issues overnight. Continue to refuse CPAP.  OBJECTIVE BP 111/87 (BP Location: Right Leg)   Pulse 79   Temp 97.5 F (36.4 C) (Oral)   Resp 19   Ht 5' 1" (1.549 m)   Wt 285 lb (129.3 kg)   LMP 05/06/2013   SpO2 97%   BMI 53.85 kg/m   Physical Exam: Gen: No acute distress HEENT:  EOMI, sclera anicteric Neck:     No masses; no thyromegaly Lungs:    Clear to auscultation bilaterally; normal respiratory effort CV:         Regular rate and rhythm; no murmurs Abd:      + bowel sounds; soft, non-tender; no palpable masses, no distension Ext:    2-3 +edema; adequate peripheral perfusion Skin:      Warm and dry; no rash Neuro: alert and oriented x 3 Psych: normal mood and affect   PULMONARY  Recent Labs Lab 11/04/16 1630 11/06/16 2345  11/07/16 1508 11/07/16 1517 11/07/16 1519 11/07/16 1525 11/07/16 1528 11/07/16 2154  PHART 7.454* 7.369  --   --   --   --   --   --  7.328*  PCO2ART 52.1* 66.6*  --   --   --   --   --   --  68.3*  PO2ART 57.7* 54.5*  --   --   --   --   --   --  61.2*  HCO3 36.2* 37.4*  < > 37.5* 39.3* 38.7* 37.7* 38.6* 34.8*    TCO2  --   --   < > 39 41 41 40 41  --   O2SAT 89.2 83.4  < > 37.0 41.0 39.0 38.0 33.0 86.8  < > = values in this interval not displayed.  CBC  Recent Labs Lab 11/07/16 0717 11/08/16 0454 11/09/16 0240  HGB 10.8* 10.6* 9.8*  HCT 35.6* 35.7* 32.9*  WBC 7.6 7.2 5.8  PLT 226 207 175    COAGULATION  Recent Labs Lab 11/07/16 0046  INR 2.61    CARDIAC  No results for input(s): TROPONINI in the last 168 hours. No results for input(s): PROBNP in the last 168 hours.   CHEMISTRY  Recent Labs Lab 11/04/16 1656 11/05/16 0319 11/06/16 0221 11/07/16 0717 11/08/16 0454 11/09/16 0240  NA 140 140 137 135 137 134*  K 3.0* 3.0* 3.5 3.5 4.0 3.5  CL 90* 91* 90* 93* 90* 85*  CO2 37* 36* 37* 32 37* 38*  GLUCOSE 93 108* 113* 91 92 244*  BUN _0 CREATININE 0.97 1.01* 1.14* 1.06* 1.07* 1.12*  CALCIUM 9.2 9.2 9.1 9.1 9.3 9.1  MG 1.4*  --  2.1 1.6* 2.0 1.9  PHOS 4.2  --  4.5 4.0 4.6 4.4   Estimated Creatinine Clearance: 61.9 mL/min (by C-G formula based on SCr of 1.12 mg/dL (H)).   LIVER  Recent Labs Lab 11/04/16 1656 11/07/16 0046  AST 32  --   ALT 9*  --   ALKPHOS 140*  --   BILITOT 2.7*  --   PROT 6.4*  --   ALBUMIN 3.3*  --   INR  --  2.61    INFECTIOUS No results for input(s): LATICACIDVEN, PROCALCITON in the last 168 hours.   ENDOCRINE CBG (last 3)  No results for input(s): GLUCAP in the last 72 hours.   IMAGING x48h  - image(s) personally visualized  -   highlighted in bold No results found.   Baseline Testing: ABG 10/06/2016 performed on oxygen > 7.43/53.1/65.0/34.7 PFT's Jan 2018 > FVC 0.88 L, 32% predicted, normal ratio, DLCO 7.18 (35% pred). High Res CT chest 10/06/16 > normal pulmonary parenchyma with the exception of interlobular septal thickening and groundglass worse in the dependent sections of all lobes, there is a moderate size right-sided pleural effusion, pulmonary vascular engorgement. VQ 10/06/16 > No evidence of  pulmonary embolism, decreased ventilation and perfusion to the right lower lobe which corresponds to the large effusion on CT Echo Dec 2017 > LVEF 55-60%, PAP 77 Echo 11/06/16 > EF 55-60%, PAP 90 RHC 03/05 > PCWP 63, LVEDP 28, index 2.15.  EVENTS 11/04/16 - admit 11/07/16- Right heart cath  ASSESSMENT and PLAN: 66 year old with acute on chronic hypoxic respiratory failure, diastolic heart failure, pulmonary hypertension, untreated OSA. No evidence of COPD, ILD or thromboemolism. PFTs show restriction with DLCO impairement and no obstruction.  Underwent right heart catheterization which shows severe pulmonary hypertension with elevated LV filling pressures.  RHC- PAP 55, LVEDP 28, Wedge not accurate. PVR 455 (5.7 woods)  No shunt noted  Resp: Acute on chronic hypoxic respiratory failure - multifactorial due to OSA, hypoventilation from restrictive lung disease due to obesity Pulmonary Hypertension - likely multifactorial group 2 (dCHF) group 3 (Suspected OSA, restrictive lung disease from obesity). OSA / OHS. Bilateral pleural effusions, R > L.  Plan: - Continue supplemental O2. Wean as tolerated - Encourage CPAP use at night. Pt has been non compliant.  - Continue diuresis - Follow labs for [facility] workup - Get OOB, mobilize  CVS: Chronic A.fib (on xarelto). Acute on chronic dCHF Marked volume overload  Plan: Cardiology following / managing, appreciate the assistance. Continue aggressive diuresis Continue xarelto  Endocrine: Elevated blood sugars while on steroids Hypothyroidism.  Plan: Continue synthroid. TSH is elevated. Check free T3, T4 Add SSI for elevated BS Taper steroids to solumedrol 40 mg qd  Best Practice: Code Status:  Full Diet:  Heart healthy. Prophylaxis: SCD's / Heparin.  FAMILY  - Updates: Patient updated daily. - Inter-disciplinary family meet or Palliative Care meeting due by:  Day 7. Current LOS is LOS 5 days  Marshell Garfinkel MD [facility] Pager [phone] If no answer or after 3pm call: [phone] 11/09/2016, 10:52 AM

## 2016-11-09 NOTE — Progress Notes (Signed)
eLink Physician-Brief Progress Note Patient Name: [name] DOB: 1951/05/24 MRN: [id number]   Date of Service  11/09/2016  HPI/Events of Note  Glu now eating Change to ac hs  eICU Interventions       Intervention Category Intermediate Interventions: Hyperglycemia - evaluation and treatment  Raylene Miyamoto. 11/09/2016, 8:37 PM

## 2016-11-10 LAB — BASIC METABOLIC PANEL
ANION GAP: 11 (ref 5–15)
BUN: 24 mg/dL — ABNORMAL HIGH (ref 6–20)
CO2: 39 mmol/L — AB (ref 22–32)
Calcium: 9 mg/dL (ref 8.9–10.3)
Chloride: 85 mmol/L — ABNORMAL LOW (ref 101–111)
Creatinine, Ser: 1.12 mg/dL — ABNORMAL HIGH (ref 0.44–1.00)
GFR calc non Af Amer: 50 mL/min — ABNORMAL LOW (ref >60)
GFR, EST AFRICAN AMERICAN: 58 mL/min — AB (ref >60)
Glucose, Bld: 151 mg/dL — ABNORMAL HIGH (ref 65–99)
POTASSIUM: 3.6 mmol/L (ref 3.5–5.1)
Sodium: 135 mmol/L (ref 135–145)

## 2016-11-10 LAB — GLUCOSE, CAPILLARY
GLUCOSE-CAPILLARY: 141 mg/dL — AB (ref 65–99)
GLUCOSE-CAPILLARY: 199 mg/dL — AB (ref 65–99)
GLUCOSE-CAPILLARY: 195 mg/dL — AB (ref 65–99)
Glucose-Capillary: 145 mg/dL — ABNORMAL HIGH (ref 65–99)

## 2016-11-10 LAB — CBC
HEMATOCRIT: 29.6 % — AB (ref 36.0–46.0)
Hemoglobin: 9 g/dL — ABNORMAL LOW (ref 12.0–15.0)
MCH: 28.9 pg (ref 26.0–34.0)
MCHC: 30.4 g/dL (ref 30.0–36.0)
MCV: 95.2 fL (ref 78.0–100.0)
Platelets: 159 10*3/uL (ref 150–400)
RBC: 3.11 MIL/uL — AB (ref 3.87–5.11)
RDW: 20 % — AB (ref 11.5–15.5)
WBC: 9.3 10*3/uL (ref 4.0–10.5)

## 2016-11-10 LAB — TSH: TSH: 1.445 u[IU]/mL (ref 0.350–4.500)

## 2016-11-10 LAB — MAGNESIUM: Magnesium: 1.8 mg/dL (ref 1.7–2.4)

## 2016-11-10 LAB — T4, FREE: Free T4: 1.26 ng/dL — ABNORMAL HIGH (ref 0.61–1.12)

## 2016-11-10 LAB — PHOSPHORUS: PHOSPHORUS: 3.7 mg/dL (ref 2.5–4.6)

## 2016-11-10 MED ORDER — ACETAZOLAMIDE 250 MG PO TABS
500.0000 mg | ORAL_TABLET | Freq: Every day | ORAL | Status: DC
Start: 2016-11-10 — End: 2016-11-11
  Administered 2016-11-10 – 2016-11-11 (×2): 500 mg via ORAL
  Filled 2016-11-10 (×2): qty 2

## 2016-11-10 MED ORDER — MAGNESIUM SULFATE 2 GM/50ML IV SOLN
2.0000 g | Freq: Once | INTRAVENOUS | Status: AC
  Administered 2016-11-10: 2 g via INTRAVENOUS
  Filled 2016-11-10: qty 50

## 2016-11-10 NOTE — Progress Notes (Signed)
Pt removed CPAP not wanting to wear anymore tonight. Placed back on 6lt HFNC

## 2016-11-10 NOTE — Progress Notes (Addendum)
Patient ID: [name], female   DOB: December 29, 1950, 66 y.o.   MRN: [id number] P  SUBJECTIVE: Patient says she diuresed a lot yesterday but not reflective in I/Os.  Weight is down 2 lbs.     She denies dyspnea in bed but remains on 6L [place].   RHC 11/07/16 TPG 50-58m Hg PAP mean: 55 mmHg PCWP: 63 mmHg (not accurate).  LVEDP: 28.  PVR using LVEDP and mean PA 5.7 WU.  Cardiac output/index by Fick: 4.74/2.15. RV pressures/EDP: 97/13/21 mmHg Shunt run showed no left to right shunt.   Scheduled Meds: . acetaZOLAMIDE  250 mg Oral Daily  . allopurinol  200 mg Oral Daily  . atenolol  50 mg Oral Daily   And  . atenolol  25 mg Oral QHS  . furosemide  120 mg Intravenous Q8H  . insulin aspart  0-15 Units Subcutaneous TID WC  . insulin aspart  0-5 Units Subcutaneous QHS  . levalbuterol  0.63 mg Nebulization QID  . levothyroxine  75 mcg Oral QAC breakfast  . methylPREDNISolone (SOLU-MEDROL) injection  40 mg Intravenous Daily  . metolazone  5 mg Oral BID  . potassium chloride  60 mEq Oral BID  . rivaroxaban  20 mg Oral Q supper  . sodium chloride flush  3 mL Intravenous Q12H   Continuous Infusions: PRN Meds:.sodium chloride, sodium chloride, levalbuterol, methocarbamol, ondansetron (ZOFRAN) IV, sodium chloride flush, traMADol    Vitals:   11/10/16 0300 11/10/16 0410 11/10/16 0504 11/10/16 0732  BP:   (!) 77/61 110/60  Pulse: 87  88 81  Resp: 13  (!) 27 18  Temp:   97.3 F (36.3 C) 98.6 F (37 C)  TempSrc:   Oral Oral  SpO2: 95%  93% (!) 87%  Weight:  285 lb 4 oz (129.4 kg) 283 lb 14.4 oz (128.8 kg)   Height:        Intake/Output Summary (Last 24 hours) at 11/10/16 0742 Last data filed at 11/10/16 0511  Gross per 24 hour  Intake              186 ml  Output             1850 ml  Net            -1664 ml    LABS: Basic Metabolic Panel:  Recent Labs  11/09/16 0240 11/10/16 0304  NA 134* 135  K 3.5 3.6  CL 85* 85*  CO2 38* 39*  GLUCOSE 244* 151*  BUN 20 24*  CREATININE  1.12* 1.12*  CALCIUM 9.1 9.0  MG 1.9 1.8  PHOS 4.4 3.7   Liver Function Tests: No results for input(s): AST, ALT, ALKPHOS, BILITOT, PROT, ALBUMIN in the last 72 hours. No results for input(s): LIPASE, AMYLASE in the last 72 hours. CBC:  Recent Labs  11/09/16 0240 11/10/16 0304  WBC 5.8 9.3  HGB 9.8* 9.0*  HCT 32.9* 29.6*  MCV 95.4 95.2  PLT 175 159   Cardiac Enzymes: No results for input(s): CKTOTAL, CKMB, CKMBINDEX, TROPONINI in the last 72 hours. BNP: Invalid input(s): POCBNP D-Dimer: No results for input(s): DDIMER in the last 72 hours. Hemoglobin A1C: No results for input(s): HGBA1C in the last 72 hours. Fasting Lipid Panel: No results for input(s): CHOL, HDL, LDLCALC, TRIG, CHOLHDL, LDLDIRECT in the last 72 hours. Thyroid Function Tests: No results for input(s): TSH, T4TOTAL, T3FREE, THYROIDAB in the last 72 hours.  Invalid input(s): FREET3 Anemia Panel: No results for input(s): VITAMINB12, FOLATE,  FERRITIN, TIBC, IRON, RETICCTPCT in the last 72 hours.  RADIOLOGY: Dg Chest Port 1 View  Result Date: 11/07/2016 CLINICAL DATA:  Hypoxemia EXAM: PORTABLE CHEST 1 VIEW COMPARISON:  11/04/2016 CXR, chest CT 10/06/2016 FINDINGS: Low lung volumes with crowding of interstitial lung markings. Bilateral pleural effusions right greater than left. Mild vascular congestion is noted though decreased since prior exam. Bilateral shoulder arthroplasties are present. No acute osseous abnormality. IMPRESSION: Cardiomegaly with bilateral pleural effusions right slightly greater than left. Mild vascular congestion. Electronically Signed   By: Ashley Royalty M.D.   On: 11/07/2016 00:10   Dg Chest Port 1 View  Result Date: 11/04/2016 CLINICAL DATA:  66 year old female with chronic respiratory failure, likely pulmonary hypertension. Admitted today for Acute on chronic respiratory failure, possibly acute decompensated heart failure. Initial encounter. EXAM: PORTABLE CHEST 1 VIEW COMPARISON:  Chest  radiographs 10/06/2016 and earlier. High-resolution chest CT 10/06/2016. FINDINGS: Portable AP upright view at 1632 hours. Stable cardiomegaly and mediastinal contours. Chronic retrocardiac hypoventilation. Veiling opacity at the right lung base has not significantly changed and there was a moderate size right layering pleural effusion on the 10/06/2016 CT. Associated right perihilar atelectasis. No superimposed pneumothorax. No overt pulmonary edema. IMPRESSION: Suspect stable chest since the chest CT on 10/06/2016; moderate size right pleural effusion and bibasilar atelectasis in the setting of chronic cardiomegaly. Electronically Signed   By: Genevie Ann M.D.   On: 11/04/2016 16:55    PHYSICAL EXAM General: NAD, obese Neck: JVP 14+ cm, no thyromegaly or thyroid nodule.  Lungs: Clear to auscultation bilaterally with normal respiratory effort. CV: Nondisplaced PMI.  Heart regular S1/S2, no S3/S4, no murmur.  1+ ankle edema.   Abdomen: Soft, nontender, no hepatosplenomegaly, no distention.  Neurologic: Alert and oriented x 3.  Psych: Normal affect. Extremities: No clubbing or cyanosis.   TELEMETRY: Reviewed telemetry pt in atrial fibrillation  ASSESSMENT AND PLAN: 66 yo with morbid obesity, OHS/OSA, chronic atrial fibrillation with prior CVA was admitted with acute on chronic diastolic CHF.  1. Acute on chronic diastolic CHF with severe pulmonary hypertension and prominent RV failure: She is markedly volume overloaded.  She needs extensive diuresis.  She diuresed well yesterday.  - Need to be very good about watching I/Os today.  - Continue Lasix 120 mg IV every 8 hrs + metolazone 5 mg bid. Replace K. Creatinine stable.   - Increase acetazolamide to 500 daily.   - If she diureses poorly today orcreatinine rises, will need to consider PICC placement for CVP/co-ox and use of milrinone to support RV.  2. Atrial fibrillation: Chronic, she is on Xarelto + atenolol, continue for now.  3. OHS/OSA:  Followed by Dr. Lake Bells.  Suspected severe OSA but has not yet had sleep study.  On oxygen at home.  4. 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  PVR 5.7 WU.  Suspect group 2 (elevated LA pressure) and group 3 (OHS/OSA) PH.  However, cannot rule out group 1 component.  She had V/Q scan that was not suggestive of chronic PE and high resolution chest CT that was not suggestive of ILD.   - ANA, RF, anti-SCL70 all negative.  - After she has been effectively diuresed with decreased PCWP, would give a trial of sildenafil.  - She will need oxygen during the day and CPAP at night (needs sleep study).  5. Mobilize with PT.   Loralie Champagne 11/10/2016 7:42 AM

## 2016-11-10 NOTE — Progress Notes (Signed)
Subjective:    Patient ID: [name], female    DOB: May 24, 1951, 66 y.o.   MRN: [id number]  Synopsis: Referred in 2017 to the [facility] for evaluation of chronic respiratory failure with hypoxemia and likely pulmonary hypertension. She has a past medical history significant for stroke, diastolic heart failure, atrial fibrillation. She also has obstructive sleep apnea and is status post U P3 surgery.  She ambulated approximately 100 feet on 4 L with the Oxymizer and her O2 saturation dropped to 61% and her heart rate dropped to 41 while becoming cyanotic. It took approximately 10 minutes with an O2 flow rate at 6 L nasal cannula through an Oxymizer for her O2 saturation to reach 90% and her heart rate to improve to the 80s to 90s and her color to change to pink again.   SUBJECTIVE/OVERNIGHT/INTERVAL HX:    No issues overnight. Transient desats today when she took off the O2 Feels better.  OBJECTIVE BP (!) 113/57 (BP Location: Left Arm)   Pulse 93   Temp 98.3 F (36.8 C) (Oral)   Resp 17   Ht _0  (1.549 m)   Wt 283 lb 14.4 oz (128.8 kg)   LMP 05/06/2013   SpO2 92%   BMI 53.64 kg/m   Physical Exam: Gen:  No acute distress HEENT:  EOMI, sclera anicteric Neck:     No masses; no thyromegaly Lungs:    Clear to auscultation bilaterally; normal respiratory effort CV:         Regular rate and rhythm; no murmurs Abd:      + bowel sounds; soft, non-tender; no palpable masses, no distension Ext:    2-3 + edema; adequate peripheral perfusion Skin:      Warm and dry; no rash Neuro: alert and oriented x 3 Psych: normal mood and affect   PULMONARY  Recent Labs Lab 11/04/16 1630 11/06/16 2345  11/07/16 1508 11/07/16 1517 11/07/16 1519 11/07/16 1525 11/07/16 1528 11/07/16 2154  PHART 7.454* 7.369  --   --   --   --   --   --  7.328*  PCO2ART 52.1* 66.6*  --   --   --   --   --   --  68.3*  PO2ART 57.7* 54.5*  --   --   --   --   --   --  61.2*  HCO3  36.2* 37.4*  < > 37.5* 39.3* 38.7* 37.7* 38.6* 34.8*  TCO2  --   --   < > 39 41 41 40 41  --   O2SAT 89.2 83.4  < > 37.0 41.0 39.0 38.0 33.0 86.8  < > = values in this interval not displayed.  CBC  Recent Labs Lab 11/08/16 0454 11/09/16 0240 11/10/16 0304  HGB 10.6* 9.8* 9.0*  HCT 35.7* 32.9* 29.6*  WBC 7.2 5.8 9.3  PLT 207 175 159    COAGULATION  Recent Labs Lab 11/07/16 0046  INR 2.61    CARDIAC  No results for input(s): TROPONINI in the last 168 hours. No results for input(s): PROBNP in the last 168 hours.   CHEMISTRY  Recent Labs Lab 11/06/16 0221 11/07/16 0717 11/08/16 0454 11/09/16 0240 11/10/16 0304  NA 137 135 137 134* 135  K 3.5 3.5 4.0 3.5 3.6  CL 90* 93* 90* 85* 85*  CO2 37* 32 37* 38* 39*  GLUCOSE 113* 91 92 244* 151*  BUN _1 24*  CREATININE 1.14* 1.06* 1.07* 1.12*  1.12*  CALCIUM 9.1 9.1 9.3 9.1 9.0  MG 2.1 1.6* 2.0 1.9 1.8  PHOS 4.5 4.0 4.6 4.4 3.7   Estimated Creatinine Clearance: 61.7 mL/min (by C-G formula based on SCr of 1.12 mg/dL (H)).   LIVER  Recent Labs Lab 11/04/16 1656 11/07/16 0046  AST 32  --   ALT 9*  --   ALKPHOS 140*  --   BILITOT 2.7*  --   PROT 6.4*  --   ALBUMIN 3.3*  --   INR  --  2.61    INFECTIOUS No results for input(s): LATICACIDVEN, PROCALCITON in the last 168 hours.   ENDOCRINE CBG (last 3)   Recent Labs  11/09/16 1946 11/10/16 0734 11/10/16 1152  GLUCAP 192* 141* 145*     IMAGING x48h  - image(s) personally visualized  -   highlighted in bold No results found.   Baseline Testing: ABG 10/06/2016 performed on oxygen > 7.43/53.1/65.0/34.7 PFT's Jan 2018 > FVC 0.88 L, 32% predicted, normal ratio, DLCO 7.18 (35% pred). High Res CT chest 10/06/16 > normal pulmonary parenchyma with the exception of interlobular septal thickening and groundglass worse in the dependent sections of all lobes, there is a moderate size right-sided pleural effusion, pulmonary vascular engorgement. VQ  10/06/16 > No evidence of pulmonary embolism, decreased ventilation and perfusion to the right lower lobe which corresponds to the large effusion on CT Echo Dec 2017 > LVEF 55-60%, PAP 77 Echo 11/06/16 > EF 55-60%, PAP 90 RHC 03/05 > PCWP 63, LVEDP 28, index 2.15.  EVENTS 11/04/16 - admit 11/07/16- Right heart cath  ASSESSMENT and PLAN: 66 year old with acute on chronic hypoxic respiratory failure, diastolic heart failure, pulmonary hypertension, untreated OSA. No evidence of COPD, ILD or thromboemolism. PFTs show restriction with DLCO impairement and no obstruction.  Underwent right heart catheterization which shows severe pulmonary hypertension with elevated LV filling pressures.  RHC- PAP 55, LVEDP 28, Wedge not accurate. PVR 455 (5.7 woods)  No shunt noted  Resp: Acute on chronic hypoxic respiratory failure - multifactorial due to OSA, hypoventilation from restrictive lung disease due to obesity Pulmonary Hypertension - likely multifactorial group 2 (dCHF) group 3 (Suspected OSA, restrictive lung disease from obesity). OSA / OHS. Bilateral pleural effusions, R > L.  Plan: - Continue supplemental O2. Wean as tolerated - Encourage CPAP use at night. Pt has been non compliant.  - Continue diuresis - Follow labs for [facility] workup - Get OOB, mobilize  CVS: Chronic A.fib (on xarelto). Acute on chronic dCHF Marked volume overload  Plan: Cardiology following / managing, appreciate the assistance. Continue diuresis Continue xarelto  Endocrine: Elevated blood sugars while on steroids Hypothyroidism. Repeat TSH is OK  Plan: Continue synthroid. SSI for elevated BS Continue solumedrol 40 mg qd  Best Practice: Code Status:  Full Diet:  Heart healthy. Prophylaxis: SCD's / Heparin.  FAMILY  - Updates: Patient updated daily. - Inter-disciplinary family meet or Palliative Care meeting due by:  Day 7. Current LOS is LOS 6 days  Marshell Garfinkel MD [facility] Pager [phone] If no answer or after 3pm call: [phone] 11/10/2016, 2:00 PM

## 2016-11-10 NOTE — Progress Notes (Signed)
Patient stated she would have nurse call RT when she is ready to go on CPAP for tonight.

## 2016-11-11 LAB — GLUCOSE, CAPILLARY
GLUCOSE-CAPILLARY: 133 mg/dL — AB (ref 65–99)
GLUCOSE-CAPILLARY: 123 mg/dL — AB (ref 65–99)
GLUCOSE-CAPILLARY: 189 mg/dL — AB (ref 65–99)
Glucose-Capillary: 158 mg/dL — ABNORMAL HIGH (ref 65–99)
Glucose-Capillary: 138 mg/dL — ABNORMAL HIGH (ref 65–99)

## 2016-11-11 LAB — BASIC METABOLIC PANEL
ANION GAP: 10 (ref 5–15)
BUN: 27 mg/dL — ABNORMAL HIGH (ref 6–20)
CALCIUM: 9.2 mg/dL (ref 8.9–10.3)
CO2: 44 mmol/L — ABNORMAL HIGH (ref 22–32)
Chloride: 80 mmol/L — ABNORMAL LOW (ref 101–111)
Creatinine, Ser: 1.22 mg/dL — ABNORMAL HIGH (ref 0.44–1.00)
GFR calc Af Amer: 52 mL/min — ABNORMAL LOW (ref >60)
GFR, EST NON AFRICAN AMERICAN: 45 mL/min — AB (ref >60)
GLUCOSE: 153 mg/dL — AB (ref 65–99)
Potassium: 4 mmol/L (ref 3.5–5.1)
Sodium: 134 mmol/L — ABNORMAL LOW (ref 135–145)

## 2016-11-11 LAB — CBC
HCT: 30.9 % — ABNORMAL LOW (ref 36.0–46.0)
Hemoglobin: 9.3 g/dL — ABNORMAL LOW (ref 12.0–15.0)
MCH: 28.5 pg (ref 26.0–34.0)
MCHC: 30.1 g/dL (ref 30.0–36.0)
MCV: 94.8 fL (ref 78.0–100.0)
PLATELETS: 159 10*3/uL (ref 150–400)
RBC: 3.26 MIL/uL — ABNORMAL LOW (ref 3.87–5.11)
RDW: 19.7 % — AB (ref 11.5–15.5)
WBC: 7.6 10*3/uL (ref 4.0–10.5)

## 2016-11-11 LAB — T3, FREE: T3 FREE: 1.7 pg/mL — AB (ref 2.0–4.4)

## 2016-11-11 LAB — MAGNESIUM: Magnesium: 2.1 mg/dL (ref 1.7–2.4)

## 2016-11-11 MED ORDER — PREDNISONE 20 MG PO TABS
40.0000 mg | ORAL_TABLET | Freq: Every day | ORAL | Status: DC
  Administered 2016-11-12: 40 mg via ORAL
  Filled 2016-11-11: qty 4

## 2016-11-11 MED ORDER — ACETAZOLAMIDE 250 MG PO TABS
500.0000 mg | ORAL_TABLET | Freq: Two times a day (BID) | ORAL | Status: DC
  Administered 2016-11-11 – 2016-11-15 (×8): 500 mg via ORAL
  Filled 2016-11-11 (×8): qty 2

## 2016-11-11 MED ORDER — QUETIAPINE FUMARATE 50 MG PO TABS
25.0000 mg | ORAL_TABLET | Freq: Every day | ORAL | Status: DC
  Administered 2016-11-11: 25 mg via ORAL
  Filled 2016-11-11: qty 1

## 2016-11-11 MED ORDER — FUROSEMIDE 10 MG/ML IJ SOLN
120.0000 mg | Freq: Two times a day (BID) | INTRAVENOUS | Status: DC
  Administered 2016-11-11 – 2016-11-13 (×4): 120 mg via INTRAVENOUS
  Filled 2016-11-11 (×5): qty 12

## 2016-11-11 NOTE — Progress Notes (Signed)
MD request foley reinserted d/t aggressive diuresing (patient removed her foley overnight accidentally w/balloon intact). Foley huddle with Loletha Grayer, Clarene Critchley RN, Lavella Lemons RN, & Lasandra Beech. Patient unable to use [facility] d/t mobility/oxygenation issues and unable to call in time for bedpan. Accurate strict  I & O required. Foley inserted w/sterile technique by Nehemiah Settle after pericare performed. Mesh underwear put on patient to protect foley.

## 2016-11-11 NOTE — Progress Notes (Addendum)
Subjective:    Patient ID: [name], female    DOB: [birth year], 66 y.o.   MRN: [id number]  Synopsis: Referred in 2017 to the [facility] for evaluation of chronic respiratory failure with hypoxemia and likely pulmonary hypertension. She has a past medical history significant for stroke, diastolic heart failure, atrial fibrillation. She also has obstructive sleep apnea and is status post U P3 surgery.  She ambulated approximately 100 feet on 4 L with the Oxymizer and her O2 saturation dropped to 61% and her heart rate dropped to 41 while becoming cyanotic. It took approximately 10 minutes with an O2 flow rate at 6 L nasal cannula through an Oxymizer for her O2 saturation to reach 90% and her heart rate to improve to the 80s to 90s and her color to change to pink again.   SUBJECTIVE/OVERNIGHT/INTERVAL HX:    Delirium overnight Pulled out foley's More alert today AM. States that breathing is improved overall.  OBJECTIVE BP 129/73 (BP Location: Left Arm)   Pulse 77   Temp 97.5 F (36.4 C) (Oral)   Resp 19   Ht 5' 1" (1.549 m)   Wt 272 lb 1.6 oz (123.4 kg)   LMP 05/06/2013   SpO2 90%   BMI 51.41 kg/m   Physical Exam: Gen:      No acute distress HEENT:  EOMI, sclera anicteric Neck:     No masses; no thyromegaly Lungs:    Clear to auscultation bilaterally; normal respiratory effort CV:         Regular rate and rhythm; no murmurs Abd:      + bowel sounds; soft, non-tender; no palpable masses, no distension Ext:    1+ edema; adequate peripheral perfusion Skin:      Warm and dry; no rash Neuro: alert and oriented x 3 Psych: normal mood and affect  PULMONARY  Recent Labs Lab 11/04/16 1630 11/06/16 2345  11/07/16 1508 11/07/16 1517 11/07/16 1519 11/07/16 1525 11/07/16 1528 11/07/16 2154  PHART 7.454* 7.369  --   --   --   --   --   --  7.328*  PCO2ART 52.1* 66.6*  --   --   --   --   --   --  68.3*  PO2ART 57.7* 54.5*  --   --   --   --   --   --   61.2*  HCO3 36.2* 37.4*  < > 37.5* 39.3* 38.7* 37.7* 38.6* 34.8*  TCO2  --   --   < > 39 41 41 40 41  --   O2SAT 89.2 83.4  < > 37.0 41.0 39.0 38.0 33.0 86.8  < > = values in this interval not displayed.  CBC  Recent Labs Lab 11/09/16 0240 11/10/16 0304 11/11/16 0206  HGB 9.8* 9.0* 9.3*  HCT 32.9* 29.6* 30.9*  WBC 5.8 9.3 7.6  PLT 175 159 159    COAGULATION  Recent Labs Lab 11/07/16 0046  INR 2.61    CARDIAC  No results for input(s): TROPONINI in the last 168 hours. No results for input(s): PROBNP in the last 168 hours.   CHEMISTRY  Recent Labs Lab 11/06/16 0221 11/07/16 0717 11/08/16 0454 11/09/16 0240 11/10/16 0304 11/11/16 0206  NA 137 135 137 134* 135 134*  K 3.5 3.5 4.0 3.5 3.6 4.0  CL 90* 93* 90* 85* 85* 80*  CO2 37* 32 37* 38* 39* 44*  GLUCOSE 113* 91 92 244* 151* 153*  BUN 15 17  18 20 24* 27*  CREATININE 1.14* 1.06* 1.07* 1.12* 1.12* 1.22*  CALCIUM 9.1 9.1 9.3 9.1 9.0 9.2  MG 2.1 1.6* 2.0 1.9 1.8 2.1  PHOS 4.5 4.0 4.6 4.4 3.7  --    Estimated Creatinine Clearance: 55.1 mL/min (by C-G formula based on SCr of 1.22 mg/dL (H)).   LIVER  Recent Labs Lab 11/04/16 1656 11/07/16 0046  AST 32  --   ALT 9*  --   ALKPHOS 140*  --   BILITOT 2.7*  --   PROT 6.4*  --   ALBUMIN 3.3*  --   INR  --  2.61    INFECTIOUS No results for input(s): LATICACIDVEN, PROCALCITON in the last 168 hours.   ENDOCRINE CBG (last 3)   Recent Labs  11/10/16 1606 11/10/16 2041 11/10/16 2204  GLUCAP 199* 195* 158*     IMAGING x48h  - image(s) personally visualized  -   highlighted in bold No results found.   Baseline Testing: ABG 10/06/2016 performed on oxygen > 7.43/53.1/65.0/34.7 PFT's Jan 2018 > FVC 0.88 L, 32% predicted, normal ratio, DLCO 7.18 (35% pred). High Res CT chest 10/06/16 > normal pulmonary parenchyma with the exception of interlobular septal thickening and groundglass worse in the dependent sections of all lobes, there is a moderate size  right-sided pleural effusion, pulmonary vascular engorgement. VQ 10/06/16 > No evidence of pulmonary embolism, decreased ventilation and perfusion to the right lower lobe which corresponds to the large effusion on CT Echo Dec 2017 > LVEF 55-60%, PAP 77 Echo 11/06/16 > EF 55-60%, PAP 90 RHC 03/05 > PCWP 63, LVEDP 28, index 2.15.  Serologies 11/08/16 ANA, RA, Scl 70- Negative HIV 07/23/15- Negative  EVENTS 11/04/16 - Admit 11/07/16- Right heart cath  ASSESSMENT and PLAN: 66 year old with acute on chronic hypoxic respiratory failure, diastolic heart failure, pulmonary hypertension, untreated OSA. No evidence of COPD, ILD or thromboemolism. PFTs show restriction with DLCO impairement and no obstruction.  Underwent right heart catheterization which shows severe pulmonary hypertension with elevated LV filling pressures.  RHC- PAP 55, LVEDP 28, Wedge not accurate. PVR 455 (5.7 woods)  No shunt noted  Resp: Acute on chronic hypoxic respiratory failure - multifactorial due to OSA, hypoventilation from restrictive lung disease due to obesity Pulmonary Hypertension - likely multifactorial group 2 (dCHF) group 3 (Suspected OSA, restrictive lung disease from obesity). OSA / OHS. Bilateral pleural effusions, R > L.  Plan: - Continue supplemental O2. Wean as tolerated - Encourage CPAP use at night.  - Continue diuresis. Will reduce lasix dose as Cr is slightly higher today Lasix to 120 mg q12 from q8, continue metolazone 5 mg. - Get OOB, mobilize, PT consult  CVS: Chronic A.fib (on xarelto). Acute on chronic dCHF Marked volume overload  Plan: Cardiology following / managing, appreciate the assistance. Continue diuresis Continue xarelto  Endocrine: Elevated blood sugars while on steroids Hypothyroidism. Repeat TSH is OK  Plan: Continue synthroid. SSI for elevated BS Continue steroid taper. Switch solumedrol to prednisone 40 mg  Neuro: Reccurent delirium at night. Likely  sundowning.  Plan: Start seroquel at night.  Best Practice: Code Status:  Full Diet:  Heart healthy. Prophylaxis: SCD's / Heparin.  FAMILY  - Updates: Patient updated daily. - Inter-disciplinary family meet or Palliative Care meeting due by:  Day 7.   Marshell Garfinkel MD [facility] Pager [phone] If no answer or after 3pm call: [phone] 11/11/2016, 9:32 AM

## 2016-11-11 NOTE — Progress Notes (Addendum)
Patient ID: [name], female   DOB: Dec 19, 1950, 66 y.o.   MRN: [id number] P  SUBJECTIVE: I/Os very negative, weight down.       She denies dyspnea in bed but remains on 5L [place].  She had significant desaturation when trying to get out of bed yesterday.   Delirium last night, pulled out foley.  Clear this morning.   RHC 11/07/16 TPG 50-63m Hg PAP mean: 55 mmHg PCWP: 63 mmHg (not accurate).  LVEDP: 28.  PVR using LVEDP and mean PA 5.7 WU.  Cardiac output/index by Fick: 4.74/2.15. RV pressures/EDP: 97/13/21 mmHg Shunt run showed no left to right shunt.   Scheduled Meds: . acetaZOLAMIDE  500 mg Oral Daily  . allopurinol  200 mg Oral Daily  . atenolol  50 mg Oral Daily   And  . atenolol  25 mg Oral QHS  . furosemide  120 mg Intravenous Q8H  . insulin aspart  0-15 Units Subcutaneous TID WC  . insulin aspart  0-5 Units Subcutaneous QHS  . levalbuterol  0.63 mg Nebulization QID  . levothyroxine  75 mcg Oral QAC breakfast  . methylPREDNISolone (SOLU-MEDROL) injection  40 mg Intravenous Daily  . metolazone  5 mg Oral BID  . potassium chloride  60 mEq Oral BID  . QUEtiapine  25 mg Oral QHS  . rivaroxaban  20 mg Oral Q supper  . sodium chloride flush  3 mL Intravenous Q12H   Continuous Infusions: PRN Meds:.sodium chloride, sodium chloride, levalbuterol, methocarbamol, ondansetron (ZOFRAN) IV, sodium chloride flush, traMADol    Vitals:   11/11/16 0100 11/11/16 0200 11/11/16 0400 11/11/16 0530  BP:  116/74 116/63   Pulse: (!) 44 75 67 83  Resp: _0 Temp:   97.6 F (36.4 C)   TempSrc:   Oral   SpO2: 91% 95% (!) 88% 92%  Weight:    272 lb 1.6 oz (123.4 kg)  Height:        Intake/Output Summary (Last 24 hours) at 11/11/16 0703 Last data filed at 11/11/16 01027 Gross per 24 hour  Intake             1529 ml  Output             5950 ml  Net            -4421 ml    LABS: Basic Metabolic Panel:  Recent Labs  11/09/16 0240 11/10/16 0304 11/11/16 0206  NA 134*  135 134*  K 3.5 3.6 4.0  CL 85* 85* 80*  CO2 38* 39* 44*  GLUCOSE 244* 151* 153*  BUN 20 24* 27*  CREATININE 1.12* 1.12* 1.22*  CALCIUM 9.1 9.0 9.2  MG 1.9 1.8 2.1  PHOS 4.4 3.7  --    Liver Function Tests: No results for input(s): AST, ALT, ALKPHOS, BILITOT, PROT, ALBUMIN in the last 72 hours. No results for input(s): LIPASE, AMYLASE in the last 72 hours. CBC:  Recent Labs  11/10/16 0304 11/11/16 0206  WBC 9.3 7.6  HGB 9.0* 9.3*  HCT 29.6* 30.9*  MCV 95.2 94.8  PLT 159 159   Cardiac Enzymes: No results for input(s): CKTOTAL, CKMB, CKMBINDEX, TROPONINI in the last 72 hours. BNP: Invalid input(s): POCBNP D-Dimer: No results for input(s): DDIMER in the last 72 hours. Hemoglobin A1C: No results for input(s): HGBA1C in the last 72 hours. Fasting Lipid Panel: No results for input(s): CHOL, HDL, LDLCALC, TRIG, CHOLHDL, LDLDIRECT in the last 72 hours. Thyroid Function  Tests:  Recent Labs  11/10/16 0850  TSH 1.445   Anemia Panel: No results for input(s): VITAMINB12, FOLATE, FERRITIN, TIBC, IRON, RETICCTPCT in the last 72 hours.  RADIOLOGY: Dg Chest Port 1 View  Result Date: 11/07/2016 CLINICAL DATA:  Hypoxemia EXAM: PORTABLE CHEST 1 VIEW COMPARISON:  11/04/2016 CXR, chest CT 10/06/2016 FINDINGS: Low lung volumes with crowding of interstitial lung markings. Bilateral pleural effusions right greater than left. Mild vascular congestion is noted though decreased since prior exam. Bilateral shoulder arthroplasties are present. No acute osseous abnormality. IMPRESSION: Cardiomegaly with bilateral pleural effusions right slightly greater than left. Mild vascular congestion. Electronically Signed   By: Ashley Royalty M.D.   On: 11/07/2016 00:10   Dg Chest Port 1 View  Result Date: 11/04/2016 CLINICAL DATA:  66 year old female with chronic respiratory failure, likely pulmonary hypertension. Admitted today for Acute on chronic respiratory failure, possibly acute decompensated heart  failure. Initial encounter. EXAM: PORTABLE CHEST 1 VIEW COMPARISON:  Chest radiographs 10/06/2016 and earlier. High-resolution chest CT 10/06/2016. FINDINGS: Portable AP upright view at 1632 hours. Stable cardiomegaly and mediastinal contours. Chronic retrocardiac hypoventilation. Veiling opacity at the right lung base has not significantly changed and there was a moderate size right layering pleural effusion on the 10/06/2016 CT. Associated right perihilar atelectasis. No superimposed pneumothorax. No overt pulmonary edema. IMPRESSION: Suspect stable chest since the chest CT on 10/06/2016; moderate size right pleural effusion and bibasilar atelectasis in the setting of chronic cardiomegaly. Electronically Signed   By: Genevie Ann M.D.   On: 11/04/2016 16:55    PHYSICAL EXAM General: NAD, obese Neck: JVP 14+ cm, no thyromegaly or thyroid nodule.  Lungs: Clear to auscultation bilaterally with normal respiratory effort. CV: Nondisplaced PMI.  Heart regular S1/S2, no S3/S4, no murmur.  1+ ankle edema.   Abdomen: Soft, nontender, no hepatosplenomegaly, no distention.  Neurologic: Alert and oriented x 3.  Psych: Normal affect. Extremities: No clubbing or cyanosis.   TELEMETRY: Reviewed telemetry pt in atrial fibrillation  ASSESSMENT AND PLAN: 66 yo with morbid obesity, OHS/OSA, chronic atrial fibrillation with prior CVA was admitted with acute on chronic diastolic CHF.  1. Acute on chronic diastolic CHF with severe pulmonary hypertension and prominent RV failure: She is markedly volume overloaded.  She diuresed well yesterday but still needs diuresis.  CO2 44 today.  Oxygen saturation still drops significantly with activity.  - Continue Lasix 120 mg IV every 8 hrs + metolazone 5 mg bid. Replace K. Creatinine stable.   - Increase acetazolamide to 500 mg bid with elevated CO2.    - If diuresis slows, will need to consider PICC placement for CVP/co-ox and use of milrinone to support RV.  2. Atrial  fibrillation: Chronic, she is on Xarelto + atenolol, continue for now.  3. OHS/OSA: Followed by Dr. [place] Bells.  Suspected severe OSA but has not yet had sleep study.  On oxygen at home.  4. 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  PVR 5.7 WU.  Suspect group 2 (elevated LA pressure) and group 3 (OHS/OSA) PH.  However, cannot rule out group 1 component.  She had V/Q scan that was not suggestive of chronic PE and high resolution chest CT that was not suggestive of ILD.   - ANA, RF, anti-SCL70 all negative.  - After she has been effectively diuresed with decreased PCWP, would give a trial of sildenafil.  - She will need oxygen during the day and CPAP at night (needs sleep study).  5. Mobilize with  PT.   Loralie Champagne 11/11/2016 7:03 AM

## 2016-11-11 NOTE — Evaluation (Signed)
Physical Therapy Evaluation Patient Details Name: [name] MRN: [id number] DOB: 1951-01-30 Today's Date: 11/11/2016   History of Present Illness  Patient is a 66 yo female admitted 11/04/16 with acute on chronic resp failure with hypoxemia.  Patient had RHC showing severe pulmonary HTN.   PMH:  stroke, diastolic heart failure, atrial fibrillation. She also has obstructive sleep apnea and is status post U P3 surgery, s/p Lt reverse TSA 04/29/16.  Clinical Impression  Patient presents with problems listed below.  Will benefit from acute PT to maximize functional mobility prior to discharge.  Recommend ST-SNF for continued therapy with goal to return to Mod I functional level to return home safely.    Follow Up Recommendations SNF;Supervision for mobility/OOB    Equipment Recommendations  None recommended by PT    Recommendations for Other Services       Precautions / Restrictions Precautions Precautions: Fall Precaution Comments: knees buckle due to weakness Restrictions Weight Bearing Restrictions: No      Mobility  Bed Mobility               General bed mobility comments: Patient in chair as PT entered room  Transfers Overall transfer level: Needs assistance Equipment used: Rolling walker (2 wheeled) Transfers: Sit to/from Stand Sit to Stand: Mod assist;+2 physical assistance         General transfer comment: Verbal cues for hand placement and to scoot to edge of chair.  Required +2 assist to power up to standing.  Assist for balance/safety once upright.  Stood x3.  Ambulation/Gait Ambulation/Gait assistance: Min assist;+2 physical assistance;+2 safety/equipment Ambulation Distance (Feet): 18 Feet (3' forward and backward x 3 with seated rest breaks) Assistive device: Rolling walker (2 wheeled) Gait Pattern/deviations: Step-through pattern;Decreased step length - right;Decreased step length - left;Decreased stride length;Shuffle;Trunk flexed Gait velocity:  decreased Gait velocity interpretation: Below normal speed for age/gender General Gait Details: On first attempt, patient able to remain upright with steps.  During second and third attempt, patient with more flexed trunk (patient reports due to back pain - chronic).  Patient with short step lengths.  Noted Lt knee buckling at times.  Chair close to patient for safety.  O2 sats fluctuating during mobility, from mid-80's to 98%.  Patient reports dyspnea during mobility.  Stairs            Wheelchair Mobility    Modified Rankin (Stroke Patients Only)       Balance Overall balance assessment: Needs assistance         Standing balance support: Bilateral upper extremity supported Standing balance-Leahy Scale: Poor                               Pertinent Vitals/Pain Pain Assessment: 0-10 Pain Score: 2  Pain Location: back Pain Descriptors / Indicators: Aching;Sore Pain Intervention(s): Monitored during session;Repositioned    Home Living Family/patient expects to be discharged to:: Private residence Living Arrangements: Alone Available Help at Discharge: Family;Available PRN/intermittently (sister) Type of Home: House Home Access: Stairs to enter Entrance Stairs-Rails: Right Entrance Stairs-Number of Steps: 2 Home Layout: One level Home Equipment: Walker - 4 wheels;Cane - single point;Shower seat - built in;Bedside commode      Prior Function Level of Independence: Independent with assistive device(s)         Comments: Uses cane/RW since shoulder surgery     Hand Dominance   Dominant Hand: Left    Extremity/Trunk Assessment  Upper Extremity Assessment Upper Extremity Assessment: Overall WFL for tasks assessed (Recent Lt shoulder surgery)    Lower Extremity Assessment Lower Extremity Assessment: Generalized weakness       Communication   Communication: No difficulties  Cognition Arousal/Alertness: Awake/alert Behavior During Therapy:  WFL for tasks assessed/performed Overall Cognitive Status: Within Functional Limits for tasks assessed                 General Comments: Very talkative    General Comments      Exercises     Assessment/Plan    PT Assessment Patient needs continued PT services  PT Problem List Decreased strength;Decreased activity tolerance;Decreased balance;Decreased mobility;Decreased knowledge of use of DME;Cardiopulmonary status limiting activity;Obesity;Pain       PT Treatment Interventions DME instruction;Gait training;Functional mobility training;Therapeutic activities;Therapeutic exercise;Patient/family education    PT Goals (Current goals can be found in the Care Plan section)  Acute Rehab PT Goals Patient Stated Goal: To go to rehab to get stronger PT Goal Formulation: With patient Time For Goal Achievement: 11/25/16 Potential to Achieve Goals: Good    Frequency Min 3X/week   Barriers to discharge Decreased caregiver support Patient lives alone    Co-evaluation               End of Session Equipment Utilized During Treatment: Gait belt;Oxygen Activity Tolerance: Patient limited by fatigue (Limited by decrease in O2 sats) Patient left: in chair;with call bell/phone within reach Nurse Communication: Mobility status PT Visit Diagnosis: Muscle weakness (generalized) (M62.81);Difficulty in walking, not elsewhere classified (R26.2);Pain Pain - part of body:  (Back)         Time: 0258-5277 PT Time Calculation (min) (ACUTE ONLY): 27 min   Charges:   PT Evaluation $PT Eval Moderate Complexity: 1 Procedure PT Treatments $Gait Training: 8-22 mins   PT G Codes:         Despina Pole 12-02-2016, 3:36 PM Carita Pian. Sanjuana Kava, [place] Pager [phone]

## 2016-11-11 NOTE — Progress Notes (Signed)
Patient refused CPAP for tonight.  RT informed patient to have RN call if she changes her mind. RT will continue to monitor as needed.

## 2016-11-12 LAB — CBC
HCT: 33.2 % — ABNORMAL LOW (ref 36.0–46.0)
Hemoglobin: 9.9 g/dL — ABNORMAL LOW (ref 12.0–15.0)
MCH: 28.1 pg (ref 26.0–34.0)
MCHC: 29.8 g/dL — ABNORMAL LOW (ref 30.0–36.0)
MCV: 94.3 fL (ref 78.0–100.0)
PLATELETS: 142 10*3/uL — AB (ref 150–400)
RBC: 3.52 MIL/uL — ABNORMAL LOW (ref 3.87–5.11)
RDW: 19.4 % — ABNORMAL HIGH (ref 11.5–15.5)
WBC: 6.8 10*3/uL (ref 4.0–10.5)

## 2016-11-12 LAB — BASIC METABOLIC PANEL
ANION GAP: 12 (ref 5–15)
BUN: 37 mg/dL — ABNORMAL HIGH (ref 6–20)
CO2: 45 mmol/L — ABNORMAL HIGH (ref 22–32)
Calcium: 9 mg/dL (ref 8.9–10.3)
Chloride: 78 mmol/L — ABNORMAL LOW (ref 101–111)
Creatinine, Ser: 1.19 mg/dL — ABNORMAL HIGH (ref 0.44–1.00)
GFR, EST AFRICAN AMERICAN: 54 mL/min — AB (ref >60)
GFR, EST NON AFRICAN AMERICAN: 46 mL/min — AB (ref >60)
GLUCOSE: 148 mg/dL — AB (ref 65–99)
Potassium: 3.5 mmol/L (ref 3.5–5.1)
SODIUM: 135 mmol/L (ref 135–145)

## 2016-11-12 LAB — GLUCOSE, CAPILLARY
GLUCOSE-CAPILLARY: 126 mg/dL — AB (ref 65–99)
Glucose-Capillary: 146 mg/dL — ABNORMAL HIGH (ref 65–99)
Glucose-Capillary: 123 mg/dL — ABNORMAL HIGH (ref 65–99)
Glucose-Capillary: 135 mg/dL — ABNORMAL HIGH (ref 65–99)

## 2016-11-12 LAB — MAGNESIUM: MAGNESIUM: 2 mg/dL (ref 1.7–2.4)

## 2016-11-12 LAB — PHOSPHORUS: PHOSPHORUS: 4.4 mg/dL (ref 2.5–4.6)

## 2016-11-12 MED ORDER — LEVALBUTEROL HCL 0.63 MG/3ML IN NEBU: 0.6300 mg | INHALATION_SOLUTION | RESPIRATORY_TRACT | Status: DC | PRN

## 2016-11-12 NOTE — Progress Notes (Signed)
RT placed patient on bipap. Patient is resting comfortably with no respiratory distress noted at this time. RT will continue to monitor as needed.

## 2016-11-12 NOTE — Progress Notes (Signed)
Pt unable to arouse without sternal rub, to which patient screamed in baby tones/whines; pt unable to open her eyes; confused; crying and whining; peri care and foley care completed w/patient dependent for all turning and care.  Pt unable to take AM meds - unsafe to administer PO as patient couldn't even sip water out of a straw.  Request AM ABGs for tomorrow to monitor CO2 retention overnight.  Will continue to monitor. Pt continues to refuse to wear BiPAP or CPAP at night.

## 2016-11-12 NOTE — Progress Notes (Signed)
Patient ID: ANNELISE MCCOY, female   DOB: [birth year], 66 y.o.   MRN: [id number] P  SUBJECTIVE:   Ws on BIPAP this am but now doing well on 4L [place]. Breathing fine. Was up all afternoon talking to family. But is a bit lethargic now (won't open eyes) but answers all my questions very clearly and even with some wit.   Denies dyspnea. Out 7L overnight. Weight down another 9 pounds. (21 pounds total) .     RHC 11/07/16 TPG 50-34m Hg PAP mean: 55 mmHg PCWP: 63 mmHg (not accurate).  LVEDP: 28.  PVR using LVEDP and mean PA 5.7 WU.  Cardiac output/index by Fick: 4.74/2.15. RV pressures/EDP: 97/13/21 mmHg Shunt run showed no left to right shunt.   Scheduled Meds: . acetaZOLAMIDE  500 mg Oral BID  . allopurinol  200 mg Oral Daily  . atenolol  50 mg Oral Daily   And  . atenolol  25 mg Oral QHS  . furosemide  120 mg Intravenous Q12H  . insulin aspart  0-15 Units Subcutaneous TID WC  . insulin aspart  0-5 Units Subcutaneous QHS  . levothyroxine  75 mcg Oral QAC breakfast  . metolazone  5 mg Oral BID  . potassium chloride  60 mEq Oral BID  . rivaroxaban  20 mg Oral Q supper  . sodium chloride flush  3 mL Intravenous Q12H   Continuous Infusions: PRN Meds:.sodium chloride, levalbuterol, methocarbamol, ondansetron (ZOFRAN) IV, sodium chloride flush, traMADol    Vitals:   11/12/16 1009 11/12/16 1100 11/12/16 1125 11/12/16 1506  BP: 114/65 114/65 114/65 (!) 98/46  Pulse: 75 68 69 84  Resp: 15 19 (!) 21 20  Temp:  98.2 F (36.8 C)  98.6 F (37 C)  TempSrc:  Axillary  Axillary  SpO2: 97% 97% 95% 97%  Weight:      Height:        Intake/Output Summary (Last 24 hours) at 11/12/16 1828 Last data filed at 11/12/16 1756  Gross per 24 hour  Intake            441.2 ml  Output             5426 ml  Net          -4984.8 ml    LABS: Basic Metabolic Panel:  Recent Labs  11/10/16 0304 11/11/16 0206 11/12/16 0342  NA 135 134* 135  K 3.6 4.0 3.5  CL 85* 80* 78*  CO2 39* 44* 45*  GLUCOSE  151* 153* 148*  BUN 24* 27* 37*  CREATININE 1.12* 1.22* 1.19*  CALCIUM 9.0 9.2 9.0  MG 1.8 2.1 2.0  PHOS 3.7  --  4.4   Liver Function Tests: No results for input(s): AST, ALT, ALKPHOS, BILITOT, PROT, ALBUMIN in the last 72 hours. No results for input(s): LIPASE, AMYLASE in the last 72 hours. CBC:  Recent Labs  11/11/16 0206 11/12/16 0342  WBC 7.6 6.8  HGB 9.3* 9.9*  HCT 30.9* 33.2*  MCV 94.8 94.3  PLT 159 142*   Cardiac Enzymes: No results for input(s): CKTOTAL, CKMB, CKMBINDEX, TROPONINI in the last 72 hours. BNP: Invalid input(s): POCBNP D-Dimer: No results for input(s): DDIMER in the last 72 hours. Hemoglobin A1C: No results for input(s): HGBA1C in the last 72 hours. Fasting Lipid Panel: No results for input(s): CHOL, HDL, LDLCALC, TRIG, CHOLHDL, LDLDIRECT in the last 72 hours. Thyroid Function Tests:  Recent Labs  11/10/16 0304 11/10/16 0850  TSH  --  1.445  T3FREE  1.7*  --    Anemia Panel: No results for input(s): VITAMINB12, FOLATE, FERRITIN, TIBC, IRON, RETICCTPCT in the last 72 hours.  RADIOLOGY: Dg Chest Port 1 View  Result Date: 11/07/2016 CLINICAL DATA:  Hypoxemia EXAM: PORTABLE CHEST 1 VIEW COMPARISON:  11/04/2016 CXR, chest CT 10/06/2016 FINDINGS: Low lung volumes with crowding of interstitial lung markings. Bilateral pleural effusions right greater than left. Mild vascular congestion is noted though decreased since prior exam. Bilateral shoulder arthroplasties are present. No acute osseous abnormality. IMPRESSION: Cardiomegaly with bilateral pleural effusions right slightly greater than left. Mild vascular congestion. Electronically Signed   By: Ashley Royalty M.D.   On: 11/07/2016 00:10   Dg Chest Port 1 View  Result Date: 11/04/2016 CLINICAL DATA:  66 year old female with chronic respiratory failure, likely pulmonary hypertension. Admitted today for Acute on chronic respiratory failure, possibly acute decompensated heart failure. Initial encounter. EXAM:  PORTABLE CHEST 1 VIEW COMPARISON:  Chest radiographs 10/06/2016 and earlier. High-resolution chest CT 10/06/2016. FINDINGS: Portable AP upright view at 1632 hours. Stable cardiomegaly and mediastinal contours. Chronic retrocardiac hypoventilation. Veiling opacity at the right lung base has not significantly changed and there was a moderate size right layering pleural effusion on the 10/06/2016 CT. Associated right perihilar atelectasis. No superimposed pneumothorax. No overt pulmonary edema. IMPRESSION: Suspect stable chest since the chest CT on 10/06/2016; moderate size right pleural effusion and bibasilar atelectasis in the setting of chronic cardiomegaly. Electronically Signed   By: Genevie Ann M.D.   On: 11/04/2016 16:55    PHYSICAL EXAM General: NAD, obese. Lying in bed with eys closed but awake Neck: JVP hard to see. Probably 8-9 cm, no thyromegaly or thyroid nodule.  Lungs: Clear to auscultation bilaterally with normal respiratory effort. CV: Nondisplaced PMI.  Heart regular S1/S2, no S3/S4, no murmur.  Trace ankle edema.   Abdomen: Obese Soft, nontender, no hepatosplenomegaly, no distention.  Neurologic: Alert and oriented x 3.  Psych: eyes closed but answers questions quickly and appropriately  Extremities: No clubbing or cyanosis.   TELEMETRY: Personally reviewed telemetry pt in atrial fibrillation rate 60-80   ASSESSMENT AND PLAN: 66 yo with morbid obesity, OHS/OSA, chronic atrial fibrillation with prior CVA was admitted with acute on chronic diastolic CHF.  1. Acute on chronic diastolic CHF with severe pulmonary hypertension and prominent RV failure:  - She continues to diurese very well. Weight down 21 pounds. Renal function stable  Will continue IV lasix, metolazone and diamox one more day.  - Replace K.  2. Acute on chronic respiratory failure with OHS/OSA:  - followed by Dr. Lake Bells.  Suspected severe OSA but has not yet had sleep study.  On oxygen at home.  - needed BIPAP this am.  Now off - If remains lethargic will check ABG for pCO2 3. Atrial fibrillation:  - Chronic, rate controlled she is on Xarelto + atenolol, continue for now.  4. 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  PVR 5.7 WU.  Suspect group 2 (elevated LA pressure) and group 3 (OHS/OSA) PH.  However, cannot rule out group 1 component.  She had V/Q scan that was not suggestive of chronic PE and high resolution chest CT that was not suggestive of ILD.   - ANA, RF, anti-SCL70 all negative.  - After she has been effectively diuresed with decreased PCWP, can cosnider a trial of sildenafil.  - She will need oxygen during the day and CPAP at night (needs sleep study).  5. Mobilize with PT.  Glori Bickers  MD 11/12/2016 6:28 PM

## 2016-11-12 NOTE — Progress Notes (Signed)
PCCM Progress Note  Admission date: 11/04/2016  CC: Dyspnea  HPI: 66 yo female with progressive dyspnea, cough from acute on chronic diastolic CHF, A fib, OSA/OHS, pulmonary HTN.  Subjective: Lethargic this AM.  Vital signs: BP 109/66 (BP Location: Right Arm)   Pulse 72   Temp 98.9 F (37.2 C) (Oral)   Resp 12   Ht _0  (1.549 m)   Wt 263 lb 1.6 oz (119.3 kg)   LMP 05/06/2013   SpO2 95%   BMI 49.71 kg/m   Intake/output: I/O last 3 completed shifts: In: 1499 [P.O.:1247; I.V.:66; IV Piggyback:186] Out: 57017 [Urine:10500; Stool:1]  General: lethargic Neuro: intermittent myoclonic jerks HEENT: pupils reactive Cardiac: irregular Chest: decreased BS, no wheeze Abd: soft, non tender Ext: 1+ edema Skin: venous stasis changes   CMP Latest Ref Rng & Units 11/12/2016 11/11/2016 11/10/2016  Glucose 65 - 99 mg/dL 148(H) 153(H) 151(H)  BUN 6 - 20 mg/dL 37(H) 27(H) 24(H)  Creatinine 0.44 - 1.00 mg/dL 1.19(H) 1.22(H) 1.12(H)  Sodium 135 - 145 mmol/L 135 134(L) 135  Potassium 3.5 - 5.1 mmol/L 3.5 4.0 3.6  Chloride 101 - 111 mmol/L 78(L) 80(L) 85(L)  CO2 22 - 32 mmol/L 45(H) 44(H) 39(H)  Calcium 8.9 - 10.3 mg/dL 9.0 9.2 9.0  Total Protein 6.5 - 8.1 g/dL - - -  Total Bilirubin 0.3 - 1.2 mg/dL - - -  Alkaline Phos 38 - 126 U/L - - -  AST 15 - 41 U/L - - -  ALT 14 - 54 U/L - - -     CBC Latest Ref Rng & Units 11/12/2016 11/11/2016 11/10/2016  WBC 4.0 - 10.5 K/uL 6.8 7.6 9.3  Hemoglobin 12.0 - 15.0 g/dL 9.9(L) 9.3(L) 9.0(L)  Hematocrit 36.0 - 46.0 % 33.2(L) 30.9(L) 29.6(L)  Platelets 150 - 400 K/uL 142(L) 159 159     ABG    Component Value Date/Time   PHART 7.328 (L) 11/07/2016 2154   PCO2ART 68.3 (HH) 11/07/2016 2154   PO2ART 61.2 (L) 11/07/2016 2154   HCO3 34.8 (H) 11/07/2016 2154   TCO2 41 11/07/2016 1528   O2SAT 86.8 11/07/2016 2154     CBG (last 3)   Recent Labs  11/11/16 1628 11/11/16 2126 11/12/16 0737  GLUCAP 138* 189* 146*     Imaging: No results  found.   Studies: ABG 10/06/2016 performed on oxygen > 7.43/53.1/65.0/34.7 PFT's Jan 2018 > FVC 0.88 L, 32% predicted, normal ratio, DLCO 7.18 (35% pred). High Res CT chest 10/06/16 > normal pulmonary parenchyma with the exception of interlobular septal thickening and groundglass worse in the dependent sections of all lobes, there is a moderate size right-sided pleural effusion, pulmonary vascular engorgement. VQ 10/06/16 > No evidence of pulmonary embolism, decreased ventilation and perfusion to the right lower lobe which corresponds to the large effusion on CT Echo Dec 2017 > LVEF 55-60%, PAP 77 Echo 11/06/16 > EF 55-60%, PAP 90 RHC 03/05 > PCWP 63, LVEDP 28, index 2.15.  Serologies 11/08/16 ANA, RA, Scl 70- Negative HIV 07/23/15- Negative  Assessment/plan:  Acute on chronic hypoxic/hypercapnic respiratory failure. OSA/OHS with morbid obesity. - oxygen to keep SpO2 90 to 95% - Bipap qhs and prn - change to prn BDs - d/c prednisone  Acute on chronic diastolic CHF. A fib. - per cardiology  Hx of hypothyroidism, gout. - synthroid, allopurinol  Acute encephalopathy. - likely from hypercapnia - d/c seroquel  DVT prophylaxis - xarelto SUP - Not indicated Nutrition - Carb modified diet Goals of care -  Full code   Time spent 36 minutes  Chesley Mires, MD [place] 11/12/2016, 9:53 AM Pager:  [phone] After 3pm call: [phone]

## 2016-11-12 NOTE — Procedures (Signed)
Pt placed on Bipap at this time per MD order, pt tolerating well, RT will monitor

## 2016-11-12 NOTE — Progress Notes (Signed)
0700 Read PT's eval from yesterday. We will continue to follow their progress with pt and began seeing when appropriate. Graylon Good RN BSN 11/12/2016 7:02 AM

## 2016-11-13 ENCOUNTER — Encounter (HOSPITAL_BASED_OUTPATIENT_CLINIC_OR_DEPARTMENT_OTHER): Payer: PPO

## 2016-11-13 LAB — BASIC METABOLIC PANEL
ANION GAP: 9 (ref 5–15)
BUN: 41 mg/dL — ABNORMAL HIGH (ref 6–20)
CALCIUM: 9.1 mg/dL (ref 8.9–10.3)
CO2: 48 mmol/L — ABNORMAL HIGH (ref 22–32)
Chloride: 80 mmol/L — ABNORMAL LOW (ref 101–111)
Creatinine, Ser: 1.21 mg/dL — ABNORMAL HIGH (ref 0.44–1.00)
GFR, EST AFRICAN AMERICAN: 52 mL/min — AB (ref >60)
GFR, EST NON AFRICAN AMERICAN: 45 mL/min — AB (ref >60)
GLUCOSE: 103 mg/dL — AB (ref 65–99)
POTASSIUM: 3.4 mmol/L — AB (ref 3.5–5.1)
SODIUM: 137 mmol/L (ref 135–145)

## 2016-11-13 LAB — CBC
HCT: 33.6 % — ABNORMAL LOW (ref 36.0–46.0)
Hemoglobin: 10 g/dL — ABNORMAL LOW (ref 12.0–15.0)
MCH: 28.5 pg (ref 26.0–34.0)
MCHC: 29.8 g/dL — ABNORMAL LOW (ref 30.0–36.0)
MCV: 95.7 fL (ref 78.0–100.0)
PLATELETS: 145 10*3/uL — AB (ref 150–400)
RBC: 3.51 MIL/uL — AB (ref 3.87–5.11)
RDW: 19.2 % — AB (ref 11.5–15.5)
WBC: 8.6 10*3/uL (ref 4.0–10.5)

## 2016-11-13 LAB — POCT I-STAT 3, ART BLOOD GAS (G3+)
ACID-BASE EXCESS: 25 mmol/L — AB (ref 0.0–2.0)
BICARBONATE: 55.2 mmol/L — AB (ref 20.0–28.0)
O2 Saturation: 96 %
PH ART: 7.433 (ref 7.350–7.450)
TCO2: >50 mmol/L (ref 0–100)
pCO2 arterial: 82.1 mmHg (ref 32.0–48.0)
pO2, Arterial: 88 mmHg (ref 83.0–108.0)

## 2016-11-13 LAB — GLUCOSE, CAPILLARY
GLUCOSE-CAPILLARY: 127 mg/dL — AB (ref 65–99)
GLUCOSE-CAPILLARY: 136 mg/dL — AB (ref 65–99)
Glucose-Capillary: 98 mg/dL (ref 65–99)
Glucose-Capillary: 104 mg/dL — ABNORMAL HIGH (ref 65–99)

## 2016-11-13 MED ORDER — SPIRONOLACTONE 25 MG PO TABS
12.5000 mg | ORAL_TABLET | Freq: Every day | ORAL | Status: DC
  Administered 2016-11-13 – 2016-11-15 (×3): 12.5 mg via ORAL
  Filled 2016-11-13 (×3): qty 1

## 2016-11-13 MED ORDER — TORSEMIDE 20 MG PO TABS
40.0000 mg | ORAL_TABLET | Freq: Two times a day (BID) | ORAL | Status: DC
  Administered 2016-11-13 – 2016-11-14 (×2): 40 mg via ORAL
  Filled 2016-11-13 (×2): qty 2

## 2016-11-13 NOTE — Progress Notes (Signed)
Patient ID: LAVELLE BERLAND, female   DOB: August 09, 1951, 66 y.o.   MRN: [id number] P  SUBJECTIVE:   Used BiPAP last night. Now of. Says she doesn't like it.   Denies SOB. On IV lasix 120 bid, acetazolamide, and metolazone 5 bid.  I/Os -5.8L but weight unchanged. (Down 21 pounds total) Renal function stable.   ABG 7.43/82/88/96% on 4L    RHC 11/07/16 TPG 50-31m Hg PAP mean: 55 mmHg PCWP: 63 mmHg (not accurate).  LVEDP: 28.  PVR using LVEDP and mean PA 5.7 WU.  Cardiac output/index by Fick: 4.74/2.15. RV pressures/EDP: 97/13/21 mmHg Shunt run showed no left to right shunt.   Scheduled Meds: . acetaZOLAMIDE  500 mg Oral BID  . allopurinol  200 mg Oral Daily  . atenolol  50 mg Oral Daily   And  . atenolol  25 mg Oral QHS  . furosemide  120 mg Intravenous Q12H  . insulin aspart  0-15 Units Subcutaneous TID WC  . insulin aspart  0-5 Units Subcutaneous QHS  . levothyroxine  75 mcg Oral QAC breakfast  . metolazone  5 mg Oral BID  . potassium chloride  60 mEq Oral BID  . rivaroxaban  20 mg Oral Q supper  . sodium chloride flush  3 mL Intravenous Q12H   Continuous Infusions: PRN Meds:.sodium chloride, levalbuterol, methocarbamol, ondansetron (ZOFRAN) IV, sodium chloride flush, traMADol    Vitals:   11/13/16 0812 11/13/16 0952 11/13/16 1000 11/13/16 1200  BP:   (!) 98/57 (!) 101/57  Pulse:   85 88  Resp:   18 16  Temp:    98 F (36.7 C)  TempSrc:    Oral  SpO2: 97% 99% 99% 92%  Weight:      Height:        Intake/Output Summary (Last 24 hours) at 11/13/16 1527 Last data filed at 11/13/16 1400  Gross per 24 hour  Intake              870 ml  Output             6725 ml  Net            -5855 ml    LABS: Basic Metabolic Panel:  Recent Labs  11/11/16 0206 11/12/16 0342 11/13/16 0305  NA 134* 135 137  K 4.0 3.5 3.4*  CL 80* 78* 80*  CO2 44* 45* 48*  GLUCOSE 153* 148* 103*  BUN 27* 37* 41*  CREATININE 1.22* 1.19* 1.21*  CALCIUM 9.2 9.0 9.1  MG 2.1 2.0  --   PHOS   --  4.4  --    Liver Function Tests: No results for input(s): AST, ALT, ALKPHOS, BILITOT, PROT, ALBUMIN in the last 72 hours. No results for input(s): LIPASE, AMYLASE in the last 72 hours. CBC:  Recent Labs  11/12/16 0342 11/13/16 0305  WBC 6.8 8.6  HGB 9.9* 10.0*  HCT 33.2* 33.6*  MCV 94.3 95.7  PLT 142* 145*   Cardiac Enzymes: No results for input(s): CKTOTAL, CKMB, CKMBINDEX, TROPONINI in the last 72 hours. BNP: Invalid input(s): POCBNP D-Dimer: No results for input(s): DDIMER in the last 72 hours. Hemoglobin A1C: No results for input(s): HGBA1C in the last 72 hours. Fasting Lipid Panel: No results for input(s): CHOL, HDL, LDLCALC, TRIG, CHOLHDL, LDLDIRECT in the last 72 hours. Thyroid Function Tests: No results for input(s): TSH, T4TOTAL, T3FREE, THYROIDAB in the last 72 hours.  Invalid input(s): FREET3 Anemia Panel: No results for input(s): VITAMINB12, FOLATE, FERRITIN,  TIBC, IRON, RETICCTPCT in the last 72 hours.  RADIOLOGY: Dg Chest Port 1 View  Result Date: 11/07/2016 CLINICAL DATA:  Hypoxemia EXAM: PORTABLE CHEST 1 VIEW COMPARISON:  11/04/2016 CXR, chest CT 10/06/2016 FINDINGS: Low lung volumes with crowding of interstitial lung markings. Bilateral pleural effusions right greater than left. Mild vascular congestion is noted though decreased since prior exam. Bilateral shoulder arthroplasties are present. No acute osseous abnormality. IMPRESSION: Cardiomegaly with bilateral pleural effusions right slightly greater than left. Mild vascular congestion. Electronically Signed   By: Ashley Royalty M.D.   On: 11/07/2016 00:10   Dg Chest Port 1 View  Result Date: 11/04/2016 CLINICAL DATA:  66 year old female with chronic respiratory failure, likely pulmonary hypertension. Admitted today for Acute on chronic respiratory failure, possibly acute decompensated heart failure. Initial encounter. EXAM: PORTABLE CHEST 1 VIEW COMPARISON:  Chest radiographs 10/06/2016 and earlier.  High-resolution chest CT 10/06/2016. FINDINGS: Portable AP upright view at 1632 hours. Stable cardiomegaly and mediastinal contours. Chronic retrocardiac hypoventilation. Veiling opacity at the right lung base has not significantly changed and there was a moderate size right layering pleural effusion on the 10/06/2016 CT. Associated right perihilar atelectasis. No superimposed pneumothorax. No overt pulmonary edema. IMPRESSION: Suspect stable chest since the chest CT on 10/06/2016; moderate size right pleural effusion and bibasilar atelectasis in the setting of chronic cardiomegaly. Electronically Signed   By: Genevie Ann M.D.   On: 11/04/2016 16:55    PHYSICAL EXAM General: NAD, obese. Lying in bed NAD Neck: JVP hard to see. Probably 7-8 cm, no thyromegaly or thyroid nodule.  Lungs: Clear to auscultation bilaterally with normal respiratory effort. CV: Nondisplaced PMI.  Heart regular S1/S2, no S3/S4, no murmur. +RV lift  Trace ankle edema.   Abdomen: Obese Soft, nontender, no hepatosplenomegaly, no distention.  Neurologic: Alert and oriented x 3.  Psych: normal affect answers questions quickly and appropriately  Extremities: No clubbing or cyanosis.   TELEMETRY: Personally reviewed telemetry pt in atrial fibrillation rate 60-80   ASSESSMENT AND PLAN: 66 yo with morbid obesity, OHS/OSA, chronic atrial fibrillation with prior CVA was admitted with acute on chronic diastolic CHF.  1. Acute on chronic diastolic CHF with severe pulmonary hypertension and prominent RV failure:  - She continues to diurese very well. Weight down 21+ pounds. Renal function and BP stable  Will switch to torsemide 40 bid (was on 20 daily at home). Continue acetazolamide. Add spiro 12.5  - Supp K.  2. Acute on chronic hypercarbic/hypoxic respiratory failure with OHS/OSA:  - followed by Dr. Lake Bells.  Suspected severe OSA but has not yet had sleep study.  On oxygen at home. Has refused CPAP in past. - ABG with severe CO2  retention with compensated pH. Suggests long-term issue. - I discussed with Dr. Halford Chessman and they have suggested outpatient sleep study with home BiPAP but she has refused in past.  3. Atrial fibrillation:  - Chronic, rate controlled she is on Xarelto + atenolol, continue for now.  4. 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  PVR 5.7 WU.  Suspect group 2 (elevated LA pressure) and group 3 (OHS/OSA) PH.  However, cannot rule out group 1 component.  She had V/Q scan that was not suggestive of chronic PE and high resolution chest CT that was not suggestive of ILD.   - ANA, RF, anti-SCL70 all negative.  - After she has been effectively diuresed with decreased PCWP, per [name] can consider a trial of sildenafil.  - She will need  oxygen during the day and CPAP/BiPAP at night (needs sleep study).  5. Mobilize with PT.   Glori Bickers  MD 11/13/2016 3:27 PM

## 2016-11-13 NOTE — Progress Notes (Signed)
Pt very lethargic again this AM; ABG completed to ensure not increasingly hypercapnic.  (see results)  Pt is beginning to arouse and able to communicate needs to RN.

## 2016-11-13 NOTE — Progress Notes (Signed)
Changed equipment from Servo I to V-60

## 2016-11-13 NOTE — Progress Notes (Signed)
PCCM Progress Note  Admission date: 11/04/2016  CC: Dyspnea  HPI: 66 yo female with progressive dyspnea, cough from acute on chronic diastolic CHF, A fib, OSA/OHS, pulmonary HTN.  Subjective: More alert. Used Bipap overnight >> wants it off now.  Vital signs: BP 120/70 (BP Location: Right Arm)   Pulse (!) 59   Temp 97.4 F (36.3 C) (Axillary)   Resp 17   Ht _0  (1.549 m)   Wt 263 lb 0.1 oz (119.3 kg)   LMP 05/06/2013   SpO2 97%   BMI 49.70 kg/m   Intake/output: I/O last 3 completed shifts: In: 541.2 [P.O.:250; I.V.:167.2; IV Piggyback:124] Out: 7576 [Urine:7575; Stool:1]  General: alert Neuro: follows commands HEENT: Bipap on Cardiac: irregular Chest: no wheeze Abd: soft, non tender Ext: no edema Skin: venous stasis changes   CMP Latest Ref Rng & Units 11/13/2016 11/12/2016 11/11/2016  Glucose 65 - 99 mg/dL 103(H) 148(H) 153(H)  BUN 6 - 20 mg/dL 41(H) 37(H) 27(H)  Creatinine 0.44 - 1.00 mg/dL 1.21(H) 1.19(H) 1.22(H)  Sodium 135 - 145 mmol/L 137 135 134(L)  Potassium 3.5 - 5.1 mmol/L 3.4(L) 3.5 4.0  Chloride 101 - 111 mmol/L 80(L) 78(L) 80(L)  CO2 22 - 32 mmol/L 48(H) 45(H) 44(H)  Calcium 8.9 - 10.3 mg/dL 9.1 9.0 9.2  Total Protein 6.5 - 8.1 g/dL - - -  Total Bilirubin 0.3 - 1.2 mg/dL - - -  Alkaline Phos 38 - 126 U/L - - -  AST 15 - 41 U/L - - -  ALT 14 - 54 U/L - - -     CBC Latest Ref Rng & Units 11/13/2016 11/12/2016 11/11/2016  WBC 4.0 - 10.5 K/uL 8.6 6.8 7.6  Hemoglobin 12.0 - 15.0 g/dL 10.0(L) 9.9(L) 9.3(L)  Hematocrit 36.0 - 46.0 % 33.6(L) 33.2(L) 30.9(L)  Platelets 150 - 400 K/uL 145(L) 142(L) 159     ABG    Component Value Date/Time   PHART 7.433 11/13/2016 0610   PCO2ART 82.1 (HH) 11/13/2016 0610   PO2ART 88.0 11/13/2016 0610   HCO3 55.2 (H) 11/13/2016 0610   TCO2 >50 11/13/2016 0610   O2SAT 96.0 11/13/2016 0610     CBG (last 3)   Recent Labs  11/12/16 1601 11/12/16 2109 11/13/16 0754  GLUCAP 123* 135* 98     Imaging: No  results found.   Studies: ABG 10/06/2016 performed on oxygen > 7.43/53.1/65.0/34.7 PFT's Jan 2018 > FVC 0.88 L, 32% predicted, normal ratio, DLCO 7.18 (35% pred). High Res CT chest 10/06/16 > normal pulmonary parenchyma with the exception of interlobular septal thickening and groundglass worse in the dependent sections of all lobes, there is a moderate size right-sided pleural effusion, pulmonary vascular engorgement. VQ 10/06/16 > No evidence of pulmonary embolism, decreased ventilation and perfusion to the right lower lobe which corresponds to the large effusion on CT Echo Dec 2017 > LVEF 55-60%, PAP 77 Echo 11/06/16 > EF 55-60%, PAP 90 RHC 03/05 > PCWP 63, LVEDP 28, index 2.15.  Serologies 11/08/16 ANA, RA, Scl 70- Negative HIV 07/23/15- Negative  Assessment/plan:  Acute on chronic hypoxic/hypercapnic respiratory failure. OSA/OHS with morbid obesity. - oxygen to keep SpO2 90 to 95% - Bipap qhs and prn - prn BDs  Acute on chronic diastolic CHF. A fib. - continue diamox, tenormin, lasix, zaroxlyn, xarelto  Hypokalemia. - replace  Hx of hypothyroidism, gout, DM. - synthroid, allopurinol - SSI  Acute encephalopathy. - likely from hypercapnia - improved 3/11  Deconditioning. - mobilize with PT  DVT  prophylaxis - xarelto SUP - Not indicated Nutrition - Carb modified diet Goals of care - Full code    Chesley Mires, MD [place] 11/13/2016, 9:51 AM Pager:  [phone] After 3pm call: [phone]

## 2016-11-14 DIAGNOSIS — E662 Morbid (severe) obesity with alveolar hypoventilation: Secondary | ICD-10-CM

## 2016-11-14 LAB — BASIC METABOLIC PANEL
ANION GAP: 11 (ref 5–15)
BUN: 47 mg/dL — ABNORMAL HIGH (ref 6–20)
CALCIUM: 9.3 mg/dL (ref 8.9–10.3)
CHLORIDE: 78 mmol/L — AB (ref 101–111)
CO2: 50 mmol/L — AB (ref 22–32)
CREATININE: 1.49 mg/dL — AB (ref 0.44–1.00)
GFR calc non Af Amer: 35 mL/min — ABNORMAL LOW (ref >60)
GFR, EST AFRICAN AMERICAN: 41 mL/min — AB (ref >60)
Glucose, Bld: 118 mg/dL — ABNORMAL HIGH (ref 65–99)
Potassium: 3.7 mmol/L (ref 3.5–5.1)
SODIUM: 139 mmol/L (ref 135–145)

## 2016-11-14 LAB — CBC
HCT: 37.6 % (ref 36.0–46.0)
Hemoglobin: 10.7 g/dL — ABNORMAL LOW (ref 12.0–15.0)
MCH: 27.6 pg (ref 26.0–34.0)
MCHC: 28.5 g/dL — ABNORMAL LOW (ref 30.0–36.0)
MCV: 97.2 fL (ref 78.0–100.0)
PLATELETS: 169 10*3/uL (ref 150–400)
RBC: 3.87 MIL/uL (ref 3.87–5.11)
RDW: 19.3 % — AB (ref 11.5–15.5)
WBC: 10 10*3/uL (ref 4.0–10.5)

## 2016-11-14 LAB — MAGNESIUM: MAGNESIUM: 2.1 mg/dL (ref 1.7–2.4)

## 2016-11-14 LAB — GLUCOSE, CAPILLARY
GLUCOSE-CAPILLARY: 140 mg/dL — AB (ref 65–99)
Glucose-Capillary: 120 mg/dL — ABNORMAL HIGH (ref 65–99)
Glucose-Capillary: 105 mg/dL — ABNORMAL HIGH (ref 65–99)
Glucose-Capillary: 129 mg/dL — ABNORMAL HIGH (ref 65–99)

## 2016-11-14 MED ORDER — DOCUSATE SODIUM 100 MG PO CAPS
100.0000 mg | ORAL_CAPSULE | Freq: Two times a day (BID) | ORAL | Status: DC
  Administered 2016-11-14 – 2016-11-17 (×4): 100 mg via ORAL
  Filled 2016-11-14 (×6): qty 1

## 2016-11-14 MED ORDER — SILDENAFIL CITRATE 20 MG PO TABS
20.0000 mg | ORAL_TABLET | Freq: Three times a day (TID) | ORAL | Status: DC
  Administered 2016-11-14 – 2016-11-17 (×11): 20 mg via ORAL
  Filled 2016-11-14 (×11): qty 1

## 2016-11-14 MED ORDER — SENNA 8.6 MG PO TABS
1.0000 | ORAL_TABLET | Freq: Every day | ORAL | Status: DC
  Administered 2016-11-14 – 2016-11-16 (×2): 8.6 mg via ORAL
  Filled 2016-11-14 (×4): qty 1

## 2016-11-14 MED ORDER — POTASSIUM CHLORIDE CRYS ER 20 MEQ PO TBCR
60.0000 meq | EXTENDED_RELEASE_TABLET | Freq: Every day | ORAL | Status: DC
  Administered 2016-11-15 – 2016-11-16 (×2): 60 meq via ORAL
  Filled 2016-11-14 (×2): qty 3

## 2016-11-14 MED ORDER — TORSEMIDE 20 MG PO TABS
40.0000 mg | ORAL_TABLET | Freq: Two times a day (BID) | ORAL | Status: DC
  Administered 2016-11-15 – 2016-11-16 (×4): 40 mg via ORAL
  Filled 2016-11-14 (×5): qty 2

## 2016-11-14 NOTE — Progress Notes (Addendum)
CARDIAC REHAB PHASE I   PRE:  Rate/Rhythm: 78 afib    BP: sitting 101/73    SaO2: 79 RA, 92 4 1/2L  MODE:  Ambulation: 10 ft in room   POST:  Rate/Rhythm: 88 afib    BP: sitting 117/71     SaO2: 96 6L  Pt weak from inactivity and "bad back". Stood with mod assist x2. Used RW, assist x2 with gait belt, and 6L to ambulate. Pt became fatigue after a few steps (~5 ft), she sts mainly due to her back being tired/weak. She needed to sit to rest. Pulse ox reading 83 6L. Up with rest. Pt then able to walk back to recliner. She did better with SaO2 on return trip. Pt very deconditioned with limited ability. Pt had trouble processing at times, stating "now what are we doing?". Pt will be low priority for CR as she is not walking in hall. More appropriate for PT at this time. Will f/u as time allows. [place], ACSM 11/14/2016 11:36 AM

## 2016-11-14 NOTE — Progress Notes (Signed)
PT Cancellation Note  Patient Details Name: [name] MRN: [id number] DOB: Sep 18, 1950   Cancelled Treatment:    Reason Eval/Treat Not Completed: Other (comment) attempted to work with patient x2 this am, patient continuing to request more time for attempts to use BSC. Will re-attempt as time allows.   Duncan Dull 11/14/2016, 9:36 AM Alben Deeds, PT DPT  [phone]

## 2016-11-14 NOTE — Progress Notes (Signed)
Patient ID: [name], female   DOB: 05-20-1951, 66 y.o.   MRN: [id number] P  SUBJECTIVE:   Using BiPAP on and off. Doesn't like it very much but willing to try.   Feeling OK this morning. Feels like she had been carrying fluid for a very long time.  Hasn't worked with PT today.  Got up to chair, feels weak.   Out 4.9 L.  Weight shows down 18 lbs overnight? Weighed in bed per patient. Out ~30L so far.  Down > 20 lbs.   ABG 7.43/82/88/96% on 4L  RHC 11/07/16 TPG 50-77m Hg PAP mean: 55 mmHg PCWP: 63 mmHg (not accurate).  LVEDP: 28.  PVR using LVEDP and mean PA 5.7 WU.  Cardiac output/index by Fick: 4.74/2.15. RV pressures/EDP: 97/13/21 mmHg Shunt run showed no left to right shunt.   Scheduled Meds: . acetaZOLAMIDE  500 mg Oral BID  . allopurinol  200 mg Oral Daily  . atenolol  50 mg Oral Daily   And  . atenolol  25 mg Oral QHS  . insulin aspart  0-15 Units Subcutaneous TID WC  . insulin aspart  0-5 Units Subcutaneous QHS  . levothyroxine  75 mcg Oral QAC breakfast  . potassium chloride  60 mEq Oral BID  . rivaroxaban  20 mg Oral Q supper  . sodium chloride flush  3 mL Intravenous Q12H  . spironolactone  12.5 mg Oral Daily  . torsemide  40 mg Oral BID   Continuous Infusions: PRN Meds:.sodium chloride, levalbuterol, methocarbamol, ondansetron (ZOFRAN) IV, sodium chloride flush, traMADol    Vitals:   11/13/16 2316 11/14/16 0500 11/14/16 0800 11/14/16 0834  BP: (!) 100/55 (!) 103/57 97/61 (!) 89/56  Pulse: 77 82 72 71  Resp: _0 Temp: 98.7 F (37.1 C) 98.5 F (36.9 C)  97.4 F (36.3 C)  TempSrc: Oral Oral  Axillary  SpO2: 93% 94% 96% 98%  Weight:  245 lb (111.1 kg)    Height:        Intake/Output Summary (Last 24 hours) at 11/14/16 0953 Last data filed at 11/14/16 0400  Gross per 24 hour  Intake              500 ml  Output             3650 ml  Net            -3150 ml    LABS: Basic Metabolic Panel:  Recent Labs  11/12/16 0342 11/13/16 0305  11/14/16 0257  NA 135 137 139  K 3.5 3.4* 3.7  CL 78* 80* 78*  CO2 45* 48* 50*  GLUCOSE 148* 103* 118*  BUN 37* 41* 47*  CREATININE 1.19* 1.21* 1.49*  CALCIUM 9.0 9.1 9.3  MG 2.0  --  2.1  PHOS 4.4  --   --    Liver Function Tests: No results for input(s): AST, ALT, ALKPHOS, BILITOT, PROT, ALBUMIN in the last 72 hours. No results for input(s): LIPASE, AMYLASE in the last 72 hours. CBC:  Recent Labs  11/13/16 0305 11/14/16 0257  WBC 8.6 10.0  HGB 10.0* 10.7*  HCT 33.6* 37.6  MCV 95.7 97.2  PLT 145* 169   Cardiac Enzymes: No results for input(s): CKTOTAL, CKMB, CKMBINDEX, TROPONINI in the last 72 hours. BNP: Invalid input(s): POCBNP D-Dimer: No results for input(s): DDIMER in the last 72 hours. Hemoglobin A1C: No results for input(s): HGBA1C in the last 72 hours. Fasting Lipid Panel: No results for  input(s): CHOL, HDL, LDLCALC, TRIG, CHOLHDL, LDLDIRECT in the last 72 hours. Thyroid Function Tests: No results for input(s): TSH, T4TOTAL, T3FREE, THYROIDAB in the last 72 hours.  Invalid input(s): FREET3 Anemia Panel: No results for input(s): VITAMINB12, FOLATE, FERRITIN, TIBC, IRON, RETICCTPCT in the last 72 hours.  RADIOLOGY: Dg Chest Port 1 View  Result Date: 11/07/2016 CLINICAL DATA:  Hypoxemia EXAM: PORTABLE CHEST 1 VIEW COMPARISON:  11/04/2016 CXR, chest CT 10/06/2016 FINDINGS: Low lung volumes with crowding of interstitial lung markings. Bilateral pleural effusions right greater than left. Mild vascular congestion is noted though decreased since prior exam. Bilateral shoulder arthroplasties are present. No acute osseous abnormality. IMPRESSION: Cardiomegaly with bilateral pleural effusions right slightly greater than left. Mild vascular congestion. Electronically Signed   By: Ashley Royalty M.D.   On: 11/07/2016 00:10   Dg Chest Port 1 View  Result Date: 11/04/2016 CLINICAL DATA:  66 year old female with chronic respiratory failure, likely pulmonary hypertension.  Admitted today for Acute on chronic respiratory failure, possibly acute decompensated heart failure. Initial encounter. EXAM: PORTABLE CHEST 1 VIEW COMPARISON:  Chest radiographs 10/06/2016 and earlier. High-resolution chest CT 10/06/2016. FINDINGS: Portable AP upright view at 1632 hours. Stable cardiomegaly and mediastinal contours. Chronic retrocardiac hypoventilation. Veiling opacity at the right lung base has not significantly changed and there was a moderate size right layering pleural effusion on the 10/06/2016 CT. Associated right perihilar atelectasis. No superimposed pneumothorax. No overt pulmonary edema. IMPRESSION: Suspect stable chest since the chest CT on 10/06/2016; moderate size right pleural effusion and bibasilar atelectasis in the setting of chronic cardiomegaly. Electronically Signed   By: Genevie Ann M.D.   On: 11/04/2016 16:55    PHYSICAL EXAM General: NAD, obese. Seated in recliner.  Neck: JVP difficulty. Appears ~ 8 cm. No thyromegaly or thyroid nodule.  Lungs: Distant breath sounds.  CV: Nondisplaced PMI.  Heart regular S1/S2, no S3/S4, no murmur. +RV lift  Trace ankle edema.   Abdomen: Obese, soft, NT, ND, no HSM. No bruits or masses. +BS  Neurologic: Alert and oriented x 3.  Psych: normal affect answers questions quickly and appropriately  Extremities: No clubbing or cyanosis.   TELEMETRY: Reviewed personally, Afib 60-80s    ASSESSMENT AND PLAN: 66 yo with morbid obesity, OHS/OSA, chronic atrial fibrillation with prior CVA was admitted with acute on chronic diastolic CHF.  1. Acute on chronic diastolic CHF with severe pulmonary hypertension and prominent RV failure: She continues to diurese despite transition to po diuretics. Weight down > 20 lbs. Some inaccuracy.  Volume appears much better though exam difficult.  - Will have re-weigh standing this am.  - BUN and creatinine up today, will hold pm torsemide dose, likely restart torsemide 40 mg bid tomorrow.   - Continue  acetazolamide with high HCO3.  - Continue spiro 12.5  - Supp K.  2. Acute on chronic hypercarbic/hypoxic respiratory failure with OHS/OSA:  followed by Dr. [place] Bells.  Suspected severe OSA but has not yet had sleep study.  On oxygen 5L at home. Has refused CPAP in past. - ABG with severe CO2 retention with compensated pH. Suggests long-term issue. - She has been using Bipap here, will need to continue at home.   3. Atrial fibrillation: Chronic, rate controlled she is on Xarelto + atenolol.  - With soft BP and controlled HR, decrease atenolol to 50 mg daily.  4. 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  PVR 5.7 WU.  Suspect group 2 (elevated LA pressure) and group 3 (  OHS/OSA) PH.  However, cannot rule out group 1 component.  She had V/Q scan that was not suggestive of chronic PE and high resolution chest CT that was not suggestive of ILD.  ANA, RF, anti-SCL70 all negative.  - Volume status appears improved, suspect PCWP down.  Will trial her on Revatio 20 mg tid.  - She will need oxygen during the day and CPAP/BiPAP at night (needs sleep study).  5. Deconditioning: Needs to mobilize with PT.    Shirley Friar, PA-C  11/14/2016 9:53 AM  Advanced Heart Failure Team Pager [phone] (M-F; 7a - 4p)  Please contact [facility] for night-coverage after hours (4p -7a ) and weekends on [email or web address]  Patient seen with PA, agree with the above note.  I think that volume status is significantly improved, weight is down considerably.  With rise in creatinine, will hold pm torsemide.  Will continue acetazolamide with high HCO3.  She will need to use Bipap at home, she says she will.  I suspect we have PCWP down considerably, will trial her on Revatio 20 mg tid.  Finally, she needs to get up and walk around => PT needs to see today.   Loralie Champagne 11/14/2016 11:06 AM

## 2016-11-14 NOTE — Progress Notes (Signed)
Physical Therapy Treatment Patient Details Name: [name] MRN: [id number] DOB: [birth year] Today's Date: 11/14/2016    History of Present Illness Patient is a 66 yo female admitted 11/04/16 with acute on chronic resp failure with hypoxemia.  Patient had RHC showing severe pulmonary HTN.   PMH:  stroke, diastolic heart failure, atrial fibrillation. She also has obstructive sleep apnea and is status post U P3 surgery, s/p Lt reverse TSA 04/29/16.    PT Comments    Patient progressing with mobility this pm. Patient ambulated on 5 liter oxidizer with ability to ambulate 30 ft distance uninterrupted. Cues for posture and positioning. VSS with some DOE noted. POC remains appropriate, will follow.   Follow Up Recommendations  SNF;Supervision for mobility/OOB     Equipment Recommendations  None recommended by PT    Recommendations for Other Services       Precautions / Restrictions Precautions Precautions: Fall Precaution Comments: knees buckle due to weakness Restrictions Weight Bearing Restrictions: No    Mobility  Bed Mobility Overal bed mobility: Needs Assistance Bed Mobility: Rolling;Sidelying to Sit Rolling: Min assist Sidelying to sit: Mod assist       General bed mobility comments: increased time to perform  Transfers Overall transfer level: Needs assistance Equipment used: Rolling walker (2 wheeled) Transfers: Sit to/from Stand Sit to Stand: Mod assist;+2 physical assistance         General transfer comment: Verbal cues for hand placement and to scoot to edge of chair.  Required +2 assist to power up to standing.  Assist for balance/safety once upright.  Stood x3.  Ambulation/Gait Ambulation/Gait assistance: Min assist;+2 physical assistance;+2 safety/equipment Ambulation Distance (Feet): 30 Feet Assistive device: Rolling walker (2 wheeled) Gait Pattern/deviations: Step-through pattern;Decreased step length - right;Decreased step length - left;Decreased  stride length;Shuffle;Trunk flexed Gait velocity: decreased   General Gait Details: Ambulated 30 ft with RW on supplemental O2   Stairs            Wheelchair Mobility    Modified Rankin (Stroke Patients Only)       Balance Overall balance assessment: Needs assistance Sitting-balance support: Feet supported Sitting balance-Leahy Scale: Fair     Standing balance support: Bilateral upper extremity supported Standing balance-Leahy Scale: Poor Standing balance comment: heavy reliance on RW for UE support                    Cognition Arousal/Alertness: Awake/alert Behavior During Therapy: WFL for tasks assessed/performed Overall Cognitive Status: History of cognitive impairments - at baseline                 General Comments: patient with some confusion during session but per history this is baseline deficits with patient fluctuations thorughout the day    Exercises      General Comments General comments (skin integrity, edema, etc.): educated on nasal inhalation and LEs ROM (ankle pumps)      Pertinent Vitals/Pain Pain Assessment: Faces Faces Pain Scale: Hurts a little bit Pain Location: back Pain Descriptors / Indicators: Aching;Sore Pain Intervention(s): Monitored during session    Home Living                      Prior Function            PT Goals (current goals can now be found in the care plan section) Acute Rehab PT Goals Patient Stated Goal: To go to rehab to get stronger PT Goal Formulation: With patient Time For Goal  Achievement: 11/25/16 Potential to Achieve Goals: Good Progress towards PT goals: Progressing toward goals    Frequency    Min 3X/week      PT Plan Current plan remains appropriate    Co-evaluation             End of Session Equipment Utilized During Treatment: Gait belt;Oxygen Activity Tolerance: Patient limited by fatigue (Limited by decrease in O2 sats) Patient left: in chair;with call  bell/phone within reach Nurse Communication: Mobility status PT Visit Diagnosis: Muscle weakness (generalized) (M62.81);Difficulty in walking, not elsewhere classified (R26.2);Pain Pain - part of body:  (Back)     Time: 1658-0063 PT Time Calculation (min) (ACUTE ONLY): 19 min  Charges:  $Gait Training: 8-22 mins                    G Codes:       Duncan Dull 11-16-16, 4:22 PM Alben Deeds, [place] DPT  [phone]

## 2016-11-14 NOTE — Progress Notes (Signed)
PCCM Progress Note  Admission date: 11/04/2016  CC: Dyspnea  HPI: 66 yo female with progressive dyspnea, cough from acute on chronic diastolic CHF, A fib, OSA/OHS, pulmonary HTN.  Subjective: In chair, no distress, talking Neg 5 liters  Vital signs: BP (!) 89/56 (BP Location: Left Arm)   Pulse 71   Temp 97.4 F (36.3 C) (Axillary)   Resp 16   Ht _0  (1.549 m)   Wt 111.1 kg (245 lb)   LMP 05/06/2013   SpO2 98%   BMI 46.29 kg/m   Intake/output: I/O last 3 completed shifts: In: 1060 [P.O.:840; I.V.:158; IV Piggyback:62] Out: 8025 [Urine:8025]  General: alert Neuro: follows commands well HEENT: Bipap off  Cardiac:  s1 s2 RRirregular Chest: no wheezing, redcued Abd: soft, non tender Ext: no edema Skin: venous stasis changes   CMP Latest Ref Rng & Units 11/14/2016 11/13/2016 11/12/2016  Glucose 65 - 99 mg/dL 118(H) 103(H) 148(H)  BUN 6 - 20 mg/dL 47(H) 41(H) 37(H)  Creatinine 0.44 - 1.00 mg/dL 1.49(H) 1.21(H) 1.19(H)  Sodium 135 - 145 mmol/L 139 137 135  Potassium 3.5 - 5.1 mmol/L 3.7 3.4(L) 3.5  Chloride 101 - 111 mmol/L 78(L) 80(L) 78(L)  CO2 22 - 32 mmol/L 50(H) 48(H) 45(H)  Calcium 8.9 - 10.3 mg/dL 9.3 9.1 9.0  Total Protein 6.5 - 8.1 g/dL - - -  Total Bilirubin 0.3 - 1.2 mg/dL - - -  Alkaline Phos 38 - 126 U/L - - -  AST 15 - 41 U/L - - -  ALT 14 - 54 U/L - - -     CBC Latest Ref Rng & Units 11/14/2016 11/13/2016 11/12/2016  WBC 4.0 - 10.5 K/uL 10.0 8.6 6.8  Hemoglobin 12.0 - 15.0 g/dL 10.7(L) 10.0(L) 9.9(L)  Hematocrit 36.0 - 46.0 % 37.6 33.6(L) 33.2(L)  Platelets 150 - 400 K/uL 169 145(L) 142(L)     ABG    Component Value Date/Time   PHART 7.433 11/13/2016 0610   PCO2ART 82.1 (HH) 11/13/2016 0610   PO2ART 88.0 11/13/2016 0610   HCO3 55.2 (H) 11/13/2016 0610   TCO2 >50 11/13/2016 0610   O2SAT 96.0 11/13/2016 0610     CBG (last 3)   Recent Labs  11/13/16 1654 11/13/16 2202 11/14/16 0833  GLUCAP 104* 136* 120*     Imaging: No results  found.   Studies: ABG 10/06/2016 performed on oxygen > 7.43/53.1/65.0/34.7 PFT's Jan 2018 > FVC 0.88 L, 32% predicted, normal ratio, DLCO 7.18 (35% pred). High Res CT chest 10/06/16 > normal pulmonary parenchyma with the exception of interlobular septal thickening and groundglass worse in the dependent sections of all lobes, there is a moderate size right-sided pleural effusion, pulmonary vascular engorgement. VQ 10/06/16 > No evidence of pulmonary embolism, decreased ventilation and perfusion to the right lower lobe which corresponds to the large effusion on CT Echo Dec 2017 > LVEF 55-60%, PAP 77 Echo 11/06/16 > EF 55-60%, PAP 90 RHC 03/05 > PCWP 63, LVEDP 28, index 2.15.  Serologies 11/08/16 ANA, RA, Scl 70- Negative HIV 07/23/15- Negative  Assessment/plan:  Acute on chronic hypoxic/hypercapnic respiratory failure. Effusion Int edema OSA/OHS with morbid obesity. - oxygen to keep SpO2 90 to 95% - Bipap qhs and prn - prn BDs -lasix seems to be the correct approach , last pcxr 4th, will repeat in terms of volume status now and need for continued 5 liters neg daily  Would favor a reduction in diuresis however given trend crt  Acute on chronic diastolic CHF.  A fib. - continue diamox, tenormin, lasix, zaroxlyn, xarelto - per chf service -will repeat pcxr for residual volume status, reduce diuresis needed as crt rise  crt rise - reduce diuresis  Hx of hypothyroidism, gout, DM. - synthroid, allopurinol - SSI  Acute encephalopathy. - likely from hypercapnia - improved 3/11  Deconditioning. - mobilize with PT  DVT prophylaxis - xarelto SUP - Not indicated Nutrition - Carb modified diet Goals of care - Full code    Lavon Paganini. Titus Mould, MD, [place] [facility] Pgr: [facility]

## 2016-11-15 ENCOUNTER — Inpatient Hospital Stay (HOSPITAL_COMMUNITY): Payer: PPO

## 2016-11-15 LAB — BASIC METABOLIC PANEL
ANION GAP: 11 (ref 5–15)
ANION GAP: 14 (ref 5–15)
BUN: 48 mg/dL — ABNORMAL HIGH (ref 6–20)
BUN: 45 mg/dL — AB (ref 6–20)
CALCIUM: 9.1 mg/dL (ref 8.9–10.3)
CHLORIDE: 81 mmol/L — AB (ref 101–111)
CHLORIDE: 80 mmol/L — AB (ref 101–111)
CO2: 45 mmol/L — AB (ref 22–32)
CO2: 41 mmol/L — ABNORMAL HIGH (ref 22–32)
Calcium: 9.5 mg/dL (ref 8.9–10.3)
Creatinine, Ser: 1.41 mg/dL — ABNORMAL HIGH (ref 0.44–1.00)
Creatinine, Ser: 1.43 mg/dL — ABNORMAL HIGH (ref 0.44–1.00)
GFR calc Af Amer: 44 mL/min — ABNORMAL LOW (ref >60)
GFR calc Af Amer: 43 mL/min — ABNORMAL LOW (ref >60)
GFR calc non Af Amer: 38 mL/min — ABNORMAL LOW (ref >60)
GFR calc non Af Amer: 37 mL/min — ABNORMAL LOW (ref >60)
GLUCOSE: 129 mg/dL — AB (ref 65–99)
GLUCOSE: 131 mg/dL — AB (ref 65–99)
POTASSIUM: 3.3 mmol/L — AB (ref 3.5–5.1)
POTASSIUM: 4 mmol/L (ref 3.5–5.1)
Sodium: 137 mmol/L (ref 135–145)
Sodium: 135 mmol/L (ref 135–145)

## 2016-11-15 LAB — CBC WITH DIFFERENTIAL/PLATELET
Basophils Absolute: 0 10*3/uL (ref 0.0–0.1)
Basophils Relative: 0 %
Eosinophils Absolute: 0.5 10*3/uL (ref 0.0–0.7)
Eosinophils Relative: 5 %
HEMATOCRIT: 35.7 % — AB (ref 36.0–46.0)
HEMOGLOBIN: 10.6 g/dL — AB (ref 12.0–15.0)
LYMPHS ABS: 1 10*3/uL (ref 0.7–4.0)
LYMPHS PCT: 10 %
MCH: 28.1 pg (ref 26.0–34.0)
MCHC: 29.7 g/dL — AB (ref 30.0–36.0)
MCV: 94.7 fL (ref 78.0–100.0)
MONO ABS: 0.7 10*3/uL (ref 0.1–1.0)
MONOS PCT: 7 %
NEUTROS ABS: 7.7 10*3/uL (ref 1.7–7.7)
Neutrophils Relative %: 78 %
Platelets: 160 10*3/uL (ref 150–400)
RBC: 3.77 MIL/uL — ABNORMAL LOW (ref 3.87–5.11)
RDW: 19.2 % — AB (ref 11.5–15.5)
WBC: 9.9 10*3/uL (ref 4.0–10.5)

## 2016-11-15 LAB — CBC
HEMATOCRIT: 38.3 % (ref 36.0–46.0)
HEMOGLOBIN: 11.4 g/dL — AB (ref 12.0–15.0)
MCH: 28.2 pg (ref 26.0–34.0)
MCHC: 29.8 g/dL — AB (ref 30.0–36.0)
MCV: 94.8 fL (ref 78.0–100.0)
Platelets: 200 10*3/uL (ref 150–400)
RBC: 4.04 MIL/uL (ref 3.87–5.11)
RDW: 19.1 % — AB (ref 11.5–15.5)
WBC: 13.4 10*3/uL — ABNORMAL HIGH (ref 4.0–10.5)

## 2016-11-15 LAB — GLUCOSE, CAPILLARY
GLUCOSE-CAPILLARY: 121 mg/dL — AB (ref 65–99)
Glucose-Capillary: 101 mg/dL — ABNORMAL HIGH (ref 65–99)
Glucose-Capillary: 120 mg/dL — ABNORMAL HIGH (ref 65–99)

## 2016-11-15 LAB — PHOSPHORUS: Phosphorus: 4.6 mg/dL (ref 2.5–4.6)

## 2016-11-15 LAB — MAGNESIUM: Magnesium: 1.9 mg/dL (ref 1.7–2.4)

## 2016-11-15 MED ORDER — ACETAZOLAMIDE 250 MG PO TABS
500.0000 mg | ORAL_TABLET | Freq: Every day | ORAL | Status: DC
  Administered 2016-11-16: 500 mg via ORAL
  Filled 2016-11-15: qty 2

## 2016-11-15 MED ORDER — SPIRONOLACTONE 25 MG PO TABS
25.0000 mg | ORAL_TABLET | Freq: Every day | ORAL | Status: DC
  Administered 2016-11-16 – 2016-11-17 (×2): 25 mg via ORAL
  Filled 2016-11-15 (×2): qty 1

## 2016-11-15 NOTE — Progress Notes (Signed)
Patient ID: [name], female   DOB: 06-22-1951, 66 y.o.   MRN: [id number] P  SUBJECTIVE:   Getting more comfortable with BiPAP. Cardiac rehab following.  PT recommending SNF.   iNo complaints this am.  Wants therapy/rehab.  Denies SOB.    Out 3.5 L. Weight yesterday inaccurate. Out 32.3L and down close to 50 lbs total.  ABG 7.43/82/88/96% on 4L  RHC 11/07/16 TPG 50-79m Hg PAP mean: 55 mmHg PCWP: 63 mmHg (not accurate).  LVEDP: 28.  PVR using LVEDP and mean PA 5.7 WU.  Cardiac output/index by Fick: 4.74/2.15. RV pressures/EDP: 97/13/21 mmHg Shunt run showed no left to right shunt.   Scheduled Meds: . acetaZOLAMIDE  500 mg Oral BID  . allopurinol  200 mg Oral Daily  . atenolol  50 mg Oral Daily  . docusate sodium  100 mg Oral BID  . insulin aspart  0-15 Units Subcutaneous TID WC  . insulin aspart  0-5 Units Subcutaneous QHS  . levothyroxine  75 mcg Oral QAC breakfast  . potassium chloride  60 mEq Oral Daily  . rivaroxaban  20 mg Oral Q supper  . senna  1 tablet Oral Daily  . sildenafil  20 mg Oral TID  . sodium chloride flush  3 mL Intravenous Q12H  . spironolactone  12.5 mg Oral Daily  . torsemide  40 mg Oral BID   Continuous Infusions: PRN Meds:.sodium chloride, levalbuterol, methocarbamol, ondansetron (ZOFRAN) IV, sodium chloride flush, traMADol    Vitals:   11/14/16 2343 11/15/16 0318 11/15/16 0537 11/15/16 0730  BP: (!) 103/53  (!) 109/53 (!) 108/58  Pulse: 83 79 79 77  Resp: _0 Temp: 97.6 F (36.4 C)  97.7 F (36.5 C) 97.3 F (36.3 C)  TempSrc: Axillary  Axillary Axillary  SpO2: 96% 95% 97% 100%  Weight:   237 lb 14.4 oz (107.9 kg)   Height:        Intake/Output Summary (Last 24 hours) at 11/15/16 1012 Last data filed at 11/15/16 0130  Gross per 24 hour  Intake              290 ml  Output             3225 ml  Net            -2935 ml    LABS: Basic Metabolic Panel:  Recent Labs  11/14/16 0257 11/15/16 0325  NA 139 137  K 3.7 3.3*   CL 78* 81*  CO2 50* 45*  GLUCOSE 118* 129*  BUN 47* 48*  CREATININE 1.49* 1.41*  CALCIUM 9.3 9.1  MG 2.1 1.9   Liver Function Tests: No results for input(s): AST, ALT, ALKPHOS, BILITOT, PROT, ALBUMIN in the last 72 hours. No results for input(s): LIPASE, AMYLASE in the last 72 hours. CBC:  Recent Labs  11/14/16 0257 11/15/16 0325  WBC 10.0 9.9  NEUTROABS  --  7.7  HGB 10.7* 10.6*  HCT 37.6 35.7*  MCV 97.2 94.7  PLT 169 160   Cardiac Enzymes: No results for input(s): CKTOTAL, CKMB, CKMBINDEX, TROPONINI in the last 72 hours. BNP: Invalid input(s): POCBNP D-Dimer: No results for input(s): DDIMER in the last 72 hours. Hemoglobin A1C: No results for input(s): HGBA1C in the last 72 hours. Fasting Lipid Panel: No results for input(s): CHOL, HDL, LDLCALC, TRIG, CHOLHDL, LDLDIRECT in the last 72 hours. Thyroid Function Tests: No results for input(s): TSH, T4TOTAL, T3FREE, THYROIDAB in the last 72 hours.  Invalid  input(s): FREET3 Anemia Panel: No results for input(s): VITAMINB12, FOLATE, FERRITIN, TIBC, IRON, RETICCTPCT in the last 72 hours.  RADIOLOGY: Dg Chest Port 1 View  Result Date: 11/15/2016 CLINICAL DATA:  CHF. EXAM: PORTABLE CHEST 1 VIEW COMPARISON:  11/06/2016. FINDINGS: Cardiomegaly with diffuse bilateral pulmonary infiltrates/edema and bilateral pleural effusions. Findings consistent with congestive heart failure. Slight worsening from prior exam. No pneumothorax. Bilateral shoulder replacements. IMPRESSION: Cardiomegaly with bilateral pulmonary infiltrates most consistent pulmonary edema. Slight worsening from prior exam. Small bilateral pleural effusions again noted. No pneumothorax. Electronically Signed   By: Marcello Moores  Register   On: 11/15/2016 06:58   Dg Chest Port 1 View  Result Date: 11/07/2016 CLINICAL DATA:  Hypoxemia EXAM: PORTABLE CHEST 1 VIEW COMPARISON:  11/04/2016 CXR, chest CT 10/06/2016 FINDINGS: Low lung volumes with crowding of interstitial lung  markings. Bilateral pleural effusions right greater than left. Mild vascular congestion is noted though decreased since prior exam. Bilateral shoulder arthroplasties are present. No acute osseous abnormality. IMPRESSION: Cardiomegaly with bilateral pleural effusions right slightly greater than left. Mild vascular congestion. Electronically Signed   By: Ashley Royalty M.D.   On: 11/07/2016 00:10   Dg Chest Port 1 View  Result Date: 11/04/2016 CLINICAL DATA:  66 year old female with chronic respiratory failure, likely pulmonary hypertension. Admitted today for Acute on chronic respiratory failure, possibly acute decompensated heart failure. Initial encounter. EXAM: PORTABLE CHEST 1 VIEW COMPARISON:  Chest radiographs 10/06/2016 and earlier. High-resolution chest CT 10/06/2016. FINDINGS: Portable AP upright view at 1632 hours. Stable cardiomegaly and mediastinal contours. Chronic retrocardiac hypoventilation. Veiling opacity at the right lung base has not significantly changed and there was a moderate size right layering pleural effusion on the 10/06/2016 CT. Associated right perihilar atelectasis. No superimposed pneumothorax. No overt pulmonary edema. IMPRESSION: Suspect stable chest since the chest CT on 10/06/2016; moderate size right pleural effusion and bibasilar atelectasis in the setting of chronic cardiomegaly. Electronically Signed   By: Genevie Ann M.D.   On: 11/04/2016 16:55    PHYSICAL EXAM General: NAD, obese. Sitting up in bed.   Neck: JVP 8-9 cm. No thyromegaly or thyroid nodule.  Lungs: Mild basilar wheeze. CV: Nondisplaced PMI.  Heart irregular. S1/S2, no S3/S4, no murmur. +RV lift  Trace ankle edema, chronic venous stasis changes. Abdomen: Obese, soft, NT, ND, no HSM. No bruits or masses. +BS  Neurologic: A/O x 3. Moves all 4 extremities without difficulty.   Psych: normal affect answers questions quickly and appropriately  Extremities: No clubbing or cyanosis.   TELEMETRY: Personally  reviewed, personally, Afib 70-80s     ASSESSMENT AND PLAN: 66 yo with morbid obesity, OHS/OSA, chronic atrial fibrillation with prior CVA was admitted with acute on chronic diastolic CHF.   1. Acute on chronic diastolic CHF with severe pulmonary hypertension and prominent RV failure: She continues to diurese despite transition to po diuretics.  - Weight down nearly 50 lbs from admission, although weights somewhat inaccurate.  - Volume status looks stable. Evening torsemide held yesterday.  - Creatinine slightly better, but BUN trending up slightly. May need lower dose of torsemide chronically. Continue 40 mg BID for now.  - Continue acetazolamide but at lower dose (500 mg daily) with high HCO3. This is trending down.  - Increase spiro to 25 mg daily.   - Supp K.  2. Acute on chronic hypercarbic/hypoxic respiratory failure with OHS/OSA:  followed by Dr. Lake Bells.  Suspected severe OSA but has not yet had sleep study.  On oxygen 5L at home. Has  refused CPAP in past. - ABG with severe CO2 retention with compensated pH. Suggests long-term issue. - She has been using Bipap here, will need to continue at home.  No change.  3. Atrial fibrillation: Chronic, rate controlled she is on Xarelto + atenolol.  - Continue atenolol 50 mg daily.  - No bleeding on Xarelto.  4. 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  PVR 5.7 WU.  Suspect group 2 (elevated LA pressure) and group 3 (OHS/OSA) PH.  However, cannot rule out group 1 component.  She had V/Q scan that was not suggestive of chronic PE and high resolution chest CT that was not suggestive of ILD.  ANA, RF, anti-SCL70 all negative.  - Volume status appears improved, suspect PCWP down.   - Continue Revatio 20 mg tid.  - She will need oxygen during the day and CPAP/BiPAP at night (needs sleep study).  5. Deconditioning: - PT recommending SNF. Continue to mobilize.   Shirley Friar, PA-C  11/15/2016 10:12  AM  Advanced Heart Failure Team Pager [phone] (M-F; 7a - 4p)  Please contact [facility] for night-coverage after hours (4p -7a ) and weekends on [email or web address]  Patient seen with PA, agree with the above note.  Stable, continues to diurese even on po meds, mild volume overload still but much improved.  Renal function unchanged.  Continues to use Bipap at night and oxygen 5 L Cataract during the day.    Continue current sildenafil.  Continue current torsemide 40 mg bid and increase spironolactone to 25 mg daily.  Will decrease acetazolamide to once a day.   Will need SNF.   Loralie Champagne 11/15/2016 11:05 AM

## 2016-11-15 NOTE — Progress Notes (Signed)
CARDIAC REHAB PHASE I   PRE:  Rate/Rhythm: 106 afib  BP:  Supine:   Sitting: 94/57  Standing:    SaO2: 97% 4L   88-89% 4L BSC  MODE:  Ambulation: to door and back to [facility]  ft   POST:  Rate/Rhythm: 110 afib  BP:  Supine:   Sitting: 111/46  Standing:    SaO2: 82% 6L  90% with rest 1345-1422 Pt stated she had not been OOB today. Receptive to walking. Pt has a little difficulty processing instructions. Pt able to stand with little assistance. Desat with activity. Able to walk to door and back to  [facility]  on 6L with rolling walker and asst x 2 without sitting down. To BSC and call light given. Back to 4L after sats improved. Notified RN to get pt to recliner after she finishes with the [facility]. After giving instructions, pt will ask what are we going to do?  and then re enforcement given.   Graylon Good, RN BSN  11/15/2016 2:17 PM

## 2016-11-15 NOTE — Progress Notes (Addendum)
PCCM Progress Note  Admission date: 11/04/2016  CC: Dyspnea  HPI: 66 yo female with progressive dyspnea, cough from acute on chronic diastolic CHF, A fib, OSA/OHS, pulmonary HTN.  Subjective: In bed, no distress She is breathing better Neg 4 liters  Vital signs: BP 91/62 (BP Location: Left Arm)   Pulse (!) 116   Temp 97.5 F (36.4 C) (Oral)   Resp 15   Ht _0  (1.549 m)   Wt 107.9 kg (237 lb 14.4 oz)   LMP 05/06/2013   SpO2 90%   BMI 44.95 kg/m   Intake/output: I/O last 3 completed shifts: In: 530 [P.O.:530] Out: 6900 [Urine:6900]  General: alert Neuro: follows commands well HEENT: Bipap off on reservior [place] O2 delivery Cardiac:  s1 s2 RRirregular, NOT tachy Chest: reduced Abd: soft, non tender Ext: no edema, no rash Skin: venous stasis changes   CMP Latest Ref Rng & Units 11/15/2016 11/14/2016 11/13/2016  Glucose 65 - 99 mg/dL 129(H) 118(H) 103(H)  BUN 6 - 20 mg/dL 48(H) 47(H) 41(H)  Creatinine 0.44 - 1.00 mg/dL 1.41(H) 1.49(H) 1.21(H)  Sodium 135 - 145 mmol/L 137 139 137  Potassium 3.5 - 5.1 mmol/L 3.3(L) 3.7 3.4(L)  Chloride 101 - 111 mmol/L 81(L) 78(L) 80(L)  CO2 22 - 32 mmol/L 45(H) 50(H) 48(H)  Calcium 8.9 - 10.3 mg/dL 9.1 9.3 9.1  Total Protein 6.5 - 8.1 g/dL - - -  Total Bilirubin 0.3 - 1.2 mg/dL - - -  Alkaline Phos 38 - 126 U/L - - -  AST 15 - 41 U/L - - -  ALT 14 - 54 U/L - - -     CBC Latest Ref Rng & Units 11/15/2016 11/14/2016 11/13/2016  WBC 4.0 - 10.5 K/uL 9.9 10.0 8.6  Hemoglobin 12.0 - 15.0 g/dL 10.6(L) 10.7(L) 10.0(L)  Hematocrit 36.0 - 46.0 % 35.7(L) 37.6 33.6(L)  Platelets 150 - 400 K/uL 160 169 145(L)     ABG    Component Value Date/Time   PHART 7.433 11/13/2016 0610   PCO2ART 82.1 (HH) 11/13/2016 0610   PO2ART 88.0 11/13/2016 0610   HCO3 55.2 (H) 11/13/2016 0610   TCO2 >50 11/13/2016 0610   O2SAT 96.0 11/13/2016 0610     CBG (last 3)   Recent Labs  11/14/16 1140 11/14/16 1614 11/14/16 2142  GLUCAP 105* 129* 140*      Imaging: Dg Chest Port 1 View  Result Date: 11/15/2016 CLINICAL DATA:  CHF. EXAM: PORTABLE CHEST 1 VIEW COMPARISON:  11/06/2016. FINDINGS: Cardiomegaly with diffuse bilateral pulmonary infiltrates/edema and bilateral pleural effusions. Findings consistent with congestive heart failure. Slight worsening from prior exam. No pneumothorax. Bilateral shoulder replacements. IMPRESSION: Cardiomegaly with bilateral pulmonary infiltrates most consistent pulmonary edema. Slight worsening from prior exam. Small bilateral pleural effusions again noted. No pneumothorax. Electronically Signed   By: Marcello Moores  Register   On: 11/15/2016 06:58     Studies: ABG 10/06/2016 performed on oxygen > 7.43/53.1/65.0/34.7 PFT's Jan 2018 > FVC 0.88 L, 32% predicted, normal ratio, DLCO 7.18 (35% pred). High Res CT chest 10/06/16 > normal pulmonary parenchyma with the exception of interlobular septal thickening and groundglass worse in the dependent sections of all lobes, there is a moderate size right-sided pleural effusion, pulmonary vascular engorgement. VQ 10/06/16 > No evidence of pulmonary embolism, decreased ventilation and perfusion to the right lower lobe which corresponds to the large effusion on CT Echo Dec 2017 > LVEF 55-60%, PAP 77 Echo 11/06/16 > EF 55-60%, PAP 90 RHC 03/05 > PCWP 63,  LVEDP 28, index 2.15.  Serologies 11/08/16 ANA, RA, Scl 70- Negative HIV 07/23/15- Negative  Assessment/plan:  Acute on chronic hypoxic/hypercapnic respiratory failure. Effusion Int edema OSA/OHS with morbid obesity. - oxygen to keep SpO2 90 to 95% - Bipap OFF daytime PRN, again enforced importance qhs - prn BDs -Again neg 4 liters and she continues ot tolerate this,massive neg balance, chem in am , appreciate cards . chf help  Acute on chronic diastolic CHF. A fib. - continue diamox, tenormin, zaroxlyn, xarelto - per chf service, impressive output tolerated  crt rise - improved with continued neg 4 liters  balance daily  Hx of hypothyroidism, gout, DM. - synthroid, allopurinol - SSI  hypoK on demedex k supp bmet in am   Deconditioning. - mobilize with PT and improving slowly, pt and family asking for rehab needs after cleared medically Will follow PT note   I updated husband To floor   DVT prophylaxis - xarelto SUP - Not indicated Nutrition - Carb modified diet Goals of care - Full code   Lavon Paganini. Titus Mould, MD, [place] Pgr: [place] [facility]

## 2016-11-15 NOTE — Progress Notes (Signed)
Pt transferred to [place] with her sister [name]. All belongings were taken.

## 2016-11-15 NOTE — Progress Notes (Signed)
Patient transferred to 3W 29 accompanied by RN and staff. O2 tank at 4 L in place. Patient's stable.

## 2016-11-15 NOTE — Progress Notes (Signed)
eLink Physician-Brief Progress Note Patient Name: [name] DOB: Jun 17, 1951 MRN: [id number]   Date of Service  11/15/2016  HPI/Events of Note  Informed that nursing can't accept this patient on Nocturnal BiPAP on the 3E telemetry unit the patient will need to be transferred to a 2W or 3W telemetry bed.  eICU Interventions  Will order transfer to 2W or 3W telemetry bed.      Intervention Category Major Interventions: Other:  Lysle Dingwall 11/15/2016, 7:34 PM

## 2016-11-15 NOTE — Care Management Note (Addendum)
Case Management Note  Patient Details  Name: [name] MRN: [id number] Date of Birth: [birth year]  Subjective/Objective:   Pt admitted with acute on chronic resp failure with hypoxemia                Action/Plan:   PTA from home on supplemental oxygen 2-4 liters.  Pt is still requiring HFNC, aggressive treatment with IV lasix in addition to zaroxlyn - may need the addition of milrinone.  IF pt discharges home will need sleep study arranged for potential CPAP.    Will continue to follow   Expected Discharge Date:                  Expected Discharge Plan:  Long Term Acute Care (LTAC)  In-House Referral:     Discharge planning Services  CM Consult  Post Acute Care Choice:    Choice offered to:     DME Arranged:    DME Agency:     HH Arranged:    HH Agency:     Status of Service:  In process, will continue to follow  If discussed at Long Length of Stay Meetings, dates discussed:    Additional Comments: 11/15/2016  Sister Aliene Altes - contacted CM late evening and informed CM that she is also in agreement for [facility] referral - sister also chose [facility]  CM informed by bedside nurse that pt has been experiencing confusion - recommended that CM contact sister Aliene Altes and inform of discharge plan - CM left voicemail.  Discussed in LOS 11/15/16 - pt remains appropriate for continued stay and has also been deemed appropriate for [facility] referral.  CM spoke with attending NP and gained agreement for [facility] referral.  Both agencies given referral during LOS - both offered beds.  CM spoke with pt ; informed of LTACH referral and provided choice between both agencies - pt chose [facility] based on location.  Pt would like for liaison to contact her - liaison informed Maryclare Labrador, RN 11/15/2016, 2:51 PM

## 2016-11-15 NOTE — Progress Notes (Signed)
In reviewing patient orders it was discovered that patient has orders for bipap at bedtime. Patient is not a chronic but new bipap patient and needs to be transferred to receive the appropriate level of care. Phone call made to Dr. Emmit Alexanders and transfer order received.

## 2016-11-15 NOTE — Progress Notes (Signed)
Patient will be transferred to 3W 29 for bipap. Report given to RN prior to discharge.

## 2016-11-16 LAB — BASIC METABOLIC PANEL
Anion gap: 11 (ref 5–15)
BUN: 47 mg/dL — ABNORMAL HIGH (ref 6–20)
CALCIUM: 9.4 mg/dL (ref 8.9–10.3)
CO2: 43 mmol/L — AB (ref 22–32)
Chloride: 81 mmol/L — ABNORMAL LOW (ref 101–111)
Creatinine, Ser: 1.35 mg/dL — ABNORMAL HIGH (ref 0.44–1.00)
GFR calc Af Amer: 46 mL/min — ABNORMAL LOW (ref >60)
GFR, EST NON AFRICAN AMERICAN: 40 mL/min — AB (ref >60)
Glucose, Bld: 124 mg/dL — ABNORMAL HIGH (ref 65–99)
POTASSIUM: 3.4 mmol/L — AB (ref 3.5–5.1)
Sodium: 135 mmol/L (ref 135–145)

## 2016-11-16 LAB — GLUCOSE, CAPILLARY
GLUCOSE-CAPILLARY: 100 mg/dL — AB (ref 65–99)
Glucose-Capillary: 114 mg/dL — ABNORMAL HIGH (ref 65–99)
Glucose-Capillary: 129 mg/dL — ABNORMAL HIGH (ref 65–99)
Glucose-Capillary: 121 mg/dL — ABNORMAL HIGH (ref 65–99)

## 2016-11-16 LAB — PHOSPHORUS: PHOSPHORUS: 3.9 mg/dL (ref 2.5–4.6)

## 2016-11-16 LAB — MAGNESIUM: MAGNESIUM: 1.9 mg/dL (ref 1.7–2.4)

## 2016-11-16 MED ORDER — ACETAZOLAMIDE 250 MG PO TABS
250.0000 mg | ORAL_TABLET | Freq: Every day | ORAL | Status: DC
  Filled 2016-11-16: qty 1

## 2016-11-16 MED ORDER — POTASSIUM CHLORIDE CRYS ER 20 MEQ PO TBCR
40.0000 meq | EXTENDED_RELEASE_TABLET | Freq: Two times a day (BID) | ORAL | Status: DC
  Administered 2016-11-16: 40 meq via ORAL
  Filled 2016-11-16: qty 2

## 2016-11-16 MED ORDER — POTASSIUM CHLORIDE CRYS ER 20 MEQ PO TBCR: 40.0000 meq | EXTENDED_RELEASE_TABLET | Freq: Once | ORAL | Status: DC

## 2016-11-16 NOTE — Care Management Important Message (Signed)
Important Message  Patient Details  Name: [name] MRN: [id number] Date of Birth: December 20, 1950   Medicare Important Message Given:  Yes    Nathen May 11/16/2016, 11:32 AM

## 2016-11-16 NOTE — Consult Note (Signed)
[facility] Primary Care Navigator  11/16/2016  [name] [birth year] [id number]   Met with patient and cousin at the bedside to identify possible discharge needs. Patient reports having shortness of breath that had led to this admission.  Patient endorses Dr. [name] with [facility] [place] as her primary care provider.   Patient shared using [facility] [place] (switching to [facility] location) to obtain medications without any problem.   Patient verbalized managing her own medications at home using "pill box" system every 2 weeks.   She reports living alone, independent with care and being able to drive prior to hospitalization. Her sister [name]) or brother Vonna Drafts) will be able to provide transportation to her doctors' appointments after discharge if needed.  She mentions living alone and independent with self care prior to admission.  Patient hopes to be the primary caregiver for herself after discharging from skilled nursing facility, if not, then she will need a caregiver to assist with care at home as stated.  [facility] list of personal care services provided as her resource when needed, with the understanding that she will pay out of the pocket for it.  Discharge plan is skilled nursing facility (prefers [facility]) for short term rehabilitation prior to returning home according to patient.  Patient voiced understanding to call primary care provider's office once she returns home, for a post discharge follow-up appointment within a week or sooner if needed. Patient letter (with PCP's contact number) was provided as a reminder.  Explained to patient about [facility] CM services available for health management but plans to talk to her doctors on follow-up visit to see if she really needs it. Encouraged patient to get referral to [place] services if deemed appropriate.  [facility] care management contact information provided for future needs that may  arise.     For questions, please contact:  Dannielle Huh, BSN, RN- [facility] Primary Care Navigator  Telephone: [phone] [place]

## 2016-11-16 NOTE — Plan of Care (Signed)
Problem: Activity: Goal: Risk for activity intolerance will decrease Outcome: Progressing Patient gets up to [facility] with assist, patient does desat with activity.

## 2016-11-16 NOTE — Progress Notes (Signed)
RT set up BiPAP for patient per previous settings of 12/6. Patient was placed on BiPAP and stated she wasn't sure how everything felt but would try it. RT called back again to look at patient settings and make sure settings were the same as the night before. RT again looked at previous documentation to find the patient wore BiPAP 12/6 for previous nights. BiPAP was checked again and settings were 12/6. Patient was again placed on the BiPAP but still felt uneasy about it. Patient seemed very anxious and stated "she was unsure about anything." RT instructed patient to call back if she needed further help. RN contacted RT again concerning BiPAP settings. Gwyndolyn Saxon, RRT spoke with patient about settings and explained to her that the settings were the same as the previous night and he also said the patient seemed very anxious. Patient decided to not go on BiPAP at that time and stated she would call when she was ready to be placed back on.

## 2016-11-16 NOTE — Care Management Note (Addendum)
Case Management Note  Patient Details  Name: [name] MRN: [id number] Date of Birth: 01/19/1951  Subjective/Objective: Pt presented as a transfer to [facility] [address]- for acute on chronic respiratory failure. Pt has BIPAP at night and is on 4L HFNC. Pt on all po medications at this time. Plan to d/c to LTAC vs SNF.                     Action/Plan: CM did reach out to Liaison for [facility] and it would take at least 2 days to hear back from insurance to see if pt is approved for LTAC. CM did reach out to CSW in regards to disposition to SNF. Per CSW pt will have to be off high flow 02 in order for SNF bed @ [facility]. Denyse Amass CSW to call [facility] and [facility] to see if they can take HFNC at d/c. RN to try to wean High Flow 02. CM did place call to physician advisor to see if any suggestions as well. CM will continue to monitor.   Expected Discharge Date:                  Expected Discharge Plan:  Skilled Nursing Facility  In-House Referral:  Clinical Social Work  Discharge planning Services  CM Consult  Post Acute Care Choice:  NA Choice offered to:  NA  DME Arranged:  N/A DME Agency:  NA  HH Arranged:  NA HH Agency:  NA  Status of Service:  Completed, signed off  If discussed at [place] of Stay Meetings, dates discussed:    Additional Comments: 1427 11-16-16 Jacqlyn Krauss, RN, BSN [phone] Per Physician Advisor no longer meets criteria for LTAC. CM did speak with CSW and plan will be to d/c to [facility] 11-17-16. CM has a Page out to NP to make aware. No further needs from CM at this time.  Bethena Roys, RN 11/16/2016, 10:44 AM

## 2016-11-16 NOTE — NC FL2 (Signed)
[place] MEDICAID FL2 LEVEL OF CARE SCREENING TOOL     IDENTIFICATION  Patient Name: [name] Birthdate: 09/25/1950 Sex: female Admission Date (Current Location): 11/04/2016  [facility] and [place] Number:  Herbalist and Address:  [facility] [place] [place] [address] [place], [place] [address]      Provider Number: [id number]  Attending Physician Name and Address:  Juanito Doom, MD  Relative Name and Phone Number:       Current Level of Care: Hospital Recommended Level of Care: [place] Prior Approval Number:    Date Approved/Denied:   PASRR Number: [id number]  Discharge Plan: SNF    Current Diagnoses: Patient Active Problem List   Diagnosis Date Noted  . Pulmonary hypertension, primary ([place])   . Aortic atherosclerosis ([place]) 11/06/2016  . Obesity hypoventilation syndrome ([place]) 11/05/2016  . Coronary artery calcification seen on CAT scan 11/05/2016  . Acute on chronic respiratory failure with hypoxemia ([place]) 11/04/2016  . Pleural effusion, right 11/04/2016  . Acute on chronic diastolic congestive heart failure ([place]) 11/04/2016  . Pulmonary hypertension 09/27/2016  . Hypoxemia 09/27/2016  . Gout 01/20/2014  . Abnormal LFTs 05/16/2012  . Vitamin B12 deficiency 05/16/2012  . Hypertensive heart disease   . B12 deficiency anemia 03/26/2012  . Shortness of breath 02/17/2012  . Prediabetes 10/02/2011  . 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 Chronic anticoagulation 06/28/2011  . Chronic atrial fibrillation ([place]) 01/21/2011  . OSA (obstructive sleep apnea) 01/21/2011  . History of stroke 11/19/2010  . DJD (degenerative joint disease) 05/17/2010  . Hyperlipidemia 04/09/2007  . Hypothyroidism 02/28/2007  . Morbid obesity ([place]) 02/28/2007    Orientation RESPIRATION BLADDER Height & Weight     Self, Time, Situation, Place  O2 (CPAP at night and Hi Flow Nasal cannula 4L (will come with tank if needed)) Incontinent,  Indwelling catheter Weight: 103.1 kg (227 lb 4.8 oz) Height:  5' 1" (154.9 cm)  BEHAVIORAL SYMPTOMS/MOOD NEUROLOGICAL BOWEL NUTRITION STATUS      Continent Diet (Please see DC Summary)  AMBULATORY STATUS COMMUNICATION OF NEEDS Skin   Limited Assist Verbally Normal                       Personal Care Assistance Level of Assistance  Bathing, Feeding, Dressing Bathing Assistance: Maximum assistance Feeding assistance: Independent Dressing Assistance: Limited assistance     Functional Limitations Info             SPECIAL CARE FACTORS FREQUENCY  PT (By licensed PT)     PT Frequency: 5x/week              Contractures      Additional Factors Info  Code Status, Allergies, Psychotropic, Insulin Sliding Scale Code Status Info: Full Allergies Info: Ambien Zolpidem Tartrate, Losartan, Prevacid Lansoprazole, Adhesive Tape, Keflex Cephalexin, Motrin Ibuprofen, Prilosec Omeprazole, Ace Inhibitors, Benadryl Diphenhydramine Hcl Psychotropic Info: Ambien Zolpidem Tartrate, Losartan, Prevacid Lansoprazole, Adhesive Tape, Keflex Cephalexin, Motrin Ibuprofen, Prilosec Omeprazole, Ace Inhibitors, Benadryl Diphenhydramine Hcl Insulin Sliding Scale Info: 3x daily with meals       Current Medications (11/16/2016):  This is the current hospital active medication list Current Facility-Administered Medications  Medication Dose Route Frequency Provider Last Rate Last Dose  . 0.9 %  sodium chloride infusion  250 mL Intravenous PRN Juanito Doom, MD   Stopped at 11/13/16 1800  . acetaZOLAMIDE (DIAMOX) tablet 500 mg  500 mg Oral Daily Larey Dresser, MD      .  allopurinol (ZYLOPRIM) tablet 200 mg  200 mg Oral Daily Juanito Doom, MD   200 mg at 11/16/16 0847  . atenolol (TENORMIN) tablet 50 mg  50 mg Oral Daily Theone Murdoch Hammons, RPH   50 mg at 11/15/16 1522  . docusate sodium (COLACE) capsule 100 mg  100 mg Oral BID Raylene Miyamoto, MD   100 mg at 11/16/16 0843  . insulin aspart  (novoLOG) injection 0-15 Units  0-15 Units Subcutaneous TID WC Raylene Miyamoto, MD   2 Units at 11/15/16 1524  . insulin aspart (novoLOG) injection 0-5 Units  0-5 Units Subcutaneous QHS Raylene Miyamoto, MD      . levalbuterol [facility]) nebulizer solution 0.63 mg  0.63 mg Nebulization Q2H PRN Chesley Mires, MD      . levothyroxine (SYNTHROID, LEVOTHROID) tablet 75 mcg  75 mcg Oral QAC breakfast Juanito Doom, MD   Stopped at 11/16/16 [phone]  . methocarbamol (ROBAXIN) tablet 250 mg  250 mg Oral Q6H PRN Colbert Coyer, MD   250 mg at 11/08/16 1654  . ondansetron (ZOFRAN) injection 4 mg  4 mg Intravenous Q6H PRN Juanito Doom, MD      . potassium chloride SA (K-DUR,KLOR-CON) CR tablet 60 mEq  60 mEq Oral Daily Raylene Miyamoto, MD   60 mEq at 11/16/16 0845  . rivaroxaban (XARELTO) tablet 20 mg  20 mg Oral Q supper Leonie Man, MD   20 mg at 11/15/16 1730  . senna (SENOKOT) tablet 8.6 mg  1 tablet Oral Daily Raylene Miyamoto, MD   8.6 mg at 11/16/16 0843  . sildenafil (REVATIO) tablet 20 mg  20 mg Oral TID Larey Dresser, MD   20 mg at 11/15/16 2257  . sodium chloride flush (NS) 0.9 % injection 3 mL  3 mL Intravenous Q12H Leonie Man, MD   3 mL at 11/16/16 0852  . sodium chloride flush (NS) 0.9 % injection 3 mL  3 mL Intravenous PRN Leonie Man, MD      . spironolactone (ALDACTONE) tablet 25 mg  25 mg Oral Daily Satira Mccallum Tillery, PA-C      . torsemide [facility]) tablet 40 mg  40 mg Oral BID Larey Dresser, MD   40 mg at 11/15/16 1730  . traMADol (ULTRAM) tablet 50 mg  50 mg Oral Q8H PRN Colbert Coyer, MD   50 mg at 11/15/16 2037     Discharge Medications: Please see discharge summary for a list of discharge medications.  Relevant Imaging Results:  Relevant Lab Results:   Additional Information SSN: [place] Chrishon Martino, LCSWA

## 2016-11-16 NOTE — Progress Notes (Addendum)
Update: [facility] is reviewing patient. They are not certain that pt is stable enough for SNF yet and may be better served at [facility]. They will update CSW with final decision.   CSW following patient for possible SNF placement. Patient not able to discharge to SNF unless off of high flow oxygen. If patient able to tolerate regular nasal cannula less than 6L, patient may dc to [facility] SNF Thursday.  Patient will be able to have CPAP at SNF.   [name] LCSWA [phone]

## 2016-11-16 NOTE — Progress Notes (Signed)
PCCM Progress Note  Admission date: 11/04/2016  CC: Dyspnea  HPI: 66 yo female with progressive dyspnea, cough from acute on chronic diastolic CHF, A fib, OSA/OHS, pulmonary HTN.  Subjective: Negative 4 liters again from yesterday. Subjectively feeling good today. Could not tolerate BiPAP last night. She felt as though settings were different than they had been. RT reports they were not.   Vital signs: BP 98/66 (BP Location: Left Arm)   Pulse 74   Temp 97.6 F (36.4 C) (Oral)   Resp 20   Ht _0  (1.549 m)   Wt 103.1 kg (227 lb 4.8 oz)   LMP 05/06/2013   SpO2 98%   BMI 42.95 kg/m   Intake/output: I/O last 3 completed shifts: In: 88 [P.O.:530] Out: 6575 [Urine:6575]  General: obese female in NAD Neuro: Alert, oriented, non-focal HEENT: [place]/AT, PERRL Cardiac:  IRIR, no MRG Chest: mild bibasilar crackles Abd: soft, non-tender, non-distended Ext: no acute deformity or edema  Skin: Venous statis changes. Ecchymosis to BUE.   CMP Latest Ref Rng & Units 11/16/2016 11/15/2016 11/15/2016  Glucose 65 - 99 mg/dL 124(H) 131(H) 129(H)  BUN 6 - 20 mg/dL 47(H) 45(H) 48(H)  Creatinine 0.44 - 1.00 mg/dL 1.35(H) 1.43(H) 1.41(H)  Sodium 135 - 145 mmol/L 135 135 137  Potassium 3.5 - 5.1 mmol/L 3.4(L) 4.0 3.3(L)  Chloride 101 - 111 mmol/L 81(L) 80(L) 81(L)  CO2 22 - 32 mmol/L 43(H) 41(H) 45(H)  Calcium 8.9 - 10.3 mg/dL 9.4 9.5 9.1  Total Protein 6.5 - 8.1 g/dL - - -  Total Bilirubin 0.3 - 1.2 mg/dL - - -  Alkaline Phos 38 - 126 U/L - - -  AST 15 - 41 U/L - - -  ALT 14 - 54 U/L - - -     CBC Latest Ref Rng & Units 11/15/2016 11/15/2016 11/14/2016  WBC 4.0 - 10.5 K/uL 13.4(H) 9.9 10.0  Hemoglobin 12.0 - 15.0 g/dL 11.4(L) 10.6(L) 10.7(L)  Hematocrit 36.0 - 46.0 % 38.3 35.7(L) 37.6  Platelets 150 - 400 K/uL 200 160 169     ABG    Component Value Date/Time   PHART 7.433 11/13/2016 0610   PCO2ART 82.1 (HH) 11/13/2016 0610   PO2ART 88.0 11/13/2016 0610   HCO3 55.2 (H) 11/13/2016  0610   TCO2 >50 11/13/2016 0610   O2SAT 96.0 11/13/2016 0610     CBG (last 3)   Recent Labs  11/15/16 1201 11/15/16 1603 11/16/16 0732  GLUCAP 121* 120* 100*     Imaging: Dg Chest Port 1 View  Result Date: 11/15/2016 CLINICAL DATA:  CHF. EXAM: PORTABLE CHEST 1 VIEW COMPARISON:  11/06/2016. FINDINGS: Cardiomegaly with diffuse bilateral pulmonary infiltrates/edema and bilateral pleural effusions. Findings consistent with congestive heart failure. Slight worsening from prior exam. No pneumothorax. Bilateral shoulder replacements. IMPRESSION: Cardiomegaly with bilateral pulmonary infiltrates most consistent pulmonary edema. Slight worsening from prior exam. Small bilateral pleural effusions again noted. No pneumothorax. Electronically Signed   By: Marcello Moores  Register   On: 11/15/2016 06:58     Studies: ABG 10/06/2016 performed on oxygen > 7.43/53.1/65.0/34.7 PFT's Jan 2018 > FVC 0.88 L, 32% predicted, normal ratio, DLCO 7.18 (35% pred). High Res CT chest 10/06/16 > normal pulmonary parenchyma with the exception of interlobular septal thickening and groundglass worse in the dependent sections of all lobes, there is a moderate size right-sided pleural effusion, pulmonary vascular engorgement. VQ 10/06/16 > No evidence of pulmonary embolism, decreased ventilation and perfusion to the right lower lobe which corresponds to the  large effusion on CT Echo Dec 2017 > LVEF 55-60%, PAP 77 Echo 11/06/16 > EF 55-60%, PAP 90 RHC 03/05 > PCWP 63, LVEDP 28, index 2.15.  Serologies 11/08/16 ANA, RA, Scl 70- Negative HIV 07/23/15- Negative  Assessment/plan:  Acute on chronic hypoxic/hypercapnic respiratory failure. Effusion Interstitial edema OSA/OHS with morbid obesity. - Continue supplemental O2 to keep sats 88-95% - BiPAP QHS and PRN. Stressed importance of BiPAP QHS. - bronchodilators as needed - Diuresing well under direction of the AHF team   Acute on chronic diastolic CHF. A fib. -  continue diamox, tenormin, lasix, zaroxlyn, xarelto - per chf service  crt rise: improving - gentle diuresis  Hypokalemia - Supp K  Hx of hypothyroidism, gout, DM. - synthroid, allopurinol - SSI  Acute encephalopathy: likely from hypercapnia. Improved 3/11  Deconditioning. - mobilize with PT  DVT prophylaxis - xarelto SUP - Not indicated Nutrition - Carb modified diet Goals of care - Full code   Ready for discharge to SNF vs LTACH next 24-48 hours.   Georgann Housekeeper, AGACNP-BC [facility] Pager [phone] or [phone]  11/16/2016 8:30 AM  STAFF NOTE: Linwood Dibbles, MD FACP have personally reviewed patient's available data, including medical history, events of note, physical examination and test results as part of my evaluation. I have discussed with resident/NP and other care providers such as pharmacist, RN and RRT. In addition, I personally evaluated patient and elicited key findings of: alert, sitting edge bed with PT at bedside, lungs clear, neg 4 liters again on diuretic, k supp, chem in am , aait recs pt, she had an issue with last nights BIPAP settings, will call RT to investigate early in week the settings she did tolerate and replicate those may also use autoset, I updated pt and family in [place]. Titus Mould, MD, [place] Pgr: [facility] 11/16/2016 2:17 PM

## 2016-11-16 NOTE — Plan of Care (Signed)
Problem: Safety: Goal: Ability to remain free from injury will improve Outcome: Progressing Fall risk bundle in place. Call light within reach and pt demonstrated use. Pt aware to call phone or push call light for any needs or concerns. Bed alarm on and bed in lowest position. Will continue to perform hourly rounding and monitor pt.

## 2016-11-16 NOTE — Clinical Social Work Note (Signed)
Clinical Social Work Assessment  Patient Details  Name: [name] MRN: [id number] Date of Birth: 01-15-1951  Date of referral:  11/16/16               Reason for consult:  Facility Placement                Permission sought to share information with:  Facility Sport and exercise psychologist, Family Supports Permission granted to share information::  Yes, Verbal Permission Granted  Name::     Engineer, materials::  SNFs  Relationship::  Sister  Contact Information:  [phone]  Housing/Transportation Living arrangements for the past 2 months:  Single Family Home Source of Information:  Patient Patient Interpreter Needed:  None Criminal Activity/Legal Involvement Pertinent to Current Situation/Hospitalization:  No - Comment as needed Significant Relationships:  Siblings, Adult Children Lives with:  Self Do you feel safe going back to the place where you live?  No Need for family participation in patient care:  No (Coment)  Care giving concerns:  CSW received consult for possible SNF placement at time of discharge. CSW spoke with patient regarding PT recommendation of SNF placement at time of discharge. Patient reported that patient is currently unable to care for herself at home given patient's current physical needs and fall risk. Patient expressed understanding of PT recommendation and is agreeable to SNF placement at time of discharge if she is unable to go to [facility]. CSW to continue to follow and assist with discharge planning needs.   Social Worker assessment / plan:  CSW spoke with patient concerning possibility of rehab at [facility] before returning home.  Employment status:  Retired Nurse, adult PT Recommendations:  [place] / Referral to community resources:  [place], Other (Comment Required) (LTACH)  Patient/Family's Response to care:  Patient recognizes need for rehab before returning home and is agreeable to a SNF  in [place]. Patient reported [facility] for [facility].  Patient/Family's Understanding of and Emotional Response to Diagnosis, Current Treatment, and Prognosis:  Patient/family is realistic regarding therapy needs and expressed being hopeful for SNF placement. Patient expressed understanding of CSW role and discharge process and she wants the best possible care since she hasn't been breathing well. No questions/concerns about plan or treatment.    Emotional Assessment Appearance:  Appears stated age Attitude/Demeanor/Rapport:  Other (Appropriate) Affect (typically observed):  Accepting, Appropriate Orientation:  Oriented to Self, Oriented to Place, Oriented to  Time, Oriented to Situation Alcohol / Substance use:  Not Applicable Psych involvement (Current and /or in the community):  No (Comment)  Discharge Needs  Concerns to be addressed:  Care Coordination Readmission within the last 30 days:  No Current discharge risk:  None Barriers to Discharge:  Continued Medical Work up   [employer], [place] 11/16/2016, 11:29 AM

## 2016-11-16 NOTE — Progress Notes (Addendum)
Patient ID: [name], female   DOB: 1950-10-10, 66 y.o.   MRN: [id number] P  SUBJECTIVE:   Cardiac rehab following.  PT recommending SNF vs LTAC  Feeling OK this am.  Feeling a little "out of it" with all the information she has been getting. Denies SOB.  Continues to have great urine output.     Out 4.1L though weight shows up 1 lb. Weighed in bed this am.  Out 36 L total this admission.    ABG 7.43/82/88/96% on 4L  RHC 11/07/16 TPG 50-57m Hg PAP mean: 55 mmHg PCWP: 63 mmHg (not accurate).  LVEDP: 28.  PVR using LVEDP and mean PA 5.7 WU.  Cardiac output/index by Fick: 4.74/2.15. RV pressures/EDP: 97/13/21 mmHg Shunt run showed no left to right shunt.   Scheduled Meds: . acetaZOLAMIDE  500 mg Oral Daily  . allopurinol  200 mg Oral Daily  . atenolol  50 mg Oral Daily  . docusate sodium  100 mg Oral BID  . insulin aspart  0-15 Units Subcutaneous TID WC  . insulin aspart  0-5 Units Subcutaneous QHS  . levothyroxine  75 mcg Oral QAC breakfast  . potassium chloride  60 mEq Oral Daily  . rivaroxaban  20 mg Oral Q supper  . senna  1 tablet Oral Daily  . sildenafil  20 mg Oral TID  . sodium chloride flush  3 mL Intravenous Q12H  . spironolactone  25 mg Oral Daily  . torsemide  40 mg Oral BID   Continuous Infusions: PRN Meds:.sodium chloride, levalbuterol, methocarbamol, ondansetron (ZOFRAN) IV, sodium chloride flush, traMADol    Vitals:   11/15/16 2115 11/15/16 2349 11/16/16 0547 11/16/16 1117  BP:   98/66 (!) 101/56  Pulse: (!) 127 85 74 82  Resp:  20 20   Temp:   97.6 F (36.4 C)   TempSrc:   Oral   SpO2:  93% 98%   Weight:   227 lb 4.8 oz (103.1 kg)   Height:        Intake/Output Summary (Last 24 hours) at 11/16/16 1137 Last data filed at 11/16/16 0852  Gross per 24 hour  Intake              583 ml  Output             4550 ml  Net            -3967 ml    LABS: Basic Metabolic Panel:  Recent Labs  11/15/16 0325 11/15/16 1034 11/15/16 1954  11/16/16 0400  NA 137  --  135 135  K 3.3*  --  4.0 3.4*  CL 81*  --  80* 81*  CO2 45*  --  41* 43*  GLUCOSE 129*  --  131* 124*  BUN 48*  --  45* 47*  CREATININE 1.41*  --  1.43* 1.35*  CALCIUM 9.1  --  9.5 9.4  MG 1.9  --   --  1.9  PHOS  --  4.6  --  3.9   Liver Function Tests: No results for input(s): AST, ALT, ALKPHOS, BILITOT, PROT, ALBUMIN in the last 72 hours. No results for input(s): LIPASE, AMYLASE in the last 72 hours. CBC:  Recent Labs  11/15/16 0325 11/15/16 1954  WBC 9.9 13.4*  NEUTROABS 7.7  --   HGB 10.6* 11.4*  HCT 35.7* 38.3  MCV 94.7 94.8  PLT 160 200   Cardiac Enzymes: No results for input(s): CKTOTAL, CKMB, CKMBINDEX, TROPONINI in  the last 72 hours. BNP: Invalid input(s): POCBNP D-Dimer: No results for input(s): DDIMER in the last 72 hours. Hemoglobin A1C: No results for input(s): HGBA1C in the last 72 hours. Fasting Lipid Panel: No results for input(s): CHOL, HDL, LDLCALC, TRIG, CHOLHDL, LDLDIRECT in the last 72 hours. Thyroid Function Tests: No results for input(s): TSH, T4TOTAL, T3FREE, THYROIDAB in the last 72 hours.  Invalid input(s): FREET3 Anemia Panel: No results for input(s): VITAMINB12, FOLATE, FERRITIN, TIBC, IRON, RETICCTPCT in the last 72 hours.  RADIOLOGY: Dg Chest Port 1 View  Result Date: 11/15/2016 CLINICAL DATA:  CHF. EXAM: PORTABLE CHEST 1 VIEW COMPARISON:  11/06/2016. FINDINGS: Cardiomegaly with diffuse bilateral pulmonary infiltrates/edema and bilateral pleural effusions. Findings consistent with congestive heart failure. Slight worsening from prior exam. No pneumothorax. Bilateral shoulder replacements. IMPRESSION: Cardiomegaly with bilateral pulmonary infiltrates most consistent pulmonary edema. Slight worsening from prior exam. Small bilateral pleural effusions again noted. No pneumothorax. Electronically Signed   By: Marcello Moores  Register   On: 11/15/2016 06:58   Dg Chest Port 1 View  Result Date: 11/07/2016 CLINICAL DATA:   Hypoxemia EXAM: PORTABLE CHEST 1 VIEW COMPARISON:  11/04/2016 CXR, chest CT 10/06/2016 FINDINGS: Low lung volumes with crowding of interstitial lung markings. Bilateral pleural effusions right greater than left. Mild vascular congestion is noted though decreased since prior exam. Bilateral shoulder arthroplasties are present. No acute osseous abnormality. IMPRESSION: Cardiomegaly with bilateral pleural effusions right slightly greater than left. Mild vascular congestion. Electronically Signed   By: Ashley Royalty M.D.   On: 11/07/2016 00:10   Dg Chest Port 1 View  Result Date: 11/04/2016 CLINICAL DATA:  66 year old female with chronic respiratory failure, likely pulmonary hypertension. Admitted today for Acute on chronic respiratory failure, possibly acute decompensated heart failure. Initial encounter. EXAM: PORTABLE CHEST 1 VIEW COMPARISON:  Chest radiographs 10/06/2016 and earlier. High-resolution chest CT 10/06/2016. FINDINGS: Portable AP upright view at 1632 hours. Stable cardiomegaly and mediastinal contours. Chronic retrocardiac hypoventilation. Veiling opacity at the right lung base has not significantly changed and there was a moderate size right layering pleural effusion on the 10/06/2016 CT. Associated right perihilar atelectasis. No superimposed pneumothorax. No overt pulmonary edema. IMPRESSION: Suspect stable chest since the chest CT on 10/06/2016; moderate size right pleural effusion and bibasilar atelectasis in the setting of chronic cardiomegaly. Electronically Signed   By: Genevie Ann M.D.   On: 11/04/2016 16:55    PHYSICAL EXAM General: Elderly and fatigued appearing, lying in bed.    Neck: JVP 7-8 cm. No thyromegaly or thyroid nodule.  Lungs: Slight diminished basilar sounds, otherwise clear currently.  CV: Nondisplaced PMI.  Heart irregular. S1/S2, no S3/S4, no murmur. +RV lift  Trace ankle edema with chronic venous stasis changes.  Abdomen: Obese, soft, NT, ND, no HSM. No bruits or masses.  +BS  Neurologic: A/O x 3. Moves all 4 extremities without difficulty.   Psych: Affect flat but appropriate.   Extremities: No clubbing or cyanosis.   TELEMETRY: Personally reviewed, Afib 64s      ASSESSMENT AND PLAN: 66 yo with morbid obesity, OHS/OSA, chronic atrial fibrillation with prior CVA was admitted with acute on chronic diastolic CHF.   1. Acute on chronic diastolic CHF with severe pulmonary hypertension and prominent RV failure: She continues to diurese despite transition to po diuretics.  - Weight down nearly 50 lbs from admission. ? Accuracy of weights with bed weights.  - Volume status stable on po torsemide. Continue torsemide 40 mg BID - BUN and creatinine relatively stable.  -  Continue acetazolamide but decrease to 250 mg daily. Will continue for home/SNF.   - continue spiro 25 mg daily. Supp K.    2. Acute on chronic hypercarbic/hypoxic respiratory failure with OHS/OSA:  followed by Dr. [place] Bells.  Suspected severe OSA but has not yet had sleep study.  On oxygen 5L at home. Has refused CPAP in past. - ABG with severe CO2 retention with compensated pH. Suggests long-term issue. - She has been using Bipap here, will need to continue at home.  No change.  3. Atrial fibrillation: Chronic, rate controlled she is on Xarelto + atenolol.  - Continue atenolol 50 mg daily.  - No bleeding on Xarelto.  4. 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  PVR 5.7 WU.  Suspect group 2 (elevated LA pressure) and group 3 (OHS/OSA) PH.  However, cannot rule out group 1 component.  She had V/Q scan that was not suggestive of chronic PE and high resolution chest CT that was not suggestive of ILD.  ANA, RF, anti-SCL70 all negative.  - Volume status appears improved, suspect PCWP down.   - Continue Revatio 20 mg tid.  - She will need oxygen during the day and CPAP/BiPAP at night (needs sleep study).  - Continue current torsemide.  5. Deconditioning: - PT  recommending SNF. Continue to mobilize.   [place] for home in next 24-48 hrs from HF perspective.   Will schedule HF follow up.   HF meds for home to be Torsemide 40 mg BID Potassium 40 meq BID Spironolactone 25 mg daily Acetazolamide 250 mg daily Atenolol 50 mg daily Revatio 20 mg TID Xarelto 20 mg daily  Annamaria Helling  11/16/2016 11:37 AM  Advanced Heart Failure Team Pager [phone] (M-F; 7a - 4p)  Please contact [facility] for night-coverage after hours (4p -7a ) and weekends on [email or web address]  Patient seen with PA, agree with the above note.  Volume status looks much improved.  Would continue torsemide 40 mg bid and decrease acetazolamide to 250 mg daily.    Has OHS/OSA, will need bipap at night and oxygen during the day.    Will continue on Revatio given concern for a component of group 1 PH.   Needs SNF for PT.  Think she should be ready to leave in the morning.   Loralie Champagne 11/16/2016 12:25 PM

## 2016-11-16 NOTE — Progress Notes (Signed)
Physical Therapy Treatment Patient Details Name: [name] MRN: [id number] DOB: 1951/02/04 Today's Date: 11/16/2016    History of Present Illness Patient is a 66 yo female admitted 11/04/16 with acute on chronic resp failure with hypoxemia.  Patient had RHC showing severe pulmonary HTN.   PMH:  stroke, diastolic heart failure, atrial fibrillation. She also has obstructive sleep apnea and is status post U P3 surgery, s/p Lt reverse TSA 04/29/16.    PT Comments    Patient continues to be limited with activity tolerance but making gradual gains. Pt able to ambulate 22 ft with rw but distance limited by fatigue and reports of back pain. O2 sats dropping to 83% on 4L and returning to 91% on 6L during ambulation. Upon sitting, O2 sats back to 90s on 4L. Based upon the patient's current mobility, recommending to SNF following acute stay.   Follow Up Recommendations  SNF;Supervision for mobility/OOB     Equipment Recommendations  None recommended by PT    Recommendations for Other Services       Precautions / Restrictions Precautions Precautions: Fall Precaution Comments: knees buckle due to weakness Restrictions Weight Bearing Restrictions: No    Mobility  Bed Mobility Overal bed mobility: Needs Assistance Bed Mobility: Supine to Sit     Supine to sit: Min assist     General bed mobility comments: HOB elevated, needing additional time and encouragement  Transfers Overall transfer level: Needs assistance Equipment used: Rolling walker (2 wheeled) Transfers: Sit to/from Stand Sit to Stand: Mod assist;+2 physical assistance         General transfer comment: cues for hand placement, slow transition to full standing.   Ambulation/Gait Ambulation/Gait assistance: Min assist Ambulation Distance (Feet): 22 Feet Assistive device: Rolling walker (2 wheeled) Gait Pattern/deviations: Step-through pattern;Decreased stride length;Trunk flexed Gait velocity: slow pattern    General Gait Details: Pt leaning heavily on rw and leaning onto her elbows despite cues. Initially on 4L O2 but sats droping to 83%, increased to 6L with sats increasing to low 90s. Upon return to sitting, pt returned to 4L and O2 sats in 90s. Cues for breathing provided during ambulation.    Stairs            Wheelchair Mobility    Modified Rankin (Stroke Patients Only)       Balance Overall balance assessment: Needs assistance Sitting-balance support: Feet supported Sitting balance-Leahy Scale: Fair     Standing balance support: Bilateral upper extremity supported Standing balance-Leahy Scale: Poor Standing balance comment: heavy reliance on RW for UE support                    Cognition Arousal/Alertness: Awake/alert Behavior During Therapy: WFL for tasks assessed/performed Overall Cognitive Status: History of cognitive impairments - at baseline                      Exercises      General Comments General comments (skin integrity, edema, etc.): Cues needed for safety throughout ambulation, bending forward and leaning elbows on rw. Due to fatigue, having to bring chair from behind for pt safety.       Pertinent Vitals/Pain Pain Assessment: Faces Faces Pain Scale: Hurts little more Pain Location: back Pain Descriptors / Indicators: Aching;Sore Pain Intervention(s): Limited activity within patient's tolerance;Monitored during session;Repositioned    Home Living                      Prior Function  PT Goals (current goals can now be found in the care plan section) Acute Rehab PT Goals Patient Stated Goal: get out of bed PT Goal Formulation: With patient Time For Goal Achievement: 11/25/16 Potential to Achieve Goals: Good Progress towards PT goals: Progressing toward goals    Frequency    Min 3X/week      PT Plan Current plan remains appropriate    Co-evaluation             End of Session Equipment  Utilized During Treatment: Gait belt;Oxygen Activity Tolerance: Patient limited by fatigue Patient left: in chair;with call bell/phone within reach;with family/visitor present (pt refusing LE elevation) Nurse Communication: Mobility status PT Visit Diagnosis: Muscle weakness (generalized) (M62.81);Difficulty in walking, not elsewhere classified (R26.2)     Time: 6681-5947 PT Time Calculation (min) (ACUTE ONLY): 33 min  Charges:  $Gait Training: 8-22 mins $Therapeutic Activity: 8-22 mins                    G Codes:       Cassell Clement, PT, CSCS Pager [phone] Office [phone]  11/16/2016, 2:16 PM

## 2016-11-16 NOTE — Progress Notes (Signed)
Responded to assist pt. With bipap. Pt. States she felt like her settings were not the same as the previous night. RT x2 checked the pt. Machine and assured the pt. That her settings were the same. Pt. Appeared anxious and decided to not wear the bipap at this time. RT informed the pt.to notify if she changes her mind. RT made RN aware. RN stated she would continue to monitor the pt.

## 2016-11-16 NOTE — Clinical Social Work Note (Signed)
CSW spoke with [facility]. Per [facility] pt is still sick and MD trying to determine between SNF and LTAC. [facility]TA states they are sending pt's clinicals to their MD to review. [facility] stated they should have a decision by end of 3/14. CSW anticipates a authorization for d/c to [facility] on 3/15 (per MD note).   Per Claiborne Billings at [facility] they have ordered the Bipap machine for pt. [facility] prepared to admit pt on 3/15.  [address] [place]

## 2016-11-17 DIAGNOSIS — Z7901 Long term (current) use of anticoagulants: Secondary | ICD-10-CM | POA: Diagnosis not present

## 2016-11-17 DIAGNOSIS — J9621 Acute and chronic respiratory failure with hypoxia: Secondary | ICD-10-CM | POA: Diagnosis not present

## 2016-11-17 DIAGNOSIS — Z8673 Personal history of transient ischemic attack (TIA), and cerebral infarction without residual deficits: Secondary | ICD-10-CM | POA: Diagnosis not present

## 2016-11-17 DIAGNOSIS — R262 Difficulty in walking, not elsewhere classified: Secondary | ICD-10-CM | POA: Diagnosis not present

## 2016-11-17 DIAGNOSIS — D519 Vitamin B12 deficiency anemia, unspecified: Secondary | ICD-10-CM | POA: Diagnosis not present

## 2016-11-17 DIAGNOSIS — E118 Type 2 diabetes mellitus with unspecified complications: Secondary | ICD-10-CM | POA: Diagnosis not present

## 2016-11-17 DIAGNOSIS — I4891 Unspecified atrial fibrillation: Secondary | ICD-10-CM | POA: Diagnosis not present

## 2016-11-17 DIAGNOSIS — M1990 Unspecified osteoarthritis, unspecified site: Secondary | ICD-10-CM | POA: Diagnosis not present

## 2016-11-17 DIAGNOSIS — M6281 Muscle weakness (generalized): Secondary | ICD-10-CM | POA: Diagnosis not present

## 2016-11-17 DIAGNOSIS — R278 Other lack of coordination: Secondary | ICD-10-CM | POA: Diagnosis not present

## 2016-11-17 DIAGNOSIS — I2510 Atherosclerotic heart disease of native coronary artery without angina pectoris: Secondary | ICD-10-CM | POA: Diagnosis not present

## 2016-11-17 DIAGNOSIS — G4733 Obstructive sleep apnea (adult) (pediatric): Secondary | ICD-10-CM | POA: Diagnosis not present

## 2016-11-17 DIAGNOSIS — E039 Hypothyroidism, unspecified: Secondary | ICD-10-CM | POA: Diagnosis not present

## 2016-11-17 DIAGNOSIS — M109 Gout, unspecified: Secondary | ICD-10-CM | POA: Diagnosis not present

## 2016-11-17 DIAGNOSIS — J9601 Acute respiratory failure with hypoxia: Secondary | ICD-10-CM | POA: Diagnosis not present

## 2016-11-17 DIAGNOSIS — E119 Type 2 diabetes mellitus without complications: Secondary | ICD-10-CM | POA: Diagnosis not present

## 2016-11-17 DIAGNOSIS — I272 Pulmonary hypertension, unspecified: Secondary | ICD-10-CM | POA: Diagnosis not present

## 2016-11-17 DIAGNOSIS — J9600 Acute respiratory failure, unspecified whether with hypoxia or hypercapnia: Secondary | ICD-10-CM | POA: Diagnosis not present

## 2016-11-17 DIAGNOSIS — G4730 Sleep apnea, unspecified: Secondary | ICD-10-CM | POA: Diagnosis not present

## 2016-11-17 DIAGNOSIS — I482 Chronic atrial fibrillation: Secondary | ICD-10-CM | POA: Diagnosis not present

## 2016-11-17 DIAGNOSIS — E662 Morbid (severe) obesity with alveolar hypoventilation: Secondary | ICD-10-CM | POA: Diagnosis not present

## 2016-11-17 DIAGNOSIS — G5730 Lesion of lateral popliteal nerve, unspecified lower limb: Secondary | ICD-10-CM | POA: Diagnosis not present

## 2016-11-17 DIAGNOSIS — R0902 Hypoxemia: Secondary | ICD-10-CM | POA: Diagnosis not present

## 2016-11-17 DIAGNOSIS — I5033 Acute on chronic diastolic (congestive) heart failure: Secondary | ICD-10-CM | POA: Diagnosis not present

## 2016-11-17 DIAGNOSIS — E785 Hyperlipidemia, unspecified: Secondary | ICD-10-CM | POA: Diagnosis not present

## 2016-11-17 LAB — CBC
HEMATOCRIT: 38.1 % (ref 36.0–46.0)
Hemoglobin: 11.6 g/dL — ABNORMAL LOW (ref 12.0–15.0)
MCH: 28.4 pg (ref 26.0–34.0)
MCHC: 30.4 g/dL (ref 30.0–36.0)
MCV: 93.4 fL (ref 78.0–100.0)
PLATELETS: 241 10*3/uL (ref 150–400)
RBC: 4.08 MIL/uL (ref 3.87–5.11)
RDW: 19.2 % — AB (ref 11.5–15.5)
WBC: 11.7 10*3/uL — AB (ref 4.0–10.5)

## 2016-11-17 LAB — PHOSPHORUS: PHOSPHORUS: 4.3 mg/dL (ref 2.5–4.6)

## 2016-11-17 LAB — URINALYSIS, ROUTINE W REFLEX MICROSCOPIC
Bacteria, UA: NONE SEEN
Bilirubin Urine: NEGATIVE
GLUCOSE, UA: NEGATIVE mg/dL
KETONES UR: NEGATIVE mg/dL
Nitrite: POSITIVE — AB
PROTEIN: 30 mg/dL — AB
Specific Gravity, Urine: 1.015 (ref 1.005–1.030)
pH: 7.5 (ref 5.0–8.0)

## 2016-11-17 LAB — BASIC METABOLIC PANEL
Anion gap: 12 (ref 5–15)
BUN: 52 mg/dL — ABNORMAL HIGH (ref 6–20)
CALCIUM: 9.8 mg/dL (ref 8.9–10.3)
CO2: 43 mmol/L — AB (ref 22–32)
Chloride: 78 mmol/L — ABNORMAL LOW (ref 101–111)
Creatinine, Ser: 1.62 mg/dL — ABNORMAL HIGH (ref 0.44–1.00)
GFR, EST AFRICAN AMERICAN: 37 mL/min — AB (ref >60)
GFR, EST NON AFRICAN AMERICAN: 32 mL/min — AB (ref >60)
Glucose, Bld: 112 mg/dL — ABNORMAL HIGH (ref 65–99)
POTASSIUM: 3.8 mmol/L (ref 3.5–5.1)
Sodium: 133 mmol/L — ABNORMAL LOW (ref 135–145)

## 2016-11-17 LAB — GLUCOSE, CAPILLARY
GLUCOSE-CAPILLARY: 113 mg/dL — AB (ref 65–99)
GLUCOSE-CAPILLARY: 138 mg/dL — AB (ref 65–99)
Glucose-Capillary: 116 mg/dL — ABNORMAL HIGH (ref 65–99)

## 2016-11-17 LAB — MAGNESIUM: Magnesium: 2 mg/dL (ref 1.7–2.4)

## 2016-11-17 MED ORDER — AMOXICILLIN-POT CLAVULANATE 500-125 MG PO TABS
1.0000 | ORAL_TABLET | Freq: Two times a day (BID) | ORAL | Status: DC
  Administered 2016-11-17: 500 mg via ORAL
  Filled 2016-11-17 (×3): qty 1

## 2016-11-17 MED ORDER — AMOXICILLIN-POT CLAVULANATE 500-125 MG PO TABS: 500.0000 mg | ORAL_TABLET | Freq: Two times a day (BID) | ORAL | 0 refills | Status: DC

## 2016-11-17 MED ORDER — POTASSIUM CHLORIDE ER 20 MEQ PO TBCR: 40.0000 meq | EXTENDED_RELEASE_TABLET | Freq: Every day | ORAL | Status: DC

## 2016-11-17 MED ORDER — SPIRONOLACTONE 25 MG PO TABS: 25.0000 mg | ORAL_TABLET | Freq: Every day | ORAL | Status: DC

## 2016-11-17 MED ORDER — TORSEMIDE 20 MG PO TABS: ORAL_TABLET | ORAL | Status: DC

## 2016-11-17 MED ORDER — SILDENAFIL CITRATE 20 MG PO TABS
20.0000 mg | ORAL_TABLET | Freq: Three times a day (TID) | ORAL | 0 refills | Status: DC
Start: 2016-11-17 — End: 2017-01-18

## 2016-11-17 MED ORDER — ACETAZOLAMIDE 250 MG PO TABS: 250.0000 mg | ORAL_TABLET | Freq: Every day | ORAL | Status: DC

## 2016-11-17 MED ORDER — POTASSIUM CHLORIDE ER 10 MEQ PO TBCR: 10.0000 meq | EXTENDED_RELEASE_TABLET | Freq: Every day | ORAL | Status: DC

## 2016-11-17 MED ORDER — LEVOFLOXACIN 750 MG PO TABS: 750.0000 mg | ORAL_TABLET | Freq: Every day | ORAL | Status: DC

## 2016-11-17 MED ORDER — TORSEMIDE 20 MG PO TABS: 40.0000 mg | ORAL_TABLET | Freq: Two times a day (BID) | ORAL | Status: DC

## 2016-11-17 NOTE — Progress Notes (Signed)
Attempted report twice to [facility].  Was sent to voice mail both times.

## 2016-11-17 NOTE — Progress Notes (Signed)
Patient was placed on BiPAP at this time. Settings 12/6 per previous settings. RT explained to patient that these are the same settings she was on before and assured her that they were right by checking the work list and machine set up again. Patient is tolerating well at this time. RT will continue to monitor.

## 2016-11-17 NOTE — Plan of Care (Signed)
Problem: Skin Integrity: Goal: Risk for impaired skin integrity will decrease Outcome: Progressing Gently bathed pt's skin and performed foley care. Pillow/wedge support used to support bony prominences. Foam pad changed on sacrum. Will continue to monitor and assess pt this shift.

## 2016-11-17 NOTE — Progress Notes (Signed)
Patient ID: Stacy FERGESON, female   DOB: 06/27/1951, 66 y.o.   MRN: [id number] P  SUBJECTIVE:   Feeling OK this am. Wore Bipap last night.  Has continued to diurese well with po diuretics.  Creatinine up a bit today to 1.6.  No dyspnea at rest, still short of breath getting around her room.     ABG 7.43/82/88/96% on 4L  RHC 11/07/16 TPG 50-25m Hg PAP mean: 55 mmHg PCWP: 63 mmHg (not accurate).  LVEDP: 28.  PVR using LVEDP and mean PA 5.7 WU.  Cardiac output/index by Fick: 4.74/2.15. RV pressures/EDP: 97/13/21 mmHg Shunt run showed no left to right shunt.   Scheduled Meds: . allopurinol  200 mg Oral Daily  . atenolol  50 mg Oral Daily  . docusate sodium  100 mg Oral BID  . insulin aspart  0-15 Units Subcutaneous TID WC  . insulin aspart  0-5 Units Subcutaneous QHS  . levothyroxine  75 mcg Oral QAC breakfast  . potassium chloride  40 mEq Oral BID  . rivaroxaban  20 mg Oral Q supper  . senna  1 tablet Oral Daily  . sildenafil  20 mg Oral TID  . sodium chloride flush  3 mL Intravenous Q12H  . spironolactone  25 mg Oral Daily   Continuous Infusions: PRN Meds:.sodium chloride, levalbuterol, methocarbamol, ondansetron (ZOFRAN) IV, sodium chloride flush, traMADol    Vitals:   11/16/16 2106 11/16/16 2349 11/17/16 0251 11/17/16 0300  BP: 94/70   103/75  Pulse: 87 79 90 90  Resp:  18  19  Temp: 97.6 F (36.4 C)   97.6 F (36.4 C)  TempSrc: Oral   Axillary  SpO2: 93% 94% 95% 94%  Weight:    229 lb 8 oz (104.1 kg)  Height:        Intake/Output Summary (Last 24 hours) at 11/17/16 0927 Last data filed at 11/17/16 0315  Gross per 24 hour  Intake              600 ml  Output             2150 ml  Net            -1550 ml    LABS: Basic Metabolic Panel:  Recent Labs  11/16/16 0400 11/17/16 0305  NA 135 133*  K 3.4* 3.8  CL 81* 78*  CO2 43* 43*  GLUCOSE 124* 112*  BUN 47* 52*  CREATININE 1.35* 1.62*  CALCIUM 9.4 9.8  MG 1.9 2.0  PHOS 3.9 4.3   Liver Function  Tests: No results for input(s): AST, ALT, ALKPHOS, BILITOT, PROT, ALBUMIN in the last 72 hours. No results for input(s): LIPASE, AMYLASE in the last 72 hours. CBC:  Recent Labs  11/15/16 0325 11/15/16 1954 11/17/16 0305  WBC 9.9 13.4* 11.7*  NEUTROABS 7.7  --   --   HGB 10.6* 11.4* 11.6*  HCT 35.7* 38.3 38.1  MCV 94.7 94.8 93.4  PLT 160 200 241   Cardiac Enzymes: No results for input(s): CKTOTAL, CKMB, CKMBINDEX, TROPONINI in the last 72 hours. BNP: Invalid input(s): POCBNP D-Dimer: No results for input(s): DDIMER in the last 72 hours. Hemoglobin A1C: No results for input(s): HGBA1C in the last 72 hours. Fasting Lipid Panel: No results for input(s): CHOL, HDL, LDLCALC, TRIG, CHOLHDL, LDLDIRECT in the last 72 hours. Thyroid Function Tests: No results for input(s): TSH, T4TOTAL, T3FREE, THYROIDAB in the last 72 hours.  Invalid input(s): FREET3 Anemia Panel: No results for input(s): VITAMINB12,  FOLATE, FERRITIN, TIBC, IRON, RETICCTPCT in the last 72 hours.  RADIOLOGY: Dg Chest Port 1 View  Result Date: 11/15/2016 CLINICAL DATA:  CHF. EXAM: PORTABLE CHEST 1 VIEW COMPARISON:  11/06/2016. FINDINGS: Cardiomegaly with diffuse bilateral pulmonary infiltrates/edema and bilateral pleural effusions. Findings consistent with congestive heart failure. Slight worsening from prior exam. No pneumothorax. Bilateral shoulder replacements. IMPRESSION: Cardiomegaly with bilateral pulmonary infiltrates most consistent pulmonary edema. Slight worsening from prior exam. Small bilateral pleural effusions again noted. No pneumothorax. Electronically Signed   By: Marcello Moores  Register   On: 11/15/2016 06:58   Dg Chest Port 1 View  Result Date: 11/07/2016 CLINICAL DATA:  Hypoxemia EXAM: PORTABLE CHEST 1 VIEW COMPARISON:  11/04/2016 CXR, chest CT 10/06/2016 FINDINGS: Low lung volumes with crowding of interstitial lung markings. Bilateral pleural effusions right greater than left. Mild vascular congestion is  noted though decreased since prior exam. Bilateral shoulder arthroplasties are present. No acute osseous abnormality. IMPRESSION: Cardiomegaly with bilateral pleural effusions right slightly greater than left. Mild vascular congestion. Electronically Signed   By: Ashley Royalty M.D.   On: 11/07/2016 00:10   Dg Chest Port 1 View  Result Date: 11/04/2016 CLINICAL DATA:  66 year old female with chronic respiratory failure, likely pulmonary hypertension. Admitted today for Acute on chronic respiratory failure, possibly acute decompensated heart failure. Initial encounter. EXAM: PORTABLE CHEST 1 VIEW COMPARISON:  Chest radiographs 10/06/2016 and earlier. High-resolution chest CT 10/06/2016. FINDINGS: Portable AP upright view at 1632 hours. Stable cardiomegaly and mediastinal contours. Chronic retrocardiac hypoventilation. Veiling opacity at the right lung base has not significantly changed and there was a moderate size right layering pleural effusion on the 10/06/2016 CT. Associated right perihilar atelectasis. No superimposed pneumothorax. No overt pulmonary edema. IMPRESSION: Suspect stable chest since the chest CT on 10/06/2016; moderate size right pleural effusion and bibasilar atelectasis in the setting of chronic cardiomegaly. Electronically Signed   By: Genevie Ann M.D.   On: 11/04/2016 16:55    PHYSICAL EXAM General: Elderly and fatigued appearing, lying in bed.    Neck: JVP 7-8 cm. No thyromegaly or thyroid nodule.  Lungs: Slight diminished basilar sounds, otherwise clear currently.  CV: Nondisplaced PMI.  Heart irregular. S1/S2, no S3/S4, no murmur. +RV lift  Trace ankle edema with chronic venous stasis changes.  Abdomen: Obese, soft, NT, ND, no HSM. No bruits or masses. +BS  Neurologic: A/O x 3. Moves all 4 extremities without difficulty.   Psych: Affect flat but appropriate.   Extremities: No clubbing or cyanosis.   TELEMETRY: Personally reviewed, Afib 70s      ASSESSMENT AND PLAN: 66 yo with  morbid obesity, OHS/OSA, chronic atrial fibrillation with prior CVA was admitted with acute on chronic diastolic CHF.   1. Acute on chronic diastolic CHF with severe pulmonary hypertension and prominent RV failure: She continues to diurese despite transition to po diuretics. She looks near-euvolemic at this point.  Creatinine up a bit today.   - Hold torsemide today with BUN/creatinine rise, restart at 40 qam/20 qpm tomorrow.  - Hold acetazolamide today, restart at 250 mg daily tomorrow.  - continue spiro 25 mg daily. Supp K.    2. Acute on chronic hypercarbic/hypoxic respiratory failure with OHS/OSA:  followed by Dr. Lake Bells.  Suspected severe OSA but has not yet had sleep study.  On oxygen 5L at home. She is now using Bipap at night.  - ABG with severe CO2 retention with compensated pH. Suggests long-term issue. - She has been using Bipap here, will need to continue  at home.  Will need outpatient sleep study.  3. Atrial fibrillation: Chronic, rate controlled she is on Xarelto + atenolol.  - Continue atenolol 50 mg daily.  - No bleeding on Xarelto.  4. 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  PVR 5.7 WU.  Suspect group 2 (elevated LA pressure) and group 3 (OHS/OSA) PH.  However, cannot rule out group 1 component.  She had V/Q scan that was not suggestive of chronic PE and high resolution chest CT that was not suggestive of ILD.  ANA, RF, anti-SCL70 all negative.  - Volume status appears improved, suspect PCWP down.   - Continue Revatio 20 mg tid.  - She will need oxygen during the day and CPAP/BiPAP at night (needs sleep study).  - Hold torsemide today and restart tomorrow as above.  5. Deconditioning: PT recommending SNF. Continue to mobilize.   Plan for SNF today.    Will schedule HF follow up.   Cardiac meds for home to be Torsemide 40 qam/20 qpm Potassium 40 meq daily Spironolactone 25 mg daily Acetazolamide 250 mg daily Atenolol 50 mg  daily Revatio 20 mg TID Xarelto 20 mg daily  Loralie Champagne, MD  11/17/2016 9:27 AM  Advanced Heart Failure Team Pager [phone] (M-F; 7a - 4p)  Please contact [facility] for night-coverage after hours (4p -7a ) and weekends on [email or web address]

## 2016-11-17 NOTE — Discharge Summary (Signed)
Physician Discharge Summary       Patient ID: [name] MRN: [id number] DOB/AGE: 1951/01/03 66 y.o.  Admit date: 11/04/2016 Discharge date: 11/17/2016  Discharge Diagnoses:  Principal Problem:   Acute on chronic respiratory failure with hypoxemia [facility]) Active Problems:   Morbid obesity (HCC)   Chronic atrial fibrillation (HCC)   OSA (obstructive sleep apnea)   Chronic anticoagulation   Shortness of breath   Hypertensive heart disease   Pulmonary hypertension   Hypoxemia   Pleural effusion, right   Acute on chronic diastolic congestive heart failure (HCC)   Obesity hypoventilation syndrome (HCC)   Coronary artery calcification seen on CAT scan   Aortic atherosclerosis ([place])   Pulmonary hypertension, primary ([place])   History of Present Illness: [name] is a 66 year old female with a past medical history of pulmonary HTN, morbid obesity, chronic atrial fib (on Xarelto), CVA in 2012, OSA (had U PE3 surgery), and chronic respiratory failure with hypoxemia. Her pulmonologist is Dr. Sherrin Daisy and she was seen in his office on 11/04/16. She was admitted from the office that same day with profound dyspnea and acute respiratory failure (O2 saturations in the 60's, and HR dropped to 41 when she ambulated 100 feet). Respiratory failure was felt to be multifactorial secondary to restrictive lung disease and acute on chronic diastolic CHF. Hospital Course:  She was admitted to the Pulmonary/Critical Care service for multifactorial hypoxemic respiratory failure due to obesity restrictive lung disease complicated by CHF. Additional concerns for pulmonary HTN. She was treated with increasing doses of lasix for diuresis and high flow O2 as she refused BiPAP. She underwent cardiac cath (Right and left heart). Coronaries were relatively normal. Based on R heart studies she was felt to have likely WHO group 3 pulmonary HTN secondary to OSA/OHS (results below). She eventually became able to tolerate  BiPAP and responded very well to diuresis (4L out per day for several days). She had significant weight loss (estimated 50lbs, although it is felt that her bed weights were inaccurate). With continued BiPAP and diuresis she significantly improved and was felt to be a candidate for discharge 3/15.   Discharge Plan by active problems Acute on chronic hypoxic/hypercapnic respiratory failure: this is multifactorial in the setting of OSA/OHS, diastolic CHF, and severe PAH.  - Continue supplemental O2 to keep sats 88-95% - Mandatory BiPAP QHS. Continue to stress the importance of this to her. - Will eventually need outpatient sleep study - Pulmonary follow up with Dr. 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  - Revatio 20 mg TID  Atrial fibrillation - chronic, rate controlled. - Continue Xarelto 20 mg daily   Hypothyroidism,  -continue synthroid  Hyperglycemia, no prior DM history - Monitor glucose frequently.  Deconditioning. - mobilize with PT  Urinary tract infection, likely catheter related - Foley D/c - Follow culture - Augmentin 536m BID PO for 5 day course.  Nutrition - Carb modified diet  Goals of care - Full code    Significant Hospital tests/ studies  PFT's Jan 2018 > FVC 0.88 L, 32% predicted, normal ratio, DLCO 7.18 (35% pred). High Res CT chest 10/06/16 > normal pulmonary parenchyma with the exception of interlobular septal thickening and groundglass worse in the dependent sections  of all lobes, there is a moderate size right-sided pleural effusion, pulmonary vascular engorgement. VQ 10/06/16 > No evidence of pulmonary embolism, decreased ventilation and perfusion to the right lower lobe which corresponds to  the large effusion on CT Echo Dec 2017 > LVEF 55-60%, PAP 77 Echo 11/06/16 > EF 55-60%, PAP 90 LHC 3/5 - normal coronaries RHC 3/5 - RHC 03/05 > PCWP 63 (not accurate), LVEDP 28, index 2.15.PAP 55 Autoimmune ANA, RF, anti SCL-70 negative HIV neg   Consults  Cardiology/Advanced Heart Failure Discharge Exam: BP (!) 97/51   Pulse 97   Temp 97.6 F (36.4 C) (Axillary)   Resp 19   Ht _0  (1.549 m)   Wt 104.1 kg (229 lb 8 oz)   LMP 05/06/2013   SpO2 92%   BMI 43.36 kg/m   General:  Elderly obese female in NAD Neuro:  Awake, alert, some mild confusion HEENT:  [place]/AT, PERRL, mild JVP elevation Cardiovascular:  IRIR no MRG. Trace BLE edema Lungs:  Clear bilateral breath sounds Abdomen:  Soft, non-distended, non-tender Musculoskeletal:  No acute deformity Skin:  Intact, MMM  Labs at discharge Lab Results  Component Value Date   CREATININE 1.62 (H) 11/17/2016   BUN 52 (H) 11/17/2016   NA 133 (L) 11/17/2016   K 3.8 11/17/2016   CL 78 (L) 11/17/2016   CO2 43 (H) 11/17/2016   Lab Results  Component Value Date   WBC 11.7 (H) 11/17/2016   HGB 11.6 (L) 11/17/2016   HCT 38.1 11/17/2016   MCV 93.4 11/17/2016   PLT 241 11/17/2016   Lab Results  Component Value Date   ALT 9 (L) 11/04/2016   AST 32 11/04/2016   ALKPHOS 140 (H) 11/04/2016   BILITOT 2.7 (H) 11/04/2016   Lab Results  Component Value Date   INR 2.61 11/07/2016   INR 1.04 03/18/2011   INR 1.73 (H) 03/11/2011    Current radiology studies No results found.  Disposition:  03-Skilled Nursing Facility  Discharge Instructions    Diet - low sodium heart healthy    Complete by:  As directed    Increase activity slowly    Complete by:  As directed      Allergies as of 11/17/2016      Reactions   Ambien [zolpidem Tartrate] Other (See Comments)   Amnesia and fall    Losartan Other (See Comments)   Elevated creatinine and swelling   Prevacid [lansoprazole] Swelling   Adhesive [tape] Other (See Comments)     Adhesive tape and band aids " irritate, " causes blisters and pulls skin when removing. Okay to use paper tape   Keflex [cephalexin] Swelling   Swelling in ankles, feet   Motrin [ibuprofen] Other (See Comments)   ANKLE EDEMA   Prilosec [omeprazole] Other (See Comments)   Swelling in ankles.   Ace Inhibitors Cough   Benadryl [diphenhydramine Hcl] Other (See Comments)   topical      Medication List    STOP taking these medications   furosemide 40 MG tablet Commonly known as:  LASIX   metolazone 2.5 MG tablet Commonly known as:  ZAROXOLYN     TAKE these medications   acetaZOLAMIDE 250 MG tablet Commonly known as:  DIAMOX Take 1 tablet (250 mg total) by mouth daily.   allopurinol 100 MG tablet Commonly known as:  ZYLOPRIM TAKE 2 TABLETS BY MOUTH EVERY DAY   amoxicillin-clavulanate 500-125 MG tablet Commonly known as:  AUGMENTIN Take 1 tablet (500 mg total)  by mouth 2 (two) times daily.   atenolol 50 MG tablet Commonly known as:  TENORMIN Take 1 tablet by mouth in the AM and 0.5 tablet by mouth in the PM What changed:  how much to take  how to take this  when to take this  additional instructions   colchicine 0.6 MG tablet Take 0.6 mg by mouth daily as needed (flareups).   Fish Oil 1000 MG Caps Take 1,000 mg by mouth daily.   levothyroxine 75 MCG tablet Commonly known as:  SYNTHROID, LEVOTHROID TAKE 1 TABLET BY MOUTH EVERY DAY   methocarbamol 500 MG tablet Commonly known as:  ROBAXIN Take 500 mg by mouth every 6 (six) hours as needed for muscle spasms.   OXYGEN Inhale 4-5 L into the lungs continuous.   potassium chloride 10 MEQ tablet Commonly known as:  K-DUR Take 1 tablet (10 mEq total) by mouth daily. What changed:  See the new instructions.   rivaroxaban 20 MG Tabs tablet Commonly known as:  XARELTO Take 1 tablet (20 mg total) by mouth daily with supper.   sildenafil 20 MG tablet Commonly known as:  REVATIO Take 1 tablet (20 mg total) by  mouth 3 (three) times daily.   spironolactone 25 MG tablet Commonly known as:  ALDACTONE Take 1 tablet (25 mg total) by mouth daily.   torsemide 20 MG tablet Commonly known as:  DEMADEX Take 2 tablets (40 mg total) by mouth 2 (two) times daily.   traMADol 50 MG tablet Commonly known as:  ULTRAM TAKE 1 TABLET 3 TIMES A DAY AS NEEDED FOR PAIN   VOLTAREN 1 % Gel Generic drug:  diclofenac sodium Apply 2 g topically 2 (two) times daily as needed (for arthritis).       Contact information for follow-up providers    Simonne Maffucci, MD Follow up on 12/13/2016.   Specialty:  Pulmonary Disease Why:  9:30AM [facility]. 2nd Floor Contact information: [place] [address] [phone]            Contact information for after-discharge care    Destination    [facility] Follow up.   Specialty:  [place] information: [address] [phone]                  Discharged Condition: good  Greater than 35 minutes of time have been dedicated to discharge assessment, planning and discharge instructions.   Signed: Georgann Housekeeper, AGACNP-BC [facility] Pager [phone] or [phone]  11/17/2016 11:09 AM   Attending Note:  I have examined patient, reviewed labs, studies and notes. I have discussed the case with Jaclynn Guarneri, and I agree with the data and plans as amended above. Patient ready for discharge. She has multifactorial severe pulmonary hypertension with associated right heart failure. She has been effectively diuresed and has lost over 50 pounds. Most important modification that will need to be made once she is discharged is better therapy for her OSA / OHS (she underwent UPPP in the past). She will need a polysomnogram with BiPAP titration study. Follow up in our office with Dr Lake Bells.   Baltazar Apo, MD, PhD 11/17/2016, 2:27 PM [facility] [phone] or if no answer [phone]

## 2016-11-17 NOTE — Clinical Social Work Note (Signed)
[facility] has received insurance authorization at this time. Pt has a bed today. If pt is cleared for d/c MD please provide summary and scripts.  [address], [place]

## 2016-11-17 NOTE — Clinical Social Work Note (Signed)
Clinical Social Worker facilitated patient discharge including contacting patient family and facility to confirm patient discharge plans.  Clinical information faxed to facility and family agreeable with plan.  CSW arranged ambulance transport via [facility] (3:00) to [facility].  RN to call [phone] for report prior to discharge.  Clinical Social Worker will sign off for now as social work intervention is no longer needed. Please consult [place] again if new need arises.  [address], [place]

## 2016-11-18 DIAGNOSIS — I272 Pulmonary hypertension, unspecified: Secondary | ICD-10-CM | POA: Diagnosis not present

## 2016-11-18 DIAGNOSIS — E662 Morbid (severe) obesity with alveolar hypoventilation: Secondary | ICD-10-CM | POA: Diagnosis not present

## 2016-11-18 DIAGNOSIS — R5381 Other malaise: Secondary | ICD-10-CM | POA: Diagnosis not present

## 2016-11-18 DIAGNOSIS — I5030 Unspecified diastolic (congestive) heart failure: Secondary | ICD-10-CM | POA: Diagnosis not present

## 2016-11-18 DIAGNOSIS — I4891 Unspecified atrial fibrillation: Secondary | ICD-10-CM | POA: Diagnosis not present

## 2016-11-18 DIAGNOSIS — N390 Urinary tract infection, site not specified: Secondary | ICD-10-CM | POA: Diagnosis not present

## 2016-11-18 DIAGNOSIS — E039 Hypothyroidism, unspecified: Secondary | ICD-10-CM | POA: Diagnosis not present

## 2016-11-18 LAB — URINE CULTURE

## 2016-11-28 ENCOUNTER — Inpatient Hospital Stay (HOSPITAL_COMMUNITY): Admit: 2016-11-28 | Payer: PPO

## 2016-11-30 DIAGNOSIS — R269 Unspecified abnormalities of gait and mobility: Secondary | ICD-10-CM | POA: Diagnosis not present

## 2016-11-30 DIAGNOSIS — J9610 Chronic respiratory failure, unspecified whether with hypoxia or hypercapnia: Secondary | ICD-10-CM | POA: Diagnosis not present

## 2016-11-30 DIAGNOSIS — I5040 Unspecified combined systolic (congestive) and diastolic (congestive) heart failure: Secondary | ICD-10-CM | POA: Diagnosis not present

## 2016-12-04 DIAGNOSIS — M109 Gout, unspecified: Secondary | ICD-10-CM | POA: Diagnosis not present

## 2016-12-04 DIAGNOSIS — I4891 Unspecified atrial fibrillation: Secondary | ICD-10-CM | POA: Diagnosis not present

## 2016-12-04 DIAGNOSIS — G5730 Lesion of lateral popliteal nerve, unspecified lower limb: Secondary | ICD-10-CM | POA: Diagnosis not present

## 2016-12-04 DIAGNOSIS — E662 Morbid (severe) obesity with alveolar hypoventilation: Secondary | ICD-10-CM | POA: Diagnosis not present

## 2016-12-04 DIAGNOSIS — M1990 Unspecified osteoarthritis, unspecified site: Secondary | ICD-10-CM | POA: Diagnosis not present

## 2016-12-04 DIAGNOSIS — D519 Vitamin B12 deficiency anemia, unspecified: Secondary | ICD-10-CM | POA: Diagnosis not present

## 2016-12-04 DIAGNOSIS — M6281 Muscle weakness (generalized): Secondary | ICD-10-CM | POA: Diagnosis not present

## 2016-12-04 DIAGNOSIS — R262 Difficulty in walking, not elsewhere classified: Secondary | ICD-10-CM | POA: Diagnosis not present

## 2016-12-04 DIAGNOSIS — I2510 Atherosclerotic heart disease of native coronary artery without angina pectoris: Secondary | ICD-10-CM | POA: Diagnosis not present

## 2016-12-04 DIAGNOSIS — E785 Hyperlipidemia, unspecified: Secondary | ICD-10-CM | POA: Diagnosis not present

## 2016-12-04 DIAGNOSIS — E039 Hypothyroidism, unspecified: Secondary | ICD-10-CM | POA: Diagnosis not present

## 2016-12-04 DIAGNOSIS — J9621 Acute and chronic respiratory failure with hypoxia: Secondary | ICD-10-CM | POA: Diagnosis not present

## 2016-12-04 DIAGNOSIS — Z7901 Long term (current) use of anticoagulants: Secondary | ICD-10-CM | POA: Diagnosis not present

## 2016-12-04 DIAGNOSIS — I272 Pulmonary hypertension, unspecified: Secondary | ICD-10-CM | POA: Diagnosis not present

## 2016-12-04 DIAGNOSIS — G4730 Sleep apnea, unspecified: Secondary | ICD-10-CM | POA: Diagnosis not present

## 2016-12-04 DIAGNOSIS — I5033 Acute on chronic diastolic (congestive) heart failure: Secondary | ICD-10-CM | POA: Diagnosis not present

## 2016-12-04 DIAGNOSIS — Z8673 Personal history of transient ischemic attack (TIA), and cerebral infarction without residual deficits: Secondary | ICD-10-CM | POA: Diagnosis not present

## 2016-12-04 DIAGNOSIS — R278 Other lack of coordination: Secondary | ICD-10-CM | POA: Diagnosis not present

## 2016-12-13 ENCOUNTER — Encounter: Payer: Self-pay | Admitting: Physician Assistant

## 2016-12-13 ENCOUNTER — Ambulatory Visit (INDEPENDENT_AMBULATORY_CARE_PROVIDER_SITE_OTHER): Payer: PPO | Admitting: Physician Assistant

## 2016-12-13 ENCOUNTER — Ambulatory Visit (INDEPENDENT_AMBULATORY_CARE_PROVIDER_SITE_OTHER): Payer: PPO | Admitting: Pulmonary Disease

## 2016-12-13 ENCOUNTER — Encounter: Payer: Self-pay | Admitting: Pulmonary Disease

## 2016-12-13 ENCOUNTER — Other Ambulatory Visit (INDEPENDENT_AMBULATORY_CARE_PROVIDER_SITE_OTHER): Payer: PPO

## 2016-12-13 ENCOUNTER — Telehealth: Payer: Self-pay | Admitting: *Deleted

## 2016-12-13 VITALS — BP 110/60 | HR 97 | Ht 62.0 in | Wt 226.0 lb

## 2016-12-13 VITALS — BP 124/58 | HR 65 | Ht 61.0 in | Wt 226.0 lb

## 2016-12-13 DIAGNOSIS — I5032 Chronic diastolic (congestive) heart failure: Secondary | ICD-10-CM

## 2016-12-13 DIAGNOSIS — I482 Chronic atrial fibrillation, unspecified: Secondary | ICD-10-CM

## 2016-12-13 DIAGNOSIS — I5030 Unspecified diastolic (congestive) heart failure: Secondary | ICD-10-CM

## 2016-12-13 DIAGNOSIS — I5033 Acute on chronic diastolic (congestive) heart failure: Secondary | ICD-10-CM | POA: Diagnosis not present

## 2016-12-13 DIAGNOSIS — G4733 Obstructive sleep apnea (adult) (pediatric): Secondary | ICD-10-CM

## 2016-12-13 DIAGNOSIS — Z5181 Encounter for therapeutic drug level monitoring: Secondary | ICD-10-CM | POA: Diagnosis not present

## 2016-12-13 LAB — CBC
HCT: 33.5 % — ABNORMAL LOW (ref 36.0–46.0)
Hemoglobin: 11.2 g/dL — ABNORMAL LOW (ref 12.0–15.0)
MCHC: 33.4 g/dL (ref 30.0–36.0)
MCV: 92.7 fl (ref 78.0–100.0)
PLATELETS: 187 10*3/uL (ref 150.0–400.0)
RBC: 3.62 Mil/uL — AB (ref 3.87–5.11)
RDW: 18.8 % — ABNORMAL HIGH (ref 11.5–15.5)
WBC: 6.5 10*3/uL (ref 4.0–10.5)

## 2016-12-13 LAB — BASIC METABOLIC PANEL
BUN: 49 mg/dL — ABNORMAL HIGH (ref 6–23)
CHLORIDE: 97 meq/L (ref 96–112)
CO2: 29 meq/L (ref 19–32)
Calcium: 9.9 mg/dL (ref 8.4–10.5)
Creatinine, Ser: 1.34 mg/dL — ABNORMAL HIGH (ref 0.40–1.20)
GFR: 41.89 mL/min — ABNORMAL LOW (ref >60.00)
GLUCOSE: 105 mg/dL — AB (ref 70–99)
POTASSIUM: 3.8 meq/L (ref 3.5–5.1)
SODIUM: 135 meq/L (ref 135–145)

## 2016-12-13 LAB — MAGNESIUM: Magnesium: 2.2 mg/dL (ref 1.5–2.5)
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
## 2016-12-13 NOTE — Telephone Encounter (Signed)
Staff message sent to Kevan Rosebush, RN at CHF clinic for review

## 2016-12-13 NOTE — Patient Instructions (Signed)
Medication Instructions:  Your physician recommends that you continue on your current medications as directed. Please refer to the Current Medication list given to you today.  If you need a refill on your cardiac medications before your next appointment, please call your pharmacy.  Follow-Up: Your physician wants you to follow-up in: 6 [place] will receive a reminder letter in the mail two months in advance. If you don't receive a letter, please call our office AUGUST 2018 to schedule the October 2018 follow-up appointment.  Your physician wants you to follow-up [facility] OFFICE IN 2 WEEKS OR 1ST AVAILABLE   Thank you for choosing [facility] at [facility]!!    [name] BARRETT, Revonda Humphrey, [place]

## 2016-12-13 NOTE — Patient Instructions (Signed)
For your chronic respiratory failure with hypoxemia: Use 2 L of oxygen continuously  For your chronic respiratory failure with hypercapnia (obesity hypoventilation syndrome): Continue using BiPAP every night We will arrange for a BiPAP titration study  For your diastolic heart failure with associated pulmonary hypertension: Measure your weight every day Your dry weight is 225 pounds Keep taking your diuretics as you're doing We will collect a basic metabolic panel today to measure your electrolytes Based on the results of today's lab test we will set up a a strategy for you with the cardiology team for how to adjust your diuretics if your weight is 228 pounds or above We will arrange for a consultation with the advanced heart failure clinic  For your deconditioning: Continue participating in physical therapy We'll make arrangements for cardiac rehabilitation  Follow up with [place] with a nurse practitioner in 6 weeks

## 2016-12-14 ENCOUNTER — Telehealth (HOSPITAL_COMMUNITY): Payer: Self-pay | Admitting: Cardiology

## 2016-12-14 NOTE — Telephone Encounter (Signed)
Per staff message from Kevan Rosebush, RN, I called patient to schedule appt w/APP.  LM for patient to return my call to schedule this appt

## 2016-12-15 NOTE — Telephone Encounter (Signed)
I called and spoke with patient.  She stated she is currently in Rehab _0  and was unaware of her hosp f/u appt 11/28/16 here.  Pt states she will need to check her calendar & with her family to see when she can come for an appt.  She has our number and stated she will call back to schedule an appt.

## 2016-12-16 IMAGING — CR DG CHEST 2V
2 series · 2 of 2 positions shown · non-contrast
Comparison: May 25, 2015

CLINICAL DATA: Shortness of breath with exertion for 5 days

EXAM:
CHEST  2 VIEW

[w chest pa]
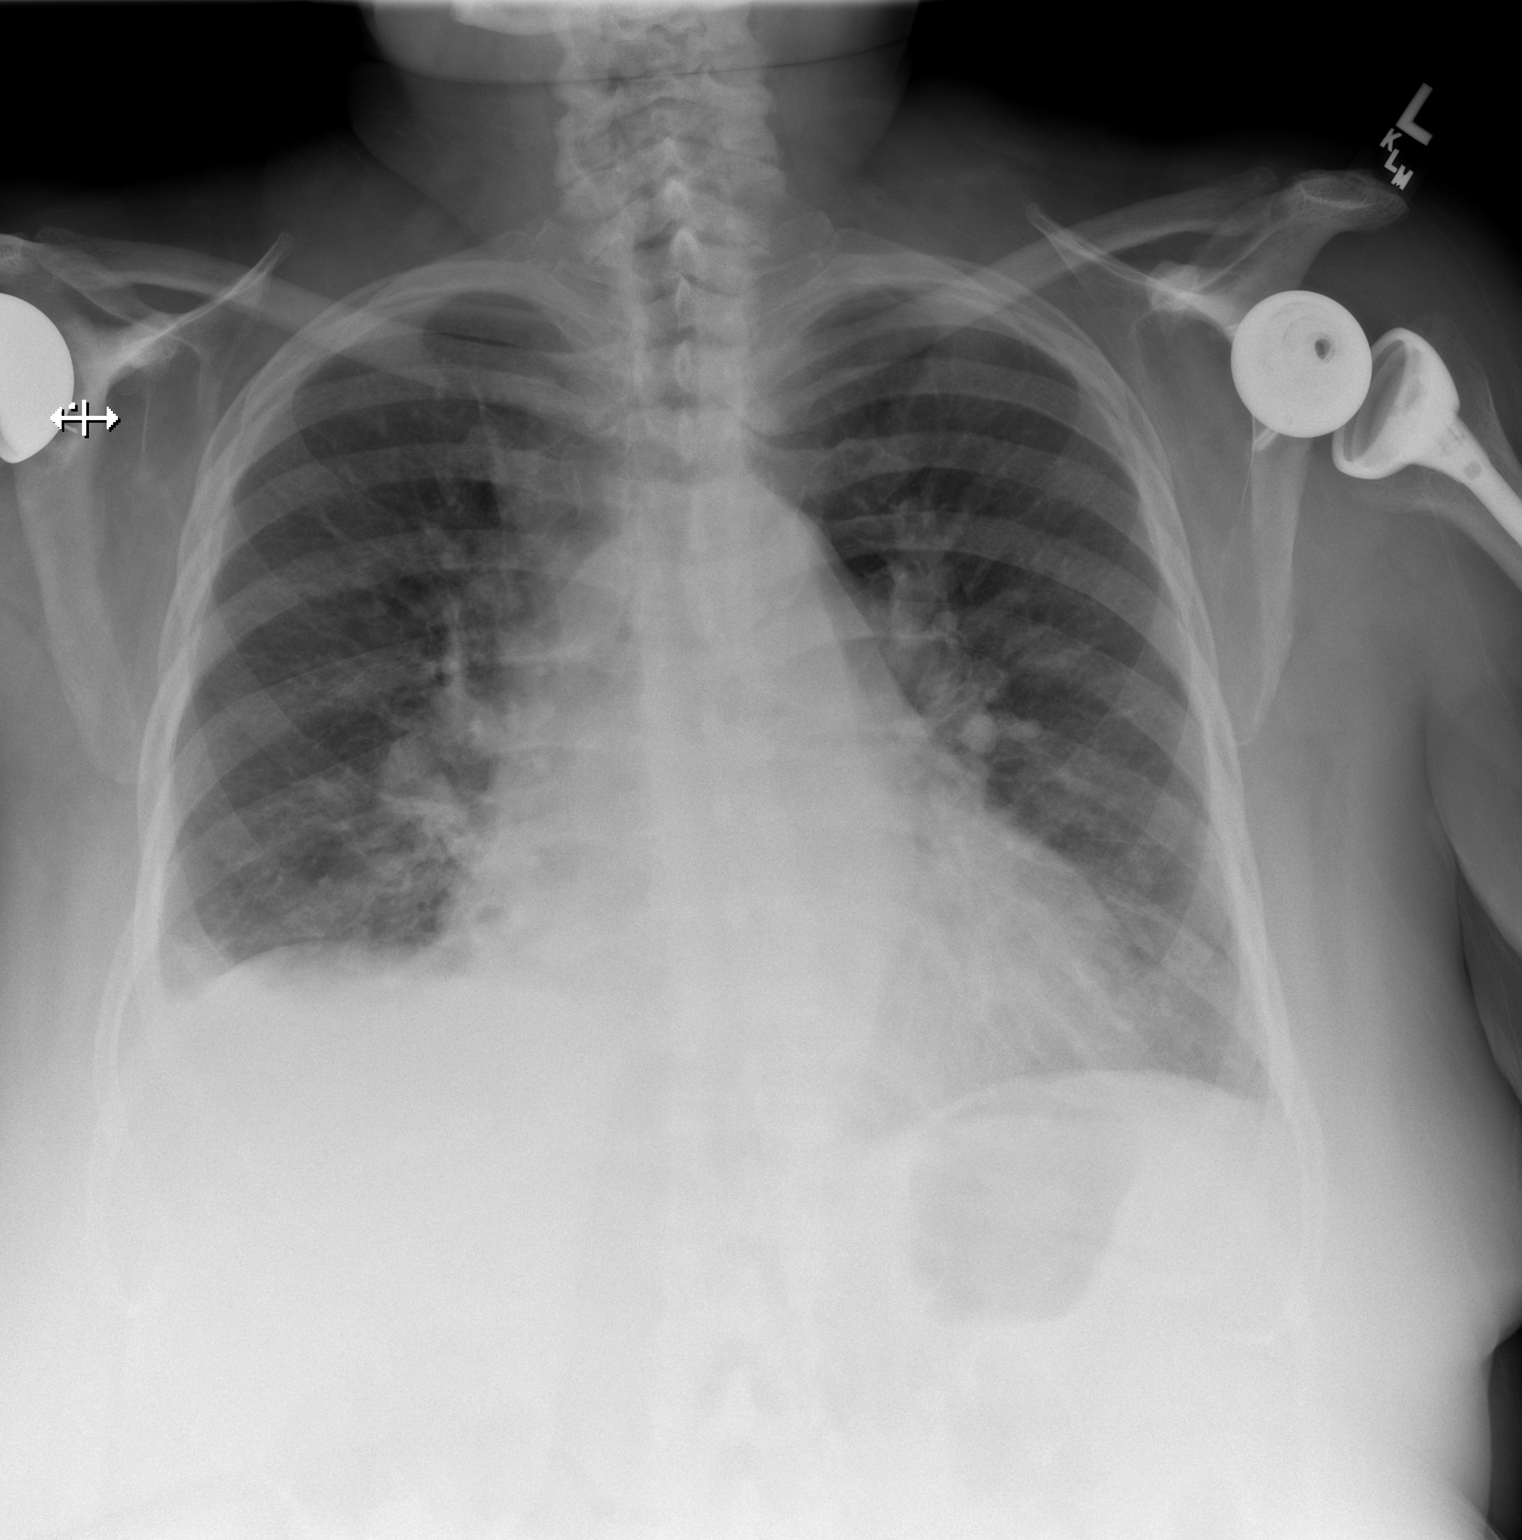

[w chest lat]
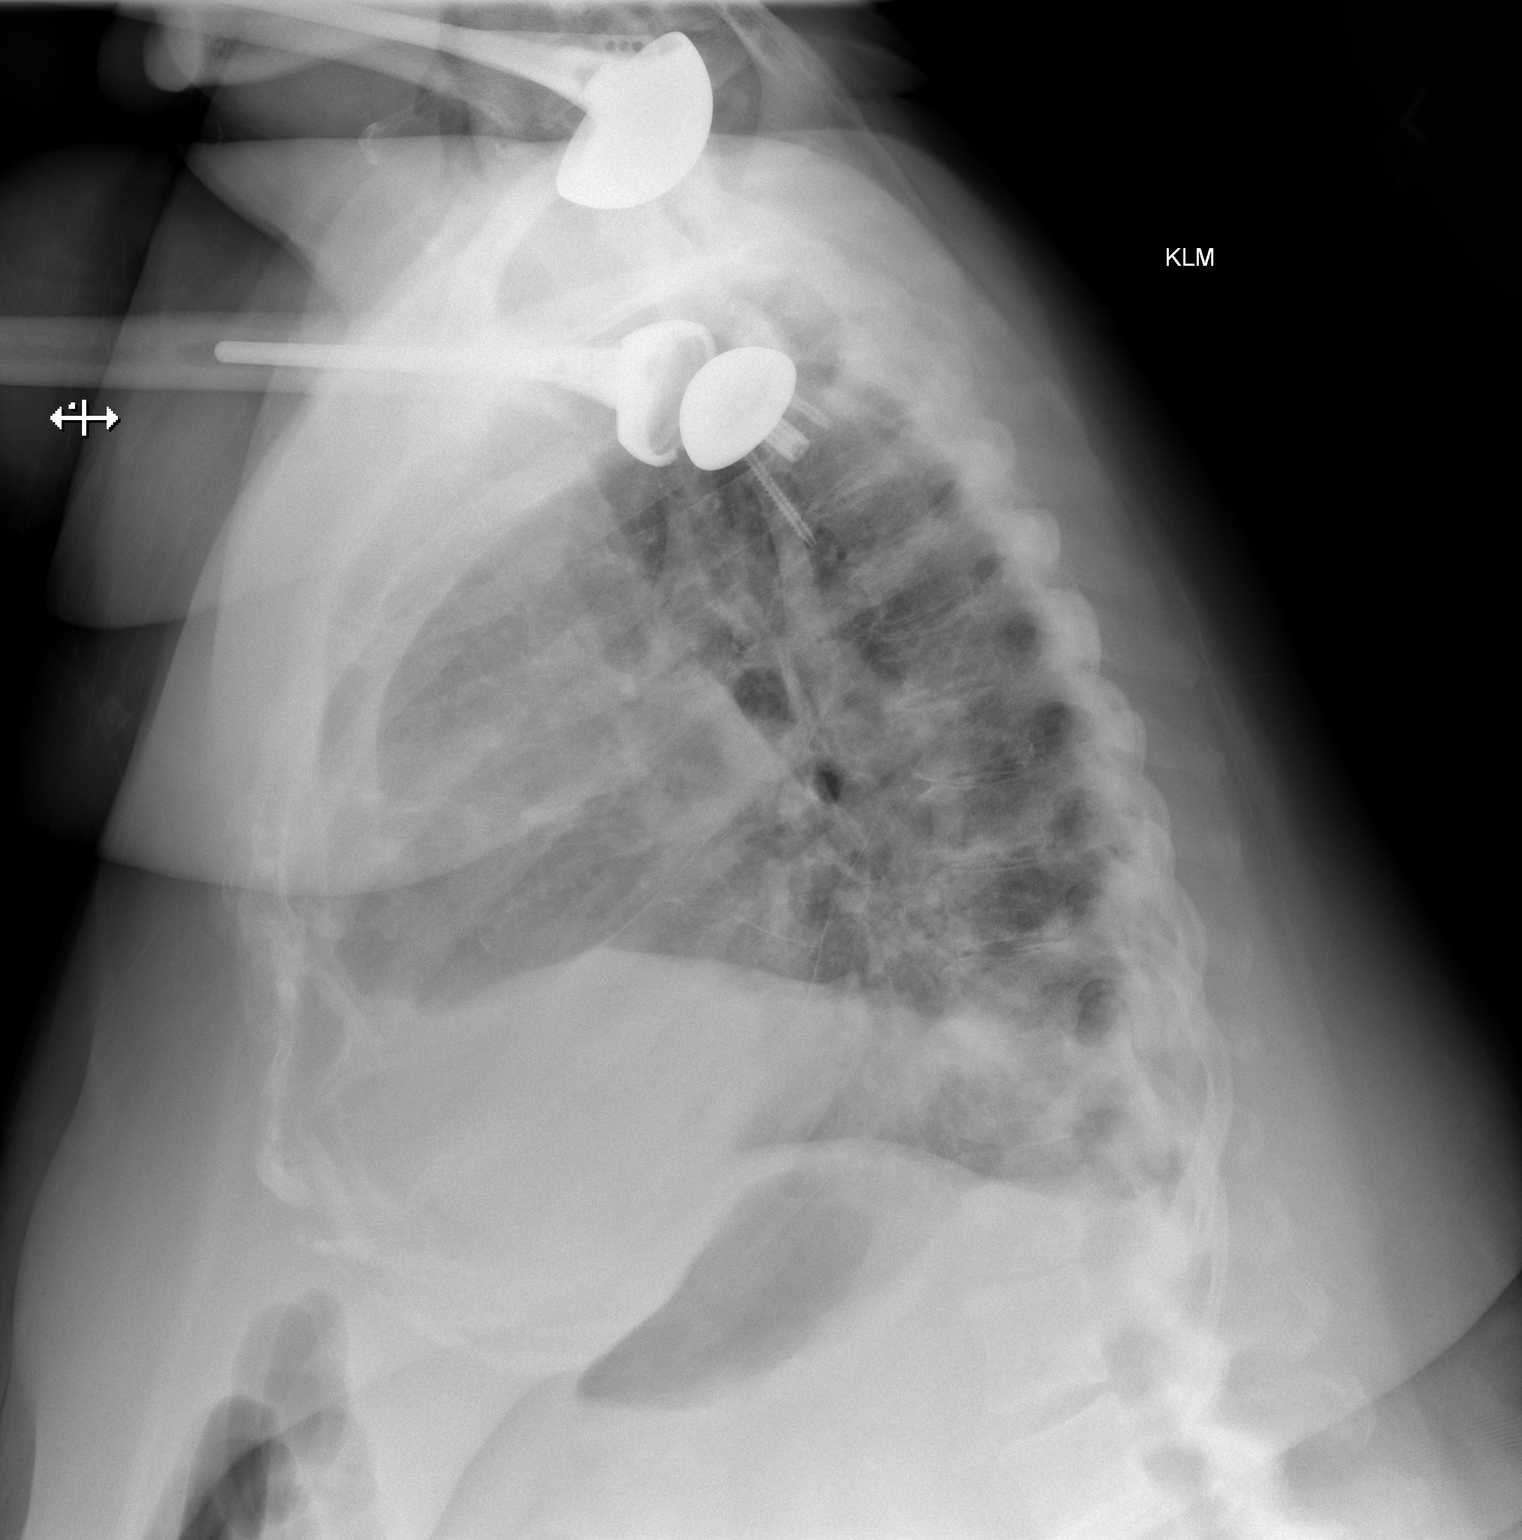

[2 of 2 positions shown; findings below may reference images not displayed]

FINDINGS: There is no appreciable edema or consolidation. There is
cardiomegaly with pulmonary venous hypertension. No adenopathy.
There are total shoulder replacements bilaterally. There is
degenerative change in the thoracic spine.
IMPRESSION: Persistent pulmonary vascular congestion. No frank edema or
consolidation.

## 2016-12-18 DIAGNOSIS — I4891 Unspecified atrial fibrillation: Secondary | ICD-10-CM | POA: Diagnosis not present

## 2016-12-18 DIAGNOSIS — I129 Hypertensive chronic kidney disease with stage 1 through stage 4 chronic kidney disease, or unspecified chronic kidney disease: Secondary | ICD-10-CM | POA: Diagnosis not present

## 2016-12-18 DIAGNOSIS — I270 Primary pulmonary hypertension: Secondary | ICD-10-CM | POA: Diagnosis not present

## 2016-12-18 DIAGNOSIS — N183 Chronic kidney disease, stage 3 (moderate): Secondary | ICD-10-CM | POA: Diagnosis not present

## 2016-12-18 DIAGNOSIS — I509 Heart failure, unspecified: Secondary | ICD-10-CM | POA: Diagnosis not present

## 2016-12-18 DIAGNOSIS — J9690 Respiratory failure, unspecified, unspecified whether with hypoxia or hypercapnia: Secondary | ICD-10-CM | POA: Diagnosis not present

## 2016-12-19 NOTE — Telephone Encounter (Signed)
Patient called back and spoke with [facility].  She was scheduled with APP on 01/02/17 on 9 am.

## 2016-12-22 ENCOUNTER — Ambulatory Visit: Payer: PPO | Admitting: Internal Medicine

## 2016-12-26 ENCOUNTER — Other Ambulatory Visit: Payer: Self-pay | Admitting: Internal Medicine

## 2016-12-26 ENCOUNTER — Telehealth (HOSPITAL_COMMUNITY): Payer: Self-pay

## 2016-12-26 ENCOUNTER — Telehealth: Payer: Self-pay | Admitting: Emergency Medicine

## 2016-12-26 ENCOUNTER — Telehealth: Payer: Self-pay | Admitting: Pulmonary Disease

## 2016-12-26 DIAGNOSIS — G4733 Obstructive sleep apnea (adult) (pediatric): Secondary | ICD-10-CM
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
## 2016-12-26 NOTE — Telephone Encounter (Signed)
Please inform pt that insurance requires her to have repeat diagnostic study first.  Please send order for split night study.

## 2016-12-26 NOTE — Telephone Encounter (Signed)
Pt has appointment script called in

## 2016-12-26 NOTE — Telephone Encounter (Signed)
Ok to refill x 1   Ov before further refills

## 2016-12-26 NOTE — Telephone Encounter (Signed)
VS  Please Advise in BQ's absence-  Terry from [facility] called and stated pt's will need to have a split night before she can have the bipap titration due to pt original sleep study was done back in 2012 . Please advise if ok to place new order. Spoke with pt and made her aware of this, she just wanted an update of what the decision is.

## 2016-12-26 NOTE — Telephone Encounter (Signed)
lmom tcb x1 to pt

## 2016-12-27 ENCOUNTER — Telehealth: Payer: Self-pay | Admitting: Emergency Medicine

## 2016-12-27 ENCOUNTER — Ambulatory Visit (INDEPENDENT_AMBULATORY_CARE_PROVIDER_SITE_OTHER): Payer: PPO | Admitting: Internal Medicine

## 2016-12-27 ENCOUNTER — Encounter: Payer: Self-pay | Admitting: Internal Medicine

## 2016-12-27 VITALS — BP 90/70 | HR 72 | Temp 98.3°F | Ht 62.0 in | Wt 222.0 lb

## 2016-12-27 DIAGNOSIS — Z7901 Long term (current) use of anticoagulants: Secondary | ICD-10-CM

## 2016-12-27 DIAGNOSIS — Z79899 Other long term (current) drug therapy: Secondary | ICD-10-CM | POA: Diagnosis not present

## 2016-12-27 DIAGNOSIS — I5032 Chronic diastolic (congestive) heart failure: Secondary | ICD-10-CM

## 2016-12-27 DIAGNOSIS — I482 Chronic atrial fibrillation, unspecified: Secondary | ICD-10-CM

## 2016-12-27 DIAGNOSIS — R21 Rash and other nonspecific skin eruption: Secondary | ICD-10-CM | POA: Diagnosis not present

## 2016-12-27 DIAGNOSIS — J9610 Chronic respiratory failure, unspecified whether with hypoxia or hypercapnia: Secondary | ICD-10-CM

## 2016-12-27 DIAGNOSIS — I272 Pulmonary hypertension, unspecified: Secondary | ICD-10-CM

## 2016-12-27 DIAGNOSIS — R05 Cough: Secondary | ICD-10-CM

## 2016-12-27 DIAGNOSIS — R0902 Hypoxemia: Secondary | ICD-10-CM | POA: Diagnosis not present

## 2016-12-27 DIAGNOSIS — R059 Cough, unspecified: Secondary | ICD-10-CM

## 2016-12-27 MED ORDER — PREDNISONE 10 MG PO TABS
ORAL_TABLET | ORAL | 0 refills | Status: DC
Start: 2016-12-27 — End: 2017-01-17

## 2016-12-27 MED ORDER — TRIAMCINOLONE ACETONIDE 0.1 % EX CREA: 1.0000 "application " | TOPICAL_CREAM | Freq: Two times a day (BID) | CUTANEOUS | 2 refills | Status: DC

## 2016-12-27 NOTE — Telephone Encounter (Signed)
Spoke with pt, aware of change.  Split night study ordered.  Nothing further needed.

## 2016-12-27 NOTE — Telephone Encounter (Signed)
lmtcb for pt.

## 2016-12-27 NOTE — Patient Instructions (Addendum)
The rash looks like a allergic rash possibly from a medication most likely. We'll give you a course of prednisone as discussed. Can use the topical TMs see if it helps. Can try a different antihistamine such as Zyrtec   Plain but can cause some drowsiness also .   Stop the allopurinol for now  Although you have been in on it a while he is an can be a cause of her rash like this. It is possible when your new medicines could cause this but because there crucial to your care at this time we'll not stop them without having cardiology involved.   Fu or keep [place] informed about hwo the rash is doing  Her chest is clear on exam today. Agree with OT and PT at home. The prednisone may increase her blood sugar but I don't think that will be dangerous in your situation. Advise follow-up visit in 2 months to make sure all your healthcare needs are met. Or as needed.

## 2016-12-27 NOTE — Telephone Encounter (Signed)
Spoke with Melissa from [facility] per Dr. Regis Bill okay to start PT on the 26th of April. Nothing further needed

## 2016-12-27 NOTE — Telephone Encounter (Signed)
Patient returned phone call.[name] ° °

## 2016-12-28 ENCOUNTER — Ambulatory Visit (HOSPITAL_BASED_OUTPATIENT_CLINIC_OR_DEPARTMENT_OTHER): Payer: PPO | Attending: Pulmonary Disease | Admitting: Pulmonary Disease

## 2016-12-28 DIAGNOSIS — G4733 Obstructive sleep apnea (adult) (pediatric): Secondary | ICD-10-CM | POA: Diagnosis not present

## 2016-12-28 DIAGNOSIS — E662 Morbid (severe) obesity with alveolar hypoventilation: Secondary | ICD-10-CM

## 2016-12-28 DIAGNOSIS — I272 Pulmonary hypertension, unspecified: Secondary | ICD-10-CM

## 2016-12-31 DIAGNOSIS — I5040 Unspecified combined systolic (congestive) and diastolic (congestive) heart failure: Secondary | ICD-10-CM | POA: Diagnosis not present

## 2016-12-31 DIAGNOSIS — R269 Unspecified abnormalities of gait and mobility: Secondary | ICD-10-CM | POA: Diagnosis not present

## 2016-12-31 DIAGNOSIS — J9610 Chronic respiratory failure, unspecified whether with hypoxia or hypercapnia: Secondary | ICD-10-CM | POA: Diagnosis not present

## 2017-01-02 ENCOUNTER — Ambulatory Visit (HOSPITAL_COMMUNITY)
Admission: RE | Admit: 2017-01-02 | Discharge: 2017-01-02 | Disposition: A | Discharge status: Home / Self Care | Payer: PPO | Source: Ambulatory Visit | Attending: Cardiology | Admitting: Cardiology

## 2017-01-02 ENCOUNTER — Encounter (HOSPITAL_COMMUNITY): Payer: Self-pay

## 2017-01-02 VITALS — BP 152/70 | HR 69 | Wt 223.0 lb

## 2017-01-02 DIAGNOSIS — Z8673 Personal history of transient ischemic attack (TIA), and cerebral infarction without residual deficits: Secondary | ICD-10-CM | POA: Diagnosis not present

## 2017-01-02 DIAGNOSIS — J9611 Chronic respiratory failure with hypoxia: Secondary | ICD-10-CM | POA: Insufficient documentation

## 2017-01-02 DIAGNOSIS — I482 Chronic atrial fibrillation, unspecified: Secondary | ICD-10-CM

## 2017-01-02 DIAGNOSIS — G4733 Obstructive sleep apnea (adult) (pediatric): Secondary | ICD-10-CM | POA: Diagnosis not present

## 2017-01-02 DIAGNOSIS — Z888 Allergy status to other drugs, medicaments and biological substances status: Secondary | ICD-10-CM | POA: Insufficient documentation

## 2017-01-02 DIAGNOSIS — E538 Deficiency of other specified B group vitamins: Secondary | ICD-10-CM | POA: Diagnosis not present

## 2017-01-02 DIAGNOSIS — Z7901 Long term (current) use of anticoagulants: Secondary | ICD-10-CM | POA: Diagnosis not present

## 2017-01-02 DIAGNOSIS — I5032 Chronic diastolic (congestive) heart failure: Secondary | ICD-10-CM | POA: Diagnosis not present

## 2017-01-02 DIAGNOSIS — L270 Generalized skin eruption due to drugs and medicaments taken internally: Secondary | ICD-10-CM | POA: Diagnosis not present

## 2017-01-02 DIAGNOSIS — M1990 Unspecified osteoarthritis, unspecified site: Secondary | ICD-10-CM | POA: Insufficient documentation

## 2017-01-02 DIAGNOSIS — Z886 Allergy status to analgesic agent status: Secondary | ICD-10-CM | POA: Insufficient documentation

## 2017-01-02 DIAGNOSIS — I272 Pulmonary hypertension, unspecified: Secondary | ICD-10-CM

## 2017-01-02 DIAGNOSIS — E785 Hyperlipidemia, unspecified: Secondary | ICD-10-CM | POA: Insufficient documentation

## 2017-01-02 DIAGNOSIS — E039 Hypothyroidism, unspecified: Secondary | ICD-10-CM | POA: Insufficient documentation

## 2017-01-02 DIAGNOSIS — I110 Hypertensive heart disease with heart failure: Secondary | ICD-10-CM | POA: Insufficient documentation

## 2017-01-02 DIAGNOSIS — I5042 Chronic combined systolic (congestive) and diastolic (congestive) heart failure: Secondary | ICD-10-CM | POA: Diagnosis not present

## 2017-01-02 DIAGNOSIS — Z6841 Body Mass Index (BMI) 40.0 and over, adult: Secondary | ICD-10-CM | POA: Insufficient documentation

## 2017-01-02 DIAGNOSIS — Z881 Allergy status to other antibiotic agents status: Secondary | ICD-10-CM | POA: Diagnosis not present

## 2017-01-02 DIAGNOSIS — E662 Morbid (severe) obesity with alveolar hypoventilation: Secondary | ICD-10-CM | POA: Diagnosis not present

## 2017-01-02 DIAGNOSIS — I2721 Secondary pulmonary arterial hypertension: Secondary | ICD-10-CM | POA: Insufficient documentation

## 2017-01-02 DIAGNOSIS — Z79899 Other long term (current) drug therapy: Secondary | ICD-10-CM | POA: Insufficient documentation

## 2017-01-02 LAB — BASIC METABOLIC PANEL
ANION GAP: 6 (ref 5–15)
BUN: 32 mg/dL — AB (ref 6–20)
CO2: 24 mmol/L (ref 22–32)
CREATININE: 0.89 mg/dL (ref 0.44–1.00)
Calcium: 9.2 mg/dL (ref 8.9–10.3)
Chloride: 107 mmol/L (ref 101–111)
GFR calc Af Amer: >60 mL/min (ref >60)
GLUCOSE: 111 mg/dL — AB (ref 65–99)
Potassium: 3.9 mmol/L (ref 3.5–5.1)
Sodium: 137 mmol/L (ref 135–145)
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
## 2017-01-02 NOTE — Procedures (Signed)
Patient Name: [name], [name] Date: 12/28/2016   Gender: Female  D.O.B: [birth year]  Age (years): 65  Referring Provider: Simonne Maffucci   Height (inches): 22  Interpreting Physician: Chesley Mires MD, ABSM  Weight (lbs): 225  RPSGT: Zadie Rhine   BMI: 41  MRN: [id number]  Neck Size: 16.00    CLINICAL INFORMATION  Sleep Study Type: NPSG Indication for sleep study: 66 yr old female with history of OSA, OHS, and pulmonary hypertension. She recently lost significant amount of weight. She is referred to sleep lab to assess current status of sleep disordered breathing. Epworth Sleepiness Score: 4 SLEEP STUDY TECHNIQUE  As per the AASM Manual for the Scoring of Sleep and Associated Events v2.3 (April 2016) with a hypopnea requiring 4% desaturations.  The channels recorded and monitored were frontal, central and occipital EEG, electrooculogram (EOG), submentalis EMG (chin), nasal and oral airflow, thoracic and abdominal wall motion, anterior tibialis EMG, snore microphone, electrocardiogram, and pulse oximetry.  MEDICATIONS  Medications self-administered by patient taken the night of the study : TRAMADOL  SLEEP ARCHITECTURE  The study was initiated at 11:11:51 PM and ended at 5:18:40 AM.  Sleep onset time was 43.8 minutes and the sleep efficiency was 39.1%. The total sleep time was 143.5 minutes.  Stage REM latency was N/A minutes.  The patient spent 15.33% of the night in stage N1 sleep, 84.67% in stage N2 sleep, 0.00% in stage N3 and 0.00% in REM.  Alpha intrusion was absent.  Supine sleep was 37.28%.  RESPIRATORY PARAMETERS  The overall apnea/hypopnea index (AHI) was 0.0 per hour. There were 0 total apneas, including 0 obstructive, 0 central and 0 mixed apneas. There were 0 hypopneas and 1 RERAs. The AHI during Stage REM sleep was N/A per hour. AHI while supine was 0.0 per hour. The mean oxygen saturation was 93.87%. The minimum SpO2 during sleep was 90.00%. The study was  conducted with her using her baseline of 2 liters supplemental oxygen. Soft snoring was noted during this study. CARDIAC DATA  The 2 lead EKG demonstrated atrial fibrillation. The mean heart rate was 78.88 beats per minute. Other EKG findings include: PVCs.  LEG MOVEMENT DATA  The total PLMS were 131 with a resulting PLMS index of 54.77. Associated arousal with leg movement index was 7.1 . IMPRESSIONS  - She had limited sleep time during this study due to difficulty with sleep initiation and sleep maintenance.  - Her overall AHI was 0.  - She was noted to have mild snoring.  - The SpO2 low was 90%. She had the study conducted with her using 2 liters supplemental oxygen.  - She had an increase in her periodic limb movement index. DIAGNOSIS  - Obesity hypoventilation syndrome (E66.2)  - Pulmonary hypertension (I27.20) RECOMMENDATIONS  - Based on this study she does not qualify for PAP therapy. Of note is that she had reduced sleep efficiency during this study, and did not achieve REM sleep. Therefore any degree of sleep apnea she might have could be underrepresented in this study. If there is still significant concern that she could have sleep apnea, then options would be 1) to repeat an in lab sleep study with the use of a sleep aide to better insure adequate sleep efficiency, or 2) arrange for home sleep study. It might be best to also have her start the study on room air and add supplemental oxygen as needed.  - She should be assessed for the presence of restless leg  syndrome. [Electronically signed] 01/02/2017 11:33 PM Chesley Mires MD, [address], American Board of Sleep Medicine  NPI: [id number]

## 2017-01-02 NOTE — Patient Instructions (Signed)
No changes to medication at this time.  Routine lab work today. Will notify you of abnormal results, otherwise no news is good news!  Follow up with [name] in 2-3 months.  Do the following things EVERYDAY: 1) Weigh yourself in the morning before breakfast. Write it down and keep it in a log. 2) Take your medicines as prescribed 3) Eat low salt foods-Limit salt (sodium) to 2000 mg per day.  4) Stay as active as you can everyday 5) Limit all fluids for the day to less than 2 liters

## 2017-01-03 ENCOUNTER — Telehealth: Payer: Self-pay | Admitting: Internal Medicine

## 2017-01-03 ENCOUNTER — Encounter: Payer: Self-pay | Admitting: Internal Medicine

## 2017-01-03 DIAGNOSIS — M109 Gout, unspecified: Secondary | ICD-10-CM | POA: Diagnosis not present

## 2017-01-03 DIAGNOSIS — I890 Lymphedema, not elsewhere classified: Secondary | ICD-10-CM | POA: Diagnosis not present

## 2017-01-03 DIAGNOSIS — I2510 Atherosclerotic heart disease of native coronary artery without angina pectoris: Secondary | ICD-10-CM | POA: Diagnosis not present

## 2017-01-03 DIAGNOSIS — M1990 Unspecified osteoarthritis, unspecified site: Secondary | ICD-10-CM | POA: Diagnosis not present

## 2017-01-03 DIAGNOSIS — J9621 Acute and chronic respiratory failure with hypoxia: Secondary | ICD-10-CM | POA: Diagnosis not present

## 2017-01-03 DIAGNOSIS — I130 Hypertensive heart and chronic kidney disease with heart failure and stage 1 through stage 4 chronic kidney disease, or unspecified chronic kidney disease: Secondary | ICD-10-CM | POA: Diagnosis not present

## 2017-01-03 DIAGNOSIS — I872 Venous insufficiency (chronic) (peripheral): Secondary | ICD-10-CM | POA: Diagnosis not present

## 2017-01-03 DIAGNOSIS — I700 Atherosclerosis of aorta: Secondary | ICD-10-CM | POA: Diagnosis not present

## 2017-01-03 DIAGNOSIS — I5033 Acute on chronic diastolic (congestive) heart failure: Secondary | ICD-10-CM | POA: Diagnosis not present

## 2017-01-03 DIAGNOSIS — I2723 Pulmonary hypertension due to lung diseases and hypoxia: Secondary | ICD-10-CM | POA: Diagnosis not present

## 2017-01-03 DIAGNOSIS — E662 Morbid (severe) obesity with alveolar hypoventilation: Secondary | ICD-10-CM | POA: Diagnosis not present

## 2017-01-03 DIAGNOSIS — N183 Chronic kidney disease, stage 3 (moderate): Secondary | ICD-10-CM | POA: Diagnosis not present

## 2017-01-03 DIAGNOSIS — I482 Chronic atrial fibrillation: Secondary | ICD-10-CM | POA: Diagnosis not present

## 2017-01-03 NOTE — Telephone Encounter (Signed)
° ° °  [facility] call to say patient will start PT today

## 2017-01-03 NOTE — Telephone Encounter (Signed)
Noted. FYI

## 2017-01-04 ENCOUNTER — Other Ambulatory Visit (HOSPITAL_COMMUNITY): Payer: Self-pay | Admitting: Pharmacist

## 2017-01-04 MED ORDER — POTASSIUM CHLORIDE CRYS ER 10 MEQ PO TBCR: EXTENDED_RELEASE_TABLET | ORAL | 5 refills | Status: DC

## 2017-01-09 ENCOUNTER — Telehealth (HOSPITAL_COMMUNITY): Payer: Self-pay | Admitting: *Deleted

## 2017-01-09 ENCOUNTER — Telehealth: Payer: Self-pay | Admitting: Pulmonary Disease

## 2017-01-09 DIAGNOSIS — I2510 Atherosclerotic heart disease of native coronary artery without angina pectoris: Secondary | ICD-10-CM | POA: Diagnosis not present

## 2017-01-09 DIAGNOSIS — I890 Lymphedema, not elsewhere classified: Secondary | ICD-10-CM | POA: Diagnosis not present

## 2017-01-09 DIAGNOSIS — I872 Venous insufficiency (chronic) (peripheral): Secondary | ICD-10-CM | POA: Diagnosis not present

## 2017-01-09 DIAGNOSIS — J9621 Acute and chronic respiratory failure with hypoxia: Secondary | ICD-10-CM | POA: Diagnosis not present

## 2017-01-09 DIAGNOSIS — I2723 Pulmonary hypertension due to lung diseases and hypoxia: Secondary | ICD-10-CM | POA: Diagnosis not present

## 2017-01-09 DIAGNOSIS — I130 Hypertensive heart and chronic kidney disease with heart failure and stage 1 through stage 4 chronic kidney disease, or unspecified chronic kidney disease: Secondary | ICD-10-CM | POA: Diagnosis not present

## 2017-01-09 DIAGNOSIS — I482 Chronic atrial fibrillation: Secondary | ICD-10-CM | POA: Diagnosis not present

## 2017-01-09 DIAGNOSIS — M1990 Unspecified osteoarthritis, unspecified site: Secondary | ICD-10-CM | POA: Diagnosis not present

## 2017-01-09 DIAGNOSIS — J9611 Chronic respiratory failure with hypoxia: Secondary | ICD-10-CM

## 2017-01-09 DIAGNOSIS — N183 Chronic kidney disease, stage 3 (moderate): Secondary | ICD-10-CM | POA: Diagnosis not present

## 2017-01-09 DIAGNOSIS — I5033 Acute on chronic diastolic (congestive) heart failure: Secondary | ICD-10-CM | POA: Diagnosis not present

## 2017-01-09 DIAGNOSIS — I700 Atherosclerosis of aorta: Secondary | ICD-10-CM | POA: Diagnosis not present

## 2017-01-09 DIAGNOSIS — M109 Gout, unspecified: Secondary | ICD-10-CM | POA: Diagnosis not present

## 2017-01-09 DIAGNOSIS — E662 Morbid (severe) obesity with alveolar hypoventilation: Secondary | ICD-10-CM | POA: Diagnosis not present

## 2017-01-09 DIAGNOSIS — I5022 Chronic systolic (congestive) heart failure: Secondary | ICD-10-CM

## 2017-01-09 MED ORDER — TORSEMIDE 20 MG PO TABS: 40.0000 mg | ORAL_TABLET | Freq: Two times a day (BID) | ORAL | Status: DC

## 2017-01-09 NOTE — Telephone Encounter (Signed)
pulm rehab currently closed.   lmtcb X1 for Portia.

## 2017-01-09 NOTE — Telephone Encounter (Signed)
Patient called complaining of swelling in feet and ankles and weight up 3lbs.  Last week pt was told to stop spiro because of allergic reaction (rash).  Rash has cleared since stopping spiro.  Per Jonni Sanger pt to take Torsemide 46m bid today with an additional 174m of Potassium. Bmet Monday with RhRosaria FerriesPt aware and agreeable.

## 2017-01-10 ENCOUNTER — Other Ambulatory Visit: Payer: Self-pay | Admitting: *Deleted

## 2017-01-10 DIAGNOSIS — J9611 Chronic respiratory failure with hypoxia: Secondary | ICD-10-CM

## 2017-01-10 NOTE — Telephone Encounter (Signed)
Portia returning call.Hillery Hunter'

## 2017-01-10 NOTE — Telephone Encounter (Signed)
BQ  Please Advise-  Portia from Pulmonary Rehab called and stated pt was getting Pt at home but now she wants to come to Pulmonary rehab. Truddie Crumble states she see where an order was placed for Cardiac Rehab but pt's EF is too high. She wanted to know if we could place an order for Pulmonary Rehab instead

## 2017-01-10 NOTE — Telephone Encounter (Signed)
OK by me: reason chronic respiratory failure with hypoxemia

## 2017-01-10 NOTE — Telephone Encounter (Signed)
Order has been placed for the pt to be set up with pulmonary rehab.  Nothing further is needed.

## 2017-01-13 ENCOUNTER — Telehealth (HOSPITAL_COMMUNITY): Payer: Self-pay

## 2017-01-13 NOTE — Telephone Encounter (Signed)
Patient calling back to CHF clinic triage to report weight and swelling has come back (same as last weeks call, up 3 lbs). Per Oda Kilts PA-C advised to again increase torsemide and potassium to Torsemide 3m bid today with an additional 118m of Potassium. Patient scheduled to be seen by AnOda KiltsA-C next Tuesday. Advised to return call to clinic for any further s/s.  BrRenee PainRN

## 2017-01-16 ENCOUNTER — Ambulatory Visit: Payer: PPO | Admitting: Physician Assistant

## 2017-01-16 DIAGNOSIS — I700 Atherosclerosis of aorta: Secondary | ICD-10-CM | POA: Diagnosis not present

## 2017-01-16 DIAGNOSIS — N183 Chronic kidney disease, stage 3 (moderate): Secondary | ICD-10-CM | POA: Diagnosis not present

## 2017-01-16 DIAGNOSIS — I130 Hypertensive heart and chronic kidney disease with heart failure and stage 1 through stage 4 chronic kidney disease, or unspecified chronic kidney disease: Secondary | ICD-10-CM | POA: Diagnosis not present

## 2017-01-16 DIAGNOSIS — I482 Chronic atrial fibrillation: Secondary | ICD-10-CM | POA: Diagnosis not present

## 2017-01-16 DIAGNOSIS — I872 Venous insufficiency (chronic) (peripheral): Secondary | ICD-10-CM | POA: Diagnosis not present

## 2017-01-16 DIAGNOSIS — I2510 Atherosclerotic heart disease of native coronary artery without angina pectoris: Secondary | ICD-10-CM | POA: Diagnosis not present

## 2017-01-16 DIAGNOSIS — J9621 Acute and chronic respiratory failure with hypoxia: Secondary | ICD-10-CM | POA: Diagnosis not present

## 2017-01-16 DIAGNOSIS — I5033 Acute on chronic diastolic (congestive) heart failure: Secondary | ICD-10-CM | POA: Diagnosis not present

## 2017-01-16 DIAGNOSIS — I890 Lymphedema, not elsewhere classified: Secondary | ICD-10-CM | POA: Diagnosis not present

## 2017-01-16 DIAGNOSIS — E662 Morbid (severe) obesity with alveolar hypoventilation: Secondary | ICD-10-CM | POA: Diagnosis not present

## 2017-01-16 DIAGNOSIS — M1990 Unspecified osteoarthritis, unspecified site: Secondary | ICD-10-CM | POA: Diagnosis not present

## 2017-01-16 DIAGNOSIS — M109 Gout, unspecified: Secondary | ICD-10-CM | POA: Diagnosis not present

## 2017-01-16 DIAGNOSIS — I2723 Pulmonary hypertension due to lung diseases and hypoxia: Secondary | ICD-10-CM | POA: Diagnosis not present

## 2017-01-17 ENCOUNTER — Ambulatory Visit (HOSPITAL_COMMUNITY)
Admission: RE | Admit: 2017-01-17 | Discharge: 2017-01-17 | Disposition: A | Discharge status: Home / Self Care | Payer: PPO | Source: Ambulatory Visit | Attending: Cardiology | Admitting: Cardiology

## 2017-01-17 VITALS — BP 122/74 | HR 63 | Wt 227.0 lb

## 2017-01-17 DIAGNOSIS — Z833 Family history of diabetes mellitus: Secondary | ICD-10-CM | POA: Diagnosis not present

## 2017-01-17 DIAGNOSIS — J9612 Chronic respiratory failure with hypercapnia: Secondary | ICD-10-CM | POA: Diagnosis not present

## 2017-01-17 DIAGNOSIS — L270 Generalized skin eruption due to drugs and medicaments taken internally: Secondary | ICD-10-CM

## 2017-01-17 DIAGNOSIS — Z881 Allergy status to other antibiotic agents status: Secondary | ICD-10-CM | POA: Diagnosis not present

## 2017-01-17 DIAGNOSIS — I110 Hypertensive heart disease with heart failure: Secondary | ICD-10-CM | POA: Diagnosis not present

## 2017-01-17 DIAGNOSIS — I2721 Secondary pulmonary arterial hypertension: Secondary | ICD-10-CM | POA: Insufficient documentation

## 2017-01-17 DIAGNOSIS — M1990 Unspecified osteoarthritis, unspecified site: Secondary | ICD-10-CM | POA: Diagnosis not present

## 2017-01-17 DIAGNOSIS — R29898 Other symptoms and signs involving the musculoskeletal system: Secondary | ICD-10-CM

## 2017-01-17 DIAGNOSIS — I272 Pulmonary hypertension, unspecified: Secondary | ICD-10-CM

## 2017-01-17 DIAGNOSIS — E785 Hyperlipidemia, unspecified: Secondary | ICD-10-CM | POA: Diagnosis not present

## 2017-01-17 DIAGNOSIS — E662 Morbid (severe) obesity with alveolar hypoventilation: Secondary | ICD-10-CM | POA: Diagnosis not present

## 2017-01-17 DIAGNOSIS — G4733 Obstructive sleep apnea (adult) (pediatric): Secondary | ICD-10-CM | POA: Diagnosis not present

## 2017-01-17 DIAGNOSIS — Z8249 Family history of ischemic heart disease and other diseases of the circulatory system: Secondary | ICD-10-CM | POA: Diagnosis not present

## 2017-01-17 DIAGNOSIS — J9611 Chronic respiratory failure with hypoxia: Secondary | ICD-10-CM | POA: Insufficient documentation

## 2017-01-17 DIAGNOSIS — Z8262 Family history of osteoporosis: Secondary | ICD-10-CM | POA: Insufficient documentation

## 2017-01-17 DIAGNOSIS — Z888 Allergy status to other drugs, medicaments and biological substances status: Secondary | ICD-10-CM | POA: Insufficient documentation

## 2017-01-17 DIAGNOSIS — E039 Hypothyroidism, unspecified: Secondary | ICD-10-CM | POA: Insufficient documentation

## 2017-01-17 DIAGNOSIS — Z7901 Long term (current) use of anticoagulants: Secondary | ICD-10-CM | POA: Diagnosis not present

## 2017-01-17 DIAGNOSIS — I5032 Chronic diastolic (congestive) heart failure: Secondary | ICD-10-CM | POA: Insufficient documentation

## 2017-01-17 DIAGNOSIS — Z8673 Personal history of transient ischemic attack (TIA), and cerebral infarction without residual deficits: Secondary | ICD-10-CM | POA: Diagnosis not present

## 2017-01-17 DIAGNOSIS — Z800 Family history of malignant neoplasm of digestive organs: Secondary | ICD-10-CM | POA: Insufficient documentation

## 2017-01-17 DIAGNOSIS — Z86010 Personal history of colonic polyps: Secondary | ICD-10-CM | POA: Diagnosis not present

## 2017-01-17 DIAGNOSIS — I482 Chronic atrial fibrillation, unspecified: Secondary | ICD-10-CM

## 2017-01-17 DIAGNOSIS — Z96653 Presence of artificial knee joint, bilateral: Secondary | ICD-10-CM | POA: Diagnosis not present

## 2017-01-17 DIAGNOSIS — Z79899 Other long term (current) drug therapy: Secondary | ICD-10-CM | POA: Diagnosis not present

## 2017-01-17 DIAGNOSIS — E538 Deficiency of other specified B group vitamins: Secondary | ICD-10-CM | POA: Insufficient documentation

## 2017-01-17 DIAGNOSIS — Z825 Family history of asthma and other chronic lower respiratory diseases: Secondary | ICD-10-CM | POA: Insufficient documentation

## 2017-01-17 DIAGNOSIS — Z6841 Body Mass Index (BMI) 40.0 and over, adult: Secondary | ICD-10-CM | POA: Insufficient documentation

## 2017-01-17 LAB — BASIC METABOLIC PANEL
Anion gap: 9 (ref 5–15)
BUN: 24 mg/dL — AB (ref 6–20)
CO2: 24 mmol/L (ref 22–32)
CREATININE: 1.03 mg/dL — AB (ref 0.44–1.00)
Calcium: 9 mg/dL (ref 8.9–10.3)
Chloride: 104 mmol/L (ref 101–111)
GFR calc Af Amer: >60 mL/min (ref >60)
GFR, EST NON AFRICAN AMERICAN: 55 mL/min — AB (ref >60)
GLUCOSE: 90 mg/dL (ref 65–99)
Potassium: 3.3 mmol/L — ABNORMAL LOW (ref 3.5–5.1)
Sodium: 137 mmol/L (ref 135–145)

## 2017-01-17 MED ORDER — TORSEMIDE 20 MG PO TABS: ORAL_TABLET | ORAL | 6 refills | Status: DC
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
## 2017-01-17 NOTE — Patient Instructions (Signed)
Routine lab work today. Will notify you of abnormal results, otherwise no news is good news!  INCREASE Torsemide to 40 mg (2 tabs) in am and 20 mg (1 tab) in pm. Take extra 20 mg tablet once daily AS NEEDED for weight gain/swelling.  Follow up as scheduled with [name].  Do the following things EVERYDAY: 1) Weigh yourself in the morning before breakfast. Write it down and keep it in a log. 2) Take your medicines as prescribed 3) Eat low salt foods-Limit salt (sodium) to 2000 mg per day.  4) Stay as active as you can everyday 5) Limit all fluids for the day to less than 2 liters

## 2017-01-17 NOTE — Progress Notes (Signed)
Advanced Heart Failure Medication Review by a Pharmacist  Does the patient  feel that his/her medications are working for him/her?  yes  Has the patient been experiencing any side effects to the medications prescribed?  no  Does the patient measure his/her own blood pressure or blood glucose at home?  yes   Does the patient have any problems obtaining medications due to transportation or finances?   no  Understanding of regimen: good Understanding of indications: good Potential of compliance: good Patient understands to avoid NSAIDs. Patient understands to avoid decongestants.  Issues to address at subsequent visits: None   Pharmacist comments:  [name] is a pleasant 66 yo F presenting without a medication list but with a caregiver. She reports good compliance with her regimen but has been taking extra torsemide for the last 2 days for increased LE edema. She did not have any other medication-related questions or concerns for me at this time.   [name], PharmD, BCPS, CPP Clinical Pharmacist Pager: [phone] Phone: [phone] 01/17/2017 9:35 AM      Time with patient: 10 minutes Preparation and documentation time: 2 minutes Total time: 12 minutes

## 2017-01-18 ENCOUNTER — Other Ambulatory Visit (HOSPITAL_COMMUNITY): Payer: Self-pay | Admitting: *Deleted

## 2017-01-18 ENCOUNTER — Telehealth (HOSPITAL_COMMUNITY): Payer: Self-pay | Admitting: Cardiology

## 2017-01-18 MED ORDER — ACETAZOLAMIDE 250 MG PO TABS: 250.0000 mg | ORAL_TABLET | Freq: Every day | ORAL | 6 refills | Status: DC

## 2017-01-18 MED ORDER — POTASSIUM CHLORIDE CRYS ER 10 MEQ PO TBCR: 20.0000 meq | EXTENDED_RELEASE_TABLET | Freq: Two times a day (BID) | ORAL | 5 refills | Status: DC

## 2017-01-18 MED ORDER — SILDENAFIL CITRATE 20 MG PO TABS: 20.0000 mg | ORAL_TABLET | Freq: Three times a day (TID) | ORAL | 6 refills | Status: DC

## 2017-01-18 NOTE — Telephone Encounter (Signed)
Patient aware. Patient voiced understanding

## 2017-01-18 NOTE — Telephone Encounter (Signed)
-----  Message from Shirley Friar, PA-C sent at 01/17/2017 10:51 AM EDT ----- Confirm potassium dose ( Wasn't taking torsemide right, so ? Compliance.    Have take 60 meq TOTAL today. And increase to 20 meq BID moving forward.    Legrand Como [address] [place], PA-C 01/17/2017 10:51 AM

## 2017-01-19 ENCOUNTER — Ambulatory Visit: Payer: PPO | Admitting: Nurse Practitioner

## 2017-01-20 ENCOUNTER — Other Ambulatory Visit (HOSPITAL_COMMUNITY): Payer: Self-pay | Admitting: Pharmacist

## 2017-01-20 MED ORDER — ACETAZOLAMIDE 250 MG PO TABS: 250.0000 mg | ORAL_TABLET | Freq: Every day | ORAL | 6 refills | Status: DC

## 2017-01-20 MED ORDER — SILDENAFIL CITRATE 20 MG PO TABS: 20.0000 mg | ORAL_TABLET | Freq: Three times a day (TID) | ORAL | 6 refills | Status: DC

## 2017-01-23 ENCOUNTER — Ambulatory Visit: Payer: PPO | Admitting: Physician Assistant

## 2017-01-23 ENCOUNTER — Telehealth (HOSPITAL_COMMUNITY): Payer: Self-pay | Admitting: Pharmacist

## 2017-01-23 DIAGNOSIS — J9621 Acute and chronic respiratory failure with hypoxia: Secondary | ICD-10-CM | POA: Diagnosis not present

## 2017-01-23 DIAGNOSIS — M109 Gout, unspecified: Secondary | ICD-10-CM | POA: Diagnosis not present

## 2017-01-23 DIAGNOSIS — I2510 Atherosclerotic heart disease of native coronary artery without angina pectoris: Secondary | ICD-10-CM | POA: Diagnosis not present

## 2017-01-23 DIAGNOSIS — N183 Chronic kidney disease, stage 3 (moderate): Secondary | ICD-10-CM | POA: Diagnosis not present

## 2017-01-23 DIAGNOSIS — I890 Lymphedema, not elsewhere classified: Secondary | ICD-10-CM | POA: Diagnosis not present

## 2017-01-23 DIAGNOSIS — I130 Hypertensive heart and chronic kidney disease with heart failure and stage 1 through stage 4 chronic kidney disease, or unspecified chronic kidney disease: Secondary | ICD-10-CM | POA: Diagnosis not present

## 2017-01-23 DIAGNOSIS — I5033 Acute on chronic diastolic (congestive) heart failure: Secondary | ICD-10-CM | POA: Diagnosis not present

## 2017-01-23 DIAGNOSIS — M1990 Unspecified osteoarthritis, unspecified site: Secondary | ICD-10-CM | POA: Diagnosis not present

## 2017-01-23 DIAGNOSIS — E662 Morbid (severe) obesity with alveolar hypoventilation: Secondary | ICD-10-CM | POA: Diagnosis not present

## 2017-01-23 DIAGNOSIS — I482 Chronic atrial fibrillation: Secondary | ICD-10-CM | POA: Diagnosis not present

## 2017-01-23 DIAGNOSIS — I700 Atherosclerosis of aorta: Secondary | ICD-10-CM | POA: Diagnosis not present

## 2017-01-23 DIAGNOSIS — I2723 Pulmonary hypertension due to lung diseases and hypoxia: Secondary | ICD-10-CM | POA: Diagnosis not present

## 2017-01-23 DIAGNOSIS — I872 Venous insufficiency (chronic) (peripheral): Secondary | ICD-10-CM | POA: Diagnosis not present

## 2017-01-23 NOTE — Telephone Encounter (Signed)
Sildenafil 20 mg TID PA approved by [facility] through 09/04/17.   Ruta Hinds. Velva Harman, PharmD, BCPS, CPP Clinical Pharmacist Pager: [phone] Phone: [phone] 01/23/2017 9:24 AM

## 2017-01-24 ENCOUNTER — Telehealth: Payer: Self-pay | Admitting: Adult Health

## 2017-01-24 ENCOUNTER — Encounter: Payer: Self-pay | Admitting: Adult Health

## 2017-01-24 ENCOUNTER — Ambulatory Visit (INDEPENDENT_AMBULATORY_CARE_PROVIDER_SITE_OTHER): Payer: PPO | Admitting: Adult Health

## 2017-01-24 DIAGNOSIS — I5032 Chronic diastolic (congestive) heart failure: Secondary | ICD-10-CM | POA: Diagnosis not present

## 2017-01-24 DIAGNOSIS — I272 Pulmonary hypertension, unspecified: Secondary | ICD-10-CM | POA: Diagnosis not present

## 2017-01-24 DIAGNOSIS — G4733 Obstructive sleep apnea (adult) (pediatric): Secondary | ICD-10-CM
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
## 2017-01-24 NOTE — Progress Notes (Signed)
Reviewed and discussed with Tammy in clinic, agree

## 2017-01-24 NOTE — Assessment & Plan Note (Signed)
Continue on current regimen .   

## 2017-01-24 NOTE — Assessment & Plan Note (Signed)
OSA/OHS - very severe in 2012 on sleep study however repeat study does not show OSA , the study was limited with decreased sleep time and efficiency . Given her severe Pulmonary HTN would repeat study - do a Home Sleep study .  Ideally would be best to repeat in lab study but pt does not think she can sleep there even with sleep aid.  Plan  Repeat study - HST.

## 2017-01-24 NOTE — Telephone Encounter (Signed)
TP would like patient to have a home sleep study ATC patient, no answer and voicemail not set up yet [facility]

## 2017-01-24 NOTE — Patient Instructions (Signed)
Continue on Oxygen 2l/m .  Continue on current regimen  I will be in touch regarding decision for BIPAP At bedtime  . Follow up  Dr. Lake Bells in 2 months and As needed   Please contact office for sooner follow up if symptoms do not improve or worsen or seek emergency care

## 2017-01-24 NOTE — Assessment & Plan Note (Signed)
Cont on current regimen .

## 2017-01-24 NOTE — Telephone Encounter (Signed)
Patient returned call Discussed HST recommendations Pt would like to speak with someone about the HST before proceeding with scheduling Order placed with pt's request

## 2017-01-30 DIAGNOSIS — I5040 Unspecified combined systolic (congestive) and diastolic (congestive) heart failure: Secondary | ICD-10-CM | POA: Diagnosis not present

## 2017-01-30 DIAGNOSIS — J9610 Chronic respiratory failure, unspecified whether with hypoxia or hypercapnia: Secondary | ICD-10-CM | POA: Diagnosis not present

## 2017-01-30 DIAGNOSIS — R269 Unspecified abnormalities of gait and mobility: Secondary | ICD-10-CM | POA: Diagnosis not present

## 2017-02-01 DIAGNOSIS — I700 Atherosclerosis of aorta: Secondary | ICD-10-CM | POA: Diagnosis not present

## 2017-02-01 DIAGNOSIS — I890 Lymphedema, not elsewhere classified: Secondary | ICD-10-CM | POA: Diagnosis not present

## 2017-02-01 DIAGNOSIS — I130 Hypertensive heart and chronic kidney disease with heart failure and stage 1 through stage 4 chronic kidney disease, or unspecified chronic kidney disease: Secondary | ICD-10-CM | POA: Diagnosis not present

## 2017-02-01 DIAGNOSIS — M1990 Unspecified osteoarthritis, unspecified site: Secondary | ICD-10-CM | POA: Diagnosis not present

## 2017-02-01 DIAGNOSIS — I2510 Atherosclerotic heart disease of native coronary artery without angina pectoris: Secondary | ICD-10-CM | POA: Diagnosis not present

## 2017-02-01 DIAGNOSIS — E662 Morbid (severe) obesity with alveolar hypoventilation: Secondary | ICD-10-CM | POA: Diagnosis not present

## 2017-02-01 DIAGNOSIS — J9621 Acute and chronic respiratory failure with hypoxia: Secondary | ICD-10-CM | POA: Diagnosis not present

## 2017-02-01 DIAGNOSIS — N183 Chronic kidney disease, stage 3 (moderate): Secondary | ICD-10-CM | POA: Diagnosis not present

## 2017-02-01 DIAGNOSIS — I5033 Acute on chronic diastolic (congestive) heart failure: Secondary | ICD-10-CM | POA: Diagnosis not present

## 2017-02-01 DIAGNOSIS — I872 Venous insufficiency (chronic) (peripheral): Secondary | ICD-10-CM | POA: Diagnosis not present

## 2017-02-01 DIAGNOSIS — M109 Gout, unspecified: Secondary | ICD-10-CM | POA: Diagnosis not present

## 2017-02-01 DIAGNOSIS — I2723 Pulmonary hypertension due to lung diseases and hypoxia: Secondary | ICD-10-CM | POA: Diagnosis not present

## 2017-02-01 DIAGNOSIS — I482 Chronic atrial fibrillation: Secondary | ICD-10-CM | POA: Diagnosis not present

## 2017-02-03 DIAGNOSIS — G4733 Obstructive sleep apnea (adult) (pediatric): Secondary | ICD-10-CM | POA: Diagnosis not present

## 2017-02-04 ENCOUNTER — Telehealth: Payer: Self-pay | Admitting: Internal Medicine

## 2017-02-04 DIAGNOSIS — I5032 Chronic diastolic (congestive) heart failure: Secondary | ICD-10-CM

## 2017-02-04 NOTE — Telephone Encounter (Signed)
Says did a home sleep test but only did it for 3h.because for inexplicable reasons kept waking up. Wants to know if she can repeat it tonight and if data will still be valid. Triage/Tammy: please address Monday morning 02/07/17  Dr. Brand Males, M.D., F.C.C.P Pulmonary and Critical Care Medicine Staff Physician [place] [facility] Pager: [phone], If no answer or between  15:00h - 7:00h: call [phone]  02/04/2017 3:36 PM

## 2017-02-05 ENCOUNTER — Ambulatory Visit (HOSPITAL_BASED_OUTPATIENT_CLINIC_OR_DEPARTMENT_OTHER): Payer: PPO

## 2017-02-06 DIAGNOSIS — I482 Chronic atrial fibrillation: Secondary | ICD-10-CM | POA: Diagnosis not present

## 2017-02-06 DIAGNOSIS — M109 Gout, unspecified: Secondary | ICD-10-CM | POA: Diagnosis not present

## 2017-02-06 DIAGNOSIS — E662 Morbid (severe) obesity with alveolar hypoventilation: Secondary | ICD-10-CM | POA: Diagnosis not present

## 2017-02-06 DIAGNOSIS — J9621 Acute and chronic respiratory failure with hypoxia: Secondary | ICD-10-CM | POA: Diagnosis not present

## 2017-02-06 DIAGNOSIS — I2723 Pulmonary hypertension due to lung diseases and hypoxia: Secondary | ICD-10-CM | POA: Diagnosis not present

## 2017-02-06 DIAGNOSIS — I5033 Acute on chronic diastolic (congestive) heart failure: Secondary | ICD-10-CM | POA: Diagnosis not present

## 2017-02-06 DIAGNOSIS — I872 Venous insufficiency (chronic) (peripheral): Secondary | ICD-10-CM | POA: Diagnosis not present

## 2017-02-06 DIAGNOSIS — I2510 Atherosclerotic heart disease of native coronary artery without angina pectoris: Secondary | ICD-10-CM | POA: Diagnosis not present

## 2017-02-06 DIAGNOSIS — I130 Hypertensive heart and chronic kidney disease with heart failure and stage 1 through stage 4 chronic kidney disease, or unspecified chronic kidney disease: Secondary | ICD-10-CM | POA: Diagnosis not present

## 2017-02-06 DIAGNOSIS — M1990 Unspecified osteoarthritis, unspecified site: Secondary | ICD-10-CM | POA: Diagnosis not present

## 2017-02-06 DIAGNOSIS — N183 Chronic kidney disease, stage 3 (moderate): Secondary | ICD-10-CM | POA: Diagnosis not present

## 2017-02-06 DIAGNOSIS — I890 Lymphedema, not elsewhere classified: Secondary | ICD-10-CM | POA: Diagnosis not present

## 2017-02-06 DIAGNOSIS — I700 Atherosclerosis of aorta: Secondary | ICD-10-CM | POA: Diagnosis not present

## 2017-02-07 ENCOUNTER — Telehealth: Payer: Self-pay | Admitting: Internal Medicine

## 2017-02-07 NOTE — Telephone Encounter (Signed)
Spoke with Darlina Guys charge nurse from [facility] and per dr Regis Bill okay to continue withy orders.

## 2017-02-07 NOTE — Telephone Encounter (Signed)
Ok to continue as requested

## 2017-02-07 NOTE — Telephone Encounter (Signed)
Please advise

## 2017-02-07 NOTE — Telephone Encounter (Signed)
[name] w/[facility] need verbal orders to continue to PT for the pt to work on Activity tolerance and strength.

## 2017-02-09 NOTE — Telephone Encounter (Signed)
Called and spoke to pt. Pt states she does not want to repeat the sleep study if she does not have to. Pt is questioning if there is enough data to gather conclusive results from. Advised pt MR will consult with his partners to determine if there is enough data to determine if a repeat test is needed.   MR please advise. Thanks.

## 2017-02-10 DIAGNOSIS — G4733 Obstructive sleep apnea (adult) (pediatric): Secondary | ICD-10-CM | POA: Diagnosis not present

## 2017-02-13 DIAGNOSIS — I5040 Unspecified combined systolic (congestive) and diastolic (congestive) heart failure: Secondary | ICD-10-CM | POA: Diagnosis not present

## 2017-02-13 DIAGNOSIS — I2510 Atherosclerotic heart disease of native coronary artery without angina pectoris: Secondary | ICD-10-CM | POA: Diagnosis not present

## 2017-02-13 DIAGNOSIS — I2723 Pulmonary hypertension due to lung diseases and hypoxia: Secondary | ICD-10-CM | POA: Diagnosis not present

## 2017-02-13 DIAGNOSIS — I482 Chronic atrial fibrillation: Secondary | ICD-10-CM | POA: Diagnosis not present

## 2017-02-13 DIAGNOSIS — J9610 Chronic respiratory failure, unspecified whether with hypoxia or hypercapnia: Secondary | ICD-10-CM | POA: Diagnosis not present

## 2017-02-13 DIAGNOSIS — M1990 Unspecified osteoarthritis, unspecified site: Secondary | ICD-10-CM | POA: Diagnosis not present

## 2017-02-13 DIAGNOSIS — E662 Morbid (severe) obesity with alveolar hypoventilation: Secondary | ICD-10-CM | POA: Diagnosis not present

## 2017-02-13 DIAGNOSIS — R269 Unspecified abnormalities of gait and mobility: Secondary | ICD-10-CM | POA: Diagnosis not present

## 2017-02-13 DIAGNOSIS — N183 Chronic kidney disease, stage 3 (moderate): Secondary | ICD-10-CM | POA: Diagnosis not present

## 2017-02-13 DIAGNOSIS — I130 Hypertensive heart and chronic kidney disease with heart failure and stage 1 through stage 4 chronic kidney disease, or unspecified chronic kidney disease: Secondary | ICD-10-CM | POA: Diagnosis not present

## 2017-02-13 DIAGNOSIS — I872 Venous insufficiency (chronic) (peripheral): Secondary | ICD-10-CM | POA: Diagnosis not present

## 2017-02-13 DIAGNOSIS — I5033 Acute on chronic diastolic (congestive) heart failure: Secondary | ICD-10-CM | POA: Diagnosis not present

## 2017-02-13 DIAGNOSIS — J9621 Acute and chronic respiratory failure with hypoxia: Secondary | ICD-10-CM | POA: Diagnosis not present

## 2017-02-13 DIAGNOSIS — I890 Lymphedema, not elsewhere classified: Secondary | ICD-10-CM | POA: Diagnosis not present

## 2017-02-13 DIAGNOSIS — I700 Atherosclerosis of aorta: Secondary | ICD-10-CM | POA: Diagnosis not present

## 2017-02-13 DIAGNOSIS — M109 Gout, unspecified: Secondary | ICD-10-CM | POA: Diagnosis not present

## 2017-02-13 NOTE — Telephone Encounter (Signed)
She is NOT my patient. I just took a weekend call with her question. I think she is either VS or BQ patient but last seen by TP. One of them has to address this question  Dr. Brand Males, M.D., [facility].C.P Pulmonary and Critical Care Medicine Staff Physician [facility] Pager: [phone], If no answer or between  15:00h - 7:00h: call [phone]  02/13/2017 12:18 PM

## 2017-02-13 NOTE — Telephone Encounter (Signed)
Please advise BQ. Thanks.

## 2017-02-13 NOTE — Telephone Encounter (Signed)
Based on her medical problems she may benefit from BIPAP without having a sleep study.  I believe she needs this considering the severity of her respiratory failure and CHF.  We can start by having her get a daytime ABG at respiratory, please order and then depending on the results we may just order BIPAP.

## 2017-02-14 NOTE — Telephone Encounter (Signed)
Spoke with pt about BQ's message. Pt agreed to the additional testing and the order was placed. She had no further questions at this time. Nothing further is needed

## 2017-02-15 ENCOUNTER — Other Ambulatory Visit: Payer: Self-pay | Admitting: *Deleted

## 2017-02-15 DIAGNOSIS — I272 Pulmonary hypertension, unspecified: Secondary | ICD-10-CM

## 2017-02-15 DIAGNOSIS — G4733 Obstructive sleep apnea (adult) (pediatric): Secondary | ICD-10-CM

## 2017-02-20 DIAGNOSIS — I482 Chronic atrial fibrillation: Secondary | ICD-10-CM | POA: Diagnosis not present

## 2017-02-20 DIAGNOSIS — J9621 Acute and chronic respiratory failure with hypoxia: Secondary | ICD-10-CM | POA: Diagnosis not present

## 2017-02-20 DIAGNOSIS — I2723 Pulmonary hypertension due to lung diseases and hypoxia: Secondary | ICD-10-CM | POA: Diagnosis not present

## 2017-02-20 DIAGNOSIS — I700 Atherosclerosis of aorta: Secondary | ICD-10-CM | POA: Diagnosis not present

## 2017-02-20 DIAGNOSIS — I872 Venous insufficiency (chronic) (peripheral): Secondary | ICD-10-CM | POA: Diagnosis not present

## 2017-02-20 DIAGNOSIS — M1990 Unspecified osteoarthritis, unspecified site: Secondary | ICD-10-CM | POA: Diagnosis not present

## 2017-02-20 DIAGNOSIS — I130 Hypertensive heart and chronic kidney disease with heart failure and stage 1 through stage 4 chronic kidney disease, or unspecified chronic kidney disease: Secondary | ICD-10-CM | POA: Diagnosis not present

## 2017-02-20 DIAGNOSIS — E662 Morbid (severe) obesity with alveolar hypoventilation: Secondary | ICD-10-CM | POA: Diagnosis not present

## 2017-02-20 DIAGNOSIS — I2510 Atherosclerotic heart disease of native coronary artery without angina pectoris: Secondary | ICD-10-CM | POA: Diagnosis not present

## 2017-02-20 DIAGNOSIS — I890 Lymphedema, not elsewhere classified: Secondary | ICD-10-CM | POA: Diagnosis not present

## 2017-02-20 DIAGNOSIS — I5033 Acute on chronic diastolic (congestive) heart failure: Secondary | ICD-10-CM | POA: Diagnosis not present

## 2017-02-20 DIAGNOSIS — M109 Gout, unspecified: Secondary | ICD-10-CM | POA: Diagnosis not present

## 2017-02-20 DIAGNOSIS — N183 Chronic kidney disease, stage 3 (moderate): Secondary | ICD-10-CM | POA: Diagnosis not present

## 2017-02-23 DIAGNOSIS — I130 Hypertensive heart and chronic kidney disease with heart failure and stage 1 through stage 4 chronic kidney disease, or unspecified chronic kidney disease: Secondary | ICD-10-CM | POA: Diagnosis not present

## 2017-02-23 DIAGNOSIS — I2723 Pulmonary hypertension due to lung diseases and hypoxia: Secondary | ICD-10-CM | POA: Diagnosis not present

## 2017-02-23 DIAGNOSIS — I5033 Acute on chronic diastolic (congestive) heart failure: Secondary | ICD-10-CM | POA: Diagnosis not present

## 2017-02-27 ENCOUNTER — Ambulatory Visit (HOSPITAL_COMMUNITY): Payer: PPO
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
## 2017-02-28 ENCOUNTER — Ambulatory Visit (INDEPENDENT_AMBULATORY_CARE_PROVIDER_SITE_OTHER): Payer: PPO | Admitting: Internal Medicine

## 2017-02-28 VITALS — BP 90/60 | HR 62 | Temp 98.2°F

## 2017-02-28 DIAGNOSIS — E876 Hypokalemia: Secondary | ICD-10-CM

## 2017-02-28 DIAGNOSIS — I272 Pulmonary hypertension, unspecified: Secondary | ICD-10-CM | POA: Diagnosis not present

## 2017-02-28 DIAGNOSIS — I872 Venous insufficiency (chronic) (peripheral): Secondary | ICD-10-CM | POA: Diagnosis not present

## 2017-02-28 DIAGNOSIS — I700 Atherosclerosis of aorta: Secondary | ICD-10-CM | POA: Diagnosis not present

## 2017-02-28 DIAGNOSIS — J9610 Chronic respiratory failure, unspecified whether with hypoxia or hypercapnia: Secondary | ICD-10-CM

## 2017-02-28 DIAGNOSIS — I2510 Atherosclerotic heart disease of native coronary artery without angina pectoris: Secondary | ICD-10-CM | POA: Diagnosis not present

## 2017-02-28 DIAGNOSIS — I130 Hypertensive heart and chronic kidney disease with heart failure and stage 1 through stage 4 chronic kidney disease, or unspecified chronic kidney disease: Secondary | ICD-10-CM | POA: Diagnosis not present

## 2017-02-28 DIAGNOSIS — M109 Gout, unspecified: Secondary | ICD-10-CM | POA: Diagnosis not present

## 2017-02-28 DIAGNOSIS — Z79899 Other long term (current) drug therapy: Secondary | ICD-10-CM

## 2017-02-28 DIAGNOSIS — M1990 Unspecified osteoarthritis, unspecified site: Secondary | ICD-10-CM | POA: Diagnosis not present

## 2017-02-28 DIAGNOSIS — D649 Anemia, unspecified: Secondary | ICD-10-CM

## 2017-02-28 DIAGNOSIS — D519 Vitamin B12 deficiency anemia, unspecified: Secondary | ICD-10-CM

## 2017-02-28 DIAGNOSIS — N183 Chronic kidney disease, stage 3 (moderate): Secondary | ICD-10-CM | POA: Diagnosis not present

## 2017-02-28 DIAGNOSIS — E662 Morbid (severe) obesity with alveolar hypoventilation: Secondary | ICD-10-CM | POA: Diagnosis not present

## 2017-02-28 DIAGNOSIS — I2723 Pulmonary hypertension due to lung diseases and hypoxia: Secondary | ICD-10-CM | POA: Diagnosis not present

## 2017-02-28 DIAGNOSIS — E790 Hyperuricemia without signs of inflammatory arthritis and tophaceous disease: Secondary | ICD-10-CM

## 2017-02-28 DIAGNOSIS — I890 Lymphedema, not elsewhere classified: Secondary | ICD-10-CM | POA: Diagnosis not present

## 2017-02-28 DIAGNOSIS — I482 Chronic atrial fibrillation: Secondary | ICD-10-CM | POA: Diagnosis not present

## 2017-02-28 DIAGNOSIS — I5033 Acute on chronic diastolic (congestive) heart failure: Secondary | ICD-10-CM | POA: Diagnosis not present

## 2017-02-28 DIAGNOSIS — J9621 Acute and chronic respiratory failure with hypoxia: Secondary | ICD-10-CM | POA: Diagnosis not present

## 2017-02-28 LAB — CBC WITH DIFFERENTIAL/PLATELET
Basophils Absolute: 0 10*3/uL (ref 0.0–0.1)
Basophils Relative: 0.8 % (ref 0.0–3.0)
Eosinophils Absolute: 0.3 10*3/uL (ref 0.0–0.7)
Eosinophils Relative: 4.8 % (ref 0.0–5.0)
HCT: 33.7 % — ABNORMAL LOW (ref 36.0–46.0)
Hemoglobin: 10.8 g/dL — ABNORMAL LOW (ref 12.0–15.0)
Lymphocytes Relative: 12.3 % (ref 12.0–46.0)
Lymphs Abs: 0.7 10*3/uL (ref 0.7–4.0)
MCHC: 31.9 g/dL (ref 30.0–36.0)
MCV: 94.2 fl (ref 78.0–100.0)
Monocytes Absolute: 0.4 10*3/uL (ref 0.1–1.0)
Monocytes Relative: 6.8 % (ref 3.0–12.0)
Neutro Abs: 4.5 10*3/uL (ref 1.4–7.7)
Neutrophils Relative %: 75.3 % (ref 43.0–77.0)
Platelets: 183 10*3/uL (ref 150.0–400.0)
RBC: 3.58 Mil/uL — ABNORMAL LOW (ref 3.87–5.11)
RDW: 16.9 % — ABNORMAL HIGH (ref 11.5–15.5)
WBC: 6 10*3/uL (ref 4.0–10.5)

## 2017-02-28 LAB — BASIC METABOLIC PANEL
BUN: 37 mg/dL — ABNORMAL HIGH (ref 6–23)
CO2: 29 mEq/L (ref 19–32)
Calcium: 9.9 mg/dL (ref 8.4–10.5)
Chloride: 103 mEq/L (ref 96–112)
Creatinine, Ser: 1.3 mg/dL — ABNORMAL HIGH (ref 0.40–1.20)
GFR: 43.36 mL/min — ABNORMAL LOW (ref >60.00)
Glucose, Bld: 98 mg/dL (ref 70–99)
Potassium: 3.6 mEq/L (ref 3.5–5.1)
Sodium: 140 mEq/L (ref 135–145)

## 2017-02-28 LAB — URIC ACID: Uric Acid, Serum: 5.9 mg/dL (ref 2.4–7.0)

## 2017-02-28 MED ORDER — CYANOCOBALAMIN 1000 MCG/ML IJ SOLN
1000.0000 ug | Freq: Once | INTRAMUSCULAR | Status: AC
  Administered 2017-02-28: 1000 ug via INTRAMUSCULAR

## 2017-02-28 NOTE — Patient Instructions (Addendum)
Ok to try oral  b12  And vit d  .   And omega 3 with disc as  Discussed . Marland Kitchen   Will notify you  of labs when available.  and then plan for follow up.   6 months

## 2017-03-01 ENCOUNTER — Ambulatory Visit (HOSPITAL_COMMUNITY)
Admission: RE | Admit: 2017-03-01 | Discharge: 2017-03-01 | Disposition: A | Discharge status: Home / Self Care | Payer: PPO | Source: Ambulatory Visit | Attending: Pulmonary Disease | Admitting: Pulmonary Disease

## 2017-03-01 ENCOUNTER — Telehealth: Payer: Self-pay | Admitting: Pulmonary Disease

## 2017-03-01 DIAGNOSIS — G4733 Obstructive sleep apnea (adult) (pediatric): Secondary | ICD-10-CM

## 2017-03-01 DIAGNOSIS — I5032 Chronic diastolic (congestive) heart failure: Secondary | ICD-10-CM | POA: Diagnosis not present

## 2017-03-01 LAB — BLOOD GAS, ARTERIAL
ACID-BASE DEFICIT: 0.8 mmol/L (ref 0.0–2.0)
BICARBONATE: 22.8 mmol/L (ref 20.0–28.0)
DRAWN BY: 244901
O2 CONTENT: 2 L/min
O2 Saturation: 94 %
PATIENT TEMPERATURE: 98.6
PO2 ART: 73.5 mmHg — AB (ref 83.0–108.0)
pCO2 arterial: 35.9 mmHg (ref 32.0–48.0)
pH, Arterial: 7.42 (ref 7.350–7.450)

## 2017-03-01 LAB — IRON,TIBC AND FERRITIN PANEL
%SAT: 16 % (ref 11–50)
FERRITIN: 49 ng/mL (ref 20–288)
Iron: 67 ug/dL (ref 45–160)
TIBC: 417 ug/dL (ref 250–450)

## 2017-03-01 NOTE — Telephone Encounter (Signed)
A, Based on her blood gas it looks like she could use an autotitrating CPAP machine and she doesn't need BIPAP. Will route to Tammy to make sure she agrees, but I can't find another reason for her to need BIPAP. However she does need CPAP. Would rec Resmed 10 set at 5-20cm H20 Thanks, B -------- Spoke with pt, aware of results/recs.  cpap ordered.  Nothing further needed.

## 2017-03-01 NOTE — Telephone Encounter (Signed)
Pt called back with a question regarding her dx.  Questions answered.  Nothing further needed.

## 2017-03-02 ENCOUNTER — Telehealth: Payer: Self-pay | Admitting: Internal Medicine

## 2017-03-02 ENCOUNTER — Other Ambulatory Visit: Payer: Self-pay | Admitting: Emergency Medicine

## 2017-03-02 ENCOUNTER — Telehealth: Payer: Self-pay | Admitting: Pulmonary Disease

## 2017-03-02 ENCOUNTER — Encounter: Payer: Self-pay | Admitting: Internal Medicine

## 2017-03-02 DIAGNOSIS — I5040 Unspecified combined systolic (congestive) and diastolic (congestive) heart failure: Secondary | ICD-10-CM | POA: Diagnosis not present

## 2017-03-02 DIAGNOSIS — R269 Unspecified abnormalities of gait and mobility: Secondary | ICD-10-CM | POA: Diagnosis not present

## 2017-03-02 DIAGNOSIS — G4733 Obstructive sleep apnea (adult) (pediatric): Secondary | ICD-10-CM

## 2017-03-02 DIAGNOSIS — J9610 Chronic respiratory failure, unspecified whether with hypoxia or hypercapnia: Secondary | ICD-10-CM | POA: Diagnosis not present

## 2017-03-02 MED ORDER — COLCHICINE 0.6 MG PO TABS: 0.6000 mg | ORAL_TABLET | Freq: Every day | ORAL | 1 refills | Status: DC | PRN

## 2017-03-02 MED ORDER — TRAMADOL HCL 50 MG PO TABS: ORAL_TABLET | ORAL | 0 refills | Status: DC

## 2017-03-02 NOTE — Telephone Encounter (Signed)
k to refill the cochicine  Disp 60 refill x 1

## 2017-03-02 NOTE — Telephone Encounter (Signed)
Prescription has been filled

## 2017-03-02 NOTE — Telephone Encounter (Signed)
Please advise. Tramadol has already been sent in for 90 day. Patient would like a refill for the colchicine 0.79m

## 2017-03-02 NOTE — Telephone Encounter (Signed)
Pt has new pharm [facility] [address]. Pt needs tramadol 50 mg #90 w/refills and colchicine 0.6 mg #30.

## 2017-03-02 NOTE — Telephone Encounter (Signed)
lmtcb X1 for [employer]

## 2017-03-03 NOTE — Telephone Encounter (Signed)
[name] returning call - He can be reached at [phone] -pr

## 2017-03-03 NOTE — Telephone Encounter (Signed)
lmomtcb x2 for Barbaraann Rondo with [facility]>

## 2017-03-03 NOTE — Telephone Encounter (Signed)
Order placed.  Pt aware.  Nothing further needed.

## 2017-03-03 NOTE — Telephone Encounter (Signed)
OK

## 2017-03-03 NOTE — Telephone Encounter (Signed)
Spoke with Barbaraann Rondo at [facility], requesting an order from our office to have pt set on a loaner cpap machine for 2 weeks on the autotitrating setting to get a set pressure for the pt, so that way her machine can be set at a set pressure.  BQ please advise if ok to order.  Thanks!

## 2017-03-06 ENCOUNTER — Encounter (HOSPITAL_COMMUNITY): Payer: Self-pay | Admitting: Cardiology

## 2017-03-06 ENCOUNTER — Telehealth: Payer: Self-pay | Admitting: Pulmonary Disease

## 2017-03-06 ENCOUNTER — Ambulatory Visit (HOSPITAL_COMMUNITY)
Admission: RE | Admit: 2017-03-06 | Discharge: 2017-03-06 | Disposition: A | Discharge status: Home / Self Care | Payer: PPO | Source: Ambulatory Visit | Attending: Cardiology | Admitting: Cardiology

## 2017-03-06 VITALS — BP 94/62 | HR 65 | Wt 224.2 lb

## 2017-03-06 DIAGNOSIS — I482 Chronic atrial fibrillation, unspecified: Secondary | ICD-10-CM

## 2017-03-06 DIAGNOSIS — J9612 Chronic respiratory failure with hypercapnia: Secondary | ICD-10-CM | POA: Insufficient documentation

## 2017-03-06 DIAGNOSIS — G4733 Obstructive sleep apnea (adult) (pediatric): Secondary | ICD-10-CM | POA: Diagnosis not present

## 2017-03-06 DIAGNOSIS — Z8673 Personal history of transient ischemic attack (TIA), and cerebral infarction without residual deficits: Secondary | ICD-10-CM | POA: Insufficient documentation

## 2017-03-06 DIAGNOSIS — Z7901 Long term (current) use of anticoagulants: Secondary | ICD-10-CM | POA: Diagnosis not present

## 2017-03-06 DIAGNOSIS — J9611 Chronic respiratory failure with hypoxia: Secondary | ICD-10-CM | POA: Insufficient documentation

## 2017-03-06 DIAGNOSIS — I110 Hypertensive heart disease with heart failure: Secondary | ICD-10-CM | POA: Diagnosis not present

## 2017-03-06 DIAGNOSIS — E039 Hypothyroidism, unspecified: Secondary | ICD-10-CM | POA: Insufficient documentation

## 2017-03-06 DIAGNOSIS — I272 Pulmonary hypertension, unspecified: Secondary | ICD-10-CM | POA: Diagnosis not present

## 2017-03-06 DIAGNOSIS — I2721 Secondary pulmonary arterial hypertension: Secondary | ICD-10-CM | POA: Insufficient documentation

## 2017-03-06 DIAGNOSIS — I5032 Chronic diastolic (congestive) heart failure: Secondary | ICD-10-CM | POA: Insufficient documentation

## 2017-03-06 MED ORDER — SILDENAFIL CITRATE 20 MG PO TABS: 40.0000 mg | ORAL_TABLET | Freq: Three times a day (TID) | ORAL | 6 refills | Status: DC

## 2017-03-06 MED ORDER — TORSEMIDE 20 MG PO TABS: 40.0000 mg | ORAL_TABLET | Freq: Two times a day (BID) | ORAL | 6 refills | Status: DC

## 2017-03-06 NOTE — Progress Notes (Signed)
Advanced Heart Failure Clinic Note   Primary Care: Dr. [name]  HF Cardiology: [name]  Pulm: Dr. Lake Bells  HPI:  [name] is a 66 y.o. female with PMH of pulmonary HTN, morbid obesity, chronic afib (on Xarelto, CVA in 2012, OHS/OSA (Had U PE3 surgery, and chronic respiratory failure with hypoxemia on continuous 02 at 2 lpm via [place]).  Admitted 3/2 ->4/00/86 with A/C diastolic CHF and A/C respiratory failure. Pt initially refused Bipap so venti mask used. Eventually tolerated transition to BiPAP. Overall pt diuresed from 285 lbs down to 229 lbs with 38 L of diuresis.  Echo 11/07/16 EF 55-60%, PASP 87m Hg, mildly dilated RV with moderate to severely decreased RV systolic function. RHC/LHC in 3/18 showed no coronary disease and confirmed severe PH.    Patient returns for followup today. BP is low chronically, SBP in 90s.  No lightheadedness.  Weight is down 3 lbs.  She is using oxygen during the day and Bipap at night. She is short of breath after walking for about 5 minutes.  Unable to do 6 minute walk today due to back pain.  No chest pian.  No orthopnea/PND.    PMH 1. Chronic diastolic CHF with severe pulmonary hypertension and prominent RV failure:  Echo (3/18) with EF 55-60%, PASP 934mHg, mildly dilated RV with moderate to severely decreased RV systolic function. - RHC/LHC 11/07/16: No angiographic CAD; PA 90/55, LVEDP 23 (PCWP inaccurate), CI 2.15 Fick/2.32 Thermo, shunt run negative, PVR 5.7 WU.  2. Chronic hypercarbic/hypoxic respiratory failure with OHS/OSA. Sees Dr. McLake BellsUsing nightly BiPAP and oxygen by nasal cannula during the day.  3. Atrial fibrillation: Chronic - Rate controlled on Xarelto + atenolol  4. 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 PVR 5.7 WU on RHC 3/18. Suspect group 2 (elevated LA pressure) and group 3 (OHS/OSA) PH. However, cannot rule out group 1 component. She had V/Q scan that was not suggestive of  chronic PE and high resolution chest CT that was not suggestive of ILD. ANA, RF, anti-SCL70 all negative.   5. H/o CVA 6. Hypothyroidism 7. H/o TKR   Current Outpatient Prescriptions  Medication Sig Dispense Refill  . acetaZOLAMIDE (DIAMOX) 250 MG tablet Take 1 tablet (250 mg total) by mouth daily. 30 tablet 6  . atenolol (TENORMIN) 50 MG tablet Take 50 mg by mouth daily. Take an extra 1/2 tab by mouth at bedtime    . colchicine 0.6 MG tablet Take 1 tablet (0.6 mg total) by mouth daily as needed (flareups). 60 tablet 1  . diclofenac sodium (VOLTAREN) 1 % GEL Apply 2 g topically 2 (two) times daily as needed (for arthritis).     . Marland Kitchenevothyroxine (SYNTHROID, LEVOTHROID) 75 MCG tablet TAKE 1 TABLET BY MOUTH EVERY DAY 90 tablet 2  . OXYGEN Inhale 2 L into the lungs continuous.     . potassium chloride (K-DUR) 10 MEQ tablet Take 20 mEq by mouth 2 (two) times daily.    . rivaroxaban (XARELTO) 20 MG TABS tablet Take 1 tablet (20 mg total) by mouth daily with supper. 28 tablet 0  . sildenafil (REVATIO) 20 MG tablet Take 2 tablets (40 mg total) by mouth 3 (three) times daily. 180 tablet 6  . torsemide (DEMADEX) 20 MG tablet Take 2 tablets (40 mg total) by mouth 2 (two) times daily. 120 tablet 6  . traMADol (ULTRAM) 50 MG tablet TAKE 1 TABLET BY MOUTH 3 TIMES A DAY AS NEEDED FOR PAIN 90  tablet 0   No current facility-administered medications for this encounter.     Allergies  Allergen Reactions  . Ambien [Zolpidem Tartrate] Other (See Comments)    Amnesia and fall   . Losartan Other (See Comments)    Elevated creatinine and swelling  . Prevacid [Lansoprazole] Swelling  . Adhesive [Tape] Other (See Comments)    Adhesive tape and band aids " irritate, " causes blisters and pulls skin when removing. Okay to use paper tape  . Keflex [Cephalexin] Swelling    Swelling in ankles, feet  . Motrin [Ibuprofen] Other (See Comments)    ANKLE EDEMA   . Prilosec [Omeprazole] Other (See Comments)     Swelling in ankles.  . Ace Inhibitors Cough  . Benadryl [Diphenhydramine Hcl] Other (See Comments)    topical  . Spironolactone Rash      Social History   Social History  . Marital status: Divorced    Spouse name: N/A  . Number of children: N/A  . Years of education: N/A   Occupational History  . Not on file.   Social History Main Topics  . Smoking status: Never Smoker  . Smokeless tobacco: Never Used  . Alcohol use 0.6 oz/week    1 Glasses of wine per week     Comment: occ  . Drug use: No  . Sexual activity: No   Other Topics Concern  . 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 COPD Mother   . Hypertension Mother   . Osteoporosis Mother   . Diabetes Father   . Hypertension Father   . Liver cancer Father   . Heart attack Father   . Hypertension Sister   . Hypertension Brother   . Stroke Maternal Grandmother     Vitals:   03/06/17 0921  BP: 94/62  Pulse: 65  SpO2: 99%  Weight: 224 lb 4 oz (101.7 kg)   Wt Readings from Last 3 Encounters:  03/06/17 224 lb 4 oz (101.7 kg)  01/24/17 226 lb (102.5 kg)  01/17/17 227 lb (103 kg)   PHYSICAL EXAM: General:  Elderly appearing female in NAD. In [place].  HEENT: Normal Neck: supple. JVP 9-10 cm. Carotids 2+ bilat; no bruits. No lymphadenopathy or thyromegaly appreciated. Cor: PMI nondisplaced. Irregular. Rate controlled. RV heave.  No S3/S4. Lungs: Diminished. Otherwise clear.  Abdomen: soft, nontender, nondistended. No hepatosplenomegaly. No bruits or masses. Good bowel sounds. Extremities: no cyanosis, clubbing, rash, Trace ankle edema.  Neuro: alert & oriented x 3, cranial nerves grossly intact. moves all 4 extremities w/o difficulty. Affect pleasant.   ASSESSMENT & PLAN: 1. Chronic diastolic CHF with severe pulmonary hypertension and prominent RV  failure: NYHA class III, some volume overload on exam today.  - Increase torsemide to 40 mg bid, BMET/BNP in today days. - Continue diamox 250 daily   - Continue potassium 20 bid.   2. Chronic hypercarbic/hypoxic respiratory failure with OHS/OSA: Sees Dr. Lake Bells.  Using oxygen during the day and Bipap nightly.    3. Atrial fibrillation: Chronic, rate controlled she is on Xarelto + atenolol.  - Continue atenolol 50 mg daily.  - No bleeding on Xarelto. No change. 4. 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 PVR 5.7  WU by [place] in 3/18. Suspect group 2 (elevated LA pressure) and group 3 (OHS/OSA) PH. However, cannot rule out group 1 component. She had V/Q scan that was not suggestive of chronic PE and high resolution chest CT that was not suggestive of ILD. ANA, RF, anti-SCL70 all negative.  - She feels like Revatio has helped.  Increase to 40 mg tid.  - Continue 02 at 2 L and CPAP/BiPAP at night for OHS/OSA.  - Unable to complete 6 minute walk due to back pain.   Followup in 6 wks.   Loralie Champagne 03/06/2017

## 2017-03-06 NOTE — Patient Instructions (Signed)
Increase Torsemide to 40 mg (2 tabs) Twice daily   Increase Sildenafil to 40 mg (2 tabs) Three times a day   Labs in 10 days  Your physician recommends that you schedule a follow-up appointment in: 6 weeks

## 2017-03-06 NOTE — Progress Notes (Signed)
Attempted 6 min walk test, pt made it 100 ft and had to stop due to back pain.

## 2017-03-06 NOTE — Telephone Encounter (Signed)
Spoke with pt, answered questions regarding cpap machine.  Nothing further needed.

## 2017-03-13 ENCOUNTER — Telehealth: Payer: Self-pay | Admitting: Pulmonary Disease

## 2017-03-13 ENCOUNTER — Encounter (HOSPITAL_COMMUNITY): Payer: Self-pay | Admitting: Pharmacist

## 2017-03-13 NOTE — Telephone Encounter (Signed)
I have spoken with pt, who had additional questions in regards to cpap order that was placed to [facility]. Pt states [facility] has not processed order for cpap, due to pt currently having bipap machine. I have explained to pt that per 03/01/17 phone note BQ stated based on pt's blood gas pt can tolerate autotitration cpap, and no longer needs a bipap. Pt states her cardiologist has ordered a two week in home titration for her bipap that is scheduled for tomorrow.  I have spoken with Barbaraann Rondo with [facility], who states due to pt being prescribed new bipap machine on 11-20-16, insurance will not pay for a new unit. Barbaraann Rondo states based off of the titration study that pt is having tomorrow BQ can choose a single pressure and pt's bipap can be set to cpap mode. Barbaraann Rondo states bipap can not be set to auto range, but can be set to single pressure.  BQ please advise. Thanks.

## 2017-03-14 ENCOUNTER — Telehealth: Payer: Self-pay | Admitting: Physician Assistant

## 2017-03-14 NOTE — Telephone Encounter (Signed)
Patient is calling to follow up on request. [phone].

## 2017-03-14 NOTE — Telephone Encounter (Signed)
There is no such thing as a two week in home BIPAP titration that I am aware of.   She needs a sleep doctor, please refer. They may be knowledgeable about an indication for BIPAP for her, but with her normal ABG recently I don't think she qualifies.  In the meantime my recommendations stands: autotitrating CPAP machine.

## 2017-03-14 NOTE — Telephone Encounter (Signed)
lmtcb x1 for pt.

## 2017-03-14 NOTE — Telephone Encounter (Signed)
[facility] called to inform [name] that the office will need to provide documentation of prior authorization to patient's insurance company from most recent torsemide dose because they will not fill dose change without such. Spoke with patient, she said she has enough to last her the rest of the week and said she spoke with someone named Doroteo Bradford (pharmacist perhaps?) about this issue earlier. I will forward to [name], [place] pharmacist, nurse to ensure prior auth completed. Dayna Dunn PA-C

## 2017-03-14 NOTE — Telephone Encounter (Signed)
[name], can you deal with this please?

## 2017-03-14 NOTE — Telephone Encounter (Signed)
In order to be qualified for a BIPAP machine patient will need a CPAP/BiPAP Titration study at the sleep center.  Called [facility] and LM for Jason x 1   Appt will be placed for a sleep MD once we speak with the patient to make aware.

## 2017-03-15 ENCOUNTER — Telehealth (HOSPITAL_COMMUNITY): Payer: Self-pay | Admitting: Pharmacist

## 2017-03-15 DIAGNOSIS — R269 Unspecified abnormalities of gait and mobility: Secondary | ICD-10-CM | POA: Diagnosis not present

## 2017-03-15 DIAGNOSIS — I5040 Unspecified combined systolic (congestive) and diastolic (congestive) heart failure: Secondary | ICD-10-CM | POA: Diagnosis not present

## 2017-03-15 DIAGNOSIS — J9610 Chronic respiratory failure, unspecified whether with hypoxia or hypercapnia: Secondary | ICD-10-CM | POA: Diagnosis not present

## 2017-03-15 NOTE — Telephone Encounter (Signed)
Sildenafil 40 mg TID appeal denied by [employer] since it is not an FDA approved dosage of sildenafil. Will send to [name] to see if he would like to switch her to Adcirca.   [name]. [name], PharmD, BCPS, CPP Clinical Pharmacist Pager: [phone] Phone: [phone] 03/15/2017 11:57 AM

## 2017-03-15 NOTE — Telephone Encounter (Signed)
Torsemide does not require a PA. [employer] stating that torsemide is on their formulary without restrictions. [facility] for [facility] and they stated that she got a 90 day supply last June for #3 tabs daily but it is still a refill too soon for the #4 tabs daily. Called Ms. Soulliere and she had forgotten that she had picked up #360 tablets last month and was able to find the bottle.   Ruta Hinds. Velva Harman, PharmD, BCPS, CPP Clinical Pharmacist Pager: [phone] Phone: [phone] 03/15/2017 4:51 PM

## 2017-03-15 NOTE — Telephone Encounter (Signed)
I called and spoke with pt and she was confused about the cpap and bipap issues.  I advised her that I would call and speak with Corene Cornea from [facility] to see what is all needed.     I called ans poke with Corene Cornea and he stated that the pt was set up on BIPAP when she was dc from the hospital and sent to skilled facility ([facility]).  She was set up on bipap once there without the sleep study being done.  Pt had done the in lab sleep study but did not do well on the test and could not tolerate to complete the test.  Corene Cornea stated that her insurance would not cover the bipap--so this was changed over to self pay and they have been charging the pt on her credit card. Pt did have the home sleep test in June and she is aware that it [facility] cannot file for her insurance to cover the BIPAP from that time unitl when she turns the BIPAP in, then she will need to contact [facility] to file this with her insurance.   She stated that she will call [facility] to set up the time to go in and get set up with the cpap.

## 2017-03-15 NOTE — Telephone Encounter (Signed)
Patient returned call, CB is [phone].

## 2017-03-15 NOTE — Telephone Encounter (Signed)
Switch to Adcirca 20 mg daily.

## 2017-03-16 MED ORDER — TADALAFIL (PAH) 20 MG PO TABS: 20.0000 mg | ORAL_TABLET | Freq: Every day | ORAL | 11 refills | Status: DC

## 2017-03-16 NOTE — Addendum Note (Signed)
Addended by: Adora Fridge on: 03/16/2017 10:09 AM   Modules accepted: Orders

## 2017-03-16 NOTE — Telephone Encounter (Signed)
[facility] Also initiated PA through [facility]. Ms. Swetz aware and appreciative of the assistance.   Ruta Hinds. Velva Harman, PharmD, BCPS, CPP Clinical Pharmacist Pager: [phone] Phone: [phone] 03/16/2017 10:03 AM

## 2017-03-17 ENCOUNTER — Telehealth (HOSPITAL_COMMUNITY): Payer: Self-pay | Admitting: Pharmacist

## 2017-03-17 NOTE — Telephone Encounter (Signed)
Adcirca PA approved by [employer] through 09/12/17.   Ruta Hinds. Velva Harman, PharmD, BCPS, CPP Clinical Pharmacist Pager: [phone] Phone: [phone] 03/17/2017 10:52 AM

## 2017-03-20 ENCOUNTER — Ambulatory Visit (HOSPITAL_COMMUNITY)
Admission: RE | Admit: 2017-03-20 | Discharge: 2017-03-20 | Disposition: A | Discharge status: Home / Self Care | Payer: PPO | Source: Ambulatory Visit | Attending: Cardiology | Admitting: Cardiology

## 2017-03-20 ENCOUNTER — Telehealth (HOSPITAL_COMMUNITY): Payer: Self-pay | Admitting: *Deleted

## 2017-03-20 DIAGNOSIS — I5032 Chronic diastolic (congestive) heart failure: Secondary | ICD-10-CM

## 2017-03-20 DIAGNOSIS — I482 Chronic atrial fibrillation, unspecified: Secondary | ICD-10-CM

## 2017-03-20 LAB — BASIC METABOLIC PANEL WITH GFR
Anion gap: 11 (ref 5–15)
BUN: 45 mg/dL — ABNORMAL HIGH (ref 6–20)
CO2: 23 mmol/L (ref 22–32)
Calcium: 9.7 mg/dL (ref 8.9–10.3)
Chloride: 106 mmol/L (ref 101–111)
Creatinine, Ser: 1.57 mg/dL — ABNORMAL HIGH (ref 0.44–1.00)
GFR calc Af Amer: 38 mL/min — ABNORMAL LOW
GFR calc non Af Amer: 33 mL/min — ABNORMAL LOW
Glucose, Bld: 89 mg/dL (ref 65–99)
Potassium: 3.9 mmol/L (ref 3.5–5.1)
Sodium: 140 mmol/L (ref 135–145)

## 2017-03-20 LAB — BRAIN NATRIURETIC PEPTIDE: B Natriuretic Peptide: 326.6 pg/mL — ABNORMAL HIGH (ref 0.0–100.0)

## 2017-03-20 MED ORDER — TORSEMIDE 20 MG PO TABS: ORAL_TABLET | ORAL | 6 refills | Status: DC

## 2017-03-20 NOTE — Telephone Encounter (Signed)
Basic metabolic panel  Order: [id number]  Status:  Final result Visible to patient:  Yes (MyChart) Dx:  Chronic diastolic (congestive) heart ...  Notes recorded by Darron Doom, RN on 03/20/2017 at 2:50 PM EDT Called and spoke with patient she is agreeable with plan. Torsemide decreased, bmet ordered, and lab appointment scheduled. ------  Notes recorded by Larey Dresser, MD on 03/20/2017 at 2:43 PM EDT Hold one dose of pm torsemide, then decrease torsemide to 40 qam/20 qpm. BMET in 1 week.

## 2017-03-22 DIAGNOSIS — Z961 Presence of intraocular lens: Secondary | ICD-10-CM | POA: Diagnosis not present

## 2017-03-22 DIAGNOSIS — H26491 Other secondary cataract, right eye: Secondary | ICD-10-CM | POA: Diagnosis not present

## 2017-03-22 DIAGNOSIS — H524 Presbyopia: Secondary | ICD-10-CM | POA: Diagnosis not present

## 2017-03-23 DIAGNOSIS — I5040 Unspecified combined systolic (congestive) and diastolic (congestive) heart failure: Secondary | ICD-10-CM | POA: Diagnosis not present

## 2017-03-23 DIAGNOSIS — R269 Unspecified abnormalities of gait and mobility: Secondary | ICD-10-CM | POA: Diagnosis not present

## 2017-03-23 DIAGNOSIS — G4733 Obstructive sleep apnea (adult) (pediatric): Secondary | ICD-10-CM | POA: Diagnosis not present

## 2017-03-23 DIAGNOSIS — J9610 Chronic respiratory failure, unspecified whether with hypoxia or hypercapnia: Secondary | ICD-10-CM | POA: Diagnosis not present

## 2017-03-25 ENCOUNTER — Other Ambulatory Visit: Payer: Self-pay | Admitting: Cardiovascular Disease

## 2017-03-27 ENCOUNTER — Other Ambulatory Visit (HOSPITAL_COMMUNITY): Payer: PPO

## 2017-03-27 ENCOUNTER — Encounter (HOSPITAL_COMMUNITY)
Admission: RE | Admit: 2017-03-27 | Discharge: 2017-03-27 | Disposition: A | Discharge status: Home / Self Care | Payer: PPO | Source: Ambulatory Visit | Attending: Pulmonary Disease | Admitting: Pulmonary Disease

## 2017-03-27 VITALS — BP 104/60 | HR 68 | Ht 61.5 in | Wt 225.5 lb

## 2017-03-27 DIAGNOSIS — J9611 Chronic respiratory failure with hypoxia: Secondary | ICD-10-CM | POA: Diagnosis not present

## 2017-03-27 NOTE — Progress Notes (Signed)
[name] 66 y.o. female Pulmonary Rehab Orientation Note Patient arrived today in Cardiac and Pulmonary Rehab for orientation to Pulmonary Rehab. She was transported from [employer] via wheel chair. She does carry portable oxygen. Per pt, she uses oxygen constantly. Color good, skin warm and dry. Patient is oriented to time and place. Patient's medical history, psychosocial health, and medications reviewed. Psychosocial assessment reveals pt lives alone. Pt is currently retired after a stroke in approximately 2013. Pt hobbies include reading. Pt reports her stress level is low related to her health.  She cannot do the things she could do in the past and it saddens her.  Pt does not exhibit  signs of depression.  PHQ2/9 score 0/0. Pt shows good  coping skills with positive outlook . Will continue to monitor and evaluate for psychosocial issues. Physical assessment reveals heart rate is normal, breath sounds clear to auscultation, no wheezes, rales, or rhonchi. Grip strength equal, strong. Distal pulses 2+ bilateral posterior tibial pulses present with 1+ bilateral ankle and leg edema which patient states is her baseline. Patient reports she does take medications as prescribed. Patient states she follows a Low Sodium diet. The patient reports no specific efforts to gain or lose weight.. Patient's weight will be monitored closely. Demonstration and practice of PLB using pulse oximeter. Patient able to return demonstration satisfactorily. Safety and hand hygiene in the exercise area reviewed with patient. Patient voices understanding of the information reviewed. Department expectations discussed with patient and achievable goals were set. The patient shows enthusiasm about attending the program and we look forward to working with this nice lady. The patient is scheduled for a 6 min walk test on Thursday, March 30, 2017 @ 3:45 pm and to begin exercise on Thursday, April 06, 2017 in the 10:30 am class.    [id number]

## 2017-03-28 ENCOUNTER — Ambulatory Visit (HOSPITAL_COMMUNITY)
Admission: RE | Admit: 2017-03-28 | Discharge: 2017-03-28 | Disposition: A | Discharge status: Home / Self Care | Payer: PPO | Source: Ambulatory Visit | Attending: Cardiology | Admitting: Cardiology

## 2017-03-28 DIAGNOSIS — I482 Chronic atrial fibrillation, unspecified: Secondary | ICD-10-CM

## 2017-03-28 LAB — BASIC METABOLIC PANEL
ANION GAP: 9 (ref 5–15)
BUN: 32 mg/dL — ABNORMAL HIGH (ref 6–20)
CO2: 25 mmol/L (ref 22–32)
Calcium: 9.7 mg/dL (ref 8.9–10.3)
Chloride: 104 mmol/L (ref 101–111)
Creatinine, Ser: 1.31 mg/dL — ABNORMAL HIGH (ref 0.44–1.00)
GFR, EST AFRICAN AMERICAN: 48 mL/min — AB (ref >60)
GFR, EST NON AFRICAN AMERICAN: 41 mL/min — AB (ref >60)
GLUCOSE: 92 mg/dL (ref 65–99)
Potassium: 3.8 mmol/L (ref 3.5–5.1)
Sodium: 138 mmol/L (ref 135–145)

## 2017-03-29 ENCOUNTER — Encounter: Payer: Self-pay | Admitting: Pulmonary Disease

## 2017-03-29 ENCOUNTER — Ambulatory Visit (INDEPENDENT_AMBULATORY_CARE_PROVIDER_SITE_OTHER): Payer: PPO | Admitting: Pulmonary Disease

## 2017-03-29 VITALS — BP 118/72 | HR 61 | Wt 225.0 lb

## 2017-03-29 DIAGNOSIS — J9611 Chronic respiratory failure with hypoxia: Secondary | ICD-10-CM | POA: Diagnosis not present

## 2017-03-29 DIAGNOSIS — I272 Pulmonary hypertension, unspecified: Secondary | ICD-10-CM | POA: Diagnosis not present

## 2017-03-29 DIAGNOSIS — I5032 Chronic diastolic (congestive) heart failure: Secondary | ICD-10-CM

## 2017-03-29 DIAGNOSIS — G4733 Obstructive sleep apnea (adult) (pediatric): Secondary | ICD-10-CM | POA: Diagnosis not present

## 2017-03-29 NOTE — Patient Instructions (Addendum)
For your obstructive sleep apnea: Keep using CPAP every night We will get a download of compliance  For your diastolic heart failure: Weight yourself every day Limit your fluid and salt intake Keep your appointments with cardiology Keep taking your diuretics  For your chronic respiratory failure with hypoxemia: Keep using 2 L of oxygen continuously.  For your pulmonary hypertension secondary to obstructive sleep apnea and diastolic heart failure: Lose weight Use CPAP  For your obesity and deconditioning: Exercise regularly The following behaviors have been associated with weight loss: Weigh yourself daily Write down everything you eat Drink a glass of water prior to eating a meal  We will see you back in 6 months or sooner if needed
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
## 2017-03-30 ENCOUNTER — Ambulatory Visit (HOSPITAL_COMMUNITY): Payer: PPO

## 2017-04-01 DIAGNOSIS — R269 Unspecified abnormalities of gait and mobility: Secondary | ICD-10-CM | POA: Diagnosis not present

## 2017-04-01 DIAGNOSIS — J9610 Chronic respiratory failure, unspecified whether with hypoxia or hypercapnia: Secondary | ICD-10-CM | POA: Diagnosis not present

## 2017-04-01 DIAGNOSIS — I5040 Unspecified combined systolic (congestive) and diastolic (congestive) heart failure: Secondary | ICD-10-CM | POA: Diagnosis not present

## 2017-04-03 ENCOUNTER — Other Ambulatory Visit: Payer: Self-pay

## 2017-04-03 MED ORDER — LEVOTHYROXINE SODIUM 75 MCG PO TABS: 75.0000 ug | ORAL_TABLET | Freq: Every day | ORAL | 2 refills | Status: DC

## 2017-04-04 ENCOUNTER — Encounter (HOSPITAL_COMMUNITY)
Admission: RE | Admit: 2017-04-04 | Discharge: 2017-04-04 | Disposition: A | Discharge status: Home / Self Care | Payer: PPO | Source: Ambulatory Visit | Attending: Pulmonary Disease | Admitting: Pulmonary Disease

## 2017-04-04 ENCOUNTER — Telehealth (HOSPITAL_COMMUNITY): Payer: Self-pay | Admitting: Pharmacist

## 2017-04-04 DIAGNOSIS — I482 Chronic atrial fibrillation: Secondary | ICD-10-CM | POA: Diagnosis not present

## 2017-04-04 DIAGNOSIS — J9611 Chronic respiratory failure with hypoxia: Secondary | ICD-10-CM | POA: Insufficient documentation

## 2017-04-04 DIAGNOSIS — Z86010 Personal history of colonic polyps: Secondary | ICD-10-CM | POA: Insufficient documentation

## 2017-04-04 DIAGNOSIS — E662 Morbid (severe) obesity with alveolar hypoventilation: Secondary | ICD-10-CM | POA: Insufficient documentation

## 2017-04-04 DIAGNOSIS — M1990 Unspecified osteoarthritis, unspecified site: Secondary | ICD-10-CM | POA: Diagnosis not present

## 2017-04-04 DIAGNOSIS — Z96612 Presence of left artificial shoulder joint: Secondary | ICD-10-CM | POA: Diagnosis not present

## 2017-04-04 DIAGNOSIS — E538 Deficiency of other specified B group vitamins: Secondary | ICD-10-CM | POA: Diagnosis not present

## 2017-04-04 DIAGNOSIS — M810 Age-related osteoporosis without current pathological fracture: Secondary | ICD-10-CM | POA: Insufficient documentation

## 2017-04-04 DIAGNOSIS — I119 Hypertensive heart disease without heart failure: Secondary | ICD-10-CM | POA: Insufficient documentation

## 2017-04-04 DIAGNOSIS — E039 Hypothyroidism, unspecified: Secondary | ICD-10-CM | POA: Diagnosis not present

## 2017-04-04 DIAGNOSIS — Z8673 Personal history of transient ischemic attack (TIA), and cerebral infarction without residual deficits: Secondary | ICD-10-CM | POA: Diagnosis not present

## 2017-04-04 DIAGNOSIS — Z79899 Other long term (current) drug therapy: Secondary | ICD-10-CM | POA: Diagnosis not present

## 2017-04-04 DIAGNOSIS — I272 Pulmonary hypertension, unspecified: Secondary | ICD-10-CM | POA: Insufficient documentation

## 2017-04-04 DIAGNOSIS — Z96611 Presence of right artificial shoulder joint: Secondary | ICD-10-CM | POA: Insufficient documentation

## 2017-04-04 DIAGNOSIS — Z96653 Presence of artificial knee joint, bilateral: Secondary | ICD-10-CM | POA: Insufficient documentation

## 2017-04-04 DIAGNOSIS — E785 Hyperlipidemia, unspecified: Secondary | ICD-10-CM | POA: Diagnosis not present

## 2017-04-04 DIAGNOSIS — Z7901 Long term (current) use of anticoagulants: Secondary | ICD-10-CM | POA: Diagnosis not present

## 2017-04-04 NOTE — Telephone Encounter (Signed)
Ms. Heiden called stating her Xarelto is now $127/mo since she is now in the donut hole and she will not be able to afford this for long. I have advised her that for her to qualify for the [employer] patient assistance program, she will need to have spent 3% of her annual income on her Rx's for the year. She verbalized understanding and will attempt to get a pharmacy print out for me.   Ruta Hinds. Velva Harman, PharmD, BCPS, CPP Clinical Pharmacist Pager: [phone] Phone: [phone] 04/04/2017 2:29 PM

## 2017-04-05 NOTE — Telephone Encounter (Signed)
Ms. Griffeth called stating that she received a copy of her pharmacy print out and has spent $754.15 this year on her Rx's and her SSI for the year is [phone] so she should be able to qualify for [employer] patient assistance. I will mail the enrollment form to her and she will bring back the paperwork next Tuesday at her appointment with [name].   Ruta Hinds. Velva Harman, PharmD, BCPS, CPP Clinical Pharmacist Pager: [phone] Phone: [phone] 04/05/2017 4:12 PM

## 2017-04-06 ENCOUNTER — Encounter (HOSPITAL_COMMUNITY): Payer: PPO
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
## 2017-04-11 ENCOUNTER — Encounter (HOSPITAL_COMMUNITY): Payer: Self-pay | Admitting: Cardiology

## 2017-04-11 ENCOUNTER — Ambulatory Visit (HOSPITAL_COMMUNITY)
Admission: RE | Admit: 2017-04-11 | Discharge: 2017-04-11 | Disposition: A | Discharge status: Home / Self Care | Payer: PPO | Source: Ambulatory Visit | Attending: Cardiology | Admitting: Cardiology

## 2017-04-11 ENCOUNTER — Encounter (HOSPITAL_COMMUNITY)
Admission: RE | Admit: 2017-04-11 | Discharge: 2017-04-11 | Disposition: A | Discharge status: Home / Self Care | Payer: PPO | Source: Ambulatory Visit | Attending: Pulmonary Disease | Admitting: Pulmonary Disease

## 2017-04-11 VITALS — BP 111/53 | HR 64 | Wt 222.2 lb

## 2017-04-11 VITALS — Wt 221.3 lb

## 2017-04-11 DIAGNOSIS — I110 Hypertensive heart disease with heart failure: Secondary | ICD-10-CM | POA: Diagnosis not present

## 2017-04-11 DIAGNOSIS — I482 Chronic atrial fibrillation, unspecified: Secondary | ICD-10-CM

## 2017-04-11 DIAGNOSIS — G4733 Obstructive sleep apnea (adult) (pediatric): Secondary | ICD-10-CM | POA: Diagnosis not present

## 2017-04-11 DIAGNOSIS — Z7901 Long term (current) use of anticoagulants: Secondary | ICD-10-CM | POA: Insufficient documentation

## 2017-04-11 DIAGNOSIS — J9611 Chronic respiratory failure with hypoxia: Secondary | ICD-10-CM | POA: Insufficient documentation

## 2017-04-11 DIAGNOSIS — I272 Pulmonary hypertension, unspecified: Secondary | ICD-10-CM

## 2017-04-11 DIAGNOSIS — E039 Hypothyroidism, unspecified: Secondary | ICD-10-CM | POA: Diagnosis not present

## 2017-04-11 DIAGNOSIS — I5032 Chronic diastolic (congestive) heart failure: Secondary | ICD-10-CM | POA: Insufficient documentation

## 2017-04-11 DIAGNOSIS — I2721 Secondary pulmonary arterial hypertension: Secondary | ICD-10-CM | POA: Diagnosis not present

## 2017-04-11 DIAGNOSIS — Z8673 Personal history of transient ischemic attack (TIA), and cerebral infarction without residual deficits: Secondary | ICD-10-CM | POA: Diagnosis not present

## 2017-04-11 LAB — BASIC METABOLIC PANEL
Anion gap: 11 (ref 5–15)
BUN: 35 mg/dL — AB (ref 6–20)
CALCIUM: 9.8 mg/dL (ref 8.9–10.3)
CO2: 23 mmol/L (ref 22–32)
CREATININE: 1.2 mg/dL — AB (ref 0.44–1.00)
Chloride: 107 mmol/L (ref 101–111)
GFR calc Af Amer: 53 mL/min — ABNORMAL LOW (ref >60)
GFR calc non Af Amer: 46 mL/min — ABNORMAL LOW (ref >60)
GLUCOSE: 97 mg/dL (ref 65–99)
Potassium: 3.7 mmol/L (ref 3.5–5.1)
Sodium: 141 mmol/L (ref 135–145)

## 2017-04-11 LAB — BRAIN NATRIURETIC PEPTIDE: B Natriuretic Peptide: 348.6 pg/mL — ABNORMAL HIGH (ref 0.0–100.0)

## 2017-04-11 MED ORDER — TADALAFIL (PAH) 20 MG PO TABS: 40.0000 mg | ORAL_TABLET | Freq: Every day | ORAL | 11 refills | Status: DC

## 2017-04-11 MED ORDER — RIVAROXABAN 20 MG PO TABS: 20.0000 mg | ORAL_TABLET | Freq: Every day | ORAL | 3 refills | Status: DC

## 2017-04-11 NOTE — Progress Notes (Signed)
Daily Session Note  Patient Details  Name: [name] MRN: [id number] Date of Birth: 1951-07-05 Referring Provider:     Pulmonary Rehab Walk Test from 04/04/2017 in [place]  Referring Provider  Dr. Lake Bells      Encounter Date: 04/11/2017  Check In:     Session Check In - 04/11/17 1208      Check-In   Location [facility]   Staff Present Su Hilt, MS, ACSM RCEP, Exercise Physiologist;Lisa Ysidro Evert, RN;Portia Rollene Rotunda, RN, BSN   Supervising physician immediately available to respond to emergencies [facility] immediately available   Physician(s) [name]   Medication changes reported     No   Fall or balance concerns reported    No   Tobacco Cessation No Change   Warm-up and Cool-down Performed as group-led instruction   Resistance Training Performed Yes   VAD Patient? No     Pain Assessment   Currently in Pain? No/denies   Multiple Pain Sites No      Capillary Blood Glucose: Results for orders placed or performed during the hospital encounter of 04/11/17 (from the past 24 hour(s))  Basic metabolic panel     Status: Abnormal   Collection Time: 04/11/17  9:42 AM  Result Value Ref Range   Sodium 141 135 - 145 mmol/L   Potassium 3.7 3.5 - 5.1 mmol/L   Chloride 107 101 - 111 mmol/L   CO2 23 22 - 32 mmol/L   Glucose, Bld 97 65 - 99 mg/dL   BUN 35 (H) 6 - 20 mg/dL   Creatinine, Ser 1.20 (H) 0.44 - 1.00 mg/dL   Calcium 9.8 8.9 - 10.3 mg/dL   GFR calc non Af Amer 46 (L) >60 mL/min   GFR calc Af Amer 53 (L) >60 mL/min   Anion gap 11 5 - 15  B Nat Peptide     Status: Abnormal   Collection Time: 04/11/17  9:42 AM  Result Value Ref Range   B Natriuretic Peptide 348.6 (H) 0.0 - 100.0 pg/mL        Exercise Prescription Changes - 04/11/17 1200      Response to Exercise   Blood Pressure (Admit) 108/58   Blood Pressure (Exercise) 96/59   Blood Pressure (Exit) 110/56   Heart Rate (Admit) 60 bpm   Heart Rate  (Exercise) 72 bpm   Heart Rate (Exit) 82 bpm   Oxygen Saturation (Admit) 99 %   Oxygen Saturation (Exercise) 94 %   Oxygen Saturation (Exit) 99 %   Rating of Perceived Exertion (Exercise) 15   Perceived Dyspnea (Exercise) 2   Duration Continue with 45 min of aerobic exercise without signs/symptoms of physical distress.   Intensity THRR unchanged     Progression   Progression Continue to progress workloads to maintain intensity without signs/symptoms of physical distress.     Resistance Training   Training Prescription Yes   Weight orange   Reps 10-15     Oxygen   Oxygen Continuous   Liters 3     NuStep   Level 1   Minutes 34   METs 1.4     Track   Laps 2.5   Minutes 17      History  Smoking Status  . Never Smoker  Smokeless Tobacco  . Never Used    Goals Met:  Exercise tolerated well No report of cardiac concerns or symptoms Strength training completed today  Goals Unmet:  Not Applicable  Comments: Service time is from 10:30a to 12:05p    Dr. Rush Farmer is Medical Director for Pulmonary Rehab at [facility].

## 2017-04-11 NOTE — Patient Instructions (Signed)
Increase Adcirca to 40 mg (2 tabs) daily  Labs today  Your physician recommends that you schedule a follow-up appointment in: 3 months

## 2017-04-11 NOTE — Progress Notes (Signed)
Advanced Heart Failure Clinic Note   Primary Care: Dr. [name]  HF Cardiology: [name]  Pulm: Dr. Lake Bells  HPI:  [name] is a 65 y.o. female with PMH of pulmonary HTN, morbid obesity, chronic afib (on Xarelto, CVA in 2012, OHS/OSA (Had U PE3 surgery, and chronic respiratory failure with hypoxemia on continuous 02 at 2 lpm via [place]).  Admitted 3/2 ->4/78/29 with A/C diastolic CHF and A/C respiratory failure. Pt initially refused Bipap so venti mask used. Eventually tolerated transition to BiPAP. Overall pt diuresed from 285 lbs down to 229 lbs with 38 L of diuresis.  Echo 11/07/16 EF 55-60%, PASP 43m Hg, mildly dilated RV with moderate to severely decreased RV systolic function. RHC/LHC in 3/18 showed no coronary disease and confirmed severe PH.    She returns today for HF follow up. Weights at home 219 pounds. Denies dizziness and orthostatic symptoms. Eating some high salt foods, but for the most part does ok. Drinking more than 2L a day. Denies chest pain, syncope, presyncope. Wearing oxygen at 2L at all times. Compliant with CPAP. She has started pulmonary rehab, she completed a 6 minute walk at pulmonary rehab last week and walked 410 feet. She has to stop at 200 feet to catch her breath. She has started Adcirca 20 mg last month and is tolerating well, denies dizziness.   PMH 1. Chronic diastolic CHF with severe pulmonary hypertension and prominent RV failure:  Echo (3/18) with EF 55-60%, PASP 96mHg, mildly dilated RV with moderate to severely decreased RV systolic function. - RHC/LHC 11/07/16: No angiographic CAD; PA 90/55, LVEDP 23 (PCWP inaccurate), CI 2.15 Fick/2.32 Thermo, shunt run negative, PVR 5.7 WU.  2. Chronic hypercarbic/hypoxic respiratory failure with OHS/OSA. Sees Dr. McLake BellsUsing nightly BiPAP and oxygen by nasal cannula during the day.  3. Atrial fibrillation: Chronic - Rate controlled on Xarelto + atenolol  4. 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 PVR 5.7 WU on RHC 3/18. Suspect group 2 (elevated LA pressure) and group 3 (OHS/OSA) PH. However, cannot rule out group 1 component. She had V/Q scan that was not suggestive of chronic PE and high resolution chest CT that was not suggestive of ILD. ANA, RF, anti-SCL70 all negative.   5. H/o CVA 6. Hypothyroidism 7. H/o TKR   Current Outpatient Prescriptions  Medication Sig Dispense Refill  . acetaZOLAMIDE (DIAMOX) 250 MG tablet Take 1 tablet (250 mg total) by mouth daily. 30 tablet 6  . atenolol (TENORMIN) 50 MG tablet Take 50 mg by mouth daily. Take an extra 1/2 tab by mouth at bedtime    . colchicine 0.6 MG tablet Take 1 tablet (0.6 mg total) by mouth daily as needed (flareups). 60 tablet 1  . diclofenac sodium (VOLTAREN) 1 % GEL Apply 2 g topically 2 (two) times daily as needed (for arthritis).     . Marland Kitchenevothyroxine (SYNTHROID, LEVOTHROID) 75 MCG tablet Take 1 tablet (75 mcg total) by mouth daily. 90 tablet 2  . OXYGEN Inhale 2 L into the lungs continuous.     . potassium chloride (K-DUR) 10 MEQ tablet Take 20 mEq by mouth 2 (two) times daily.    . rivaroxaban (XARELTO) 20 MG TABS tablet Take 1 tablet (20 mg total) by mouth daily with supper. 90 tablet 3  . tadalafil, PAH, (ADCIRCA) 20 MG tablet Take 1 tablet (20 mg total) by mouth daily. 30 tablet 11  . torsemide (DEMADEX) 20 MG tablet Take 4077m2 Tablets) in  the AM and 15m (1 Tablet) in the PM. 90 tablet 6  . traMADol (ULTRAM) 50 MG tablet TAKE 1 TABLET BY MOUTH 3 TIMES A DAY AS NEEDED FOR PAIN 90 tablet 0   No current facility-administered medications for this encounter.     Allergies  Allergen Reactions  . Ambien [Zolpidem Tartrate] Other (See Comments)    Amnesia and fall   . Losartan Other (See Comments)    Elevated creatinine and swelling  . Prevacid [Lansoprazole] Swelling  . Adhesive [Tape] Other (See Comments)    Adhesive tape and band aids " irritate, " causes blisters and pulls  skin when removing. Okay to use paper tape  . Keflex [Cephalexin] Swelling    Swelling in ankles, feet  . Motrin [Ibuprofen] Other (See Comments)    ANKLE EDEMA   . Prilosec [Omeprazole] Other (See Comments)    Swelling in ankles.  . Ace Inhibitors Cough  . Benadryl [Diphenhydramine Hcl] Other (See Comments)    topical  . Spironolactone Rash      Social History   Social History  . Marital status: Divorced    Spouse name: N/A  . Number of children: N/A  . Years of education: N/A   Occupational History  . Not on file.   Social History Main Topics  . Smoking status: Never Smoker  . Smokeless tobacco: Never Used  . Alcohol use 0.6 oz/week    1 Glasses of wine per week     Comment: occ  . Drug use: No  . Sexual activity: No   Other Topics Concern  . 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 COPD Mother   . Hypertension Mother   . Osteoporosis Mother   . Diabetes Father   . Hypertension Father   . Liver cancer Father   . Heart attack Father   . Hypertension Sister   . Hypertension Brother   . Stroke Maternal Grandmother     Vitals:   04/11/17 0857  BP: (!) 111/53  Pulse: 64  SpO2: 99%  Weight: 222 lb 4 oz (100.8 kg)   Wt Readings from Last 3 Encounters:  04/11/17 222 lb 4 oz (100.8 kg)  03/29/17 225 lb (102.1 kg)  03/27/17 225 lb 8.5 oz (102.3 kg)   PHYSICAL EXAM: General:  Elderly female, NAD. In wheelchair HEENT: Normal.  Neck: supple. JVP 6-7 cm Carotids 2+ bilat; no bruits. No lymphadenopathy or thyromegaly appreciated. Cor: PMI nondisplaced.Irregularly irregular. + RV heave. No S3/S4. Lungs: Diminished in all lobes. Wearing 2L O2.  Abdomen: Soft, non tender, non distended.  No hepatosplenomegaly. No bruits or masses. Good bowel sounds. Extremities: no cyanosis, clubbing, rash. No  peripheral edema.  Neuro: alert & oriented x 3, cranial nerves grossly intact. moves all 4 extremities w/o difficulty. Affect pleasant.   ASSESSMENT & PLAN: 1. Chronic diastolic CHF with severe pulmonary hypertension and prominent RV failure: - NYHA III. Volume status stable on exam.  - Continue torsemide 472min the am and 20 mg in the pm.  - Continue diamox 250 mg daily.  - Continue KCl 20 mEq BID. Will check BMET today.   2. Chronic hypercarbic/hypoxic respiratory failure with OHS/OSA: Sees Dr. [name]  - Continue continuous  O2 at 2L.  - compliant with Bipap nightly.  - Continue pulmonary rehab.   3. Atrial fibrillation: Chronic, rate controlled  - Continue atenolol 50 mg daily.  - No melena or hematochezia. Continue Xarelto for anticoagulation.    4. Pulmonary hypertension: Per [name] - Mixed pulmonary venous and pulmonary arterial hypertension. PVR 5.7 WU by [place] in 3/18. Suspect group 2 (elevated LA pressure) and group 3 (OHS/OSA) PH. However, cannot rule out group 1 component. She had V/Q scan that was not suggestive of chronic PE and high resolution chest CT that was not suggestive of ILD. ANA, RF, anti-SCL70 all negative.  - Increase Adcirca to 40 mg daily.  - Continue 02 at 2 L and CPAP/BiPAP at night for OHS/OSA.  - 6 minute walk 2 weeks ago at pulmonary rehab - 410 feet.   5. Back pain  - She inquired about getting a back injection today, ok to hold Xarelto for 48 hours per [name] for injection.   Follow up in 3 months. BMET today.   [name] 04/11/2017  Patient seen with NP, agree with the above note.  She is stable symptomatically.  Weight is down . She does not look volume overloaded.  Continue current torsemide.   Severe pulmonary hypertension, likely predominantly group 2 and group 3 (OSA/OHS).  However, cannot completely rule out a group 1 component.  I will have her take Adcirca to see if there is symptomatic improvement (increase Adcirca to 40 mg  daily today).   Followup in 2 months.   Loralie Champagne 04/11/2017

## 2017-04-13 ENCOUNTER — Encounter (HOSPITAL_COMMUNITY)
Admission: RE | Admit: 2017-04-13 | Discharge: 2017-04-13 | Disposition: A | Discharge status: Home / Self Care | Payer: PPO | Source: Ambulatory Visit | Attending: Pulmonary Disease | Admitting: Pulmonary Disease

## 2017-04-13 VITALS — Wt 222.7 lb

## 2017-04-13 DIAGNOSIS — J9611 Chronic respiratory failure with hypoxia: Secondary | ICD-10-CM | POA: Diagnosis not present

## 2017-04-13 NOTE — Progress Notes (Signed)
Daily Session Note  Patient Details  Name: [name] MRN: [id number] Date of Birth: September 07, 1950 Referring Provider:     Pulmonary Rehab Walk Test from 04/04/2017 in [place]  Referring Provider  Dr. Lake Bells      Encounter Date: 04/13/2017  Check In:     Session Check In - 04/13/17 1049      Check-In   Location [facility]   Staff Present Su Hilt, MS, ACSM RCEP, Exercise Physiologist;Terrian Ridlon Ysidro Evert, RN;Portia Rollene Rotunda, RN, BSN   Supervising physician immediately available to respond to emergencies [facility] immediately available   Physician(s) Dr. Clementeen Graham   Medication changes reported     No   Fall or balance concerns reported    No   Tobacco Cessation No Change   Warm-up and Cool-down Performed as group-led instruction   Resistance Training Performed Yes   VAD Patient? No     Pain Assessment   Currently in Pain? No/denies   Multiple Pain Sites No      Capillary Blood Glucose: No results found for this or any previous visit (from the past 24 hour(s)).      Exercise Prescription Changes - 04/13/17 1300      Response to Exercise   Blood Pressure (Admit) 102/50   Blood Pressure (Exercise) 122/56   Blood Pressure (Exit) 98/50   Heart Rate (Admit) 68 bpm   Heart Rate (Exercise) 103 bpm   Heart Rate (Exit) 56 bpm   Oxygen Saturation (Admit) 100 %   Oxygen Saturation (Exercise) 83 %  Sat dropped on the track 83% O2 increased to 4L, sat up 91%   Oxygen Saturation (Exit) 100 %   Rating of Perceived Exertion (Exercise) 15   Perceived Dyspnea (Exercise) 2   Duration Continue with 45 min of aerobic exercise without signs/symptoms of physical distress.   Intensity THRR unchanged     Progression   Progression Continue to progress workloads to maintain intensity without signs/symptoms of physical distress.     Resistance Training   Training Prescription Yes   Weight orange bands   Reps 10-15   Time 10  Minutes     Oxygen   Oxygen Continuous   Liters 2-4     NuStep   Level 1   Minutes 17     Track   Laps 3   Minutes 17      History  Smoking Status  . Never Smoker  Smokeless Tobacco  . Never Used    Goals Met:  No report of cardiac concerns or symptoms Strength training completed today  Goals Unmet:  Not Applicable  Comments: Service time is from 1030 to 1240    Dr. Rush Farmer is Medical Director for Pulmonary Rehab at [facility].

## 2017-04-14 DIAGNOSIS — I5040 Unspecified combined systolic (congestive) and diastolic (congestive) heart failure: Secondary | ICD-10-CM | POA: Diagnosis not present

## 2017-04-14 DIAGNOSIS — R269 Unspecified abnormalities of gait and mobility: Secondary | ICD-10-CM | POA: Diagnosis not present

## 2017-04-14 DIAGNOSIS — J9610 Chronic respiratory failure, unspecified whether with hypoxia or hypercapnia: Secondary | ICD-10-CM | POA: Diagnosis not present

## 2017-04-15 DIAGNOSIS — I5040 Unspecified combined systolic (congestive) and diastolic (congestive) heart failure: Secondary | ICD-10-CM | POA: Diagnosis not present

## 2017-04-15 DIAGNOSIS — J9610 Chronic respiratory failure, unspecified whether with hypoxia or hypercapnia: Secondary | ICD-10-CM | POA: Diagnosis not present

## 2017-04-15 DIAGNOSIS — R269 Unspecified abnormalities of gait and mobility: Secondary | ICD-10-CM | POA: Diagnosis not present

## 2017-04-18 ENCOUNTER — Encounter (HOSPITAL_COMMUNITY)
Admission: RE | Admit: 2017-04-18 | Discharge: 2017-04-18 | Disposition: A | Discharge status: Home / Self Care | Payer: PPO | Source: Ambulatory Visit | Attending: Pulmonary Disease | Admitting: Pulmonary Disease

## 2017-04-18 VITALS — Wt 221.6 lb

## 2017-04-18 DIAGNOSIS — J9611 Chronic respiratory failure with hypoxia: Secondary | ICD-10-CM

## 2017-04-18 NOTE — Progress Notes (Signed)
Daily Session Note  Patient Details  Name: [name] MRN: [id number] Date of Birth: 24-Jan-1951 Referring Provider:     Pulmonary Rehab Walk Test from 04/04/2017 in [place]  Referring Provider  Dr. Lake Bells      Encounter Date: 04/18/2017  Check In:     Session Check In - 04/18/17 1011      Check-In   Location [facility]   Staff Present Rodney Langton, RN;Molly diVincenzo, MS, ACSM RCEP, Exercise Physiologist   Supervising physician immediately available to respond to emergencies [facility] immediately available   Physician(s) Dr. Clementeen Graham   Medication changes reported     No   Fall or balance concerns reported    No   Tobacco Cessation No Change   Warm-up and Cool-down Performed as group-led instruction   Resistance Training Performed Yes   VAD Patient? No     Pain Assessment   Currently in Pain? No/denies   Multiple Pain Sites No      Capillary Blood Glucose: No results found for this or any previous visit (from the past 24 hour(s)).      Exercise Prescription Changes - 04/18/17 1200      Response to Exercise   Blood Pressure (Admit) 118/64   Blood Pressure (Exercise) 96/50   Blood Pressure (Exit) 100/60   Heart Rate (Admit) 60 bpm   Heart Rate (Exercise) 73 bpm   Heart Rate (Exit) 52 bpm   Oxygen Saturation (Admit) 95 %   Oxygen Saturation (Exercise) 96 %   Oxygen Saturation (Exit) 98 %   Rating of Perceived Exertion (Exercise) 15   Perceived Dyspnea (Exercise) 1.5   Duration Continue with 45 min of aerobic exercise without signs/symptoms of physical distress.   Intensity THRR unchanged     Resistance Training   Training Prescription Yes   Weight orange bands   Reps 10-15   Time 10 Minutes     Oxygen   Oxygen Continuous   Liters 2.4     NuStep   Level 2   Minutes 34   METs 1.4     Track   Laps 2   Minutes 17      History  Smoking Status  . Never Smoker  Smokeless Tobacco  .  Never Used    Goals Met:  Exercise tolerated well No report of cardiac concerns or symptoms Strength training completed today  Goals Unmet:  Not Applicable  Comments: Service time is from 1030 to 1210    Dr. Rush Farmer is Medical Director for Pulmonary Rehab at [facility]

## 2017-04-20 ENCOUNTER — Encounter (HOSPITAL_COMMUNITY)
Admission: RE | Admit: 2017-04-20 | Discharge: 2017-04-20 | Disposition: A | Discharge status: Home / Self Care | Payer: PPO | Source: Ambulatory Visit | Attending: Pulmonary Disease | Admitting: Pulmonary Disease

## 2017-04-20 VITALS — Wt 222.9 lb

## 2017-04-20 DIAGNOSIS — J9611 Chronic respiratory failure with hypoxia: Secondary | ICD-10-CM | POA: Diagnosis not present

## 2017-04-20 NOTE — Progress Notes (Signed)
Daily Session Note  Patient Details  Name: [name] MRN: [id number] Date of Birth: 1951/03/19 Referring Provider:     Pulmonary Rehab Walk Test from 04/04/2017 in [place]  Referring Provider  Dr. Lake Bells      Encounter Date: 04/20/2017  Check In:     Session Check In - 04/20/17 1007      Check-In   Location [facility]   Staff Present Rodney Langton, RN;Molly diVincenzo, MS, ACSM RCEP, Exercise Physiologist;Portia Rollene Rotunda, RN, BSN   Supervising physician immediately available to respond to emergencies [facility] immediately available   Physician(s) Dr. Wendee Beavers   Medication changes reported     No   Fall or balance concerns reported    No   Tobacco Cessation No Change   Warm-up and Cool-down Performed as group-led instruction   Resistance Training Performed Yes   VAD Patient? No     Pain Assessment   Currently in Pain? No/denies   Multiple Pain Sites No      Capillary Blood Glucose: No results found for this or any previous visit (from the past 24 hour(s)).      Exercise Prescription Changes - 04/20/17 1200      Response to Exercise   Blood Pressure (Admit) 102/60   Blood Pressure (Exercise) 162/62   Blood Pressure (Exit) 101/64   Heart Rate (Admit) 62 bpm   Heart Rate (Exercise) 97 bpm   Heart Rate (Exit) 76 bpm   Oxygen Saturation (Admit) 99 %   Oxygen Saturation (Exercise) 92 %   Oxygen Saturation (Exit) 100 %   Rating of Perceived Exertion (Exercise) 15   Perceived Dyspnea (Exercise) 2   Duration Continue with 30 min of aerobic exercise without signs/symptoms of physical distress.   Intensity THRR unchanged     Progression   Progression Continue to progress workloads to maintain intensity without signs/symptoms of physical distress.     Resistance Training   Training Prescription Yes   Weight orange bands   Reps 10-15   Time 10 Minutes     Oxygen   Oxygen Continuous   Liters 2-4     NuStep    Level 2   Minutes 34   METs 1.6     Track   Laps 3   Minutes 17      History  Smoking Status  . Never Smoker  Smokeless Tobacco  . Never Used    Goals Met:  No report of cardiac concerns or symptoms Strength training completed today  Goals Unmet:  Not Applicable  Comments: Service time is from 1030 to 1210    Dr. Rush Farmer is Medical Director for Pulmonary Rehab at [facility].

## 2017-04-23 DIAGNOSIS — I5040 Unspecified combined systolic (congestive) and diastolic (congestive) heart failure: Secondary | ICD-10-CM | POA: Diagnosis not present

## 2017-04-23 DIAGNOSIS — R269 Unspecified abnormalities of gait and mobility: Secondary | ICD-10-CM | POA: Diagnosis not present

## 2017-04-23 DIAGNOSIS — J9610 Chronic respiratory failure, unspecified whether with hypoxia or hypercapnia: Secondary | ICD-10-CM | POA: Diagnosis not present

## 2017-04-23 DIAGNOSIS — G4733 Obstructive sleep apnea (adult) (pediatric): Secondary | ICD-10-CM | POA: Diagnosis not present

## 2017-04-25 ENCOUNTER — Encounter: Payer: Self-pay | Admitting: Internal Medicine

## 2017-04-25 ENCOUNTER — Encounter (HOSPITAL_COMMUNITY)
Admission: RE | Admit: 2017-04-25 | Discharge: 2017-04-25 | Disposition: A | Discharge status: Home / Self Care | Payer: PPO | Source: Ambulatory Visit | Attending: Pulmonary Disease | Admitting: Pulmonary Disease

## 2017-04-25 VITALS — Wt 225.5 lb

## 2017-04-25 DIAGNOSIS — J9611 Chronic respiratory failure with hypoxia: Secondary | ICD-10-CM

## 2017-04-25 NOTE — Progress Notes (Signed)
Daily Session Note  Patient Details  Name: [name] MRN: [id number] Date of Birth: [birth year] Referring Provider:     Pulmonary Rehab Walk Test from 04/04/2017 in [place]  Referring Provider  Dr. Lake Bells      Encounter Date: 04/25/2017  Check In:     Session Check In - 04/25/17 1210      Check-In   [facility]   Staff Present Su Hilt, MS, ACSM RCEP, Exercise Physiologist;Lisa Ysidro Evert, RN;Other;Malik Ruffino Rollene Rotunda, RN, BSN   Supervising physician immediately available to respond to emergencies [facility] immediately available   Physician(s) Dr. Wendee Beavers   Medication changes reported     No   Fall or balance concerns reported    No   Tobacco Cessation No Change   Warm-up and Cool-down Performed as group-led instruction   Resistance Training Performed Yes   VAD Patient? No     Pain Assessment   Currently in Pain? No/denies   Multiple Pain Sites No      Capillary Blood Glucose: No results found for this or any previous visit (from the past 24 hour(s)).      Exercise Prescription Changes - 04/25/17 1306      Response to Exercise   Blood Pressure (Admit) 114/66   Blood Pressure (Exercise) 108/60   Blood Pressure (Exit) 106/54   Heart Rate (Admit) 67 bpm   Heart Rate (Exercise) 105 bpm   Heart Rate (Exit) 55 bpm   Oxygen Saturation (Admit) 97 %   Oxygen Saturation (Exercise) 92 %   Oxygen Saturation (Exit) 100 %   Rating of Perceived Exertion (Exercise) 13   Perceived Dyspnea (Exercise) 1   Duration Continue with 30 min of aerobic exercise without signs/symptoms of physical distress.   Intensity THRR unchanged     Progression   Progression Continue to progress workloads to maintain intensity without signs/symptoms of physical distress.     Resistance Training   Training Prescription Yes   Weight orange bands   Reps 10-15   Time 10 Minutes     Oxygen   Oxygen Continuous   Liters 2-4     NuStep   Level 2   Minutes 34   METs 1.6     Track   Laps 3   Minutes 17      History  Smoking Status  . Never Smoker  Smokeless Tobacco  . Never Used    Goals Met:  Exercise tolerated well No report of cardiac concerns or symptoms Strength training completed today  Goals Unmet:  Not Applicable  Comments: Service time is from 1030 to 1200   Dr. Rush Farmer is Medical Director for Pulmonary Rehab at [facility].

## 2017-04-26 ENCOUNTER — Other Ambulatory Visit: Payer: Self-pay | Admitting: Emergency Medicine

## 2017-04-26 MED ORDER — METHOCARBAMOL 500 MG PO TABS: ORAL_TABLET | ORAL | 0 refills | Status: DC

## 2017-04-26 NOTE — Telephone Encounter (Signed)
I am ok with rx  30 robaxin 500 mg  Take 1/2 ( 250) mg as needed for muscle spasm . For now and fu as planned  Please notify patient and send in med

## 2017-04-27 ENCOUNTER — Telehealth: Payer: Self-pay | Admitting: Emergency Medicine

## 2017-04-27 ENCOUNTER — Encounter (HOSPITAL_COMMUNITY)
Admission: RE | Admit: 2017-04-27 | Discharge: 2017-04-27 | Disposition: A | Discharge status: Home / Self Care | Payer: PPO | Source: Ambulatory Visit | Attending: Pulmonary Disease | Admitting: Pulmonary Disease

## 2017-04-27 VITALS — Wt 223.1 lb

## 2017-04-27 DIAGNOSIS — J9611 Chronic respiratory failure with hypoxia: Secondary | ICD-10-CM | POA: Diagnosis not present

## 2017-04-27 NOTE — Progress Notes (Signed)
Daily Session Note  Patient Details  Name: [name] MRN: [id number] Date of Birth: 02-21-1951 Referring Provider:     Pulmonary Rehab Walk Test from 04/04/2017 in [place]  Referring Provider  Dr. Lake Bells      Encounter Date: 04/27/2017  Check In:     Session Check In - 04/27/17 1030      Check-In   [facility]   Staff Present Su Hilt, MS, ACSM RCEP, Exercise Physiologist;Lisa Ysidro Evert, RN;Other;Blaire Hodsdon Rollene Rotunda, RN, BSN   Supervising physician immediately available to respond to emergencies [facility] immediately available   Physician(s) Dr. Clementeen Graham   Medication changes reported     No   Fall or balance concerns reported    No   Tobacco Cessation No Change   Warm-up and Cool-down Performed as group-led instruction   Resistance Training Performed Yes   VAD Patient? No     Pain Assessment   Currently in Pain? No/denies   Multiple Pain Sites No      Capillary Blood Glucose: No results found for this or any previous visit (from the past 24 hour(s)).      Exercise Prescription Changes - 04/27/17 1242      Response to Exercise   Blood Pressure (Admit) 118/62   Blood Pressure (Exercise) 102/64   Blood Pressure (Exit) 108/52   Heart Rate (Admit) 75 bpm   Heart Rate (Exercise) 84 bpm   Heart Rate (Exit) 65 bpm   Oxygen Saturation (Admit) 97 %   Oxygen Saturation (Exercise) 96 %   Oxygen Saturation (Exit) 100 %   Rating of Perceived Exertion (Exercise) 11   Perceived Dyspnea (Exercise) 0   Duration Continue with 30 min of aerobic exercise without signs/symptoms of physical distress.   Intensity THRR unchanged     Progression   Progression Continue to progress workloads to maintain intensity without signs/symptoms of physical distress.     Resistance Training   Training Prescription Yes   Weight orange bands   Reps 10-15   Time 10 Minutes     Oxygen   Oxygen Continuous   Liters 2-4      NuStep   Level 3   Minutes 34   METs 1.6      History  Smoking Status  . Never Smoker  Smokeless Tobacco  . Never Used    Goals Met:  Exercise tolerated well No report of cardiac concerns or symptoms Strength training completed today  Goals Unmet:  Not Applicable  Comments: Service time is from 1030 to 1230   Dr. Rush Farmer is Medical Director for Pulmonary Rehab at [facility].

## 2017-04-27 NOTE — Telephone Encounter (Signed)
Left a VM for patient to give the office a call back regarding prescription sent to pharmacy

## 2017-04-28 ENCOUNTER — Telehealth: Payer: Self-pay

## 2017-04-28 NOTE — Telephone Encounter (Signed)
Received PA request for Methocarbamol. PA submitted & is pending. [id number]

## 2017-05-01 ENCOUNTER — Other Ambulatory Visit: Payer: Self-pay | Admitting: Internal Medicine
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
## 2017-05-01 NOTE — Telephone Encounter (Signed)
Ok to refill x 1  

## 2017-05-01 NOTE — Telephone Encounter (Addendum)
Called [employer] patient assistance who stated that they will send [name]'s application to the processing department again as an urgent request. [name] states that her Xarelto this month will cost $53 and she is able to afford this at this time while we wait for a decision on her patient assistance.   [name]. [name], PharmD, BCPS, CPP Clinical Pharmacist Pager: [phone] Phone: [phone] 05/01/2017 9:59 AM

## 2017-05-02 ENCOUNTER — Encounter (HOSPITAL_COMMUNITY)
Admission: RE | Admit: 2017-05-02 | Discharge: 2017-05-02 | Disposition: A | Discharge status: Home / Self Care | Payer: PPO | Source: Ambulatory Visit | Attending: Pulmonary Disease | Admitting: Pulmonary Disease

## 2017-05-02 DIAGNOSIS — J9610 Chronic respiratory failure, unspecified whether with hypoxia or hypercapnia: Secondary | ICD-10-CM | POA: Diagnosis not present

## 2017-05-02 DIAGNOSIS — I5040 Unspecified combined systolic (congestive) and diastolic (congestive) heart failure: Secondary | ICD-10-CM | POA: Diagnosis not present

## 2017-05-02 DIAGNOSIS — R269 Unspecified abnormalities of gait and mobility: Secondary | ICD-10-CM | POA: Diagnosis not present

## 2017-05-03 NOTE — Progress Notes (Signed)
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             Core Components/Risk Factors/Patient Goals Review   Personal Goals Review Develop more efficient breathing techniques such as purse lipped breathing and diaphragmatic breathing and practicing self-pacing with activity.;Improve shortness of breath with ADL's;Increase knowledge of respiratory medications and ability to use respiratory devices properly.;Heart Failure       Review patient is doing well in pulmonary  rehab however her progress is slow. she demonstrates the pursed lip breathing technique independently. she is limited to progression related to her back issues. she is in the process of scheduling injections for back pain control and is hopeful that after the injections she will be able to tolerate more physical activity at rehab and at home.       Expected Outcomes see admission expected outcomes          Core Components/Risk Factors/Patient Goals at Discharge (Final Review):      Goals and Risk Factor Review - 05/03/17 1757      Core Components/Risk Factors/Patient Goals Review   Personal Goals Review Develop more efficient breathing techniques such as purse lipped breathing and diaphragmatic breathing and practicing self-pacing with activity.;Improve shortness of breath with ADL's;Increase knowledge of respiratory medications and ability to use respiratory devices properly.;Heart Failure   Review patient is doing well in pulmonary rehab however her progress is slow. she demonstrates the pursed lip breathing technique independently. she is limited to progression related to her back issues. she is in the process of scheduling injections for back pain control and is hopeful that after the injections she will be able to tolerate more physical activity at rehab and at home.   Expected Outcomes see admission expected outcomes      ITP Comments:   Comments: ITP REVIEW Pt is making slow progress toward pulmonary rehab goals after completing 6 sessions. Recommend continued exercise, life style modification, education, and utilization of breathing techniques to increase stamina and strength and decrease shortness of breath with exertion.

## 2017-05-03 NOTE — Telephone Encounter (Signed)
PA approved, form faxed back to pharmacy.

## 2017-05-04 ENCOUNTER — Encounter (HOSPITAL_COMMUNITY)
Admission: RE | Admit: 2017-05-04 | Discharge: 2017-05-04 | Disposition: A | Discharge status: Home / Self Care | Payer: PPO | Source: Ambulatory Visit | Attending: Pulmonary Disease | Admitting: Pulmonary Disease

## 2017-05-04 VITALS — Wt 219.6 lb

## 2017-05-04 DIAGNOSIS — J9611 Chronic respiratory failure with hypoxia: Secondary | ICD-10-CM | POA: Diagnosis not present

## 2017-05-04 NOTE — Progress Notes (Signed)
Daily Session Note  Patient Details  Name: [name] MRN: [id number] Date of Birth: May 28, 1951 Referring Provider:     Pulmonary Rehab Walk Test from 04/04/2017 in [place]  Referring Provider  Dr. Lake Bells      Encounter Date: 05/04/2017  Check In:     Session Check In - 05/04/17 1030      Check-In   Location [facility]   Staff Present Ramon Dredge, RN, MHA;Cleotilde Spadaccini Ysidro Evert, RN;Portia Rollene Rotunda, RN, BSN   Supervising physician immediately available to respond to emergencies [facility] immediately available   Physician(s) Dr. Justus Memory   Medication changes reported     No   Fall or balance concerns reported    No   Tobacco Cessation No Change   Warm-up and Cool-down Performed as group-led instruction   Resistance Training Performed Yes   VAD Patient? No     Pain Assessment   Currently in Pain? No/denies   Multiple Pain Sites No      Capillary Blood Glucose: No results found for this or any previous visit (from the past 24 hour(s)).      Exercise Prescription Changes - 05/04/17 1300      Response to Exercise   Blood Pressure (Admit) 114/62   Blood Pressure (Exercise) 102/50   Blood Pressure (Exit) 102/62   Heart Rate (Admit) 65 bpm   Heart Rate (Exercise) 87 bpm   Heart Rate (Exit) 63 bpm   Oxygen Saturation (Admit) 98 %   Oxygen Saturation (Exercise) 96 %   Oxygen Saturation (Exit) 98 %   Rating of Perceived Exertion (Exercise) 14   Perceived Dyspnea (Exercise) 2   Duration Continue with 45 min of aerobic exercise without signs/symptoms of physical distress.   Intensity THRR unchanged     Progression   Progression Continue to progress workloads to maintain intensity without signs/symptoms of physical distress.     Resistance Training   Training Prescription Yes   Weight orange bands   Reps 10-15   Time 10 Minutes     Oxygen   Oxygen Continuous   Liters 2-4     NuStep   Level 3   Minutes 17   METs 1.5     Track   Laps 3   Minutes 17      History  Smoking Status  . Never Smoker  Smokeless Tobacco  . Never Used    Goals Met:  Exercise tolerated well No report of cardiac concerns or symptoms Strength training completed today  Goals Unmet:  Not Applicable  Comments: Service time is from 1030 to 1220    Dr. Rush Farmer is Medical Director for Pulmonary Rehab at [facility].

## 2017-05-09 ENCOUNTER — Encounter (HOSPITAL_COMMUNITY)
Admission: RE | Admit: 2017-05-09 | Discharge: 2017-05-09 | Disposition: A | Discharge status: Home / Self Care | Payer: PPO | Source: Ambulatory Visit | Attending: Pulmonary Disease | Admitting: Pulmonary Disease

## 2017-05-09 VITALS — Wt 218.7 lb

## 2017-05-09 DIAGNOSIS — J9611 Chronic respiratory failure with hypoxia: Secondary | ICD-10-CM | POA: Insufficient documentation

## 2017-05-09 NOTE — Progress Notes (Signed)
Daily Session Note  Patient Details  Name: LINNAEA AHN MRN: [id number] Date of Birth: [birth year] Referring Provider:     Pulmonary Rehab Walk Test from 04/04/2017 in [place]  Referring Provider  Dr. Lake Bells      Encounter Date: 05/09/2017  Check In:   Capillary Blood Glucose: No results found for this or any previous visit (from the past 24 hour(s)).      Exercise Prescription Changes - 05/09/17 1200      Response to Exercise   Blood Pressure (Admit) 118/80   Blood Pressure (Exercise) 106/60   Blood Pressure (Exit) 92/50   Heart Rate (Admit) 64 bpm   Heart Rate (Exercise) 96 bpm   Heart Rate (Exit) 71 bpm   Oxygen Saturation (Admit) 98 %   Oxygen Saturation (Exercise) 93 %   Oxygen Saturation (Exit) 99 %   Rating of Perceived Exertion (Exercise) 15   Perceived Dyspnea (Exercise) 2.5   Duration Continue with 45 min of aerobic exercise without signs/symptoms of physical distress.   Intensity THRR unchanged     Progression   Progression Continue to progress workloads to maintain intensity without signs/symptoms of physical distress.     Resistance Training   Training Prescription Yes   Weight orange bands   Reps 10-15   Time 10 Minutes     Oxygen   Oxygen Continuous   Liters 2-4     NuStep   Level 4   Minutes 34   METs 1.7     Track   Laps 4   Minutes 17      History  Smoking Status  . Never Smoker  Smokeless Tobacco  . Never Used    Goals Met:  Exercise tolerated well No report of cardiac concerns or symptoms Strength training completed today  Goals Unmet:  Not Applicable  Comments: Service time is from 1030 to 1205    Dr. Rush Farmer is Medical Director for Pulmonary Rehab at [facility].

## 2017-05-11 ENCOUNTER — Encounter (HOSPITAL_COMMUNITY)
Admission: RE | Admit: 2017-05-11 | Discharge: 2017-05-11 | Disposition: A | Discharge status: Home / Self Care | Payer: PPO | Source: Ambulatory Visit | Attending: Pulmonary Disease | Admitting: Pulmonary Disease

## 2017-05-11 VITALS — Wt 217.6 lb

## 2017-05-11 DIAGNOSIS — J9611 Chronic respiratory failure with hypoxia: Secondary | ICD-10-CM | POA: Diagnosis not present

## 2017-05-11 NOTE — Progress Notes (Signed)
JACIE TRISTAN 66 y.o. female  DOB: [birth year] MRN: [id number]           Nutrition Note Spoke with pt. Pt is obese. There are some ways the pt can make her eating habits healthier. Pt's Rate Your Plate results reviewed with pt. Pt avoids most salty food; uses fresh/frozen food. Pt is pre-diabetic according to her last A1c. Per discussion, pt is aware of pre-diabetes. "My doctor and I are watching it. (My A1c) it stays between 5.7 and 6-something." Pt expressed understanding of the information reviewed via feedback method.    Nutrition Diagnosis ? Obesity related to excessive energy intake as evidenced by a BMI of 41.9  Nutrition Intervention ? Pt's individual nutrition plan and goals reviewed with pt. ? Benefits of adopting healthy eating habits discussed when pt's Rate Your Plate reviewed.  Goal(s) 1. Identify food quantities necessary to achieve wt loss of 1-2 lb per week to a goal wt loss of 6-24 lb at graduation from pulmonary rehab. 2. Pt to be able to name foods that affect blood glucose.   Plan:  Pt to attend Pulmonary Nutrition class - met 04/27/17 Will provide client-centered nutrition education as part of interdisciplinary care.   Monitor and evaluate progress toward nutrition goal with team.  Monitor and Evaluate progress toward nutrition goal with team.   Derek Mound, M.Ed, RD, LDN, CDE 05/11/2017 11:51 AM

## 2017-05-11 NOTE — Progress Notes (Signed)
Daily Session Note  Patient Details  Name: [name] MRN: [id number] Date of Birth: 11/11/1950 Referring Provider:     Pulmonary Rehab Walk Test from 04/04/2017 in [place]  Referring Provider  Dr. Lake Bells      Encounter Date: 05/11/2017  Check In:     Session Check In - 05/11/17 1213      Check-In   [facility]   Staff Present Su Hilt, MS, ACSM RCEP, Exercise Physiologist;Javeon Macmurray Ysidro Evert, RN;Other;Portia Rollene Rotunda, RN, BSN   Supervising physician immediately available to respond to emergencies [facility] immediately available   Physician(s) Dr. Bonner Puna   Medication changes reported     No   Fall or balance concerns reported    No   Tobacco Cessation No Change   Warm-up and Cool-down Performed as group-led instruction   Resistance Training Performed Yes   VAD Patient? No     Pain Assessment   Currently in Pain? No/denies   Multiple Pain Sites No      Capillary Blood Glucose: No results found for this or any previous visit (from the past 24 hour(s)).      Exercise Prescription Changes - 05/11/17 1200      Response to Exercise   Blood Pressure (Admit) 86/46  Gave gator-aide before exercise   Blood Pressure (Exercise) 96/52   Blood Pressure (Exit) 98/56   Heart Rate (Admit) 70 bpm   Heart Rate (Exercise) 67 bpm   Heart Rate (Exit) 65 bpm   Oxygen Saturation (Admit) 100 %   Oxygen Saturation (Exercise) 96 %   Oxygen Saturation (Exit) 100 %   Rating of Perceived Exertion (Exercise) 12   Perceived Dyspnea (Exercise) 1   Duration Continue with 45 min of aerobic exercise without signs/symptoms of physical distress.   Intensity THRR unchanged     Progression   Progression Continue to progress workloads to maintain intensity without signs/symptoms of physical distress.     Resistance Training   Training Prescription Yes   Weight orange bands   Reps 10-15   Time 10 Minutes     Oxygen   Oxygen  Continuous   Liters 2-4     NuStep   Level 4   Minutes 17   METs 1.6      History  Smoking Status  . Never Smoker  Smokeless Tobacco  . Never Used    Goals Met:  Exercise tolerated well No report of cardiac concerns or symptoms Strength training completed today  Goals Unmet:  Not Applicable  Comments: Service time is from 1030 to 1200    Dr. Rush Farmer is Medical Director for Pulmonary Rehab at [facility].

## 2017-05-15 ENCOUNTER — Encounter: Payer: Self-pay | Admitting: Adult Health

## 2017-05-15 ENCOUNTER — Other Ambulatory Visit: Payer: Self-pay | Admitting: Internal Medicine

## 2017-05-15 ENCOUNTER — Ambulatory Visit (INDEPENDENT_AMBULATORY_CARE_PROVIDER_SITE_OTHER): Payer: PPO | Admitting: Adult Health

## 2017-05-15 DIAGNOSIS — G4733 Obstructive sleep apnea (adult) (pediatric): Secondary | ICD-10-CM | POA: Diagnosis not present

## 2017-05-15 DIAGNOSIS — I5032 Chronic diastolic (congestive) heart failure: Secondary | ICD-10-CM | POA: Diagnosis not present

## 2017-05-15 DIAGNOSIS — R269 Unspecified abnormalities of gait and mobility: Secondary | ICD-10-CM | POA: Diagnosis not present

## 2017-05-15 DIAGNOSIS — I270 Primary pulmonary hypertension: Secondary | ICD-10-CM | POA: Diagnosis not present

## 2017-05-15 DIAGNOSIS — J9610 Chronic respiratory failure, unspecified whether with hypoxia or hypercapnia: Secondary | ICD-10-CM | POA: Diagnosis not present

## 2017-05-15 DIAGNOSIS — I5040 Unspecified combined systolic (congestive) and diastolic (congestive) heart failure: Secondary | ICD-10-CM | POA: Diagnosis not present

## 2017-05-15 NOTE — Addendum Note (Signed)
Addended by: Parke Poisson E on: 05/15/2017 10:40 AM   Modules accepted: Orders

## 2017-05-15 NOTE — Assessment & Plan Note (Signed)
Cont on CPAP At bedtime  With O2 2l/m  Check download   Plan  Patient Instructions  Continue on Oxygen 2l/m . Continue on CPAP At bedtime  With oxygen .  CPAP download.  Continue on current regimen .  Follow up  Dr. Lake Bells in 4 months and As needed   Please contact office for sooner follow up if symptoms do not improve or worsen or seek emergency care

## 2017-05-15 NOTE — Patient Instructions (Signed)
Continue on Oxygen 2l/m . Continue on CPAP At bedtime  With oxygen .  CPAP download.  Continue on current regimen .  Follow up  Dr. Lake Bells in 4 months and As needed   Please contact office for sooner follow up if symptoms do not improve or worsen or seek emergency care

## 2017-05-15 NOTE — Assessment & Plan Note (Signed)
Appears stable without evidence of volume overload.

## 2017-05-15 NOTE — Progress Notes (Signed)
Reviewed, agree 
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
## 2017-05-15 NOTE — Assessment & Plan Note (Signed)
Clinically stable  Cont on current regimen .

## 2017-05-16 ENCOUNTER — Encounter (HOSPITAL_COMMUNITY)
Admission: RE | Admit: 2017-05-16 | Discharge: 2017-05-16 | Disposition: A | Discharge status: Home / Self Care | Payer: PPO | Source: Ambulatory Visit | Attending: Pulmonary Disease | Admitting: Pulmonary Disease

## 2017-05-16 VITALS — Wt 219.6 lb

## 2017-05-16 DIAGNOSIS — J9611 Chronic respiratory failure with hypoxia: Secondary | ICD-10-CM

## 2017-05-16 DIAGNOSIS — I5040 Unspecified combined systolic (congestive) and diastolic (congestive) heart failure: Secondary | ICD-10-CM | POA: Diagnosis not present

## 2017-05-16 DIAGNOSIS — J9610 Chronic respiratory failure, unspecified whether with hypoxia or hypercapnia: Secondary | ICD-10-CM | POA: Diagnosis not present

## 2017-05-16 DIAGNOSIS — R269 Unspecified abnormalities of gait and mobility: Secondary | ICD-10-CM | POA: Diagnosis not present

## 2017-05-16 NOTE — Progress Notes (Signed)
Daily Session Note  Patient Details  Name: [name] MRN: [id number] Date of Birth: 1950-10-07 Referring Provider:     Pulmonary Rehab Walk Test from 04/04/2017 in [place]  Referring Provider  Dr. Lake Bells      Encounter Date: 05/16/2017  Check In:     Session Check In - 05/16/17 1229      Check-In   Location [facility]   Staff Present Su Hilt, MS, ACSM RCEP, Exercise Physiologist;Maria Whitaker, RN, BSN;Joann Rion, RN, Luisa Hart, RN, BSN   Supervising physician immediately available to respond to emergencies [facility] immediately available   Physician(s) Dr. Bonner Puna   Medication changes reported     No   Fall or balance concerns reported    No   Tobacco Cessation No Change   Warm-up and Cool-down Performed as group-led instruction   Resistance Training Performed Yes   VAD Patient? No     Pain Assessment   Currently in Pain? No/denies   Multiple Pain Sites No      Capillary Blood Glucose: No results found for this or any previous visit (from the past 24 hour(s)).      Exercise Prescription Changes - 05/16/17 1200      Response to Exercise   Blood Pressure (Admit) 104/64  Gave gator-aide before exercise   Blood Pressure (Exercise) 94/54   Blood Pressure (Exit) 98/60   Heart Rate (Admit) 65 bpm   Heart Rate (Exercise) 76 bpm   Heart Rate (Exit) 65 bpm   Oxygen Saturation (Admit) 100 %   Oxygen Saturation (Exercise) 97 %   Oxygen Saturation (Exit) 98 %   Rating of Perceived Exertion (Exercise) 15   Perceived Dyspnea (Exercise) 3   Duration Continue with 45 min of aerobic exercise without signs/symptoms of physical distress.   Intensity THRR unchanged     Progression   Progression Continue to progress workloads to maintain intensity without signs/symptoms of physical distress.     Resistance Training   Training Prescription Yes   Weight orange bands   Reps 10-15   Time 10 Minutes      Oxygen   Oxygen Continuous   Liters 2-4     NuStep   Level 4   Minutes 17   METs 1.7     Track   Laps 3   Minutes 17      History  Smoking Status  . Never Smoker  Smokeless Tobacco  . Never Used    Goals Met:  Exercise tolerated well No report of cardiac concerns or symptoms Strength training completed today  Goals Unmet:  Not Applicable  Comments: Service time is from 10:30a to 12:30p    Dr. Rush Farmer is Medical Director for Pulmonary Rehab at [facility].

## 2017-05-16 NOTE — Progress Notes (Signed)
While exercising at Pulmonary Rehab, [name] consistently uses 4 liters of continuous flow oxygen. This is provided by Pulmonary Rehab [facility]. At home, the patient currently uses a POC for their home oxygen system. While exercising at home, the patient was instructed to use 4 liters cont. In Pulmonary Rehab today, [name] walked the track utilizing their own home oxygen system. They used 3 liters pulsed which maintained their oxygen saturation above 82%. The patient states that while walking into the Dr.'s office and running small errands her oxygen saturation maintains above 90% on her POC on 3 liters pulsed. She states that she does have tanks at home that have 4 liters cont. capability. She states that she will use these tanks at home to exercise and her POC to run errands on 3 liters pulsed.

## 2017-05-17 DIAGNOSIS — M25522 Pain in left elbow: Secondary | ICD-10-CM | POA: Diagnosis not present

## 2017-05-17 DIAGNOSIS — G5601 Carpal tunnel syndrome, right upper limb: Secondary | ICD-10-CM | POA: Diagnosis not present

## 2017-05-17 DIAGNOSIS — M19022 Primary osteoarthritis, left elbow: Secondary | ICD-10-CM | POA: Diagnosis not present

## 2017-05-17 DIAGNOSIS — M79641 Pain in right hand: Secondary | ICD-10-CM | POA: Diagnosis not present

## 2017-05-18 ENCOUNTER — Encounter (HOSPITAL_COMMUNITY)
Admission: RE | Admit: 2017-05-18 | Discharge: 2017-05-18 | Disposition: A | Discharge status: Home / Self Care | Payer: PPO | Source: Ambulatory Visit | Attending: Pulmonary Disease | Admitting: Pulmonary Disease

## 2017-05-18 VITALS — Wt 220.0 lb

## 2017-05-18 DIAGNOSIS — J9611 Chronic respiratory failure with hypoxia: Secondary | ICD-10-CM | POA: Diagnosis not present

## 2017-05-18 NOTE — Progress Notes (Signed)
Daily Session Note  Patient Details  Name: [name] MRN: [id number] Date of Birth: 08/01/1951 Referring Provider:     Pulmonary Rehab Walk Test from 04/04/2017 in [place]  Referring Provider  Dr. Lake Bells      Encounter Date: 05/18/2017  Check In:   Capillary Blood Glucose: No results found for this or any previous visit (from the past 24 hour(s)).      Exercise Prescription Changes - 05/18/17 1159      Response to Exercise   Blood Pressure (Admit) 108/60   Blood Pressure (Exercise) 108/62   Blood Pressure (Exit) 92/52   Heart Rate (Admit) 62 bpm   Heart Rate (Exercise) 106 bpm   Heart Rate (Exit) 71 bpm   Oxygen Saturation (Admit) 97 %   Oxygen Saturation (Exercise) 93 %   Oxygen Saturation (Exit) 98 %   Rating of Perceived Exertion (Exercise) 13   Perceived Dyspnea (Exercise) 1   Duration Continue with 45 min of aerobic exercise without signs/symptoms of physical distress.   Intensity THRR unchanged     Progression   Progression Continue to progress workloads to maintain intensity without signs/symptoms of physical distress.     Resistance Training   Training Prescription Yes   Weight orange bands   Reps 10-15   Time 10 Minutes     Oxygen   Oxygen Continuous   Liters 2-4     NuStep   Level 4   Minutes 34   METs 1.8     Track   Laps 4   Minutes 17      History  Smoking Status  . Never Smoker  Smokeless Tobacco  . Never Used    Goals Met:  Independence with exercise equipment Using PLB without cueing & demonstrates good technique Exercise tolerated well No report of cardiac concerns or symptoms Strength training completed today  Goals Unmet:  Not Applicable  Comments: Service time is from 1030 to 1200    Dr. Rush Farmer is Medical Director for Pulmonary Rehab at [facility].

## 2017-05-19 ENCOUNTER — Telehealth (HOSPITAL_COMMUNITY): Payer: Self-pay | Admitting: Pharmacist

## 2017-05-19 NOTE — Telephone Encounter (Signed)
Received a message from Ms. Woolman that she received a denial from [employer] patient assistance for her Xarelto because she has insurance. Once I get the denial fax, I will call and verify the reason for the denial. In the meantime, if she runs out of Xarelto, will provide her with samples.   Ruta Hinds. Velva Harman, PharmD, BCPS, CPP Clinical Pharmacist Pager: [phone] Phone: [phone] 05/19/2017 10:08 AM

## 2017-05-23 ENCOUNTER — Telehealth (HOSPITAL_COMMUNITY): Payer: Self-pay | Admitting: *Deleted

## 2017-05-23 ENCOUNTER — Encounter (HOSPITAL_COMMUNITY)
Admission: RE | Admit: 2017-05-23 | Discharge: 2017-05-23 | Disposition: A | Discharge status: Home / Self Care | Payer: PPO | Source: Ambulatory Visit | Attending: Pulmonary Disease | Admitting: Pulmonary Disease

## 2017-05-23 VITALS — Wt 219.1 lb

## 2017-05-23 DIAGNOSIS — J9611 Chronic respiratory failure with hypoxia: Secondary | ICD-10-CM

## 2017-05-23 IMAGING — DX DG CHEST 1V PORT
1 series · 1 of 1 positions shown · non-contrast
Comparison: 11/06/2016.

CLINICAL DATA: CHF.

EXAM:
PORTABLE CHEST 1 VIEW

[chest ap]
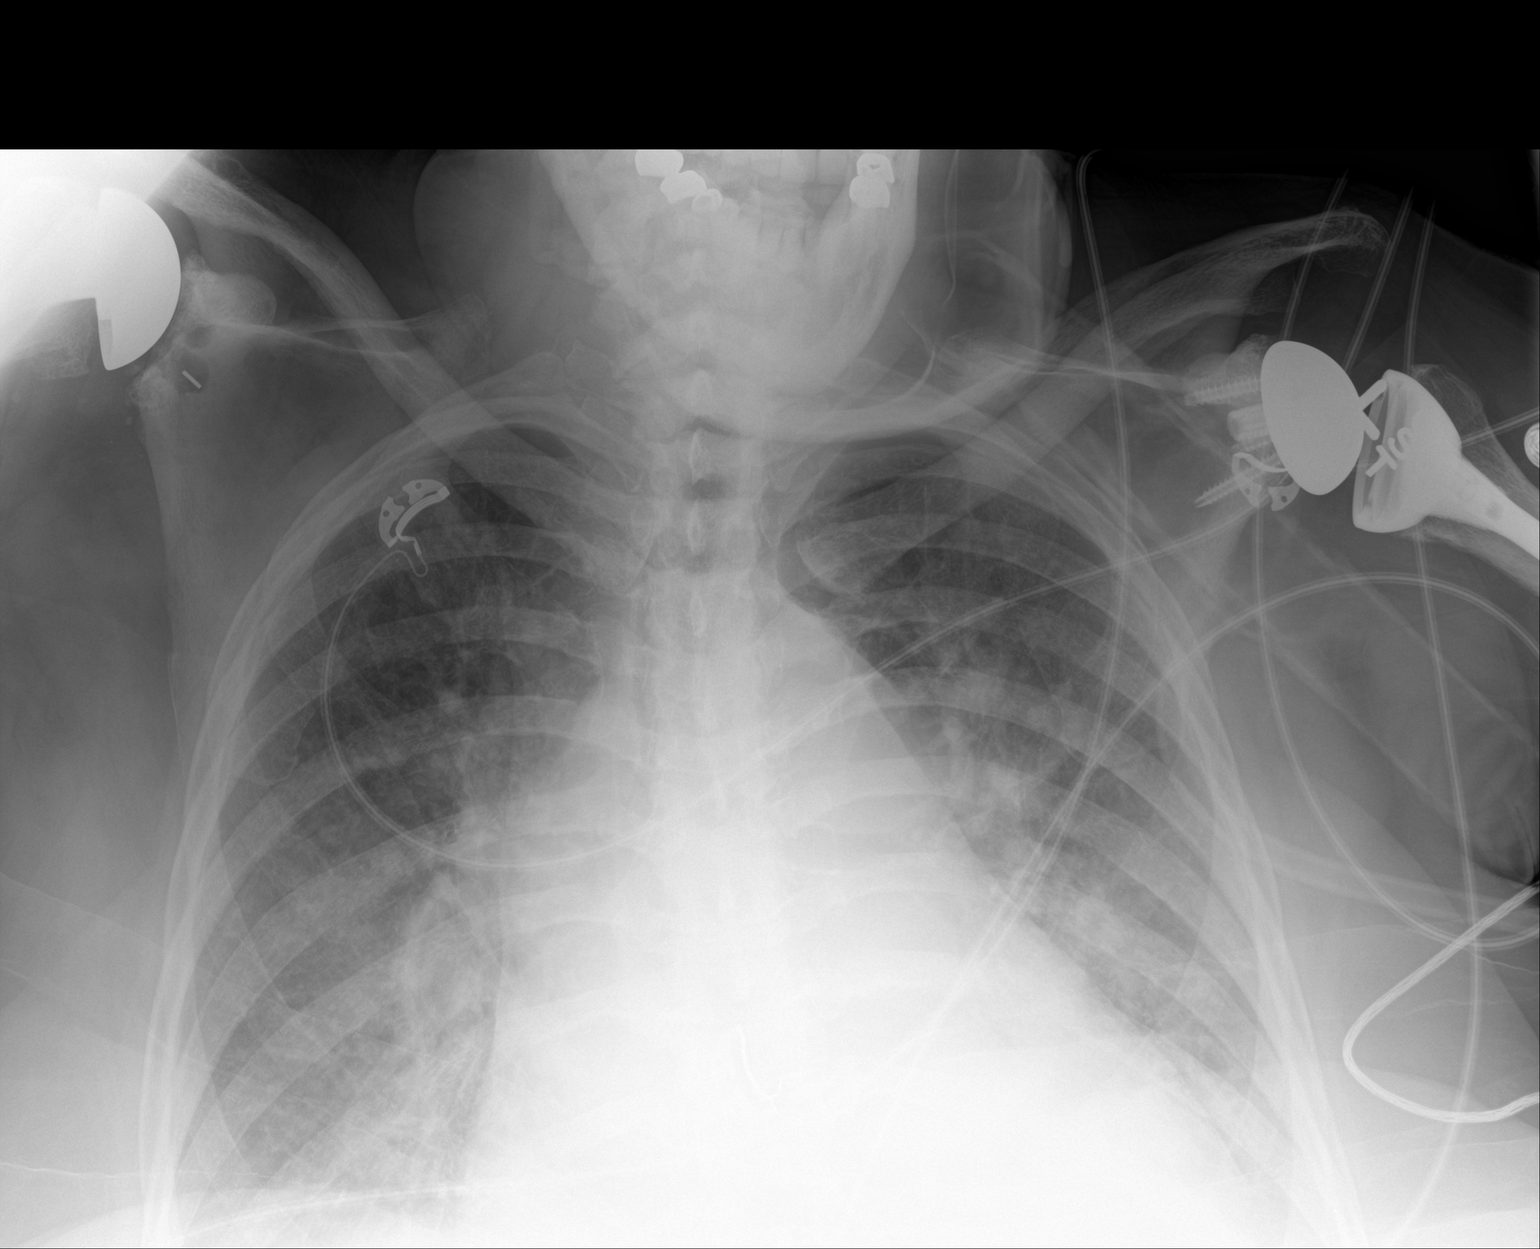

[1 of 1 positions shown; findings below may reference images not displayed]

FINDINGS: Cardiomegaly with diffuse bilateral pulmonary infiltrates/edema and
bilateral pleural effusions. Findings consistent with congestive
heart failure. Slight worsening from prior exam. No pneumothorax.
Bilateral shoulder replacements.
IMPRESSION: Cardiomegaly with bilateral pulmonary infiltrates most consistent
pulmonary edema. Slight worsening from prior exam. Small bilateral
pleural effusions again noted. No pneumothorax.

## 2017-05-23 NOTE — Progress Notes (Signed)
Daily Session Note  Patient Details  Name: [name] MRN: [id number] Date of Birth: Jul 11, 1951 Referring Provider:     Pulmonary Rehab Walk Test from 04/04/2017 in [place]  Referring Provider  Dr. Lake Bells      Encounter Date: 05/23/2017  Check In:     Session Check In - 05/23/17 1030      Check-In   Location [facility]   Staff Present Rodney Langton, RN;Molly diVincenzo, MS, ACSM RCEP, Exercise Physiologist;Eirene Rather Rollene Rotunda, RN, BSN   Supervising physician immediately available to respond to emergencies [facility] immediately available   Physician(s) Dr. Carolin Sicks   Medication changes reported     No   Fall or balance concerns reported    No   Tobacco Cessation No Change   Warm-up and Cool-down Performed as group-led instruction   Resistance Training Performed Yes   VAD Patient? No     Pain Assessment   Currently in Pain? No/denies   Multiple Pain Sites No      Capillary Blood Glucose: No results found for this or any previous visit (from the past 24 hour(s)).      Exercise Prescription Changes - 05/23/17 1639      Response to Exercise   Blood Pressure (Admit) 108/56   Blood Pressure (Exercise) 90/44   Blood Pressure (Exit) 90/52   Heart Rate (Admit) 59 bpm   Heart Rate (Exercise) 95 bpm   Heart Rate (Exit) 72 bpm   Oxygen Saturation (Admit) 100 %   Oxygen Saturation (Exercise) 96 %   Oxygen Saturation (Exit) 97 %   Rating of Perceived Exertion (Exercise) 15   Perceived Dyspnea (Exercise) 2   Duration Continue with 45 min of aerobic exercise without signs/symptoms of physical distress.   Intensity THRR unchanged     Progression   Progression Continue to progress workloads to maintain intensity without signs/symptoms of physical distress.     Resistance Training   Training Prescription Yes   Weight orange bands   Reps 10-15   Time 10 Minutes     Oxygen   Oxygen Continuous   Liters 2-4     NuStep   Level 4   Minutes 34   METs 1.7     Track   Laps 3   Minutes 17      History  Smoking Status  . Never Smoker  Smokeless Tobacco  . Never Used    Goals Met:  Improved SOB with ADL's Using PLB without cueing & demonstrates good technique Exercise tolerated well No report of cardiac concerns or symptoms Strength training completed today  Goals Unmet:  Not Applicable  Comments: Service time is from 1030 to 1200   Dr. Rush Farmer is Medical Director for Pulmonary Rehab at [facility]

## 2017-05-23 NOTE — Telephone Encounter (Signed)
Received fax from [facility], pt needs clearance to hold Xarelto for 3 days prior to an injection on 05/26/17.   Per Dr Aundra Dubin, "She has history of CVA, minimize time off Xarelto, would only stop 2 days prior"  Note faxed back to them at [phone]

## 2017-05-24 DIAGNOSIS — G4733 Obstructive sleep apnea (adult) (pediatric): Secondary | ICD-10-CM | POA: Diagnosis not present

## 2017-05-24 DIAGNOSIS — I5040 Unspecified combined systolic (congestive) and diastolic (congestive) heart failure: Secondary | ICD-10-CM | POA: Diagnosis not present

## 2017-05-24 DIAGNOSIS — J9610 Chronic respiratory failure, unspecified whether with hypoxia or hypercapnia: Secondary | ICD-10-CM | POA: Diagnosis not present

## 2017-05-24 DIAGNOSIS — R269 Unspecified abnormalities of gait and mobility: Secondary | ICD-10-CM | POA: Diagnosis not present

## 2017-05-25 ENCOUNTER — Encounter (HOSPITAL_COMMUNITY)
Admission: RE | Admit: 2017-05-25 | Discharge: 2017-05-25 | Disposition: A | Discharge status: Home / Self Care | Payer: PPO | Source: Ambulatory Visit | Attending: Pulmonary Disease | Admitting: Pulmonary Disease

## 2017-05-25 VITALS — Wt 217.6 lb

## 2017-05-25 DIAGNOSIS — J9611 Chronic respiratory failure with hypoxia: Secondary | ICD-10-CM | POA: Diagnosis not present

## 2017-05-25 NOTE — Progress Notes (Signed)
Daily Session Note  Patient Details  Name: [name] MRN: [id number] Date of Birth: [birth year] Referring Provider:     Pulmonary Rehab Walk Test from 04/04/2017 in [place]  Referring Provider  Dr. Lake Bells      Encounter Date: 05/25/2017  Check In:     Session Check In - 05/25/17 1055      Check-In   Location [facility]   Staff Present Su Hilt, MS, ACSM RCEP, Exercise Physiologist;Karole Oo Rollene Rotunda, RN, BSN   Supervising physician immediately available to respond to emergencies [facility] immediately available   Physician(s) Dr. Wynelle Cleveland   Fall or balance concerns reported    No   Tobacco Cessation No Change   Warm-up and Cool-down Performed as group-led instruction   Resistance Training Performed Yes   VAD Patient? No     Pain Assessment   Currently in Pain? No/denies   Multiple Pain Sites No      Capillary Blood Glucose: No results found for this or any previous visit (from the past 24 hour(s)).      Exercise Prescription Changes - 05/25/17 1250      Response to Exercise   Blood Pressure (Admit) 100/52   Blood Pressure (Exercise) 110/58   Blood Pressure (Exit) 98/60   Heart Rate (Admit) 65 bpm   Heart Rate (Exercise) 92 bpm   Heart Rate (Exit) 71 bpm   Oxygen Saturation (Admit) 99 %   Oxygen Saturation (Exercise) 98 %   Oxygen Saturation (Exit) 100 %   Rating of Perceived Exertion (Exercise) 15   Perceived Dyspnea (Exercise) 1   Duration Continue with 45 min of aerobic exercise without signs/symptoms of physical distress.   Intensity THRR unchanged     Progression   Progression Continue to progress workloads to maintain intensity without signs/symptoms of physical distress.     Resistance Training   Training Prescription Yes   Weight orange bands   Reps 10-15   Time 10 Minutes     Oxygen   Oxygen Continuous   Liters 2-4     NuStep   Level 4   Minutes 17   METs 1.7     Track   Laps 4   Minutes 17      History  Smoking Status  . Never Smoker  Smokeless Tobacco  . Never Used    Goals Met:  Using PLB without cueing & demonstrates good technique Exercise tolerated well No report of cardiac concerns or symptoms Strength training completed today  Goals Unmet:  Not Applicable  Comments: Service time is from 1030 to 1220   Dr. Rush Farmer is Medical Director for Pulmonary Rehab at [facility].

## 2017-05-26 ENCOUNTER — Encounter: Payer: Self-pay | Admitting: Internal Medicine

## 2017-05-26 NOTE — Progress Notes (Signed)
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   Continue Psychosocial Services  No Follow up required      Education: Education Goals: Education classes will be provided on a weekly basis, covering required topics. Participant will state understanding/return demonstration of topics presented.  Learning  Barriers/Preferences:   Education Topics: Risk Factor Reduction:  -Group instruction that is supported by a PowerPoint presentation. Instructor discusses the definition of a risk factor, different risk factors for pulmonary disease, and how the heart and lungs work together.     Nutrition for Pulmonary Patient:  -Group instruction provided by PowerPoint slides, verbal discussion, and written materials to support subject matter. The instructor gives an explanation and review of healthy diet recommendations, which includes a discussion on weight management, recommendations for fruit and vegetable consumption, as well as protein, fluid, caffeine, fiber, sodium, sugar, and alcohol. Tips for eating when patients are short of breath are discussed.   PULMONARY REHAB OTHER RESPIRATORY from 05/25/2017 in [place]  Date  04/27/17  Educator  Nuritionist  Instruction Review Code  2- meets goals/outcomes      Pursed Lip Breathing:  -Group instruction that is supported by demonstration and informational handouts. Instructor discusses the benefits of pursed lip and diaphragmatic breathing and detailed demonstration on how to preform both.     Oxygen Safety:  -Group instruction provided by PowerPoint, verbal discussion, and written material to support subject matter. There is an overview of "What is Oxygen" and "Why do we need it".  Instructor also reviews how to create a safe environment for oxygen use, the importance of using oxygen as prescribed, and the risks of noncompliance. There is a brief discussion on traveling with oxygen and resources the patient may utilize.   PULMONARY REHAB OTHER RESPIRATORY from 05/25/2017 in [place]  Date  04/13/17  Educator  Truddie Crumble  Instruction Review Code  2- meets goals/outcomes      Oxygen Equipment:  -Group instruction provided by [employer] Staff utilizing handouts, written materials, and  equipment demonstrations.   Signs and Symptoms:  -Group instruction provided by written material and verbal discussion to support subject matter. Warning signs and symptoms of infection, stroke, and heart attack are reviewed and when to call the physician/911 reinforced. Tips for preventing the spread of infection discussed.   PULMONARY REHAB OTHER RESPIRATORY from 05/25/2017 in [place]  Date  05/25/17  Educator  RN  Instruction Review Code  2- meets goals/outcomes      Advanced Directives:  -Group instruction provided by verbal instruction and written material to support subject matter. Instructor reviews Advanced Directive laws and proper instruction for filling out document.   Pulmonary Video:  -Group video education that reviews the importance of medication and oxygen compliance, exercise, good nutrition, pulmonary hygiene, and pursed lip and diaphragmatic breathing for the pulmonary patient.   Exercise for the Pulmonary Patient:  -Group instruction that is supported by a PowerPoint presentation. Instructor discusses benefits of  exercise, core components of exercise, frequency, duration, and intensity of an exercise routine, importance of utilizing pulse oximetry during exercise, safety while exercising, and options of places to exercise outside of rehab.     PULMONARY REHAB OTHER RESPIRATORY from 05/25/2017 in [place]  Date  05/16/17  Educator  ep  Instruction Review Code  2- meets goals/outcomes      Pulmonary Medications:  -Verbally interactive group education provided by instructor with focus on inhaled medications and proper administration.   PULMONARY REHAB OTHER RESPIRATORY from 05/25/2017 in [place]  Date  05/04/17  Educator  pharmacist  Instruction Review Code  2- meets goals/outcomes      Anatomy and Physiology of the Respiratory System and Intimacy:  -Group  instruction provided by PowerPoint, verbal discussion, and written material to support subject matter. Instructor reviews respiratory cycle and anatomical components of the respiratory system and their functions. Instructor also reviews differences in obstructive and restrictive respiratory diseases with examples of each. Intimacy, Sex, and Sexuality differences are reviewed with a discussion on how relationships can change when diagnosed with pulmonary disease. Common sexual concerns are reviewed.   MD DAY -A group question and answer session with a medical doctor that allows participants to ask questions that relate to their pulmonary disease state.   OTHER EDUCATION -Group or individual verbal, written, or video instructions that support the educational goals of the pulmonary rehab program.   Knowledge Questionnaire Score:     Knowledge Questionnaire Score - 04/04/17 0914      Knowledge Questionnaire Score   Pre Score 13/13      Core Components/Risk Factors/Patient Goals at Admission:     Personal Goals and Risk Factors at Admission - 03/27/17 1324      Core Components/Risk Factors/Patient Goals on Admission   Improve shortness of breath with ADL's Yes   Intervention Provide education, individualized exercise plan and daily activity instruction to help decrease symptoms of SOB with activities of daily living.   Expected Outcomes Short Term: Achieves a reduction of symptoms when performing activities of daily living.   Develop more efficient breathing techniques such as purse lipped breathing and diaphragmatic breathing; and practicing self-pacing with activity Yes   Intervention Provide education, demonstration and support about specific breathing techniuqes utilized for more efficient breathing. Include techniques such as pursed lipped breathing, diaphragmatic breathing and self-pacing activity.   Expected Outcomes Short Term: Participant will be able to demonstrate and use  breathing techniques as needed throughout daily activities.   Increase knowledge of respiratory medications and ability to use respiratory devices properly  Yes   Intervention Provide education and demonstration as needed of appropriate use of medications, inhalers, and oxygen therapy.   Expected Outcomes Short Term: Achieves understanding of medications use. Understands that oxygen is a medication prescribed by physician. Demonstrates appropriate use of inhaler and oxygen therapy.   Heart Failure Yes   Intervention Provide a combined exercise and nutrition program that is supplemented with education, support and counseling about heart failure. Directed toward relieving symptoms such as shortness of breath, decreased exercise tolerance, and extremity edema.      Core Components/Risk Factors/Patient Goals Review:      Goals and Risk Factor Review    Row Name 05/03/17 1757 05/26/17 1158           Core Components/Risk Factors/Patient Goals Review   Personal Goals Review Develop more efficient breathing techniques such as purse lipped breathing and diaphragmatic  breathing and practicing self-pacing with activity.;Improve shortness of breath with ADL's;Increase knowledge of respiratory medications and ability to use respiratory devices properly.;Heart Failure Develop more efficient breathing techniques such as purse lipped breathing and diaphragmatic breathing and practicing self-pacing with activity.;Improve shortness of breath with ADL's;Increase knowledge of respiratory medications and ability to use respiratory devices properly.;Heart Failure      Review patient is doing well in pulmonary rehab however her progress is slow. she demonstrates the pursed lip breathing technique independently. she is limited to progression related to her back issues. she is in the process of scheduling injections for back pain control and is hopeful that after the injections she will be able to tolerate more physical  activity at rehab and at home. patient is doing well with seated exercises however her effort to walk is extremely poor. she is limited by back pain and injections have not helped the way she was hoping. will continue to monitor      Expected Outcomes see admission expected outcomes see admission expected outcomes         Core Components/Risk Factors/Patient Goals at Discharge (Final Review):      Goals and Risk Factor Review - 05/26/17 1158      Core Components/Risk Factors/Patient Goals Review   Personal Goals Review Develop more efficient breathing techniques such as purse lipped breathing and diaphragmatic breathing and practicing self-pacing with activity.;Improve shortness of breath with ADL's;Increase knowledge of respiratory medications and ability to use respiratory devices properly.;Heart Failure   Review patient is doing well with seated exercises however her effort to walk is extremely poor. she is limited by back pain and injections have not helped the way she was hoping. will continue to monitor   Expected Outcomes see admission expected outcomes      ITP Comments:   Comments: ITP REVIEW Pt is making extremely slow progress toward pulmonary rehab goals after completing 13 sessions. Recommend continued exercise, life style modification, education, and utilization of breathing techniques to increase stamina and strength and decrease shortness of breath with exertion.

## 2017-05-30 ENCOUNTER — Encounter (HOSPITAL_COMMUNITY)
Admission: RE | Admit: 2017-05-30 | Discharge: 2017-05-30 | Disposition: A | Discharge status: Home / Self Care | Payer: PPO | Source: Ambulatory Visit | Attending: Pulmonary Disease | Admitting: Pulmonary Disease

## 2017-05-30 ENCOUNTER — Telehealth (HOSPITAL_COMMUNITY): Payer: Self-pay | Admitting: Pharmacist

## 2017-05-30 DIAGNOSIS — J9611 Chronic respiratory failure with hypoxia: Secondary | ICD-10-CM

## 2017-05-30 NOTE — Telephone Encounter (Signed)
Called [employer] [facility] and was notified that Ms. Matusik was denied patient assistance for her Xarelto 2/2 not meeting the 4% Rx spending requirement. She had spent $754 through July and needs to have spent $878.40 to qualify. I have called Ms. Nila and relayed this info to her but she is now through the coverage gap ([employer] >$1200 but has [employer] foundation to cover the cost) and her Xarelto is only $21/mo so will no longer pursue patient assistance.   Ruta Hinds. Velva Harman, PharmD, BCPS, CPP Clinical Pharmacist Pager: [phone] Phone: [phone] 05/30/2017 9:50 AM

## 2017-06-01 ENCOUNTER — Encounter (HOSPITAL_COMMUNITY)
Admission: RE | Admit: 2017-06-01 | Discharge: 2017-06-01 | Disposition: A | Discharge status: Home / Self Care | Payer: PPO | Source: Ambulatory Visit | Attending: Pulmonary Disease | Admitting: Pulmonary Disease

## 2017-06-01 DIAGNOSIS — J9611 Chronic respiratory failure with hypoxia: Secondary | ICD-10-CM | POA: Diagnosis not present

## 2017-06-01 NOTE — Progress Notes (Signed)
Daily Session Note  Patient Details  Name: [name] MRN: [id number] Date of Birth: 1951/01/25 Referring Provider:     Pulmonary Rehab Walk Test from 04/04/2017 in [place]  Referring Provider  Dr. Lake Bells      Encounter Date: 06/01/2017  Check In:   Capillary Blood Glucose: No results found for this or any previous visit (from the past 24 hour(s)).      Exercise Prescription Changes - 06/01/17 1200      Response to Exercise   Blood Pressure (Admit) 112/50   Blood Pressure (Exercise) 110/64   Blood Pressure (Exit) 104/70   Heart Rate (Admit) 69 bpm   Heart Rate (Exercise) 98 bpm   Heart Rate (Exit) 70 bpm   Oxygen Saturation (Admit) 99 %   Oxygen Saturation (Exercise) 95 %   Oxygen Saturation (Exit) 98 %   Rating of Perceived Exertion (Exercise) 13   Perceived Dyspnea (Exercise) 1   Duration Continue with 45 min of aerobic exercise without signs/symptoms of physical distress.   Intensity THRR unchanged     Progression   Progression Continue to progress workloads to maintain intensity without signs/symptoms of physical distress.     Resistance Training   Training Prescription Yes   Weight orange bands   Reps 10-15   Time 10 Minutes     Oxygen   Oxygen Continuous   Liters 2-4     NuStep   Level 4   Minutes 17   METs 1.7     Track   Laps 5   Minutes 17      History  Smoking Status  . Never Smoker  Smokeless Tobacco  . Never Used    Goals Met:  Exercise tolerated well No report of cardiac concerns or symptoms Strength training completed today  Goals Unmet:  Not Applicable  Comments: Service time is from 10:30a to 12:10p    Dr. Rush Farmer is Medical Director for Pulmonary Rehab at [facility].

## 2017-06-02 DIAGNOSIS — R269 Unspecified abnormalities of gait and mobility: Secondary | ICD-10-CM | POA: Diagnosis not present

## 2017-06-02 DIAGNOSIS — J9610 Chronic respiratory failure, unspecified whether with hypoxia or hypercapnia: Secondary | ICD-10-CM | POA: Diagnosis not present

## 2017-06-02 DIAGNOSIS — I5040 Unspecified combined systolic (congestive) and diastolic (congestive) heart failure: Secondary | ICD-10-CM | POA: Diagnosis not present

## 2017-06-05 ENCOUNTER — Encounter: Payer: Self-pay | Admitting: Internal Medicine

## 2017-06-06 ENCOUNTER — Encounter (HOSPITAL_COMMUNITY)
Admission: RE | Admit: 2017-06-06 | Discharge: 2017-06-06 | Disposition: A | Discharge status: Home / Self Care | Payer: PPO | Source: Ambulatory Visit | Attending: Pulmonary Disease | Admitting: Pulmonary Disease

## 2017-06-06 VITALS — Wt 218.0 lb

## 2017-06-06 DIAGNOSIS — J9611 Chronic respiratory failure with hypoxia: Secondary | ICD-10-CM | POA: Diagnosis not present

## 2017-06-06 NOTE — Progress Notes (Signed)
Daily Session Note  Patient Details  Name: [name] MRN: [id number] Date of Birth: [birth year] Referring Provider:     Pulmonary Rehab Walk Test from 04/04/2017 in [place]  Referring Provider  Dr. Lake Bells      Encounter Date: 06/06/2017  Check In:     Session Check In - 06/06/17 1232      Check-In   Location [facility]   Staff Present Su Hilt, MS, ACSM RCEP, Exercise Physiologist;Anastasia Tompson Ysidro Evert, RN;Other   Supervising physician immediately available to respond to emergencies [facility] immediately available   Physician(s) Dr. Wendee Beavers   Medication changes reported     No   Fall or balance concerns reported    No   Tobacco Cessation No Change   Warm-up and Cool-down Performed as group-led instruction   Resistance Training Performed Yes   VAD Patient? No     Pain Assessment   Currently in Pain? No/denies   Multiple Pain Sites No      Capillary Blood Glucose: No results found for this or any previous visit (from the past 24 hour(s)).      Exercise Prescription Changes - 06/06/17 1200      Response to Exercise   Blood Pressure (Admit) 108/78   Blood Pressure (Exercise) 106/50   Blood Pressure (Exit) 112/64   Heart Rate (Admit) 61 bpm   Heart Rate (Exercise) 87 bpm   Heart Rate (Exit) 65 bpm   Oxygen Saturation (Admit) 98 %   Oxygen Saturation (Exercise) 96 %   Oxygen Saturation (Exit) 98 %   Rating of Perceived Exertion (Exercise) 15   Perceived Dyspnea (Exercise) 1   Duration Continue with 45 min of aerobic exercise without signs/symptoms of physical distress.   Intensity THRR unchanged     Progression   Progression Continue to progress workloads to maintain intensity without signs/symptoms of physical distress.     Resistance Training   Training Prescription Yes   Weight orange bands   Reps 10-15   Time 10 Minutes     Oxygen   Oxygen Continuous   Liters 2.4     NuStep   Level 4   Minutes 34   METs 1.8     Track   Laps 5   Minutes 17      History  Smoking Status  . Never Smoker  Smokeless Tobacco  . Never Used    Goals Met:  Exercise tolerated well No report of cardiac concerns or symptoms Strength training completed today  Goals Unmet:  Not Applicable  Comments: Service time is from 1030 to 1210    Dr. Rush Farmer is Medical Director for Pulmonary Rehab at [facility].

## 2017-06-07 NOTE — Telephone Encounter (Signed)
It appears that the hospital team took this medicationit off her list at discharge but it wasn't really discontinued? although I would not have that information. And her last refills were not done by primary care.   If patient has been taking allopurinol all along and not having gout Please refill the medication as indicated and now for 6 months. She did have a recent uric acid level.  Overall it appears like multiple providers adding and subtracting medicines  Make these request very confusing and difficult to clarify.Marland Kitchen

## 2017-06-08 ENCOUNTER — Encounter (HOSPITAL_COMMUNITY)
Admission: RE | Admit: 2017-06-08 | Discharge: 2017-06-08 | Disposition: A | Discharge status: Home / Self Care | Payer: PPO | Source: Ambulatory Visit | Attending: Pulmonary Disease | Admitting: Pulmonary Disease

## 2017-06-08 VITALS — Wt 217.2 lb

## 2017-06-08 DIAGNOSIS — J9611 Chronic respiratory failure with hypoxia: Secondary | ICD-10-CM | POA: Diagnosis not present

## 2017-06-08 NOTE — Progress Notes (Signed)
Daily Session Note  Patient Details  Name: [name] MRN: [id number] Date of Birth: 05/28/1951 Referring Provider:     Pulmonary Rehab Walk Test from 04/04/2017 in [place]  Referring Provider  Dr. Lake Bells      Encounter Date: 06/08/2017  Check In:     Session Check In - 06/08/17 1009      Check-In   Location [facility]   Staff Present Rodney Langton, RN;Molly diVincenzo, MS, ACSM RCEP, Exercise Physiologist   Supervising physician immediately available to respond to emergencies [facility] immediately available   Physician(s) Dr. Zigmund Daniel   Medication changes reported     No   Fall or balance concerns reported    No   Tobacco Cessation No Change   Warm-up and Cool-down Performed as group-led instruction   Resistance Training Performed Yes   VAD Patient? No     Pain Assessment   Currently in Pain? No/denies   Multiple Pain Sites No      Capillary Blood Glucose: No results found for this or any previous visit (from the past 24 hour(s)).      Exercise Prescription Changes - 06/08/17 1300      Response to Exercise   Blood Pressure (Admit) 106/50   Blood Pressure (Exercise) 110/66   Blood Pressure (Exit) 104/60   Heart Rate (Admit) 73 bpm   Heart Rate (Exercise) 106 bpm   Heart Rate (Exit) 67 bpm   Oxygen Saturation (Admit) 98 %   Oxygen Saturation (Exercise) 96 %   Oxygen Saturation (Exit) 98 %   Rating of Perceived Exertion (Exercise) 15   Perceived Dyspnea (Exercise) 1   Duration Continue with 45 min of aerobic exercise without signs/symptoms of physical distress.   Intensity THRR unchanged     Progression   Progression Continue to progress workloads to maintain intensity without signs/symptoms of physical distress.     Resistance Training   Training Prescription Yes   Weight orange bands   Reps 10-15   Time 10 Minutes     Oxygen   Oxygen Continuous   Liters 2.4     NuStep   Level 4   Minutes 17   METs 1.8     Track   Laps 5   Minutes 17      History  Smoking Status  . Never Smoker  Smokeless Tobacco  . Never Used    Goals Met:  Exercise tolerated well No report of cardiac concerns or symptoms Strength training completed today  Goals Unmet:  Not Applicable  Comments: Service time is from 1030 to 1225    Dr. Rush Farmer is Medical Director for Pulmonary Rehab at [facility].

## 2017-06-09 ENCOUNTER — Other Ambulatory Visit: Payer: Self-pay

## 2017-06-09 MED ORDER — ALLOPURINOL 100 MG PO TABS: 100.0000 mg | ORAL_TABLET | Freq: Two times a day (BID) | ORAL | 3 refills | Status: DC

## 2017-06-10 ENCOUNTER — Other Ambulatory Visit: Payer: Self-pay | Admitting: Internal Medicine

## 2017-06-13 ENCOUNTER — Encounter (HOSPITAL_COMMUNITY)
Admission: RE | Admit: 2017-06-13 | Discharge: 2017-06-13 | Disposition: A | Discharge status: Home / Self Care | Payer: PPO | Source: Ambulatory Visit | Attending: Pulmonary Disease | Admitting: Pulmonary Disease

## 2017-06-13 VITALS — Wt 220.2 lb

## 2017-06-13 DIAGNOSIS — J9611 Chronic respiratory failure with hypoxia: Secondary | ICD-10-CM | POA: Diagnosis not present

## 2017-06-13 NOTE — Progress Notes (Signed)
Daily Session Note  Patient Details  Name: [name] MRN: [id number] Date of Birth: 03-Feb-1951 Referring Provider:     Pulmonary Rehab Walk Test from 04/04/2017 in [place]  Referring Provider  Dr. Lake Bells      Encounter Date: 06/13/2017  Check In:     Session Check In - 06/13/17 1102      Check-In   Location [facility]   Staff Present Su Hilt, MS, ACSM RCEP, Exercise Physiologist;Lisa Ysidro Evert, RN;Portia Rollene Rotunda, RN, BSN   Supervising physician immediately available to respond to emergencies [facility] immediately available   Physician(s) Dr. Zigmund Daniel   Medication changes reported     No   Fall or balance concerns reported    No   Tobacco Cessation No Change   Warm-up and Cool-down Performed as group-led instruction   Resistance Training Performed Yes   VAD Patient? No     Pain Assessment   Currently in Pain? No/denies   Multiple Pain Sites No      Capillary Blood Glucose: No results found for this or any previous visit (from the past 24 hour(s)).      Exercise Prescription Changes - 06/13/17 1200      Response to Exercise   Blood Pressure (Admit) 100/68   Blood Pressure (Exercise) 110/66   Blood Pressure (Exit) 98/60   Heart Rate (Admit) 58 bpm   Heart Rate (Exercise) 83 bpm   Heart Rate (Exit) 59 bpm   Oxygen Saturation (Admit) 100 %   Oxygen Saturation (Exercise) 96 %   Oxygen Saturation (Exit) 98 %   Rating of Perceived Exertion (Exercise) 15   Perceived Dyspnea (Exercise) 1   Duration Continue with 45 min of aerobic exercise without signs/symptoms of physical distress.   Intensity THRR unchanged     Progression   Progression Continue to progress workloads to maintain intensity without signs/symptoms of physical distress.     Resistance Training   Training Prescription Yes   Weight orange bands   Reps 10-15   Time 10 Minutes     Oxygen   Oxygen Continuous   Liters 2.4     NuStep   Level 4   Minutes 17   METs 1.7     Track   Laps 4   Minutes 17      History  Smoking Status  . Never Smoker  Smokeless Tobacco  . Never Used    Goals Met:  Exercise tolerated well Strength training completed today  Goals Unmet:  Not Applicable  Comments: Service time is from 10:30a to 12:30p    Dr. Rush Farmer is Medical Director for Pulmonary Rehab at [facility].

## 2017-06-14 DIAGNOSIS — I5040 Unspecified combined systolic (congestive) and diastolic (congestive) heart failure: Secondary | ICD-10-CM | POA: Diagnosis not present

## 2017-06-14 DIAGNOSIS — R269 Unspecified abnormalities of gait and mobility: Secondary | ICD-10-CM | POA: Diagnosis not present

## 2017-06-14 DIAGNOSIS — J9610 Chronic respiratory failure, unspecified whether with hypoxia or hypercapnia: Secondary | ICD-10-CM | POA: Diagnosis not present

## 2017-06-15 ENCOUNTER — Encounter (HOSPITAL_COMMUNITY)
Admission: RE | Admit: 2017-06-15 | Discharge: 2017-06-15 | Disposition: A | Discharge status: Home / Self Care | Payer: PPO | Source: Ambulatory Visit | Attending: Pulmonary Disease | Admitting: Pulmonary Disease

## 2017-06-15 DIAGNOSIS — J9611 Chronic respiratory failure with hypoxia: Secondary | ICD-10-CM | POA: Diagnosis not present

## 2017-06-15 DIAGNOSIS — R269 Unspecified abnormalities of gait and mobility: Secondary | ICD-10-CM | POA: Diagnosis not present

## 2017-06-15 DIAGNOSIS — J9610 Chronic respiratory failure, unspecified whether with hypoxia or hypercapnia: Secondary | ICD-10-CM | POA: Diagnosis not present

## 2017-06-15 DIAGNOSIS — I5040 Unspecified combined systolic (congestive) and diastolic (congestive) heart failure: Secondary | ICD-10-CM | POA: Diagnosis not present

## 2017-06-20 ENCOUNTER — Telehealth (HOSPITAL_COMMUNITY): Payer: Self-pay | Admitting: Internal Medicine

## 2017-06-20 ENCOUNTER — Encounter (HOSPITAL_COMMUNITY): Payer: PPO

## 2017-06-20 DIAGNOSIS — M5136 Other intervertebral disc degeneration, lumbar region: Secondary | ICD-10-CM | POA: Diagnosis not present

## 2017-06-21 ENCOUNTER — Encounter (HOSPITAL_COMMUNITY): Payer: Self-pay | Admitting: Cardiology

## 2017-06-21 ENCOUNTER — Ambulatory Visit (HOSPITAL_COMMUNITY)
Admission: RE | Admit: 2017-06-21 | Discharge: 2017-06-21 | Disposition: A | Discharge status: Home / Self Care | Payer: PPO | Source: Ambulatory Visit | Attending: Cardiology | Admitting: Cardiology

## 2017-06-21 VITALS — BP 130/63 | HR 70 | Wt 221.2 lb

## 2017-06-21 DIAGNOSIS — I482 Chronic atrial fibrillation, unspecified: Secondary | ICD-10-CM

## 2017-06-21 DIAGNOSIS — J9611 Chronic respiratory failure with hypoxia: Secondary | ICD-10-CM | POA: Insufficient documentation

## 2017-06-21 DIAGNOSIS — G4733 Obstructive sleep apnea (adult) (pediatric): Secondary | ICD-10-CM | POA: Diagnosis not present

## 2017-06-21 DIAGNOSIS — Z8673 Personal history of transient ischemic attack (TIA), and cerebral infarction without residual deficits: Secondary | ICD-10-CM | POA: Diagnosis not present

## 2017-06-21 DIAGNOSIS — Z823 Family history of stroke: Secondary | ICD-10-CM | POA: Insufficient documentation

## 2017-06-21 DIAGNOSIS — Z79891 Long term (current) use of opiate analgesic: Secondary | ICD-10-CM | POA: Diagnosis not present

## 2017-06-21 DIAGNOSIS — Z833 Family history of diabetes mellitus: Secondary | ICD-10-CM | POA: Diagnosis not present

## 2017-06-21 DIAGNOSIS — I5032 Chronic diastolic (congestive) heart failure: Secondary | ICD-10-CM | POA: Insufficient documentation

## 2017-06-21 DIAGNOSIS — Z8249 Family history of ischemic heart disease and other diseases of the circulatory system: Secondary | ICD-10-CM | POA: Diagnosis not present

## 2017-06-21 DIAGNOSIS — Z7901 Long term (current) use of anticoagulants: Secondary | ICD-10-CM | POA: Diagnosis not present

## 2017-06-21 DIAGNOSIS — Z888 Allergy status to other drugs, medicaments and biological substances status: Secondary | ICD-10-CM | POA: Insufficient documentation

## 2017-06-21 DIAGNOSIS — I272 Pulmonary hypertension, unspecified: Secondary | ICD-10-CM | POA: Diagnosis not present

## 2017-06-21 DIAGNOSIS — I2721 Secondary pulmonary arterial hypertension: Secondary | ICD-10-CM | POA: Insufficient documentation

## 2017-06-21 LAB — BASIC METABOLIC PANEL
Anion gap: 10 (ref 5–15)
BUN: 35 mg/dL — AB (ref 6–20)
CHLORIDE: 108 mmol/L (ref 101–111)
CO2: 22 mmol/L (ref 22–32)
CREATININE: 1.24 mg/dL — AB (ref 0.44–1.00)
Calcium: 9.3 mg/dL (ref 8.9–10.3)
GFR calc Af Amer: 51 mL/min — ABNORMAL LOW (ref >60)
GFR calc non Af Amer: 44 mL/min — ABNORMAL LOW (ref >60)
GLUCOSE: 101 mg/dL — AB (ref 65–99)
POTASSIUM: 3.6 mmol/L (ref 3.5–5.1)
SODIUM: 140 mmol/L (ref 135–145)

## 2017-06-21 LAB — CBC
HEMATOCRIT: 34.5 % — AB (ref 36.0–46.0)
Hemoglobin: 10.9 g/dL — ABNORMAL LOW (ref 12.0–15.0)
MCH: 30 pg (ref 26.0–34.0)
MCHC: 31.6 g/dL (ref 30.0–36.0)
MCV: 95 fL (ref 78.0–100.0)
PLATELETS: 140 10*3/uL — AB (ref 150–400)
RBC: 3.63 MIL/uL — ABNORMAL LOW (ref 3.87–5.11)
RDW: 16.8 % — AB (ref 11.5–15.5)
WBC: 8.6 10*3/uL (ref 4.0–10.5)

## 2017-06-21 NOTE — Progress Notes (Signed)
Advanced Heart Failure Clinic Note   Primary Care: Dr. [name]  HF Cardiology: [name]  Pulm: Dr. Lake Bells  HPI:  [name] is a 66 y.o. 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  Admitted 3/2 ->0/35/00 with A/C diastolic CHF and A/C respiratory failure. Pt initially refused Bipap so venti mask used. Eventually tolerated transition to BiPAP. Overall pt diuresed from 285 lbs down to 229 lbs with 38 L of diuresis.  Echo 11/07/16 EF 55-60%, PASP 42m Hg, mildly dilated RV with moderate to severely decreased RV systolic function. RHC/LHC in 3/18 showed no coronary disease and confirmed severe PH.    She returns today for followup of diastolic CHF. Weight is down 1 lb. Denies dizziness and orthostatic symptoms. She is going to pulmonary rehab and feels like it is helping.  She continues to use O2 during the day and CPAP at night.  She is limited by low back pain, had an injection yesterday.  The back pain limits her walking.  She also gets short of breath walking about 100 yards.  No chest pain.  No orthopnea/PND.   No BRBPR/melena.   PMH 1. Chronic diastolic CHF with severe pulmonary hypertension and prominent RV failure:  Echo (3/18) with EF 55-60%, PASP 973mHg, mildly dilated RV with moderate to severely decreased RV systolic function. - RHC/LHC 11/07/16: No angiographic CAD; PA 90/55, LVEDP 23 (PCWP inaccurate), CI 2.15 Fick/2.32 Thermo, shunt run negative, PVR 5.7 WU.  2. Chronic hypercarbic/hypoxic respiratory failure with OHS/OSA. Sees Dr. McLake BellsUsing nightly BiPAP and oxygen by nasal cannula during the day.  3. Atrial fibrillation: Chronic - Rate controlled on Xarelto + atenolol  4. 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 PVR 5.7 WU on RHC 3/18. Suspect group 2 (elevated LA  pressure) and group 3 (OHS/OSA) PH. However, cannot rule out group 1 component. She had V/Q scan that was not suggestive of chronic PE and high resolution chest CT that was not suggestive of ILD. ANA, RF, anti-SCL70 all negative.   5. H/o CVA 6. Hypothyroidism 7. H/o TKR   Current Outpatient Prescriptions  Medication Sig Dispense Refill  . acetaZOLAMIDE (DIAMOX) 250 MG tablet Take 1 tablet (250 mg total) by mouth daily. 30 tablet 6  . allopurinol (ZYLOPRIM) 100 MG tablet Take 1 tablet (100 mg total) by mouth 2 (two) times daily. 90 tablet 3  . atenolol (TENORMIN) 50 MG tablet Take 50 mg by mouth daily. Take an extra 1/2 tab by mouth at bedtime    . colchicine 0.6 MG tablet Take 1 tablet (0.6 mg total) by mouth daily as needed (flareups). 60 tablet 1  . diclofenac sodium (VOLTAREN) 1 % GEL Apply 2 g topically 2 (two) times daily as needed (for arthritis).     . Marland Kitchenevothyroxine (SYNTHROID, LEVOTHROID) 75 MCG tablet Take 1 tablet (75 mcg total) by mouth daily. 90 tablet 2  . methocarbamol (ROBAXIN) 500 MG tablet Take 1/2 tablet by mouth as needed for muscle spasms. 30 tablet 0  . OXYGEN Inhale 2 L into the lungs continuous.     . potassium chloride (K-DUR) 10 MEQ tablet Take 20 mEq by mouth 2 (two) times daily.    . rivaroxaban (XARELTO) 20 MG TABS tablet Take 1 tablet (20 mg total) by mouth daily with supper. 90 tablet 3  .  tadalafil, PAH, (ADCIRCA) 20 MG tablet Take 2 tablets (40 mg total) by mouth daily. 60 tablet 11  . torsemide (DEMADEX) 20 MG tablet Take 3m (2 Tablets) in the AM and 241m(1 Tablet) in the PM. 90 tablet 6  . traMADol (ULTRAM) 50 MG tablet TAKE 1 TABLET BY MOUTH THREE TIMES A DAY AS NEEDED FOR PAIN 90 tablet 0   No current facility-administered medications for this encounter.     Allergies  Allergen Reactions  . Ambien [Zolpidem Tartrate] Other (See Comments)    Amnesia and fall   . Losartan Other (See Comments)    Elevated creatinine and swelling  . Prevacid  [Lansoprazole] Swelling  . Adhesive [Tape] Other (See Comments)    Adhesive tape and band aids " irritate, " causes blisters and pulls skin when removing. Okay to use paper tape  . Keflex [Cephalexin] Swelling    Swelling in ankles, feet  . Motrin [Ibuprofen] Other (See Comments)    ANKLE EDEMA   . Prilosec [Omeprazole] Other (See Comments)    Swelling in ankles.  . Ace Inhibitors Cough  . Benadryl [Diphenhydramine Hcl] Other (See Comments)    topical  . Spironolactone Rash      Social History   Social History  . Marital status: Divorced    Spouse name: N/A  . Number of children: N/A  . Years of education: N/A   Occupational History  . Not on file.   Social History Main Topics  . Smoking status: Never Smoker  . Smokeless tobacco: Never Used  . Alcohol use 0.6 oz/week    1 Glasses of wine per week     Comment: occ  . Drug use: No  . Sexual activity: No   Other Topics Concern  . 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 COPD Mother   . Hypertension Mother   . Osteoporosis Mother   . Diabetes Father   . Hypertension Father   . Liver cancer Father   . Heart attack Father   . Hypertension Sister   . Hypertension Brother   . Stroke Maternal Grandmother     Vitals:   06/21/17 0854  BP: 130/63  Pulse: 70  SpO2: 100%  Weight: 221 lb 4 oz (100.4 kg)   Wt Readings from Last 3 Encounters:  06/21/17 221 lb 4 oz (100.4 kg)  06/13/17 220 lb 3.8 oz (99.9 kg)  06/08/17 217 lb 2.5 oz (98.5 kg)   PHYSICAL EXAM: General: NAD Neck: JVP 7-8 cm, no thyromegaly or thyroid nodule.  Lungs: Clear to auscultation bilaterally with normal respiratory effort. CV: Nondisplaced PMI.  Heart irregular S1/S2, no S3/S4, no murmur.  No peripheral edema.  No carotid bruit.  Normal pedal pulses.  Abdomen: Soft, nontender,  no hepatosplenomegaly, no distention.  Skin: Intact without lesions or rashes.  Neurologic: Alert and oriented x 3.  Psych: Normal affect. Extremities: No clubbing or cyanosis.  HEENT: Normal.   ASSESSMENT & PLAN: 1. Chronic diastolic CHF:  Associated with severe pulmonary hypertension and prominent RV failure.  NYHA class III symptoms, stable.  Confounded by limitation from low back pain.  She does not look volume overloaded on exam.  - Continue torsemide 4075mn the am and  20 mg in the pm.  - Continue diamox 250 mg daily.  - Continue KCl 20 mEq BID. Will check BMET today.  2. Chronic hypercarbic/hypoxic respiratory failure with OHS/OSA: Sees Dr. Lake Bells.   - Continue continuous O2 at 2L.  - compliant with Bipap nightly.  - Continue pulmonary rehab.  3. Atrial fibrillation: Chronic, rate controlled  - Continue atenolol 50 qam/25 qpm.   - Continue Xarelto for anticoagulation.  Check CBC today.  4. 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 PVR 5.7 WU by [place] in 3/18. Suspect group 2 (elevated LA pressure) and group 3 (OHS/OSA) PH. However, cannot rule out group 1 component. She had V/Q scan that was not suggestive of chronic PE and high resolution chest CT that was not suggestive of ILD. ANA, RF, anti-SCL70 all negative.  She has not had a marked improvement from taking Adcirca => suspect predominantly group 2 and 3 PH.  Will hold off on additional pulmonary vasodilators.  - Continue Adcirca 40 mg daily.  - Continue 02 at 2 L and BiPAP at night for OHS/OSA.  - Will hold off on 6 minute walk today as she did not bring her rollator.  Will do next appointment.   Loralie Champagne 06/21/2017

## 2017-06-21 NOTE — Patient Instructions (Signed)
Labs drawn today (if we do not call you, then your lab work was stable)   Your physician recommends that you schedule a follow-up appointment in: 3 months 

## 2017-06-22 ENCOUNTER — Encounter (HOSPITAL_COMMUNITY)
Admission: RE | Admit: 2017-06-22 | Discharge: 2017-06-22 | Disposition: A | Discharge status: Home / Self Care | Payer: PPO | Source: Ambulatory Visit | Attending: Pulmonary Disease | Admitting: Pulmonary Disease

## 2017-06-22 VITALS — Wt 222.4 lb

## 2017-06-22 DIAGNOSIS — J9611 Chronic respiratory failure with hypoxia: Secondary | ICD-10-CM

## 2017-06-22 NOTE — Progress Notes (Signed)
Daily Session Note  Patient Details  Name: [name] MRN: [id number] Date of Birth: 11-Apr-1951 Referring Provider:     Pulmonary Rehab Walk Test from 04/04/2017 in [place]  Referring Provider  Dr. Lake Bells      Encounter Date: 06/22/2017  Check In:     Session Check In - 06/22/17 1013      Check-In   Location [facility]   Staff Present Trish Fountain, RN, BSN;Deran Barro Ysidro Evert, RN;Molly diVincenzo, MS, ACSM RCEP, Exercise Physiologist   Supervising physician immediately available to respond to emergencies [facility] immediately available   Physician(s) Dr. Horris Latino   Medication changes reported     No   Fall or balance concerns reported    No   Tobacco Cessation No Change   Warm-up and Cool-down Performed as group-led instruction   VAD Patient? No     Pain Assessment   Currently in Pain? No/denies   Multiple Pain Sites No      Capillary Blood Glucose: No results found for this or any previous visit (from the past 24 hour(s)).      Exercise Prescription Changes - 06/22/17 1200      Response to Exercise   Blood Pressure (Admit) 104/56   Blood Pressure (Exercise) 128/68   Blood Pressure (Exit) 106/54   Heart Rate (Admit) 59 bpm   Heart Rate (Exercise) 104 bpm   Heart Rate (Exit) 72 bpm   Oxygen Saturation (Admit) 100 %   Oxygen Saturation (Exercise) 95 %   Oxygen Saturation (Exit) 98 %   Rating of Perceived Exertion (Exercise) 14   Perceived Dyspnea (Exercise) 1   Duration Continue with 45 min of aerobic exercise without signs/symptoms of physical distress.   Intensity THRR unchanged     Progression   Progression Continue to progress workloads to maintain intensity without signs/symptoms of physical distress.     Resistance Training   Training Prescription Yes   Weight orange bands   Reps 10-15   Time 10 Minutes     Oxygen   Oxygen Continuous   Liters 2.4     NuStep   Level 5   Minutes 17   METs 1.6     Track   Laps 4   Minutes 17      History  Smoking Status  . Never Smoker  Smokeless Tobacco  . Never Used    Goals Met:  Exercise tolerated well No report of cardiac concerns or symptoms Strength training completed today  Goals Unmet:  Not Applicable  Comments: Service time is from 1030 to 1235    Dr. Rush Farmer is Medical Director for Pulmonary Rehab at [facility].

## 2017-06-23 DIAGNOSIS — R269 Unspecified abnormalities of gait and mobility: Secondary | ICD-10-CM | POA: Diagnosis not present

## 2017-06-23 DIAGNOSIS — G4733 Obstructive sleep apnea (adult) (pediatric): Secondary | ICD-10-CM | POA: Diagnosis not present

## 2017-06-23 DIAGNOSIS — I5040 Unspecified combined systolic (congestive) and diastolic (congestive) heart failure: Secondary | ICD-10-CM | POA: Diagnosis not present

## 2017-06-23 DIAGNOSIS — J9610 Chronic respiratory failure, unspecified whether with hypoxia or hypercapnia: Secondary | ICD-10-CM | POA: Diagnosis not present

## 2017-06-26 ENCOUNTER — Other Ambulatory Visit: Payer: Self-pay | Admitting: Internal Medicine

## 2017-06-26 NOTE — Progress Notes (Signed)
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 03/27/17 1318      Pre Biometrics   Grip Strength 16  kg       Nutrition Therapy Plan and Nutrition Goals:     Nutrition Therapy & Goals - 05/01/17 1500      Nutrition Therapy   Diet TLC     Personal Nutrition Goals   Nutrition Goal Identify food quantities necessary to achieve wt loss of 1-2 lb per week to a goal wt loss of 6-24 lb at graduation from pulmonary rehab.   Personal Goal #2 Pt to be able to name foods that affect blood glucose. Pt's A1c was 6.1 01/25/16.     Intervention Plan   Intervention Prescribe, educate and counsel regarding individualized specific dietary modifications aiming towards targeted core components such as weight, hypertension, lipid management, diabetes, heart failure and other comorbidities.   Expected Outcomes Short Term Goal: Understand basic principles of dietary content, such as calories, fat, sodium, cholesterol and nutrients.;Long Term Goal: Adherence to prescribed nutrition plan.      Nutrition Discharge: Rate Your Plate Scores:     Nutrition Assessments - 05/01/17 1455      Rate Your Plate Scores   Pre Score 57      Nutrition Goals Re-Evaluation:   Nutrition Goals Discharge (Final Nutrition Goals Re-Evaluation):   Psychosocial: Target Goals: Acknowledge presence or absence of significant depression and/or stress, maximize coping skills, provide positive support system. Participant is able to verbalize types and ability to use techniques and skills needed for reducing stress and depression.  Initial Review & Psychosocial Screening:     Initial Psych Review & Screening - 03/27/17 1325      Initial Review   Current issues with None Identified     Family Dynamics   Good Support System? Yes     Barriers   Psychosocial barriers to participate in program There are no identifiable barriers or psychosocial needs.     Screening Interventions   Interventions Encouraged to exercise      Quality of Life Scores:   PHQ-9: Recent Review Flowsheet Data    [facility]  03/27/2017 12/27/2016 04/22/2015 01/20/2014   Decreased Interest 0 1 0 0   Down, Depressed, Hopeless 0 1 1 0   PHQ - 2 Score 0 2 1 0     Interpretation of Total Score  Total Score Depression Severity:  1-4 = Minimal depression, 5-9 = Mild depression, 10-14 = Moderate depression, 15-19 = Moderately severe depression, 20-27 = Severe depression   Psychosocial Evaluation and Intervention:     Psychosocial Evaluation - 03/27/17 1326      Psychosocial Evaluation & Interventions   Interventions Encouraged to exercise with the program and follow exercise prescription   Continue Psychosocial Services  No Follow up required      Psychosocial Re-Evaluation:     Psychosocial Re-Evaluation    RoFerroname 05/03/17 1801 05/26/17 1159 06/25/17 2110         Psychosocial Re-Evaluation   Current issues with None Identified None Identified None Identified     Comments  -  -  patient continues to remain free from psychosocial barriers     Expected Outcomes patient will remain free from psychosocial barriers to participation in pulmonary rehab patient will remain free from psychosocial barriers to participation in pulmonary rehab patient will remain free from psychosocial barriers to participation in pulmonary rehab     Interventions Encouraged to attend Pulmonary Rehabilitation for the exercise Encouraged to attend Pulmonary Rehabilitation for the exercise Encouraged to attend Pulmonary Rehabilitation for the exercise     Continue Psychosocial Services  No Follow up required No Follow up required No Follow up required        Psychosocial Discharge (Final Psychosocial Re-Evaluation):     Psychosocial Re-Evaluation - 06/25/17 2110      Psychosocial Re-Evaluation   Current issues with None Identified   Comments patient continues to remain free from psychosocial barriers   Expected Outcomes patient will remain free from psychosocial barriers to participation in pulmonary rehab   Interventions  Encouraged to attend Pulmonary Rehabilitation for the exercise   Continue Psychosocial Services  No Follow up required      Education: Education Goals: Education classes will be provided on a weekly basis, covering required topics. Participant will state understanding/return demonstration of topics presented.  Learning Barriers/Preferences:   Education Topics: Risk Factor Reduction:  -Group instruction that is supported by a PowerPoint presentation. Instructor discusses the definition of a risk factor, different risk factors for pulmonary disease, and how the heart and lungs work together.     Nutrition for Pulmonary Patient:  -Group instruction provided by PowerPoint slides, verbal discussion, and written materials to support subject matter. The instructor gives an explanation and review of healthy diet recommendations, which includes a discussion on weight management, recommendations for fruit and vegetable consumption, as well as protein, fluid, caffeine, fiber, sodium, sugar, and alcohol. Tips for eating when patients are short of breath are discussed.   PULMONARY REHAB OTHER RESPIRATORY from 06/22/2017 in [place]  Date  06/22/17  Educator  Nuritionist  Instruction Review Code  R- Review/reinforce      Pursed Lip Breathing:  -Group instruction that is supported by demonstration and informational handouts. Instructor discusses the benefits of pursed lip and diaphragmatic breathing and detailed demonstration on how to preform both.     Oxygen Safety:  -Group instruction provided by PowerPoint, verbal discussion, and written material to support subject matter. There is an overview of "What is Oxygen" and "Why do we need it".  Instructor also reviews how to create a safe environment for oxygen use, the importance of using oxygen as prescribed, and the risks of noncompliance. There is a brief discussion on traveling with oxygen and resources the patient  may utilize.   PULMONARY REHAB OTHER RESPIRATORY from 06/22/2017 in [place]  Date  04/13/17  Educator  Truddie Crumble  Instruction Review Code  2- meets goals/outcomes      Oxygen Equipment:  -Group instruction provided by [employer] Staff utilizing handouts, written materials, and equipment demonstrations.   Signs and Symptoms:  -Group instruction provided by written material and verbal discussion to support subject matter. Warning signs and symptoms of infection, stroke, and heart attack are reviewed and when to call the physician/911 reinforced. Tips for preventing the spread of infection discussed.   PULMONARY REHAB OTHER RESPIRATORY from 06/22/2017 in [place]  Date  05/25/17  Educator  RN  Instruction Review Code  2- meets goals/outcomes  Advanced Directives:  -Group instruction provided by verbal instruction and written material to support subject matter. Instructor reviews Advanced Directive laws and proper instruction for filling out document.   Pulmonary Video:  -Group video education that reviews the importance of medication and oxygen compliance, exercise, good nutrition, pulmonary hygiene, and pursed lip and diaphragmatic breathing for the pulmonary patient.   PULMONARY REHAB OTHER RESPIRATORY from 06/22/2017 in [place]  Date  06/01/17  Instruction Review Code  2- meets goals/outcomes      Exercise for the Pulmonary Patient:  -Group instruction that is supported by a PowerPoint presentation. Instructor discusses benefits of exercise, core components of exercise, frequency, duration, and intensity of an exercise routine, importance of utilizing pulse oximetry during exercise, safety while exercising, and options of places to exercise outside of rehab.     PULMONARY REHAB OTHER RESPIRATORY from 06/22/2017 in [place]  Date  05/16/17   Educator  ep  Instruction Review Code  2- meets goals/outcomes      Pulmonary Medications:  -Verbally interactive group education provided by instructor with focus on inhaled medications and proper administration.   PULMONARY REHAB OTHER RESPIRATORY from 06/22/2017 in [place]  Date  05/04/17  Educator  pharmacist  Instruction Review Code  2- meets goals/outcomes      Anatomy and Physiology of the Respiratory System and Intimacy:  -Group instruction provided by PowerPoint, verbal discussion, and written material to support subject matter. Instructor reviews respiratory cycle and anatomical components of the respiratory system and their functions. Instructor also reviews differences in obstructive and restrictive respiratory diseases with examples of each. Intimacy, Sex, and Sexuality differences are reviewed with a discussion on how relationships can change when diagnosed with pulmonary disease. Common sexual concerns are reviewed.   MD DAY -A group question and answer session with a medical doctor that allows participants to ask questions that relate to their pulmonary disease state.   PULMONARY REHAB OTHER RESPIRATORY from 06/22/2017 in [place]  Date  06/13/17  Educator  Nelda Marseille  Instruction Review Code  2- meets goals/outcomes      OTHER EDUCATION -Group or individual verbal, written, or video instructions that support the educational goals of the pulmonary rehab program.   Knowledge Questionnaire Score:     Knowledge Questionnaire Score - 04/04/17 0914      Knowledge Questionnaire Score   Pre Score 13/13      Core Components/Risk Factors/Patient Goals at Admission:     Personal Goals and Risk Factors at Admission - 03/27/17 1324      Core Components/Risk Factors/Patient Goals on Admission   Improve shortness of breath with ADL's Yes   Intervention Provide education, individualized exercise plan  and daily activity instruction to help decrease symptoms of SOB with activities of daily living.   Expected Outcomes Short Term: Achieves a reduction of symptoms when performing activities of daily living.   Develop more efficient breathing techniques such as purse lipped breathing and diaphragmatic breathing; and practicing self-pacing with activity Yes   Intervention Provide education, demonstration and support about specific breathing techniuqes utilized for more efficient breathing. Include techniques such as pursed lipped breathing, diaphragmatic breathing and self-pacing activity.   Expected Outcomes Short Term: Participant will be able to demonstrate and use breathing techniques as needed throughout daily activities.   Increase knowledge of respiratory medications and ability to use respiratory devices properly  Yes   Intervention  Provide education and demonstration as needed of appropriate use of medications, inhalers, and oxygen therapy.   Expected Outcomes Short Term: Achieves understanding of medications use. Understands that oxygen is a medication prescribed by physician. Demonstrates appropriate use of inhaler and oxygen therapy.   Heart Failure Yes   Intervention Provide a combined exercise and nutrition program that is supplemented with education, support and counseling about heart failure. Directed toward relieving symptoms such as shortness of breath, decreased exercise tolerance, and extremity edema.      Core Components/Risk Factors/Patient Goals Review:      Goals and Risk Factor Review    Row Name 05/03/17 1757 05/26/17 1158 06/25/17 2108         Core Components/Risk Factors/Patient Goals Review   Personal Goals Review Develop more efficient breathing techniques such as purse lipped breathing and diaphragmatic breathing and practicing self-pacing with activity.;Improve shortness of breath with ADL's;Increase knowledge of respiratory medications and ability to use  respiratory devices properly.;Heart Failure Develop more efficient breathing techniques such as purse lipped breathing and diaphragmatic breathing and practicing self-pacing with activity.;Improve shortness of breath with ADL's;Increase knowledge of respiratory medications and ability to use respiratory devices properly.;Heart Failure Develop more efficient breathing techniques such as purse lipped breathing and diaphragmatic breathing and practicing self-pacing with activity.;Improve shortness of breath with ADL's;Increase knowledge of respiratory medications and ability to use respiratory devices properly.;Heart Failure     Review patient is doing well in pulmonary rehab however her progress is slow. she demonstrates the pursed lip breathing technique independently. she is limited to progression related to her back issues. she is in the process of scheduling injections for back pain control and is hopeful that after the injections she will be able to tolerate more physical activity at rehab and at home. patient is doing well with seated exercises however her effort to walk is extremely poor. she is limited by back pain and injections have not helped the way she was hoping. will continue to monitor patient continues to do well with seated exercise. She recieved back injections this past week however at her follow-up exercise session she stated she did not see much of an improvement in her pain with walking     Expected Outcomes see admission expected outcomes see admission expected outcomes see admission expected outcomes        Core Components/Risk Factors/Patient Goals at Discharge (Final Review):      Goals and Risk Factor Review - 06/25/17 2108      Core Components/Risk Factors/Patient Goals Review   Personal Goals Review Develop more efficient breathing techniques such as purse lipped breathing and diaphragmatic breathing and practicing self-pacing with activity.;Improve shortness of breath with  ADL's;Increase knowledge of respiratory medications and ability to use respiratory devices properly.;Heart Failure   Review patient continues to do well with seated exercise. She recieved back injections this past week however at her follow-up exercise session she stated she did not see much of an improvement in her pain with walking   Expected Outcomes see admission expected outcomes      ITP Comments:   Comments: ITP REVIEW Pt is slow progress toward pulmonary rehab goals after completing 19 sessions. She will be extended to 36 session to give her adequate time to attempt to maximize her physical activity. Recommend continued exercise, life style modification, education, and utilization of breathing techniques to increase stamina and strength and decrease shortness of breath with exertion.

## 2017-06-27 ENCOUNTER — Encounter (HOSPITAL_COMMUNITY)
Admission: RE | Admit: 2017-06-27 | Discharge: 2017-06-27 | Disposition: A | Discharge status: Home / Self Care | Payer: PPO | Source: Ambulatory Visit | Attending: Pulmonary Disease | Admitting: Pulmonary Disease

## 2017-06-27 ENCOUNTER — Telehealth (HOSPITAL_COMMUNITY): Payer: Self-pay | Admitting: Internal Medicine

## 2017-06-27 DIAGNOSIS — J9611 Chronic respiratory failure with hypoxia: Secondary | ICD-10-CM

## 2017-06-28 DIAGNOSIS — M19022 Primary osteoarthritis, left elbow: Secondary | ICD-10-CM | POA: Diagnosis not present

## 2017-06-28 DIAGNOSIS — J9610 Chronic respiratory failure, unspecified whether with hypoxia or hypercapnia: Secondary | ICD-10-CM | POA: Diagnosis not present

## 2017-06-28 DIAGNOSIS — G4733 Obstructive sleep apnea (adult) (pediatric): Secondary | ICD-10-CM | POA: Diagnosis not present

## 2017-06-28 DIAGNOSIS — G5601 Carpal tunnel syndrome, right upper limb: Secondary | ICD-10-CM | POA: Diagnosis not present

## 2017-06-28 DIAGNOSIS — R269 Unspecified abnormalities of gait and mobility: Secondary | ICD-10-CM | POA: Diagnosis not present

## 2017-06-28 DIAGNOSIS — I5040 Unspecified combined systolic (congestive) and diastolic (congestive) heart failure: Secondary | ICD-10-CM | POA: Diagnosis not present

## 2017-06-28 NOTE — Telephone Encounter (Signed)
Can refill each x 1

## 2017-06-28 NOTE — Telephone Encounter (Signed)
Refill request for Medication: Methocarbamol 565m Last Filled: 04/26/17, #30  Medication: Tramadol 522mLast Filled: 05/02/17, #90  Previous / Upcoming Appt: 08/08/17  Please advise Dr PaRegis Billthanks.

## 2017-06-28 NOTE — Telephone Encounter (Signed)
Pharmacy calling to follow up on refill request. They would like to get today, because they only have 30 left of the robaxin, after that, this med is backordered

## 2017-06-29 ENCOUNTER — Other Ambulatory Visit: Payer: Self-pay | Admitting: Cardiovascular Disease

## 2017-06-29 ENCOUNTER — Encounter (HOSPITAL_COMMUNITY)
Admission: RE | Admit: 2017-06-29 | Discharge: 2017-06-29 | Disposition: A | Discharge status: Home / Self Care | Payer: PPO | Source: Ambulatory Visit | Attending: Pulmonary Disease | Admitting: Pulmonary Disease

## 2017-06-29 VITALS — Wt 219.6 lb

## 2017-06-29 DIAGNOSIS — R945 Abnormal results of liver function studies: Secondary | ICD-10-CM

## 2017-06-29 DIAGNOSIS — R600 Localized edema: Secondary | ICD-10-CM

## 2017-06-29 DIAGNOSIS — J9611 Chronic respiratory failure with hypoxia: Secondary | ICD-10-CM

## 2017-06-29 DIAGNOSIS — I482 Chronic atrial fibrillation, unspecified: Secondary | ICD-10-CM

## 2017-06-29 DIAGNOSIS — R0600 Dyspnea, unspecified: Secondary | ICD-10-CM

## 2017-06-29 DIAGNOSIS — R7989 Other specified abnormal findings of blood chemistry: Secondary | ICD-10-CM

## 2017-06-29 DIAGNOSIS — I10 Essential (primary) hypertension: Secondary | ICD-10-CM

## 2017-06-29 DIAGNOSIS — Z7901 Long term (current) use of anticoagulants: Secondary | ICD-10-CM

## 2017-06-29 NOTE — Telephone Encounter (Signed)
Refills called into [facility]. Nothing further needed.

## 2017-06-29 NOTE — Progress Notes (Signed)
Daily Session Note  Patient Details  Name: [name] MRN: [id number] Date of Birth: [birth year] Referring Provider:     Pulmonary Rehab Walk Test from 04/04/2017 in [place]  Referring Provider  Dr. Lake Bells      Encounter Date: 06/29/2017  Check In:     Session Check In - 06/29/17 1058      Check-In   Location [facility]   Staff Present Rodney Langton, RN;Eran Windish Rollene Rotunda, RN, BSN;Molly diVincenzo, MS, ACSM RCEP, Exercise Physiologist;Joan Leonia Reeves, RN, BSN   Supervising physician immediately available to respond to emergencies [facility] immediately available   Physician(s) Dr. Wendee Beavers   Medication changes reported     No   Fall or balance concerns reported    No   Tobacco Cessation No Change   Warm-up and Cool-down Performed as group-led instruction   Resistance Training Performed Yes   VAD Patient? No     Pain Assessment   Currently in Pain? No/denies   Multiple Pain Sites No      Capillary Blood Glucose: No results found for this or any previous visit (from the past 24 hour(s)).      Exercise Prescription Changes - 06/29/17 1250      Response to Exercise   Blood Pressure (Admit) 106/60   Blood Pressure (Exercise) 132/60   Blood Pressure (Exit) 116/70   Heart Rate (Admit) 76 bpm   Heart Rate (Exercise) 118 bpm   Heart Rate (Exit) 70 bpm   Oxygen Saturation (Admit) 96 %   Oxygen Saturation (Exercise) 95 %   Oxygen Saturation (Exit) 97 %   Rating of Perceived Exertion (Exercise) 14   Perceived Dyspnea (Exercise) 1   Duration Continue with 45 min of aerobic exercise without signs/symptoms of physical distress.   Intensity THRR unchanged     Progression   Progression Continue to progress workloads to maintain intensity without signs/symptoms of physical distress.     Resistance Training   Training Prescription Yes   Weight orange bands   Reps 10-15   Time 10 Minutes     Oxygen   Oxygen Continuous   Liters 2-4     NuStep   Level 5   Minutes 17   METs 1.6     Track   Laps 7   Minutes 17      History  Smoking Status  . Never Smoker  Smokeless Tobacco  . Never Used    Goals Met:  Independence with exercise equipment Improved SOB with ADL's Using PLB without cueing & demonstrates good technique Exercise tolerated well No report of cardiac concerns or symptoms Strength training completed today  Goals Unmet:  Not Applicable  Comments: [id number] time is from 1030 to 1230   Dr. Rush Farmer is Medical Director for Pulmonary Rehab at [facility].

## 2017-07-03 ENCOUNTER — Other Ambulatory Visit (HOSPITAL_COMMUNITY): Payer: Self-pay | Admitting: *Deleted

## 2017-07-03 ENCOUNTER — Other Ambulatory Visit: Payer: Self-pay | Admitting: Cardiovascular Disease

## 2017-07-03 MED ORDER — POTASSIUM CHLORIDE ER 10 MEQ PO TBCR: 20.0000 meq | EXTENDED_RELEASE_TABLET | Freq: Two times a day (BID) | ORAL | 3 refills | Status: DC

## 2017-07-04 ENCOUNTER — Encounter (HOSPITAL_COMMUNITY)
Admission: RE | Admit: 2017-07-04 | Discharge: 2017-07-04 | Disposition: A | Discharge status: Home / Self Care | Payer: PPO | Source: Ambulatory Visit | Attending: Pulmonary Disease | Admitting: Pulmonary Disease

## 2017-07-04 VITALS — Wt 218.0 lb

## 2017-07-04 DIAGNOSIS — J9610 Chronic respiratory failure, unspecified whether with hypoxia or hypercapnia: Secondary | ICD-10-CM | POA: Diagnosis not present

## 2017-07-04 DIAGNOSIS — J9611 Chronic respiratory failure with hypoxia: Secondary | ICD-10-CM

## 2017-07-04 DIAGNOSIS — I5040 Unspecified combined systolic (congestive) and diastolic (congestive) heart failure: Secondary | ICD-10-CM | POA: Diagnosis not present

## 2017-07-04 DIAGNOSIS — R269 Unspecified abnormalities of gait and mobility: Secondary | ICD-10-CM | POA: Diagnosis not present

## 2017-07-04 NOTE — Progress Notes (Signed)
Daily Session Note  Patient Details  Name: [name] MRN: [id number] Date of Birth: 09/05/1951 Referring Provider:     Pulmonary Rehab Walk Test from 04/04/2017 in [place]  Referring Provider  Dr. Lake Bells      Encounter Date: 07/04/2017  Check In:     Session Check In - 07/04/17 1009      Check-In   Location [facility]   Staff Present Rosebud Poles, RN, BSN;Molly diVincenzo, MS, ACSM RCEP, Exercise Physiologist;Gisel Vipond Ysidro Evert, RN;Portia Rollene Rotunda, RN, BSN   Supervising physician immediately available to respond to emergencies [facility] immediately available   Physician(s) Dr. Wendee Beavers   Medication changes reported     No   Fall or balance concerns reported    No   Tobacco Cessation No Change   Warm-up and Cool-down Performed as group-led instruction   Resistance Training Performed Yes   VAD Patient? No     Pain Assessment   Currently in Pain? No/denies      Capillary Blood Glucose: No results found for this or any previous visit (from the past 24 hour(s)).      Exercise Prescription Changes - 07/04/17 1200      Response to Exercise   Blood Pressure (Admit) 118/64   Blood Pressure (Exercise) 112/60   Blood Pressure (Exit) 100/60   Heart Rate (Admit) 68 bpm   Heart Rate (Exercise) 84 bpm   Heart Rate (Exit) 72 bpm   Oxygen Saturation (Admit) 100 %   Oxygen Saturation (Exercise) 98 %   Oxygen Saturation (Exit) 97 %   Rating of Perceived Exertion (Exercise) 15   Perceived Dyspnea (Exercise) 1   Duration Continue with 45 min of aerobic exercise without signs/symptoms of physical distress.   Intensity THRR unchanged     Progression   Progression Continue to progress workloads to maintain intensity without signs/symptoms of physical distress.     Resistance Training   Training Prescription Yes   Weight orange bands   Reps 10-15   Time 10 Minutes     Oxygen   Oxygen Continuous   Liters 2.4     NuStep   Level 5   Minutes 17   METs 1.5     Track   Laps 7   Minutes 17      History  Smoking Status  . Never Smoker  Smokeless Tobacco  . Never Used    Goals Met:  Exercise tolerated well No report of cardiac concerns or symptoms Strength training completed today  Goals Unmet:  Not Applicable  Comments: Service time is from 1030 to 1200    Dr. Rush Farmer is Medical Director for Pulmonary Rehab at [facility].

## 2017-07-05 ENCOUNTER — Other Ambulatory Visit (HOSPITAL_COMMUNITY): Payer: Self-pay | Admitting: *Deleted

## 2017-07-05 DIAGNOSIS — R269 Unspecified abnormalities of gait and mobility: Secondary | ICD-10-CM | POA: Diagnosis not present

## 2017-07-05 DIAGNOSIS — I5040 Unspecified combined systolic (congestive) and diastolic (congestive) heart failure: Secondary | ICD-10-CM | POA: Diagnosis not present

## 2017-07-05 DIAGNOSIS — G4733 Obstructive sleep apnea (adult) (pediatric): Secondary | ICD-10-CM | POA: Diagnosis not present

## 2017-07-05 DIAGNOSIS — J9610 Chronic respiratory failure, unspecified whether with hypoxia or hypercapnia: Secondary | ICD-10-CM | POA: Diagnosis not present

## 2017-07-06 ENCOUNTER — Encounter (HOSPITAL_COMMUNITY)
Admission: RE | Admit: 2017-07-06 | Discharge: 2017-07-06 | Disposition: A | Discharge status: Home / Self Care | Payer: PPO | Source: Ambulatory Visit | Attending: Pulmonary Disease | Admitting: Pulmonary Disease

## 2017-07-06 VITALS — Wt 219.4 lb

## 2017-07-06 DIAGNOSIS — J9611 Chronic respiratory failure with hypoxia: Secondary | ICD-10-CM | POA: Diagnosis not present

## 2017-07-06 NOTE — Progress Notes (Signed)
I have reviewed a Home Exercise Prescription with Conley Rolls . [name] is not currently exercising at home.  [name] was advised to walk 2 days a week for 30 minutes.  [place] and I discussed how to progress their exercise prescription.  [name] stated that their goals were to get off daytime oxygen, increase walking endurance, increase energy, increase strength, and lose 25-50lbs.  [name] stated that they understand Stacy exercise prescription.  We reviewed exercise guidelines, target heart rate during exercise, oxygen use, weather, home pulse oximeter, endpoints for exercise, and goals.  [name] is encouraged to come to me with any questions. I will continue to follow up with [name] to assist them with progression and safety.  \

## 2017-07-06 NOTE — Progress Notes (Signed)
Daily Session Note  Patient Details  Name: [name] MRN: [id number] Date of Birth: Nov 29, 1950 Referring Provider:     Pulmonary Rehab Walk Test from 04/04/2017 in [place]  Referring Provider  Dr. Lake Bells      Encounter Date: 07/06/2017  Check In:     Session Check In - 07/06/17 1013      Check-In   Location [facility]   Staff Present Rosebud Poles, RN, BSN;Molly diVincenzo, MS, ACSM RCEP, Exercise Physiologist;Lisa Ysidro Evert, RN;Brandyn Lowrey Rollene Rotunda, RN, BSN   Supervising physician immediately available to respond to emergencies [facility] immediately available   Physician(s) Dr. Broadus John   Medication changes reported     No   Fall or balance concerns reported    No   Tobacco Cessation No Change   Warm-up and Cool-down Performed as group-led instruction   Resistance Training Performed Yes   VAD Patient? No     Pain Assessment   Currently in Pain? No/denies   Multiple Pain Sites No      Capillary Blood Glucose: No results found for this or any previous visit (from the past 24 hour(s)).      Exercise Prescription Changes - 07/06/17 1243      Response to Exercise   Blood Pressure (Admit) 90/40   Blood Pressure (Exercise) 110/50   Blood Pressure (Exit) 105/60   Heart Rate (Admit) 59 bpm   Heart Rate (Exercise) 114 bpm   Heart Rate (Exit) 95 bpm   Oxygen Saturation (Admit) 100 %   Oxygen Saturation (Exercise) 94 %   Oxygen Saturation (Exit) 98 %   Rating of Perceived Exertion (Exercise) 15   Perceived Dyspnea (Exercise) 3   Duration Continue with 45 min of aerobic exercise without signs/symptoms of physical distress.   Intensity THRR unchanged     Progression   Progression Continue to progress workloads to maintain intensity without signs/symptoms of physical distress.     Resistance Training   Training Prescription Yes   Weight orange bands   Reps 10-15   Time 10 Minutes     Oxygen   Oxygen Continuous   Liters 2.4     NuStep   Level 5   Minutes 17   METs 1.2     Track   Laps 7   Minutes 17      History  Smoking Status  . Never Smoker  Smokeless Tobacco  . Never Used    Goals Met:  Independence with exercise equipment Improved SOB with ADL's Using PLB without cueing & demonstrates good technique Exercise tolerated well No report of cardiac concerns or symptoms Strength training completed today  Goals Unmet:  Not Applicable  Comments: Service time is from 1030 to 1230   Dr. Rush Farmer is Medical Director for Pulmonary Rehab at [facility].

## 2017-07-07 ENCOUNTER — Other Ambulatory Visit (HOSPITAL_COMMUNITY): Payer: Self-pay | Admitting: *Deleted

## 2017-07-07 MED ORDER — ACETAZOLAMIDE 250 MG PO TABS: 250.0000 mg | ORAL_TABLET | Freq: Every day | ORAL | 2 refills | Status: DC

## 2017-07-11 ENCOUNTER — Encounter (HOSPITAL_COMMUNITY)
Admission: RE | Admit: 2017-07-11 | Discharge: 2017-07-11 | Disposition: A | Discharge status: Home / Self Care | Payer: PPO | Source: Ambulatory Visit | Attending: Pulmonary Disease | Admitting: Pulmonary Disease

## 2017-07-11 VITALS — Wt 220.5 lb

## 2017-07-11 DIAGNOSIS — J9611 Chronic respiratory failure with hypoxia: Secondary | ICD-10-CM | POA: Diagnosis not present

## 2017-07-11 NOTE — Progress Notes (Signed)
Daily Session Note  Patient Details  Name: [name] MRN: [id number] Date of Birth: Dec 01, 1950 Referring Provider:     Pulmonary Rehab Walk Test from 04/04/2017 in [place]  Referring Provider  Dr. Lake Bells      Encounter Date: 07/11/2017  Check In: Session Check In - 07/11/17 1017      Check-In   Location  [facility]    Staff Present  Su Hilt, MS, ACSM RCEP, Exercise Physiologist;Lisa Ysidro Evert, RN;Portia Ruby, RN, Maxcine Ham, RN, BSN    Supervising physician immediately available to respond to emergencies  [facility] immediately available    Physician(s)  Dr. Broadus John    Medication changes reported      No    Fall or balance concerns reported     No    Tobacco Cessation  No Change    Warm-up and Cool-down  Performed as group-led instruction    Resistance Training Performed  Yes    VAD Patient?  No      Pain Assessment   Currently in Pain?  No/denies    Multiple Pain Sites  No       Capillary Blood Glucose: No results found for this or any previous visit (from the past 24 hour(s)).  Exercise Prescription Changes - 07/11/17 1200      Response to Exercise   Blood Pressure (Admit)  100/50    Blood Pressure (Exercise)  98/50    Blood Pressure (Exit)  100/60    Heart Rate (Admit)  63 bpm    Heart Rate (Exercise)  111 bpm    Heart Rate (Exit)  66 bpm    Oxygen Saturation (Admit)  100 %    Oxygen Saturation (Exercise)  94 %    Oxygen Saturation (Exit)  93 %    Rating of Perceived Exertion (Exercise)  15    Perceived Dyspnea (Exercise)  1    Duration  Continue with 45 min of aerobic exercise without signs/symptoms of physical distress.    Intensity  THRR unchanged      Progression   Progression  Continue to progress workloads to maintain intensity without signs/symptoms of physical distress.      Resistance Training   Training Prescription  Yes    Weight  orange bands    Reps  10-15    Time   10 Minutes      Oxygen   Oxygen  Continuous    Liters  2.4      NuStep   Level  5    Minutes  34    METs  1.5      Track   Laps  8    Minutes  17       Social History   Tobacco Use  Smoking Status Never Smoker  Smokeless Tobacco Never Used    Goals Met:  Exercise tolerated well No report of cardiac concerns or symptoms Strength training completed today  Goals Unmet:  Not Applicable  Comments: Service time is from 10:30a to 12:10p    Dr. Rush Farmer is Medical Director for Pulmonary Rehab at [facility].

## 2017-07-13 ENCOUNTER — Encounter (HOSPITAL_COMMUNITY)
Admission: RE | Admit: 2017-07-13 | Discharge: 2017-07-13 | Disposition: A | Discharge status: Home / Self Care | Payer: PPO | Source: Ambulatory Visit | Attending: Pulmonary Disease | Admitting: Pulmonary Disease

## 2017-07-13 VITALS — Wt 222.9 lb

## 2017-07-13 DIAGNOSIS — J9611 Chronic respiratory failure with hypoxia: Secondary | ICD-10-CM | POA: Diagnosis not present

## 2017-07-13 NOTE — Progress Notes (Signed)
Daily Session Note  Patient Details  Name: [name] MRN: [id number] Date of Birth: 08-01-1951 Referring Provider:     Pulmonary Rehab Walk Test from 04/04/2017 in [place]  Referring Provider  Dr. Lake Bells      Encounter Date: 07/13/2017  Check In: Session Check In - 07/13/17 1012      Check-In   Location  [facility]    Staff Present  Su Hilt, MS, ACSM RCEP, Exercise Physiologist;Lisa Ysidro Evert, RN;Portia Culpeper, RN, Maxcine Ham, RN, BSN    Supervising physician immediately available to respond to emergencies  [facility] immediately available    Physician(s)  Dr. Quincy Simmonds    Medication changes reported      No    Tobacco Cessation  No Change    Warm-up and Cool-down  Performed as group-led instruction    Resistance Training Performed  Yes    VAD Patient?  No      Pain Assessment   Currently in Pain?  No/denies       Capillary Blood Glucose: No results found for this or any previous visit (from the past 24 hour(s)).  Exercise Prescription Changes - 07/13/17 1300      Response to Exercise   Blood Pressure (Admit)  100/48    Blood Pressure (Exercise)  118/60    Blood Pressure (Exit)  104/72    Heart Rate (Admit)  73 bpm    Heart Rate (Exercise)  96 bpm    Heart Rate (Exit)  62 bpm    Oxygen Saturation (Admit)  97 %    Oxygen Saturation (Exercise)  97 %    Oxygen Saturation (Exit)  96 %    Rating of Perceived Exertion (Exercise)  15    Perceived Dyspnea (Exercise)  1    Duration  Continue with 45 min of aerobic exercise without signs/symptoms of physical distress.    Intensity  THRR unchanged      Progression   Progression  Continue to progress workloads to maintain intensity without signs/symptoms of physical distress.      Resistance Training   Training Prescription  Yes    Weight  orange bands    Reps  10-15    Time  10 Minutes      Oxygen   Oxygen  Continuous    Liters  2.4      NuStep    Level  5    Minutes  17    METs  1.5      Track   Laps  6    Minutes  17       Social History   Tobacco Use  Smoking Status Never Smoker  Smokeless Tobacco Never Used    Goals Met:  Exercise tolerated well No report of cardiac concerns or symptoms Strength training completed today  Goals Unmet:  Not Applicable  Comments: Service time is from 10:30a to 12:30p    Dr. Rush Farmer is Medical Director for Pulmonary Rehab at [facility].

## 2017-07-18 ENCOUNTER — Encounter (HOSPITAL_COMMUNITY)
Admission: RE | Admit: 2017-07-18 | Discharge: 2017-07-18 | Disposition: A | Discharge status: Home / Self Care | Payer: PPO | Source: Ambulatory Visit | Attending: Pulmonary Disease | Admitting: Pulmonary Disease

## 2017-07-18 VITALS — Wt 221.8 lb

## 2017-07-18 DIAGNOSIS — J9611 Chronic respiratory failure with hypoxia: Secondary | ICD-10-CM

## 2017-07-18 NOTE — Progress Notes (Signed)
Daily Session Note  Patient Details  Name: [name] MRN: [id number] Date of Birth: 03-04-1951 Referring Provider:     Pulmonary Rehab Walk Test from 04/04/2017 in [place]  Referring Provider  Dr. Lake Bells      Encounter Date: 07/18/2017  Check In: Session Check In - 07/18/17 1238      Check-In   Location  [facility]    Staff Present  Dorna Bloom, MS, ACSM RCEP, Exercise Physiologist;Dore Oquin, MS, ACSM RCEP, Exercise Physiologist;Joan [place] Reeves, RN, Roque Cash, RN    Supervising physician immediately available to respond to emergencies  [facility] immediately available    Physician(s)  Dr. Maylene Roes    Medication changes reported      No    Fall or balance concerns reported     No    Tobacco Cessation  No Change    Warm-up and Cool-down  Performed as group-led instruction    Resistance Training Performed  Yes    VAD Patient?  No      Pain Assessment   Currently in Pain?  No/denies    Multiple Pain Sites  No       Capillary Blood Glucose: No results found for this or any previous visit (from the past 24 hour(s)).  Exercise Prescription Changes - 07/18/17 1200      Response to Exercise   Blood Pressure (Admit)  100/50    Blood Pressure (Exercise)  110/60    Blood Pressure (Exit)  104/54    Heart Rate (Admit)  54 bpm    Heart Rate (Exercise)  69 bpm    Heart Rate (Exit)  81 bpm    Oxygen Saturation (Admit)  99 %    Oxygen Saturation (Exercise)  95 %    Oxygen Saturation (Exit)  98 %    Rating of Perceived Exertion (Exercise)  15    Perceived Dyspnea (Exercise)  1    Duration  Continue with 45 min of aerobic exercise without signs/symptoms of physical distress.    Intensity  THRR unchanged      Progression   Progression  Continue to progress workloads to maintain intensity without signs/symptoms of physical distress.      Resistance Training   Training Prescription  Yes    Weight  orange bands     Reps  10-15    Time  10 Minutes      Oxygen   Oxygen  Continuous    Liters  2.4      NuStep   Level  5    Minutes  34    METs  1.5      Track   Laps  8    Minutes  17       Social History   Tobacco Use  Smoking Status Never Smoker  Smokeless Tobacco Never Used    Goals Met:  Exercise tolerated well No report of cardiac concerns or symptoms Strength training completed today  Goals Unmet:  Not Applicable  Comments: Service time is from 10:30a to 12:15p    Dr. Rush Farmer is Medical Director for Pulmonary Rehab at [facility].

## 2017-07-20 ENCOUNTER — Encounter (HOSPITAL_COMMUNITY)
Admission: RE | Admit: 2017-07-20 | Discharge: 2017-07-20 | Disposition: A | Discharge status: Home / Self Care | Payer: PPO | Source: Ambulatory Visit | Attending: Internal Medicine | Admitting: Internal Medicine

## 2017-07-20 VITALS — Wt 220.2 lb

## 2017-07-20 DIAGNOSIS — J9611 Chronic respiratory failure with hypoxia: Secondary | ICD-10-CM

## 2017-07-20 NOTE — Progress Notes (Signed)
Daily Session Note  Patient Details  Name: [name] MRN: [id number] Date of Birth: Apr 05, 1951 Referring Provider:     Pulmonary Rehab Walk Test from 04/04/2017 in [place]  Referring Provider  Dr. Lake Bells      Encounter Date: 07/20/2017  Check In: Session Check In - 07/20/17 1030      Check-In   Location  [facility]    Staff Present  Rosebud Poles, RN, BSN;Molly diVincenzo, MS, ACSM RCEP, Exercise Physiologist;Lisa Ysidro Evert, RN;Abeni Finchum Rollene Rotunda, RN, BSN    Supervising physician immediately available to respond to emergencies  [facility] immediately available    Physician(s)  Dr. Broadus John    Medication changes reported      No    Fall or balance concerns reported     No    Tobacco Cessation  No Change    Warm-up and Cool-down  Performed as group-led instruction    Resistance Training Performed  Yes    VAD Patient?  No      Pain Assessment   Currently in Pain?  No/denies    Multiple Pain Sites  No       Capillary Blood Glucose: No results found for this or any previous visit (from the past 24 hour(s)).  Exercise Prescription Changes - 07/20/17 1200      Response to Exercise   Blood Pressure (Admit)  100/64    Blood Pressure (Exercise)  110/60    Blood Pressure (Exit)  96/50    Heart Rate (Admit)  57 bpm    Heart Rate (Exercise)  94 bpm    Heart Rate (Exit)  68 bpm    Oxygen Saturation (Admit)  99 %    Oxygen Saturation (Exercise)  96 %    Oxygen Saturation (Exit)  97 %    Rating of Perceived Exertion (Exercise)  15    Perceived Dyspnea (Exercise)  1    Duration  Continue with 45 min of aerobic exercise without signs/symptoms of physical distress.    Intensity  THRR unchanged      Progression   Progression  Continue to progress workloads to maintain intensity without signs/symptoms of physical distress.      Resistance Training   Training Prescription  Yes    Weight  orange bands    Reps  10-15    Time  10  Minutes      Oxygen   Oxygen  Continuous    Liters  2.4      NuStep   Level  5    Minutes  17    METs  1.7      Track   Laps  8    Minutes  17       Social History   Tobacco Use  Smoking Status Never Smoker  Smokeless Tobacco Never Used    Goals Met:  Independence with exercise equipment Improved SOB with ADL's Using PLB without cueing & demonstrates good technique Personal goals reviewed No report of cardiac concerns or symptoms Strength training completed today  Goals Unmet:  Not Applicable  Comments: Service time is from 1030 to 1225   Dr. Rush Farmer is Medical Director for Pulmonary Rehab at [facility].

## 2017-07-20 NOTE — Progress Notes (Signed)
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  Continue with 45 min of aerobic exercise without signs/symptoms of physical distress.  Continue with 45 min of aerobic exercise without signs/symptoms of physical distress.   Intensity  THRR unchanged  -  THRR unchanged  THRR unchanged  THRR unchanged     Progression   Progression  Continue to progress workloads to maintain intensity without signs/symptoms of physical distress.  -  Continue to progress workloads to maintain intensity without signs/symptoms of physical distress.  Continue to progress workloads to maintain  intensity without signs/symptoms of physical distress.  Continue to progress workloads to maintain intensity without signs/symptoms of physical distress.     Horticulturist, commercial Prescription  Yes  -  Yes  Yes  Yes   Weight  orange bands  -  orange bands  orange bands  orange bands   Reps  10-15  -  10-15  10-15  10-15   Time  10 Minutes  -  10 Minutes  10 Minutes  10 Minutes     Oxygen   Oxygen  Continuous  -  Continuous  Continuous  Continuous   Liters  2.4  -  2.4  2.4  2.4     NuStep   Level  5  -  _0 Minutes  17  -  34  17  34   METs  1.2  -  1.5  1.5  1.5     Track   Laps  7  -  _1 Minutes  17  -  _2 Home Exercise Plan   Plans to continue exercise at  -  Home (comment)  -  -  -   Frequency  -  Add 2 additional days to program exercise sessions.  -  -  -   Row Name 07/20/17 1200             Response to Exercise   Blood Pressure (Admit)  100/64       Blood Pressure (Exercise)  110/60       Blood Pressure (Exit)  96/50       Heart Rate (Admit)  57 bpm       Heart Rate (Exercise)  94 bpm       Heart Rate (Exit)  68 bpm       Oxygen Saturation (Admit)  99 %       Oxygen Saturation (Exercise)  96 %       Oxygen Saturation (Exit)  97 %       Rating of Perceived Exertion (Exercise)  15       Perceived Dyspnea (Exercise)  1       Duration  Continue with 45 min of aerobic exercise without signs/symptoms of physical distress.       Intensity  THRR unchanged         Progression   Progression  Continue to progress workloads to maintain intensity without signs/symptoms of physical distress.         Resistance Training   Training Prescription  Yes       Weight  orange bands       Reps  10-15       Time  10 Minutes         Oxygen   Oxygen  Continuous       Liters  2.4         NuStep   Level  5       Minutes  17       METs  1.7         Track   Laps  8       Minutes  17          Exercise Comments: Exercise Comments    Row  Name 07/06/17 1341           Exercise Comments  home exercise completed          Exercise Goals and Review:   Exercise Goals Re-Evaluation : Exercise Goals Re-Evaluation    Row Name 05/02/17 [phone] 05/22/17 1217 06/26/17  [id number] 07/20/17 1547       Exercise Goal Re-Evaluation   Exercise Goals Review  Increase Physical Activity;Increase Strength and Stamina;Able to understand and use Dyspnea scale;Able to understand and use rate of perceived exertion (RPE) scale;Knowledge and understanding of Target Heart Rate Range (THRR);Understanding of Exercise Prescription  Able to understand and use Dyspnea scale;Increase Strength and Stamina;Increase Physical Activity;Able to understand and use rate of perceived exertion (RPE) scale;Knowledge and understanding of Target Heart Rate Range (THRR);Understanding of Exercise Prescription  Increase Physical Activity;Increase Strength and Stamina;Able to understand and use Dyspnea scale;Able to understand and use rate of perceived exertion (RPE) scale;Knowledge and understanding of Target Heart Rate Range (THRR);Understanding of Exercise Prescription  Increase Strength and Stamina;Increase Physical Activity;Able to understand and use Dyspnea scale;Able to understand and use rate of perceived exertion (RPE) scale;Knowledge and understanding of Target Heart Rate Range (THRR);Understanding of Exercise Prescription    Comments  Patient has had a slow start with the physical activity. She is limited by her orthopedic issues. Progression will need to be low and slow for this patient. Her effort wavers--will cont. to monitor and progress as able.   Patient has had a slow start with the physical activity. She is limited by her orthopedic issues. Progression will need to be low and slow for this patient. Her effort wavers--will cont. to monitor and progress as able.   Patient is slowly progressing. She states that she is beginning to feel as though she is making improvements. Has  chronic pain that limits her. Will extend her rehab time out to 36 sessions. Walking 4-5 laps (200 ft each) in 15 minutes.   Patient is slowly progressing. Her attendance is very consistent. She is motivated to work hard but is limited by her orthopedic issues and chronic pain. She is able to walk 8 laps (200 ft. each) in 15 minutes using an assistive device. MET level still falls in the "low" range. We have extended her stay at [facility]monary Rehab so she has the chance to make additional improvements.    Expected Outcomes  Through exercise and education at rehab and at home, patient will increase strength and stamina. Patient will also gain a better understanding of the need for physical activity on a daily basis and the effects it can have on quality of life.   Through exercise and education at rehab and at home, patient will increase strength and stamina. Patient will also gain a better understanding of the need for physical activity on a daily basis and the effects it can have on quality of life.   Through exercise and education at rehab and at home, patient will increase strength and stamina. Patient will also gain a better understanding of the need for physical activity on a daily basis and the effects it can have on quality of life.   Through exercise at rehab and at home, patient will increase strength and stamina making ADL's easier to perform. Patient will also have a better understanding of safe exercise and what they are capable to do outside of clinical supervision.       Discharge Exercise Prescription (Final Exercise Prescription Changes): Exercise Prescription Changes - 07/20/17 1200      Response to Exercise   Blood Pressure (Admit)  100/64    Blood Pressure (Exercise)  110/60    Blood Pressure (Exit)  96/50    Heart Rate (Admit)  57 bpm    Heart Rate (Exercise)  94 bpm    Heart Rate (  Exit)  68 bpm    Oxygen Saturation (Admit)  99 %    Oxygen Saturation (Exercise)  96 %    Oxygen  Saturation (Exit)  97 %    Rating of Perceived Exertion (Exercise)  15    Perceived Dyspnea (Exercise)  1    Duration  Continue with 45 min of aerobic exercise without signs/symptoms of physical distress.    Intensity  THRR unchanged      Progression   Progression  Continue to progress workloads to maintain intensity without signs/symptoms of physical distress.      Resistance Training   Training Prescription  Yes    Weight  orange bands    Reps  10-15    Time  10 Minutes      Oxygen   Oxygen  Continuous    Liters  2.4      NuStep   Level  5    Minutes  17    METs  1.7      Track   Laps  8    Minutes  17       Nutrition:  Target Goals: Understanding of nutrition guidelines, daily intake of sodium <1525m, cholesterol <2057m calories 30% from fat and 7% or less from saturated fats, daily to have 5 or more servings of fruits and vegetables.  Biometrics: Pre Biometrics - 03/27/17 1318      Pre Biometrics   Grip Strength  16 kg        Nutrition Therapy Plan and Nutrition Goals: Nutrition Therapy & Goals - 05/01/17 1500      Nutrition Therapy   Diet  TLC      Personal Nutrition Goals   Nutrition Goal  Identify food quantities necessary to achieve wt loss of 1-2 lb per week to a goal wt loss of 6-24 lb at graduation from pulmonary rehab.    Personal Goal #2  Pt to be able to name foods that affect blood glucose. Pt's A1c was 6.1 01/25/16.      Intervention Plan   Intervention  Prescribe, educate and counsel regarding individualized specific dietary modifications aiming towards targeted core components such as weight, hypertension, lipid management, diabetes, heart failure and other comorbidities.    Expected Outcomes  Short Term Goal: Understand basic principles of dietary content, such as calories, fat, sodium, cholesterol and nutrients.;Long Term Goal: Adherence to prescribed nutrition plan.       Nutrition Discharge: Rate Your Plate Scores: Nutrition  Assessments - 05/01/17 1455      Rate Your Plate Scores   Pre Score  57       Nutrition Goals Re-Evaluation:   Nutrition Goals Discharge (Final Nutrition Goals Re-Evaluation):   Psychosocial: Target Goals: Acknowledge presence or absence of significant depression and/or stress, maximize coping skills, provide positive support system. Participant is able to verbalize types and ability to use techniques and skills needed for reducing stress and depression.  Initial Review & Psychosocial Screening: Initial Psych Review & Screening - 03/27/17 1325      Initial Review   Current issues with  None Identified      Family Dynamics   Good Support System?  Yes      Barriers   Psychosocial barriers to participate in program  There are no identifiable barriers or psychosocial needs.      Screening Interventions   Interventions  Encouraged to exercise       Quality of Life Scores:   PHQ-9: Recent Review Flowsheet Data  Depression screen [facility] 2/9 03/27/2017 12/27/2016 04/22/2015   Decreased Interest 0 1 0   Down, Depressed, Hopeless 0 1 1   PHQ - 2 Score 0 2 1     Interpretation of Total Score  Total Score Depression Severity:  1-4 = Minimal depression, 5-9 = Mild depression, 10-14 = Moderate depression, 15-19 = Moderately severe depression, 20-27 = Severe depression   Psychosocial Evaluation and Intervention: Psychosocial Evaluation - 03/27/17 1326      Psychosocial Evaluation & Interventions   Interventions  Encouraged to exercise with the program and follow exercise prescription    Continue Psychosocial Services   No Follow up required       Psychosocial Re-Evaluation: Psychosocial Re-Evaluation    [place] Name 05/03/17 1801 05/26/17 1159 06/25/17 2110 07/17/17 1429       Psychosocial Re-Evaluation   Current issues with  None Identified  None Identified  None Identified  None Identified    Comments  -  -  patient continues to remain free from psychosocial barriers   patient continues to remain free from psychosocial barriers    Expected Outcomes  patient will remain free from psychosocial barriers to participation in pulmonary rehab  patient will remain free from psychosocial barriers to participation in pulmonary rehab  patient will remain free from psychosocial barriers to participation in pulmonary rehab  patient will remain free from psychosocial barriers to participation in pulmonary rehab    Interventions  Encouraged to attend Pulmonary Rehabilitation for the exercise  Encouraged to attend Pulmonary Rehabilitation for the exercise  Encouraged to attend Pulmonary Rehabilitation for the exercise  Encouraged to attend Pulmonary Rehabilitation for the exercise    Continue Psychosocial Services   No Follow up required  No Follow up required  No Follow up required  No Follow up required       Psychosocial Discharge (Final Psychosocial Re-Evaluation): Psychosocial Re-Evaluation - 07/17/17 1429      Psychosocial Re-Evaluation   Current issues with  None Identified    Comments  patient continues to remain free from psychosocial barriers    Expected Outcomes  patient will remain free from psychosocial barriers to participation in pulmonary rehab    Interventions  Encouraged to attend Pulmonary Rehabilitation for the exercise    Continue Psychosocial Services   No Follow up required       Education: Education Goals: Education classes will be provided on a weekly basis, covering required topics. Participant will state understanding/return demonstration of topics presented.  Learning Barriers/Preferences:   Education Topics: Risk Factor Reduction:  -Group instruction that is supported by a PowerPoint presentation. Instructor discusses the definition of a risk factor, different risk factors for pulmonary disease, and how the heart and lungs work together.     Nutrition for Pulmonary Patient:  -Group instruction provided by PowerPoint slides, verbal  discussion, and written materials to support subject matter. The instructor gives an explanation and review of healthy diet recommendations, which includes a discussion on weight management, recommendations for fruit and vegetable consumption, as well as protein, fluid, caffeine, fiber, sodium, sugar, and alcohol. Tips for eating when patients are short of breath are discussed.   PULMONARY REHAB OTHER RESPIRATORY from 07/13/2017 in [place]  Date  06/22/17  Educator  Nuritionist  Instruction Review Code  R- Review/reinforce      Pursed Lip Breathing:  -Group instruction that is supported by demonstration and informational handouts. Instructor discusses the benefits of pursed lip and diaphragmatic breathing and  detailed demonstration on how to preform both.     Oxygen Safety:  -Group instruction provided by PowerPoint, verbal discussion, and written material to support subject matter. There is an overview of "What is Oxygen" and "Why do we need it".  Instructor also reviews how to create a safe environment for oxygen use, the importance of using oxygen as prescribed, and the risks of noncompliance. There is a brief discussion on traveling with oxygen and resources the patient may utilize.   PULMONARY REHAB OTHER RESPIRATORY from 07/13/2017 in [place]  Date  06/29/17  Educator  Haunani Dickard  Instruction Review Code  2- meets goals/outcomes      Oxygen Equipment:  -Group instruction provided by [employer] Staff utilizing handouts, written materials, and equipment demonstrations.   Signs and Symptoms:  -Group instruction provided by written material and verbal discussion to support subject matter. Warning signs and symptoms of infection, stroke, and heart attack are reviewed and when to call the physician/911 reinforced. Tips for preventing the spread of infection discussed.   PULMONARY REHAB OTHER RESPIRATORY from 07/13/2017 in  [place]  Date  05/25/17  Educator  RN  Instruction Review Code  2- meets goals/outcomes      Advanced Directives:  -Group instruction provided by verbal instruction and written material to support subject matter. Instructor reviews Advanced Directive laws and proper instruction for filling out document.   Pulmonary Video:  -Group video education that reviews the importance of medication and oxygen compliance, exercise, good nutrition, pulmonary hygiene, and pursed lip and diaphragmatic breathing for the pulmonary patient.   PULMONARY REHAB OTHER RESPIRATORY from 07/13/2017 in [place]  Date  06/01/17  Instruction Review Code  2- meets goals/outcomes      Exercise for the Pulmonary Patient:  -Group instruction that is supported by a PowerPoint presentation. Instructor discusses benefits of exercise, core components of exercise, frequency, duration, and intensity of an exercise routine, importance of utilizing pulse oximetry during exercise, safety while exercising, and options of places to exercise outside of rehab.     PULMONARY REHAB OTHER RESPIRATORY from 07/20/2017 in [place]  Date  07/20/17  Educator  Cloyde Reams  Instruction Review Code  2- meets goals/outcomes      Pulmonary Medications:  -Verbally interactive group education provided by instructor with focus on inhaled medications and proper administration.   PULMONARY REHAB OTHER RESPIRATORY from 07/13/2017 in [place]  Date  07/06/17  Educator  pharmacist  Instruction Review Code  R- Review/reinforce      Anatomy and Physiology of the Respiratory System and Intimacy:  -Group instruction provided by PowerPoint, verbal discussion, and written material to support subject matter. Instructor reviews respiratory cycle and anatomical components of the respiratory system and their functions.  Instructor also reviews differences in obstructive and restrictive respiratory diseases with examples of each. Intimacy, Sex, and Sexuality differences are reviewed with a discussion on how relationships can change when diagnosed with pulmonary disease. Common sexual concerns are reviewed.   MD DAY -A group question and answer session with a medical doctor that allows participants to ask questions that relate to their pulmonary disease state.   PULMONARY REHAB OTHER RESPIRATORY from 07/13/2017 in [place]  Date  06/13/17  Educator  Nelda Marseille  Instruction Review Code  2- meets goals/outcomes      OTHER EDUCATION -Group or individual verbal, written,  or video instructions that support the educational goals of the pulmonary rehab program.   PULMONARY REHAB OTHER RESPIRATORY from 07/13/2017 in [place]  Date  07/13/17 [[id number] [employer]]  Educator  RD  Instruction Review Code  1- Verbalizes Understanding      Knowledge Questionnaire Score: Knowledge Questionnaire Score - 04/04/17 0914      Knowledge Questionnaire Score   Pre Score  13/13       Core Components/Risk Factors/Patient Goals at Admission: Personal Goals and Risk Factors at Admission - 03/27/17 1324      Core Components/Risk Factors/Patient Goals on Admission   Improve shortness of breath with ADL's  Yes    Intervention  Provide education, individualized exercise plan and daily activity instruction to help decrease symptoms of SOB with activities of daily living.    Expected Outcomes  Short Term: Achieves a reduction of symptoms when performing activities of daily living.    Develop more efficient breathing techniques such as purse lipped breathing and diaphragmatic breathing; and practicing self-pacing with activity  Yes    Intervention  Provide education, demonstration and support about specific breathing techniuqes utilized for more efficient breathing. Include  techniques such as pursed lipped breathing, diaphragmatic breathing and self-pacing activity.    Expected Outcomes  Short Term: Participant will be able to demonstrate and use breathing techniques as needed throughout daily activities.    Increase knowledge of respiratory medications and ability to use respiratory devices properly   Yes    Intervention  Provide education and demonstration as needed of appropriate use of medications, inhalers, and oxygen therapy.    Expected Outcomes  Short Term: Achieves understanding of medications use. Understands that oxygen is a medication prescribed by physician. Demonstrates appropriate use of inhaler and oxygen therapy.    Heart Failure  Yes    Intervention  Provide a combined exercise and nutrition program that is supplemented with education, support and counseling about heart failure. Directed toward relieving symptoms such as shortness of breath, decreased exercise tolerance, and extremity edema.       Core Components/Risk Factors/Patient Goals Review:  Goals and Risk Factor Review    Row Name 05/03/17 1757 05/26/17 1158 06/25/17 2108 07/17/17 1421       Core Components/Risk Factors/Patient Goals Review   Personal Goals Review  Develop more efficient breathing techniques such as purse lipped breathing and diaphragmatic breathing and practicing self-pacing with activity.;Improve shortness of breath with ADL's;Increase knowledge of respiratory medications and ability to use respiratory devices properly.;Heart Failure  Develop more efficient breathing techniques such as purse lipped breathing and diaphragmatic breathing and practicing self-pacing with activity.;Improve shortness of breath with ADL's;Increase knowledge of respiratory medications and ability to use respiratory devices properly.;Heart Failure  Develop more efficient breathing techniques such as purse lipped breathing and diaphragmatic breathing and practicing self-pacing with activity.;Improve  shortness of breath with ADL's;Increase knowledge of respiratory medications and ability to use respiratory devices properly.;Heart Failure  Develop more efficient breathing techniques such as purse lipped breathing and diaphragmatic breathing and practicing self-pacing with activity.;Improve shortness of breath with ADL's;Increase knowledge of respiratory medications and ability to use respiratory devices properly.;Heart Failure    Review  patient is doing well in pulmonary rehab however her progress is slow. she demonstrates the pursed lip breathing technique independently. she is limited to progression related to her back issues. she is in the process of scheduling injections for back pain control and is hopeful that after the injections she will be  able to tolerate more physical activity at rehab and at home.  patient is doing well with seated exercises however her effort to walk is extremely poor. she is limited by back pain and injections have not helped the way she was hoping. will continue to monitor  patient continues to do well with seated exercise. She recieved back injections this past week however at her follow-up exercise session she stated she did not see much of an improvement in her pain with walking  patient continues to do well. her back is still limiting her ability to walk however she just recently walked 8 laps in 15 min which is a personal new record. She is doing well managing her heart failure weight. She has been extended to 36 sessions    Expected Outcomes  see admission expected outcomes  see admission expected outcomes  see admission expected outcomes  see admission expected outcomes       Core Components/Risk Factors/Patient Goals at Discharge (Final Review):  Goals and Risk Factor Review - 07/17/17 1421      Core Components/Risk Factors/Patient Goals Review   Personal Goals Review  Develop more efficient breathing techniques such as purse lipped breathing and diaphragmatic  breathing and practicing self-pacing with activity.;Improve shortness of breath with ADL's;Increase knowledge of respiratory medications and ability to use respiratory devices properly.;Heart Failure    Review  patient continues to do well. her back is still limiting her ability to walk however she just recently walked 8 laps in 15 min which is a personal new record. She is doing well managing her heart failure weight. She has been extended to 36 sessions    Expected Outcomes  see admission expected outcomes       ITP Comments:   Comments: ITP REVIEW Pt is making slow expected progress toward pulmonary rehab goals after completing 26 sessions. She has been extended to 36 sessions to give her time to reach her maximum potential. Recommend continued exercise, life style modification, education, and utilization of breathing techniques to increase stamina and strength and decrease shortness of breath with exertion.

## 2017-07-21 ENCOUNTER — Encounter: Payer: Self-pay | Admitting: Internal Medicine

## 2017-07-21 LAB — COLOGUARD

## 2017-07-24 DIAGNOSIS — I5040 Unspecified combined systolic (congestive) and diastolic (congestive) heart failure: Secondary | ICD-10-CM | POA: Diagnosis not present

## 2017-07-24 DIAGNOSIS — J9610 Chronic respiratory failure, unspecified whether with hypoxia or hypercapnia: Secondary | ICD-10-CM | POA: Diagnosis not present

## 2017-07-24 DIAGNOSIS — R269 Unspecified abnormalities of gait and mobility: Secondary | ICD-10-CM | POA: Diagnosis not present

## 2017-07-24 DIAGNOSIS — G4733 Obstructive sleep apnea (adult) (pediatric): Secondary | ICD-10-CM | POA: Diagnosis not present

## 2017-07-25 ENCOUNTER — Encounter (HOSPITAL_COMMUNITY)
Admission: RE | Admit: 2017-07-25 | Discharge: 2017-07-25 | Disposition: A | Discharge status: Home / Self Care | Payer: PPO | Source: Ambulatory Visit | Attending: Pulmonary Disease | Admitting: Pulmonary Disease

## 2017-07-25 VITALS — Wt 220.9 lb

## 2017-07-25 DIAGNOSIS — J9611 Chronic respiratory failure with hypoxia: Secondary | ICD-10-CM

## 2017-07-25 NOTE — Progress Notes (Signed)
Daily Session Note  Patient Details  Name: [name] MRN: [id number] Date of Birth: 02-04-1951 Referring Provider:     Pulmonary Rehab Walk Test from 04/04/2017 in [place]  Referring Provider  Dr. Lake Bells      Encounter Date: 07/25/2017  Check In:   Capillary Blood Glucose: No results found for this or any previous visit (from the past 24 hour(s)).  Exercise Prescription Changes - 07/25/17 1200      Response to Exercise   Blood Pressure (Admit)  100/48    Blood Pressure (Exercise)  108/74    Blood Pressure (Exit)  114/70    Heart Rate (Admit)  61 bpm    Heart Rate (Exercise)  96 bpm    Heart Rate (Exit)  68 bpm    Oxygen Saturation (Admit)  100 %    Oxygen Saturation (Exercise)  98 %    Oxygen Saturation (Exit)  97 %    Rating of Perceived Exertion (Exercise)  14    Perceived Dyspnea (Exercise)  1    Duration  Continue with 45 min of aerobic exercise without signs/symptoms of physical distress.    Intensity  THRR unchanged      Progression   Progression  Continue to progress workloads to maintain intensity without signs/symptoms of physical distress.      Resistance Training   Training Prescription  Yes    Weight  orange bands    Reps  10-15    Time  10 Minutes      Oxygen   Oxygen  Continuous    Liters  2.4      NuStep   Level  5    Minutes  25    METs  1.5      Track   Laps  9    Minutes  17       Social History   Tobacco Use  Smoking Status Never Smoker  Smokeless Tobacco Never Used    Goals Met:  Exercise tolerated well No report of cardiac concerns or symptoms Strength training completed today  Goals Unmet:  Not Applicable  Comments: Service time is from 10:30a to 12:15p    Dr. Rush Farmer is Medical Director for Pulmonary Rehab at [facility].

## 2017-08-01 ENCOUNTER — Encounter (HOSPITAL_COMMUNITY)
Admission: RE | Admit: 2017-08-01 | Discharge: 2017-08-01 | Disposition: A | Discharge status: Home / Self Care | Payer: PPO | Source: Ambulatory Visit | Attending: Pulmonary Disease | Admitting: Pulmonary Disease

## 2017-08-01 VITALS — Wt 224.2 lb

## 2017-08-01 DIAGNOSIS — J9611 Chronic respiratory failure with hypoxia: Secondary | ICD-10-CM | POA: Diagnosis not present

## 2017-08-01 NOTE — Progress Notes (Signed)
Daily Session Note  Patient Details  Name: [name] MRN: [id number] Date of Birth: [birth year] Referring Provider:     Pulmonary Rehab Walk Test from 04/04/2017 in [place]  Referring Provider  Dr. Lake Bells      Encounter Date: 08/01/2017  Check In: Session Check In - 08/01/17 1030      Check-In   Location  [facility]    Staff Present  Rosebud Poles, RN, BSN;Molly diVincenzo, MS, ACSM RCEP, Exercise Physiologist;Kalieb Freeland Ysidro Evert, RN;Portia Rollene Rotunda, RN, BSN    Supervising physician immediately available to respond to emergencies  [facility] immediately available    Physician(s)  Dr. Maylene Roes    Medication changes reported      No    Fall or balance concerns reported     No    Tobacco Cessation  No Change    Warm-up and Cool-down  Performed as group-led instruction    Resistance Training Performed  Yes    VAD Patient?  No      Pain Assessment   Currently in Pain?  No/denies    Multiple Pain Sites  No       Capillary Blood Glucose: No results found for this or any previous visit (from the past 24 hour(s)).  Exercise Prescription Changes - 08/01/17 1200      Response to Exercise   Blood Pressure (Admit)  102/40    Blood Pressure (Exercise)  118/64    Blood Pressure (Exit)  96/54    Heart Rate (Admit)  56 bpm    Heart Rate (Exercise)  103 bpm    Heart Rate (Exit)  62 bpm    Oxygen Saturation (Admit)  100 %    Oxygen Saturation (Exercise)  96 %    Oxygen Saturation (Exit)  100 %    Rating of Perceived Exertion (Exercise)  15    Perceived Dyspnea (Exercise)  1    Duration  Continue with 45 min of aerobic exercise without signs/symptoms of physical distress.    Intensity  THRR unchanged      Progression   Progression  Continue to progress workloads to maintain intensity without signs/symptoms of physical distress.      Resistance Training   Training Prescription  Yes    Weight  orange bands    Reps  10-15    Time   10 Minutes      Oxygen   Oxygen  Continuous    Liters  2-4      NuStep   Level  6    Minutes  34    METs  1.7      Track   Laps  7    Minutes  17       Social History   Tobacco Use  Smoking Status Never Smoker  Smokeless Tobacco Never Used    Goals Met:  Exercise tolerated well No report of cardiac concerns or symptoms Strength training completed today  Goals Unmet:  Not Applicable  Comments: Service time is from 1030 to 1215    Dr. Rush Farmer is Medical Director for Pulmonary Rehab at [facility].

## 2017-08-02 ENCOUNTER — Other Ambulatory Visit (HOSPITAL_COMMUNITY): Payer: Self-pay | Admitting: Internal Medicine
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
## 2017-08-03 ENCOUNTER — Encounter (HOSPITAL_COMMUNITY)
Admission: RE | Admit: 2017-08-03 | Discharge: 2017-08-03 | Disposition: A | Discharge status: Home / Self Care | Payer: PPO | Source: Ambulatory Visit | Attending: Pulmonary Disease | Admitting: Pulmonary Disease

## 2017-08-03 VITALS — Wt 222.4 lb

## 2017-08-03 DIAGNOSIS — J9611 Chronic respiratory failure with hypoxia: Secondary | ICD-10-CM | POA: Diagnosis not present

## 2017-08-03 NOTE — Progress Notes (Signed)
Daily Session Note  Patient Details  Name: NATORIA ARCHIBALD MRN: [id number] Date of Birth: 05-12-1951 Referring Provider:     Pulmonary Rehab Walk Test from 04/04/2017 in [place]  Referring Provider  Dr. Lake Bells      Encounter Date: 08/03/2017  Check In: Session Check In - 08/03/17 1113      Check-In   Location  [facility]    Staff Present  Trish Fountain, RN, Maxcine Ham, RN, BSN;Molly diVincenzo, MS, ACSM RCEP, Exercise Physiologist;Camyah Pultz Ysidro Evert, RN    Supervising physician immediately available to respond to emergencies  [facility] immediately available    Physician(s)  Dr. [place] Crane    Medication changes reported      No    Fall or balance concerns reported     No    Tobacco Cessation  No Change    Warm-up and Cool-down  Performed as group-led instruction    Resistance Training Performed  Yes    VAD Patient?  No      Pain Assessment   Currently in Pain?  No/denies    Multiple Pain Sites  No       Capillary Blood Glucose: No results found for this or any previous visit (from the past 24 hour(s)).  Exercise Prescription Changes - 08/03/17 1300      Response to Exercise   Blood Pressure (Admit)  106/54    Blood Pressure (Exercise)  130/70    Blood Pressure (Exit)  104/60    Heart Rate (Admit)  66 bpm    Heart Rate (Exercise)  92 bpm    Heart Rate (Exit)  78 bpm    Oxygen Saturation (Admit)  96 %    Oxygen Saturation (Exercise)  97 %    Oxygen Saturation (Exit)  98 %    Rating of Perceived Exertion (Exercise)  15    Perceived Dyspnea (Exercise)  1    Duration  Continue with 45 min of aerobic exercise without signs/symptoms of physical distress.    Intensity  THRR unchanged      Progression   Progression  Continue to progress workloads to maintain intensity without signs/symptoms of physical distress.      Resistance Training   Training Prescription  Yes    Weight  orange bands    Reps  10-15    Time  10  Minutes      Oxygen   Oxygen  Continuous    Liters  2-4      NuStep   Level  6    Minutes  17    METs  1.6      Track   Laps  8    Minutes  17       Social History   Tobacco Use  Smoking Status Never Smoker  Smokeless Tobacco Never Used    Goals Met:  Exercise tolerated well No report of cardiac concerns or symptoms Strength training completed today  Goals Unmet:  Not Applicable  Comments: Service time is from 1030 to 1235    Dr. Rush Farmer is Medical Director for Pulmonary Rehab at [facility].

## 2017-08-04 DIAGNOSIS — J9610 Chronic respiratory failure, unspecified whether with hypoxia or hypercapnia: Secondary | ICD-10-CM | POA: Diagnosis not present

## 2017-08-04 DIAGNOSIS — I5040 Unspecified combined systolic (congestive) and diastolic (congestive) heart failure: Secondary | ICD-10-CM | POA: Diagnosis not present

## 2017-08-04 DIAGNOSIS — R269 Unspecified abnormalities of gait and mobility: Secondary | ICD-10-CM | POA: Diagnosis not present

## 2017-08-08 ENCOUNTER — Ambulatory Visit: Payer: PPO | Admitting: Internal Medicine

## 2017-08-08 ENCOUNTER — Encounter (HOSPITAL_COMMUNITY)
Admission: RE | Admit: 2017-08-08 | Discharge: 2017-08-08 | Disposition: A | Discharge status: Home / Self Care | Payer: PPO | Source: Ambulatory Visit | Attending: Pulmonary Disease | Admitting: Pulmonary Disease

## 2017-08-08 ENCOUNTER — Encounter: Payer: Self-pay | Admitting: Internal Medicine

## 2017-08-08 VITALS — BP 102/58 | HR 73 | Temp 97.8°F | Wt 222.4 lb

## 2017-08-08 DIAGNOSIS — Z79899 Other long term (current) drug therapy: Secondary | ICD-10-CM | POA: Diagnosis not present

## 2017-08-08 DIAGNOSIS — E538 Deficiency of other specified B group vitamins: Secondary | ICD-10-CM

## 2017-08-08 DIAGNOSIS — M2550 Pain in unspecified joint: Secondary | ICD-10-CM | POA: Diagnosis not present

## 2017-08-08 DIAGNOSIS — Z8739 Personal history of other diseases of the musculoskeletal system and connective tissue: Secondary | ICD-10-CM

## 2017-08-08 DIAGNOSIS — J9611 Chronic respiratory failure with hypoxia: Secondary | ICD-10-CM | POA: Insufficient documentation

## 2017-08-08 DIAGNOSIS — E039 Hypothyroidism, unspecified: Secondary | ICD-10-CM

## 2017-08-08 DIAGNOSIS — Z7901 Long term (current) use of anticoagulants: Secondary | ICD-10-CM | POA: Diagnosis not present

## 2017-08-08 DIAGNOSIS — D519 Vitamin B12 deficiency anemia, unspecified: Secondary | ICD-10-CM

## 2017-08-08 DIAGNOSIS — J9610 Chronic respiratory failure, unspecified whether with hypoxia or hypercapnia: Secondary | ICD-10-CM

## 2017-08-08 DIAGNOSIS — I5032 Chronic diastolic (congestive) heart failure: Secondary | ICD-10-CM | POA: Diagnosis not present

## 2017-08-08 MED ORDER — CYANOCOBALAMIN 1000 MCG/ML IJ SOLN
1000.0000 ug | Freq: Once | INTRAMUSCULAR | Status: AC
  Administered 2017-08-08: 1000 ug via INTRAMUSCULAR

## 2017-08-08 NOTE — Patient Instructions (Addendum)
b12 shot for now .   Continue healthy weight loss .  Try taking the topical voltaren    Every day twice a day  for  a week .   And see if calms down.      Need tsh   And cbc and bmp    By February  March

## 2017-08-10 ENCOUNTER — Encounter (HOSPITAL_COMMUNITY)
Admission: RE | Admit: 2017-08-10 | Discharge: 2017-08-10 | Disposition: A | Discharge status: Home / Self Care | Payer: PPO | Source: Ambulatory Visit | Attending: Pulmonary Disease | Admitting: Pulmonary Disease

## 2017-08-10 VITALS — Wt 222.0 lb

## 2017-08-10 DIAGNOSIS — J9611 Chronic respiratory failure with hypoxia: Secondary | ICD-10-CM

## 2017-08-10 NOTE — Progress Notes (Signed)
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 bands  orange bands  orange bands  orange bands  orange bands   Reps  10-15  10-15  10-15  10-15  10-15   Time  10 Minutes  10 Minutes  10 Minutes  10 Minutes  10 Minutes     Oxygen   Oxygen  Continuous  Continuous  Continuous  Continuous  Continuous   Liters  2.4  2.4  2.4  2-4  2.4     NuStep   Level  _2 Minutes  _3 METs  1.8  1.7  1.6  1.6  1.5     Track   Laps  _4 Minutes  _5 Row Name 07/06/17 1243 07/06/17 1300 07/11/17 1200 07/13/17 1300 07/18/17 1200     Response to Exercise   Blood Pressure (Admit)  90/40  -  100/50  100/48   100/50   Blood Pressure (Exercise)  110/50  -  98/50  118/60  110/60   Blood Pressure (Exit)  105/60  -  100/60  104/72  104/54   Heart Rate (Admit)  59 bpm  -  63 bpm  73 bpm  54 bpm   Heart Rate (Exercise)  114 bpm  -  111 bpm  96  bpm  69 bpm   Heart Rate (Exit)  95 bpm  -  66 bpm  62 bpm  81 bpm   Oxygen Saturation (Admit)  100 %  -  100 %  97 %  99 %   Oxygen Saturation (Exercise)  94 %  -  94 %  97 %  95 %   Oxygen Saturation (Exit)  98 %  -  93 %  96 %  98 %   Rating of Perceived Exertion (Exercise)  15  -  _0 Perceived Dyspnea (Exercise)  3  -  _1 Duration  Continue with 45 min of aerobic exercise without signs/symptoms of physical distress.  -  Continue with 45 min of aerobic exercise without signs/symptoms of physical distress.  Continue with 45 min of aerobic exercise without signs/symptoms of physical distress.  Continue with 45 min of aerobic exercise without signs/symptoms of physical distress.   Intensity  THRR unchanged  -  THRR unchanged  THRR unchanged  THRR unchanged     Progression   Progression  Continue to progress workloads to maintain intensity without signs/symptoms of physical distress.  -  Continue to progress workloads to maintain intensity without signs/symptoms of physical distress.  Continue to progress workloads to maintain intensity without signs/symptoms of physical distress.  Continue to progress workloads to maintain intensity without signs/symptoms of physical distress.     Resistance Training   Training Prescription  Yes  -  Yes  Yes  Yes   Weight  orange bands  -  orange bands  orange bands  orange bands   Reps  10-15  -  10-15  10-15  10-15   Time  10 Minutes  -  10 Minutes  10 Minutes  10 Minutes     Oxygen   Oxygen  Continuous  -  Continuous  Continuous  Continuous   Liters  2.4  -  2.4  2.4  2.4     NuStep   Level  5  -  _2 Minutes  17  -  34  17  34   METs  1.2  -  1.5  1.5  1.5     Track   Laps  7  -  _3 Minutes  17  -  _4 Home Exercise Plan   Plans to continue exercise at  -  Home (comment)  -  -  -   Frequency  -  Add 2 additional days to program exercise sessions.  -  -  -   Row Name 07/20/17 1200 07/25/17 1200 08/01/17 1200 08/03/17 1300       Response to Exercise   Blood Pressure (Admit)  100/64  100/48  102/40  106/54    Blood Pressure (Exercise)  110/60  108/74  118/64  130/70    Blood Pressure (Exit)  96/50  114/70  96/54  104/60    Heart Rate (Admit)  57 bpm  61 bpm  56 bpm  66 bpm    Heart Rate (Exercise)  94 bpm  96 bpm  103 bpm  92 bpm    Heart Rate (Exit)  68 bpm  68 bpm  62 bpm  78 bpm    Oxygen Saturation (Admit)  99 %  100 %  100 %  96 %  Oxygen Saturation (Exercise)  96 %  98 %  96 %  97 %    Oxygen Saturation (Exit)  97 %  97 %  100 %  98 %    Rating of Perceived Exertion (Exercise)  _0 Perceived Dyspnea (Exercise)  _1 Duration  Continue with 45 min of aerobic exercise without signs/symptoms of physical distress.  Continue with 45 min of aerobic exercise without signs/symptoms of physical distress.  Continue with 45 min of aerobic exercise without signs/symptoms of physical distress.  Continue with 45 min of aerobic exercise without signs/symptoms of physical distress.    Intensity  THRR unchanged  THRR unchanged  THRR unchanged  THRR unchanged      Progression   Progression  Continue to progress workloads to maintain intensity without signs/symptoms of physical distress.  Continue to progress workloads to maintain intensity without signs/symptoms of physical distress.  Continue to progress workloads to maintain intensity without signs/symptoms of physical distress.  Continue to progress workloads to maintain intensity without signs/symptoms of physical distress.      Resistance Training   Training Prescription  Yes  Yes  Yes  Yes    Weight  orange bands  orange bands  orange bands  orange bands    Reps  10-15  10-15  10-15  10-15     Time  10 Minutes  10 Minutes  10 Minutes  10 Minutes      Oxygen   Oxygen  Continuous  Continuous  Continuous  Continuous    Liters  2.4  2.4  2-4  2-4      NuStep   Level  _2 Minutes  17  25  34  17    METs  1.7  1.5  1.7  1.6      Track   Laps  _3 Minutes  _4 Exercise Comments: Exercise Comments    Row Name 07/06/17 1341           Exercise Comments  home exercise completed          Exercise Goals and Review:   Exercise Goals Re-Evaluation : Exercise Goals Re-Evaluation    Row Name 05/02/17 4739 05/22/17 1217 06/26/17 0725 07/20/17 1547 08/08/17 0757     Exercise Goal Re-Evaluation   Exercise Goals Review  Increase Physical Activity;Increase Strength and Stamina;Able to understand and use Dyspnea scale;Able to understand and use rate of perceived exertion (RPE) scale;Knowledge and understanding of Target Heart Rate Range (THRR);Understanding of Exercise Prescription  Able to understand and use Dyspnea scale;Increase Strength and Stamina;Increase Physical Activity;Able to understand and use rate of perceived exertion (RPE) scale;Knowledge and understanding of Target Heart Rate Range (THRR);Understanding of Exercise Prescription  Increase Physical Activity;Increase Strength and Stamina;Able to understand and use Dyspnea scale;Able to understand and use rate of perceived exertion (RPE) scale;Knowledge and understanding of Target Heart Rate Range (THRR);Understanding of Exercise Prescription  Increase Strength and Stamina;Increase Physical Activity;Able to understand and use Dyspnea scale;Able to understand and use rate of perceived exertion (RPE) scale;Knowledge and understanding of Target Heart Rate Range (THRR);Understanding of Exercise Prescription  Increase Strength and Stamina;Increase Physical Activity;Able to understand and use Dyspnea scale;Able to understand and use rate of perceived exertion (RPE) scale;Knowledge  and  understanding of Target Heart Rate Range (THRR);Understanding of Exercise Prescription   Comments  Patient has had a slow start with the physical activity. She is limited by her orthopedic issues. Progression will need to be low and slow for this patient. Her effort wavers--will cont. to monitor and progress as able.   Patient has had a slow start with the physical activity. She is limited by her orthopedic issues. Progression will need to be low and slow for this patient. Her effort wavers--will cont. to monitor and progress as able.   Patient is slowly progressing. She states that she is beginning to feel as though she is making improvements. Has chronic pain that limits her. Will extend her rehab time out to 36 sessions. Walking 4-5 laps (200 ft each) in 15 minutes.   Patient is slowly progressing. Her attendance is very consistent. She is motivated to work hard but is limited by her orthopedic issues and chronic pain. She is able to walk 8 laps (200 ft. each) in 15 minutes using an assistive device. MET level still falls in the "low" range. We have extended her stay at [facility]hab so she has the chance to make additional improvements.  Patient is slowly progressing. Her attendance is very consistent. She is motivated to work hard but is limited by her orthopedic issues and chronic pain. She is able to walk 8 laps (200 ft. each) in 15 minutes using an assistive device. MET level still falls in the "low" range. We have extended her stay at [facility]hab so she has the chance to make additional improvements.   Expected Outcomes  Through exercise and education at rehab and at home, patient will increase strength and stamina. Patient will also gain a better understanding of the need for physical activity on a daily basis and the effects it can have on quality of life.   Through exercise and education at rehab and at home, patient will increase strength and stamina. Patient will also gain a better  understanding of the need for physical activity on a daily basis and the effects it can have on quality of life.   Through exercise and education at rehab and at home, patient will increase strength and stamina. Patient will also gain a better understanding of the need for physical activity on a daily basis and the effects it can have on quality of life.   Through exercise at rehab and at home, patient will increase strength and stamina making ADL's easier to perform. Patient will also have a better understanding of safe exercise and what they are capable to do outside of clinical supervision.  Through exercise at rehab and at home, patient will increase strength and stamina making ADL's easier to perform. Patient will also have a better understanding of safe exercise and what they are capable to do outside of clinical supervision.      Discharge Exercise Prescription (Final Exercise Prescription Changes): Exercise Prescription Changes - 08/03/17 1300      Response to Exercise   Blood Pressure (Admit)  106/54    Blood Pressure (Exercise)  130/70    Blood Pressure (Exit)  104/60    Heart Rate (Admit)  66 bpm    Heart Rate (Exercise)  92 bpm    Heart Rate (Exit)  78 bpm    Oxygen Saturation (Admit)  96 %    Oxygen Saturation (Exercise)  97 %    Oxygen Saturation (Exit)  98 %    Rating of  Perceived Exertion (Exercise)  15    Perceived Dyspnea (Exercise)  1    Duration  Continue with 45 min of aerobic exercise without signs/symptoms of physical distress.    Intensity  THRR unchanged      Progression   Progression  Continue to progress workloads to maintain intensity without signs/symptoms of physical distress.      Resistance Training   Training Prescription  Yes    Weight  orange bands    Reps  10-15    Time  10 Minutes      Oxygen   Oxygen  Continuous    Liters  2-4      NuStep   Level  6    Minutes  17    METs  1.6      Track   Laps  8    Minutes  17       Nutrition:   Target Goals: Understanding of nutrition guidelines, daily intake of sodium <1542m, cholesterol <2031m calories 30% from fat and 7% or less from saturated fats, daily to have 5 or more servings of fruits and vegetables.  Biometrics: Pre Biometrics - 03/27/17 1318      Pre Biometrics   Grip Strength  16 kg        Nutrition Therapy Plan and Nutrition Goals: Nutrition Therapy & Goals - 05/01/17 1500      Nutrition Therapy   Diet  TLC      Personal Nutrition Goals   Nutrition Goal  Identify food quantities necessary to achieve wt loss of 1-2 lb per week to a goal wt loss of 6-24 lb at graduation from pulmonary rehab.    Personal Goal #2  Pt to be able to name foods that affect blood glucose. Pt's A1c was 6.1 01/25/16.      Intervention Plan   Intervention  Prescribe, educate and counsel regarding individualized specific dietary modifications aiming towards targeted core components such as weight, hypertension, lipid management, diabetes, heart failure and other comorbidities.    Expected Outcomes  Short Term Goal: Understand basic principles of dietary content, such as calories, fat, sodium, cholesterol and nutrients.;Long Term Goal: Adherence to prescribed nutrition plan.       Nutrition Discharge: Rate Your Plate Scores: Nutrition Assessments - 05/01/17 1455      Rate Your Plate Scores   Pre Score  57       Nutrition Goals Re-Evaluation:   Nutrition Goals Discharge (Final Nutrition Goals Re-Evaluation):   Psychosocial: Target Goals: Acknowledge presence or absence of significant depression and/or stress, maximize coping skills, provide positive support system. Participant is able to verbalize types and ability to use techniques and skills needed for reducing stress and depression.  Initial Review & Psychosocial Screening: Initial Psych Review & Screening - 03/27/17 1325      Initial Review   Current issues with  None Identified      Family Dynamics   Good Support  System?  Yes      Barriers   Psychosocial barriers to participate in program  There are no identifiable barriers or psychosocial needs.      Screening Interventions   Interventions  Encouraged to exercise       Quality of Life Scores:   PHQ-9: Recent Review Flowsheet Data    [facility] 03/27/2017 12/27/2016 04/22/2015   Decreased Interest 0 1 0   Down, Depressed, Hopeless 0 1 1   PHQ - 2 Score 0 2 1  Interpretation of Total Score  Total Score Depression Severity:  1-4 = Minimal depression, 5-9 = Mild depression, 10-14 = Moderate depression, 15-19 = Moderately severe depression, 20-27 = Severe depression   Psychosocial Evaluation and Intervention: Psychosocial Evaluation - 03/27/17 1326      Psychosocial Evaluation & Interventions   Interventions  Encouraged to exercise with the program and follow exercise prescription    Continue Psychosocial Services   No Follow up required       Psychosocial Re-Evaluation: Psychosocial Re-Evaluation    [place] Name 05/03/17 1801 05/26/17 1159 06/25/17 2110 07/17/17 1429 08/08/17 1430     Psychosocial Re-Evaluation   Current issues with  None Identified  None Identified  None Identified  None Identified  None Identified   Comments  -  -  patient continues to remain free from psychosocial barriers  patient continues to remain free from psychosocial barriers  patient continues to remain free from psychosocial barriers   Expected Outcomes  patient will remain free from psychosocial barriers to participation in pulmonary rehab  patient will remain free from psychosocial barriers to participation in pulmonary rehab  patient will remain free from psychosocial barriers to participation in pulmonary rehab  patient will remain free from psychosocial barriers to participation in pulmonary rehab  patient will remain free from psychosocial barriers to participation in pulmonary rehab   Interventions  Encouraged to attend Pulmonary  Rehabilitation for the exercise  Encouraged to attend Pulmonary Rehabilitation for the exercise  Encouraged to attend Pulmonary Rehabilitation for the exercise  Encouraged to attend Pulmonary Rehabilitation for the exercise  Encouraged to attend Pulmonary Rehabilitation for the exercise   Continue Psychosocial Services   No Follow up required  No Follow up required  No Follow up required  No Follow up required  No Follow up required      Psychosocial Discharge (Final Psychosocial Re-Evaluation): Psychosocial Re-Evaluation - 08/08/17 1430      Psychosocial Re-Evaluation   Current issues with  None Identified    Comments  patient continues to remain free from psychosocial barriers    Expected Outcomes  patient will remain free from psychosocial barriers to participation in pulmonary rehab    Interventions  Encouraged to attend Pulmonary Rehabilitation for the exercise    Continue Psychosocial Services   No Follow up required       Education: Education Goals: Education classes will be provided on a weekly basis, covering required topics. Participant will state understanding/return demonstration of topics presented.  Learning Barriers/Preferences:   Education Topics: Risk Factor Reduction:  -Group instruction that is supported by a PowerPoint presentation. Instructor discusses the definition of a risk factor, different risk factors for pulmonary disease, and how the heart and lungs work together.     Nutrition for Pulmonary Patient:  -Group instruction provided by PowerPoint slides, verbal discussion, and written materials to support subject matter. The instructor gives an explanation and review of healthy diet recommendations, which includes a discussion on weight management, recommendations for fruit and vegetable consumption, as well as protein, fluid, caffeine, fiber, sodium, sugar, and alcohol. Tips for eating when patients are short of breath are discussed.   PULMONARY REHAB OTHER  RESPIRATORY from 08/03/2017 in [place]  Date  06/22/17  Educator  Nutritionist  Instruction Review Code  R- Review/reinforce      Pursed Lip Breathing:  -Group instruction that is supported by demonstration and informational handouts. Instructor discusses the benefits of pursed lip and diaphragmatic breathing and detailed  demonstration on how to preform both.     Oxygen Safety:  -Group instruction provided by PowerPoint, verbal discussion, and written material to support subject matter. There is an overview of "What is Oxygen" and "Why do we need it".  Instructor also reviews how to create a safe environment for oxygen use, the importance of using oxygen as prescribed, and the risks of noncompliance. There is a brief discussion on traveling with oxygen and resources the patient may utilize.   PULMONARY REHAB OTHER RESPIRATORY from 08/03/2017 in [place]  Date  06/29/17  Educator  Truddie Crumble  Instruction Review Code  2- meets goals/outcomes      Oxygen Equipment:  -Group instruction provided by [employer] Staff utilizing handouts, written materials, and equipment demonstrations.   PULMONARY REHAB OTHER RESPIRATORY from 08/03/2017 in [place]  Date  08/03/17  Educator  Ace Gins  Instruction Review Code  2- meets goals/outcomes      Signs and Symptoms:  -Group instruction provided by written material and verbal discussion to support subject matter. Warning signs and symptoms of infection, stroke, and heart attack are reviewed and when to call the physician/911 reinforced. Tips for preventing the spread of infection discussed.   PULMONARY REHAB OTHER RESPIRATORY from 08/03/2017 in [place]  Date  05/25/17  Educator  RN  Instruction Review Code  2- meets goals/outcomes      Advanced Directives:  -Group instruction provided by verbal instruction and  written material to support subject matter. Instructor reviews Advanced Directive laws and proper instruction for filling out document.   Pulmonary Video:  -Group video education that reviews the importance of medication and oxygen compliance, exercise, good nutrition, pulmonary hygiene, and pursed lip and diaphragmatic breathing for the pulmonary patient.   PULMONARY REHAB OTHER RESPIRATORY from 08/03/2017 in [place]  Date  06/01/17  Instruction Review Code  2- meets goals/outcomes      Exercise for the Pulmonary Patient:  -Group instruction that is supported by a PowerPoint presentation. Instructor discusses benefits of exercise, core components of exercise, frequency, duration, and intensity of an exercise routine, importance of utilizing pulse oximetry during exercise, safety while exercising, and options of places to exercise outside of rehab.     PULMONARY REHAB OTHER RESPIRATORY from 08/03/2017 in [place]  Date  07/20/17  Educator  Cloyde Reams  Instruction Review Code  2- meets goals/outcomes      Pulmonary Medications:  -Verbally interactive group education provided by instructor with focus on inhaled medications and proper administration.   PULMONARY REHAB OTHER RESPIRATORY from 08/03/2017 in [place]  Date  07/06/17  Educator  Pharmacist  Instruction Review Code  R- Review/reinforce      Anatomy and Physiology of the Respiratory System and Intimacy:  -Group instruction provided by PowerPoint, verbal discussion, and written material to support subject matter. Instructor reviews respiratory cycle and anatomical components of the respiratory system and their functions. Instructor also reviews differences in obstructive and restrictive respiratory diseases with examples of each. Intimacy, Sex, and Sexuality differences are reviewed with a discussion on how relationships can change  when diagnosed with pulmonary disease. Common sexual concerns are reviewed.   MD DAY -A group question and answer session with a medical doctor that allows participants to ask questions that relate to their pulmonary disease state.   PULMONARY REHAB OTHER RESPIRATORY from 08/03/2017 in [facility]  [place]  Date  06/13/17  Educator  Nelda Marseille  Instruction Review Code  2- meets goals/outcomes      OTHER EDUCATION -Group or individual verbal, written, or video instructions that support the educational goals of the pulmonary rehab program.   PULMONARY REHAB OTHER RESPIRATORY from 08/03/2017 in [place]  Date  07/13/17  Educator  RD  Instruction Review Code  1- Verbalizes Understanding      Knowledge Questionnaire Score: Knowledge Questionnaire Score - 04/04/17 0914      Knowledge Questionnaire Score   Pre Score  13/13       Core Components/Risk Factors/Patient Goals at Admission: Personal Goals and Risk Factors at Admission - 03/27/17 1324      Core Components/Risk Factors/Patient Goals on Admission   Improve shortness of breath with ADL's  Yes    Intervention  Provide education, individualized exercise plan and daily activity instruction to help decrease symptoms of SOB with activities of daily living.    Expected Outcomes  Short Term: Achieves a reduction of symptoms when performing activities of daily living.    Develop more efficient breathing techniques such as purse lipped breathing and diaphragmatic breathing; and practicing self-pacing with activity  Yes    Intervention  Provide education, demonstration and support about specific breathing techniuqes utilized for more efficient breathing. Include techniques such as pursed lipped breathing, diaphragmatic breathing and self-pacing activity.    Expected Outcomes  Short Term: Participant will be able to demonstrate and use breathing techniques as needed throughout daily  activities.    Increase knowledge of respiratory medications and ability to use respiratory devices properly   Yes    Intervention  Provide education and demonstration as needed of appropriate use of medications, inhalers, and oxygen therapy.    Expected Outcomes  Short Term: Achieves understanding of medications use. Understands that oxygen is a medication prescribed by physician. Demonstrates appropriate use of inhaler and oxygen therapy.    Heart Failure  Yes    Intervention  Provide a combined exercise and nutrition program that is supplemented with education, support and counseling about heart failure. Directed toward relieving symptoms such as shortness of breath, decreased exercise tolerance, and extremity edema.       Core Components/Risk Factors/Patient Goals Review:  Goals and Risk Factor Review    Row Name 05/03/17 1757 05/26/17 1158 06/25/17 2108 07/17/17 1421 08/08/17 1428     Core Components/Risk Factors/Patient Goals Review   Personal Goals Review  Develop more efficient breathing techniques such as purse lipped breathing and diaphragmatic breathing and practicing self-pacing with activity.;Improve shortness of breath with ADL's;Increase knowledge of respiratory medications and ability to use respiratory devices properly.;Heart Failure  Develop more efficient breathing techniques such as purse lipped breathing and diaphragmatic breathing and practicing self-pacing with activity.;Improve shortness of breath with ADL's;Increase knowledge of respiratory medications and ability to use respiratory devices properly.;Heart Failure  Develop more efficient breathing techniques such as purse lipped breathing and diaphragmatic breathing and practicing self-pacing with activity.;Improve shortness of breath with ADL's;Increase knowledge of respiratory medications and ability to use respiratory devices properly.;Heart Failure  Develop more efficient breathing techniques such as purse lipped breathing  and diaphragmatic breathing and practicing self-pacing with activity.;Improve shortness of breath with ADL's;Increase knowledge of respiratory medications and ability to use respiratory devices properly.;Heart Failure  Develop more efficient breathing techniques such as purse lipped breathing and diaphragmatic breathing and practicing self-pacing with activity.;Improve shortness of breath with ADL's;Increase knowledge of  respiratory medications and ability to use respiratory devices properly.;Heart Failure   Review  patient is doing well in pulmonary rehab however her progress is slow. she demonstrates the pursed lip breathing technique independently. she is limited to progression related to her back issues. she is in the process of scheduling injections for back pain control and is hopeful that after the injections she will be able to tolerate more physical activity at rehab and at home.  patient is doing well with seated exercises however her effort to walk is extremely poor. she is limited by back pain and injections have not helped the way she was hoping. will continue to monitor  patient continues to do well with seated exercise. She recieved back injections this past week however at her follow-up exercise session she stated she did not see much of an improvement in her pain with walking  patient continues to do well. her back is still limiting her ability to walk however she just recently walked 8 laps in 15 min which is a personal new record. She is doing well managing her heart failure weight. She has been extended to 36 sessions  patient continues to do well. her back is still limiting her ability to walk however she just recently walked 9 laps in 15 min which is a personal new record. She is doing well managing her heart failure weight. She has been extended to 36 sessions   Expected Outcomes  see admission expected outcomes  see admission expected outcomes  see admission expected outcomes  see admission  expected outcomes  see admission expected outcomes      Core Components/Risk Factors/Patient Goals at Discharge (Final Review):  Goals and Risk Factor Review - 08/08/17 1428      Core Components/Risk Factors/Patient Goals Review   Personal Goals Review  Develop more efficient breathing techniques such as purse lipped breathing and diaphragmatic breathing and practicing self-pacing with activity.;Improve shortness of breath with ADL's;Increase knowledge of respiratory medications and ability to use respiratory devices properly.;Heart Failure    Review  patient continues to do well. her back is still limiting her ability to walk however she just recently walked 9 laps in 15 min which is a personal new record. She is doing well managing her heart failure weight. She has been extended to 36 sessions    Expected Outcomes  see admission expected outcomes       ITP Comments:   Comments: patient has attended 29 sessions since admission

## 2017-08-10 NOTE — Progress Notes (Signed)
Daily Session Note  Patient Details  Name: [name] MRN: [id number] Date of Birth: 01/26/1951 Referring Provider:     Pulmonary Rehab Walk Test from 04/04/2017 in [place]  Referring Provider  Dr. Lake Bells      Encounter Date: 08/10/2017  Check In: Session Check In - 08/10/17 1030      Check-In   Location  [facility]    Staff Present  Trish Fountain, RN, Maxcine Ham, RN, BSN;Molly diVincenzo, MS, ACSM RCEP, Exercise Physiologist; Ysidro Evert, RN    Supervising physician immediately available to respond to emergencies  [facility] immediately available    Physician(s)  Dr. Starla Link    Medication changes reported      No    Fall or balance concerns reported     No    Tobacco Cessation  No Change    Warm-up and Cool-down  Performed as group-led instruction    Resistance Training Performed  Yes    VAD Patient?  No      Pain Assessment   Currently in Pain?  No/denies    Multiple Pain Sites  No       Capillary Blood Glucose: No results found for this or any previous visit (from the past 24 hour(s)).  Exercise Prescription Changes - 08/10/17 1200      Response to Exercise   Blood Pressure (Admit)  106/62    Blood Pressure (Exercise)  128/70    Blood Pressure (Exit)  98/50    Heart Rate (Admit)  78 bpm    Heart Rate (Exercise)  85 bpm    Heart Rate (Exit)  73 bpm    Oxygen Saturation (Admit)  99 %    Oxygen Saturation (Exercise)  96 %    Oxygen Saturation (Exit)  98 %    Rating of Perceived Exertion (Exercise)  13    Perceived Dyspnea (Exercise)  2    Duration  Continue with 45 min of aerobic exercise without signs/symptoms of physical distress.    Intensity  THRR unchanged      Progression   Progression  Continue to progress workloads to maintain intensity without signs/symptoms of physical distress.      Resistance Training   Training Prescription  Yes    Weight  orange bands    Reps  10-15    Time  10  Minutes      Oxygen   Oxygen  Continuous    Liters  2-4      NuStep   Level  6    Minutes  17    METs  1.5      Track   Laps  8    Minutes  17       Social History   Tobacco Use  Smoking Status Never Smoker  Smokeless Tobacco Never Used    Goals Met:  Exercise tolerated well No report of cardiac concerns or symptoms Strength training completed today  Goals Unmet:  Not Applicable  Comments: Service time is from 1030 to 1230    Dr. Rush Farmer is Medical Director for Pulmonary Rehab at [facility].

## 2017-08-15 ENCOUNTER — Encounter (HOSPITAL_COMMUNITY): Payer: PPO

## 2017-08-17 ENCOUNTER — Encounter (HOSPITAL_COMMUNITY)
Admission: RE | Admit: 2017-08-17 | Discharge: 2017-08-17 | Disposition: A | Discharge status: Home / Self Care | Payer: PPO | Source: Ambulatory Visit | Attending: Pulmonary Disease | Admitting: Pulmonary Disease

## 2017-08-17 VITALS — Wt 222.0 lb

## 2017-08-17 DIAGNOSIS — J9611 Chronic respiratory failure with hypoxia: Secondary | ICD-10-CM

## 2017-08-17 NOTE — Progress Notes (Signed)
Daily Session Note  Patient Details  Name: [name] MRN: [id number] Date of Birth: 09-21-1950 Referring Provider:     Pulmonary Rehab Walk Test from 04/04/2017 in [place]  Referring Provider  Dr. Lake Bells      Encounter Date: 08/17/2017  Check In: Session Check In - 08/17/17 1017      Check-In   Location  [facility]    Staff Present  Su Hilt, MS, ACSM RCEP, Exercise Physiologist;Portia Rollene Rotunda, RN, Maxcine Ham, RN, BSN    Supervising physician immediately available to respond to emergencies  [facility] immediately available    Physician(s)  Dr. Starla Link    Medication changes reported      No    Fall or balance concerns reported     No    Tobacco Cessation  No Change    Warm-up and Cool-down  Performed as group-led instruction    Resistance Training Performed  Yes    VAD Patient?  No      Pain Assessment   Currently in Pain?  No/denies    Multiple Pain Sites  No       Capillary Blood Glucose: No results found for this or any previous visit (from the past 24 hour(s)).  Exercise Prescription Changes - 08/17/17 1200      Response to Exercise   Blood Pressure (Admit)  98/56    Blood Pressure (Exercise)  118/70    Blood Pressure (Exit)  104/60    Heart Rate (Admit)  82 bpm    Heart Rate (Exercise)  96 bpm    Heart Rate (Exit)  80 bpm    Oxygen Saturation (Admit)  100 %    Oxygen Saturation (Exercise)  97 %    Oxygen Saturation (Exit)  98 %    Rating of Perceived Exertion (Exercise)  14    Perceived Dyspnea (Exercise)  1    Duration  Continue with 45 min of aerobic exercise without signs/symptoms of physical distress.    Intensity  THRR unchanged      Progression   Progression  Continue to progress workloads to maintain intensity without signs/symptoms of physical distress.      Resistance Training   Training Prescription  Yes    Weight  orange bands    Reps  10-15    Time  10 Minutes      Oxygen   Oxygen  Continuous    Liters  2-4      NuStep   Level  6    Minutes  34    METs  1.5      Track   Laps  10    Minutes  17       Social History   Tobacco Use  Smoking Status Never Smoker  Smokeless Tobacco Never Used    Goals Met:  Exercise tolerated well No report of cardiac concerns or symptoms Strength training completed today  Goals Unmet:  Not Applicable  Comments: Service time is from 10:30a to 12:05p    Dr. Rush Farmer is Medical Director for Pulmonary Rehab at [facility].

## 2017-08-21 ENCOUNTER — Other Ambulatory Visit: Payer: Self-pay | Admitting: Internal Medicine

## 2017-08-21 MED ORDER — LEVOTHYROXINE SODIUM 75 MCG PO TABS: 75.0000 ug | ORAL_TABLET | Freq: Every day | ORAL | 0 refills | Status: DC

## 2017-08-22 ENCOUNTER — Encounter (HOSPITAL_COMMUNITY): Payer: PPO

## 2017-08-22 ENCOUNTER — Telehealth (HOSPITAL_COMMUNITY): Payer: Self-pay | Admitting: Internal Medicine

## 2017-08-23 DIAGNOSIS — J9610 Chronic respiratory failure, unspecified whether with hypoxia or hypercapnia: Secondary | ICD-10-CM | POA: Diagnosis not present

## 2017-08-23 DIAGNOSIS — R269 Unspecified abnormalities of gait and mobility: Secondary | ICD-10-CM | POA: Diagnosis not present

## 2017-08-23 DIAGNOSIS — G4733 Obstructive sleep apnea (adult) (pediatric): Secondary | ICD-10-CM | POA: Diagnosis not present

## 2017-08-23 DIAGNOSIS — I5040 Unspecified combined systolic (congestive) and diastolic (congestive) heart failure: Secondary | ICD-10-CM | POA: Diagnosis not present

## 2017-08-24 ENCOUNTER — Encounter (HOSPITAL_COMMUNITY)
Admission: RE | Admit: 2017-08-24 | Discharge: 2017-08-24 | Disposition: A | Discharge status: Home / Self Care | Payer: PPO | Source: Ambulatory Visit | Attending: Pulmonary Disease | Admitting: Pulmonary Disease

## 2017-08-24 VITALS — Wt 222.4 lb

## 2017-08-24 DIAGNOSIS — J9611 Chronic respiratory failure with hypoxia: Secondary | ICD-10-CM | POA: Diagnosis not present

## 2017-08-24 NOTE — Progress Notes (Signed)
Daily Session Note  Patient Details  Name: [name] MRN: [id number] Date of Birth: September 01, 1951 Referring Provider:     Pulmonary Rehab Walk Test from 04/04/2017 in [place]  Referring Provider  Dr. Lake Bells      Encounter Date: 08/24/2017  Check In: Session Check In - 08/24/17 1125      Check-In   Location  [facility]    Staff Present  Su Hilt, MS, ACSM RCEP, Exercise Physiologist;Portia Rollene Rotunda, RN, Maxcine Ham, RN, Roque Cash, RN    Supervising physician immediately available to respond to emergencies  [facility] immediately available    Physician(s)  Dr. Wendee Beavers    Medication changes reported      No    Fall or balance concerns reported     No    Tobacco Cessation  No Change    Warm-up and Cool-down  Performed as group-led instruction    Resistance Training Performed  Yes    VAD Patient?  No      Pain Assessment   Currently in Pain?  No/denies    Multiple Pain Sites  No       Capillary Blood Glucose: No results found for this or any previous visit (from the past 24 hour(s)).  Exercise Prescription Changes - 08/24/17 1300      Response to Exercise   Blood Pressure (Admit)  120/60    Blood Pressure (Exercise)  110/50    Blood Pressure (Exit)  108/64    Heart Rate (Admit)  64 bpm    Heart Rate (Exercise)  76 bpm    Heart Rate (Exit)  64 bpm    Oxygen Saturation (Admit)  100 %    Oxygen Saturation (Exercise)  97 %    Oxygen Saturation (Exit)  100 %    Rating of Perceived Exertion (Exercise)  11    Perceived Dyspnea (Exercise)  0    Duration  Continue with 45 min of aerobic exercise without signs/symptoms of physical distress.    Intensity  THRR unchanged      Progression   Progression  Continue to progress workloads to maintain intensity without signs/symptoms of physical distress.      Resistance Training   Training Prescription  Yes    Weight  orange bands    Reps  10-15    Time   10 Minutes      Oxygen   Oxygen  Continuous    Liters  2-4      NuStep   Level  6    Minutes  34    METs  1.7       Social History   Tobacco Use  Smoking Status Never Smoker  Smokeless Tobacco Never Used    Goals Met:  Exercise tolerated well No report of cardiac concerns or symptoms Strength training completed today  Goals Unmet:  Not Applicable  Comments: Service time is from 10:30a to 12:45p    Dr. Rush Farmer is Medical Director for Pulmonary Rehab at [facility].

## 2017-08-29 ENCOUNTER — Other Ambulatory Visit (HOSPITAL_COMMUNITY): Payer: Self-pay | Admitting: Internal Medicine

## 2017-08-30 ENCOUNTER — Other Ambulatory Visit (HOSPITAL_COMMUNITY): Payer: Self-pay | Admitting: Internal Medicine

## 2017-08-30 DIAGNOSIS — J9610 Chronic respiratory failure, unspecified whether with hypoxia or hypercapnia: Secondary | ICD-10-CM | POA: Diagnosis not present

## 2017-08-30 DIAGNOSIS — G4733 Obstructive sleep apnea (adult) (pediatric): Secondary | ICD-10-CM | POA: Diagnosis not present

## 2017-08-30 DIAGNOSIS — R269 Unspecified abnormalities of gait and mobility: Secondary | ICD-10-CM | POA: Diagnosis not present

## 2017-08-30 DIAGNOSIS — I5040 Unspecified combined systolic (congestive) and diastolic (congestive) heart failure: Secondary | ICD-10-CM | POA: Diagnosis not present

## 2017-08-31 ENCOUNTER — Encounter (HOSPITAL_COMMUNITY)
Admission: RE | Admit: 2017-08-31 | Discharge: 2017-08-31 | Disposition: A | Discharge status: Home / Self Care | Payer: PPO | Source: Ambulatory Visit | Attending: Pulmonary Disease | Admitting: Pulmonary Disease

## 2017-08-31 VITALS — Wt 224.0 lb

## 2017-08-31 DIAGNOSIS — J9611 Chronic respiratory failure with hypoxia: Secondary | ICD-10-CM

## 2017-08-31 NOTE — Telephone Encounter (Signed)
I already sent this in .   Please adviose why getting repeat requests  Or did the pharmacy not get this?

## 2017-08-31 NOTE — Progress Notes (Signed)
Daily Session Note  Patient Details  Name: [name] MRN: [id number] Date of Birth: 1951/02/10 Referring Provider:     Pulmonary Rehab Walk Test from 04/04/2017 in [place]  Referring Provider  Dr. Lake Bells      Encounter Date: 08/31/2017  Check In: Session Check In - 08/31/17 1021      Check-In   Location  [facility]    Staff Present  Rosebud Poles, RN, BSN;Lisa Ysidro Evert, RN;Portia Rollene Rotunda, RN, BSN    Supervising physician immediately available to respond to emergencies  [facility] immediately available    Physician(s)  Dr. Rockne Menghini    Medication changes reported      No    Fall or balance concerns reported     No    Tobacco Cessation  No Change    Warm-up and Cool-down  Performed as group-led instruction    Resistance Training Performed  Yes    VAD Patient?  No      Pain Assessment   Currently in Pain?  No/denies    Multiple Pain Sites  No       Capillary Blood Glucose: No results found for this or any previous visit (from the past 24 hour(s)).  Exercise Prescription Changes - 08/31/17 1200      Response to Exercise   Blood Pressure (Admit)  90/48    Blood Pressure (Exercise)  126/60    Blood Pressure (Exit)  100/56    Heart Rate (Admit)  57 bpm    Heart Rate (Exercise)  105 bpm    Heart Rate (Exit)  80 bpm    Oxygen Saturation (Admit)  99 %    Oxygen Saturation (Exercise)  99 %    Oxygen Saturation (Exit)  98 %    Rating of Perceived Exertion (Exercise)  11    Perceived Dyspnea (Exercise)  0    Duration  Continue with 45 min of aerobic exercise without signs/symptoms of physical distress.    Intensity  THRR unchanged      Progression   Progression  Continue to progress workloads to maintain intensity without signs/symptoms of physical distress.      Resistance Training   Training Prescription  Yes    Weight  orange bands    Reps  10-15    Time  10 Minutes      Oxygen   Oxygen  Continuous    Liters   2-4      NuStep   Level  6    Minutes  34    METs  1.8      Track   Laps  10    Minutes  17       Social History   Tobacco Use  Smoking Status Never Smoker  Smokeless Tobacco Never Used    Goals Met:  Exercise tolerated well Strength training completed today  Goals Unmet:  Not Applicable  Comments: Service time is from 1030 to 1215   Dr. Rush Farmer is Medical Director for Pulmonary Rehab at [facility]

## 2017-08-31 NOTE — Progress Notes (Signed)
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 signs/symptoms of physical distress.  Continue with 45 min of aerobic exercise without signs/symptoms of physical distress.  Continue with 45 min of aerobic exercise without signs/symptoms of physical distress.   Intensity  THRR unchanged  THRR unchanged  THRR unchanged  THRR unchanged  THRR unchanged     Progression   Progression  Continue to progress workloads to maintain intensity without signs/symptoms of physical distress.  Continue to progress workloads to maintain intensity without signs/symptoms of physical distress.  Continue to progress workloads to maintain intensity without signs/symptoms of physical  distress.  Continue to progress workloads to maintain intensity without signs/symptoms of physical distress.  Continue to progress workloads to maintain intensity without signs/symptoms of physical distress.     Resistance Training   Training Prescription  Yes  Yes  Yes  Yes  Yes   Weight  orange bands  orange bands  orange bands  orange bands  orange bands   Reps  10-15  10-15  10-15  10-15  10-15   Time  10 Minutes  10 Minutes  10 Minutes  10 Minutes  10 Minutes     Oxygen   Oxygen  Continuous  Continuous  Continuous  Continuous  Continuous   Liters  2.4  2.4  2.4  2-4  2.4     NuStep   Level  _4 Minutes  _5 METs  1.8  1.7  1.6  1.6  1.5     Track   Laps  _6 Minutes  17  _0 Row Name 07/06/17 1243 07/06/17 1300 07/11/17 1200 07/13/17 1300 07/18/17 1200     Response to Exercise   Blood Pressure (Admit)  90/40  -  100/50  100/48  100/50   Blood Pressure (Exercise)  110/50  -  98/50  118/60  110/60   Blood Pressure (Exit)  105/60  -  100/60  104/72  104/54   Heart Rate (Admit)  59 bpm  -  63 bpm  73 bpm  54 bpm   Heart Rate (Exercise)  114 bpm  -  111 bpm  96 bpm  69 bpm   Heart Rate (Exit)  95 bpm  -  66 bpm  62 bpm  81 bpm   Oxygen Saturation (Admit)  100 %  -  100 %  97 %  99 %   Oxygen Saturation (Exercise)  94 %  -  94 %  97 %  95 %   Oxygen Saturation (Exit)  98 %  -  93 %  96 %  98 %   Rating of Perceived Exertion (Exercise)  15  -  _1 Perceived Dyspnea (Exercise)  3  -  _2 Duration  Continue with 45 min of aerobic exercise without signs/symptoms of physical distress.  -  Continue with 45 min of aerobic exercise without signs/symptoms of physical distress.  Continue with 45 min of aerobic exercise without signs/symptoms of physical distress.  Continue with 45 min of aerobic exercise without signs/symptoms of physical distress.   Intensity  THRR unchanged  -  THRR unchanged  THRR unchanged  THRR  unchanged     Progression   Progression  Continue to progress workloads to maintain intensity without signs/symptoms of physical distress.  -  Continue to progress workloads to maintain intensity without signs/symptoms of physical distress.  Continue to progress workloads to maintain intensity without signs/symptoms of physical distress.  Continue to progress workloads to maintain intensity without signs/symptoms of physical distress.     Resistance Training   Training Prescription  Yes  -  Yes  Yes  Yes   Weight  orange bands  -  orange bands  orange bands  orange bands   Reps  10-15  -  10-15  10-15  10-15   Time  10 Minutes  -  10 Minutes  10 Minutes  10 Minutes     Oxygen   Oxygen  Continuous  -  Continuous  Continuous  Continuous   Liters  2.4  -  2.4  2.4  2.4     NuStep   Level  5  -  _3 Minutes  17  -  34  17  34   METs  1.2  -  1.5  1.5  1.5     Track   Laps  7  -  _4 Minutes  17  -  _5 Home Exercise Plan   Plans to continue exercise at  -  Home (comment)  -  -  -   Frequency  -  Add 2 additional days to program exercise sessions.  -  -  -   Row Name 07/20/17 1200 07/25/17 1200 08/01/17 1200 08/03/17 1300 08/10/17 1200     Response to Exercise   Blood Pressure (Admit)  100/64  100/48  102/40  106/54  106/62   Blood Pressure (Exercise)  110/60  108/74  118/64  130/70  128/70   Blood Pressure (Exit)  96/50  114/70  96/54  104/60  98/50   Heart Rate (Admit)  57 bpm  61 bpm  56 bpm  66 bpm  78 bpm   Heart Rate (Exercise)  94 bpm  96 bpm  103 bpm  92 bpm  85 bpm   Heart Rate (Exit)  68 bpm  68 bpm  62 bpm  78 bpm  73 bpm   Oxygen Saturation (Admit)  99 %  100 %  100 %  96 %  99 %   Oxygen Saturation (Exercise)  96 %  98 %  96 %  97 %  96 %   Oxygen Saturation (Exit)  97 %  97 %  100 %  98 %  98 %   Rating of Perceived Exertion (Exercise)  _0 Perceived Dyspnea (Exercise)  _1 Duration  Continue with 45 min of aerobic  exercise without signs/symptoms of physical distress.  Continue with 45 min of aerobic exercise without signs/symptoms of physical distress.  Continue with 45 min of aerobic exercise without signs/symptoms of physical distress.  Continue with 45 min of aerobic exercise without signs/symptoms of physical distress.  Continue with 45 min of aerobic exercise without signs/symptoms of physical distress.   Intensity  THRR unchanged  THRR unchanged  THRR unchanged  THRR unchanged  THRR unchanged     Progression   Progression  Continue to progress workloads to maintain intensity without signs/symptoms of physical distress.  Continue to progress workloads to maintain intensity without signs/symptoms of physical distress.  Continue to progress workloads to maintain intensity without signs/symptoms of physical distress.  Continue to progress workloads to maintain intensity without signs/symptoms of physical distress.  Continue to progress workloads to maintain intensity without signs/symptoms of physical distress.     Resistance Training   Training Prescription  Yes  Yes  Yes  Yes  Yes   Weight  orange bands  orange bands  orange bands  orange bands  orange bands   Reps  10-15  10-15  10-15  10-15  10-15   Time  10 Minutes  10 Minutes  10 Minutes  10 Minutes  10 Minutes     Oxygen   Oxygen  Continuous  Continuous  Continuous  Continuous  Continuous   Liters  2.4  2.4  2-4  2-4  2-4     NuStep   Level  _2 Minutes  17  25  34  17  17   METs  1.7  1.5  1.7  1.6  1.5     Track   Laps  _3 Minutes  _4 Row Name 08/17/17 1200 08/24/17 1300           Response to Exercise   Blood Pressure (Admit)  98/56  120/60      Blood Pressure (Exercise)  118/70  110/50      Blood Pressure (Exit)  104/60  108/64      Heart Rate (Admit)  82 bpm  64 bpm      Heart  Rate (Exercise)  96 bpm  76 bpm      Heart Rate (Exit)  80 bpm  64 bpm      Oxygen Saturation (Admit)  100 %   100 %      Oxygen Saturation (Exercise)  97 %  97 %      Oxygen Saturation (Exit)  98 %  100 %      Rating of Perceived Exertion (Exercise)  14  11      Perceived Dyspnea (Exercise)  1  0      Duration  Continue with 45 min of aerobic exercise without signs/symptoms of physical distress.  Continue with 45 min of aerobic exercise without signs/symptoms of physical distress.      Intensity  THRR unchanged  THRR unchanged        Progression   Progression  Continue to progress workloads to maintain intensity without signs/symptoms of physical distress.  Continue to progress workloads to maintain intensity without signs/symptoms of physical distress.        Resistance Training   Training Prescription  Yes  Yes      Weight  orange bands  orange bands      Reps  10-15  10-15      Time  10 Minutes  10 Minutes        Oxygen   Oxygen  Continuous  Continuous      Liters  2-4  2-4        NuStep   Level  6  6      Minutes  34  34      METs  1.5  1.7        Track   Laps  10  -      Minutes  17  -         Exercise Comments: Exercise Comments    Row Name 07/06/17 1341           Exercise Comments  home exercise completed          Exercise Goals and Review:   Exercise Goals Re-Evaluation : Exercise Goals Re-Evaluation    Row Name 05/02/17 8527 05/22/17 1217 06/26/17 0725 07/20/17 1547 08/08/17 0757     Exercise Goal Re-Evaluation   Exercise Goals Review  Increase Physical Activity;Increase Strength and Stamina;Able to understand and use Dyspnea scale;Able to understand and use rate of perceived exertion (RPE) scale;Knowledge and understanding of Target Heart Rate Range (THRR);Understanding of Exercise Prescription  Able to understand and use Dyspnea scale;Increase Strength and Stamina;Increase Physical Activity;Able to understand and use rate of perceived exertion (RPE) scale;Knowledge and understanding of Target Heart Rate Range (THRR);Understanding of Exercise Prescription   Increase Physical Activity;Increase Strength and Stamina;Able to understand and use Dyspnea scale;Able to understand and use rate of perceived exertion (RPE) scale;Knowledge and understanding of Target Heart Rate Range (THRR);Understanding of Exercise Prescription  Increase Strength and Stamina;Increase Physical Activity;Able to understand and use Dyspnea scale;Able to understand and use rate of perceived exertion (RPE) scale;Knowledge and understanding of Target Heart Rate Range (THRR);Understanding of Exercise Prescription  Increase Strength and Stamina;Increase Physical Activity;Able to understand and use Dyspnea scale;Able to understand and use rate of perceived exertion (RPE) scale;Knowledge and understanding of Target Heart Rate Range (THRR);Understanding of Exercise Prescription   Comments  Patient has had a slow start with the physical activity. She is limited by her orthopedic issues. Progression will need to be low and slow for this patient. Her effort wavers--will cont. to  monitor and progress as able.   Patient has had a slow start with the physical activity. She is limited by her orthopedic issues. Progression will need to be low and slow for this patient. Her effort wavers--will cont. to monitor and progress as able.   Patient is slowly progressing. She states that she is beginning to feel as though she is making improvements. Has chronic pain that limits her. Will extend her rehab time out to 36 sessions. Walking 4-5 laps (200 ft each) in 15 minutes.   Patient is slowly progressing. Her attendance is very consistent. She is motivated to work hard but is limited by her orthopedic issues and chronic pain. She is able to walk 8 laps (200 ft. each) in 15 minutes using an assistive device. MET level still falls in the "low" range. We have extended her stay at [facility] so she has the chance to make additional improvements.  Patient is slowly progressing. Her attendance is very consistent. She is  motivated to work hard but is limited by her orthopedic issues and chronic pain. She is able to walk 8 laps (200 ft. each) in 15 minutes using an assistive device. MET level still falls in the "low" range. We have extended her stay at [facility] so she has the chance to make additional improvements.   Expected Outcomes  Through exercise and education at rehab and at home, patient will increase strength and stamina. Patient will also gain a better understanding of the need for physical activity on a daily basis and the effects it can have on quality of life.   Through exercise and education at rehab and at home, patient will increase strength and stamina. Patient will also gain a better understanding of the need for physical activity on a daily basis and the effects it can have on quality of life.   Through exercise and education at rehab and at home, patient will increase strength and stamina. Patient will also gain a better understanding of the need for physical activity on a daily basis and the effects it can have on quality of life.   Through exercise at rehab and at home, patient will increase strength and stamina making ADL's easier to perform. Patient will also have a better understanding of safe exercise and what they are capable to do outside of clinical supervision.  Through exercise at rehab and at home, patient will increase strength and stamina making ADL's easier to perform. Patient will also have a better understanding of safe exercise and what they are capable to do outside of clinical supervision.   [place] Name 08/24/17 0732             Exercise Goal Re-Evaluation   Exercise Goals Review  Increase Strength and Stamina;Increase Physical Activity;Able to understand and use Dyspnea scale;Able to understand and use rate of perceived exertion (RPE) scale;Knowledge and understanding of Target Heart Rate Range (THRR);Understanding of Exercise Prescription       Comments  Patient is slowly  progressing. Her attendance is very consistent. She is motivated to work hard but is limited by her orthopedic issues and chronic pain. She is able to walk 8 laps (200 ft. each) in 15 minutes using an assistive device. MET level still falls in the "low" range. We have extended her stay at [facility]hab so she has the chance to make additional improvements.       Expected Outcomes  Through exercise at rehab and at home, patient will increase strength and  stamina making ADL's easier to perform. Patient will also have a better understanding of safe exercise and what they are capable to do outside of clinical supervision.          Discharge Exercise Prescription (Final Exercise Prescription Changes): Exercise Prescription Changes - 08/24/17 1300      Response to Exercise   Blood Pressure (Admit)  120/60    Blood Pressure (Exercise)  110/50    Blood Pressure (Exit)  108/64    Heart Rate (Admit)  64 bpm    Heart Rate (Exercise)  76 bpm    Heart Rate (Exit)  64 bpm    Oxygen Saturation (Admit)  100 %    Oxygen Saturation (Exercise)  97 %    Oxygen Saturation (Exit)  100 %    Rating of Perceived Exertion (Exercise)  11    Perceived Dyspnea (Exercise)  0    Duration  Continue with 45 min of aerobic exercise without signs/symptoms of physical distress.    Intensity  THRR unchanged      Progression   Progression  Continue to progress workloads to maintain intensity without signs/symptoms of physical distress.      Resistance Training   Training Prescription  Yes    Weight  orange bands    Reps  10-15    Time  10 Minutes      Oxygen   Oxygen  Continuous    Liters  2-4      NuStep   Level  6    Minutes  34    METs  1.7       Nutrition:  Target Goals: Understanding of nutrition guidelines, daily intake of sodium <1582m, cholesterol <2082m calories 30% from fat and 7% or less from saturated fats, daily to have 5 or more servings of fruits and vegetables.  Biometrics: Pre  Biometrics - 03/27/17 1318      Pre Biometrics   Grip Strength  16 kg        Nutrition Therapy Plan and Nutrition Goals: Nutrition Therapy & Goals - 05/01/17 1500      Nutrition Therapy   Diet  TLC      Personal Nutrition Goals   Nutrition Goal  Identify food quantities necessary to achieve wt loss of 1-2 lb per week to a goal wt loss of 6-24 lb at graduation from pulmonary rehab.    Personal Goal #2  Pt to be able to name foods that affect blood glucose. Pt's A1c was 6.1 01/25/16.      Intervention Plan   Intervention  Prescribe, educate and counsel regarding individualized specific dietary modifications aiming towards targeted core components such as weight, hypertension, lipid management, diabetes, heart failure and other comorbidities.    Expected Outcomes  Short Term Goal: Understand basic principles of dietary content, such as calories, fat, sodium, cholesterol and nutrients.;Long Term Goal: Adherence to prescribed nutrition plan.       Nutrition Discharge: Rate Your Plate Scores: Nutrition Assessments - 05/01/17 1455      Rate Your Plate Scores   Pre Score  57       Nutrition Goals Re-Evaluation:   Nutrition Goals Discharge (Final Nutrition Goals Re-Evaluation):   Psychosocial: Target Goals: Acknowledge presence or absence of significant depression and/or stress, maximize coping skills, provide positive support system. Participant is able to verbalize types and ability to use techniques and skills needed for reducing stress and depression.  Initial Review & Psychosocial Screening: Initial Psych Review & Screening - 03/27/17  1325      Initial Review   Current issues with  None Identified      Family Dynamics   Good Support System?  Yes      Barriers   Psychosocial barriers to participate in program  There are no identifiable barriers or psychosocial needs.      Screening Interventions   Interventions  Encouraged to exercise       Quality of Life  Scores:   PHQ-9: Recent Review Flowsheet Data    Depression screen [facility] 2/9 03/27/2017 12/27/2016 04/22/2015   Decreased Interest 0 1 0   Down, Depressed, Hopeless 0 1 1   PHQ - 2 Score 0 2 1     Interpretation of Total Score  Total Score Depression Severity:  1-4 = Minimal depression, 5-9 = Mild depression, 10-14 = Moderate depression, 15-19 = Moderately severe depression, 20-27 = Severe depression   Psychosocial Evaluation and Intervention: Psychosocial Evaluation - 03/27/17 1326      Psychosocial Evaluation & Interventions   Interventions  Encouraged to exercise with the program and follow exercise prescription    Continue Psychosocial Services   No Follow up required       Psychosocial Re-Evaluation: Psychosocial Re-Evaluation    [place] Name 05/03/17 1801 05/26/17 1159 06/25/17 2110 07/17/17 1429 08/08/17 1430     Psychosocial Re-Evaluation   Current issues with  None Identified  None Identified  None Identified  None Identified  None Identified   Comments  -  -  patient continues to remain free from psychosocial barriers  patient continues to remain free from psychosocial barriers  patient continues to remain free from psychosocial barriers   Expected Outcomes  patient will remain free from psychosocial barriers to participation in pulmonary rehab  patient will remain free from psychosocial barriers to participation in pulmonary rehab  patient will remain free from psychosocial barriers to participation in pulmonary rehab  patient will remain free from psychosocial barriers to participation in pulmonary rehab  patient will remain free from psychosocial barriers to participation in pulmonary rehab   Interventions  Encouraged to attend Pulmonary Rehabilitation for the exercise  Encouraged to attend Pulmonary Rehabilitation for the exercise  Encouraged to attend Pulmonary Rehabilitation for the exercise  Encouraged to attend Pulmonary Rehabilitation for the exercise  Encouraged to attend  Pulmonary Rehabilitation for the exercise   Continue Psychosocial Services   No Follow up required  No Follow up required  No Follow up required  No Follow up required  No Follow up required   [facility] Name 08/31/17 0844             Psychosocial Re-Evaluation   Current issues with  None Identified       Comments  patient continues to remain free from psychosocial barriers       Expected Outcomes  patient will remain free from psychosocial barriers to participation in pulmonary rehab       Interventions  Encouraged to attend Pulmonary Rehabilitation for the exercise       Continue Psychosocial Services   No Follow up required          Psychosocial Discharge (Final Psychosocial Re-Evaluation): Psychosocial Re-Evaluation - 08/31/17 0844      Psychosocial Re-Evaluation   Current issues with  None Identified    Comments  patient continues to remain free from psychosocial barriers    Expected Outcomes  patient will remain free from psychosocial barriers to participation in pulmonary rehab    Interventions  Encouraged to attend Pulmonary Rehabilitation for the exercise    Continue Psychosocial Services   No Follow up required       Education: Education Goals: Education classes will be provided on a weekly basis, covering required topics. Participant will state understanding/return demonstration of topics presented.  Learning Barriers/Preferences:   Education Topics: Risk Factor Reduction:  -Group instruction that is supported by a PowerPoint presentation. Instructor discusses the definition of a risk factor, different risk factors for pulmonary disease, and how the heart and lungs work together.     Nutrition for Pulmonary Patient:  -Group instruction provided by PowerPoint slides, verbal discussion, and written materials to support subject matter. The instructor gives an explanation and review of healthy diet recommendations, which includes a discussion on weight management,  recommendations for fruit and vegetable consumption, as well as protein, fluid, caffeine, fiber, sodium, sugar, and alcohol. Tips for eating when patients are short of breath are discussed.   PULMONARY REHAB OTHER RESPIRATORY from 08/24/2017 in [place]  Date  06/22/17  Educator  Nutritionist  Instruction Review Code  R- Review/reinforce      Pursed Lip Breathing:  -Group instruction that is supported by demonstration and informational handouts. Instructor discusses the benefits of pursed lip and diaphragmatic breathing and detailed demonstration on how to preform both.     Oxygen Safety:  -Group instruction provided by PowerPoint, verbal discussion, and written material to support subject matter. There is an overview of "What is Oxygen" and "Why do we need it".  Instructor also reviews how to create a safe environment for oxygen use, the importance of using oxygen as prescribed, and the risks of noncompliance. There is a brief discussion on traveling with oxygen and resources the patient may utilize.   PULMONARY REHAB OTHER RESPIRATORY from 08/24/2017 in [place]  Date  06/29/17  Educator  Truddie Crumble  Instruction Review Code  2- meets goals/outcomes      Oxygen Equipment:  -Group instruction provided by [employer] Staff utilizing handouts, written materials, and equipment demonstrations.   PULMONARY REHAB OTHER RESPIRATORY from 08/24/2017 in [place]  Date  08/03/17  Educator  Ace Gins  Instruction Review Code  2- meets goals/outcomes      Signs and Symptoms:  -Group instruction provided by written material and verbal discussion to support subject matter. Warning signs and symptoms of infection, stroke, and heart attack are reviewed and when to call the physician/911 reinforced. Tips for preventing the spread of infection discussed.   PULMONARY REHAB OTHER RESPIRATORY from 08/24/2017 in  [place]  Date  05/25/17  Educator  RN  Instruction Review Code  2- meets goals/outcomes      Advanced Directives:  -Group instruction provided by verbal instruction and written material to support subject matter. Instructor reviews Advanced Directive laws and proper instruction for filling out document.   Pulmonary Video:  -Group video education that reviews the importance of medication and oxygen compliance, exercise, good nutrition, pulmonary hygiene, and pursed lip and diaphragmatic breathing for the pulmonary patient.   PULMONARY REHAB OTHER RESPIRATORY from 08/24/2017 in [place]  Date  06/01/17  Instruction Review Code  2- meets goals/outcomes      Exercise for the Pulmonary Patient:  -Group instruction that is supported by a PowerPoint presentation. Instructor discusses benefits of exercise, core components of exercise, frequency, duration, and intensity of an exercise routine, importance of  utilizing pulse oximetry during exercise, safety while exercising, and options of places to exercise outside of rehab.     PULMONARY REHAB OTHER RESPIRATORY from 08/24/2017 in [place]  Date  07/20/17  Educator  Cloyde Reams  Instruction Review Code  2- meets goals/outcomes      Pulmonary Medications:  -Verbally interactive group education provided by instructor with focus on inhaled medications and proper administration.   PULMONARY REHAB OTHER RESPIRATORY from 08/24/2017 in [place]  Date  07/06/17  Educator  Pharmacist  Instruction Review Code  R- Review/reinforce      Anatomy and Physiology of the Respiratory System and Intimacy:  -Group instruction provided by PowerPoint, verbal discussion, and written material to support subject matter. Instructor reviews respiratory cycle and anatomical components of the respiratory system and their functions.  Instructor also reviews differences in obstructive and restrictive respiratory diseases with examples of each. Intimacy, Sex, and Sexuality differences are reviewed with a discussion on how relationships can change when diagnosed with pulmonary disease. Common sexual concerns are reviewed.   PULMONARY REHAB OTHER RESPIRATORY from 08/24/2017 in [place]  Date  08/24/17  Educator  RN  Instruction Review Code  2- meets goals/outcomes      MD DAY -A group question and answer session with a medical doctor that allows participants to ask questions that relate to their pulmonary disease state.   PULMONARY REHAB OTHER RESPIRATORY from 08/24/2017 in [place]  Date  06/13/17  Educator  Nelda Marseille  Instruction Review Code  2- meets goals/outcomes      OTHER EDUCATION -Group or individual verbal, written, or video instructions that support the educational goals of the pulmonary rehab program.   PULMONARY REHAB OTHER RESPIRATORY from 08/24/2017 in [place]  Date  07/13/17  Educator  RD  Instruction Review Code  1- Verbalizes Understanding      Knowledge Questionnaire Score: Knowledge Questionnaire Score - 04/04/17 0914      Knowledge Questionnaire Score   Pre Score  13/13       Core Components/Risk Factors/Patient Goals at Admission: Personal Goals and Risk Factors at Admission - 03/27/17 1324      Core Components/Risk Factors/Patient Goals on Admission   Improve shortness of breath with ADL's  Yes    Intervention  Provide education, individualized exercise plan and daily activity instruction to help decrease symptoms of SOB with activities of daily living.    Expected Outcomes  Short Term: Achieves a reduction of symptoms when performing activities of daily living.    Develop more efficient breathing techniques such as purse lipped breathing and diaphragmatic breathing; and practicing  self-pacing with activity  Yes    Intervention  Provide education, demonstration and support about specific breathing techniuqes utilized for more efficient breathing. Include techniques such as pursed lipped breathing, diaphragmatic breathing and self-pacing activity.    Expected Outcomes  Short Term: Participant will be able to demonstrate and use breathing techniques as needed throughout daily activities.    Increase knowledge of respiratory medications and ability to use respiratory devices properly   Yes    Intervention  Provide education and demonstration as needed of appropriate use of medications, inhalers, and oxygen therapy.    Expected Outcomes  Short Term: Achieves understanding of medications use. Understands that oxygen is a medication prescribed by physician. Demonstrates appropriate use of inhaler and oxygen therapy.    Heart Failure  Yes    Intervention  Provide a combined exercise and nutrition program that is supplemented with education, support and counseling about heart failure. Directed toward relieving symptoms such as shortness of breath, decreased exercise tolerance, and extremity edema.       Core Components/Risk Factors/Patient Goals Review:  Goals and Risk Factor Review    Row Name 05/03/17 1757 05/26/17 1158 06/25/17 2108 07/17/17 1421 08/08/17 1428     Core Components/Risk Factors/Patient Goals Review   Personal Goals Review  Develop more efficient breathing techniques such as purse lipped breathing and diaphragmatic breathing and practicing self-pacing with activity.;Improve shortness of breath with ADL's;Increase knowledge of respiratory medications and ability to use respiratory devices properly.;Heart Failure  Develop more efficient breathing techniques such as purse lipped breathing and diaphragmatic breathing and practicing self-pacing with activity.;Improve shortness of breath with ADL's;Increase knowledge of respiratory medications and ability to use respiratory  devices properly.;Heart Failure  Develop more efficient breathing techniques such as purse lipped breathing and diaphragmatic breathing and practicing self-pacing with activity.;Improve shortness of breath with ADL's;Increase knowledge of respiratory medications and ability to use respiratory devices properly.;Heart Failure  Develop more efficient breathing techniques such as purse lipped breathing and diaphragmatic breathing and practicing self-pacing with activity.;Improve shortness of breath with ADL's;Increase knowledge of respiratory medications and ability to use respiratory devices properly.;Heart Failure  Develop more efficient breathing techniques such as purse lipped breathing and diaphragmatic breathing and practicing self-pacing with activity.;Improve shortness of breath with ADL's;Increase knowledge of respiratory medications and ability to use respiratory devices properly.;Heart Failure   Review  patient is doing well in pulmonary rehab however her progress is slow. she demonstrates the pursed lip breathing technique independently. she is limited to progression related to her back issues. she is in the process of scheduling injections for back pain control and is hopeful that after the injections she will be able to tolerate more physical activity at rehab and at home.  patient is doing well with seated exercises however her effort to walk is extremely poor. she is limited by back pain and injections have not helped the way she was hoping. will continue to monitor  patient continues to do well with seated exercise. She recieved back injections this past week however at her follow-up exercise session she stated she did not see much of an improvement in her pain with walking  patient continues to do well. her back is still limiting her ability to walk however she just recently walked 8 laps in 15 min which is a personal new record. She is doing well managing her heart failure weight. She has been  extended to 36 sessions  patient continues to do well. her back is still limiting her ability to walk however she just recently walked 9 laps in 15 min which is a personal new record. She is doing well managing her heart failure weight. She has been extended to 36 sessions   Expected Outcomes  see admission expected outcomes  see admission expected outcomes  see admission expected outcomes  see admission expected outcomes  see admission expected outcomes   Row Name 08/31/17 0843             Core Components/Risk Factors/Patient Goals Review   Personal Goals Review  Develop more efficient breathing techniques such as purse lipped breathing and diaphragmatic breathing and practicing self-pacing with activity.;Improve shortness of breath with ADL's;Increase knowledge of respiratory medications and ability to use respiratory devices properly.;Heart Failure  Review  patient continues to do well. her back is still limiting her ability to walk however she just recently walked 9 laps in 15 min which is a personal new record. She is doing well managing her heart failure weight. She has been extended to 36 sessions       Expected Outcomes  see admission expected outcomes          Core Components/Risk Factors/Patient Goals at Discharge (Final Review):  Goals and Risk Factor Review - 08/31/17 0843      Core Components/Risk Factors/Patient Goals Review   Personal Goals Review  Develop more efficient breathing techniques such as purse lipped breathing and diaphragmatic breathing and practicing self-pacing with activity.;Improve shortness of breath with ADL's;Increase knowledge of respiratory medications and ability to use respiratory devices properly.;Heart Failure    Review  patient continues to do well. her back is still limiting her ability to walk however she just recently walked 9 laps in 15 min which is a personal new record. She is doing well managing her heart failure weight. She has been extended  to 36 sessions    Expected Outcomes  see admission expected outcomes       ITP Comments:   Comments: Patient has attended 32 session since admission to pulmonary rehab.

## 2017-09-03 DIAGNOSIS — R269 Unspecified abnormalities of gait and mobility: Secondary | ICD-10-CM | POA: Diagnosis not present

## 2017-09-03 DIAGNOSIS — I5040 Unspecified combined systolic (congestive) and diastolic (congestive) heart failure: Secondary | ICD-10-CM | POA: Diagnosis not present

## 2017-09-03 DIAGNOSIS — J9610 Chronic respiratory failure, unspecified whether with hypoxia or hypercapnia: Secondary | ICD-10-CM | POA: Diagnosis not present

## 2017-09-07 ENCOUNTER — Encounter (HOSPITAL_COMMUNITY)
Admission: RE | Admit: 2017-09-07 | Discharge: 2017-09-07 | Disposition: A | Discharge status: Home / Self Care | Payer: PPO | Source: Ambulatory Visit | Attending: Pulmonary Disease | Admitting: Pulmonary Disease

## 2017-09-07 VITALS — Wt 220.7 lb

## 2017-09-07 DIAGNOSIS — J9611 Chronic respiratory failure with hypoxia: Secondary | ICD-10-CM

## 2017-09-07 NOTE — Progress Notes (Signed)
Daily Session Note  Patient Details  Name: [name] MRN: [id number] Date of Birth: 1951/08/23 Referring Provider:     Pulmonary Rehab Walk Test from 04/04/2017 in [place]  Referring Provider  Dr. Lake Bells      Encounter Date: 09/07/2017  Check In: Session Check In - 09/07/17 1113      Check-In   Location  [facility]    Staff Present  Rodney Langton, RN;Portia Rollene Rotunda, RN, BSN;Molly diVincenzo, MS, ACSM RCEP, Exercise Physiologist;Joan Leonia Reeves, RN, BSN    Supervising physician immediately available to respond to emergencies  [facility] immediately available    Physician(s)  Dr. Eliseo Squires    Medication changes reported      No    Fall or balance concerns reported     No    Tobacco Cessation  No Change    Warm-up and Cool-down  Performed as group-led instruction    Resistance Training Performed  Yes    VAD Patient?  No      Pain Assessment   Currently in Pain?  No/denies    Multiple Pain Sites  No       Capillary Blood Glucose: No results found for this or any previous visit (from the past 24 hour(s)).  Exercise Prescription Changes - 09/07/17 1300      Response to Exercise   Blood Pressure (Admit)  106/52    Blood Pressure (Exercise)  140/68    Blood Pressure (Exit)  102/58    Heart Rate (Admit)  56 bpm    Heart Rate (Exercise)  105 bpm    Heart Rate (Exit)  69 bpm    Oxygen Saturation (Admit)  100 %    Oxygen Saturation (Exercise)  91 %    Oxygen Saturation (Exit)  100 %    Rating of Perceived Exertion (Exercise)  14    Perceived Dyspnea (Exercise)  1    Duration  Continue with 45 min of aerobic exercise without signs/symptoms of physical distress.    Intensity  THRR unchanged      Progression   Progression  Continue to progress workloads to maintain intensity without signs/symptoms of physical distress.      Resistance Training   Training Prescription  Yes    Weight  orange bands    Reps  10-15    Time  10  Minutes      Oxygen   Oxygen  Continuous    Liters  2-4      NuStep   Level  6    Minutes  17    METs  1.6      Track   Laps  11    Minutes  17       Social History   Tobacco Use  Smoking Status Never Smoker  Smokeless Tobacco Never Used    Goals Met:  Exercise tolerated well No report of cardiac concerns or symptoms Strength training completed today  Goals Unmet:  Not Applicable  Comments: Service time is from 1030 to [place]    Dr. Rush Farmer is Medical Director for Pulmonary Rehab at [facility].

## 2017-09-08 ENCOUNTER — Other Ambulatory Visit (HOSPITAL_COMMUNITY): Payer: Self-pay | Admitting: Pharmacist

## 2017-09-08 MED ORDER — ADCIRCA 20 MG PO TABS: 40.0000 mg | ORAL_TABLET | Freq: Every day | ORAL | 11 refills | Status: DC

## 2017-09-11 ENCOUNTER — Other Ambulatory Visit (HOSPITAL_COMMUNITY): Payer: Self-pay | Admitting: Pharmacist

## 2017-09-11 ENCOUNTER — Telehealth (HOSPITAL_COMMUNITY): Payer: Self-pay | Admitting: Pharmacist

## 2017-09-11 NOTE — Telephone Encounter (Signed)
Adcirca brand name Rx called in to [facility] since they did not receive my erx last week. Left VM with info for Ms. Console.   Ruta Hinds. Velva Harman, PharmD, BCPS, CPP Clinical Pharmacist Pager: [phone] Phone: [phone] 09/11/2017 11:09 AM

## 2017-09-12 ENCOUNTER — Encounter (HOSPITAL_COMMUNITY)
Admission: RE | Admit: 2017-09-12 | Discharge: 2017-09-12 | Disposition: A | Discharge status: Home / Self Care | Payer: PPO | Source: Ambulatory Visit | Attending: Pulmonary Disease | Admitting: Pulmonary Disease

## 2017-09-12 VITALS — Wt 220.9 lb

## 2017-09-12 DIAGNOSIS — J9611 Chronic respiratory failure with hypoxia: Secondary | ICD-10-CM | POA: Diagnosis not present

## 2017-09-12 NOTE — Progress Notes (Signed)
Daily Session Note  Patient Details  Name: [name] MRN: [id number] Date of Birth: Sep 28, 1950 Referring Provider:     Pulmonary Rehab Walk Test from 04/04/2017 in [place]  Referring Provider  Dr. Lake Bells      Encounter Date: 09/12/2017  Check In: Session Check In - 09/12/17 1243      Check-In   Location  [facility]       Capillary Blood Glucose: No results found for this or any previous visit (from the past 24 hour(s)).  Exercise Prescription Changes - 09/12/17 1200      Response to Exercise   Blood Pressure (Admit)  100/42    Blood Pressure (Exercise)  110/56    Blood Pressure (Exit)  100/50    Heart Rate (Admit)  55 bpm    Heart Rate (Exercise)  104 bpm    Heart Rate (Exit)  71 bpm    Oxygen Saturation (Admit)  100 %    Oxygen Saturation (Exercise)  96 %    Oxygen Saturation (Exit)  96 %    Rating of Perceived Exertion (Exercise)  15    Perceived Dyspnea (Exercise)  1    Duration  Continue with 45 min of aerobic exercise without signs/symptoms of physical distress.    Intensity  THRR unchanged      Progression   Progression  Continue to progress workloads to maintain intensity without signs/symptoms of physical distress.      Resistance Training   Training Prescription  Yes    Weight  orange bands    Reps  10-15    Time  10 Minutes      Oxygen   Oxygen  Continuous    Liters  2-4      NuStep   Level  6    Minutes  34    METs  1.7      Track   Laps  8    Minutes  17       Social History   Tobacco Use  Smoking Status Never Smoker  Smokeless Tobacco Never Used    Goals Met:  Exercise tolerated well No report of cardiac concerns or symptoms Strength training completed today  Goals Unmet:  Not Applicable  Comments: Service time is from 10:30a to 12:00p    Dr. Rush Farmer is Medical Director for Pulmonary Rehab at [facility].

## 2017-09-14 ENCOUNTER — Encounter (HOSPITAL_COMMUNITY)
Admission: RE | Admit: 2017-09-14 | Discharge: 2017-09-14 | Disposition: A | Discharge status: Home / Self Care | Payer: PPO | Source: Ambulatory Visit | Attending: Pulmonary Disease | Admitting: Pulmonary Disease

## 2017-09-14 DIAGNOSIS — J9611 Chronic respiratory failure with hypoxia: Secondary | ICD-10-CM | POA: Diagnosis not present

## 2017-09-18 ENCOUNTER — Ambulatory Visit: Payer: PPO | Admitting: Pulmonary Disease

## 2017-09-18 ENCOUNTER — Encounter: Payer: Self-pay | Admitting: Pulmonary Disease

## 2017-09-18 VITALS — BP 110/60 | HR 60 | Ht 62.0 in | Wt 220.2 lb

## 2017-09-18 DIAGNOSIS — J9611 Chronic respiratory failure with hypoxia: Secondary | ICD-10-CM

## 2017-09-18 DIAGNOSIS — I270 Primary pulmonary hypertension: Secondary | ICD-10-CM

## 2017-09-18 DIAGNOSIS — G4733 Obstructive sleep apnea (adult) (pediatric): Secondary | ICD-10-CM | POA: Diagnosis not present

## 2017-09-18 DIAGNOSIS — I5032 Chronic diastolic (congestive) heart failure: Secondary | ICD-10-CM | POA: Diagnosis not present

## 2017-09-18 NOTE — Patient Instructions (Signed)
Pulmonary hypertension: This is due to obstructive sleep apnea and diastolic heart failure Continue follow-up with the advanced heart failure clinic to discuss what to do with the Adcirca  Diastolic heart failure: Continue to weigh yourself daily Keep sodium intake less than 2 g a day Continue diuretics as prescribed  Chronic respiratory failure with hypoxemia: Use 2 L of oxygen at rest, 3 L with exertion  Obstructive sleep apnea: Continue using CPAP nightly  We will see you back in 4 months, we will do a 6-minute walk on that visit
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
## 2017-09-20 ENCOUNTER — Telehealth (HOSPITAL_COMMUNITY): Payer: Self-pay | Admitting: Pharmacist

## 2017-09-20 NOTE — Telephone Encounter (Signed)
PA approved by [employer] through 09/04/18.    Ruta Hinds. Velva Harman, PharmD, BCPS, CPP Clinical Pharmacist Phone: [phone] 09/20/2017 11:19 AM

## 2017-09-23 DIAGNOSIS — R269 Unspecified abnormalities of gait and mobility: Secondary | ICD-10-CM | POA: Diagnosis not present

## 2017-09-23 DIAGNOSIS — G4733 Obstructive sleep apnea (adult) (pediatric): Secondary | ICD-10-CM | POA: Diagnosis not present

## 2017-09-23 DIAGNOSIS — J9610 Chronic respiratory failure, unspecified whether with hypoxia or hypercapnia: Secondary | ICD-10-CM | POA: Diagnosis not present

## 2017-09-23 DIAGNOSIS — I5040 Unspecified combined systolic (congestive) and diastolic (congestive) heart failure: Secondary | ICD-10-CM | POA: Diagnosis not present

## 2017-09-29 ENCOUNTER — Encounter (HOSPITAL_COMMUNITY): Payer: Self-pay | Admitting: Cardiology

## 2017-09-29 ENCOUNTER — Ambulatory Visit (HOSPITAL_COMMUNITY)
Admission: RE | Admit: 2017-09-29 | Discharge: 2017-09-29 | Disposition: A | Discharge status: Home / Self Care | Payer: PPO | Source: Ambulatory Visit | Attending: Cardiology | Admitting: Cardiology

## 2017-09-29 VITALS — BP 137/63 | HR 72 | Wt 223.4 lb

## 2017-09-29 DIAGNOSIS — Z79890 Hormone replacement therapy (postmenopausal): Secondary | ICD-10-CM | POA: Diagnosis not present

## 2017-09-29 DIAGNOSIS — G4733 Obstructive sleep apnea (adult) (pediatric): Secondary | ICD-10-CM | POA: Insufficient documentation

## 2017-09-29 DIAGNOSIS — J9611 Chronic respiratory failure with hypoxia: Secondary | ICD-10-CM | POA: Insufficient documentation

## 2017-09-29 DIAGNOSIS — J9612 Chronic respiratory failure with hypercapnia: Secondary | ICD-10-CM | POA: Insufficient documentation

## 2017-09-29 DIAGNOSIS — E538 Deficiency of other specified B group vitamins: Secondary | ICD-10-CM

## 2017-09-29 DIAGNOSIS — E039 Hypothyroidism, unspecified: Secondary | ICD-10-CM | POA: Diagnosis not present

## 2017-09-29 DIAGNOSIS — Z886 Allergy status to analgesic agent status: Secondary | ICD-10-CM | POA: Diagnosis not present

## 2017-09-29 DIAGNOSIS — I5032 Chronic diastolic (congestive) heart failure: Secondary | ICD-10-CM

## 2017-09-29 DIAGNOSIS — Z7901 Long term (current) use of anticoagulants: Secondary | ICD-10-CM | POA: Insufficient documentation

## 2017-09-29 DIAGNOSIS — Z79899 Other long term (current) drug therapy: Secondary | ICD-10-CM | POA: Diagnosis not present

## 2017-09-29 DIAGNOSIS — I272 Pulmonary hypertension, unspecified: Secondary | ICD-10-CM

## 2017-09-29 DIAGNOSIS — Z8673 Personal history of transient ischemic attack (TIA), and cerebral infarction without residual deficits: Secondary | ICD-10-CM | POA: Diagnosis not present

## 2017-09-29 DIAGNOSIS — Z888 Allergy status to other drugs, medicaments and biological substances status: Secondary | ICD-10-CM | POA: Diagnosis not present

## 2017-09-29 DIAGNOSIS — I2721 Secondary pulmonary arterial hypertension: Secondary | ICD-10-CM | POA: Diagnosis not present

## 2017-09-29 DIAGNOSIS — I482 Chronic atrial fibrillation, unspecified: Secondary | ICD-10-CM

## 2017-09-29 DIAGNOSIS — Z881 Allergy status to other antibiotic agents status: Secondary | ICD-10-CM | POA: Diagnosis not present

## 2017-09-29 LAB — CBC
HEMATOCRIT: 36.4 % (ref 36.0–46.0)
Hemoglobin: 11.7 g/dL — ABNORMAL LOW (ref 12.0–15.0)
MCH: 32 pg (ref 26.0–34.0)
MCHC: 32.1 g/dL (ref 30.0–36.0)
MCV: 99.5 fL (ref 78.0–100.0)
Platelets: 125 10*3/uL — ABNORMAL LOW (ref 150–400)
RBC: 3.66 MIL/uL — ABNORMAL LOW (ref 3.87–5.11)
RDW: 15.2 % (ref 11.5–15.5)
WBC: 5.4 10*3/uL (ref 4.0–10.5)

## 2017-09-29 LAB — BASIC METABOLIC PANEL
Anion gap: 12 (ref 5–15)
BUN: 33 mg/dL — AB (ref 6–20)
CALCIUM: 9.3 mg/dL (ref 8.9–10.3)
CHLORIDE: 106 mmol/L (ref 101–111)
CO2: 21 mmol/L — AB (ref 22–32)
CREATININE: 1.37 mg/dL — AB (ref 0.44–1.00)
GFR calc non Af Amer: 39 mL/min — ABNORMAL LOW (ref >60)
GFR, EST AFRICAN AMERICAN: 45 mL/min — AB (ref >60)
Glucose, Bld: 91 mg/dL (ref 65–99)
Potassium: 3.7 mmol/L (ref 3.5–5.1)
Sodium: 139 mmol/L (ref 135–145)

## 2017-09-29 LAB — HEMOGLOBIN A1C
Hgb A1c MFr Bld: 5.4 % (ref 4.8–5.6)
MEAN PLASMA GLUCOSE: 108.28 mg/dL

## 2017-09-29 LAB — IRON AND TIBC
IRON: 79 ug/dL (ref 28–170)
Saturation Ratios: 20 % (ref 10.4–31.8)
TIBC: 391 ug/dL (ref 250–450)
UIBC: 312 ug/dL

## 2017-09-29 LAB — FOLATE: FOLATE: 7.1 ng/mL (ref >5.9)

## 2017-09-29 LAB — VITAMIN B12: Vitamin B-12: 569 pg/mL (ref 180–914)

## 2017-09-29 LAB — FERRITIN: FERRITIN: 35 ng/mL (ref 11–307)

## 2017-09-29 NOTE — Patient Instructions (Addendum)
Make sure you take Torsemide 40 mg (2 tabs) in AM, and 20 mg (1 tab) in PM   Labs drawn today (if we do not call you, then your lab work was stable)   Your physician recommends that you schedule a follow-up appointment in: 3 months with [name]

## 2017-09-30 DIAGNOSIS — J9610 Chronic respiratory failure, unspecified whether with hypoxia or hypercapnia: Secondary | ICD-10-CM | POA: Diagnosis not present

## 2017-09-30 DIAGNOSIS — I5040 Unspecified combined systolic (congestive) and diastolic (congestive) heart failure: Secondary | ICD-10-CM | POA: Diagnosis not present

## 2017-09-30 DIAGNOSIS — R269 Unspecified abnormalities of gait and mobility: Secondary | ICD-10-CM | POA: Diagnosis not present

## 2017-09-30 DIAGNOSIS — G4733 Obstructive sleep apnea (adult) (pediatric): Secondary | ICD-10-CM | POA: Diagnosis not present

## 2017-09-30 NOTE — Progress Notes (Signed)
Advanced Heart Failure Clinic Note   Primary Care: Dr. [name]  HF Cardiology: [name]  Pulm: Dr. Lake Bells  HPI:  [name] is a 67 y.o. 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  Admitted 3/2 ->02/22/34 with A/C diastolic CHF and A/C respiratory failure. Pt initially refused Bipap so venti mask used. Eventually tolerated transition to BiPAP. Overall pt diuresed from 285 lbs down to 229 lbs with 38 L of diuresis.  Echo 11/07/16 EF 55-60%, PASP 68m Hg, mildly dilated RV with moderate to severely decreased RV systolic function. RHC/LHC in 3/18 showed no coronary disease and confirmed severe PH.    She returns today for followup of diastolic CHF. Weight is up 2 lbs. Feels like she is overall improved. Doing pulmonary rehab. No BRBPR/melena. She has low back pain, knee pain, shoulder pain.  More limited by arthritis than dyspnea. Using home oxygen.  Can walk for 15 minutes on oxygen without dyspnea. No lightheadedness or chest pain.  Only taking pm torsemide about 3 days/week.   Recent 6 minute walk at pulmonary rehab with > 900 feet.    Labs (1/19): K 3.7, creatinine 1.37  PMH 1. Chronic diastolic CHF with severe pulmonary hypertension and prominent RV failure:  Echo (3/18) with EF 55-60%, PASP 985mHg, mildly dilated RV with moderate to severely decreased RV systolic function. - RHC/LHC 11/07/16: No angiographic CAD; PA 90/55, LVEDP 23 (PCWP inaccurate), CI 2.15 Fick/2.32 Thermo, shunt run negative, PVR 5.7 WU.  2. Chronic hypercarbic/hypoxic respiratory failure with OHS/OSA. Sees Dr. McLake BellsUsing nightly BiPAP and oxygen by nasal cannula during the day.  3. Atrial fibrillation: Chronic - Rate controlled on Xarelto + atenolol  4. 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 PVR 5.7 WU on RHC  3/18. Suspect group 2 (elevated LA pressure) and group 3 (OHS/OSA) PH. However, cannot rule out group 1 component. She had V/Q scan that was not suggestive of chronic PE and high resolution chest CT that was not suggestive of ILD. ANA, RF, anti-SCL70 all negative.   5. H/o CVA 6. Hypothyroidism 7. H/o TKR   Current Outpatient Medications  Medication Sig Dispense Refill  . acetaZOLAMIDE (DIAMOX) 250 MG tablet Take 1 tablet (250 mg total) by mouth daily. 90 tablet 2  . ADCIRCA 20 MG tablet Take 2 tablets (40 mg total) by mouth daily. 60 tablet 11  . allopurinol (ZYLOPRIM) 100 MG tablet Take 1 tablet (100 mg total) by mouth 2 (two) times daily. 90 tablet 3  . atenolol (TENORMIN) 50 MG tablet Take 50 mg by mouth daily. Take an extra 1/2 tab by mouth at bedtime    . colchicine 0.6 MG tablet Take 1 tablet (0.6 mg total) by mouth daily as needed (flareups). 60 tablet 1  . diclofenac sodium (VOLTAREN) 1 % GEL Apply 2 g topically 2 (two) times daily as needed (for arthritis).     . Marland Kitchenevothyroxine (SYNTHROID, LEVOTHROID) 75 MCG tablet Take 1 tablet (75 mcg total) by mouth daily. 90 tablet 0  . methocarbamol (ROBAXIN) 500 MG tablet TAKE 1/2 TABLET BY MOUTH AS NEEDED FOR MUSCLE SPASMS. 30 tablet 0  . OXYGEN Inhale 2 L into the lungs continuous.     . potassium chloride (K-DUR) 10 MEQ tablet Take 2 tablets (20 mEq total) by mouth 2 (two) times daily. [place]  tablet 3  . rivaroxaban (XARELTO) 20 MG TABS tablet Take 1 tablet (20 mg total) by mouth daily with supper. 90 tablet 3  . torsemide (DEMADEX) 20 MG tablet Take 32m (2 Tablets) in the AM and 241m(1 Tablet) in the PM. 90 tablet 6  . traMADol (ULTRAM) 50 MG tablet TAKE 1 TABLET BY MOUTH THREE TIMES A DAY AS NEEDED 90 tablet 0   No current facility-administered medications for this encounter.     Allergies  Allergen Reactions  . Ambien [Zolpidem Tartrate] Other (See Comments)    Amnesia and fall   . Losartan Other (See Comments)    Elevated  creatinine and swelling  . Prevacid [Lansoprazole] Swelling  . Adhesive [Tape] Other (See Comments)    Adhesive tape and band aids " irritate, " causes blisters and pulls skin when removing. Okay to use paper tape  . Keflex [Cephalexin] Swelling    Swelling in ankles, feet  . Motrin [Ibuprofen] Other (See Comments)    ANKLE EDEMA   . Prilosec [Omeprazole] Other (See Comments)    Swelling in ankles.  . Ace Inhibitors Cough  . Benadryl [Diphenhydramine Hcl] Other (See Comments)    topical  . Spironolactone Rash      Social History   Socioeconomic History  . Marital status: Divorced    Spouse name: Not on file  . Number of children: Not on file  . Years of education: Not on file  . Highest education level: Not on file  Social Needs  . Financial resource strain: Not on file  . Food insecurity - worry: Not on file  . Food insecurity - inability: Not on file  . Transportation needs - medical: Not on file  . Transportation needs - non-medical: Not on file  Occupational History  . Not on file  Tobacco Use  . Smoking status: Never Smoker  . Smokeless tobacco: Never Used  Substance and Sexual Activity  . Alcohol use: Yes    Alcohol/week: 0.6 oz    Types: 1 Glasses of wine per week    Comment: occ  . Drug use: No  . Sexual activity: No    Partners: Male    Birth control/protection: Post-menopausal  Other Topics Concern  . 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 COPD Mother   . Hypertension Mother   . Osteoporosis Mother   . Diabetes Father   . Hypertension Father   . Liver cancer Father   . Heart attack Father   . Hypertension Sister   . Hypertension Brother   . Stroke Maternal Grandmother     Vitals:   09/29/17 1028  BP: 137/63  Pulse: 72  SpO2: 98%  Weight: 223 lb 6.4 oz (101.3 kg)   Wt  Readings from Last 3 Encounters:  09/29/17 223 lb 6.4 oz (101.3 kg)  09/18/17 220 lb 3.2 oz (99.9 kg)  09/12/17 220 lb 14.4 oz (100.2 kg)   PHYSICAL EXAM: General: NAD Neck: JVP 8 cm with HJR, no thyromegaly or thyroid nodule.  Lungs: Clear to auscultation bilaterally with normal respiratory effort. CV: Nondisplaced PMI.  Heart regular S1/S2, no S3/S4, no murmur.  Trace ankle Skin: Intact without lesions or rashes.  Neurologic: Alert and oriented x  3.  Psych: Normal affect. Extremities: No clubbing or cyanosis.  HEENT: Normal.   ASSESSMENT & PLAN: 1. Chronic diastolic CHF:  Associated with severe pulmonary hypertension and prominent RV failure.  NYHA class II symptoms, stable.  More limited by orthopedic issues than CHF.  Mild volume overload on exam.  - Take torsemide 1m in the am and 20 mg in the pm. I asked to take the pm dose regularly, she has not been doing this. - Continue diamox 250 mg daily.  - BMET today.   2. Chronic hypercarbic/hypoxic respiratory failure with OHS/OSA: Sees Dr. MLake Bells   - Continue continuous O2 at 2L.  - compliant with Bipap nightly.  - Continue pulmonary rehab, this seems to have helped.  3. Atrial fibrillation: Chronic, rate controlled  - Continue atenolol 50 qam/25 qpm.   - Continue Xarelto for anticoagulation.  Check CBC today.  4. 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 PVR 5.7 WU by [place] 3/18. Suspect group 2 (elevated LA pressure) and group 3 (OHS/OSA) PH. However, cannot rule out group 1 component. She had V/Q scan that was not suggestive of chronic PE and high resolution chest CT that was not suggestive of ILD. ANA, RF, anti-SCL70 all negative.  She has not had a marked improvement from taking Adcirca => suspect predominantly group 2 and 3 PH.  Will hold off on additional pulmonary vasodilators.  - Continue Adcirca 40 mg daily.  - Continue 02 at 2 L and BiPAP at night for OHS/OSA.  - Good 6  minute walk recently at pulmonary rehab.   DLoralie Champagne1/26/2019

## 2017-10-02 ENCOUNTER — Encounter (HOSPITAL_COMMUNITY): Payer: Self-pay

## 2017-10-02 DIAGNOSIS — J9611 Chronic respiratory failure with hypoxia: Secondary | ICD-10-CM

## 2017-10-02 NOTE — Progress Notes (Signed)
Discharge Progress Report  Patient Details  Name: [name] MRN: [id number] Date of Birth: 1950/12/14 Referring Provider:     Pulmonary Rehab Walk Test from 04/04/2017 in [place]  Referring Provider  Dr. Lake Bells       Number of Visits: 36  Reason for Discharge:  Patient reached a stable level of exercise. Patient independent in their exercise. Patient has met program and personal goals.  Smoking History:  Social History   Tobacco Use  Smoking Status Never Smoker  Smokeless Tobacco Never Used    Diagnosis:  Chronic respiratory failure with hypoxia ([place])  ADL UCSD: Pulmonary Assessment Scores    Row Name 09/14/17 1338 09/14/17 1615       ADL UCSD   ADL Phase  Exit  Exit    SOB Score total  -  24      CAT Score   CAT Score  -  4 Exit      mMRC Score   mMRC Score  1  -       Initial Exercise Prescription:   Discharge Exercise Prescription (Final Exercise Prescription Changes): Exercise Prescription Changes - 09/12/17 1200      Response to Exercise   Blood Pressure (Admit)  100/42    Blood Pressure (Exercise)  110/56    Blood Pressure (Exit)  100/50    Heart Rate (Admit)  55 bpm    Heart Rate (Exercise)  104 bpm    Heart Rate (Exit)  71 bpm    Oxygen Saturation (Admit)  100 %    Oxygen Saturation (Exercise)  96 %    Oxygen Saturation (Exit)  96 %    Rating of Perceived Exertion (Exercise)  15    Perceived Dyspnea (Exercise)  1    Duration  Continue with 45 min of aerobic exercise without signs/symptoms of physical distress.    Intensity  THRR unchanged      Progression   Progression  Continue to progress workloads to maintain intensity without signs/symptoms of physical distress.      Resistance Training   Training Prescription  Yes    Weight  orange bands    Reps  10-15    Time  10 Minutes      Oxygen   Oxygen  Continuous    Liters  2-4      NuStep   Level  6    Minutes  34    METs  1.7      Track    Laps  8    Minutes  17       Functional Capacity: 6 Minute Walk    Row Name 09/14/17 1316         6 Minute Walk   Phase  Discharge     Distance  900 feet     Distance Feet Change  490 ft     Walk Time  6 minutes     # of Rest Breaks  0     MPH  1.7     METS  2.3     RPE  12     Perceived Dyspnea   1     Symptoms  No     Comments  used wheelchair     Resting HR  74 bpm     Resting BP  120/70     Resting Oxygen Saturation   100 %     Exercise Oxygen Saturation  during 6 min walk  94 %     Max Ex. HR  112 bpm     Max Ex. BP  144/80       Interval HR   1 Minute HR  94     4 Minute HR  100     5 Minute HR  112     6 Minute HR  110     2 Minute Post HR  96     Interval Heart Rate?  Yes       Interval Oxygen   Baseline Oxygen Saturation %  100 %     1 Minute Oxygen Saturation %  88 %     1 Minute Liters of Oxygen  3 L     2 Minute Liters of Oxygen  3 L     3 Minute Liters of Oxygen  3 L     4 Minute Oxygen Saturation %  95 %     4 Minute Liters of Oxygen  3 L     5 Minute Oxygen Saturation %  96 %     5 Minute Liters of Oxygen  3 L     6 Minute Oxygen Saturation %  94 %     6 Minute Liters of Oxygen  3 L     2 Minute Post Oxygen Saturation %  92 %     2 Minute Post Liters of Oxygen  3 L        Psychological, QOL, Others - Outcomes: PHQ 2/9: Depression screen [facility] 2/9 03/27/2017 12/27/2016 04/22/2015  Decreased Interest 0 1 0  Down, Depressed, Hopeless 0 1 1  PHQ - 2 Score 0 2 1  Some recent data might be hidden    Quality of Life:   Personal Goals: Goals established at orientation with interventions provided to work toward goal.    Personal Goals Discharge: Goals and Risk Factor Review    Row Name 08/08/17 1428 08/31/17 0843 10/02/17 1219         Core Components/Risk Factors/Patient Goals Review   Personal Goals Review  Develop more efficient breathing techniques such as purse lipped breathing and diaphragmatic breathing and practicing self-pacing  with activity.;Improve shortness of breath with ADL's;Increase knowledge of respiratory medications and ability to use respiratory devices properly.;Heart Failure  Develop more efficient breathing techniques such as purse lipped breathing and diaphragmatic breathing and practicing self-pacing with activity.;Improve shortness of breath with ADL's;Increase knowledge of respiratory medications and ability to use respiratory devices properly.;Heart Failure  Develop more efficient breathing techniques such as purse lipped breathing and diaphragmatic breathing and practicing self-pacing with activity.;Improve shortness of breath with ADL's;Increase knowledge of respiratory medications and ability to use respiratory devices properly.;Heart Failure     Review  patient continues to do well. her back is still limiting her ability to walk however she just recently walked 9 laps in 15 min which is a personal new record. She is doing well managing her heart failure weight. She has been extended to 36 sessions  patient continues to do well. her back is still limiting her ability to walk however she just recently walked 9 laps in 15 min which is a personal new record. She is doing well managing her heart failure weight. She has been extended to 36 sessions  patient met all pulmonary rehab goals at discharge     Expected Outcomes  see admission expected outcomes  see admission expected outcomes  -        Exercise Goals and  Review:   Nutrition & Weight - Outcomes:    Nutrition:   Nutrition Discharge: Nutrition Assessments - 09/18/17 0826      Rate Your Plate Scores   Pre Score  57    Post Score  60       Education Questionnaire Score: Knowledge Questionnaire Score - 09/14/17 1615      Knowledge Questionnaire Score   Post Score  13/13

## 2017-10-04 DIAGNOSIS — I5040 Unspecified combined systolic (congestive) and diastolic (congestive) heart failure: Secondary | ICD-10-CM | POA: Diagnosis not present

## 2017-10-04 DIAGNOSIS — J9610 Chronic respiratory failure, unspecified whether with hypoxia or hypercapnia: Secondary | ICD-10-CM | POA: Diagnosis not present

## 2017-10-04 DIAGNOSIS — R269 Unspecified abnormalities of gait and mobility: Secondary | ICD-10-CM | POA: Diagnosis not present

## 2017-10-27 ENCOUNTER — Other Ambulatory Visit (HOSPITAL_COMMUNITY): Payer: Self-pay | Admitting: Internal Medicine

## 2017-10-27 NOTE — Telephone Encounter (Signed)
Last filled 08/30/18, #90 Last seen 08/08/17  Please advise Dr Regis Bill, thanks.

## 2017-10-27 NOTE — Telephone Encounter (Signed)
Sent in electronically .  

## 2017-10-31 DIAGNOSIS — G4733 Obstructive sleep apnea (adult) (pediatric): Secondary | ICD-10-CM | POA: Diagnosis not present

## 2017-10-31 DIAGNOSIS — J9610 Chronic respiratory failure, unspecified whether with hypoxia or hypercapnia: Secondary | ICD-10-CM | POA: Diagnosis not present

## 2017-10-31 DIAGNOSIS — I5040 Unspecified combined systolic (congestive) and diastolic (congestive) heart failure: Secondary | ICD-10-CM | POA: Diagnosis not present

## 2017-10-31 DIAGNOSIS — R269 Unspecified abnormalities of gait and mobility: Secondary | ICD-10-CM | POA: Diagnosis not present

## 2017-11-01 DIAGNOSIS — R269 Unspecified abnormalities of gait and mobility: Secondary | ICD-10-CM | POA: Diagnosis not present

## 2017-11-01 DIAGNOSIS — G4733 Obstructive sleep apnea (adult) (pediatric): Secondary | ICD-10-CM | POA: Diagnosis not present

## 2017-11-01 DIAGNOSIS — J9610 Chronic respiratory failure, unspecified whether with hypoxia or hypercapnia: Secondary | ICD-10-CM | POA: Diagnosis not present

## 2017-11-01 DIAGNOSIS — I5040 Unspecified combined systolic (congestive) and diastolic (congestive) heart failure: Secondary | ICD-10-CM | POA: Diagnosis not present

## 2017-11-02 DIAGNOSIS — I5040 Unspecified combined systolic (congestive) and diastolic (congestive) heart failure: Secondary | ICD-10-CM | POA: Diagnosis not present

## 2017-11-02 DIAGNOSIS — R269 Unspecified abnormalities of gait and mobility: Secondary | ICD-10-CM | POA: Diagnosis not present

## 2017-11-02 DIAGNOSIS — J9610 Chronic respiratory failure, unspecified whether with hypoxia or hypercapnia: Secondary | ICD-10-CM | POA: Diagnosis not present

## 2017-11-21 ENCOUNTER — Other Ambulatory Visit (HOSPITAL_COMMUNITY): Payer: Self-pay | Admitting: Cardiology

## 2017-11-27 ENCOUNTER — Other Ambulatory Visit: Payer: Self-pay | Admitting: Internal Medicine

## 2017-11-28 DIAGNOSIS — I5040 Unspecified combined systolic (congestive) and diastolic (congestive) heart failure: Secondary | ICD-10-CM | POA: Diagnosis not present

## 2017-11-28 DIAGNOSIS — J9610 Chronic respiratory failure, unspecified whether with hypoxia or hypercapnia: Secondary | ICD-10-CM | POA: Diagnosis not present

## 2017-11-28 DIAGNOSIS — G4733 Obstructive sleep apnea (adult) (pediatric): Secondary | ICD-10-CM | POA: Diagnosis not present

## 2017-11-28 DIAGNOSIS — R269 Unspecified abnormalities of gait and mobility: Secondary | ICD-10-CM | POA: Diagnosis not present

## 2017-12-02 DIAGNOSIS — J9610 Chronic respiratory failure, unspecified whether with hypoxia or hypercapnia: Secondary | ICD-10-CM | POA: Diagnosis not present

## 2017-12-02 DIAGNOSIS — R269 Unspecified abnormalities of gait and mobility: Secondary | ICD-10-CM | POA: Diagnosis not present

## 2017-12-02 DIAGNOSIS — I5040 Unspecified combined systolic (congestive) and diastolic (congestive) heart failure: Secondary | ICD-10-CM | POA: Diagnosis not present

## 2017-12-11 ENCOUNTER — Telehealth: Payer: Self-pay | Admitting: Pulmonary Disease

## 2017-12-11 NOTE — Telephone Encounter (Signed)
Randal calling stating this has to be taken care of today, CB is [phone].

## 2017-12-11 NOTE — Telephone Encounter (Signed)
Called and spoke to [name] with [facility]. He couldn't get in touch with anyone at [facility] but he wanted to clarify if patient would be needing CPAP for longer than 6 months. He stated there is nothing needed currently.

## 2017-12-29 DIAGNOSIS — R269 Unspecified abnormalities of gait and mobility: Secondary | ICD-10-CM | POA: Diagnosis not present

## 2017-12-29 DIAGNOSIS — I5040 Unspecified combined systolic (congestive) and diastolic (congestive) heart failure: Secondary | ICD-10-CM | POA: Diagnosis not present

## 2017-12-29 DIAGNOSIS — J9610 Chronic respiratory failure, unspecified whether with hypoxia or hypercapnia: Secondary | ICD-10-CM | POA: Diagnosis not present

## 2017-12-29 DIAGNOSIS — G4733 Obstructive sleep apnea (adult) (pediatric): Secondary | ICD-10-CM | POA: Diagnosis not present

## 2018-01-01 ENCOUNTER — Encounter (HOSPITAL_COMMUNITY): Payer: Self-pay | Admitting: Cardiology

## 2018-01-01 ENCOUNTER — Ambulatory Visit (HOSPITAL_COMMUNITY)
Admission: RE | Admit: 2018-01-01 | Discharge: 2018-01-01 | Disposition: A | Discharge status: Home / Self Care | Payer: PPO | Source: Ambulatory Visit | Attending: Cardiology | Admitting: Cardiology

## 2018-01-01 ENCOUNTER — Other Ambulatory Visit: Payer: Self-pay

## 2018-01-01 VITALS — BP 134/63 | HR 70 | Wt 224.2 lb

## 2018-01-01 DIAGNOSIS — J9611 Chronic respiratory failure with hypoxia: Secondary | ICD-10-CM | POA: Diagnosis not present

## 2018-01-01 DIAGNOSIS — Z7901 Long term (current) use of anticoagulants: Secondary | ICD-10-CM | POA: Diagnosis not present

## 2018-01-01 DIAGNOSIS — Z9981 Dependence on supplemental oxygen: Secondary | ICD-10-CM | POA: Insufficient documentation

## 2018-01-01 DIAGNOSIS — E039 Hypothyroidism, unspecified: Secondary | ICD-10-CM | POA: Diagnosis not present

## 2018-01-01 DIAGNOSIS — Z79890 Hormone replacement therapy (postmenopausal): Secondary | ICD-10-CM | POA: Diagnosis not present

## 2018-01-01 DIAGNOSIS — I482 Chronic atrial fibrillation, unspecified: Secondary | ICD-10-CM

## 2018-01-01 DIAGNOSIS — G4733 Obstructive sleep apnea (adult) (pediatric): Secondary | ICD-10-CM | POA: Diagnosis not present

## 2018-01-01 DIAGNOSIS — I5032 Chronic diastolic (congestive) heart failure: Secondary | ICD-10-CM

## 2018-01-01 DIAGNOSIS — Z79899 Other long term (current) drug therapy: Secondary | ICD-10-CM | POA: Insufficient documentation

## 2018-01-01 DIAGNOSIS — Z8673 Personal history of transient ischemic attack (TIA), and cerebral infarction without residual deficits: Secondary | ICD-10-CM | POA: Insufficient documentation

## 2018-01-01 DIAGNOSIS — I2721 Secondary pulmonary arterial hypertension: Secondary | ICD-10-CM | POA: Insufficient documentation

## 2018-01-01 DIAGNOSIS — I272 Pulmonary hypertension, unspecified: Secondary | ICD-10-CM | POA: Diagnosis not present

## 2018-01-01 LAB — BASIC METABOLIC PANEL
Anion gap: 12 (ref 5–15)
BUN: 32 mg/dL — AB (ref 6–20)
CO2: 25 mmol/L (ref 22–32)
CREATININE: 1.36 mg/dL — AB (ref 0.44–1.00)
Calcium: 9.6 mg/dL (ref 8.9–10.3)
Chloride: 104 mmol/L (ref 101–111)
GFR calc Af Amer: 45 mL/min — ABNORMAL LOW (ref >60)
GFR calc non Af Amer: 39 mL/min — ABNORMAL LOW (ref >60)
GLUCOSE: 102 mg/dL — AB (ref 65–99)
POTASSIUM: 3.7 mmol/L (ref 3.5–5.1)
Sodium: 141 mmol/L (ref 135–145)

## 2018-01-01 LAB — CBC
HEMATOCRIT: 38 % (ref 36.0–46.0)
Hemoglobin: 12.3 g/dL (ref 12.0–15.0)
MCH: 32.5 pg (ref 26.0–34.0)
MCHC: 32.4 g/dL (ref 30.0–36.0)
MCV: 100.3 fL — AB (ref 78.0–100.0)
PLATELETS: 128 10*3/uL — AB (ref 150–400)
RBC: 3.79 MIL/uL — ABNORMAL LOW (ref 3.87–5.11)
RDW: 15.7 % — AB (ref 11.5–15.5)
WBC: 5.1 10*3/uL (ref 4.0–10.5)

## 2018-01-01 NOTE — Progress Notes (Signed)
Advanced Heart Failure Clinic Note   Primary Care: Dr. [name]  HF Cardiology: [name]  Pulm: Dr. Lake Bells  HPI:  [name] is a 67 y.o. female with PMH of pulmonary HTN, morbid obesity, chronic afib (on Xarelto, CVA in 2012, OHS/OSA (Had U PE3 surgery, and chronic respiratory failure with hypoxemia on continuous 02 at 2 lpm via [place]).  Admitted 3/2 ->2/95/18 with A/C diastolic CHF and A/C respiratory failure. Pt initially refused Bipap so venti mask used. Eventually tolerated transition to BiPAP. Overall pt diuresed from 285 lbs down to 229 lbs with 38 L of diuresis.  Echo 11/07/16 EF 55-60%, PASP 69m Hg, mildly dilated RV with moderate to severely decreased RV systolic function. RHC/LHC in 3/18 showed no coronary disease and confirmed severe PH.    She returns today for followup of diastolic CHF. Weight is stable.  She has completed pulmonary rehab. She feels like this helped a lot and overall, exercise tolerance is better.  She is now going to a local senior center to walk on a track and use the Nu-step machine.  Oxygen saturation is now around 95% at rest.  She can walk 15-20 minutes without stopping.  Still dyspnea with stairs/inclines.  No orthopnea/PND.  No chest pain. No BRBPR/melena.   ECG (personally reviewed): Atrial fibrillation at 57, poor RWP   Labs (1/19): K 3.7, creatinine 1.37  PMH 1. Chronic diastolic CHF with severe pulmonary hypertension and prominent RV failure:  Echo (3/18) with EF 55-60%, PASP 927mHg, mildly dilated RV with moderate to severely decreased RV systolic function. - RHC/LHC 11/07/16: No angiographic CAD; PA 90/55, LVEDP 23 (PCWP inaccurate), CI 2.15 Fick/2.32 Thermo, shunt run negative, PVR 5.7 WU.  2. Chronic hypercarbic/hypoxic respiratory failure with OHS/OSA. Sees Dr. McLake BellsUsing nightly BiPAP and oxygen by nasal cannula during the day.  3. Atrial fibrillation: Chronic - Rate controlled on Xarelto + atenolol  4. Pulmonary hypertension: Mixed  pulmonary venous and pulmonary arterial hypertension. PVR 5.7 WU on RHC 3/18. Suspect group 2 (elevated LA pressure) and group 3 (OHS/OSA) PH. However, cannot rule out group 1 component. She had V/Q scan that was not suggestive of chronic PE and high resolution chest CT that was not suggestive of ILD. ANA, RF, anti-SCL70 all negative.   5. H/o CVA 6. Hypothyroidism 7. H/o TKR   Current Outpatient Medications  Medication Sig Dispense Refill  . acetaZOLAMIDE (DIAMOX) 250 MG tablet Take 1 tablet (250 mg total) by mouth daily. 90 tablet 2  . ADCIRCA 20 MG tablet Take 2 tablets (40 mg total) by mouth daily. 60 tablet 11  . allopurinol (ZYLOPRIM) 100 MG tablet TAKE 1 TABLET BY MOUTH TWICE A DAY 90 tablet 0  . atenolol (TENORMIN) 50 MG tablet Take 50 mg by mouth daily. Take an extra 1/2 tab by mouth at bedtime    . colchicine 0.6 MG tablet Take 1 tablet (0.6 mg total) by mouth daily as needed (flareups). 60 tablet 1  . diclofenac sodium (VOLTAREN) 1 % GEL Apply 2 g topically 2 (two) times daily as needed (for arthritis).     . Marland Kitchenevothyroxine (SYNTHROID, LEVOTHROID) 75 MCG tablet Take 1 tablet (75 mcg total) by mouth daily. 90 tablet 0  . methocarbamol (ROBAXIN) 500 MG tablet TAKE 1/2 TABLET BY MOUTH AS NEEDED FOR MUSCLE SPASMS. 30 tablet 0  . OXYGEN Inhale 2 L into the lungs continuous.     . potassium chloride (K-DUR) 10 MEQ tablet Take 2 tablets (20 mEq  total) by mouth 2 (two) times daily. 180 tablet 3  . potassium chloride (KLOR-CON 10) 10 MEQ tablet Take 2 tablets (20 mEq total) by mouth 2 (two) times daily. 120 tablet 2  . rivaroxaban (XARELTO) 20 MG TABS tablet Take 1 tablet (20 mg total) by mouth daily with supper. 90 tablet 3  . torsemide (DEMADEX) 20 MG tablet Take 19m (2 Tablets) in the AM and 258m(1 Tablet) in the PM. 90 tablet 6  . traMADol (ULTRAM) 50 MG tablet TAKE 1 TABLET BY MOUTH THREE TIMES A DAY AS NEEDED 90 tablet 0   No current facility-administered medications for this  encounter.     Allergies  Allergen Reactions  . Ambien [Zolpidem Tartrate] Other (See Comments)    Amnesia and fall   . Losartan Other (See Comments)    Elevated creatinine and swelling  . Prevacid [Lansoprazole] Swelling  . Adhesive [Tape] Other (See Comments)    Adhesive tape and band aids " irritate, " causes blisters and pulls skin when removing. Okay to use paper tape  . Keflex [Cephalexin] Swelling    Swelling in ankles, feet  . Motrin [Ibuprofen] Other (See Comments)    ANKLE EDEMA   . Prilosec [Omeprazole] Other (See Comments)    Swelling in ankles.  . Ace Inhibitors Cough  . Benadryl [Diphenhydramine Hcl] Other (See Comments)    topical  . Spironolactone Rash      Social History   Socioeconomic History  . Marital status: Divorced    Spouse name: Not on file  . Number of children: Not on file  . Years of education: Not on file  . Highest education level: Not on file  Occupational History  . Not on file  Social Needs  . Financial resource strain: Not on file  . Food insecurity:    Worry: Not on file    Inability: Not on file  . Transportation needs:    Medical: Not on file    Non-medical: Not on file  Tobacco Use  . Smoking status: Never Smoker  . Smokeless tobacco: Never Used  Substance and Sexual Activity  . Alcohol use: Yes    Alcohol/week: 0.6 oz    Types: 1 Glasses of wine per week    Comment: occ  . Drug use: No  . Sexual activity: Never    Partners: Male    Birth control/protection: Post-menopausal  Lifestyle  . Physical activity:    Days per week: Not on file    Minutes per session: Not on file  . Stress: Not on file  Relationships  . Social connections:    Talks on phone: Not on file    Gets together: Not on file    Attends religious service: Not on file    Active member of club or organization: Not on file    Attends meetings of clubs or organizations: Not on file    Relationship status: Not on file  . Intimate partner violence:      Fear of current or ex partner: Not on file    Emotionally abused: Not on file    Physically abused: Not on file    Forced sexual activity: Not on file  Other Topics Concern  . 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 COPD Mother   . Hypertension Mother   . Osteoporosis Mother   . Diabetes Father   . Hypertension Father   . Liver cancer Father   . Heart attack Father   . Hypertension Sister   . Hypertension Brother   . Stroke Maternal Grandmother     Vitals:   01/01/18 1032  BP: 134/63  Pulse: 70  SpO2: 100%  Weight: 224 lb 4 oz (101.7 kg)   Wt Readings from Last 3 Encounters:  01/01/18 224 lb 4 oz (101.7 kg)  09/29/17 223 lb 6.4 oz (101.3 kg)  09/18/17 220 lb 3.2 oz (99.9 kg)   PHYSICAL EXAM: General: NAD Neck: No JVD, no thyromegaly or thyroid nodule.  Lungs: Distant breath sounds.  CV: Nondisplaced PMI.  Heart regular S1/S2, no S3/S4, no murmur.  No peripheral edema.  No carotid bruit.  Normal pedal pulses.  Abdomen: Soft, nontender, no hepatosplenomegaly, no distention.  Skin: Intact without lesions or rashes.  Neurologic: Alert and oriented x 3.  Psych: Normal affect. Extremities: No clubbing or cyanosis.  HEENT: Normal.    ASSESSMENT & PLAN: 1. Chronic diastolic CHF:  Associated with severe pulmonary hypertension and prominent RV failure.  NYHA class II symptoms, overall improved after cardiac rehab.  More limited by orthopedic issues than CHF.  She does not appear volume overloaded on exam.   - Continue torsemide 76m in the am and 20 mg in the pm.  - Continue diamox 250 mg daily.  - BMET today.   - Repeat echo at followup appt to follow RV.  2. Chronic hypercarbic/hypoxic respiratory failure with OHS/OSA: Sees Dr. MLake Bells   - Continue home oxygen.  - Compliant with Bipap nightly.  -  Continue regular exercise.   3. Atrial fibrillation: Chronic, rate controlled  - Continue atenolol 50 qam/25 qpm.   - Continue Xarelto for anticoagulation.  Check CBC today.  4. 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 PVR 5.7 WU by [place] 3/18. Suspect group 2 (elevated LA pressure) and group 3 (OHS/OSA) PH. However, cannot rule out group 1 component. She had V/Q scan that was not suggestive of chronic PE and high resolution chest CT that was not suggestive of ILD. ANA, RF, anti-SCL70 all negative.  She did not have a marked improvement from taking Adcirca => suspect predominantly group 2 and 3 PH.  Will hold off on additional pulmonary vasodilators.  - Continue Adcirca 40 mg daily.  - Continue 02 at 2 L and BiPAP at night for OHS/OSA.  - She will have a 6 minute walk soon at appt with Dr. MLake Bells so I will not make her do one today also.   Followup 6 months with echo.   DLoralie Champagne4/29/2019

## 2018-01-01 NOTE — Patient Instructions (Signed)
Routine lab work today. Will notify you of abnormal results  Follow up in 6 months with Dr.McLean  **please call our office at [phone] in October to schedule November appointment**

## 2018-01-02 DIAGNOSIS — J9610 Chronic respiratory failure, unspecified whether with hypoxia or hypercapnia: Secondary | ICD-10-CM | POA: Diagnosis not present

## 2018-01-02 DIAGNOSIS — R269 Unspecified abnormalities of gait and mobility: Secondary | ICD-10-CM | POA: Diagnosis not present

## 2018-01-02 DIAGNOSIS — I5040 Unspecified combined systolic (congestive) and diastolic (congestive) heart failure: Secondary | ICD-10-CM | POA: Diagnosis not present
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
## 2018-01-10 ENCOUNTER — Encounter: Payer: Self-pay | Admitting: Internal Medicine

## 2018-01-10 ENCOUNTER — Ambulatory Visit (INDEPENDENT_AMBULATORY_CARE_PROVIDER_SITE_OTHER): Payer: PPO | Admitting: Internal Medicine

## 2018-01-10 VITALS — BP 122/74 | HR 103 | Temp 97.7°F | Wt 227.4 lb

## 2018-01-10 DIAGNOSIS — R7301 Impaired fasting glucose: Secondary | ICD-10-CM | POA: Diagnosis not present

## 2018-01-10 DIAGNOSIS — Z79899 Other long term (current) drug therapy: Secondary | ICD-10-CM

## 2018-01-10 DIAGNOSIS — I272 Pulmonary hypertension, unspecified: Secondary | ICD-10-CM | POA: Diagnosis not present

## 2018-01-10 DIAGNOSIS — I5032 Chronic diastolic (congestive) heart failure: Secondary | ICD-10-CM | POA: Diagnosis not present

## 2018-01-10 DIAGNOSIS — E039 Hypothyroidism, unspecified: Secondary | ICD-10-CM

## 2018-01-10 DIAGNOSIS — D519 Vitamin B12 deficiency anemia, unspecified: Secondary | ICD-10-CM | POA: Diagnosis not present

## 2018-01-10 DIAGNOSIS — J9610 Chronic respiratory failure, unspecified whether with hypoxia or hypercapnia: Secondary | ICD-10-CM | POA: Diagnosis not present

## 2018-01-10 DIAGNOSIS — I10 Essential (primary) hypertension: Secondary | ICD-10-CM | POA: Diagnosis not present

## 2018-01-10 MED ORDER — CYANOCOBALAMIN 1000 MCG/ML IJ SOLN
1000.0000 ug | Freq: Once | INTRAMUSCULAR | Status: AC
  Administered 2018-01-10: 1000 ug via INTRAMUSCULAR

## 2018-01-10 NOTE — Patient Instructions (Addendum)
Glad you are doing  Ok .   Continue lifestyle intervention healthy eating and exercise .    Due for thyroid   Lipids and liver panel  At next blood draw.   Can make  wellness visit with Wynetta Fines our health coach    And can do blood  Work  the same day  LIPIDs .Marland Kitchen  TSH  And   Liver panel  And hg a1c .

## 2018-01-16 ENCOUNTER — Encounter: Payer: Self-pay | Admitting: Pulmonary Disease

## 2018-01-16 ENCOUNTER — Ambulatory Visit (INDEPENDENT_AMBULATORY_CARE_PROVIDER_SITE_OTHER): Payer: PPO | Admitting: *Deleted

## 2018-01-16 ENCOUNTER — Ambulatory Visit: Payer: PPO | Admitting: Pulmonary Disease

## 2018-01-16 VITALS — BP 110/70 | HR 87 | Ht 65.5 in | Wt 226.0 lb

## 2018-01-16 DIAGNOSIS — I272 Pulmonary hypertension, unspecified: Secondary | ICD-10-CM | POA: Diagnosis not present

## 2018-01-16 DIAGNOSIS — Z7901 Long term (current) use of anticoagulants: Secondary | ICD-10-CM | POA: Diagnosis not present

## 2018-01-16 DIAGNOSIS — I5032 Chronic diastolic (congestive) heart failure: Secondary | ICD-10-CM | POA: Diagnosis not present

## 2018-01-16 DIAGNOSIS — G4733 Obstructive sleep apnea (adult) (pediatric): Secondary | ICD-10-CM | POA: Diagnosis not present

## 2018-01-16 DIAGNOSIS — E662 Morbid (severe) obesity with alveolar hypoventilation: Secondary | ICD-10-CM | POA: Diagnosis not present

## 2018-01-16 NOTE — Assessment & Plan Note (Signed)
Continue current treatment plan Follow-up with Dr. Lake Bells in 3 months Overnight oxygenation study on CPAP

## 2018-01-16 NOTE — Assessment & Plan Note (Signed)
Continue to work on healthy weight Follow-up in 3 months with Dr. Lake Bells ONO with CPAP off oxygen

## 2018-01-16 NOTE — Assessment & Plan Note (Signed)
To notify primary care as well as cardiology regarding supplements that she is taking

## 2018-01-16 NOTE — Progress Notes (Signed)
Reviewed, agree
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
## 2018-01-16 NOTE — Assessment & Plan Note (Signed)
Maintain close follow-up with cardiology Monitor for lower extremity swelling Keep sodium intake under 2000 mg a day Continue to work on diet and exercise

## 2018-01-16 NOTE — Patient Instructions (Signed)
Continue hard work with your diet and exercise Overnight study to check oxygen saturations with CPAP use Continue close follow-up with your cardiologist, primary care, and pulmonary Follow-up in 3 months to see Dr. Lake Bells If you have any questions or concerns please follow-up with [place]     Please contact the office if your symptoms worsen or you have concerns that you are not improving.   Thank you for choosing [facility] for your healthcare, and for allowing [place] to partner with you on your healthcare journey. I am thankful to be able to provide care to you today.   Wyn Quaker FNP-C

## 2018-01-17 ENCOUNTER — Other Ambulatory Visit: Payer: Self-pay | Admitting: Internal Medicine
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
## 2018-01-24 ENCOUNTER — Ambulatory Visit (INDEPENDENT_AMBULATORY_CARE_PROVIDER_SITE_OTHER): Payer: PPO

## 2018-01-24 ENCOUNTER — Other Ambulatory Visit (INDEPENDENT_AMBULATORY_CARE_PROVIDER_SITE_OTHER): Payer: PPO

## 2018-01-24 VITALS — BP 104/54 | HR 59 | Ht 61.5 in | Wt 229.0 lb

## 2018-01-24 DIAGNOSIS — D519 Vitamin B12 deficiency anemia, unspecified: Secondary | ICD-10-CM

## 2018-01-24 DIAGNOSIS — Z1159 Encounter for screening for other viral diseases: Secondary | ICD-10-CM

## 2018-01-24 DIAGNOSIS — Z1211 Encounter for screening for malignant neoplasm of colon: Secondary | ICD-10-CM

## 2018-01-24 DIAGNOSIS — I10 Essential (primary) hypertension: Secondary | ICD-10-CM | POA: Diagnosis not present

## 2018-01-24 DIAGNOSIS — E039 Hypothyroidism, unspecified: Secondary | ICD-10-CM

## 2018-01-24 DIAGNOSIS — Z79899 Other long term (current) drug therapy: Secondary | ICD-10-CM

## 2018-01-24 DIAGNOSIS — Z0000 Encounter for general adult medical examination without abnormal findings: Secondary | ICD-10-CM | POA: Diagnosis not present

## 2018-01-24 DIAGNOSIS — R7301 Impaired fasting glucose: Secondary | ICD-10-CM | POA: Diagnosis not present

## 2018-01-24 DIAGNOSIS — I272 Pulmonary hypertension, unspecified: Secondary | ICD-10-CM

## 2018-01-24 DIAGNOSIS — J9610 Chronic respiratory failure, unspecified whether with hypoxia or hypercapnia: Secondary | ICD-10-CM

## 2018-01-24 LAB — LIPID PANEL
CHOL/HDL RATIO: 4
Cholesterol: 206 mg/dL — ABNORMAL HIGH (ref 0–200)
HDL: 55.2 mg/dL (ref >39.00)
LDL CALC: 123 mg/dL — AB (ref 0–99)
NonHDL: 150.3
Triglycerides: 137 mg/dL (ref 0.0–149.0)
VLDL: 27.4 mg/dL (ref 0.0–40.0)

## 2018-01-24 LAB — TSH: TSH: 2.08 u[IU]/mL (ref 0.35–4.50)

## 2018-01-24 LAB — HEMOGLOBIN A1C: Hgb A1c MFr Bld: 5.4 % (ref 4.6–6.5)

## 2018-01-24 NOTE — Patient Instructions (Addendum)
[name] , Thank you for taking time to come for your Medicare Wellness Visit. I appreciate your ongoing commitment to your health goals. Please review the following plan we discussed and let me know if I can assist you in the future.   Will schedule mammogram; can call Manuela Schwartz for any issues; [phone] and Tue, wed and Friday   Ask Dr. Ellie Lunch to send Dr. Regis Bill the report   Educated regarding prediabetes and numbers;  A1c ranges from 5.8 to 6.5 or fasting Blood sugar > 115 -126; (126 is diabetic)    Risk: >45yo; family hx; overweight or obese; African American; Hispanic; Latino; American [place] American; [place]; history of diabetes when pregnant; or birth to a baby weighing over 9 lbs. Being less physically active than 30 minutes; 3 times a week;   Prevention; Losing a modest 7 to 8 lbs; If over 200 lbs; 10 to 14 lbs;  Choose healthier foods; colorful veggies; fish or lean meats; drinks water Reduce portion size Start exercising; 30 minutes of fast walking x 30 minutes per day/ 60 min for weight loss   Will try to complete AD; Given copy  Referred to [facility] for questions [place] offers free advance directive forms, as well as assistance in completing the forms themselves. For assistance, contact the Spiritual Care Department at [phone], or the Clinical Social Work Department at [phone].        These are the goals we discussed: Goals    . Exercise 150 min/wk Moderate Activity     Keep exercising and building your plan one day at a time. Weight loss  Tries not to eat at hs  Watches sodium          This is a list of the screening recommended for you and due dates:  Health Maintenance  Topic Date Due  .  Hepatitis C: One time screening is recommended by Center for Disease Control  (CDC) for  adults born from [birth year] through 1967.   1950/10/23  . Complete foot exam   08/20/1959  . Eye exam for diabetics  08/20/1959  . Urine Protein Check   07/09/2010  . Colon Cancer Screening  09/05/2014  . Mammogram  12/14/2016  . Hemoglobin A1C  03/29/2018  . Flu Shot  04/05/2018  . Tetanus Vaccine  07/17/2023  . DEXA scan (bone density measurement)  Completed  . Pneumonia vaccines  Completed      Fall Prevention in the Home Falls can cause injuries. They can happen to people of all ages. There are many things you can do to make your home safe and to help prevent falls. What can I do on the outside of my home?  Regularly fix the edges of walkways and driveways and fix any cracks.  Remove anything that might make you trip as you walk through a door, such as a raised step or threshold.  Trim any bushes or trees on the path to your home.  Use bright outdoor lighting.  Clear any walking paths of anything that might make someone trip, such as rocks or tools.  Regularly check to see if handrails are loose or broken. Make sure that both sides of any steps have handrails.  Any raised decks and porches should have guardrails on the edges.  Have any leaves, snow, or ice cleared regularly.  Use sand or salt on walking paths during winter.  Clean up any spills in your garage right away. This includes oil or grease  spills. What can I do in the bathroom?  Use night lights.  Install grab bars by the toilet and in the tub and shower. Do not use towel bars as grab bars.  Use non-skid mats or decals in the tub or shower.  If you need to sit down in the shower, use a plastic, non-slip stool.  Keep the floor dry. Clean up any water that spills on the floor as soon as it happens.  Remove soap buildup in the tub or shower regularly.  Attach bath mats securely with double-sided non-slip rug tape.  Do not have throw rugs and other things on the floor that can make you trip. What can I do in the bedroom?  Use night lights.  Make sure that you have a light by your bed that is easy to reach.  Do not use any sheets or blankets that are  too big for your bed. They should not hang down onto the floor.  Have a firm chair that has side arms. You can use this for support while you get dressed.  Do not have throw rugs and other things on the floor that can make you trip. What can I do in the kitchen?  Clean up any spills right away.  Avoid walking on wet floors.  Keep items that you use a lot in easy-to-reach places.  If you need to reach something above you, use a strong step stool that has a grab bar.  Keep electrical cords out of the way.  Do not use floor polish or wax that makes floors slippery. If you must use wax, use non-skid floor wax.  Do not have throw rugs and other things on the floor that can make you trip. What can I do with my stairs?  Do not leave any items on the stairs.  Make sure that there are handrails on both sides of the stairs and use them. Fix handrails that are broken or loose. Make sure that handrails are as long as the stairways.  Check any carpeting to make sure that it is firmly attached to the stairs. Fix any carpet that is loose or worn.  Avoid having throw rugs at the top or bottom of the stairs. If you do have throw rugs, attach them to the floor with carpet tape.  Make sure that you have a light switch at the top of the stairs and the bottom of the stairs. If you do not have them, ask someone to add them for you. What else can I do to help prevent falls?  Wear shoes that: ? Do not have high heels. ? Have rubber bottoms. ? Are comfortable and fit you well. ? Are closed at the toe. Do not wear sandals.  If you use a stepladder: ? Make sure that it is fully opened. Do not climb a closed stepladder. ? Make sure that both sides of the stepladder are locked into place. ? Ask someone to hold it for you, if possible.  Clearly mark and make sure that you can see: ? Any grab bars or handrails. ? First and last steps. ? Where the edge of each step is.  Use tools that help you move  around (mobility aids) if they are needed. These include: ? Canes. ? Walkers. ? Scooters. ? Crutches.  Turn on the lights when you go into a dark area. Replace any light bulbs as soon as they burn out.  Set up your furniture so you have a clear  path. Avoid moving your furniture around.  If any of your floors are uneven, fix them.  If there are any pets around you, be aware of where they are.  Review your medicines with your doctor. Some medicines can make you feel dizzy. This can increase your chance of falling. Ask your doctor what other things that you can do to help prevent falls. This information is not intended to replace advice given to you by your health care provider. Make sure you discuss any questions you have with your health care provider. Document Released: 06/18/2009 Document Revised: 01/28/2016 Document Reviewed: 09/26/2014 Elsevier Interactive Patient Education  2018 [place] Maintenance, Female Adopting a healthy lifestyle and getting preventive care can go a long way to promote health and wellness. Talk with your health care provider about what schedule of regular examinations is right for you. This is a good chance for you to check in with your provider about disease prevention and staying healthy. In between checkups, there are plenty of things you can do on your own. Experts have done a lot of research about which lifestyle changes and preventive measures are most likely to keep you healthy. Ask your health care provider for more information. Weight and diet Eat a healthy diet  Be sure to include plenty of vegetables, fruits, low-fat dairy products, and lean protein.  Do not eat a lot of foods high in solid fats, added sugars, or salt.  Get regular exercise. This is one of the most important things you can do for your health. ? Most adults should exercise for at least 150 minutes each week. The exercise should increase your heart rate and make you  sweat (moderate-intensity exercise). ? Most adults should also do strengthening exercises at least twice a week. This is in addition to the moderate-intensity exercise.  Maintain a healthy weight  Body mass index (BMI) is a measurement that can be used to identify possible weight problems. It estimates body fat based on height and weight. Your health care provider can help determine your BMI and help you achieve or maintain a healthy weight.  For females 39 years of age and older: ? A BMI below 18.5 is considered underweight. ? A BMI of 18.5 to 24.9 is normal. ? A BMI of 25 to 29.9 is considered overweight. ? A BMI of 30 and above is considered obese.  Watch levels of cholesterol and blood lipids  You should start having your blood tested for lipids and cholesterol at 69 years of age, then have this test every 5 years.  You may need to have your cholesterol levels checked more often if: ? Your lipid or cholesterol levels are high. ? You are older than 69 years of age. ? You are at high risk for heart disease.  Cancer screening Lung Cancer  Lung cancer screening is recommended for adults 1-46 years old who are at high risk for lung cancer because of a history of smoking.  A yearly low-dose CT scan of the lungs is recommended for people who: ? Currently smoke. ? Have quit within the past 15 years. ? Have at least a 30-pack-year history of smoking. A pack year is smoking an average of one pack of cigarettes a day for 1 year.  Yearly screening should continue until it has been 15 years since you quit.  Yearly screening should stop if you develop a health problem that would prevent you from having lung cancer treatment.  Breast Cancer  Practice  breast self-awareness. This means understanding how your breasts normally appear and feel.  It also means doing regular breast self-exams. Let your health care provider know about any changes, no matter how small.  If you are in your 20s  or 30s, you should have a clinical breast exam (CBE) by a health care provider every 1-3 years as part of a regular health exam.  If you are 31 or older, have a CBE every year. Also consider having a breast X-ray (mammogram) every year.  If you have a family history of breast cancer, talk to your health care provider about genetic screening.  If you are at high risk for breast cancer, talk to your health care provider about having an MRI and a mammogram every year.  Breast cancer gene (BRCA) assessment is recommended for women who have family members with BRCA-related cancers. BRCA-related cancers include: ? Breast. ? Ovarian. ? Tubal. ? Peritoneal cancers.  Results of the assessment will determine the need for genetic counseling and BRCA1 and BRCA2 testing.  Cervical Cancer Your health care provider may recommend that you be screened regularly for cancer of the pelvic organs (ovaries, uterus, and vagina). This screening involves a pelvic examination, including checking for microscopic changes to the surface of your cervix (Pap test). You may be encouraged to have this screening done every 3 years, beginning at age 58.  For women ages 77-65, health care providers may recommend pelvic exams and Pap testing every 3 years, or they may recommend the Pap and pelvic exam, combined with testing for human papilloma virus (HPV), every 5 years. Some types of HPV increase your risk of cervical cancer. Testing for HPV may also be done on women of any age with unclear Pap test results.  Other health care providers may not recommend any screening for nonpregnant women who are considered low risk for pelvic cancer and who do not have symptoms. Ask your health care provider if a screening pelvic exam is right for you.  If you have had past treatment for cervical cancer or a condition that could lead to cancer, you need Pap tests and screening for cancer for at least 20 years after your treatment. If Pap tests  have been discontinued, your risk factors (such as having a new sexual partner) need to be reassessed to determine if screening should resume. Some women have medical problems that increase the chance of getting cervical cancer. In these cases, your health care provider may recommend more frequent screening and Pap tests.  Colorectal Cancer  This type of cancer can be detected and often prevented.  Routine colorectal cancer screening usually begins at 69 years of age and continues through 69 years of age.  Your health care provider may recommend screening at an earlier age if you have risk factors for colon cancer.  Your health care provider may also recommend using home test kits to check for hidden blood in the stool.  A small camera at the end of a tube can be used to examine your colon directly (sigmoidoscopy or colonoscopy). This is done to check for the earliest forms of colorectal cancer.  Routine screening usually begins at age 38.  Direct examination of the colon should be repeated every 5-10 years through 69 years of age. However, you may need to be screened more often if early forms of precancerous polyps or small growths are found.  Skin Cancer  Check your skin from head to toe regularly.  Tell your health care provider  about any new moles or changes in moles, especially if there is a change in a mole's shape or color.  Also tell your health care provider if you have a mole that is larger than the size of a pencil eraser.  Always use sunscreen. Apply sunscreen liberally and repeatedly throughout the day.  Protect yourself by wearing long sleeves, pants, a wide-brimmed hat, and sunglasses whenever you are outside.  Heart disease, diabetes, and high blood pressure  High blood pressure causes heart disease and increases the risk of stroke. High blood pressure is more likely to develop in: ? People who have blood pressure in the high end of the normal range (130-139/85-89 mm  Hg). ? People who are overweight or obese. ? People who are African American.  If you are 41-47 years of age, have your blood pressure checked every 3-5 years. If you are 44 years of age or older, have your blood pressure checked every year. You should have your blood pressure measured twice-once when you are at a hospital or clinic, and once when you are not at a hospital or clinic. Record the average of the two measurements. To check your blood pressure when you are not at a hospital or clinic, you can use: ? An automated blood pressure machine at a pharmacy. ? A home blood pressure monitor.  If you are between 92 years and 60 years old, ask your health care provider if you should take aspirin to prevent strokes.  Have regular diabetes screenings. This involves taking a blood sample to check your fasting blood sugar level. ? If you are at a normal weight and have a low risk for diabetes, have this test once every three years after 69 years of age. ? If you are overweight and have a high risk for diabetes, consider being tested at a younger age or more often. Preventing infection Hepatitis B  If you have a higher risk for hepatitis B, you should be screened for this virus. You are considered at high risk for hepatitis B if: ? You were born in a country where hepatitis B is common. Ask your health care provider which countries are considered high risk. ? Your parents were born in a high-risk country, and you have not been immunized against hepatitis B (hepatitis B vaccine). ? You have HIV or AIDS. ? You use needles to inject street drugs. ? You live with someone who has hepatitis B. ? You have had sex with someone who has hepatitis B. ? You get hemodialysis treatment. ? You take certain medicines for conditions, including cancer, organ transplantation, and autoimmune conditions.  Hepatitis C  Blood testing is recommended for: ? Everyone born from [birth year] through 1965. ? Anyone with known  risk factors for hepatitis C.  Sexually transmitted infections (STIs)  You should be screened for sexually transmitted infections (STIs) including gonorrhea and chlamydia if: ? You are sexually active and are younger than 69 years of age. ? You are older than 70 years of age and your health care provider tells you that you are at risk for this type of infection. ? Your sexual activity has changed since you were last screened and you are at an increased risk for chlamydia or gonorrhea. Ask your health care provider if you are at risk.  If you do not have HIV, but are at risk, it may be recommended that you take a prescription medicine daily to prevent HIV infection. This is called pre-exposure prophylaxis (PrEP). You are  considered at risk if: ? You are sexually active and do not regularly use condoms or know the HIV status of your partner(s). ? You take drugs by injection. ? You are sexually active with a partner who has HIV.  Talk with your health care provider about whether you are at high risk of being infected with HIV. If you choose to begin PrEP, you should first be tested for HIV. You should then be tested every 3 months for as long as you are taking PrEP. Pregnancy  If you are premenopausal and you may become pregnant, ask your health care provider about preconception counseling.  If you may become pregnant, take 400 to 800 micrograms (mcg) of folic acid every day.  If you want to prevent pregnancy, talk to your health care provider about birth control (contraception). Osteoporosis and menopause  Osteoporosis is a disease in which the bones lose minerals and strength with aging. This can result in serious bone fractures. Your risk for osteoporosis can be identified using a bone density scan.  If you are 81 years of age or older, or if you are at risk for osteoporosis and fractures, ask your health care provider if you should be screened.  Ask your health care provider whether you  should take a calcium or vitamin D supplement to lower your risk for osteoporosis.  Menopause may have certain physical symptoms and risks.  Hormone replacement therapy may reduce some of these symptoms and risks. Talk to your health care provider about whether hormone replacement therapy is right for you. Follow these instructions at home:  Schedule regular health, dental, and eye exams.  Stay current with your immunizations.  Do not use any tobacco products including cigarettes, chewing tobacco, or electronic cigarettes.  If you are pregnant, do not drink alcohol.  If you are breastfeeding, limit how much and how often you drink alcohol.  Limit alcohol intake to no more than 1 drink per day for nonpregnant women. One drink equals 12 ounces of beer, 5 ounces of wine, or 1 ounces of hard liquor.  Do not use street drugs.  Do not share needles.  Ask your health care provider for help if you need support or information about quitting drugs.  Tell your health care provider if you often feel depressed.  Tell your health care provider if you have ever been abused or do not feel safe at home. This information is not intended to replace advice given to you by your health care provider. Make sure you discuss any questions you have with your health care provider. Document Released: 03/07/2011 Document Revised: 01/28/2016 Document Reviewed: 05/26/2015 Elsevier Interactive Patient Education  [employer].

## 2018-01-28 DIAGNOSIS — G4733 Obstructive sleep apnea (adult) (pediatric): Secondary | ICD-10-CM | POA: Diagnosis not present

## 2018-01-28 DIAGNOSIS — I5040 Unspecified combined systolic (congestive) and diastolic (congestive) heart failure: Secondary | ICD-10-CM | POA: Diagnosis not present

## 2018-01-28 DIAGNOSIS — J9610 Chronic respiratory failure, unspecified whether with hypoxia or hypercapnia: Secondary | ICD-10-CM | POA: Diagnosis not present

## 2018-01-28 DIAGNOSIS — R269 Unspecified abnormalities of gait and mobility: Secondary | ICD-10-CM | POA: Diagnosis not present

## 2018-01-29 ENCOUNTER — Other Ambulatory Visit: Payer: Self-pay | Admitting: Cardiovascular Disease

## 2018-02-01 DIAGNOSIS — J9610 Chronic respiratory failure, unspecified whether with hypoxia or hypercapnia: Secondary | ICD-10-CM | POA: Diagnosis not present

## 2018-02-01 DIAGNOSIS — R269 Unspecified abnormalities of gait and mobility: Secondary | ICD-10-CM | POA: Diagnosis not present

## 2018-02-01 DIAGNOSIS — I5040 Unspecified combined systolic (congestive) and diastolic (congestive) heart failure: Secondary | ICD-10-CM | POA: Diagnosis not present

## 2018-02-02 ENCOUNTER — Other Ambulatory Visit: Payer: Self-pay | Admitting: Internal Medicine

## 2018-02-02 DIAGNOSIS — I5040 Unspecified combined systolic (congestive) and diastolic (congestive) heart failure: Secondary | ICD-10-CM | POA: Diagnosis not present

## 2018-02-02 DIAGNOSIS — J9610 Chronic respiratory failure, unspecified whether with hypoxia or hypercapnia: Secondary | ICD-10-CM | POA: Diagnosis not present

## 2018-02-02 DIAGNOSIS — R269 Unspecified abnormalities of gait and mobility: Secondary | ICD-10-CM | POA: Diagnosis not present

## 2018-02-02 DIAGNOSIS — G4733 Obstructive sleep apnea (adult) (pediatric): Secondary | ICD-10-CM | POA: Diagnosis not present

## 2018-02-02 NOTE — Telephone Encounter (Signed)
Copied from [place] [id number]. Topic: Quick Communication - See Telephone Encounter >> Feb 02, 2018 12:01 PM Hewitt Shorts wrote: [facility] [address] [facility] check on a status of her tramadol rx   [facility] [address] [phone]

## 2018-02-05 NOTE — Telephone Encounter (Signed)
Tramadol refill Last Refill:10/07/17 # 90 Last OV: 01/10/18 PCP: Dr. Regis Bill Pharmacy: [facility] [address]

## 2018-02-07 MED ORDER — TRAMADOL HCL 50 MG PO TABS: 50.0000 mg | ORAL_TABLET | Freq: Three times a day (TID) | ORAL | 0 refills | Status: DC | PRN

## 2018-02-07 NOTE — Telephone Encounter (Signed)
Call in #60 with no rf

## 2018-02-07 NOTE — Telephone Encounter (Signed)
[facility]  Pt aware this has been called in.  Nothing further needed.

## 2018-02-07 NOTE — Telephone Encounter (Signed)
Please advise Dr Sarajane Jews if able to refill in Dr Velora Mediate absence. Thanks.

## 2018-02-08 ENCOUNTER — Telehealth: Payer: Self-pay

## 2018-02-08 ENCOUNTER — Telehealth (HOSPITAL_COMMUNITY): Payer: Self-pay

## 2018-02-08 DIAGNOSIS — R0902 Hypoxemia: Secondary | ICD-10-CM | POA: Diagnosis not present

## 2018-02-08 DIAGNOSIS — I5032 Chronic diastolic (congestive) heart failure: Secondary | ICD-10-CM

## 2018-02-08 DIAGNOSIS — J449 Chronic obstructive pulmonary disease, unspecified: Secondary | ICD-10-CM | POA: Diagnosis not present

## 2018-02-08 NOTE — Telephone Encounter (Signed)
Patient called and wants to know where this information comes from. She states that she is not and has not ever been a diabetic. She has researched the ASCVD Risk Score and if it is corrected to show she is not a diabetic her score will improve. Pt is aware Dr. Regis Bill is out of office this week. She says there is no rush.    The 10-year ASCVD risk score Mikey Bussing DC Brooke Bonito., et al., 2013) is: 13% Values used to calculate the score:  Age: 67 years  Sex: Female  Is Non-Hispanic African American: No  Diabetic: Yes  Tobacco smoker: No  Systolic Blood Pressure: 820 mmHg  Is BP treated: Yes  HDL Cholesterol: 55.2 mg/dL  Total Cholesterol: 206 mg/dL  Dr. Regis Bill - Please advise. I am not sure where this assessment pulls the clinical information from or how to correct it. Thanks!

## 2018-02-08 NOTE — Telephone Encounter (Signed)
She has history of CVA.  Would start Crestor 5 mg daily with lipids/LFTs in 2 months.

## 2018-02-08 NOTE — Telephone Encounter (Signed)
Patient calling to report PCP checked lipid panel and cholesterol elevated.  PCP advised to reach out to [name] for recommendations to treat. Lipid panel forwarded to [name] to review.  Renee Pain, RN

## 2018-02-09 ENCOUNTER — Encounter: Payer: Self-pay | Admitting: Pulmonary Disease

## 2018-02-13 ENCOUNTER — Other Ambulatory Visit: Payer: Self-pay | Admitting: Internal Medicine

## 2018-02-13 DIAGNOSIS — Z1231 Encounter for screening mammogram for malignant neoplasm of breast: Secondary | ICD-10-CM

## 2018-02-14 NOTE — Telephone Encounter (Signed)
I agree  With   [name]    And tell her  This    I have never diagnosed her with  DM  It is possible probably  that the EHR is flagging the code pre diabetes as diabetes .  Thanks for the pick up   Will work on  Figuring out why this is happening .   Please send a ticket to IT to   Correct  And not ID her as diabetic   ( the ascvd  Calculation AND the health maintenance if in correct ) .    (If we need to  Stop using pre diabetes code  We will.... And change to  Hyperglycemia  Or elevated blood sugar )

## 2018-02-15 NOTE — Telephone Encounter (Signed)
IT ticket placed.  Health maintenance diabetes mellitus modifier removed. Rechecked, and ASCVD score still tagging patient as diabetic. Will notify patient of below.

## 2018-02-15 NOTE — Telephone Encounter (Signed)
Pt notified of results/instructions and verbalized understanding. She was very appreciative of the call.

## 2018-02-20 ENCOUNTER — Telehealth: Payer: Self-pay

## 2018-02-20 MED ORDER — ROSUVASTATIN CALCIUM 5 MG PO TABS: 5.0000 mg | ORAL_TABLET | Freq: Every day | ORAL | 11 refills | Status: DC

## 2018-02-20 NOTE — Telephone Encounter (Signed)
Patient aware and agreeable, Rx sent to preferred pharm electronically, lab added on to Dr. Anastasia Pall appt in August.  Leory Plowman, Guinevere Ferrari, RN

## 2018-02-20 NOTE — Telephone Encounter (Signed)
Attempted to call patient to relay results, no answer, message left to call back.

## 2018-02-20 NOTE — Addendum Note (Signed)
Addended by: Effie Berkshire on: 02/20/2018 02:45 PM   Modules accepted: Orders

## 2018-02-20 NOTE — Telephone Encounter (Signed)
-----  Message from Juanito Doom, MD sent at 02/20/2018  3:41 PM EDT ----- Hi,  Please let her know that her CPAP on Room air was normal  Thanks, Ruby Cola

## 2018-02-21 NOTE — Telephone Encounter (Signed)
Called pt stating to continue O2 2L prn until we do a qualifying walk to see if she still needs the O2.  Pt expressed understanding. Asked pt if she wanted to schedule a qualifying walk and pt states at this time she does not want to schedule a qualifying walk.  Pt has an appt coming up in August with BQ and said at that visit a qualifying walk could be done to see if she needs the O2.  Pt states when she is exercising, her O2 does drop in the low 90s and she feels like she needs the O2.  I stated to pt that qualifying sats for O2 is below 90%. Pt expressed understanding and stated she does not want to give the O2 up yet and stated at her OV, we could do a walk to see what her saturations are during the walk.  Nothing further needed.

## 2018-02-21 NOTE — Telephone Encounter (Signed)
Yes, until she has a qualifying walk that shows her oxygen level is normal.  Can do in our office if she wants to schedule.

## 2018-02-21 NOTE — Telephone Encounter (Signed)
Pt is calling back  [phone]

## 2018-02-21 NOTE — Telephone Encounter (Signed)
Pt is aware of below results and voiced her understanding. pt had questions regarding when oxygen should be worn. Should she continue 2L O2 during the day PRN?   BQ please advise.

## 2018-02-22 ENCOUNTER — Encounter (HOSPITAL_COMMUNITY): Payer: Self-pay

## 2018-02-22 NOTE — Progress Notes (Signed)
[employer] approved of $5300 for 03/20/2018-03/20/2019.

## 2018-02-28 DIAGNOSIS — G4733 Obstructive sleep apnea (adult) (pediatric): Secondary | ICD-10-CM | POA: Diagnosis not present

## 2018-02-28 DIAGNOSIS — J9610 Chronic respiratory failure, unspecified whether with hypoxia or hypercapnia: Secondary | ICD-10-CM | POA: Diagnosis not present

## 2018-02-28 DIAGNOSIS — I5040 Unspecified combined systolic (congestive) and diastolic (congestive) heart failure: Secondary | ICD-10-CM | POA: Diagnosis not present

## 2018-02-28 DIAGNOSIS — R269 Unspecified abnormalities of gait and mobility: Secondary | ICD-10-CM | POA: Diagnosis not present

## 2018-03-04 DIAGNOSIS — I5040 Unspecified combined systolic (congestive) and diastolic (congestive) heart failure: Secondary | ICD-10-CM | POA: Diagnosis not present

## 2018-03-04 DIAGNOSIS — R269 Unspecified abnormalities of gait and mobility: Secondary | ICD-10-CM | POA: Diagnosis not present

## 2018-03-04 DIAGNOSIS — J9610 Chronic respiratory failure, unspecified whether with hypoxia or hypercapnia: Secondary | ICD-10-CM | POA: Diagnosis not present

## 2018-03-07 ENCOUNTER — Ambulatory Visit
Admission: RE | Admit: 2018-03-07 | Discharge: 2018-03-07 | Disposition: A | Discharge status: Home / Self Care | Payer: PPO | Source: Ambulatory Visit | Attending: Internal Medicine | Admitting: Internal Medicine

## 2018-03-07 DIAGNOSIS — Z1231 Encounter for screening mammogram for malignant neoplasm of breast: Secondary | ICD-10-CM | POA: Diagnosis not present

## 2018-03-26 ENCOUNTER — Other Ambulatory Visit: Payer: Self-pay | Admitting: Internal Medicine

## 2018-03-26 NOTE — Telephone Encounter (Signed)
Last filled 02/07/18 #60 x 0rf Last OV 01/10/18, due back in Nov 2019 for 93mof/u  Please advise Dr PRegis Bill thanks.

## 2018-03-27 NOTE — Telephone Encounter (Signed)
Sent in electronically .  

## 2018-03-30 DIAGNOSIS — G4733 Obstructive sleep apnea (adult) (pediatric): Secondary | ICD-10-CM | POA: Diagnosis not present

## 2018-03-30 DIAGNOSIS — J9610 Chronic respiratory failure, unspecified whether with hypoxia or hypercapnia: Secondary | ICD-10-CM | POA: Diagnosis not present

## 2018-03-30 DIAGNOSIS — R269 Unspecified abnormalities of gait and mobility: Secondary | ICD-10-CM | POA: Diagnosis not present

## 2018-03-30 DIAGNOSIS — I5040 Unspecified combined systolic (congestive) and diastolic (congestive) heart failure: Secondary | ICD-10-CM | POA: Diagnosis not present

## 2018-04-03 DIAGNOSIS — R269 Unspecified abnormalities of gait and mobility: Secondary | ICD-10-CM | POA: Diagnosis not present

## 2018-04-03 DIAGNOSIS — J9610 Chronic respiratory failure, unspecified whether with hypoxia or hypercapnia: Secondary | ICD-10-CM | POA: Diagnosis not present

## 2018-04-03 DIAGNOSIS — I5040 Unspecified combined systolic (congestive) and diastolic (congestive) heart failure: Secondary | ICD-10-CM | POA: Diagnosis not present

## 2018-04-08 ENCOUNTER — Other Ambulatory Visit (HOSPITAL_COMMUNITY): Payer: Self-pay | Admitting: Internal Medicine

## 2018-04-11 ENCOUNTER — Other Ambulatory Visit: Payer: Self-pay | Admitting: Internal Medicine

## 2018-04-13 ENCOUNTER — Other Ambulatory Visit: Payer: Self-pay | Admitting: Internal Medicine

## 2018-04-25 ENCOUNTER — Other Ambulatory Visit: Payer: PPO

## 2018-04-25 ENCOUNTER — Ambulatory Visit: Payer: PPO | Admitting: Pulmonary Disease

## 2018-04-25 ENCOUNTER — Encounter: Payer: Self-pay | Admitting: Pulmonary Disease

## 2018-04-25 VITALS — BP 114/60 | HR 70 | Ht 60.0 in | Wt 233.6 lb

## 2018-04-25 DIAGNOSIS — I5032 Chronic diastolic (congestive) heart failure: Secondary | ICD-10-CM

## 2018-04-25 DIAGNOSIS — G4733 Obstructive sleep apnea (adult) (pediatric): Secondary | ICD-10-CM

## 2018-04-25 DIAGNOSIS — Z1159 Encounter for screening for other viral diseases: Secondary | ICD-10-CM | POA: Diagnosis not present

## 2018-04-25 DIAGNOSIS — I272 Pulmonary hypertension, unspecified: Secondary | ICD-10-CM | POA: Diagnosis not present

## 2018-04-25 NOTE — Addendum Note (Signed)
Addended by: Dolores Lory on: 04/25/2018 10:16 AM   Modules accepted: Orders

## 2018-04-25 NOTE — Patient Instructions (Signed)
Chronic respiratory failure with hypoxemia: We will check your oxygen level while you are walking today, as long as it stays above 88% on room air we will discontinue the concentrator you have in your home  Obstructive sleep apnea: Keep using CPAP at night with room air  Pulmonary hypertension: Continue Adcirca Continue follow-up with the cardiology clinic Continue to keep a sodium intake less than 2 g a day Continue to monitor fluid intake  Weight gain: The following behaviors have been associated with weight loss: Weigh yourself daily Write down everything you eat Drink a glass of water prior to eating a meal Only eat when you are hungry Buy food from the periphery of the grocery store, not the middle  Follow up in 6 months
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
## 2018-04-26 LAB — HEPATITIS C ANTIBODY: Hep C Virus Ab: <0.1 s/co ratio (ref 0.0–0.9)

## 2018-05-03 DIAGNOSIS — M5136 Other intervertebral disc degeneration, lumbar region: Secondary | ICD-10-CM | POA: Insufficient documentation

## 2018-05-04 DIAGNOSIS — R269 Unspecified abnormalities of gait and mobility: Secondary | ICD-10-CM | POA: Diagnosis not present

## 2018-05-04 DIAGNOSIS — I5040 Unspecified combined systolic (congestive) and diastolic (congestive) heart failure: Secondary | ICD-10-CM | POA: Diagnosis not present

## 2018-05-04 DIAGNOSIS — J9610 Chronic respiratory failure, unspecified whether with hypoxia or hypercapnia: Secondary | ICD-10-CM | POA: Diagnosis not present

## 2018-05-14 ENCOUNTER — Other Ambulatory Visit: Payer: Self-pay

## 2018-05-14 ENCOUNTER — Other Ambulatory Visit: Payer: Self-pay | Admitting: Internal Medicine

## 2018-05-14 DIAGNOSIS — I5040 Unspecified combined systolic (congestive) and diastolic (congestive) heart failure: Secondary | ICD-10-CM | POA: Diagnosis not present

## 2018-05-14 DIAGNOSIS — J9610 Chronic respiratory failure, unspecified whether with hypoxia or hypercapnia: Secondary | ICD-10-CM | POA: Diagnosis not present

## 2018-05-14 DIAGNOSIS — G4733 Obstructive sleep apnea (adult) (pediatric): Secondary | ICD-10-CM | POA: Diagnosis not present

## 2018-05-14 DIAGNOSIS — R269 Unspecified abnormalities of gait and mobility: Secondary | ICD-10-CM | POA: Diagnosis not present

## 2018-05-14 MED ORDER — ATENOLOL 50 MG PO TABS: ORAL_TABLET | ORAL | 0 refills | Status: DC

## 2018-05-14 NOTE — Telephone Encounter (Signed)
Rx(s) sent to pharmacy electronically.

## 2018-05-15 ENCOUNTER — Encounter (HOSPITAL_COMMUNITY): Payer: PPO

## 2018-05-17 NOTE — Telephone Encounter (Signed)
Patient calling to check status of this medication refill. Please advise. [id number]- [place], [place] - [address]

## 2018-05-17 NOTE — Telephone Encounter (Signed)
Dr. Regis Bill, pt is requesting a  Refill of the tramadol.  Please advise. Thanks

## 2018-05-18 NOTE — Telephone Encounter (Signed)
Sent in electronically .  

## 2018-05-24 DIAGNOSIS — M5136 Other intervertebral disc degeneration, lumbar region: Secondary | ICD-10-CM | POA: Diagnosis not present

## 2018-05-31 ENCOUNTER — Other Ambulatory Visit (HOSPITAL_COMMUNITY): Payer: Self-pay | Admitting: Cardiology

## 2018-06-04 DIAGNOSIS — I5040 Unspecified combined systolic (congestive) and diastolic (congestive) heart failure: Secondary | ICD-10-CM | POA: Diagnosis not present

## 2018-06-04 DIAGNOSIS — R269 Unspecified abnormalities of gait and mobility: Secondary | ICD-10-CM | POA: Diagnosis not present

## 2018-06-04 DIAGNOSIS — J9610 Chronic respiratory failure, unspecified whether with hypoxia or hypercapnia: Secondary | ICD-10-CM | POA: Diagnosis not present

## 2018-06-05 ENCOUNTER — Other Ambulatory Visit (HOSPITAL_COMMUNITY): Payer: Self-pay | Admitting: Pharmacist

## 2018-06-05 ENCOUNTER — Other Ambulatory Visit (HOSPITAL_COMMUNITY): Payer: Self-pay | Admitting: Cardiology

## 2018-06-05 MED ORDER — POTASSIUM CHLORIDE ER 10 MEQ PO TBCR: 20.0000 meq | EXTENDED_RELEASE_TABLET | Freq: Two times a day (BID) | ORAL | 5 refills | Status: DC

## 2018-06-26 ENCOUNTER — Other Ambulatory Visit: Payer: Self-pay | Admitting: Internal Medicine

## 2018-06-27 NOTE — Telephone Encounter (Signed)
Upcoming OV 07/09/18 Last refilled 05/18/18 #60  Please advise Dr Regis Bill, thanks.

## 2018-06-29 ENCOUNTER — Other Ambulatory Visit: Payer: Self-pay

## 2018-06-29 NOTE — Telephone Encounter (Signed)
Please advise Dr Panosh, thanks.   

## 2018-07-02 NOTE — Telephone Encounter (Signed)
Sent in electronically .

## 2018-07-04 ENCOUNTER — Other Ambulatory Visit: Payer: Self-pay | Admitting: Internal Medicine
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
## 2018-07-09 ENCOUNTER — Ambulatory Visit (INDEPENDENT_AMBULATORY_CARE_PROVIDER_SITE_OTHER): Payer: PPO | Admitting: Internal Medicine

## 2018-07-09 ENCOUNTER — Other Ambulatory Visit: Payer: Self-pay | Admitting: Internal Medicine

## 2018-07-09 ENCOUNTER — Encounter: Payer: Self-pay | Admitting: Internal Medicine

## 2018-07-09 VITALS — BP 116/68 | HR 95 | Temp 98.2°F | Wt 235.5 lb

## 2018-07-09 DIAGNOSIS — Z7901 Long term (current) use of anticoagulants: Secondary | ICD-10-CM | POA: Diagnosis not present

## 2018-07-09 DIAGNOSIS — E790 Hyperuricemia without signs of inflammatory arthritis and tophaceous disease: Secondary | ICD-10-CM | POA: Diagnosis not present

## 2018-07-09 DIAGNOSIS — Z8739 Personal history of other diseases of the musculoskeletal system and connective tissue: Secondary | ICD-10-CM

## 2018-07-09 DIAGNOSIS — D519 Vitamin B12 deficiency anemia, unspecified: Secondary | ICD-10-CM

## 2018-07-09 DIAGNOSIS — Z79899 Other long term (current) drug therapy: Secondary | ICD-10-CM

## 2018-07-09 DIAGNOSIS — R7303 Prediabetes: Secondary | ICD-10-CM

## 2018-07-09 LAB — LIPID PANEL
CHOLESTEROL: 155 mg/dL (ref 0–200)
HDL: 56.5 mg/dL (ref >39.00)
LDL CALC: 67 mg/dL (ref 0–99)
NonHDL: 98.89
Total CHOL/HDL Ratio: 3
Triglycerides: 160 mg/dL — ABNORMAL HIGH (ref 0.0–149.0)
VLDL: 32 mg/dL (ref 0.0–40.0)

## 2018-07-09 LAB — CBC WITH DIFFERENTIAL/PLATELET
BASOS ABS: 0 10*3/uL (ref 0.0–0.1)
Basophils Relative: 0.5 % (ref 0.0–3.0)
EOS ABS: 0.2 10*3/uL (ref 0.0–0.7)
Eosinophils Relative: 3.3 % (ref 0.0–5.0)
HCT: 40 % (ref 36.0–46.0)
HEMOGLOBIN: 13.2 g/dL (ref 12.0–15.0)
Lymphocytes Relative: 13.7 % (ref 12.0–46.0)
Lymphs Abs: 0.9 10*3/uL (ref 0.7–4.0)
MCHC: 33 g/dL (ref 30.0–36.0)
MCV: 100.5 fl — ABNORMAL HIGH (ref 78.0–100.0)
MONO ABS: 0.5 10*3/uL (ref 0.1–1.0)
Monocytes Relative: 7.2 % (ref 3.0–12.0)
Neutro Abs: 4.9 10*3/uL (ref 1.4–7.7)
Neutrophils Relative %: 75.3 % (ref 43.0–77.0)
Platelets: 140 10*3/uL — ABNORMAL LOW (ref 150.0–400.0)
RBC: 3.98 Mil/uL (ref 3.87–5.11)
RDW: 15.9 % — ABNORMAL HIGH (ref 11.5–15.5)
WBC: 6.5 10*3/uL (ref 4.0–10.5)

## 2018-07-09 LAB — HEPATIC FUNCTION PANEL
ALK PHOS: 119 U/L — AB (ref 39–117)
ALT: 12 U/L (ref 0–35)
AST: 28 U/L (ref 0–37)
Albumin: 4.4 g/dL (ref 3.5–5.2)
BILIRUBIN DIRECT: 0.2 mg/dL (ref 0.0–0.3)
BILIRUBIN TOTAL: 0.9 mg/dL (ref 0.2–1.2)
Total Protein: 6.5 g/dL (ref 6.0–8.3)

## 2018-07-09 LAB — BASIC METABOLIC PANEL
BUN: 24 mg/dL — AB (ref 6–23)
CO2: 26 mEq/L (ref 19–32)
CREATININE: 1.2 mg/dL (ref 0.40–1.20)
Calcium: 9.4 mg/dL (ref 8.4–10.5)
Chloride: 107 mEq/L (ref 96–112)
GFR: 47.36 mL/min — ABNORMAL LOW (ref >60.00)
Glucose, Bld: 101 mg/dL — ABNORMAL HIGH (ref 70–99)
Potassium: 3.9 mEq/L (ref 3.5–5.1)
Sodium: 142 mEq/L (ref 135–145)

## 2018-07-09 LAB — HEMOGLOBIN A1C: HEMOGLOBIN A1C: 5.5 % (ref 4.6–6.5)

## 2018-07-09 LAB — TSH: TSH: 1.64 u[IU]/mL (ref 0.35–4.50)

## 2018-07-09 LAB — VITAMIN B12: Vitamin B-12: 1281 pg/mL — ABNORMAL HIGH (ref 211–911)

## 2018-07-09 LAB — URIC ACID: Uric Acid, Serum: 4.2 mg/dL (ref 2.4–7.0)

## 2018-07-09 NOTE — Patient Instructions (Addendum)
Attend   to the weight  To avoid further gaining  Will check uric acid .   then decide on dosing of allopurinol .  Also     b12 level today   And if ok can continue  As planned .   ROV in  6   months as   Preventive cpx

## 2018-07-10 ENCOUNTER — Encounter (HOSPITAL_COMMUNITY): Payer: PPO | Admitting: Cardiology

## 2018-07-31 ENCOUNTER — Other Ambulatory Visit: Payer: Self-pay | Admitting: Cardiology

## 2018-08-15 ENCOUNTER — Ambulatory Visit: Payer: PPO | Admitting: Adult Health

## 2018-08-15 ENCOUNTER — Ambulatory Visit: Payer: Self-pay

## 2018-08-15 ENCOUNTER — Encounter: Payer: Self-pay | Admitting: Adult Health

## 2018-08-15 ENCOUNTER — Ambulatory Visit: Payer: PPO | Admitting: Family Medicine

## 2018-08-15 ENCOUNTER — Ambulatory Visit (INDEPENDENT_AMBULATORY_CARE_PROVIDER_SITE_OTHER)
Admission: RE | Admit: 2018-08-15 | Discharge: 2018-08-15 | Disposition: A | Discharge status: Home / Self Care | Payer: PPO | Source: Ambulatory Visit | Attending: Adult Health | Admitting: Adult Health

## 2018-08-15 ENCOUNTER — Telehealth: Payer: Self-pay | Admitting: Adult Health

## 2018-08-15 VITALS — BP 104/64 | HR 68 | Ht 60.5 in | Wt 241.2 lb

## 2018-08-15 DIAGNOSIS — I270 Primary pulmonary hypertension: Secondary | ICD-10-CM | POA: Diagnosis not present

## 2018-08-15 DIAGNOSIS — R079 Chest pain, unspecified: Secondary | ICD-10-CM | POA: Diagnosis not present

## 2018-08-15 DIAGNOSIS — R0781 Pleurodynia: Secondary | ICD-10-CM | POA: Insufficient documentation

## 2018-08-15 DIAGNOSIS — G4733 Obstructive sleep apnea (adult) (pediatric): Secondary | ICD-10-CM

## 2018-08-15 DIAGNOSIS — I5032 Chronic diastolic (congestive) heart failure: Secondary | ICD-10-CM | POA: Diagnosis not present

## 2018-08-15 NOTE — Assessment & Plan Note (Addendum)
Appears compensated Continue current regimen

## 2018-08-15 NOTE — Telephone Encounter (Signed)
Pt c/o left chest pain and left upper back pain that started yesterday. Pt stated pain lasts a few seconds when she moves a certain way then goes away. Pt stated it feels like a cramp. Pt stated her pain hurts with coughing but does not hurt with a deep breath. Pt has a h/o CHF, pulmonary hypertension and had a CVA in 2012. Pt denies dizziness, nausea, vomiting, sweating, fever, difficulty breathing and cough. Pt given care instructions and pt verbalized understanding. Appointment given for today. Advised pt is chest pain worsens or becomes constant to go to the ED. Reason for Disposition . [1] Chest pain(s) lasting a few seconds AND [2] persists > 3 days  Answer Assessment - Initial Assessment Questions 1. LOCATION: "Where does it hurt?"       Left side under the left breast - pain to left side upper back  2. RADIATION: "Does the pain go anywhere else?" (e.g., into neck, jaw, arms, back)     back 3. ONSET: "When did the chest pain begin?" (Minutes, hours or days)      yesterday 4. PATTERN "Does the pain come and go, or has it been constant since it started?"  "Does it get worse with exertion?"      If still doesn't bother her only if moves a certain way it cramps 5. DURATION: "How long does it last" (e.g., seconds, minutes, hours)     Few seconds 6. SEVERITY: "How bad is the pain?"  (e.g., Scale 1-10; mild, moderate, or severe)    - MILD (1-3): doesn't interfere with normal activities     - MODERATE (4-7): interferes with normal activities or awakens from sleep    - SEVERE (8-10): excruciating pain, unable to do any normal activities       Hurts with cough but not with breathing 10 when it cramps and lasts a second 7. CARDIAC RISK FACTORS: "Do you have any history of heart problems or risk factors for heart disease?" (e.g., prior heart attack, angina; high blood pressure, diabetes, being overweight, high cholesterol, smoking, or strong family history of heart disease)     CHF, CVA in 2012-  father with 3 heart attacks and other s had strokes 8. PULMONARY RISK FACTORS: "Do you have any history of lung disease?"  (e.g., blood clots in lung, asthma, emphysema, birth control pills)     Pulmonary HTN 9. CAUSE: "What do you think is causing the chest pain?"     pleurisy 10. OTHER SYMPTOMS: "Do you have any other symptoms?" (e.g., dizziness, nausea, vomiting, sweating, fever, difficulty breathing, cough)       no 11. PREGNANCY: "Is there any chance you are pregnant?" "When was your last menstrual period?"       n/a  Protocols used: CHEST PAIN-A-AH

## 2018-08-15 NOTE — Assessment & Plan Note (Signed)
OSA and OHS continue on CPAP and oxygen at bedtime

## 2018-08-15 NOTE — Progress Notes (Signed)
Reviewed, agree 

## 2018-08-15 NOTE — Telephone Encounter (Signed)
Called and spoke with patient she is aware of results and verbalized understanding. Nothing further needed.

## 2018-08-15 NOTE — Patient Instructions (Addendum)
Alternate ice and heat to ribs .  Chest xray today .  Robaxin As needed  Muscle spasm  Continue on Oxygen 2l/m with activity .  Continue on CPAP At bedtime  With oxygen .  Follow up with Dr. [place] Bells as planned and As needed   Please contact office for sooner follow up if symptoms do not improve or worsen or seek emergency care
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
## 2018-08-15 NOTE — Assessment & Plan Note (Signed)
Compensated without flare

## 2018-08-15 NOTE — Assessment & Plan Note (Signed)
Right lateral rib pain suspect is musculoskeletal in nature.  Will check chest x-ray.  Patient is to use supportive care with ice and heat.  She is not a good candidate for NSAIDs due to her taking Xarelto  Plan  Patient Instructions  Alternate ice and heat to ribs .  Chest xray today .  Robaxin As needed  Muscle spasm  Continue on Oxygen 2l/m with activity .  Continue on CPAP At bedtime  With oxygen .  Follow up with Dr. Lake Bells as planned and As needed   Please contact office for sooner follow up if symptoms do not improve or worsen or seek emergency care

## 2018-08-22 ENCOUNTER — Other Ambulatory Visit: Payer: Self-pay | Admitting: Internal Medicine

## 2018-08-23 ENCOUNTER — Other Ambulatory Visit (HOSPITAL_COMMUNITY): Payer: Self-pay | Admitting: Cardiology

## 2018-08-23 NOTE — Telephone Encounter (Signed)
Refill once- in her absence.

## 2018-08-23 NOTE — Telephone Encounter (Signed)
Last seen 07/2018 Last refill 07/02/18 Pt due for CPE in May 2020  Please advise if able to refill in Dr Velora Mediate absence.

## 2018-08-27 NOTE — Telephone Encounter (Signed)
Okay to refill ?  Last OV: 07/09/18 Last filled :07/02/18 Last labs: 07/09/18  Please advise Thank you, Kerrin Champagne., RMA

## 2018-08-27 NOTE — Telephone Encounter (Signed)
Todd from [facility] calling to check status of this medication refill. Please advise.

## 2018-08-28 ENCOUNTER — Other Ambulatory Visit: Payer: Self-pay | Admitting: Internal Medicine

## 2018-08-28 NOTE — Telephone Encounter (Signed)
Copied from [place] [id number]. Topic: Quick Communication - Rx Refill/Question >> Aug 28, 2018  1:19 PM Windy Kalata wrote: Medication: traMADol (ULTRAM) 50 MG tablet   Has the patient contacted their pharmacy? Yes.   (Agent: If no, request that the patient contact the pharmacy for the refill.) (Agent: If yes, when and what did the pharmacy advise?)  Preferred Pharmacy (with phone number or street name): [id number] [place], [phone](Phone) [phone](Fax)    Agent: Please be advised that RX refills may take up to 3 business days. We ask that you follow-up with your pharmacy.

## 2018-08-28 NOTE — Telephone Encounter (Signed)
Sent in electronically .

## 2018-09-06 DIAGNOSIS — G4733 Obstructive sleep apnea (adult) (pediatric): Secondary | ICD-10-CM | POA: Diagnosis not present

## 2018-09-06 DIAGNOSIS — J9610 Chronic respiratory failure, unspecified whether with hypoxia or hypercapnia: Secondary | ICD-10-CM | POA: Diagnosis not present

## 2018-09-06 DIAGNOSIS — R269 Unspecified abnormalities of gait and mobility: Secondary | ICD-10-CM | POA: Diagnosis not present

## 2018-09-06 DIAGNOSIS — I5040 Unspecified combined systolic (congestive) and diastolic (congestive) heart failure: Secondary | ICD-10-CM | POA: Diagnosis not present

## 2018-09-14 ENCOUNTER — Telehealth (HOSPITAL_COMMUNITY): Payer: Self-pay

## 2018-09-14 NOTE — Telephone Encounter (Signed)
Received letter from patient pharmacy regarding the generic equivalent Alyq to Adcirca being available and its lower cost. Per patient, she would like to remain on Adcirca at least until she sees [name] in office on 09/19/2018. She will discuss at visit.

## 2018-09-19 ENCOUNTER — Encounter (HOSPITAL_COMMUNITY): Payer: Self-pay | Admitting: Cardiology

## 2018-09-19 ENCOUNTER — Ambulatory Visit (HOSPITAL_COMMUNITY)
Admission: RE | Admit: 2018-09-19 | Discharge: 2018-09-19 | Disposition: A | Discharge status: Home / Self Care | Payer: PPO | Source: Ambulatory Visit | Attending: Cardiology | Admitting: Cardiology

## 2018-09-19 VITALS — BP 140/72 | HR 72 | Wt 241.6 lb

## 2018-09-19 DIAGNOSIS — Z881 Allergy status to other antibiotic agents status: Secondary | ICD-10-CM | POA: Insufficient documentation

## 2018-09-19 DIAGNOSIS — G4733 Obstructive sleep apnea (adult) (pediatric): Secondary | ICD-10-CM | POA: Diagnosis not present

## 2018-09-19 DIAGNOSIS — Z79899 Other long term (current) drug therapy: Secondary | ICD-10-CM | POA: Diagnosis not present

## 2018-09-19 DIAGNOSIS — J9612 Chronic respiratory failure with hypercapnia: Secondary | ICD-10-CM | POA: Insufficient documentation

## 2018-09-19 DIAGNOSIS — J9611 Chronic respiratory failure with hypoxia: Secondary | ICD-10-CM | POA: Diagnosis not present

## 2018-09-19 DIAGNOSIS — Z9981 Dependence on supplemental oxygen: Secondary | ICD-10-CM | POA: Diagnosis not present

## 2018-09-19 DIAGNOSIS — I272 Pulmonary hypertension, unspecified: Secondary | ICD-10-CM

## 2018-09-19 DIAGNOSIS — E039 Hypothyroidism, unspecified: Secondary | ICD-10-CM | POA: Insufficient documentation

## 2018-09-19 DIAGNOSIS — Z8249 Family history of ischemic heart disease and other diseases of the circulatory system: Secondary | ICD-10-CM | POA: Insufficient documentation

## 2018-09-19 DIAGNOSIS — I110 Hypertensive heart disease with heart failure: Secondary | ICD-10-CM | POA: Insufficient documentation

## 2018-09-19 DIAGNOSIS — I2721 Secondary pulmonary arterial hypertension: Secondary | ICD-10-CM | POA: Insufficient documentation

## 2018-09-19 DIAGNOSIS — I482 Chronic atrial fibrillation, unspecified: Secondary | ICD-10-CM

## 2018-09-19 DIAGNOSIS — Z7901 Long term (current) use of anticoagulants: Secondary | ICD-10-CM | POA: Insufficient documentation

## 2018-09-19 DIAGNOSIS — I5032 Chronic diastolic (congestive) heart failure: Secondary | ICD-10-CM | POA: Diagnosis not present

## 2018-09-19 DIAGNOSIS — Z79890 Hormone replacement therapy (postmenopausal): Secondary | ICD-10-CM | POA: Insufficient documentation

## 2018-09-19 DIAGNOSIS — Z886 Allergy status to analgesic agent status: Secondary | ICD-10-CM | POA: Diagnosis not present

## 2018-09-19 DIAGNOSIS — Z888 Allergy status to other drugs, medicaments and biological substances status: Secondary | ICD-10-CM | POA: Diagnosis not present

## 2018-09-19 DIAGNOSIS — Z8673 Personal history of transient ischemic attack (TIA), and cerebral infarction without residual deficits: Secondary | ICD-10-CM | POA: Diagnosis not present

## 2018-09-19 LAB — BASIC METABOLIC PANEL
ANION GAP: 11 (ref 5–15)
BUN: 39 mg/dL — ABNORMAL HIGH (ref 8–23)
CALCIUM: 9.5 mg/dL (ref 8.9–10.3)
CO2: 22 mmol/L (ref 22–32)
Chloride: 108 mmol/L (ref 98–111)
Creatinine, Ser: 1.34 mg/dL — ABNORMAL HIGH (ref 0.44–1.00)
GFR calc Af Amer: 47 mL/min — ABNORMAL LOW (ref >60)
GFR, EST NON AFRICAN AMERICAN: 40 mL/min — AB (ref >60)
GLUCOSE: 97 mg/dL (ref 70–99)
POTASSIUM: 3.5 mmol/L (ref 3.5–5.1)
SODIUM: 141 mmol/L (ref 135–145)

## 2018-09-19 LAB — CBC
HCT: 40 % (ref 36.0–46.0)
Hemoglobin: 12.6 g/dL (ref 12.0–15.0)
MCH: 32.1 pg (ref 26.0–34.0)
MCHC: 31.5 g/dL (ref 30.0–36.0)
MCV: 101.8 fL — ABNORMAL HIGH (ref 80.0–100.0)
NRBC: 0 % (ref 0.0–0.2)
PLATELETS: 136 10*3/uL — AB (ref 150–400)
RBC: 3.93 MIL/uL (ref 3.87–5.11)
RDW: 14.1 % (ref 11.5–15.5)
WBC: 6.3 10*3/uL (ref 4.0–10.5)

## 2018-09-19 NOTE — Progress Notes (Signed)
Advanced Heart Failure Clinic Note   Primary Care: Dr. [name]  HF Cardiology: [name]  Pulm: Dr. Lake Bells  HPI:  [name] is a 68 y.o. female with PMH of pulmonary HTN, morbid obesity, chronic afib (on Xarelto, CVA in 2012, OHS/OSA (Had U PE3 surgery, and chronic respiratory failure with hypoxemia on continuous 02 at 2 lpm via Ray).  Admitted 3/2 ->7/32/20 with A/C diastolic CHF and A/C respiratory failure. Pt initially refused Bipap so venti mask used. Eventually tolerated transition to BiPAP. Overall pt diuresed from 285 lbs down to 229 lbs with 38 L of diuresis.  Echo 11/07/16 EF 55-60%, PASP 80m Hg, mildly dilated RV with moderate to severely decreased RV systolic function. RHC/LHC in 3/18 showed no coronary disease and confirmed severe PH.    She returns today for followup of diastolic CHF. Weight is up about 17 lbs since last appointment.  She has had trouble with dieting.  However, she is starting to work out.  [facility] and using Nu-step and treadmill.  No dyspnea walking on flat ground.  She has dyspnea walking up stairs/inclines. Limited more by knee OA than dyspnea.  No orthopnea/PND. BP high today but checks at home frequently, SBP tends to run 110s at home. No BRBPR/melena.    ECG (personally reviewed): atrial fibrillation, poor RWP   Labs (1/19): K 3.7, creatinine 1.37 Labs (11/19): K 3.9, creatinine 1.2, LDL 67, hgb 13.2  PMH 1. 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 - RHC/LHC 11/07/16: No angiographic CAD; PA 90/55, LVEDP 23 (PCWP inaccurate), CI 2.15 Fick/2.32 Thermo, shunt run negative, PVR 5.7 WU.  2. Chronic hypercarbic/hypoxic respiratory failure with OHS/OSA. Sees Dr. McLake BellsUsing nightly BiPAP and oxygen by nasal cannula during the day.  3. Atrial fibrillation:  Chronic - Rate controlled on Xarelto + atenolol  4. 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 PVR 5.7 WU on RHC 3/18. Suspect group 2 (elevated LA pressure) and group 3 (OHS/OSA) PH. However, cannot rule out group 1 component. She had V/Q scan that was not suggestive of chronic PE and high resolution chest CT that was not suggestive of ILD. ANA, RF, anti-SCL70 all negative.   5. H/o CVA 6. Hypothyroidism 7. H/o TKR   Current Outpatient Medications  Medication Sig Dispense Refill  . acetaZOLAMIDE (DIAMOX) 250 MG tablet TAKE 1 TABLET BY MOUTH EVERY DAY 90 tablet 1  . ADCIRCA 20 MG tablet TAKE 2 TABLETS BY MOUTH ONCE DAILY..CMarland KitchenLL [phone] REFILLS. 180 tablet 0  . allopurinol (ZYLOPRIM) 100 MG tablet TAKE 1 TABLET BY MOUTH TWICE A DAY 180 tablet 0  . atenolol (TENORMIN) 50 MG tablet Take 1 tablet (50 mg total) by mouth every morning AND 0.5 tablets (25 mg total) at bedtime. NEEDS APPOINTMENT WITH DR BEGwenlyn FoundOR REFILLS. 135 tablet 0  . diclofenac sodium (VOLTAREN) 1 % GEL Apply 2 g topically 2 (two) times daily as needed (for arthritis).     . methocarbamol (ROBAXIN) 500 MG tablet TAKE 1/2 TABLET BY MOUTH AS NEEDED FOR MUSCLE SPASMS. 30 tablet 0  . potassium chloride (K-DUR) 10 MEQ tablet Take 2 tablets (20 mEq total) by mouth 2 (two) times daily. 120 tablet 5  . rosuvastatin (CRESTOR) 5 MG tablet Take 1 tablet (5 mg total) by mouth daily. 30 tablet 11  .  torsemide (DEMADEX) 20 MG tablet TAKE 2 TABLETS (40 MG TOTAL) BY MOUTH 2 (TWO) TIMES DAILY. (Patient taking differently: 40 MG  in the AM and 20 mg in the afternoon) 120 tablet 6  . XARELTO 20 MG TABS tablet TAKE 1 TABLET BY MOUTH EVERY DAY WITH SUPPER 90 tablet 0  . colchicine 0.6 MG tablet Take 1 tablet (0.6 mg total) by mouth daily as needed (flareups). (Patient not taking: Reported on 09/19/2018) 60 tablet 1  . levothyroxine (SYNTHROID, LEVOTHROID) 75 MCG tablet TAKE 1 TABLET BY MOUTH EVERY DAY  90 tablet 1  . traMADol (ULTRAM) 50 MG tablet TAKE 1 TABLET BY MOUTH THREE TIMES A DAY AS NEEDED (Patient not taking: Reported on 09/19/2018) 60 tablet 1   No current facility-administered medications for this encounter.     Allergies  Allergen Reactions  . Ambien [Zolpidem Tartrate] Other (See Comments)    Amnesia and fall   . Losartan Other (See Comments)    Elevated creatinine and swelling  . Prevacid [Lansoprazole] Swelling  . Adhesive [Tape] Other (See Comments)    Adhesive tape and band aids " irritate, " causes blisters and pulls skin when removing. Okay to use paper tape  . Keflex [Cephalexin] Swelling    Swelling in ankles, feet  . Motrin [Ibuprofen] Other (See Comments)    ANKLE EDEMA   . Prilosec [Omeprazole] Other (See Comments)    Swelling in ankles.  . Ace Inhibitors Cough  . Benadryl [Diphenhydramine Hcl] Other (See Comments)    topical  . Spironolactone Rash      Social History   Socioeconomic History  . Marital status: Divorced    Spouse name: Not on file  . Number of children: Not on file  . Years of education: Not on file  . Highest education level: Not on file  Occupational History  . Not on file  Social Needs  . Financial resource strain: Not on file  . Food insecurity:    Worry: Not on file    Inability: Not on file  . Transportation needs:    Medical: Not on file    Non-medical: Not on file  Tobacco Use  . Smoking status: Never Smoker  . Smokeless tobacco: Never Used  Substance and Sexual Activity  . Alcohol use: Yes    Alcohol/week: 1.0 standard drinks    Types: 1 Glasses of wine per week    Comment: occ  . Drug use: No  . Sexual activity: Never    Partners: Male    Birth control/protection: Post-menopausal  Lifestyle  . Physical activity:    Days per week: Not on file    Minutes per session: Not on file  . Stress: Not on file  Relationships  . Social connections:    Talks on phone: Not on file    Gets together: Not on file     Attends religious service: Not on file    Active member of club or organization: Not on file    Attends meetings of clubs or organizations: Not on file    Relationship status: Not on file  . Intimate partner violence:    Fear of current or ex partner: Not on file    Emotionally abused: Not on file    Physically abused: Not on file    Forced sexual activity: Not on file  Other Topics Concern  . 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 COPD Mother   . Hypertension Mother   . Osteoporosis Mother   . Diabetes Father   . Hypertension Father   . Liver cancer Father   . Heart attack Father   . Hypertension Sister   . Hypertension Brother   . Stroke Maternal Grandmother     Vitals:   09/19/18 0910 09/19/18 0914  BP: (!) 160/82 140/72  Pulse: 72 72  SpO2: 95% 98%  Weight: 109.6 kg (241 lb 9.6 oz)    Wt Readings from Last 3 Encounters:  09/19/18 109.6 kg (241 lb 9.6 oz)  08/15/18 109.4 kg (241 lb 3.2 oz)  07/09/18 106.8 kg (235 lb 8 oz)   PHYSICAL EXAM: General: NAD, obese Neck: JVP 7-8 cm, no thyromegaly or thyroid nodule.  Lungs: Clear to auscultation bilaterally with normal respiratory effort. CV: Nondisplaced PMI.  Heart irregular S1/S2, no S3/S4, no murmur.  No peripheral edema.  No carotid bruit.  Normal pedal pulses.  Abdomen: Soft, nontender, no hepatosplenomegaly, no distention.  Skin: Intact without lesions or rashes.  Neurologic: Alert and oriented x 3.  Psych: Normal affect. Extremities: No clubbing or cyanosis.  HEENT: Normal.    ASSESSMENT & PLAN: 1. Chronic diastolic CHF:  Associated with severe pulmonary hypertension and prominent RV failure.  NYHA class II symptoms.  More limited by orthopedic issues than CHF.  She does not appear volume overloaded on exam.   - Continue torsemide 79m in the am  and 20 mg in the pm.  - Continue diamox 250 mg daily.  - BMET today.   - I will arrange for repeat echo to follow RV.  2. Chronic hypercarbic/hypoxic respiratory failure with OHS/OSA: Sees Dr. MLake Bells   - Continue home oxygen.  - Compliant with Bipap nightly.  - Continue regular exercise and efforts at weight loss.   3. Atrial fibrillation: Chronic, rate controlled  - Continue atenolol 50 qam/25 qpm.   - Continue Xarelto for anticoagulation.  Check CBC today.  4. 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 PVR 5.7 WU by RCustarin 3/18. Suspect group 2 (elevated LA pressure) and group 3 (OHS/OSA) PH. However, cannot rule out group 1 component. She had V/Q scan that was not suggestive of chronic PE and high resolution chest CT that was not suggestive of ILD. ANA, RF, anti-SCL70 all negative.  She did not have a marked improvement from taking Adcirca => suspect predominantly group 2 and 3 PH.  Will hold off on additional pulmonary vasodilators.  - Continue Adcirca 40 mg daily.  - Continue 02 at 2 L and BiPAP at night for OHS/OSA.  - She wants to hold off on 6 minute walk today, would like to do this with her walker.  Will bring next appointment.  5. HTN: BP not normally elevated. Check BP daily for 2 wks and call in readings.   Followup in 4 months.    DLoralie Champagne1/15/2020

## 2018-09-19 NOTE — Patient Instructions (Signed)
Labs were done today. We will call you with any ABNORMAL results.  EKG was done today.  Your physician has requested that you have an echocardiogram. Echocardiography is a painless test that uses sound waves to create images of your heart. It provides your doctor with information about the size and shape of your heart and how well your heart's chambers and valves are working. This procedure takes approximately one hour. There are no restrictions for this procedure.  Your physician wants you to follow-up in 4 months You will receive a reminder letter in the mail two months in advance. If you don't receive a letter, please call our office to schedule the follow-up appointment.

## 2018-09-27 ENCOUNTER — Other Ambulatory Visit: Payer: Self-pay | Admitting: Internal Medicine

## 2018-10-01 NOTE — Telephone Encounter (Signed)
  [id number] [phone](Phone) [phone](Fax)    calling about RX   allopurinol (ZYLOPRIM) 100 MG tablet

## 2018-10-02 ENCOUNTER — Other Ambulatory Visit (HOSPITAL_COMMUNITY): Payer: Self-pay | Admitting: Internal Medicine

## 2018-10-04 ENCOUNTER — Other Ambulatory Visit (HOSPITAL_COMMUNITY): Payer: Self-pay

## 2018-10-10 ENCOUNTER — Other Ambulatory Visit (HOSPITAL_COMMUNITY): Payer: Self-pay

## 2018-10-11 ENCOUNTER — Other Ambulatory Visit (HOSPITAL_COMMUNITY): Payer: Self-pay

## 2018-10-11 MED ORDER — TADALAFIL (PAH) 20 MG PO TABS: 40.0000 mg | ORAL_TABLET | Freq: Every day | ORAL | 3 refills | Status: DC

## 2018-10-11 NOTE — Telephone Encounter (Signed)
Adcirca 28m replaced with Alyq 245mper pharm request due to lower cost. Verbal confirmation with Dr McAundra Dubinas agreeable

## 2018-10-26 ENCOUNTER — Other Ambulatory Visit: Payer: Self-pay | Admitting: Cardiology

## 2018-11-01 ENCOUNTER — Ambulatory Visit: Payer: PPO | Admitting: Pulmonary Disease

## 2018-12-17 DIAGNOSIS — G4733 Obstructive sleep apnea (adult) (pediatric): Secondary | ICD-10-CM | POA: Diagnosis not present

## 2018-12-17 DIAGNOSIS — J9610 Chronic respiratory failure, unspecified whether with hypoxia or hypercapnia: Secondary | ICD-10-CM | POA: Diagnosis not present

## 2018-12-20 ENCOUNTER — Ambulatory Visit: Payer: PPO | Admitting: Pulmonary Disease

## 2018-12-21 ENCOUNTER — Ambulatory Visit (INDEPENDENT_AMBULATORY_CARE_PROVIDER_SITE_OTHER): Payer: PPO | Admitting: Internal Medicine

## 2018-12-21 ENCOUNTER — Encounter: Payer: Self-pay | Admitting: Internal Medicine

## 2018-12-21 ENCOUNTER — Other Ambulatory Visit: Payer: Self-pay

## 2018-12-21 DIAGNOSIS — E039 Hypothyroidism, unspecified: Secondary | ICD-10-CM | POA: Diagnosis not present

## 2018-12-21 DIAGNOSIS — E790 Hyperuricemia without signs of inflammatory arthritis and tophaceous disease: Secondary | ICD-10-CM

## 2018-12-21 DIAGNOSIS — M47819 Spondylosis without myelopathy or radiculopathy, site unspecified: Secondary | ICD-10-CM

## 2018-12-21 DIAGNOSIS — E662 Morbid (severe) obesity with alveolar hypoventilation: Secondary | ICD-10-CM | POA: Diagnosis not present

## 2018-12-21 DIAGNOSIS — Z79899 Other long term (current) drug therapy: Secondary | ICD-10-CM

## 2018-12-21 DIAGNOSIS — R7303 Prediabetes: Secondary | ICD-10-CM | POA: Diagnosis not present

## 2018-12-21 DIAGNOSIS — I10 Essential (primary) hypertension: Secondary | ICD-10-CM

## 2018-12-21 DIAGNOSIS — Z1211 Encounter for screening for malignant neoplasm of colon: Secondary | ICD-10-CM | POA: Diagnosis not present

## 2018-12-21 DIAGNOSIS — M5136 Other intervertebral disc degeneration, lumbar region: Secondary | ICD-10-CM | POA: Diagnosis not present

## 2018-12-21 MED ORDER — TRAMADOL HCL 50 MG PO TABS: 50.0000 mg | ORAL_TABLET | Freq: Three times a day (TID) | ORAL | 1 refills | Status: DC | PRN
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
## 2018-12-24 ENCOUNTER — Other Ambulatory Visit: Payer: Self-pay | Admitting: Internal Medicine

## 2018-12-26 ENCOUNTER — Other Ambulatory Visit (HOSPITAL_COMMUNITY): Payer: Self-pay | Admitting: Cardiology

## 2018-12-27 ENCOUNTER — Other Ambulatory Visit: Payer: Self-pay | Admitting: Internal Medicine

## 2019-01-02 ENCOUNTER — Other Ambulatory Visit (HOSPITAL_COMMUNITY): Payer: Self-pay | Admitting: *Deleted

## 2019-01-02 MED ORDER — POTASSIUM CHLORIDE ER 10 MEQ PO TBCR: 20.0000 meq | EXTENDED_RELEASE_TABLET | Freq: Two times a day (BID) | ORAL | 3 refills | Status: DC

## 2019-01-02 MED ORDER — TADALAFIL (PAH) 20 MG PO TABS: 40.0000 mg | ORAL_TABLET | Freq: Every day | ORAL | 3 refills | Status: DC

## 2019-01-07 ENCOUNTER — Ambulatory Visit: Payer: PPO | Admitting: Internal Medicine

## 2019-01-14 ENCOUNTER — Encounter: Payer: Self-pay | Admitting: Pulmonary Disease

## 2019-01-15 ENCOUNTER — Other Ambulatory Visit: Payer: Self-pay

## 2019-01-15 ENCOUNTER — Ambulatory Visit (INDEPENDENT_AMBULATORY_CARE_PROVIDER_SITE_OTHER): Payer: PPO | Admitting: Adult Health

## 2019-01-15 ENCOUNTER — Encounter: Payer: Self-pay | Admitting: Adult Health

## 2019-01-15 DIAGNOSIS — G4733 Obstructive sleep apnea (adult) (pediatric): Secondary | ICD-10-CM | POA: Diagnosis not present

## 2019-01-15 DIAGNOSIS — I272 Pulmonary hypertension, unspecified: Secondary | ICD-10-CM | POA: Diagnosis not present

## 2019-01-15 DIAGNOSIS — I5032 Chronic diastolic (congestive) heart failure: Secondary | ICD-10-CM

## 2019-01-15 DIAGNOSIS — E662 Morbid (severe) obesity with alveolar hypoventilation: Secondary | ICD-10-CM

## 2019-01-15 NOTE — Progress Notes (Signed)
Virtual Visit via Telephone Note  I connected with [name] on 01/15/19 at  9:00 AM EDT by telephone and verified that I am speaking with the correct person using two identifiers.  Location: Patient: Home Provider: Office   I discussed the limitations, risks, security and privacy concerns of performing an evaluation and management service by telephone and the availability of in person appointments. I also discussed with the patient that there may be a patient responsible charge related to this service. The patient expressed understanding and agreed to proceed.   History of Present Illness: 68 year old female former smoker followed for chronic hypoxic respiratory failure, obesity hypoventilation syndrome.  She has pulmonary hypertension secondary to OHS and significant diastolic heart failure.  She is on BiPAP at bedtime .  She uses oxygen 2 L with activity as needed. Medical history significant for CVA in 2012, chronic A. fib on Xarelto Followed by cardiology/CHF clinic.  On Adcirca for her pulmonary hypertension  Today's televisit is for 6 month follow up for OSA/OHS , O2 RF , D CHF , Pulmonary HTN .  Patient says since last visit she is doing very well.  She was remaining active at home.  She says she was exercising at the gym until COVID-19 restrictions began 2 months ago.  She is trying to walk in her home.  She is good to start walking outside since the weather is improving.  She does use a walker to get around.  Balance is still an issue intermittently. Uses oxygen with activity . Most of the time does not need oxygen, O2 sats walking 93-94% on room air  No Oxygen At bedtime .    She remains on CPAP at bedtime.  She is no longer on oxygen with her CPAP.  Says she wears her CPAP each night.  Feels rested with no significant daytime sleepiness CPAP download was requested  Patient has chronic diastolic heart failure and pulmonary hypertension.  She remains on torsemide 40 mg in the  morning and 20 mg in the evening.  She is on Adcirca 40 mg daily.  She does have chronic A. fib and is on Xarelto.  Patient says that she is tolerating medications without known difficulties.  She has upcoming labs that are pending.  She says that she has no dyspnea at rest.  She says she does get winded with heavy activities like carrying groceries but feels that is more of fatigue and shortness of breath.  Says she recovers pretty easily.  Says her breathing is been doing the best is done in a while.  She denies any leg swelling.  Has noticed that her weight is up some but feels that that is from her decreased activity level.  She says she is trying to eat healthy.    Observations/Objective: severe obstructive sleep apnea in 2012 with AHI 114  ABG  10/06/2016 performed on oxygen: 7.43/53.1/65.0/34.7 11/13/2016 7.43/82/88/96% on 4L   RHC 11/07/16 TPG 50-91m Hg PAP mean: 55 mmHg PCWP: 63 mmHg (not accurate). LVEDP: 28. PVR using LVEDP and mean PA 5.7 WU.  Cardiac output/index by Fick: 4.74/2.15. RV pressures/EDP: 97/13/21 mmHg Shunt showed no left to right shunt.   Cardiac imaging: March 2018 echocardiogram LVEF 55-60%, ventricular septum consistent with RV overload, RV dilated systolic function moderate to severely reduced, PA pressure estimate 90 mmHg  Imaging: February first 2018 VQ scan no evidence of blood clot  Imaging: 10/06/2016 high-resolution CT scan of the chest: Images independently reviewed by me today  in clinic, there is normal pulmonary parenchyma with the exception of interlobular septal thickening and groundglass worse in the dependent sections of all lobes, there is a moderate size right-sided pleural effusion, pulmonary vascular engorgement.   2018 -changed from BIPAP to CPAP .   Assessment and Plan: OHS/OSA-stable on CPAP.  CPAP download was requested  Pulmonary hypertension-dyspnea level-and activity tolerance seems to be at baseline.  Continue on  diuretics and oxygen to keep O2 saturations greater than 90%.  Continue on CPAP to control underlying sleep apnea. Continue and Adcirca and follow-up with cardiology.  Chronic diastolic heart failure per report patient seems to be doing well with no increased leg swelling.  Patient is encouraged on a healthy low-salt diet.  Continue follow-up with cardiology and continue on diuretics  Plan  Patient Instructions  Continue on Oxygen 2l/m with activity As needed  , goal is for Oxygen level >90%  Continue on CPAP  At bedtime  .  CPAP download requested.  Work on healthy weight  Do not drive if sleepy.  Continue with follow up with Cardiology as planned.  Continue on Torsemide .  Low salt diet.  Continue on Adcirca  Follow up with Dr. Lake Bells in 4 months and As needed   Please contact office for sooner follow up if symptoms do not improve or worsen or seek emergency care       Follow Up Instructions: Follow-up with Dr. Lake Bells or Mckinnley Cottier in 4 months and As needed   Please contact office for sooner follow up if symptoms do not improve or worsen or seek emergency care      I discussed the assessment and treatment plan with the patient. The patient was provided an opportunity to ask questions and all were answered. The patient agreed with the plan and demonstrated an understanding of the instructions.   The patient was advised to call back or seek an in-person evaluation if the symptoms worsen or if the condition fails to improve as anticipated.  I provided 33 minutes of non-face-to-face time during this encounter.   [name], NP

## 2019-01-15 NOTE — Patient Instructions (Addendum)
Continue on Oxygen 2l/m with activity As needed  , goal is for Oxygen level >90%  Continue on CPAP  At bedtime  .  CPAP download requested.  Work on healthy weight  Do not drive if sleepy.  Continue with follow up with Cardiology as planned.  Continue on Torsemide .  Low salt diet.  Continue on Adcirca  Follow up with Dr. Lake Bells in 4 months and As needed   Please contact office for sooner follow up if symptoms do not improve or worsen or seek emergency care

## 2019-01-16 NOTE — Progress Notes (Signed)
Reviewed, agree 

## 2019-01-18 ENCOUNTER — Other Ambulatory Visit: Payer: Self-pay

## 2019-01-18 ENCOUNTER — Ambulatory Visit (HOSPITAL_COMMUNITY)
Admission: RE | Admit: 2019-01-18 | Discharge: 2019-01-18 | Disposition: A | Discharge status: Home / Self Care | Payer: PPO | Source: Ambulatory Visit | Attending: Cardiology | Admitting: Cardiology

## 2019-01-18 ENCOUNTER — Telehealth (HOSPITAL_COMMUNITY): Payer: PPO | Admitting: Cardiology

## 2019-01-18 ENCOUNTER — Other Ambulatory Visit (HOSPITAL_COMMUNITY): Payer: PPO

## 2019-01-18 ENCOUNTER — Encounter (HOSPITAL_COMMUNITY): Payer: Self-pay

## 2019-01-18 DIAGNOSIS — I272 Pulmonary hypertension, unspecified: Secondary | ICD-10-CM

## 2019-01-18 DIAGNOSIS — I5032 Chronic diastolic (congestive) heart failure: Secondary | ICD-10-CM

## 2019-01-18 NOTE — Progress Notes (Signed)
Called pt to schedule f/u. No answer. Left voicemail for a call back and sent orders to scheduler. Lab orders sent to pt's pcp.

## 2019-01-18 NOTE — Patient Instructions (Signed)
Lab work will need to be done. A prescription has been sent to your PCP. Please call them to set up an appointment with them in order to have them drawn.  Your physician has requested that you have an echocardiogram. Echocardiography is a painless test that uses sound waves to create images of your heart. It provides your doctor with information about the size and shape of your heart and how well your heart's chambers and valves are working. This procedure takes approximately one hour. There are no restrictions for this procedure. Someone will call you to schedule this.  Please follow up with [name] in 4 months

## 2019-01-20 NOTE — Progress Notes (Signed)
Heart Failure TeleHealth Note  Due to national recommendations of social distancing due to [place] 19, Audio/video telehealth visit is felt to be most appropriate for this patient at this time.  See MyChart message from today for patient consent regarding telehealth for [facility].  Date:  01/20/2019   ID:  [name], DOB 08/05/1951, MRN [id number]  Location: Home  Provider location: [place] [facility] Type of Visit: Established patient   PCP:  [name], MD  Cardiologist:  [name]  Chief Complaint:  Shortness of breath   History of Present Illness: [name] is a 68 y.o. female who presents via audio/video conferencing for a telehealth visit today.     she denies symptoms worrisome for COVID 98.   68 y.o. 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  Admitted 3/2 ->2/99/24 with A/C diastolic CHF and A/C respiratory failure. Pt initially refused Bipap so venti mask used. Eventually tolerated transition to BiPAP. Overall pt diuresed from 285 lbs down to 229 lbs with 38 L of diuresis.  Echo 11/07/16 EF 55-60%, PASP 69m Hg, mildly dilated RV with moderate to severely decreased RV systolic function. RHC/LHC in 3/18 showed no coronary disease and confirmed severe PH.    Weight has been stable recently.  No edema.  She is using CPAP at night but is now off oxygen during the day.  No dyspnea walking around her house.  Not very active, she does get winded carrying in groceries.  No chest pain.  No palpitations.  No lightheadedness.    ECG (personally reviewed): atrial fibrillation, poor RWP   Labs (1/19): K 3.7, creatinine 1.37 Labs (11/19): K 3.9, creatinine 1.2, LDL 67, hgb 13.2 Labs (1/20): K 3.5, creatinine 1.34  PMH 1. Chronic diastolic CHF with severe pulmonary hypertension and prominent RV  failure:  Echo (3/18) with EF 55-60%, PASP 91mHg, mildly dilated RV with moderate to severely decreased RV systolic function. - RHC/LHC 11/07/16: No angiographic CAD; PA 90/55, LVEDP 23 (PCWP inaccurate), CI 2.15 Fick/2.32 Thermo, shunt run negative, PVR 5.7 WU.  2. Chronic hypercarbic/hypoxic respiratory failure with OHS/OSA. Sees Dr. McLake BellsUsing nightly BiPAP and oxygen by nasal cannula during the day.  3. Atrial fibrillation: Chronic - Rate controlled on Xarelto + atenolol  4. 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 PVR 5.7 WU on RHC 3/18. Suspect group 2 (elevated LA pressure) and group 3 (OHS/OSA) PH. However, cannot rule out group 1 component. She had V/Q scan that was not suggestive of chronic PE and high resolution chest CT that was not suggestive of ILD. ANA, RF, anti-SCL70 all negative.   5. H/o CVA 6. Hypothyroidism 7. H/o TKR  Current Outpatient Medications  Medication Sig Dispense Refill  . atenolol (TENORMIN) 50 MG tablet Take 50 mg by mouth daily.    . Marland KitchencetaZOLAMIDE (DIAMOX) 250 MG tablet TAKE 1 TABLET BY MOUTH EVERY DAY 90 tablet 1  . allopurinol (ZYLOPRIM) 100 MG tablet TAKE 1 TABLET BY MOUTH TWICE A DAY 180 tablet 0  . colchicine 0.6 MG tablet Take 1 tablet (0.6 mg total) by mouth daily as needed (flareups). 60 tablet 1  . diclofenac sodium (VOLTAREN) 1 % GEL Apply 2 g topically 2 (two) times daily as needed (for arthritis).     . Marland Kitchenevothyroxine (SYNTHROID) 75  MCG tablet TAKE 1 TABLET BY MOUTH EVERY DAY 90 tablet 1  . methocarbamol (ROBAXIN) 500 MG tablet TAKE 1/2 TABLET BY MOUTH AS NEEDED FOR MUSCLE SPASMS. 30 tablet 0  . potassium chloride (K-DUR) 10 MEQ tablet Take 2 tablets (20 mEq total) by mouth 2 (two) times daily. 360 tablet 3  . rosuvastatin (CRESTOR) 5 MG tablet Take 1 tablet (5 mg total) by mouth daily. 30 tablet 11  . tadalafil, PAH, (ALYQ) 20 MG tablet Take 2 tablets (40 mg total) by mouth daily. 180 tablet 3  .  torsemide (DEMADEX) 20 MG tablet TAKE 2 TABLETS (40 MG TOTAL) BY MOUTH 2 (TWO) TIMES DAILY. (Patient taking differently: 40 MG  in the AM and 20 mg in the afternoon) 120 tablet 6  . traMADol (ULTRAM) 50 MG tablet Take 1 tablet (50 mg total) by mouth 3 (three) times daily as needed. 60 tablet 1  . XARELTO 20 MG TABS tablet TAKE 1 TABLET BY MOUTH EVERY DAY WITH SUPPER 90 tablet 0   No current facility-administered medications for this encounter.     Allergies:   Ambien [zolpidem tartrate]; Losartan; Prevacid [lansoprazole]; Adhesive [tape]; Keflex [cephalexin]; Motrin [ibuprofen]; Prilosec [omeprazole]; Ace inhibitors; Benadryl [diphenhydramine hcl]; and Spironolactone   Social History:  The patient  reports that she has never smoked. She has never used smokeless tobacco. She reports current alcohol use of about 1.0 standard drinks of alcohol per week. She reports that she does not use drugs.   Family History:  The patient's family history includes COPD in her mother; Diabetes in her father; Heart attack in her father; Hypertension in her brother, father, mother, and sister; Liver cancer in her father; Osteoporosis in her mother; Stroke in her maternal grandmother.   ROS:  Please see the history of present illness.   All other systems are personally reviewed and negative.   Exam:  (Video/Tele Health Call; Exam is subjective and or/visual.) BP 120/71, HR 68, oxygen saturation 97% General:  Speaks in full sentences. No resp difficulty. Neck: Thick, JVP difficult but does not appear elevated.  Lungs: Normal respiratory effort with conversation.  Abdomen: Non-distended per patient report Extremities: Pt denies edema. Neuro: Alert & oriented x 3.   Recent Labs: 07/09/2018: ALT 12; TSH 1.64 09/19/2018: BUN 39; Creatinine, Ser 1.34; Hemoglobin 12.6; Platelets 136; Potassium 3.5; Sodium 141  Personally reviewed   Wt Readings from Last 3 Encounters:  09/19/18 109.6 kg (241 lb 9.6 oz)  08/15/18  109.4 kg (241 lb 3.2 oz)  07/09/18 106.8 kg (235 lb 8 oz)      ASSESSMENT AND PLAN:  1. Chronic diastolic CHF:  Associated with severe pulmonary hypertension and prominent RV failure.  NYHA class II symptoms.  More limited by orthopedic issues than CHF.  She does not appear volume overloaded on video exam and weight is stable.   - Continue torsemide 58m in the am and 20 mg in the pm.  - Continue diamox 250 mg daily.  - I will arrange for a BMET.  - She needs a repeat echo to follow RV.  2. Chronic hypercarbic/hypoxic respiratory failure with OHS/OSA: Sees Dr. MLake Bells  Using bipap at night but has been able to stop oxygen during the day.   - Compliant with Bipap nightly.  - Continue regular exercise and efforts at weight loss.   3. Atrial fibrillation: Chronic, rate controlled  - Continue atenolol 50 mg daily.    - Continue Xarelto for anticoagulation.   4. 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 PVR 5.7 WU by [place] in 3/18. Suspect group 2 (elevated LA pressure) and group 3 (OHS/OSA) PH. However, cannot rule out group 1 component. She had V/Q scan that was not suggestive of chronic PE and high resolution chest CT that was not suggestive of ILD. ANA, RF, anti-SCL70 all negative.  She did not have a marked improvement from taking Adcirca => suspect predominantly group 2 and 3 PH.  Will hold off on additional pulmonary vasodilators.  - Continue Adcirca 40 mg daily.  - Continue BiPAP at night for OSA. She no longer requires oxygen during the day. - Needs echo scheduled to follow RV. - 6 minute walk next appt.  5. HTN: BP controlled.   COVID screen The patient does not have any symptoms that suggest any further testing/ screening at this time.  Social distancing reinforced today.  Patient Risk: After full review of this patients clinical status, I feel that they are at moderate risk for cardiac decompensation at this time.  Relevant cardiac  medications were reviewed at length with the patient today. The patient does not have concerns regarding their medications at this time.   Recommended follow-up:  4 months  Today, I have spent 17 minutes with the patient with telehealth technology discussing the above issues .    Signed, Loralie Champagne, MD  01/20/2019 10:05 PM  [place] [address] [facility] [place] [address] [phone] (office) [phone] (fax)

## 2019-01-23 ENCOUNTER — Other Ambulatory Visit: Payer: Self-pay | Admitting: Cardiology

## 2019-01-30 ENCOUNTER — Ambulatory Visit: Payer: PPO

## 2019-01-30 ENCOUNTER — Other Ambulatory Visit (INDEPENDENT_AMBULATORY_CARE_PROVIDER_SITE_OTHER): Payer: PPO

## 2019-01-30 ENCOUNTER — Other Ambulatory Visit: Payer: Self-pay

## 2019-01-30 DIAGNOSIS — M5136 Other intervertebral disc degeneration, lumbar region: Secondary | ICD-10-CM | POA: Diagnosis not present

## 2019-01-30 DIAGNOSIS — M47819 Spondylosis without myelopathy or radiculopathy, site unspecified: Secondary | ICD-10-CM

## 2019-01-30 DIAGNOSIS — I10 Essential (primary) hypertension: Secondary | ICD-10-CM | POA: Diagnosis not present

## 2019-01-30 DIAGNOSIS — E790 Hyperuricemia without signs of inflammatory arthritis and tophaceous disease: Secondary | ICD-10-CM | POA: Diagnosis not present

## 2019-01-30 DIAGNOSIS — R7303 Prediabetes: Secondary | ICD-10-CM | POA: Diagnosis not present

## 2019-01-30 DIAGNOSIS — E039 Hypothyroidism, unspecified: Secondary | ICD-10-CM | POA: Diagnosis not present

## 2019-01-30 DIAGNOSIS — Z79899 Other long term (current) drug therapy: Secondary | ICD-10-CM | POA: Diagnosis not present

## 2019-01-30 LAB — BASIC METABOLIC PANEL
BUN: 25 mg/dL — ABNORMAL HIGH (ref 6–23)
CO2: 26 mEq/L (ref 19–32)
Calcium: 9.1 mg/dL (ref 8.4–10.5)
Chloride: 104 mEq/L (ref 96–112)
Creatinine, Ser: 1.18 mg/dL (ref 0.40–1.20)
GFR: 45.36 mL/min — ABNORMAL LOW (ref >60.00)
Glucose, Bld: 77 mg/dL (ref 70–99)
Potassium: 3.9 mEq/L (ref 3.5–5.1)
Sodium: 140 mEq/L (ref 135–145)

## 2019-01-30 LAB — HEPATIC FUNCTION PANEL
ALT: 7 U/L (ref 0–35)
AST: 17 U/L (ref 0–37)
Albumin: 3.9 g/dL (ref 3.5–5.2)
Alkaline Phosphatase: 122 U/L — ABNORMAL HIGH (ref 39–117)
Bilirubin, Direct: 0.2 mg/dL (ref 0.0–0.3)
Total Bilirubin: 0.7 mg/dL (ref 0.2–1.2)
Total Protein: 6 g/dL (ref 6.0–8.3)

## 2019-01-30 LAB — LIPID PANEL
Cholesterol: 154 mg/dL (ref 0–200)
HDL: 55.7 mg/dL (ref >39.00)
LDL Cholesterol: 73 mg/dL (ref 0–99)
NonHDL: 97.89
Total CHOL/HDL Ratio: 3
Triglycerides: 124 mg/dL (ref 0.0–149.0)
VLDL: 24.8 mg/dL (ref 0.0–40.0)

## 2019-01-30 LAB — HEMOGLOBIN A1C: Hgb A1c MFr Bld: 5.8 % (ref 4.6–6.5)

## 2019-01-30 LAB — URIC ACID: Uric Acid, Serum: 4.1 mg/dL (ref 2.4–7.0)

## 2019-01-30 LAB — TSH: TSH: 2.32 u[IU]/mL (ref 0.35–4.50)

## 2019-02-02 ENCOUNTER — Other Ambulatory Visit: Payer: Self-pay | Admitting: Cardiovascular Disease

## 2019-02-04 NOTE — Telephone Encounter (Signed)
This is a CHF pt.

## 2019-02-06 ENCOUNTER — Other Ambulatory Visit (HOSPITAL_COMMUNITY): Payer: Self-pay | Admitting: Cardiology

## 2019-02-12 ENCOUNTER — Telehealth: Payer: Self-pay | Admitting: *Deleted

## 2019-02-12 NOTE — Telephone Encounter (Signed)
   [facility] Pre-operative Risk Assessment    Request for surgical clearance:  1. What type of surgery is being performed? LUMBAR ESI   2. When is this surgery scheduled? 02/21/2019  3. What type of clearance is required (medical clearance vs. Pharmacy clearance to hold med vs. Both)? PHARMACY  4. Are there any medications that need to be held prior to surgery and how long? HOLD XARELTO FOR 3 DAYS PRIOR  5. Practice name and name of physician performing surgery? [facility], Suella Broad, MD  6. What is your office phone number [phone]   7.   What is your office fax number  [phone], ATTN: SUSAN  8.   Anesthesia type (None, local, MAC, general) ? NOT STATED   Real Cona 02/12/2019, 6:11 PM  _________________________________________________________________   (provider comments below)

## 2019-02-13 NOTE — Telephone Encounter (Signed)
Spoke with Marlowe Kays and Dr. Nelva Bush office. Per Dr. Nelva Bush ok to hold Xarelto 2 days prior to procedure.

## 2019-02-13 NOTE — Telephone Encounter (Signed)
Patient with diagnosis of afib on Xarelto for anticoagulation.    Procedure: LUMBAR ESI  Date of procedure: 02/21/2019  CHADS2-VASc score of  6 (CHF, HTN, AGE, DM2, stroke/tia x 2, CAD, AGE, female)  CrCl 55 ml/min  Office policy would be to hold Xarelto 3 days prior to procedure. However patient is at high risk off anticoagulation due to history of CVA. I will route to MD for input.

## 2019-02-13 NOTE — Telephone Encounter (Signed)
Called Dr. Nelva Bush office. They are putting in a message to him about length of hold and will call us back.

## 2019-02-13 NOTE — Telephone Encounter (Signed)
   Primary Cardiologist: [name]   Chart reviewed as part of pre-operative protocol coverage. Given past medical history and time since last visit, based on ACC/AHA guidelines, [place] would be at acceptable risk for the planned procedure without further cardiovascular testing.   Hold Xarelto 2 days prior to procedure.  I will route this recommendation to the requesting party via Epic fax function and remove from pre-op pool.  Please call with questions.  Lyda Jester, PA-C 02/13/2019, 4:21 PM

## 2019-02-13 NOTE — Telephone Encounter (Signed)
Would see if she could get by with holding the Xarelto x 2 days (ask Dr. Nelva Bush).  If not, can hold for 3.

## 2019-02-21 ENCOUNTER — Other Ambulatory Visit: Payer: Self-pay

## 2019-02-21 DIAGNOSIS — M5136 Other intervertebral disc degeneration, lumbar region: Secondary | ICD-10-CM | POA: Diagnosis not present

## 2019-02-21 DIAGNOSIS — R748 Abnormal levels of other serum enzymes: Secondary | ICD-10-CM

## 2019-02-27 ENCOUNTER — Ambulatory Visit (HOSPITAL_COMMUNITY): Payer: PPO

## 2019-03-06 ENCOUNTER — Telehealth (HOSPITAL_COMMUNITY): Payer: Self-pay | Admitting: Pharmacy Technician

## 2019-03-06 NOTE — Telephone Encounter (Signed)
Advanced Heart Failure Patient Advocate Encounter   Patient was approved for $6500.00 grant from Patient [facility]) to provide copayment coverage for Tadalafil.  This will keep the out of pocket expense at $0.      Member ID: [id number] Group ID: [id number] RxBin: [id number] Dates of Eligibility: 03/21/2019 through 03/19/2020  Jodelle Gross (CPhT) [facility] Patient Advocate  03/06/2019 2:04 PM

## 2019-03-17 ENCOUNTER — Other Ambulatory Visit (HOSPITAL_COMMUNITY): Payer: Self-pay | Admitting: Cardiology

## 2019-03-18 DIAGNOSIS — G4733 Obstructive sleep apnea (adult) (pediatric): Secondary | ICD-10-CM | POA: Diagnosis not present

## 2019-03-18 DIAGNOSIS — J9610 Chronic respiratory failure, unspecified whether with hypoxia or hypercapnia: Secondary | ICD-10-CM | POA: Diagnosis not present

## 2019-03-20 ENCOUNTER — Other Ambulatory Visit: Payer: Self-pay

## 2019-03-20 ENCOUNTER — Ambulatory Visit (HOSPITAL_COMMUNITY)
Admission: RE | Admit: 2019-03-20 | Discharge: 2019-03-20 | Disposition: A | Discharge status: Home / Self Care | Payer: PPO | Source: Ambulatory Visit | Attending: Cardiology | Admitting: Cardiology

## 2019-03-20 DIAGNOSIS — I509 Heart failure, unspecified: Secondary | ICD-10-CM | POA: Insufficient documentation

## 2019-03-20 DIAGNOSIS — I082 Rheumatic disorders of both aortic and tricuspid valves: Secondary | ICD-10-CM | POA: Insufficient documentation

## 2019-03-20 DIAGNOSIS — I272 Pulmonary hypertension, unspecified: Secondary | ICD-10-CM | POA: Diagnosis not present

## 2019-03-20 DIAGNOSIS — I4891 Unspecified atrial fibrillation: Secondary | ICD-10-CM | POA: Diagnosis not present

## 2019-03-20 DIAGNOSIS — I110 Hypertensive heart disease with heart failure: Secondary | ICD-10-CM | POA: Diagnosis not present

## 2019-03-20 DIAGNOSIS — Z8673 Personal history of transient ischemic attack (TIA), and cerebral infarction without residual deficits: Secondary | ICD-10-CM | POA: Diagnosis not present

## 2019-03-20 NOTE — Progress Notes (Signed)
  2D Echocardiogram has been performed.  [name] 03/20/2019, 10:19 AM

## 2019-03-22 ENCOUNTER — Ambulatory Visit: Payer: PPO

## 2019-04-03 DIAGNOSIS — Z961 Presence of intraocular lens: Secondary | ICD-10-CM | POA: Diagnosis not present

## 2019-04-03 DIAGNOSIS — H26493 Other secondary cataract, bilateral: Secondary | ICD-10-CM | POA: Diagnosis not present

## 2019-04-07 ENCOUNTER — Other Ambulatory Visit (HOSPITAL_COMMUNITY): Payer: Self-pay | Admitting: Internal Medicine

## 2019-04-07 ENCOUNTER — Other Ambulatory Visit: Payer: Self-pay | Admitting: Internal Medicine

## 2019-04-08 ENCOUNTER — Other Ambulatory Visit: Payer: Self-pay

## 2019-04-08 ENCOUNTER — Other Ambulatory Visit (INDEPENDENT_AMBULATORY_CARE_PROVIDER_SITE_OTHER): Payer: PPO

## 2019-04-08 DIAGNOSIS — R748 Abnormal levels of other serum enzymes: Secondary | ICD-10-CM

## 2019-04-08 LAB — VITAMIN D 25 HYDROXY (VIT D DEFICIENCY, FRACTURES): VITD: 58.7 ng/mL (ref 30.00–100.00)

## 2019-04-08 LAB — GAMMA GT: GGT: 14 U/L (ref 7–51)

## 2019-04-08 LAB — ALKALINE PHOSPHATASE: Alkaline Phosphatase: 128 U/L — ABNORMAL HIGH (ref 39–117)

## 2019-04-16 ENCOUNTER — Other Ambulatory Visit: Payer: Self-pay | Admitting: *Deleted

## 2019-04-16 DIAGNOSIS — R748 Abnormal levels of other serum enzymes: Secondary | ICD-10-CM

## 2019-04-24 ENCOUNTER — Ambulatory Visit
Admission: RE | Admit: 2019-04-24 | Discharge: 2019-04-24 | Disposition: A | Discharge status: Home / Self Care | Payer: PPO | Source: Ambulatory Visit | Attending: Internal Medicine | Admitting: Internal Medicine

## 2019-04-24 DIAGNOSIS — R748 Abnormal levels of other serum enzymes: Secondary | ICD-10-CM

## 2019-04-24 DIAGNOSIS — K8020 Calculus of gallbladder without cholecystitis without obstruction: Secondary | ICD-10-CM | POA: Diagnosis not present

## 2019-04-29 ENCOUNTER — Other Ambulatory Visit: Payer: Self-pay | Admitting: Internal Medicine

## 2019-04-29 NOTE — Telephone Encounter (Signed)
Last ov:12/21/2018 Last filled:12/21/2018

## 2019-05-08 DIAGNOSIS — M545 Low back pain: Secondary | ICD-10-CM | POA: Diagnosis not present

## 2019-05-08 DIAGNOSIS — M5136 Other intervertebral disc degeneration, lumbar region: Secondary | ICD-10-CM | POA: Diagnosis not present

## 2019-05-08 DIAGNOSIS — G894 Chronic pain syndrome: Secondary | ICD-10-CM | POA: Diagnosis not present

## 2019-05-08 DIAGNOSIS — M546 Pain in thoracic spine: Secondary | ICD-10-CM | POA: Diagnosis not present

## 2019-05-16 ENCOUNTER — Telehealth (HOSPITAL_COMMUNITY): Payer: Self-pay | Admitting: Cardiology

## 2019-05-16 NOTE — Telephone Encounter (Signed)
Medical clearance returned via fax to [phone] Patient is medical cleared to proceed with surgery, may hold xarelto x 3 day pre procedure per Dr Aundra Dubin

## 2019-05-20 ENCOUNTER — Ambulatory Visit: Payer: PPO

## 2019-05-28 ENCOUNTER — Ambulatory Visit (HOSPITAL_COMMUNITY)
Admission: RE | Admit: 2019-05-28 | Discharge: 2019-05-28 | Disposition: A | Discharge status: Home / Self Care | Payer: PPO | Source: Ambulatory Visit | Attending: Cardiology | Admitting: Cardiology

## 2019-05-28 ENCOUNTER — Other Ambulatory Visit: Payer: Self-pay

## 2019-05-28 ENCOUNTER — Encounter (HOSPITAL_COMMUNITY): Payer: Self-pay

## 2019-05-28 DIAGNOSIS — I5032 Chronic diastolic (congestive) heart failure: Secondary | ICD-10-CM

## 2019-05-28 NOTE — Patient Instructions (Signed)
Our office will contact you to schedule lab work and your follow up appointment in 3 months.  No medication changes were made. Please continue all current medications as prescribed.   Please call our office if you have any questions or concerns.  At the [facility], you and your health needs are our priority. As part of our continuing mission to provide you with exceptional heart care, we have created designated Provider Care Teams. These Care Teams include your primary Cardiologist (physician) and Advanced Practice Providers (APPs- Physician Assistants and Nurse Practitioners) who all work together to provide you with the care you need, when you need it.   You may see any of the following providers on your designated Care Team at your next follow up: Marland Kitchen Dr Glori Bickers . Dr Loralie Champagne . Darrick Grinder, NP   Please be sure to bring in all your medications bottles to every appointment.

## 2019-05-28 NOTE — Progress Notes (Signed)
Larey Dresser, MD  Vaanya Shambaugh R, CMA        1. Please arrange BMET  2. Followup 3 months.    Orders placed. Orders sent to schedulers

## 2019-05-28 NOTE — Progress Notes (Signed)
Heart Failure TeleHealth Note  Due to national recommendations of social distancing due to [place] 19, Audio/video telehealth visit is felt to be most appropriate for this patient at this time.  See MyChart message from today for patient consent regarding telehealth for [facility].  Date:  05/28/2019   ID:  [name], DOB Sep 03, 1951, MRN [id number]  Location: Home  Provider location: [place] [facility] Type of Visit: Established patient   PCP:  [name], MD  Cardiologist:  [name]  Chief Complaint:  Shortness of breath   History of Present Illness: [name] is a 68 y.o. female who presents via audio/video conferencing for a telehealth visit today.     she denies symptoms worrisome for COVID 72.   68 y.o. with PMH of pulmonary HTN, morbid obesity, chronic afib (on Xarelto, CVA in 2012, OHS/OSA (Had U PE3 surgery, and chronic respiratory failure with hypoxemia on continuous 02 at 2 lpm via [place]).  Admitted 3/2 ->4/53/64 with A/C diastolic CHF and A/C respiratory failure. Pt initially refused Bipap so venti mask used. Eventually tolerated transition to BiPAP. Overall pt diuresed from 285 lbs down to 229 lbs with 38 L of diuresis.  Echo 11/07/16 EF 55-60%, PASP 43m Hg, mildly dilated RV with moderate to severely decreased RV systolic function. RHC/LHC in 3/18 showed no coronary disease and confirmed severe PH.    Echo in 7/20 showed EF 60-65%, mild LVH, normal RV, PASP 38 mmHg, mild AS.   She has been using Bipap nightly but is only using oxygen with prolonged exertion such as walking at the gym ([place] [employer] [place]  No significant dyspnea walking on flat ground.  No lightheadedness, no chest pain.  No orthopnea/PND.     Labs (1/19): K 3.7, creatinine 1.37 Labs (11/19): K 3.9, creatinine 1.2, LDL 67, hgb 13.2 Labs (1/20): K 3.5, creatinine 1.34 Labs (5/20): K 3.9, creatinine 1.18, LDL 73  PMH 1. Chronic diastolic CHF with severe  pulmonary hypertension and prominent RV failure:  Echo (3/18) with EF 55-60%, PASP 925mHg, mildly dilated RV with moderate to severely decreased RV systolic function. - RHC/LHC 11/07/16: No angiographic CAD; PA 90/55, LVEDP 23 (PCWP inaccurate), CI 2.15 Fick/2.32 Thermo, shunt run negative, PVR 5.7 WU.  - Echo (7/20): EF 60-65%, mild LVH, normal RV, PASP 38 mmHg, mild AS.  2. Chronic hypercarbic/hypoxic respiratory failure with OHS/OSA. Sees Dr. McLake BellsUsing nightly BiPAP and oxygen by nasal cannula during the day.  3. Atrial fibrillation: Chronic - Rate controlled on Xarelto + atenolol  4. 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 PVR 5.7 WU on RHC 3/18. Suspect group 2 (elevated LA pressure) and group 3 (OHS/OSA) PH. However, cannot rule out group 1 component. She had V/Q scan that was not suggestive of chronic PE and high resolution chest CT that was not suggestive of ILD. ANA, RF, anti-SCL70 all negative.   5. H/o CVA 6. Hypothyroidism 7. H/o TKR 8. Aortic stenosis: Mild on 7/20 echo.   Current Outpatient Medications  Medication Sig Dispense Refill  . acetaZOLAMIDE (DIAMOX) 250 MG tablet TAKE 1 TABLET BY MOUTH EVERY DAY 90 tablet 1  . allopurinol (ZYLOPRIM) 100 MG tablet TAKE 1 TABLET BY MOUTH TWICE A DAY 180 tablet 0  . atenolol (TENORMIN) 50 MG tablet Take 1 tablet (50 mg total) by mouth daily. 90 tablet 3  . colchicine 0.6 MG tablet Take 1 tablet (0.6 mg total) by mouth  daily as needed (flareups). 60 tablet 1  . diclofenac sodium (VOLTAREN) 1 % GEL Apply 2 g topically 2 (two) times daily as needed (for arthritis).     Marland Kitchen levothyroxine (SYNTHROID) 75 MCG tablet TAKE 1 TABLET BY MOUTH EVERY DAY 90 tablet 1  . methocarbamol (ROBAXIN) 500 MG tablet TAKE 1/2 TABLET BY MOUTH AS NEEDED FOR MUSCLE SPASMS. 30 tablet 0  . potassium chloride (K-DUR) 10 MEQ tablet Take 2 tablets (20 mEq total) by mouth 2 (two) times daily. 360 tablet 3  . rosuvastatin  (CRESTOR) 5 MG tablet TAKE 1 TABLET BY MOUTH EVERY DAY 90 tablet 3  . tadalafil, PAH, (ALYQ) 20 MG tablet Take 2 tablets (40 mg total) by mouth daily. 180 tablet 3  . torsemide (DEMADEX) 20 MG tablet TAKE 2 TABLETS (40 MG TOTAL) BY MOUTH 2 (TWO) TIMES DAILY. 360 tablet 2  . traMADol (ULTRAM) 50 MG tablet TAKE 1 TABLET (50 MG TOTAL) BY MOUTH 3 (THREE) TIMES DAILY AS NEEDED. 60 tablet 1  . XARELTO 20 MG TABS tablet TAKE 1 TABLET BY MOUTH EVERY DAY WITH SUPPER 90 tablet 1   No current facility-administered medications for this encounter.     Allergies:   Ambien [zolpidem tartrate], Losartan, Prevacid [lansoprazole], Adhesive [tape], Keflex [cephalexin], Motrin [ibuprofen], Prilosec [omeprazole], Ace inhibitors, Benadryl [diphenhydramine hcl], and Spironolactone   Social History:  The patient  reports that she has never smoked. She has never used smokeless tobacco. She reports current alcohol use of about 1.0 standard drinks of alcohol per week. She reports that she does not use drugs.   Family History:  The patient's family history includes COPD in her mother; Diabetes in her father; Heart attack in her father; Hypertension in her brother, father, mother, and sister; Liver cancer in her father; Osteoporosis in her mother; Stroke in her maternal grandmother.   ROS:  Please see the history of present illness.   All other systems are personally reviewed and negative.   Exam:  (Video/Tele Health Call; Exam is subjective and or/visual.) BP 123/77, HR 54, oxygen saturation 96% General: Speaks in full sentences, no respiratory difficulty.  Neck: Thick, I do not think there was JVD.  Lungs: Normal respiratory effort with conversaion.  Abdomen: Non-distended per patient's report.  Extremities: no edema.  Neuro: Alert, oriented x 3.   Recent Labs: 09/19/2018: Hemoglobin 12.6; Platelets 136 01/30/2019: ALT 7; BUN 25; Creatinine, Ser 1.18; Potassium 3.9; Sodium 140; TSH 2.32  Personally reviewed   Wt  Readings from Last 3 Encounters:  09/19/18 109.6 kg (241 lb 9.6 oz)  08/15/18 109.4 kg (241 lb 3.2 oz)  07/09/18 106.8 kg (235 lb 8 oz)      ASSESSMENT AND PLAN:  1. Chronic diastolic CHF:  Associated with severe pulmonary hypertension and prominent RV failure.  NYHA class II symptoms.  Most recent echo in 7/20 showed some improvement with normal-appearing RV and only mild pulmonary hypertension by noninvasive measure. More limited by orthopedic issues than CHF.  She does not appear volume overloaded on video exam and weight is stable.   - Continue torsemide 53m in the am and 20 mg in the pm.  - Continue diamox 250 mg daily.  - I will arrange for a BMET.  2. Chronic hypercarbic/hypoxic respiratory failure with OHS/OSA: Sees Dr. MLake Bells  Using bipap at night but has been able to stop oxygen during the day (using only with prolonged/heavy exertion).   - Compliant with Bipap nightly.  - Continue regular exercise and  efforts at weight loss.   3. Atrial fibrillation: Chronic, rate controlled  - Continue atenolol 50 mg daily.    - Continue Xarelto for anticoagulation.   4. 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 PVR 5.7 WU by [place] in 3/18. Suspect group 2 (elevated LA pressure) and group 3 (OHS/OSA) PH. However, cannot rule out group 1 component. She had V/Q scan that was not suggestive of chronic PE and high resolution chest CT that was not suggestive of ILD. ANA, RF, anti-SCL70 all negative.  She did not have a marked improvement from taking Adcirca => suspect predominantly group 2 and 3 PH.  Will hold off on additional pulmonary vasodilators. Echo in 7/20 showed improvement in RV function.  - Continue Adcirca 40 mg daily.  - Continue BiPAP at night for OSA. She no longer requires oxygen during the day. - 6 minute walk next appt in office.  5. HTN: BP controlled.   COVID screen The patient does not have any symptoms that suggest any further  testing/ screening at this time.  Social distancing reinforced today.  Patient Risk: After full review of this patients clinical status, I feel that they are at moderate risk for cardiac decompensation at this time.  Relevant cardiac medications were reviewed at length with the patient today. The patient does not have concerns regarding their medications at this time.   Recommended follow-up:  3 months in office.   Today, I have spent 16 minutes with the patient with telehealth technology discussing the above issues .    Signed, Loralie Champagne, MD  05/28/2019  [place] [address] [facility] and [place] [address] [phone] (office) [phone] (fax)

## 2019-05-30 DIAGNOSIS — M5136 Other intervertebral disc degeneration, lumbar region: Secondary | ICD-10-CM | POA: Diagnosis not present

## 2019-05-30 DIAGNOSIS — Z79899 Other long term (current) drug therapy: Secondary | ICD-10-CM

## 2019-05-31 MED ORDER — METHOCARBAMOL 500 MG PO TABS: ORAL_TABLET | ORAL | 0 refills | Status: DC

## 2019-05-31 NOTE — Telephone Encounter (Signed)
Ok to refill the methocarbamol  X 1   Let set up for November for CPX    Also can we add on  lfts and cbcdiff to the labs to be done by Dr Aundra Dubin . Dx medication management

## 2019-06-04 ENCOUNTER — Other Ambulatory Visit: Payer: Self-pay

## 2019-06-04 ENCOUNTER — Ambulatory Visit: Payer: PPO

## 2019-06-04 DIAGNOSIS — Z79899 Other long term (current) drug therapy: Secondary | ICD-10-CM

## 2019-06-04 LAB — CBC WITH DIFFERENTIAL/PLATELET
Basophils Absolute: 0 10*3/uL (ref 0.0–0.1)
Basophils Relative: 0.5 % (ref 0.0–3.0)
Eosinophils Absolute: 0.3 10*3/uL (ref 0.0–0.7)
Eosinophils Relative: 4.1 % (ref 0.0–5.0)
HCT: 40.5 % (ref 36.0–46.0)
Hemoglobin: 13.4 g/dL (ref 12.0–15.0)
Lymphocytes Relative: 16.8 % (ref 12.0–46.0)
Lymphs Abs: 1.1 10*3/uL (ref 0.7–4.0)
MCHC: 33 g/dL (ref 30.0–36.0)
MCV: 99.2 fl (ref 78.0–100.0)
Monocytes Absolute: 0.4 10*3/uL (ref 0.1–1.0)
Monocytes Relative: 6.9 % (ref 3.0–12.0)
Neutro Abs: 4.6 10*3/uL (ref 1.4–7.7)
Neutrophils Relative %: 71.7 % (ref 43.0–77.0)
Platelets: 135 10*3/uL — ABNORMAL LOW (ref 150.0–400.0)
RBC: 4.08 Mil/uL (ref 3.87–5.11)
RDW: 15.6 % — ABNORMAL HIGH (ref 11.5–15.5)
WBC: 6.3 10*3/uL (ref 4.0–10.5)

## 2019-06-04 LAB — HEPATIC FUNCTION PANEL
ALT: 8 U/L (ref 0–35)
AST: 18 U/L (ref 0–37)
Albumin: 4.2 g/dL (ref 3.5–5.2)
Alkaline Phosphatase: 117 U/L (ref 39–117)
Bilirubin, Direct: 0.1 mg/dL (ref 0.0–0.3)
Total Bilirubin: 0.6 mg/dL (ref 0.2–1.2)
Total Protein: 6.2 g/dL (ref 6.0–8.3)

## 2019-06-16 ENCOUNTER — Other Ambulatory Visit: Payer: Self-pay | Admitting: Internal Medicine

## 2019-06-25 DIAGNOSIS — G4733 Obstructive sleep apnea (adult) (pediatric): Secondary | ICD-10-CM | POA: Diagnosis not present

## 2019-06-25 DIAGNOSIS — J9610 Chronic respiratory failure, unspecified whether with hypoxia or hypercapnia: Secondary | ICD-10-CM | POA: Diagnosis not present

## 2019-07-04 ENCOUNTER — Other Ambulatory Visit: Payer: Self-pay | Admitting: Internal Medicine

## 2019-07-04 ENCOUNTER — Other Ambulatory Visit: Payer: Self-pay | Admitting: Cardiology

## 2019-07-31 ENCOUNTER — Other Ambulatory Visit: Payer: Self-pay

## 2019-08-05 ENCOUNTER — Other Ambulatory Visit: Payer: Self-pay | Admitting: Internal Medicine

## 2019-08-20 ENCOUNTER — Other Ambulatory Visit: Payer: Self-pay | Admitting: Internal Medicine

## 2019-08-21 NOTE — Telephone Encounter (Signed)
Last ov: 4
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
## 2019-08-27 ENCOUNTER — Other Ambulatory Visit: Payer: Self-pay

## 2019-08-27 ENCOUNTER — Encounter: Payer: Self-pay | Admitting: Internal Medicine

## 2019-08-27 ENCOUNTER — Telehealth (INDEPENDENT_AMBULATORY_CARE_PROVIDER_SITE_OTHER): Payer: PPO | Admitting: Internal Medicine

## 2019-08-27 DIAGNOSIS — Q782 Osteopetrosis: Secondary | ICD-10-CM

## 2019-08-27 DIAGNOSIS — I10 Essential (primary) hypertension: Secondary | ICD-10-CM | POA: Diagnosis not present

## 2019-08-27 DIAGNOSIS — R748 Abnormal levels of other serum enzymes: Secondary | ICD-10-CM | POA: Diagnosis not present

## 2019-08-27 DIAGNOSIS — M5136 Other intervertebral disc degeneration, lumbar region: Secondary | ICD-10-CM

## 2019-08-27 DIAGNOSIS — R7303 Prediabetes: Secondary | ICD-10-CM

## 2019-08-27 DIAGNOSIS — M47819 Spondylosis without myelopathy or radiculopathy, site unspecified: Secondary | ICD-10-CM

## 2019-08-27 DIAGNOSIS — Z79899 Other long term (current) drug therapy: Secondary | ICD-10-CM | POA: Diagnosis not present

## 2019-08-27 MED ORDER — TRAMADOL HCL 50 MG PO TABS: 50.0000 mg | ORAL_TABLET | Freq: Three times a day (TID) | ORAL | 1 refills | Status: DC | PRN

## 2019-09-09 ENCOUNTER — Other Ambulatory Visit: Payer: Self-pay

## 2019-09-09 ENCOUNTER — Ambulatory Visit (INDEPENDENT_AMBULATORY_CARE_PROVIDER_SITE_OTHER): Payer: PPO | Admitting: Adult Health

## 2019-09-09 ENCOUNTER — Encounter: Payer: Self-pay | Admitting: Adult Health

## 2019-09-09 DIAGNOSIS — I5032 Chronic diastolic (congestive) heart failure: Secondary | ICD-10-CM | POA: Diagnosis not present

## 2019-09-09 DIAGNOSIS — I270 Primary pulmonary hypertension: Secondary | ICD-10-CM | POA: Diagnosis not present

## 2019-09-09 DIAGNOSIS — G4733 Obstructive sleep apnea (adult) (pediatric): Secondary | ICD-10-CM

## 2019-09-09 NOTE — Patient Instructions (Signed)
Continue on CPAP  At bedtime  .  Work on healthy weight  Do not drive if sleepy.  Continue with follow up with Cardiology as planned.  Continue on Torsemide .  Low salt diet.  Continue on Adcirca  Follow up with Dr. Hermina Staggers or Damyiah Moxley NP  in 6  months and As needed   Please contact office for sooner follow up if symptoms do not improve or worsen or seek emergency care

## 2019-09-09 NOTE — Progress Notes (Signed)
Virtual Visit via Telephone Note  I connected with [name] on 09/09/19 at  9:30 AM EST by telephone and verified that I am speaking with the correct person using two identifiers.  Location: Patient: Home  Provider: Office    I discussed the limitations, risks, security and privacy concerns of performing an evaluation and management service by telephone and the availability of in person appointments. I also discussed with the patient that there may be a patient responsible charge related to this service. The patient expressed understanding and agreed to proceed.   History of Present Illness: 69 year old female former smoker followed for chronic hypoxic respiratory failure, obesity hypoventilation syndrome.  She has pulmonary hypertension secondary to OHS and significant diastolic heart failure.  She is on CPAP  at bedtime.  She uses oxygen 2 L with activity and as needed. Medical history significant for CVA in 2012, chronic A. fib on Xarelto. Followed by cardiology/CHF clinic.  On Adcirca for pulmonary hypertension  Today's televisit is a 11-monthfollow-up for OSA/OHS, oxygen dependent respiratory failure, diastolic heart failure and pulmonary hypertension. Since last visit patient says she is doing well on her current regimen.  She remains active at home.  She uses a cane and walker at time  to get around to try to walk in her home.  [facility] but not going recently due to [id number]. Says not having to use oxygen with activity .  O2 saturations walking 93-95% on room air. At rest 97-99% on room air.   She remains on CPAP at bedtime.  She says she uses her CPAP each night.  She never misses a night.  CPAP download shows excellent compliance at 100% usage.  Daily average usage at 7.5 hours.  Patient is on auto CPAP 5 to 20 cm H2O.  AHI 4.1.  Minimal leaks.  Says she if she feels that she benefits from CPAP.  She has no significant daytime  sleepiness.  She has chronic diastolic heart failure and pulmonary hypertension.  She is followed by the CHF clinic.  She is on Demadex 40 mg in the morning and 20 m g in evening (most of the time ) She remains on Adcirca 40 mg daily.  She has chronic A. fib and is on Xarelto. Says she has been doing well on current regimen . No increased dyspnea or leg swelling    Observations/Objective: Per patient vital signs today showed heart rate of 59, oxygen level 97% on room air.  Blood pressure is 97/64.  severe obstructive sleep apnea in 2012 with AHI 114  ABG  10/06/2016 performed on oxygen: 7.43/53.1/65.0/34.7 11/13/2016 7.43/82/88/96% on 4L   RHC 11/07/16 TPG 50-54mHg PAP mean: 55 mmHg PCWP: 63 mmHg (not accurate). LVEDP: 28. PVR using LVEDP and mean PA 5.7 WU.  Cardiac output/index by Fick: 4.74/2.15. RV pressures/EDP: 97/13/21 mmHg Shunt showed no left to right shunt.   Cardiac imaging: March 2018 echocardiogram LVEF 55-60%, ventricular septum consistent with RV overload, RV dilated systolic function moderate to severely reduced, PA pressure estimate 90 mmHg  Imaging: February first 2018 VQ scan no evidence of blood clot  Imaging: 10/06/2016 high-resolution CT scan of the chest: Images independently reviewed by me today in clinic, there is normal pulmonary parenchyma with the exception of interlobular septal thickening and groundglass worse in the dependent sections of all lobes, there is a moderate size right-sided pleural effusion, pulmonary vascular engorgement.   2018 -changed  from BIPAP to CPAP .   Assessment and Plan: OHS/OSA stable on CPAP.  She has excellent control compliance.  No changes  Pulmonary hypertension continue on current regimen and follow-up with cardiology.  Dyspnea level and activity tolerance seems to be at baseline.  She is not requiring oxygen with activity.  O2 saturation goal is greater than 90%. Continue on diuretics and Adcirca.  Chronic  diastolic heart failure-appears to be stable continue on current regimen encouraged on the low salt healthy diet.  Activity as tolerated  Plan  Patient Instructions  Continue on CPAP  At bedtime  .  Work on healthy weight  Do not drive if sleepy.  Continue with follow up with Cardiology as planned.  Continue on Torsemide .  Low salt diet.  Continue on Adcirca  Follow up with Dr. Hermina Staggers or German Manke NP  in 6  months and As needed   Please contact office for sooner follow up if symptoms do not improve or worsen or seek emergency care       Follow Up Instructions: Follow-up in 6 months and as needed   I discussed the assessment and treatment plan with the patient. The patient was provided an opportunity to ask questions and all were answered. The patient agreed with the plan and demonstrated an understanding of the instructions.   The patient was advised to call back or seek an in-person evaluation if the symptoms worsen or if the condition fails to improve as anticipated.  I provided 28  minutes of non-face-to-face time during this encounter.   [name], NP

## 2019-09-09 NOTE — Addendum Note (Signed)
Addended by: Cecilie Kicks on: 09/09/2019 04:32 PM   Modules accepted: Orders

## 2019-09-10 DIAGNOSIS — D485 Neoplasm of uncertain behavior of skin: Secondary | ICD-10-CM | POA: Diagnosis not present

## 2019-09-10 DIAGNOSIS — L920 Granuloma annulare: Secondary | ICD-10-CM | POA: Diagnosis not present

## 2019-09-26 ENCOUNTER — Ambulatory Visit: Payer: PPO | Attending: Internal Medicine

## 2019-09-26 DIAGNOSIS — Z23 Encounter for immunization: Secondary | ICD-10-CM | POA: Insufficient documentation

## 2019-09-26 NOTE — Progress Notes (Signed)
Covid-19 Vaccination Clinic  Name:  [name]    MRN: [id number] DOB: [birth year]  09/26/2019  [name] was observed post Covid-19 immunization for 15 minutes without incidence. She was provided with Vaccine Information Sheet and instruction to access the V-Safe system.   [name] was instructed to call 911 with any severe reactions post vaccine: Marland Kitchen Difficulty breathing  . Swelling of your face and throat  . A fast heartbeat  . A bad rash all over your body  . Dizziness and weakness    Immunizations Administered    Name Date Dose VIS Date Route   Pfizer COVID-19 Vaccine 09/26/2019  1:01 PM 0.3 mL 08/16/2019 Intramuscular   Manufacturer: Green   Lot: [id number]   [place]: [id number]

## 2019-09-30 ENCOUNTER — Other Ambulatory Visit: Payer: Self-pay

## 2019-09-30 ENCOUNTER — Telehealth: Payer: Self-pay | Admitting: Internal Medicine

## 2019-09-30 DIAGNOSIS — I110 Hypertensive heart disease with heart failure: Secondary | ICD-10-CM

## 2019-09-30 DIAGNOSIS — Z79899 Other long term (current) drug therapy: Secondary | ICD-10-CM

## 2019-09-30 NOTE — Telephone Encounter (Signed)
Lvm for pt to call back.

## 2019-09-30 NOTE — Telephone Encounter (Signed)
Pt states she needs bmp from cardiology please advise if okay

## 2019-09-30 NOTE — Telephone Encounter (Signed)
Pt called in regards to the blood work she needs done for her Cardiologist, Dr. Loralie Champagne [phone]. I do not see any lab orders. Pt stated that he said she can have it done at our location since it is convenient  for her. Pt would like to be contacted at [phone].

## 2019-09-30 NOTE — Telephone Encounter (Signed)
Pt has been made lab appt

## 2019-09-30 NOTE — Telephone Encounter (Signed)
Yes please order bmp dx med management   Hypertensive heart disease

## 2019-10-01 DIAGNOSIS — H0014 Chalazion left upper eyelid: Secondary | ICD-10-CM | POA: Diagnosis not present

## 2019-10-04 ENCOUNTER — Other Ambulatory Visit: Payer: Self-pay

## 2019-10-04 ENCOUNTER — Other Ambulatory Visit (INDEPENDENT_AMBULATORY_CARE_PROVIDER_SITE_OTHER): Payer: PPO

## 2019-10-04 DIAGNOSIS — I110 Hypertensive heart disease with heart failure: Secondary | ICD-10-CM

## 2019-10-04 DIAGNOSIS — I5032 Chronic diastolic (congestive) heart failure: Secondary | ICD-10-CM

## 2019-10-04 DIAGNOSIS — Z79899 Other long term (current) drug therapy: Secondary | ICD-10-CM | POA: Diagnosis not present

## 2019-10-04 LAB — BASIC METABOLIC PANEL
BUN: 24 mg/dL — ABNORMAL HIGH (ref 6–23)
CO2: 27 mEq/L (ref 19–32)
Calcium: 9.1 mg/dL (ref 8.4–10.5)
Chloride: 109 mEq/L (ref 96–112)
Creatinine, Ser: 1.12 mg/dL (ref 0.40–1.20)
GFR: 48.08 mL/min — ABNORMAL LOW (ref >60.00)
Glucose, Bld: 98 mg/dL (ref 70–99)
Potassium: 4.8 mEq/L (ref 3.5–5.1)
Sodium: 143 mEq/L (ref 135–145)

## 2019-10-07 DIAGNOSIS — H0014 Chalazion left upper eyelid: Secondary | ICD-10-CM | POA: Diagnosis not present

## 2019-10-08 ENCOUNTER — Other Ambulatory Visit: Payer: Self-pay | Admitting: Cardiology

## 2019-10-14 ENCOUNTER — Ambulatory Visit: Payer: PPO | Attending: Internal Medicine

## 2019-10-14 DIAGNOSIS — Z23 Encounter for immunization: Secondary | ICD-10-CM | POA: Insufficient documentation

## 2019-10-14 NOTE — Progress Notes (Signed)
   Covid-19 Vaccination Clinic  Name:  [name]    MRN: [id number] DOB: [birth year]  10/14/2019  [name] was observed post Covid-19 immunization for 15 minutes without incidence. She was provided with Vaccine Information Sheet and instruction to access the V-Safe system.   [name] was instructed to call 911 with any severe reactions post vaccine: Marland Kitchen Difficulty breathing  . Swelling of your face and throat  . A fast heartbeat  . A bad rash all over your body  . Dizziness and weakness    Immunizations Administered    Name Date Dose VIS Date Route   Pfizer COVID-19 Vaccine 10/14/2019 12:11 PM 0.3 mL 08/16/2019 Intramuscular   Manufacturer: [employer]   Lot: [id number]   [place]: [id number]

## 2019-10-15 ENCOUNTER — Telehealth: Payer: Self-pay | Admitting: Internal Medicine

## 2019-10-15 NOTE — Progress Notes (Signed)
  Chronic Care Management   Outreach Note  10/15/2019 Name: [name] MRN: [id number] DOB: 1950/11/11  Referred by: [name], MD Reason for referral : No chief complaint on file.   An unsuccessful telephone outreach was attempted today. The patient was referred to the pharmacist for assistance with care management and care coordination.   Follow Up Plan:   Raynicia Dukes [employer] Scheduler

## 2019-10-28 ENCOUNTER — Telehealth: Payer: Self-pay | Admitting: Internal Medicine

## 2019-10-28 NOTE — Chronic Care Management (AMB) (Signed)
  Chronic Care Management   Note  10/28/2019 Name: CAIDYN BLOSSOM MRN: [id number] DOB: [birth year]  Serena Colonel Packham is a 69 y.o. year old female who is a primary care patient of Panosh, Standley Brooking, MD. I reached out to Conley Rolls by phone today in response to a referral sent by Ms. Ilana S Honold's PCP, Panosh, Standley Brooking, MD.   Ms. Viglione was given information about Chronic Care Management services today including:  1. CCM service includes personalized support from designated clinical staff supervised by her physician, including individualized plan of care and coordination with other care providers 2. 24/7 contact phone numbers for assistance for urgent and routine care needs. 3. Service will only be billed when office clinical staff spend 20 minutes or more in a month to coordinate care. 4. Only one practitioner may furnish and bill the service in a calendar month. 5. The patient may stop CCM services at any time (effective at the end of the month) by phone call to the office staff. 6. The patient will be responsible for cost sharing (co-pay) of up to 20% of the service fee (after annual deductible is met).  Patient agreed to services and verbal consent obtained.   Follow up plan:   Raynicia Dukes [employer] Scheduler

## 2019-10-30 IMAGING — US ULTRASOUND ABDOMEN COMPLETE
1 series · 14 of 25 positions shown · non-contrast
Comparison: Chest CT dated 10/06/2016

CLINICAL DATA: 67-year-old female with elevated alkaline
phosphatase.

EXAM:
ABDOMEN ULTRASOUND COMPLETE

[Series 1: ultrasound abdomen complete · 0.25mm/px · 14 of 88 slices shown]
[im 1/88]
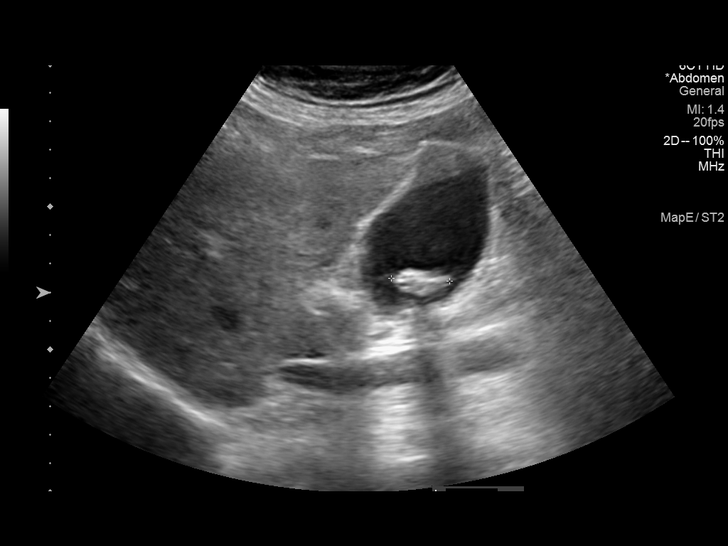
[im 8/88]
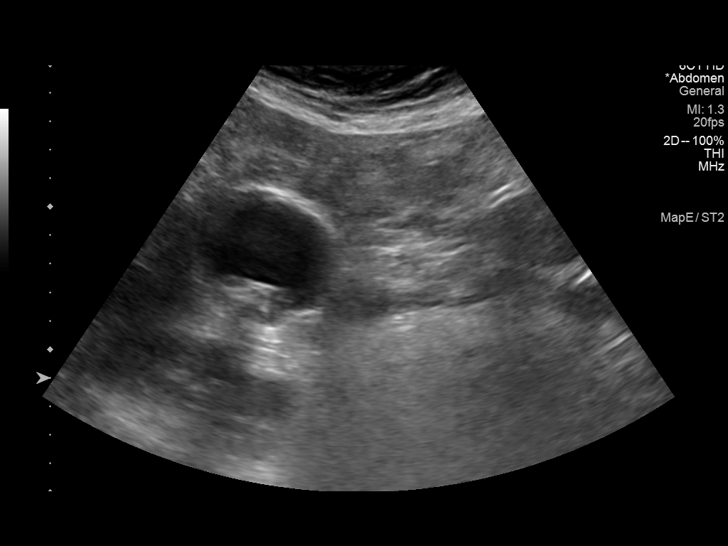
[im 15/88]
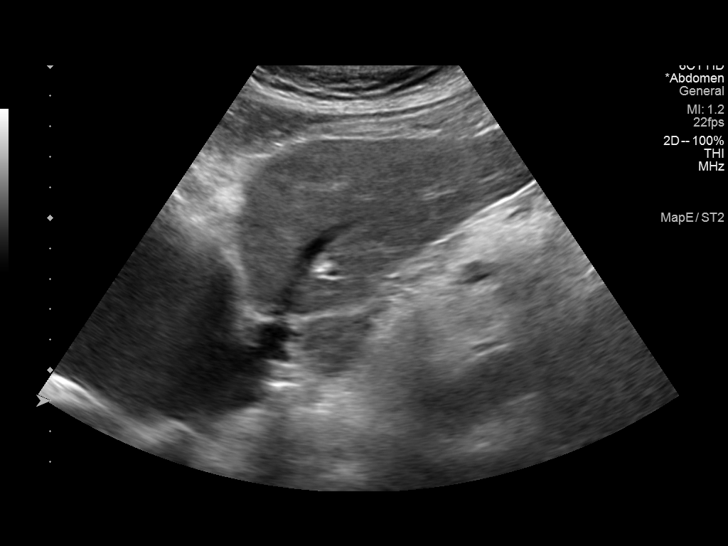
[im 22/88]
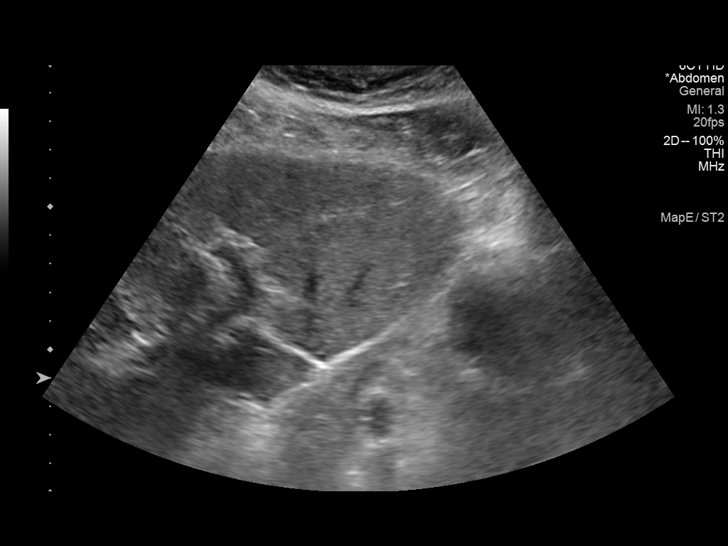
[im 30/88]
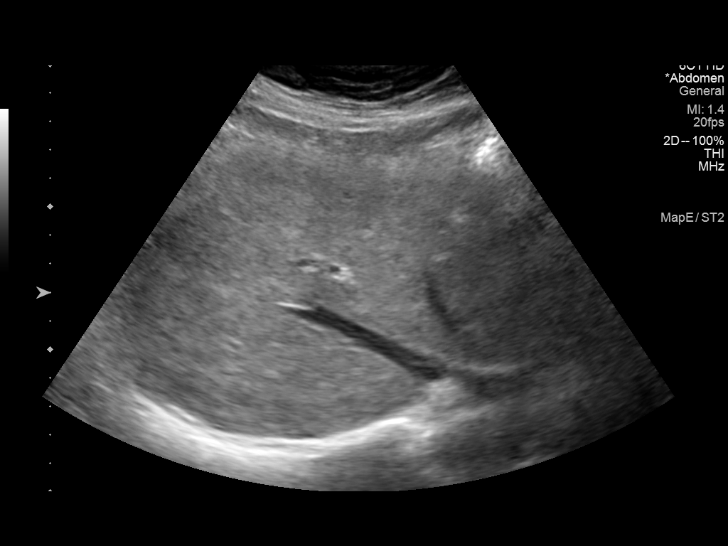
[im 33/88]
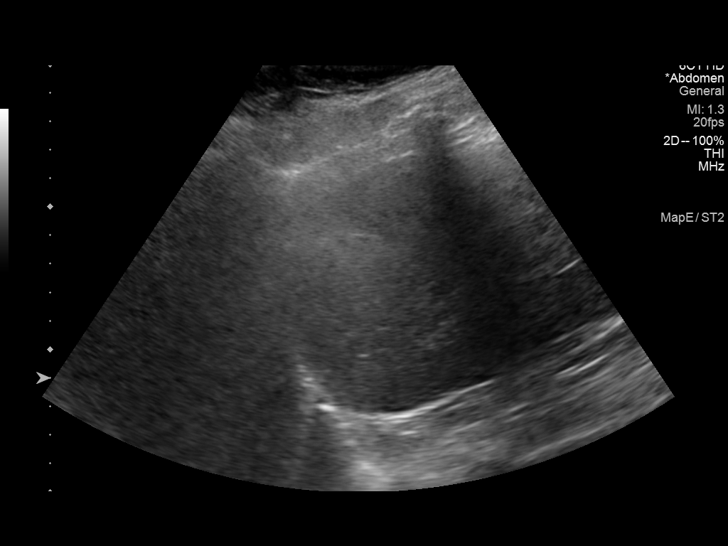
[im 40/88]
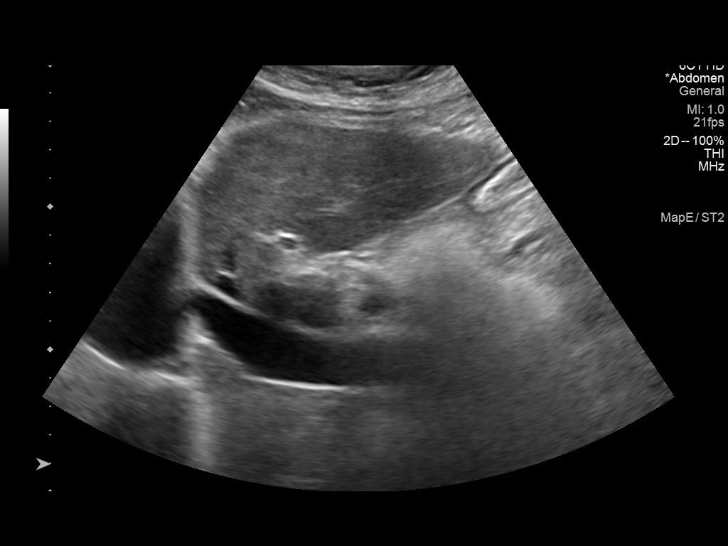
[im 48/88]
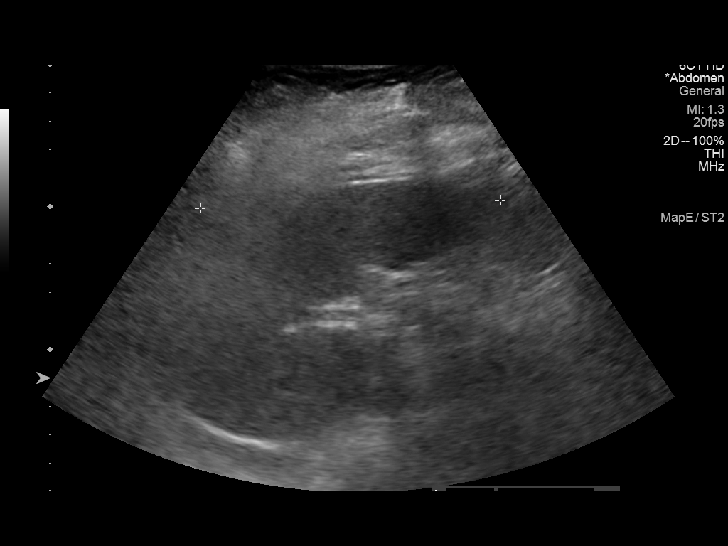
[im 55/88]
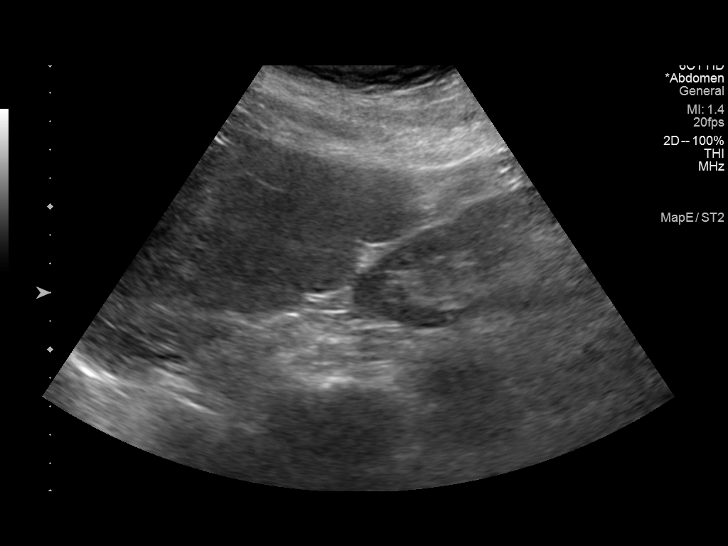
[im 59/88]
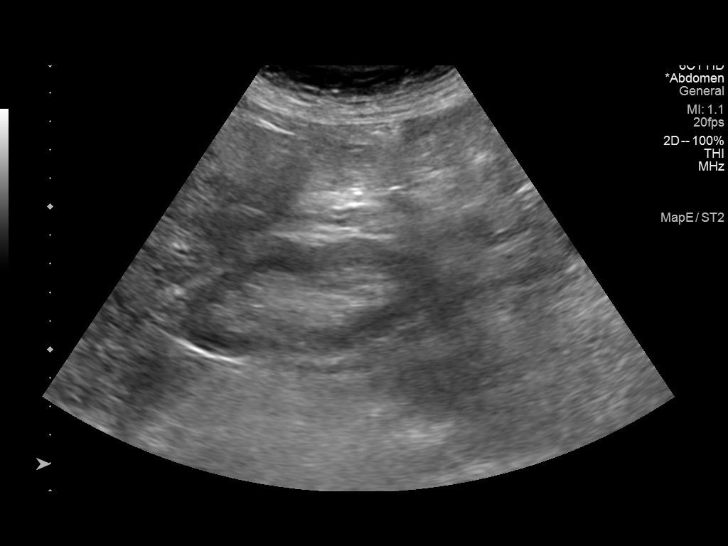
[im 66/88]
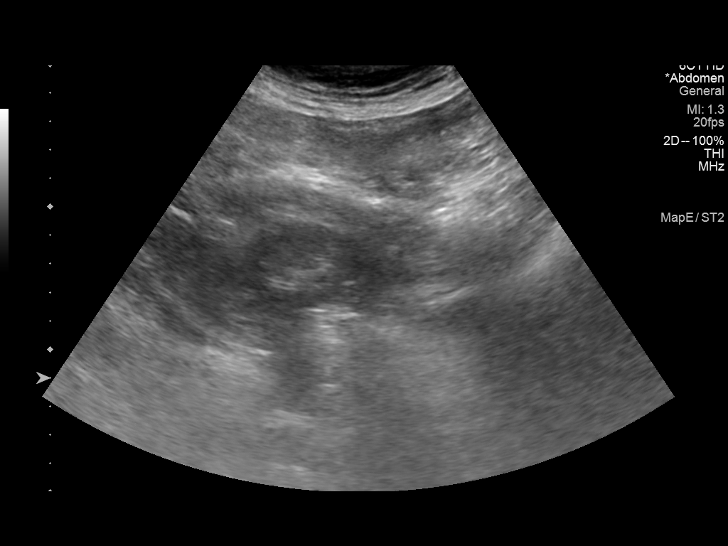
[im 73/88]
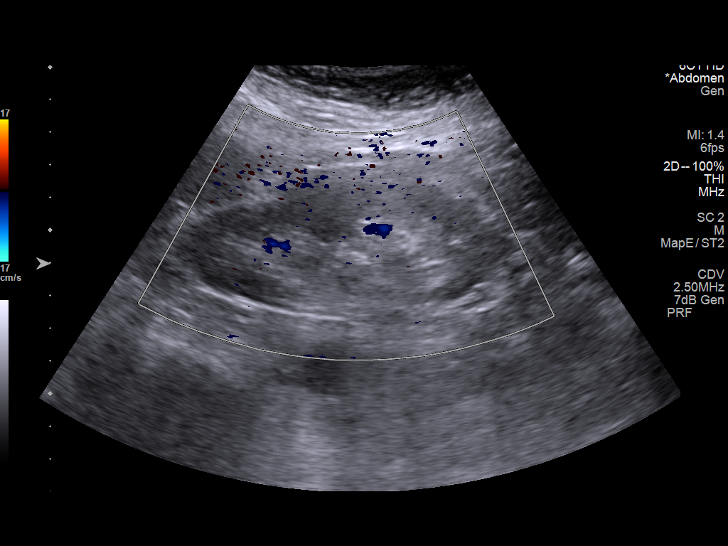
[im 80/88]
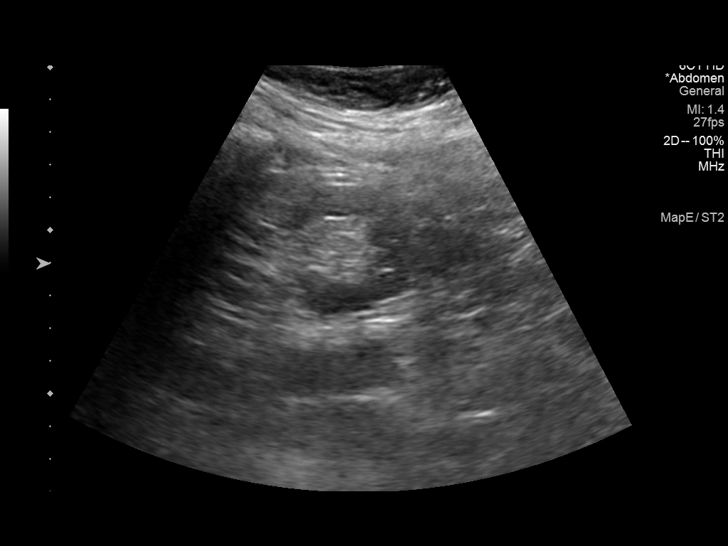
[im 88/88]
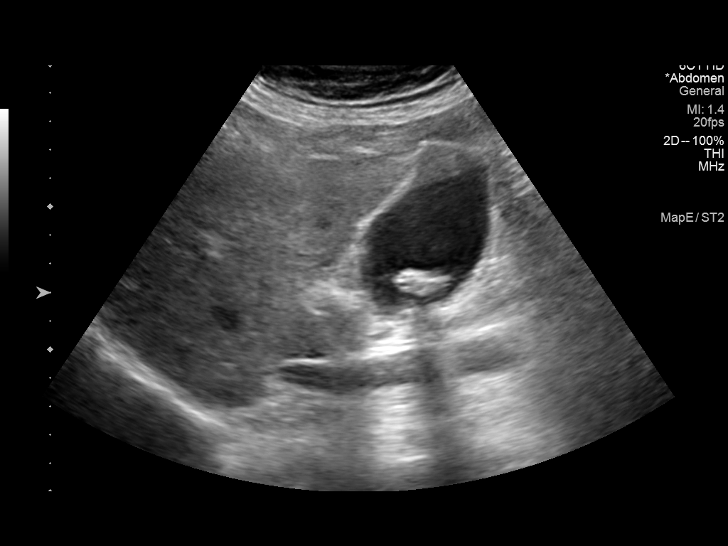

[14 of 25 positions shown; findings below may reference images not displayed]

FINDINGS: Gallbladder: There are multiple stones in the gallbladder. There is
no gallbladder wall thickening or pericholecystic fluid. Negative
sonographic Murphy's sign.

Common bile duct: Diameter: 4 mm

Liver: The liver demonstrates a heterogeneous echotexture and
slightly increased echogenicity. Portal vein is patent on color
Doppler imaging with normal direction of blood flow towards the
liver.

IVC: Suboptimally visualized.

Pancreas: Visualized portion unremarkable.

Spleen: Size and appearance within normal limits.

Right Kidney: Length: 9.5 cm. Mild parenchyma atrophy. No
hydronephrosis or shadowing stone

Left Kidney: Length: 10 cm. Mild parenchyma atrophy. No
hydronephrosis or shadowing stone.

Abdominal aorta: No aneurysm visualized.

Other findings: None.
IMPRESSION: 1. Cholelithiasis without sonographic evidence of acute
cholecystitis.
2. Probable fatty liver.

## 2019-11-05 ENCOUNTER — Telehealth: Payer: Self-pay

## 2019-11-05 DIAGNOSIS — I110 Hypertensive heart disease with heart failure: Secondary | ICD-10-CM

## 2019-11-05 DIAGNOSIS — G4733 Obstructive sleep apnea (adult) (pediatric): Secondary | ICD-10-CM

## 2019-11-05 DIAGNOSIS — I4891 Unspecified atrial fibrillation: Secondary | ICD-10-CM

## 2019-11-05 NOTE — Telephone Encounter (Signed)
Ok to  Refer    hypertensive heart disease,   A fib , OSA

## 2019-11-05 NOTE — Chronic Care Management (AMB) (Signed)
Chronic Care Management Pharmacy  Name: [name]  MRN: [id number] DOB: 06-27-1951  Initial Questions: 1. Have you seen any other providers since your last visit? No  2. Any changes in your medicines or health? No   Chief Complaint/ HPI [name],  69 y.o. , female presents for their Initial CCM visit with the clinical pharmacist via telephone due to COVID-19 Pandemic.  PCP : [name], MD  Their chronic conditions include: Afib, Pulmonary HTN, ASCVD (hx of stroke), CHF, HTN, Hypothyroidism,  HLD, Prediabetes, Gout, Osteoarthritis of spine/ muscle spasms.   Office Visits: 08/27/2019- Patient presented to Dr. [name], MD for osteoarthritis of spine. Patient to continue Tramadol at night. Referred to endocrinology for consult for alk phos and osteoporosis.   Consult Visit: 09/09/2019- Pulmonology- Patient presented to [name], NP for 62-monthfollow up for OSA/OHS, respiratory failure, diastolic HF, and pulmonary HTN.  Patient stable on CPAP and is compliant. Patient to continue on diuretics and Adcirca. Patient to follow up with Dr. OHermina Staggersor NP in 6 months and as needed.   05/30/2019- Ortho- Patient presented in office to Dr. RSuella Broadfor intervertebral disc degeneration.   05/28/2019- Cardiology- Patient presented to Dr. DLoralie Champagne MD for CHF follow up. Patient does not appear volume overloaded and weight is stable. Patient to continue torsemide and Diamox. Afib is rate controlled. Patient to continue atenolol and Xarelto. Patient to continue Adcirca 470mdaily and BiPAP for OSA.   Medications: Outpatient Encounter Medications as of 11/06/2019  Medication Sig  . acetaZOLAMIDE (DIAMOX) 250 MG tablet TAKE 1 TABLET BY MOUTH EVERY DAY  . allopurinol (ZYLOPRIM) 100 MG tablet TAKE 1 TABLET BY MOUTH TWICE A DAY  . atenolol (TENORMIN) 50 MG tablet Take 1 tablet (50 mg total) by mouth daily.  . Cholecalciferol (VITAMIN D) 50 MCG (2000 UT) CAPS Take 1 capsule  by mouth 4 (four) times a week.  . colchicine 0.6 MG tablet Take 1 tablet (0.6 mg total) by mouth daily as needed (flareups).  . cyanocobalamin 2000 MCG tablet Take 2,000 mcg by mouth 3 (three) times a week.  . diclofenac sodium (VOLTAREN) 1 % GEL Apply 2 g topically 2 (two) times daily as needed (for arthritis).   . Marland Kitchenevothyroxine (SYNTHROID) 75 MCG tablet TAKE 1 TABLET BY MOUTH EVERY DAY  . OMEGA-3 KRILL OIL PO Take 1 capsule by mouth 3 (three) times a week.  . rosuvastatin (CRESTOR) 5 MG tablet TAKE 1 TABLET BY MOUTH EVERY DAY  . tadalafil, PAH, (ALYQ) 20 MG tablet Take 2 tablets (40 mg total) by mouth daily.  . Marland Kitchenorsemide (DEMADEX) 20 MG tablet TAKE 2 TABLETS (40 MG TOTAL) BY MOUTH 2 (TWO) TIMES DAILY. (Patient taking differently: 20 mg. Patient reports taking 2 tablets in the morning and 1 tablet in the afternoon)  . traMADol (ULTRAM) 50 MG tablet Take 1 tablet (50 mg total) by mouth 3 (three) times daily as needed.  . Alveda Reasons0 MG TABS tablet TAKE 1 TABLET BY MOUTH EVERY DAY WITH SUPPER  . methocarbamol (ROBAXIN) 500 MG tablet TAKE 1/2 TABLET BY MOUTH AS NEEDED FOR MUSCLE SPASMS. (Patient not taking: Reported on 11/06/2019)  . potassium chloride (K-DUR) 10 MEQ tablet Take 2 tablets (20 mEq total) by mouth 2 (two) times daily.  . [DISCONTINUED] TOPAMAX 50 MG tablet Take 50 mg by mouth.   No facility-administered encounter medications on file as of 11/06/2019.     Current Diagnosis/Assessment:  Goals Addressed  This Visit's Progress   . Pharmacy Care Plan       Current Barriers:  . Chronic Disease Management support, education, and care coordination needs related to Atrial Fibrillation, CHF, HTN, HLD, and Pulmonary HTN, ASCVD (history of stroke), Hypothyroidsim, Prediabetes, Gout, Osteoarthritis of spine/ muscle spasms  Pharmacist Clinical Goal(s):  Marland Kitchen Continue self health monitoring activities as directed today  . Prediabetes:  o Maintain A1c <6.5%.  o Continue with diet  and exercise lifestyle modifications.  . Heart failure:  o Check weight daily and contact physician if weight gain of more than 3 pounds overnight or more than 5 pounds in a week.  . Blood pressure:  o Maintain blood pressure: <130/80 mmHg.  . High cholesterol . Cholesterol goals: Total Cholesterol goal under 200, Triglycerides goal under 150, HDL goal above 40 (men) or above 50 (women), LDL goal under 100.  . Osteoarthritis of spine/ muscle spasms o Continue to see improvement in pain level.  . Gout o Prevent gout flares and pain. Marland Kitchen Hypothyroidism . Maintain TSH between 0.45 to 4.5uIU/ml  Interventions: . Comprehensive medication review performed. . Assessed patient understanding of disease states . Assessed patient's education and care coordination needs.  . Lifestyle modifications . Engaging in at least 150 minutes per week of moderate-intensity exercise such as brisk walking (15- to 20-minute mile) or something similar. Recommended they incorporate flexibility, balance, and some type of strength training exercises.  . Prediabetes:  o Focus on managing/monitoring CVD risk factors, including keeping blood pressure and lipids under goal. We discussed modifying lifestyle, including to participate in moderate physical activity (e.g., walking) at least 150 minutes per week. Discussed a Mediterranean eating plan with an emphasis on whole grains, legumes, nuts, fruits, and vegetables and minimal refined and processed foods.  . Heart failure:  Marland Kitchen Maintaining a low sodium diet with goal of less than 1544m per day.  . Weighing daily, if you gain more than 3 pounds in one day or 5 pounds in one week call your doctor.  . Continue: acetazolamide 2556m 1 tablet daily, torsemide 2079m2 tablets in the morning and 1 tablet in the afternoon, potassium chloride 36m20m2 tablets twice daily.  . Blood pressure:  o Continue: atenolol 50mg45mtablet daily . High cholesterol . Continue rosuvastatin 5mg, 36mtablet daily  . Osteoarthritis of spine/ muscle spasms o Continue diclofenac 1% gel, 2 g two times daily as needed for arthritis, - methocarbamol 500mg, 64mtablets as needed for muscle spasms, tramadol 50mg, 172mlet three times daily as needed . Gout o Continue allopurinol 100mg, 1 15met twice daily and colchicine 0.6mg, 1 ta21mt as needed for flare ups.  . Afib o Discussed monitoring for signs and symptoms for bleeding (coughing up blood, prolonged nose bleeds, black, tarry stools.  o Continue atenolol 50mg, 1t a7m daily and Xarelto 20mg, 1 tab67mevery day with supper.  . Pulmonary hypertension o Continue tadalafil (Adcirca) 20mg, 2 tabl53monce daily.  . Hypothyroidism . Continue levothyroxine 75mcg, 1 tabl61maily   Patient Self Care Activities:  . Calls provider office for new concerns or questions . Continue following up with specialists. . Continue at home blood pressure readings. . Continue with lifestyle modifications (diet/ exercise).   Initial goal documentation        AFIB  Patient is currently rate controlled. HR: 65-70 BPM   Patient has failed these meds in past: none  Patient is currently controlled on the following medications:  - atenolol 50mg,57m  1 tablet daily    Anticoagulation - Xarelto 62m, 1 tablet every day with supper  We discussed:  Discussed monitoring for signs and symptoms for bleeding (coughing up blood, prolonged nose bleeds, black, tarry stools)  Plan Patient denies dizziness/ orthostatic hypotension.  Managed by cardiologist.  Continue current medications.   Prediabetes   Recent Relevant Labs: Lab Results  Component Value Date/Time   HGBA1C 5.8 01/30/2019 09:38 AM   HGBA1C 5.5 07/09/2018 10:57 AM   MICROALBUR 0.2 07/09/2009 08:29 AM    Patient has failed these meds in past: none  Patient is currently controlled on the following medications: - diet controlled   We discussed: diet and exercise extensively.  - patient notes  diet consists of vegetables and chicken.  - patient plans to start going [facility] with plans of doing 15-20 minutes walk (1/4 mile in gym) for 3x/ week.  Plan Continue control with diet and exercise.    Heart Failure  Type: Diastolic  Last ejection fraction: 03/2019 60-65%  NYHA Class: I (no actitivty limitation)  Patient has failed these meds in past: furosemide  Patient is currently controlled on the following medications:  - acetazolamide 2578m 1 tablet daily  - torsemide 2025m2 tablets twice daily (2 tablets in the AM, 1 in the afternoon).  - potassium chloride 19m7m2 tablets twice daily   Potassium: 4.8 (10/04/2019)  We discussed weighing daily; if you gain more than 3 pounds in one day or 5 pounds in one week call your doctor and sodium intake reduction (patient notes not using salt at all).   - Patient notes weight flucuates minimally (at most 1 pound difference) and checks ankles every morning. Current presents with no swelling, but does endorse slight swelling in the evening.  Plan Managed by cardiologist.  Continue current medications.  ,  Hypertension  BP today is:  <130/80  Office blood pressures are  BP Readings from Last 3 Encounters:  09/19/18 140/72  08/15/18 104/64  07/09/18 116/68   Patient has failed these meds in the past: lisinopril (dropping BP too low).   Patient checks BP at home daily  Patient home BP readings are ranging:  110-120/70-75 mmHg   Patient is controlled on:  - atenolol 50mg57mtablet daily  We discussed diet and exercise extensively  - diet: Patient notes diet consists of vegetables. She avoids red meat and meals are mostly with chicken.  - exercise: in last 2 months has not been going to gym due to [place] plans to restart exercise routine (30 minutes on steps and 15-20 minute walk (1/4 mile) 3 times per week.   Plan  Denies dizziness/lightheadedness/orthostatic hypotension. Managed by cardiologist.    Continue current medications and control with diet and exercise.    Hypothyroidism   TSH  Date Value Ref Range Status  01/30/2019 2.32 0.35 - 4.50 uIU/mL Final    No components found for: T4  Patient has failed these meds in past: none  Patient is currently controlled on the following medications:  - levothyroxine 75mcg39mtablet daily   Plan Continue current medications.   Hyperlipidemia/ Secondary prevention ASCVD   Lipid Panel     Component Value Date/Time   CHOL 154 01/30/2019 0938   TRIG 124.0 01/30/2019 0938   HDL 55.70 01/30/2019 0938   CHOLHDL 3 01/30/2019 0938   VLDL 24.8 01/30/2019 0938   LDLCALC 73 01/30/2019 0938   LDLDIRECT 139.4 06/30/2010 0947    Patient has  failed these meds in past: simvastatin Patient is currently controlled on the following medications:  - rosuvastatin 5 mg, 1 tablet daily   We discussed:  diet and exercise extensively.  -  We discussed how a diet high in plant sterols (fruits/vegetables/nuts/whole grains/legumes) may reduce your cholesterol.   - Encouraged increasing fiber to a daily intake of 10-25g/day  - Discussed with patient, importance of statin therapy for secondary prevention of ASCVD.   Plan Could consider increasing to high intensity statin due to ASCVD.  Continue current medications.   Gout   Patient has failed these meds in past: none  Patient is currently controlled on the following medications:  - allopurinol 136m, 1 tablet twice daily  - colchicine 0.666m 1 tablet as needed for flare ups.   Uric acid: 4.1 (01/30/2019)   We discussed: Eating a low-purine diet. Avoid foods and drinks such as: liver, kidney, anchovies, asparagus, herring, mushrooms, mussels, beer,etc.   - patient stated having a 1 time gout flare up on her finger  Plan Continue current medications.   Pulmonary HTN   Patient has failed these meds in past: none  Patient is currently controlled on the following medications:  - tadalafil  (Adcirca) 2077m2 tablets once daily   Plan Managed by pulmonologist.  Continue current medications.   Osteoarthritis of spine/ muscle spasms   Patient has failed these meds in past: none  Patient is currently controlled on the following medications:  - diclofenac 1% gel, 2 g two times daily as needed for arthritis (patient notes using very often)  - methocarbamol 500m8m.5 tablets as needed for muscle spasms (patient notes not using, but likes to keep on hand). - tramadol 50mg29mtablet three times daily as needed (patient notes only takes at night if needed. Last dose was over a week ago)   We discussed: - discussed using Tylenol (patient notes past use she had no relief) - discussed use of tramadol in older adults(patient notes only taking at night if needed)  - patient was advised to avoid NSAIDs (concern with CHF). Patient aware of concerns and states she does not use Aleve anymore.   Plan Continue current medications.   Medication Management   Patient organized medications: in pill box for 2 weeks.  Patient denies barriers obtaining medications.   Patient has grant for tadalafil and obtains through [facility].    Follow up Follow up visit with PharmD in 6 months. Will conduct general telephone calls for periodic check-ins before next visit.     AnnetAnson CroftsrmD Clinical Pharmacist [facility])[phone]

## 2019-11-05 NOTE — Telephone Encounter (Signed)
I am requesting an ambulatory referral to CCM be placed for this patient. Referral will need to have 2 current diagnosis attached to it. Thank you!

## 2019-11-05 NOTE — Telephone Encounter (Signed)
Please advise

## 2019-11-06 ENCOUNTER — Other Ambulatory Visit: Payer: Self-pay

## 2019-11-06 ENCOUNTER — Telehealth: Payer: Self-pay | Admitting: Internal Medicine

## 2019-11-06 ENCOUNTER — Ambulatory Visit: Payer: PPO

## 2019-11-06 DIAGNOSIS — I482 Chronic atrial fibrillation, unspecified: Secondary | ICD-10-CM

## 2019-11-06 DIAGNOSIS — I10 Essential (primary) hypertension: Secondary | ICD-10-CM

## 2019-11-06 NOTE — Telephone Encounter (Signed)
Handicap Placard form to be filled out-- placed in dr's folder.  Call [phone] upon completion.

## 2019-11-06 NOTE — Telephone Encounter (Signed)
Referral has been placed. 

## 2019-11-06 NOTE — Addendum Note (Signed)
Addended by: Modena Morrow R on: 11/06/2019 08:17 AM   Modules accepted: Orders

## 2019-11-06 NOTE — Telephone Encounter (Signed)
Pt has been notified form has been completed and placed up front

## 2019-11-06 NOTE — Telephone Encounter (Signed)
Placed in red folder

## 2019-11-06 NOTE — Telephone Encounter (Signed)
Form completed on your desk

## 2019-11-07 NOTE — Patient Instructions (Addendum)
Visit Information  Goals Addressed            This Visit's Progress   . Pharmacy Care Plan       Current Barriers:  . Chronic Disease Management support, education, and care coordination needs related to Atrial Fibrillation, CHF, HTN, HLD, and Pulmonary HTN, ASCVD (history of stroke), Hypothyroidsim, Prediabetes, Gout, Osteoarthritis of spine/ muscle spasms  Pharmacist Clinical Goal(s):  Marland Kitchen Continue self health monitoring activities as directed today  . Prediabetes:  o Maintain A1c <6.5%.  o Continue with diet and exercise lifestyle modifications.  . Heart failure:  o Check weight daily and contact physician if weight gain of more than 3 pounds overnight or more than 5 pounds in a week.  . Blood pressure:  o Maintain blood pressure: <130/80 mmHg.  . High cholesterol . Cholesterol goals: Total Cholesterol goal under 200, Triglycerides goal under 150, HDL goal above 40 (men) or above 50 (women), LDL goal under 100.  . Osteoarthritis of spine/ muscle spasms o Continue to see improvement in pain level.  . Gout o Prevent gout flares and pain. Marland Kitchen Hypothyroidism . Maintain TSH between 0.45 to 4.5uIU/ml  Interventions: . Comprehensive medication review performed. . Assessed patient understanding of disease states . Assessed patient's education and care coordination needs.  . Lifestyle modifications . Engaging in at least 150 minutes per week of moderate-intensity exercise such as brisk walking (15- to 20-minute mile) or something similar. Recommended they incorporate flexibility, balance, and some type of strength training exercises.  . Prediabetes:  o Focus on managing/monitoring CVD risk factors, including keeping blood pressure and lipids under goal. We discussed modifying lifestyle, including to participate in moderate physical activity (e.g., walking) at least 150 minutes per week. Discussed a Mediterranean eating plan with an emphasis on whole grains, legumes, nuts, fruits, and  vegetables and minimal refined and processed foods.  . Heart failure:  Marland Kitchen Maintaining a low sodium diet with goal of less than 1544m per day.  . Weighing daily, if you gain more than 3 pounds in one day or 5 pounds in one week call your doctor.  . Continue: acetazolamide 2561m 1 tablet daily, torsemide 2096m2 tablets in the morning and 1 tablet in the afternoon, potassium chloride 57m20m2 tablets twice daily.  . Blood pressure:  o Continue: atenolol 50mg41mtablet daily . High cholesterol . Continue rosuvastatin 5mg, 79mablet daily  . Osteoarthritis of spine/ muscle spasms o Continue diclofenac 1% gel, 2 g two times daily as needed for arthritis, - methocarbamol 500mg, 68mtablets as needed for muscle spasms, tramadol 50mg, 137mlet three times daily as needed . Gout o Continue allopurinol 100mg, 1 89met twice daily and colchicine 0.6mg, 1 ta26mt as needed for flare ups.  . Afib o Discussed monitoring for signs and symptoms for bleeding (coughing up blood, prolonged nose bleeds, black, tarry stools.  o Continue atenolol 50mg, 1t a14m daily and Xarelto 20mg, 1 tab72mevery day with supper.  . Pulmonary hypertension o Continue tadalafil (Adcirca) 20mg, 2 tabl51monce daily.  . Hypothyroidism . Continue levothyroxine 75mcg, 1 tabl93maily   Patient Self Care Activities:  . Calls provider office for new concerns or questions . Continue following up with specialists. . Continue at home blood pressure readings. . Continue with lifestyle modifications (diet/ exercise).   Initial goal documentation        [name] was giveDohnormation about Chronic Care Management services today including:  1. CCM service includes personalized  support from designated clinical staff supervised by her physician, including individualized plan of care and coordination with other care providers 2. 24/7 contact phone numbers for assistance for urgent and routine care needs. 3. Service will only be billed  when office clinical staff spend 20 minutes or more in a month to coordinate care. 4. Only one practitioner may furnish and bill the service in a calendar month. 5. The patient may stop CCM services at any time (effective at the end of the month) by phone call to the office staff.  Patient agreed to services and verbal consent obtained.   The patient verbalized understanding of instructions provided today and agreed to receive a mailed copy of patient instruction and/or educational materials. Telephone follow up appointment with pharmacy team member scheduled for: 05/08/2020.   Anson Crofts, PharmD Clinical Pharmacist [facility] [place] [phone]    [place] stands for "Dietary Approaches to Stop Hypertension." The DASH eating plan is a healthy eating plan that has been shown to reduce high blood pressure (hypertension). It may also reduce your risk for type 2 diabetes, heart disease, and stroke. The DASH eating plan may also help with weight loss. What are tips for following this plan?  General guidelines  Avoid eating more than 2,300 mg (milligrams) of salt (sodium) a day. If you have hypertension, you may need to reduce your sodium intake to 1,500 mg a day.  Limit alcohol intake to no more than 1 drink a day for nonpregnant women and 2 drinks a day for men. One drink equals 12 oz of beer, 5 oz of wine, or 1 oz of hard liquor.  Work with your health care provider to maintain a healthy body weight or to lose weight. Ask what an ideal weight is for you.  Get at least 30 minutes of exercise that causes your heart to beat faster (aerobic exercise) most days of the week. Activities may include walking, swimming, or biking.  Work with your health care provider or diet and nutrition specialist (dietitian) to adjust your eating plan to your individual calorie needs. Reading food labels   Check food labels for the amount of sodium per serving.  Choose foods with less than 5 percent of the Daily Value of sodium. Generally, foods with less than 300 mg of sodium per serving fit into this eating plan.  To find whole grains, look for the word "whole" as the first word in the ingredient list. Shopping  Buy products labeled as "low-sodium" or "no salt added."  Buy fresh foods. Avoid canned foods and premade or frozen meals. Cooking  Avoid adding salt when cooking. Use salt-free seasonings or herbs instead of table salt or sea salt. Check with your health care provider or pharmacist before using salt substitutes.  Do not fry foods. Cook foods using healthy methods such as baking, boiling, grilling, and broiling instead.  Cook with heart-healthy oils, such as olive, canola, soybean, or sunflower oil. Meal planning  Eat a balanced diet that includes: ? 5 or more servings of fruits and vegetables each day. At each meal, try to fill half of your plate with fruits and vegetables. ? Up to 6-8 servings of whole grains each day. ? Less than 6 oz of lean meat, poultry, or fish each day. A 3-oz serving of meat is about the same size as a deck of cards. One egg equals 1 oz. ? 2 servings of low-fat dairy each day. ? A serving  of nuts, seeds, or beans 5 times each week. ? Heart-healthy fats. Healthy fats called Omega-3 fatty acids are found in foods such as flaxseeds and coldwater fish, like sardines, salmon, and mackerel.  Limit how much you eat of the following: ? Canned or prepackaged foods. ? Food that is high in trans fat, such as fried foods. ? Food that is high in saturated fat, such as fatty meat. ? Sweets, desserts, sugary drinks, and other foods with added sugar. ? Full-fat dairy products.  Do not salt foods before eating.  Try to eat at least 2 vegetarian meals each week.  Eat more home-cooked food and less restaurant, buffet, and fast food.  When eating at a restaurant, ask that your food be prepared with less salt or no salt,  if possible. What foods are recommended? The items listed may not be a complete list. Talk with your dietitian about what dietary choices are best for you. Grains Whole-grain or whole-wheat bread. Whole-grain or whole-wheat pasta. Brown rice. [place]. Bulgur. Whole-grain and low-sodium cereals. Pita bread. Low-fat, low-sodium crackers. Whole-wheat flour tortillas. Vegetables Fresh or frozen vegetables (raw, steamed, roasted, or grilled). Low-sodium or reduced-sodium tomato and vegetable juice. Low-sodium or reduced-sodium tomato sauce and tomato paste. Low-sodium or reduced-sodium canned vegetables. Fruits All fresh, dried, or frozen fruit. Canned fruit in natural juice (without added sugar). Meat and other protein foods Skinless chicken or [place]. Ground chicken or [place]. Pork with fat trimmed off. Fish and seafood. Egg whites. Dried beans, peas, or lentils. Unsalted nuts, nut butters, and seeds. Unsalted canned beans. Lean cuts of beef with fat trimmed off. Low-sodium, lean deli meat. Dairy Low-fat (1%) or fat-free (skim) milk. Fat-free, low-fat, or reduced-fat cheeses. Nonfat, low-sodium ricotta or cottage cheese. Low-fat or nonfat yogurt. Low-fat, low-sodium cheese. Fats and oils Soft margarine without trans fats. Vegetable oil. Low-fat, reduced-fat, or light mayonnaise and salad dressings (reduced-sodium). Canola, safflower, olive, soybean, and sunflower oils. Avocado. Seasoning and other foods Herbs. Spices. Seasoning mixes without salt. Unsalted popcorn and pretzels. Fat-free sweets. What foods are not recommended? The items listed may not be a complete list. Talk with your dietitian about what dietary choices are best for you. Grains Baked goods made with fat, such as croissants, muffins, or some breads. Dry pasta or rice meal packs. Vegetables Creamed or fried vegetables. Vegetables in a cheese sauce. Regular canned vegetables (not low-sodium or reduced-sodium). Regular canned  tomato sauce and paste (not low-sodium or reduced-sodium). Regular tomato and vegetable juice (not low-sodium or reduced-sodium). Angie Fava. Olives. Fruits Canned fruit in a light or heavy syrup. Fried fruit. Fruit in cream or butter sauce. Meat and other protein foods Fatty cuts of meat. Ribs. Fried meat. Berniece Salines. Sausage. Bologna and other processed lunch meats. Salami. Fatback. Hotdogs. Bratwurst. Salted nuts and seeds. Canned beans with added salt. Canned or smoked fish. Whole eggs or egg yolks. Chicken or [place] with skin. Dairy Whole or 2% milk, cream, and half-and-half. Whole or full-fat cream cheese. Whole-fat or sweetened yogurt. Full-fat cheese. Nondairy creamers. Whipped toppings. Processed cheese and cheese spreads. Fats and oils Butter. Stick margarine. Lard. Shortening. Ghee. Bacon fat. Tropical oils, such as coconut, palm kernel, or palm oil. Seasoning and other foods Salted popcorn and pretzels. Onion salt, garlic salt, seasoned salt, table salt, and sea salt. Worcestershire sauce. Tartar sauce. Barbecue sauce. Teriyaki sauce. Soy sauce, including reduced-sodium. Steak sauce. Canned and packaged gravies. Fish sauce. Oyster sauce. Cocktail sauce. Horseradish that you find on the shelf. Ketchup. Mustard. Meat  flavorings and tenderizers. Bouillon cubes. Hot sauce and Tabasco sauce. Premade or packaged marinades. Premade or packaged taco seasonings. Relishes. Regular salad dressings. Where to find more information:  National Heart, Lung, and [place]: https://wilson-eaton.com/  American Heart Association: www.heart.org Summary  The DASH eating plan is a healthy eating plan that has been shown to reduce high blood pressure (hypertension). It may also reduce your risk for type 2 diabetes, heart disease, and stroke.  With the DASH eating plan, you should limit salt (sodium) intake to 2,300 mg a day. If you have hypertension, you may need to reduce your sodium intake to 1,500 mg a day.  When  on the DASH eating plan, aim to eat more fresh fruits and vegetables, whole grains, lean proteins, low-fat dairy, and heart-healthy fats.  Work with your health care provider or diet and nutrition specialist (dietitian) to adjust your eating plan to your individual calorie needs. This information is not intended to replace advice given to you by your health care provider. Make sure you discuss any questions you have with your health care provider. Document Revised: 08/04/2017 Document Reviewed: 08/15/2016 Elsevier Patient Education  [place].  Heart Failure, Self Care Heart failure is a serious condition. This sheet explains things you need to do to take care of yourself at home. To help you stay as healthy as possible, you may be asked to change your diet, take certain medicines, and make other changes in your life. Your doctor may also give you more specific instructions. If you have problems or questions, call your doctor. What are the risks? Having heart failure makes it more likely for you to have some problems. These problems can get worse if you do not take good care of yourself. Problems may include:  Blood clotting problems. This may cause a stroke.  Damage to the kidneys, liver, or lungs.  Abnormal heart rhythms. Supplies needed:  Scale for weighing yourself.  Blood pressure monitor.  Notebook.  Medicines. How to care for yourself when you have heart failure Medicines Take over-the-counter and prescription medicines only as told by your doctor. Take your medicines every day.  Do not stop taking your medicine unless your doctor tells you to do so.  Do not skip any medicines.  Get your prescriptions refilled before you run out of medicine. This is important. Eating and drinking   Eat heart-healthy foods. Talk with a diet specialist (dietitian) to create an eating plan.  Choose foods that: ? Have no trans fat. ? Are low in saturated fat and  cholesterol.  Choose healthy foods, such as: ? Fresh or frozen fruits and vegetables. ? Fish. ? Low-fat (lean) meats. ? Legumes, such as beans, peas, and lentils. ? Fat-free or low-fat dairy products. ? Whole-grain foods. ? High-fiber foods.  Limit salt (sodium) if told by your doctor. Ask your diet specialist to tell you which seasonings are healthy for your heart.  Cook in healthy ways instead of frying. Healthy ways of cooking include roasting, grilling, broiling, baking, poaching, steaming, and stir-frying.  Limit how much fluid you drink, if told by your doctor. Alcohol use  Do not drink alcohol if: ? Your doctor tells you not to drink. ? Your heart was damaged by alcohol, or you have very bad heart failure. ? You are pregnant, may be pregnant, or are planning to become pregnant.  If you drink alcohol: ? Limit how much you use to:  0-1 drink a day for women.  0-2 drinks a  day for men. ? Be aware of how much alcohol is in your drink. In the [place] one drink equals one 12 oz bottle of beer (355 mL), one 5 oz glass of wine (148 mL), or one 1 oz glass of hard liquor (44 mL). Lifestyle   Do not use any products that contain nicotine or tobacco, such as cigarettes, e-cigarettes, and chewing tobacco. If you need help quitting, ask your doctor. ? Do not use nicotine gum or patches before talking to your doctor.  Do not use illegal drugs.  Lose weight if told by your doctor.  Do physical activity if told by your doctor. Talk to your doctor before you begin an exercise if: ? You are an older adult. ? You have very bad heart failure.  Learn to manage stress. If you need help, ask your doctor.  Get rehab (rehabilitation) to help you stay independent and to help with your quality of life.  Plan time to rest when you get tired. Check weight and blood pressure   Weigh yourself every day. This will help you to know if fluid is building up in your body. ? Weigh yourself every  morning after you pee (urinate) and before you eat breakfast. ? Wear the same amount of clothing each time. ? Write down your daily weight. Give your record to your doctor.  Check and write down your blood pressure as told by your doctor.  Check your pulse as told by your doctor. Dealing with very hot and very cold weather  If it is very hot: ? Avoid activities that take a lot of energy. ? Use air conditioning or fans, or find a cooler place. ? Avoid caffeine and alcohol. ? Wear clothing that is loose-fitting, lightweight, and light-colored.  If it is very cold: ? Avoid activities that take a lot of energy. ? Layer your clothes. ? Wear mittens or gloves, a hat, and a scarf when you go outside. ? Avoid alcohol. Follow these instructions at home:  Stay up to date with shots (vaccines). Get pneumococcal and flu (influenza) shots.  Keep all follow-up visits as told by your doctor. This is important. Contact a doctor if:  You gain weight quickly.  You have increasing shortness of breath.  You cannot do your normal activities.  You get tired easily.  You cough a lot.  You don't feel like eating or feel like you may vomit (nauseous).  You become puffy (swell) in your hands, feet, ankles, or belly (abdomen).  You cannot sleep well because it is hard to breathe.  You feel like your heart is beating fast (palpitations).  You get dizzy when you stand up. Get help right away if:  You have trouble breathing.  You or someone else notices a change in your behavior, such as having trouble staying awake.  You have chest pain or discomfort.  You pass out (faint). These symptoms may be an emergency. Do not wait to see if the symptoms will go away. Get medical help right away. Call your local emergency services (911 in the [place] Do not drive yourself to the hospital. Summary  Heart failure is a serious condition. To care for yourself, you may have to change your diet, take  medicines, and make other lifestyle changes.  Take your medicines every day. Do not stop taking them unless your doctor tells you to do so.  Eat heart-healthy foods, such as fresh or frozen fruits and vegetables, fish, lean meats, legumes, fat-free or low-fat  dairy products, and whole-grain or high-fiber foods.  Ask your doctor if you can drink alcohol. You may have to stop alcohol use if you have very bad heart failure.  Contact your doctor if you gain weight quickly or feel that your heart is beating too fast. Get help right away if you pass out, or have chest pain or trouble breathing. This information is not intended to replace advice given to you by your health care provider. Make sure you discuss any questions you have with your health care provider. Document Revised: 12/04/2018 Document Reviewed: 12/05/2018 Elsevier Patient Education  [place].

## 2019-12-07 ENCOUNTER — Other Ambulatory Visit (HOSPITAL_COMMUNITY): Payer: Self-pay | Admitting: Cardiology

## 2019-12-12 DIAGNOSIS — G4733 Obstructive sleep apnea (adult) (pediatric): Secondary | ICD-10-CM | POA: Diagnosis not present

## 2019-12-12 DIAGNOSIS — J9610 Chronic respiratory failure, unspecified whether with hypoxia or hypercapnia: Secondary | ICD-10-CM | POA: Diagnosis not present

## 2019-12-23 ENCOUNTER — Other Ambulatory Visit: Payer: Self-pay | Admitting: Internal Medicine

## 2019-12-23 DIAGNOSIS — G4733 Obstructive sleep apnea (adult) (pediatric): Secondary | ICD-10-CM | POA: Diagnosis not present

## 2019-12-23 DIAGNOSIS — J9610 Chronic respiratory failure, unspecified whether with hypoxia or hypercapnia: Secondary | ICD-10-CM | POA: Diagnosis not present

## 2020-01-02 ENCOUNTER — Other Ambulatory Visit (HOSPITAL_COMMUNITY): Payer: Self-pay | Admitting: Cardiology

## 2020-01-08 ENCOUNTER — Other Ambulatory Visit (HOSPITAL_COMMUNITY): Payer: Self-pay | Admitting: Cardiology

## 2020-01-14 ENCOUNTER — Other Ambulatory Visit: Payer: Self-pay | Admitting: Internal Medicine

## 2020-01-26 ENCOUNTER — Other Ambulatory Visit (HOSPITAL_COMMUNITY): Payer: Self-pay | Admitting: Cardiology

## 2020-01-27 ENCOUNTER — Other Ambulatory Visit (HOSPITAL_COMMUNITY): Payer: Self-pay | Admitting: Cardiology

## 2020-01-28 NOTE — Telephone Encounter (Signed)
Meds ordered this encounter  Medications  . potassium chloride (KLOR-CON) 10 MEQ tablet    Sig: Take 2 tablets (20 mEq total) by mouth 2 (two) times daily. Must be seen for further refills    Dispense:  120 tablet    Refill:  0

## 2020-01-29 ENCOUNTER — Other Ambulatory Visit: Payer: Self-pay | Admitting: Internal Medicine

## 2020-01-31 ENCOUNTER — Telehealth: Payer: Self-pay | Admitting: Internal Medicine

## 2020-01-31 NOTE — Telephone Encounter (Signed)
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   Any questions give me a call [phone]

## 2020-01-31 NOTE — Telephone Encounter (Signed)
Spoke to pt and said, she will call the office on 02/04/20 to schedule an appt because she was driving at the time I called her.

## 2020-02-14 ENCOUNTER — Ambulatory Visit: Payer: PPO | Admitting: Internal Medicine

## 2020-02-21 ENCOUNTER — Other Ambulatory Visit (HOSPITAL_COMMUNITY): Payer: Self-pay | Admitting: Cardiology

## 2020-02-26 ENCOUNTER — Other Ambulatory Visit: Payer: Self-pay

## 2020-02-26 ENCOUNTER — Other Ambulatory Visit (INDEPENDENT_AMBULATORY_CARE_PROVIDER_SITE_OTHER): Payer: PPO

## 2020-02-26 ENCOUNTER — Telehealth: Payer: Self-pay | Admitting: Internal Medicine

## 2020-02-26 DIAGNOSIS — N390 Urinary tract infection, site not specified: Secondary | ICD-10-CM | POA: Diagnosis not present

## 2020-02-26 DIAGNOSIS — R350 Frequency of micturition: Secondary | ICD-10-CM | POA: Diagnosis not present

## 2020-02-26 DIAGNOSIS — R319 Hematuria, unspecified: Secondary | ICD-10-CM

## 2020-02-26 LAB — POCT URINALYSIS DIPSTICK
Glucose, UA: POSITIVE — AB
Nitrite, UA: POSITIVE
Protein, UA: POSITIVE — AB
Spec Grav, UA: 1.015 (ref 1.010–1.025)
Urobilinogen, UA: 4 E.U./dL — AB
pH, UA: 7.5 (ref 5.0–8.0)

## 2020-02-26 MED ORDER — CEFDINIR 300 MG PO CAPS: 300.0000 mg | ORAL_CAPSULE | Freq: Two times a day (BID) | ORAL | 0 refills | Status: AC

## 2020-02-26 NOTE — Telephone Encounter (Signed)
Called patient and gave her lab results and sent in antibiotic. Patient verbalized an understanding.

## 2020-02-26 NOTE — Addendum Note (Signed)
Addended by: Marrion Coy on: 02/26/2020 11:02 AM   Modules accepted: Orders

## 2020-02-26 NOTE — Telephone Encounter (Signed)
Pt stated she is anxious to get her results from her urine test. Informed pt that as soon as the doctor reviews them we will reach out to her.

## 2020-02-26 NOTE — Progress Notes (Signed)
Urine looks infected  if uncomfortable  can begin antibiotic  Omnicef 300 mg 1 po bid for 5 days disp 10  # Culture pending a nd we can discuss at your Friday visit

## 2020-02-26 NOTE — Telephone Encounter (Signed)
Please see message.

## 2020-02-26 NOTE — Telephone Encounter (Signed)
Please see result note   And send in antibiotic

## 2020-02-27 ENCOUNTER — Other Ambulatory Visit: Payer: Self-pay

## 2020-02-28 ENCOUNTER — Encounter: Payer: Self-pay | Admitting: Internal Medicine

## 2020-02-28 ENCOUNTER — Ambulatory Visit (INDEPENDENT_AMBULATORY_CARE_PROVIDER_SITE_OTHER): Payer: PPO | Admitting: Internal Medicine

## 2020-02-28 ENCOUNTER — Telehealth: Payer: Self-pay

## 2020-02-28 VITALS — BP 116/74 | HR 66 | Temp 97.7°F | Wt 254.8 lb

## 2020-02-28 DIAGNOSIS — N3000 Acute cystitis without hematuria: Secondary | ICD-10-CM | POA: Diagnosis not present

## 2020-02-28 DIAGNOSIS — I110 Hypertensive heart disease with heart failure: Secondary | ICD-10-CM

## 2020-02-28 DIAGNOSIS — B372 Candidiasis of skin and nail: Secondary | ICD-10-CM | POA: Diagnosis not present

## 2020-02-28 DIAGNOSIS — E039 Hypothyroidism, unspecified: Secondary | ICD-10-CM

## 2020-02-28 DIAGNOSIS — R3915 Urgency of urination: Secondary | ICD-10-CM

## 2020-02-28 DIAGNOSIS — E785 Hyperlipidemia, unspecified: Secondary | ICD-10-CM

## 2020-02-28 DIAGNOSIS — I482 Chronic atrial fibrillation, unspecified: Secondary | ICD-10-CM | POA: Diagnosis not present

## 2020-02-28 DIAGNOSIS — Z7901 Long term (current) use of anticoagulants: Secondary | ICD-10-CM

## 2020-02-28 DIAGNOSIS — Z8739 Personal history of other diseases of the musculoskeletal system and connective tissue: Secondary | ICD-10-CM

## 2020-02-28 DIAGNOSIS — E538 Deficiency of other specified B group vitamins: Secondary | ICD-10-CM

## 2020-02-28 DIAGNOSIS — Z79899 Other long term (current) drug therapy: Secondary | ICD-10-CM

## 2020-02-28 LAB — BASIC METABOLIC PANEL
BUN: 19 mg/dL (ref 6–23)
CO2: 25 mEq/L (ref 19–32)
Calcium: 9.2 mg/dL (ref 8.4–10.5)
Chloride: 108 mEq/L (ref 96–112)
Creatinine, Ser: 1.19 mg/dL (ref 0.40–1.20)
GFR: 44.78 mL/min — ABNORMAL LOW (ref >60.00)
Glucose, Bld: 97 mg/dL (ref 70–99)
Potassium: 4.7 mEq/L (ref 3.5–5.1)
Sodium: 143 mEq/L (ref 135–145)

## 2020-02-28 LAB — LIPID PANEL
Cholesterol: 146 mg/dL (ref 0–200)
HDL: 54.9 mg/dL (ref >39.00)
LDL Cholesterol: 71 mg/dL (ref 0–99)
NonHDL: 91.54
Total CHOL/HDL Ratio: 3
Triglycerides: 104 mg/dL (ref 0.0–149.0)
VLDL: 20.8 mg/dL (ref 0.0–40.0)

## 2020-02-28 LAB — CBC WITH DIFFERENTIAL/PLATELET
Basophils Absolute: 0 10*3/uL (ref 0.0–0.1)
Basophils Relative: 0.6 % (ref 0.0–3.0)
Eosinophils Absolute: 0.2 10*3/uL (ref 0.0–0.7)
Eosinophils Relative: 3.3 % (ref 0.0–5.0)
HCT: 40.8 % (ref 36.0–46.0)
Hemoglobin: 13.7 g/dL (ref 12.0–15.0)
Lymphocytes Relative: 13.3 % (ref 12.0–46.0)
Lymphs Abs: 0.9 10*3/uL (ref 0.7–4.0)
MCHC: 33.5 g/dL (ref 30.0–36.0)
MCV: 99.2 fl (ref 78.0–100.0)
Monocytes Absolute: 0.4 10*3/uL (ref 0.1–1.0)
Monocytes Relative: 5.8 % (ref 3.0–12.0)
Neutro Abs: 5.2 10*3/uL (ref 1.4–7.7)
Neutrophils Relative %: 77 % (ref 43.0–77.0)
Platelets: 133 10*3/uL — ABNORMAL LOW (ref 150.0–400.0)
RBC: 4.11 Mil/uL (ref 3.87–5.11)
RDW: 15.5 % (ref 11.5–15.5)
WBC: 6.7 10*3/uL (ref 4.0–10.5)

## 2020-02-28 LAB — URINE CULTURE
MICRO NUMBER:: 10625277
SPECIMEN QUALITY:: ADEQUATE

## 2020-02-28 LAB — HEPATIC FUNCTION PANEL
ALT: 7 U/L (ref 0–35)
AST: 18 U/L (ref 0–37)
Albumin: 4.3 g/dL (ref 3.5–5.2)
Alkaline Phosphatase: 118 U/L — ABNORMAL HIGH (ref 39–117)
Bilirubin, Direct: 0.2 mg/dL (ref 0.0–0.3)
Total Bilirubin: 1 mg/dL (ref 0.2–1.2)
Total Protein: 6.3 g/dL (ref 6.0–8.3)

## 2020-02-28 LAB — HEMOGLOBIN A1C: Hgb A1c MFr Bld: 5.8 % (ref 4.6–6.5)

## 2020-02-28 LAB — TSH: TSH: 1.22 u[IU]/mL (ref 0.35–4.50)

## 2020-02-28 LAB — URIC ACID: Uric Acid, Serum: 4 mg/dL (ref 2.4–7.0)

## 2020-02-28 LAB — VITAMIN B12: Vitamin B-12: >1526 pg/mL — ABNORMAL HIGH (ref 211–911)

## 2020-02-28 NOTE — Telephone Encounter (Signed)
Faxed Cologuard request to [employer] at [phone] and received a confirmation that it went through.

## 2020-02-28 NOTE — Patient Instructions (Addendum)
Look into water exercise for balance and let me know if you want  To do formal PT. Will notify you  of labs when available.  Consider uro   PT .     Do kegels also . Can do   Physical therapy for bladder .   Continue lotrimin and keep dry as much possible.  Another week or so . Let us know if not getting better  Leg   Effects seem like old damage from swelling Get the weight back down ..... Not sure  You need the Vit E
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
## 2020-03-01 ENCOUNTER — Other Ambulatory Visit: Payer: Self-pay | Admitting: Internal Medicine

## 2020-03-03 NOTE — Progress Notes (Signed)
Tell patient that urine culture shows bacteria sensitive to medication given . Should resolve with current treatment .FU if not better.

## 2020-03-03 NOTE — Progress Notes (Signed)
Lipids are good  Kidney function about the same Uric acid is good range  No diabetes and thyroid is in range   b12 is high  you could decrease your  b12 to less frequent

## 2020-03-16 ENCOUNTER — Telehealth (HOSPITAL_COMMUNITY): Payer: Self-pay | Admitting: Pharmacist

## 2020-03-16 DIAGNOSIS — M25511 Pain in right shoulder: Secondary | ICD-10-CM | POA: Diagnosis not present

## 2020-03-16 DIAGNOSIS — M25522 Pain in left elbow: Secondary | ICD-10-CM | POA: Diagnosis not present

## 2020-03-16 NOTE — Telephone Encounter (Signed)
Sent in [employer] application to [employer] for [employer].    Audry Riles, PharmD, BCPS, BCCP, CPP Heart Failure Clinic Pharmacist [phone]

## 2020-03-17 NOTE — Telephone Encounter (Signed)
Advanced Heart Failure Patient Advocate Encounter   Patient was approved to receive Adcirca from [facility]. Patient will receive Adcirca from [facility].   Effective dates: 03/17/20 through 03/17/21  Audry Riles, PharmD, BCPS, BCCP, CPP Heart Failure Clinic Pharmacist [phone]

## 2020-03-18 ENCOUNTER — Other Ambulatory Visit (HOSPITAL_COMMUNITY): Payer: Self-pay | Admitting: Cardiology

## 2020-03-29 DIAGNOSIS — Z1212 Encounter for screening for malignant neoplasm of rectum: Secondary | ICD-10-CM | POA: Diagnosis not present

## 2020-03-29 DIAGNOSIS — Z1211 Encounter for screening for malignant neoplasm of colon: Secondary | ICD-10-CM | POA: Diagnosis not present

## 2020-03-29 LAB — COLOGUARD: Cologuard: NEGATIVE

## 2020-04-03 ENCOUNTER — Other Ambulatory Visit: Payer: Self-pay

## 2020-04-03 ENCOUNTER — Ambulatory Visit (HOSPITAL_COMMUNITY)
Admission: RE | Admit: 2020-04-03 | Discharge: 2020-04-03 | Disposition: A | Discharge status: Home / Self Care | Payer: PPO | Source: Ambulatory Visit | Attending: Cardiology | Admitting: Cardiology

## 2020-04-03 DIAGNOSIS — I5032 Chronic diastolic (congestive) heart failure: Secondary | ICD-10-CM | POA: Diagnosis not present

## 2020-04-03 DIAGNOSIS — I482 Chronic atrial fibrillation, unspecified: Secondary | ICD-10-CM

## 2020-04-03 LAB — COLOGUARD: COLOGUARD: NEGATIVE

## 2020-04-03 NOTE — Progress Notes (Signed)
Heart Failure TeleHealth Note  Due to national recommendations of social distancing due to [place] 19, Audio/video telehealth visit is felt to be most appropriate for this patient at this time.  See MyChart message from today for patient consent regarding telehealth for [name].  Date:  04/03/2020   ID:  [name], DOB 07-28-1951, MRN [id number]  Location: Home  Provider location: [place] [facility] Failure Type of Visit: Established patient   PCP:  [name], MD  Cardiologist:  [name]  Chief Complaint:  Shortness of breath   History of Present Illness: [name] is a 69 y.o. female who presents via audio/video conferencing for a telehealth visit today.     she denies symptoms worrisome for COVID 13.   69 y.o. with PMH of pulmonary HTN, morbid obesity, chronic afib (on Xarelto, CVA in 2012, OHS/OSA (Had U PE3 surgery, and chronic respiratory failure with hypoxemia on continuous 02 at 2 lpm via [place]).  Admitted 3/2 ->05/14/02 with A/C diastolic CHF and A/C respiratory failure. Pt initially refused Bipap so venti mask used. Eventually tolerated transition to BiPAP. Overall pt diuresed from 285 lbs down to 229 lbs with 38 L of diuresis.  Echo 11/07/16 EF 55-60%, PASP 52m Hg, mildly dilated RV with moderate to severely decreased RV systolic function. RHC/LHC in 3/18 showed no coronary disease and confirmed severe PH.    Echo in 7/20 showed EF 60-65%, mild LVH, normal RV, PASP 38 mmHg, mild AS.   She has been using Bipap nightly and is no longer using oxygen during the day.  She had a prior shoulder replacement but per Dr. NVeverly Fellsneeds a re-do.  She has had significant shoulder pain.  She has some difficulty with balance, walks with a cane.  No lightheadedness or falls.  No chest pain.  Limited mainly by low back and knee pain but not short of breath walking around the house.  She is able to get up a flight of stairs but it is difficult due to pain.  No  orthopnea/PND.  No palpitations.  Weight has been stable at home.    Labs (1/19): K 3.7, creatinine 1.37 Labs (11/19): K 3.9, creatinine 1.2, LDL 67, hgb 13.2 Labs (1/20): K 3.5, creatinine 1.34 Labs (5/20): K 3.9, creatinine 1.18, LDL 73 Labs (6/21): K 4.7, creatinine 1.19, LDL 71, hgb 13.7  PMH 1. 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 - RHC/LHC 11/07/16: No angiographic CAD; PA 90/55, LVEDP 23 (PCWP inaccurate), CI 2.15 Fick/2.32 Thermo, shunt run negative, PVR 5.7 WU.  - Echo (7/20): EF 60-65%, mild LVH, normal RV, PASP 38 mmHg, mild AS.  2. Chronic hypercarbic/hypoxic respiratory failure with OHS/OSA. Sees Dr. McLake BellsUsing nightly BiPAP and oxygen by nasal cannula during the day.  3. Atrial fibrillation: Chronic - Rate controlled on Xarelto + atenolol  4. 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 PVR 5.7 WU on RHC 3/18. Suspect group 2 (elevated LA pressure) and group 3 (OHS/OSA) PH. However, cannot rule out group 1 component. She had V/Q scan that was not suggestive of chronic PE and high resolution chest CT that was not suggestive of ILD. ANA, RF, anti-SCL70 all negative.   5. H/o CVA 6. Hypothyroidism 7. H/o TKR 8. Aortic stenosis: Mild on 7/20 echo.   Current Outpatient Medications  Medication Sig Dispense Refill  . acetaZOLAMIDE (DIAMOX) 250 MG tablet TAKE 1 TABLET BY MOUTH EVERY DAY 90 tablet 2  . ADCIRCA 20 MG tablet TAKE 2 TABLETS BY MOUTH DAILY. CALL [phone] TO REFILL 180 tablet 1  . allopurinol (ZYLOPRIM) 100 MG tablet Take 1 tablet (100 mg total) by mouth 2 (two) times daily. 180 tablet 1  . amoxicillin (AMOXIL) 500 MG capsule Uses for dental appointments    . atenolol (TENORMIN) 50 MG tablet Take 1 tablet (50 mg total) by mouth daily. Must be seen for further  refills 90 tablet 0  . betamethasone dipropionate 0.05 % cream APPLY SPARINGLY TO AFFECTED AREA TWICE A DAY (Patient not taking: Reported on 02/28/2020)    . Cholecalciferol (VITAMIN D) 50 MCG (2000 UT) CAPS Take 1 capsule by mouth 4 (four) times a week.    . colchicine 0.6 MG tablet Take 1 tablet (0.6 mg total) by mouth daily as needed (flareups). 60 tablet 1  . cyanocobalamin 2000 MCG tablet Take 2,000 mcg by mouth 3 (three) times a week.    . diclofenac sodium (VOLTAREN) 1 % GEL Apply 2 g topically 2 (two) times daily as needed (for arthritis).     Marland Kitchen levothyroxine (SYNTHROID) 75 MCG tablet TAKE 1 TABLET (75 MCG TOTAL) BY MOUTH DAILY. SCHEDULE FOLLOW UP WITH LABS FOR FURTHER REFILLS. 90 tablet 0  . methocarbamol (ROBAXIN) 500 MG tablet TAKE 1/2 TABLET BY MOUTH AS NEEDED FOR MUSCLE SPASMS. (Patient not taking: Reported on 11/06/2019) 30 tablet 0  . OMEGA-3 KRILL OIL PO Take 1 capsule by mouth 3 (three) times a week.    . potassium chloride (KLOR-CON) 10 MEQ tablet TAKE 2 TABLETS (20 MEQ TOTAL) BY MOUTH 2 (TWO) TIMES DAILY. MUST BE SEEN FOR FURTHER REFILLS 120 tablet 1  . rivaroxaban (XARELTO) 20 MG TABS tablet Take 1 tablet (20 mg total) by mouth daily with supper. Must be seen for further refills 90 tablet 0  . rosuvastatin (CRESTOR) 5 MG tablet Take 1 tablet (5 mg total) by mouth daily. Must be seen in office for further refills 90 tablet 0  . torsemide (DEMADEX) 20 MG tablet TAKE 2 TABLETS (40 MG TOTAL) BY MOUTH 2 (TWO) TIMES DAILY. (Patient taking differently: 20 mg. Patient reports taking 2 tablets in the morning and 1 tablet in the afternoon) 360 tablet 2  . traMADol (ULTRAM) 50 MG tablet Take 1 tablet (50 mg total) by mouth 3 (three) times daily as needed. 60 tablet 1   No current facility-administered medications for this encounter.    Allergies:   Ambien [zolpidem tartrate], Losartan, Prevacid [lansoprazole], Adhesive [tape], Keflex [cephalexin], Motrin [ibuprofen], Prilosec [omeprazole],  Ace inhibitors, Benadryl [diphenhydramine hcl], and Spironolactone   Social History:  The patient  reports that she has never smoked. She has never used smokeless tobacco. She reports current alcohol use of about 1.0 standard drink of alcohol per week. She reports that she does not use drugs.   Family History:  The patient's family history includes COPD in her mother; Diabetes in her father; Heart attack in her father; Hypertension in her brother, father, mother, and sister; Liver cancer in her father; Osteoporosis in her mother; Stroke in her maternal grandmother.   ROS:  Please see the history of present illness.   All other systems are personally reviewed and negative.   Exam:  (Video/Tele Health Call; Exam is subjective and or/visual.) BP 122/78, HR 58, oxygen saturation 97% General:  Speaks in full sentences. No resp  difficulty. Lungs: Normal respiratory effort with conversation.  Abdomen: Non-distended per patient report Extremities: Pt denies edema. Neuro: Alert & oriented x 3.    Recent Labs: 02/28/2020: ALT 7; BUN 19; Creatinine, Ser 1.19; Hemoglobin 13.7; Platelets 133.0; Potassium 4.7; Sodium 143; TSH 1.22  Personally reviewed   Wt Readings from Last 3 Encounters:  02/28/20 115.6 kg (254 lb 12.8 oz)  09/19/18 109.6 kg (241 lb 9.6 oz)  08/15/18 109.4 kg (241 lb 3.2 oz)    ASSESSMENT AND PLAN:  1. Chronic diastolic CHF:  Associated with severe pulmonary hypertension and prominent RV failure.  NYHA class II symptoms.  Most recent echo in 7/20 showed some improvement with normal-appearing RV and only mild pulmonary hypertension by noninvasive measure. More limited by orthopedic issues than CHF.  She does not appear volume overloaded on video exam and weight is stable.   - Continue torsemide 1m in the am and 20 mg in the pm.  - Continue diamox 250 mg daily.  - I will arrange for a BMET.  - She needs repeat echo to follow RV, will arrange.  2. Chronic hypercarbic/hypoxic  respiratory failure with OHS/OSA: Used to see Dr. MLake Bells  Using bipap at night but has been able to stop oxygen during the day.   - Compliant with Bipap nightly.  - Continue regular exercise and efforts at weight loss. - Needs pulmonary followup given plan for possible shoulder surgery.    3. Atrial fibrillation: Chronic, rate controlled  - Continue atenolol 50 mg daily.    - Continue Xarelto for anticoagulation.  Recent CBC stable.  4. 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 PVR 5.7 WU by [place]ake [place] 3/18. Suspect group 2 (elevated LA pressure) and group 3 (OHS/OSA) PH. However, cannot rule out group 1 component. She had V/Q scan that was not suggestive of chronic PE and high resolution chest CT that was not suggestive of ILD. ANA, RF, anti-SCL70 all negative.  She did not have a marked improvement from taking Adcirca => suspect predominantly group 2 and 3 PH.  Will hold off on additional pulmonary vasodilators. Echo in 7/20 showed improvement in RV function.  - Continue Adcirca 40 mg daily.  - Continue BiPAP at night for OSA. She no longer requires oxygen during the day. - 6 minute walk next appt in office. - Will arrange echo as above.   5. HTN: BP controlled.  6. Pre-operative evaluation: Patient is at moderate risk for surgical complications.  She has pulmonary hypertension/RV failure, but last echo showed improved PA pressure and symptomatically she has been doing well.  Able to climb a flight of stairs.  Prior to surgery, I would like her to get an echo to make sure RV and PA pressure have not worsened and would also like her to have followup with pulmonary given OHS/OSA.  [facility]   COVID screen The patient does not have any symptoms that suggest any further testing/ screening at this time.  Social distancing reinforced today.  Patient Risk: After full review of this patients clinical status, I feel that  they are at moderate risk for cardiac decompensation at this time.  Relevant cardiac medications were reviewed at length with the patient today. The patient does not have concerns regarding their medications at this time.   Recommended follow-up:  3 months in office.   Today, I have spent 17 minutes with the patient with telehealth technology discussing the above  issues .    Signed, Loralie Champagne, MD  04/03/2020  [facility] [address] [facility] [place] [address] [phone] (office) [phone] (fax)

## 2020-04-07 ENCOUNTER — Telehealth (HOSPITAL_COMMUNITY): Payer: Self-pay | Admitting: Cardiology

## 2020-04-07 ENCOUNTER — Telehealth: Payer: Self-pay

## 2020-04-07 DIAGNOSIS — L814 Other melanin hyperpigmentation: Secondary | ICD-10-CM | POA: Diagnosis not present

## 2020-04-07 DIAGNOSIS — D225 Melanocytic nevi of trunk: Secondary | ICD-10-CM | POA: Diagnosis not present

## 2020-04-07 DIAGNOSIS — L304 Erythema intertrigo: Secondary | ICD-10-CM | POA: Diagnosis not present

## 2020-04-07 DIAGNOSIS — L570 Actinic keratosis: Secondary | ICD-10-CM | POA: Diagnosis not present

## 2020-04-07 DIAGNOSIS — D1801 Hemangioma of skin and subcutaneous tissue: Secondary | ICD-10-CM | POA: Diagnosis not present

## 2020-04-07 DIAGNOSIS — L821 Other seborrheic keratosis: Secondary | ICD-10-CM | POA: Diagnosis not present

## 2020-04-07 DIAGNOSIS — L578 Other skin changes due to chronic exposure to nonionizing radiation: Secondary | ICD-10-CM | POA: Diagnosis not present

## 2020-04-07 NOTE — Telephone Encounter (Signed)
1. Echo scheduled and patient aware 2. Patient scheduled to see pulmonology 8/4 3. Follow up with CHF clinic scheduled- virtual as requested by patient  Medical clearance completed by Dr Aundra Dubin Procedure is postponed until echo is done and patient is seen by pulmonology.. Medication prep-n/a  Faxed to [facility]

## 2020-04-07 NOTE — Telephone Encounter (Signed)
-----  Message from Larey Dresser, MD sent at 04/03/2020  9:37 AM EDT ----- Virtual visit: 1. Needs echocardiogram (do soon) for RV and pulmonary hypertension evaluation.  2. Needs to see her pulmonologist prior to shoulder surgery.  3. Followup in 3 months.

## 2020-04-07 NOTE — Telephone Encounter (Signed)
Called patient and gave her the Negative Cologuard results. Patient verbalized an understanding.

## 2020-04-08 ENCOUNTER — Ambulatory Visit: Payer: PPO | Admitting: Adult Health

## 2020-04-08 ENCOUNTER — Other Ambulatory Visit: Payer: Self-pay

## 2020-04-08 ENCOUNTER — Encounter: Payer: Self-pay | Admitting: Adult Health

## 2020-04-08 VITALS — BP 132/64 | HR 78 | Temp 98.2°F | Ht 60.5 in | Wt 250.0 lb

## 2020-04-08 DIAGNOSIS — Z01818 Encounter for other preprocedural examination: Secondary | ICD-10-CM

## 2020-04-08 DIAGNOSIS — I270 Primary pulmonary hypertension: Secondary | ICD-10-CM | POA: Diagnosis not present

## 2020-04-08 DIAGNOSIS — R0902 Hypoxemia: Secondary | ICD-10-CM | POA: Diagnosis not present

## 2020-04-08 DIAGNOSIS — G4733 Obstructive sleep apnea (adult) (pediatric): Secondary | ICD-10-CM

## 2020-04-08 DIAGNOSIS — I5032 Chronic diastolic (congestive) heart failure: Secondary | ICD-10-CM

## 2020-04-08 NOTE — Assessment & Plan Note (Signed)
Appears euvolemic on exam. Weights have been stable. No increased lower extremity swelling. Continue follow-up with cardiology and planned echo for next week.  Plan  Patient Instructions  Continue on CPAP  At bedtime  .  Work on healthy weight  Do not drive if sleepy.  Continue with follow up with Cardiology as planned.  Continue on Torsemide .  Low salt diet.  Continue on Adcirca  Preop clearance completed, good luck with upcoming surgery.  Follow up with Dr. Hermina Staggers or Lakesa Coste NP  in 6  months and As needed   Please contact office for sooner follow up if symptoms do not improve or worsen or seek emergency care

## 2020-04-08 NOTE — Assessment & Plan Note (Signed)
Severe obstructive sleep apnea with OHS excellent control on nocturnal CPAP is doing very well with excellent control and compliance  Plan  Patient Instructions  Continue on CPAP  At bedtime  .  Work on healthy weight  Do not drive if sleepy.  Continue with follow up with Cardiology as planned.  Continue on Torsemide .  Low salt diet.  Continue on Adcirca  Preop clearance completed, good luck with upcoming surgery.  Follow up with Dr. Hermina Staggers or Shavon Zenz NP  in 6  months and As needed   Please contact office for sooner follow up if symptoms do not improve or worsen or seek emergency care

## 2020-04-08 NOTE — Assessment & Plan Note (Signed)
Chronic respiratory failure -uses oxygen 2 L with activity as needed. O2 saturation goals greater than 88 to 90%. Patient has had decreased oxygen demands. And appears to be doing well.  Plan  Patient Instructions  Continue on CPAP  At bedtime  .  Work on healthy weight  Do not drive if sleepy.  Continue with follow up with Cardiology as planned.  Continue on Torsemide .  Low salt diet.  Continue on Adcirca  Preop clearance completed, good luck with upcoming surgery.  Follow up with Dr. Hermina Staggers or Eleisha Branscomb NP  in 6  months and As needed   Please contact office for sooner follow up if symptoms do not improve or worsen or seek emergency care    '
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
## 2020-04-08 NOTE — Assessment & Plan Note (Signed)
Continue to follow-up with cardiology and current regimen. Has 2D echo planned for next week.

## 2020-04-08 NOTE — Assessment & Plan Note (Signed)
Pulmonary preop clearance-patient has a moderate to high surgical risk as with her age and underlying multiple comorbidities including OSA/OHS with pulmonary hypertension and diastolic heart failure. She is not excluded from surgery may proceed with the following precautions. She remains independent is on very little oxygen and has excellent compliance and control on nocturnal CPAP.   Major Pulmonary risks identified in the multifactorial risk analysis are but not limited to a) pneumonia; b) recurrent intubation risk; c) prolonged or recurrent acute respiratory failure needing mechanical ventilation; d) prolonged hospitalization; e) DVT/Pulmonary embolism; f) Acute Pulmonary edema Check chest x-ray today  Recommend 1. Short duration of surgery as much as possible and avoid paralytic if possible 2. Recovery in step down or ICU with Pulmonary consultation if indicated.  3. DVT prophylaxis as indicated.  4. Aggressive pulmonary toilet with o2, bronchodilatation, and incentive spirometry and early ambulation 5. CPAP At bedtime  And post op setting as needed.

## 2020-04-08 NOTE — Patient Instructions (Signed)
Continue on CPAP  At bedtime  .  Work on healthy weight  Do not drive if sleepy.  Continue with follow up with Cardiology as planned.  Continue on Torsemide .  Low salt diet.  Continue on Adcirca  Preop clearance completed, good luck with upcoming surgery.  Follow up with Dr. Hermina Staggers or Nicholle Falzon NP  in 6  months and As needed   Please contact office for sooner follow up if symptoms do not improve or worsen or seek emergency care

## 2020-04-14 ENCOUNTER — Other Ambulatory Visit (HOSPITAL_COMMUNITY): Payer: Self-pay | Admitting: Cardiology

## 2020-04-21 ENCOUNTER — Other Ambulatory Visit: Payer: Self-pay

## 2020-04-21 ENCOUNTER — Ambulatory Visit (INDEPENDENT_AMBULATORY_CARE_PROVIDER_SITE_OTHER): Payer: PPO

## 2020-04-21 DIAGNOSIS — Z780 Asymptomatic menopausal state: Secondary | ICD-10-CM | POA: Diagnosis not present

## 2020-04-21 DIAGNOSIS — Z1231 Encounter for screening mammogram for malignant neoplasm of breast: Secondary | ICD-10-CM | POA: Diagnosis not present

## 2020-04-21 DIAGNOSIS — Z0000 Encounter for general adult medical examination without abnormal findings: Secondary | ICD-10-CM
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
## 2020-04-21 NOTE — Patient Instructions (Signed)
[name] , Thank you for taking time to come for your Medicare Wellness Visit. I appreciate your ongoing commitment to your health goals. Please review the following plan we discussed and let me know if I can assist you in the future.   Screening recommendations/referrals: Colonoscopy: Up to date, next cologuard due 03/28/2023 Mammogram: Currently due, orders placed this visit Bone Density: Currently due, orders placed this visit  Recommended yearly ophthalmology/optometry visit for glaucoma screening and checkup Recommended yearly dental visit for hygiene and checkup  Vaccinations: Influenza vaccine: Up to date, next due this fall 69 Pneumococcal vaccine: Completed series Tdap vaccine: Up to date, next due 07/17/2023 Shingles vaccine: completed series    Advanced directives: Advance directive discussed with you today. Even though you declined this today please call our office should you change your mind and we can give you the proper paperwork for you to fill out.   Conditions/risks identified: None   Next appointment: 05/08/2020 @ 10:30 am via Telephone with Pharmacist at [facility] 71 Years and Older, Female Preventive care refers to lifestyle choices and visits with your health care provider that can promote health and wellness. What does preventive care include?  A yearly physical exam. This is also called an annual well check.  Dental exams once or twice a year.  Routine eye exams. Ask your health care provider how often you should have your eyes checked.  Personal lifestyle choices, including:  Daily care of your teeth and gums.  Regular physical activity.  Eating a healthy diet.  Avoiding tobacco and drug use.  Limiting alcohol use.  Practicing safe sex.  Taking low-dose aspirin every day.  Taking vitamin and mineral supplements as recommended by your health care provider. What happens during an annual well check? The services  and screenings done by your health care provider during your annual well check will depend on your age, overall health, lifestyle risk factors, and family history of disease. Counseling  Your health care provider may ask you questions about your:  Alcohol use.  Tobacco use.  Drug use.  Emotional well-being.  Home and relationship well-being.  Sexual activity.  Eating habits.  History of falls.  Memory and ability to understand (cognition).  Work and work Statistician.  Reproductive health. Screening  You may have the following tests or measurements:  Height, weight, and BMI.  Blood pressure.  Lipid and cholesterol levels. These may be checked every 5 years, or more frequently if you are over 71 years old.  Skin check.  Lung cancer screening. You may have this screening every year starting at age 71 if you have a 30-pack-year history of smoking and currently smoke or have quit within the past 15 years.  Fecal occult blood test (FOBT) of the stool. You may have this test every year starting at age 71.  Flexible sigmoidoscopy or colonoscopy. You may have a sigmoidoscopy every 5 years or a colonoscopy every 10 years starting at age 71.  Hepatitis C blood test.  Hepatitis B blood test.  Sexually transmitted disease (STD) testing.  Diabetes screening. This is done by checking your blood sugar (glucose) after you have not eaten for a while (fasting). You may have this done every 1-3 years.  Bone density scan. This is done to screen for osteoporosis. You may have this done starting at age 71.  Mammogram. This may be done every 1-2 years. Talk to your health care provider about how often you should have regular  mammograms. Talk with your health care provider about your test results, treatment options, and if necessary, the need for more tests. Vaccines  Your health care provider may recommend certain vaccines, such as:  Influenza vaccine. This is recommended every  year.  Tetanus, diphtheria, and acellular pertussis (Tdap, Td) vaccine. You may need a Td booster every 10 years.  Zoster vaccine. You may need this after age 71.  Pneumococcal 13-valent conjugate (PCV13) vaccine. One dose is recommended after age 71.  Pneumococcal polysaccharide (PPSV23) vaccine. One dose is recommended after age 71. Talk to your health care provider about which screenings and vaccines you need and how often you need them. This information is not intended to replace advice given to you by your health care provider. Make sure you discuss any questions you have with your health care provider. Document Released: 09/18/2015 Document Revised: 05/11/2016 Document Reviewed: 06/23/2015 Elsevier Interactive Patient Education  2017 [place] Prevention in the Home Falls can cause injuries. They can happen to people of all ages. There are many things you can do to make your home safe and to help prevent falls. What can I do on the outside of my home?  Regularly fix the edges of walkways and driveways and fix any cracks.  Remove anything that might make you trip as you walk through a door, such as a raised step or threshold.  Trim any bushes or trees on the path to your home.  Use bright outdoor lighting.  Clear any walking paths of anything that might make someone trip, such as rocks or tools.  Regularly check to see if handrails are loose or broken. Make sure that both sides of any steps have handrails.  Any raised decks and porches should have guardrails on the edges.  Have any leaves, snow, or ice cleared regularly.  Use sand or salt on walking paths during winter.  Clean up any spills in your garage right away. This includes oil or grease spills. What can I do in the bathroom?  Use night lights.  Install grab bars by the toilet and in the tub and shower. Do not use towel bars as grab bars.  Use non-skid mats or decals in the tub or shower.  If you  need to sit down in the shower, use a plastic, non-slip stool.  Keep the floor dry. Clean up any water that spills on the floor as soon as it happens.  Remove soap buildup in the tub or shower regularly.  Attach bath mats securely with double-sided non-slip rug tape.  Do not have throw rugs and other things on the floor that can make you trip. What can I do in the bedroom?  Use night lights.  Make sure that you have a light by your bed that is easy to reach.  Do not use any sheets or blankets that are too big for your bed. They should not hang down onto the floor.  Have a firm chair that has side arms. You can use this for support while you get dressed.  Do not have throw rugs and other things on the floor that can make you trip. What can I do in the kitchen?  Clean up any spills right away.  Avoid walking on wet floors.  Keep items that you use a lot in easy-to-reach places.  If you need to reach something above you, use a strong step stool that has a grab bar.  Keep electrical cords out of the way.  Do not use floor polish or wax that makes floors slippery. If you must use wax, use non-skid floor wax.  Do not have throw rugs and other things on the floor that can make you trip. What can I do with my stairs?  Do not leave any items on the stairs.  Make sure that there are handrails on both sides of the stairs and use them. Fix handrails that are broken or loose. Make sure that handrails are as long as the stairways.  Check any carpeting to make sure that it is firmly attached to the stairs. Fix any carpet that is loose or worn.  Avoid having throw rugs at the top or bottom of the stairs. If you do have throw rugs, attach them to the floor with carpet tape.  Make sure that you have a light switch at the top of the stairs and the bottom of the stairs. If you do not have them, ask someone to add them for you. What else can I do to help prevent falls?  Wear shoes  that:  Do not have high heels.  Have rubber bottoms.  Are comfortable and fit you well.  Are closed at the toe. Do not wear sandals.  If you use a stepladder:  Make sure that it is fully opened. Do not climb a closed stepladder.  Make sure that both sides of the stepladder are locked into place.  Ask someone to hold it for you, if possible.  Clearly mark and make sure that you can see:  Any grab bars or handrails.  First and last steps.  Where the edge of each step is.  Use tools that help you move around (mobility aids) if they are needed. These include:  Canes.  Walkers.  Scooters.  Crutches.  Turn on the lights when you go into a dark area. Replace any light bulbs as soon as they burn out.  Set up your furniture so you have a clear path. Avoid moving your furniture around.  If any of your floors are uneven, fix them.  If there are any pets around you, be aware of where they are.  Review your medicines with your doctor. Some medicines can make you feel dizzy. This can increase your chance of falling. Ask your doctor what other things that you can do to help prevent falls. This information is not intended to replace advice given to you by your health care provider. Make sure you discuss any questions you have with your health care provider. Document Released: 06/18/2009 Document Revised: 01/28/2016 Document Reviewed: 09/26/2014 Elsevier Interactive Patient Education  2017 [employer].

## 2020-04-22 ENCOUNTER — Other Ambulatory Visit: Payer: Self-pay

## 2020-04-22 ENCOUNTER — Other Ambulatory Visit (HOSPITAL_COMMUNITY): Payer: Self-pay | Admitting: Cardiology

## 2020-04-22 ENCOUNTER — Ambulatory Visit (INDEPENDENT_AMBULATORY_CARE_PROVIDER_SITE_OTHER): Payer: PPO

## 2020-04-22 DIAGNOSIS — I270 Primary pulmonary hypertension: Secondary | ICD-10-CM | POA: Diagnosis not present

## 2020-04-22 DIAGNOSIS — Z01818 Encounter for other preprocedural examination: Secondary | ICD-10-CM | POA: Diagnosis not present

## 2020-04-22 DIAGNOSIS — I10 Essential (primary) hypertension: Secondary | ICD-10-CM | POA: Diagnosis not present

## 2020-04-23 ENCOUNTER — Other Ambulatory Visit: Payer: Self-pay

## 2020-04-23 ENCOUNTER — Ambulatory Visit (HOSPITAL_COMMUNITY)
Admission: RE | Admit: 2020-04-23 | Discharge: 2020-04-23 | Disposition: A | Discharge status: Home / Self Care | Payer: PPO | Source: Ambulatory Visit | Attending: Internal Medicine | Admitting: Internal Medicine

## 2020-04-23 DIAGNOSIS — I5032 Chronic diastolic (congestive) heart failure: Secondary | ICD-10-CM

## 2020-04-23 DIAGNOSIS — I272 Pulmonary hypertension, unspecified: Secondary | ICD-10-CM | POA: Insufficient documentation

## 2020-04-23 DIAGNOSIS — E785 Hyperlipidemia, unspecified: Secondary | ICD-10-CM | POA: Diagnosis not present

## 2020-04-23 DIAGNOSIS — I4891 Unspecified atrial fibrillation: Secondary | ICD-10-CM | POA: Diagnosis not present

## 2020-04-23 LAB — ECHOCARDIOGRAM COMPLETE
Area-P 1/2: 3.6 cm2
S' Lateral: 2.7 cm

## 2020-04-23 NOTE — Progress Notes (Signed)
Called and left VM letting patient know of CXR results and no change in OV recommendations.  Nothing further needed.

## 2020-04-23 NOTE — Progress Notes (Signed)
  Echocardiogram 2D Echocardiogram has been performed.  Jennette Dubin 04/23/2020, 10:53 AM

## 2020-04-27 ENCOUNTER — Other Ambulatory Visit: Payer: Self-pay | Admitting: Internal Medicine

## 2020-04-27 DIAGNOSIS — E2839 Other primary ovarian failure: Secondary | ICD-10-CM

## 2020-05-04 ENCOUNTER — Other Ambulatory Visit (HOSPITAL_COMMUNITY): Payer: Self-pay | Admitting: Cardiology

## 2020-05-07 ENCOUNTER — Telehealth: Payer: Self-pay | Admitting: Adult Health

## 2020-05-08 ENCOUNTER — Ambulatory Visit: Payer: PPO

## 2020-05-08 DIAGNOSIS — I10 Essential (primary) hypertension: Secondary | ICD-10-CM

## 2020-05-08 DIAGNOSIS — E785 Hyperlipidemia, unspecified: Secondary | ICD-10-CM

## 2020-05-08 NOTE — Telephone Encounter (Signed)
Pt states that pharmacist did not give reasoning for covid vaccine refusal.  Pt states she will follow up with pharmacy to see when she is eligible for booster.  Nothing further needed at this time- will close encounter.

## 2020-05-08 NOTE — Telephone Encounter (Signed)
Called and spoke with pt. Pt said that she had [facility] the [facility] booster vaccine but when she went to get it, she was told by them that she was not eligible to receive it so they did not give it to her. Pt said she feels like pharmacist was concerned about losing license if she had given her the vaccine.  Pt wanted to know by TP if she could be considered to be eligible to receive the vaccine. Tammy, please advise.

## 2020-05-08 NOTE — Telephone Encounter (Signed)
Did they give her a reason , is it because it has not been 8 months since last covid vaccine that is what I am hearing . Not sure will have to ask pharmacist.

## 2020-05-08 NOTE — Chronic Care Management (AMB) (Signed)
Chronic Care Management Pharmacy  Name: [name]  MRN: [id number] DOB: 12-18-1950  Initial Questions: 1. Have you seen any other providers since your last visit? No  2. Any changes in your medicines or health? No   Chief Complaint/ HPI [name],  69 y.o. , female presents for their Follow-Up CCM visit with the clinical pharmacist via telephone due to COVID-19 Pandemic.  PCP : [name], MD  Their chronic conditions include: Afib, Pulmonary HTN, ASCVD (hx of stroke), CHF, HTN, Hypothyroidism,  HLD, Prediabetes, Gout, Osteoarthritis of spine/ muscle spasms.   Office Visits: 04/21/2020 - Presented to Ofilia Neas, LPN for AWV. Mammogram and DEXA were ordered. No medication changes.   02/28/2020- Patient presented to Dr. [name], MD for follow up. Checked labs. All WNL except vit B was high and patient could decrease use. Complains of urinary urgency at night from UTI and currently taking omnicef. No medication changes.  Consult Visit: 04/08/2020- Pulmonology- Patient presented to [name], NP for 47-monthfollow up for OSA/OHS, respiratory failure, diastolic HF, and pulmonary HTN.  Patient uses 2L of oxygen with heavy activity and remains on CPAP. Patient to continue on diuretics and Adcirca.   04/03/2020- Cardiology- Patient presented to Dr. DLoralie Champagne MD for CHF follow up. Continue torsemide & diamox. Pt is rate controlled and to continue Xarelto and atenolol. Ordered repeat Echo and arranged for BMET..   Medications: Outpatient Encounter Medications as of 05/08/2020  Medication Sig  . acetaZOLAMIDE (DIAMOX) 250 MG tablet TAKE 1 TABLET BY MOUTH EVERY DAY  . ADCIRCA 20 MG tablet TAKE 2 TABLETS BY MOUTH DAILY. CALL [phone] REFILL  . allopurinol (ZYLOPRIM) 100 MG tablet Take 1 tablet (100 mg total) by mouth 2 (two) times daily.  .Marland Kitchenamoxicillin (AMOXIL) 500 MG capsule Uses for dental appointments  . atenolol (TENORMIN) 50 MG tablet TAKE 1 TABLET (50 MG  TOTAL) BY MOUTH DAILY. MUST BE SEEN FOR FURTHER REFILLS  . betamethasone dipropionate 0.05 % cream APPLY SPARINGLY TO AFFECTED AREA TWICE A DAY  . Cholecalciferol (VITAMIN D) 50 MCG (2000 UT) CAPS Take 1 capsule by mouth 4 (four) times a week.  . colchicine 0.6 MG tablet Take 1 tablet (0.6 mg total) by mouth daily as needed (flareups).  . cyanocobalamin 2000 MCG tablet Take 2,000 mcg by mouth 3 (three) times a week.  . diclofenac sodium (VOLTAREN) 1 % GEL Apply 2 g topically 2 (two) times daily as needed (for arthritis).   .Marland Kitchenlevothyroxine (SYNTHROID) 75 MCG tablet TAKE 1 TABLET (75 MCG TOTAL) BY MOUTH DAILY. SCHEDULE FOLLOW UP WITH LABS FOR FURTHER REFILLS.  . methocarbamol (ROBAXIN) 500 MG tablet TAKE 1/2 TABLET BY MOUTH AS NEEDED FOR MUSCLE SPASMS.  .Marland KitchenOMEGA-3 KRILL OIL PO Take 1 capsule by mouth 3 (three) times a week.  . potassium chloride (KLOR-CON) 10 MEQ tablet Take 2 tablets (20 mEq total) by mouth 2 (two) times daily. Must be seen for further refills  . rosuvastatin (CRESTOR) 5 MG tablet TAKE 1 TABLET (5 MG TOTAL) BY MOUTH DAILY. MUST BE SEEN IN OFFICE FOR FURTHER REFILLS  . torsemide (DEMADEX) 20 MG tablet TAKE 2 TABLETS (40 MG TOTAL) BY MOUTH 2 (TWO) TIMES DAILY.  . traMADol (ULTRAM) 50 MG tablet Take 1 tablet (50 mg total) by mouth 3 (three) times daily as needed.  .Alveda Reasons20 MG TABS tablet TAKE 1 TABLET (20 MG TOTAL) BY MOUTH DAILY WITH SUPPER. MUST BE SEEN FOR FURTHER REFILLS  . [  DISCONTINUED] TOPAMAX 50 MG tablet Take 50 mg by mouth.   No facility-administered encounter medications on file as of 05/08/2020.     Current Diagnosis/Assessment:  Goals Addressed   None    SDOH Interventions     Most Recent Value  SDOH Interventions  Financial Strain Interventions Intervention Not Indicated  Transportation Interventions Intervention Not Indicated      AFIB  Patient is currently rate controlled. HR: 65-70 BPM   Patient has failed these meds in past: none  Patient is  currently controlled on the following medications:  - atenolol 52m, 1 tablet daily    Anticoagulation - Xarelto 233m 1 tablet every day with supper  We discussed:  Discussed monitoring for signs and symptoms for bleeding (coughing up blood, prolonged nose bleeds, black, tarry stools)  Plan Patient denies dizziness/ orthostatic hypotension.  Managed by cardiologist.  Continue current medications.   Prediabetes   Recent Relevant Labs: Lab Results  Component Value Date/Time   HGBA1C 5.8 02/28/2020 11:35 AM   HGBA1C 5.8 01/30/2019 09:38 AM   MICROALBUR 0.2 07/09/2009 08:29 AM    Patient has failed these meds in past: none  Patient is currently controlled on the following medications: - diet controlled   We discussed: diet and exercise extensively.  - patient is limited in her ability to exercise  Plan Continue control with diet and exercise.    Heart Failure  Type: Diastolic  Last ejection fraction: 04/2020 60-65%  NYHA Class: I (no actitivty limitation)  Patient has failed these meds in past: furosemide  Patient is currently controlled on the following medications:  - acetazolamide 2506m1 tablet daily  - torsemide 38m47m tablets in the AM, 1 in the afternoon as needed  - potassium chloride 10mE3m tablets twice daily   Potassium: 4.7 (02/28/2020)  We discussed weighing daily; if you gain more than 3 pounds in one day or 5 pounds in one week call your doctor and sodium intake reduction (patient notes not using salt at all).  - Patient notes some swelling in the evening. Discussed elevating her feet and calling her cardiologist with weight changes.  Plan Managed by cardiologist.  Continue current medications.  ,  Hypertension  BP today is:  <130/80  Office blood pressures are  BP Readings from Last 3 Encounters:  04/08/20 132/64  02/28/20 116/74  09/19/18 140/72   Patient has failed these meds in the past: lisinopril (dropping BP too low).   Patient  checks BP at home infrequently  Patient home BP readings are ranging:  110-120/65-75 mmHg   Patient is controlled on:  - atenolol 50mg,69mablet daily  We discussed diet and exercise extensively  - diet: Patient notes diet consists of lots of fruits and vegetables during the summer especially.  - exercise: patient was doing chair exercises but then stopped as her legs were in pain. Patient is limited by mobility and uses a wheelchair.   Plan Denies dizziness/lightheadedness. Check blood pressure at least once weekly at home.  Managed by cardiologist.   Continue current medications and control with diet and exercise.    Hypothyroidism   TSH  Date Value Ref Range Status  02/28/2020 1.22 0.35 - 4.50 uIU/mL Final    No components found for: T4  Patient has failed these meds in past: none  Patient is currently controlled on the following medications:  - levothyroxine 75mcg,46mablet daily   Plan Continue current medications.   Hyperlipidemia/ Secondary prevention ASCVD  Lipid Panel   LDL goal < 70     Component Value Date/Time   CHOL 146 02/28/2020 1135   TRIG 104.0 02/28/2020 1135   HDL 54.90 02/28/2020 1135   CHOLHDL 3 02/28/2020 1135   VLDL 20.8 02/28/2020 1135   LDLCALC 71 02/28/2020 1135   LDLDIRECT 139.4 06/30/2010 0947    Patient has failed these meds in past: simvastatin Patient is currently controlled on the following medications:  - Rosuvastatin 5 mg, 1 tablet daily  -Omega 3 Krill Oil, 1 capsule three times weekly  We discussed:  diet and exercise extensively.  -  We discussed how a diet high in plant sterols (fruits/vegetables/nuts/whole grains/legumes) may reduce your cholesterol.   - Encouraged increasing fiber to a daily intake of 10-25g/day    Plan Consider increasing to rosuvastatin 20 mg as she is indicated for high intensity based on clinical ASCVD. Continue current medications.   Gout   Patient has failed these meds in past: none    Patient is currently controlled on the following medications:  - allopurinol 160m, 1 tablet twice daily  - colchicine 0.633m 1 tablet as needed for flare ups.   Uric acid: 4.0 (02/28/2020)   We discussed: Eating a low-purine diet. Avoid foods and drinks such as: liver, kidney, anchovies, asparagus, herring, mushrooms, mussels, beer,etc.  Plan Continue current medications.   Pulmonary HTN   Patient has failed these meds in past: none  Patient is currently controlled on the following medications:  - tadalafil (Adcirca) 2036m2 tablets once daily   Plan Managed by pulmonologist.  Continue current medications.   Osteoarthritis of spine/ muscle spasms   Patient has failed these meds in past: none  Patient is currently controlled on the following medications:  - diclofenac 1% gel, 2 g two times daily as needed for arthritis (patient notes using very often)  - methocarbamol 500m83m.5 tablets as needed for muscle spasms (patient notes not using, but likes to keep on hand). - tramadol 50mg66mtablet three times daily as needed (patient notes only takes at night if needed. Last dose was over a week ago)   Plan Continue current medications.   Miscellaneous   Patient is currently on the following medications:  . Betamethasone cream 0.05% apply twice daily as needed . Amoxicillin 500 mg capsules PRN for dental procedures . Vitamin B12 5000 units three times weekly  We discussed:  Vitamin D and vitamin B12 levels and the appropriate supplementation.   Plan  Continue current medications   Medication Management   [facility] Patient organized medications: pill box for 2 weeks.  Patient denies barriers obtaining medications.   Patient has grant for tadalafil and [facility].    Plan: Continue current medication strategy.  Follow up Follow up visit with PharmD in 6 months.  MadelJeni SallesrmD Clinical  Pharmacist [place]

## 2020-05-08 NOTE — Telephone Encounter (Signed)
Attempted to call pt but unable to reach. Left message for her to return call.

## 2020-05-08 NOTE — Telephone Encounter (Signed)
Patient is returning phone call. Patient phone number is [phone].

## 2020-05-20 DIAGNOSIS — H26493 Other secondary cataract, bilateral: Secondary | ICD-10-CM | POA: Diagnosis not present

## 2020-05-20 DIAGNOSIS — H524 Presbyopia: Secondary | ICD-10-CM | POA: Diagnosis not present

## 2020-05-25 ENCOUNTER — Other Ambulatory Visit: Payer: Self-pay | Admitting: Internal Medicine

## 2020-06-04 DIAGNOSIS — L578 Other skin changes due to chronic exposure to nonionizing radiation: Secondary | ICD-10-CM | POA: Diagnosis not present

## 2020-06-04 DIAGNOSIS — L821 Other seborrheic keratosis: Secondary | ICD-10-CM | POA: Diagnosis not present

## 2020-06-04 DIAGNOSIS — L570 Actinic keratosis: Secondary | ICD-10-CM | POA: Diagnosis not present

## 2020-06-04 DIAGNOSIS — L820 Inflamed seborrheic keratosis: Secondary | ICD-10-CM | POA: Diagnosis not present

## 2020-06-15 DIAGNOSIS — G4733 Obstructive sleep apnea (adult) (pediatric): Secondary | ICD-10-CM | POA: Diagnosis not present

## 2020-06-15 DIAGNOSIS — J9610 Chronic respiratory failure, unspecified whether with hypoxia or hypercapnia: Secondary | ICD-10-CM | POA: Diagnosis not present

## 2020-07-02 ENCOUNTER — Encounter (HOSPITAL_COMMUNITY): Payer: PPO

## 2020-07-07 NOTE — Pre-Procedure Instructions (Addendum)
Your procedure is scheduled on Monday, November 8, from 12:25 PM- 2:23 PM.  Report to [facility] Main Entrance "A" at 10:15 A.M., and check in at the Admitting office.  Call this number if you have problems the morning of surgery:  [phone]  Call [phone] if you have any questions prior to your surgery date Monday-Friday 8am-4pm.    Remember:  Do not eat after midnight the night before your surgery.  You may drink clear liquids until 09:25 AM the morning of your surgery.   Clear liquids allowed are: Water, Non-Citrus Juices (without pulp), Carbonated Beverages, Clear Tea, Black Coffee Only, and Gatorade.   Enhanced Recovery after Surgery for Orthopedics Enhanced Recovery after Surgery is a protocol used to improve the stress on your body and your recovery after surgery.  Patient Instructions  . The night before surgery:  o No food after midnight. ONLY clear liquids after midnight  .  Marland Kitchen The day of surgery (if you do NOT have diabetes):  o Drink ONE (1) Pre-Surgery Clear Ensure by 09:25 AM the morning of surgery.   o This drink was given to you during your hospital pre-op appointment visit. o Nothing else to drink after completing the Pre-Surgery Clear Ensure.         If you have questions, please contact your surgeon's office.     Take these medicines the morning of surgery with A SIP OF WATER:   ADCIRCA   allopurinol (ZYLOPRIM)   atenolol (TENORMIN)  levothyroxine (SYNTHROID)  rosuvastatin (CRESTOR) IF NEEDED:  traMADol (ULTRAM)   *Follow your surgeon's instructions on when to stop XARELTO.  If no instructions were given by your surgeon then you will need to call the office to get those instructions.     As of today, STOP taking any Aspirin (unless otherwise instructed by your surgeon), diclofenac sodium (VOLTAREN) GEL, Aleve, Naproxen, Ibuprofen, Motrin, Advil, Goody's, BC's, all herbal medications, fish oil, and all vitamins.   The Morning of Surgery:                     Do not wear jewelry, make up, or nail polish.            Do not wear lotions, powders, perfumes, or deodorant.            Do not shave 48 hours prior to surgery.              Do not bring valuables to the hospital.            [facility] is not responsible for any belongings or valuables.  Do NOT Smoke (Tobacco/Vaping) or drink Alcohol 24 hours prior to your procedure.  If you use a CPAP at night, you may bring all equipment for your overnight stay.   Contacts, glasses, dentures or bridgework may not be worn into surgery.      For patients admitted to the hospital, discharge time will be determined by your treatment team.   Patients discharged the day of surgery will not be allowed to drive home, and someone needs to stay with them for 24 hours.                                    [place]- Preparing for Total Shoulder Arthroplasty   Before surgery, you can play an important role. Because skin is not sterile, your skin needs to  be as free of germs as possible. You can reduce the number of germs on your skin by using the following products. . Benzoyl Peroxide Gel o Reduces the number of germs present on the skin o Applied twice a day to shoulder area starting two days before surgery   . Chlorhexidine Gluconate (CHG) Soap o An antiseptic cleaner that kills germs and bonds with the skin to continue killing germs even after washing o Used for showering the night before surgery and morning of surgery   Oral Hygiene is also important to reduce your risk of infection.                                    Remember - BRUSH YOUR TEETH THE MORNING OF SURGERY WITH YOUR REGULAR TOOTHPASTE  ==================================================================  Please follow these instructions carefully:  BENZOYL PEROXIDE 5% GEL  Please do not use if you have an allergy to benzoyl peroxide.   If your skin becomes reddened/irritated stop using the benzoyl peroxide.  Starting two  days before surgery, apply as follows: 1. Apply benzoyl peroxide in the morning and at night. Apply after taking a shower. If you are not taking a shower clean entire shoulder front, back, and side along with the armpit with a clean wet washcloth.  2. Place a quarter-sized dollop on your shoulder and rub in thoroughly, making sure to cover the front, back, and side of your shoulder, along with the armpit.   2 days before ____ AM   ____ PM              1 day before ____ AM   ____ PM                            3.  Do this twice a day for two days.  (Last application is the night before surgery, AFTER using the CHG soap as described below).  4. Do NOT apply benzoyl peroxide gel on the day of surgery.  CHLORHEXIDINE GLUCONATE (CHG) SOAP  Please do not use if you have an allergy to CHG or antibacterial soaps. If your skin becomes reddened/irritated stop using the CHG.   Do not shave (including legs and underarms) for at least 48 hours prior to first CHG shower. It is OK to shave your face.  Starting the night before surgery, use CHG soap as follows:  1. Shower the NIGHT BEFORE SURGERY and MORNING OF SURGERY with CHG.  2. If you choose to wash your hair, wash your hair first as usual with your normal shampoo.  3. After shampooing, rinse your hair and body thoroughly to remove the shampoo.  4. Use CHG as you would any other liquid soap.  You can apply CHG directly to the skin and wash gently with a scrungie or a clean washcloth.  5. Apply the CHG soap to your body ONLY FROM THE NECK DOWN.  Do not use on open wounds or open sores.  Avoid contact with your eyes, ears, mouth, and genitals (private parts).  Wash face and genitals (private parts) with your normal soap.  6. Wash thoroughly, paying special attention to the area where your surgery will be performed.  7. Thoroughly rinse your body with warm water from the neck down.  8. DO NOT shower/wash with your normal soap after using and  rinsing off the CHG soap.  9. Pat yourself dry with a CLEAN TOWEL.   10.  Apply benzoyl peroxide.   11. Wear CLEAN PAJAMAS to bed the night before surgery; wear comfortable clothes the morning of surgery.  12. Place CLEAN SHEETS on your bed the night of your first shower and DO NOT SLEEP WITH PETS.  Day of Surgery: Shower as above Do not apply any deodorants/lotions.  Please wear clean clothes to the hospital/surgery center.   Remember to brush your teeth WITH YOUR REGULAR TOOTHPASTE.    Please read over the following fact sheets that you were given.
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
## 2020-07-08 ENCOUNTER — Other Ambulatory Visit: Payer: Self-pay

## 2020-07-08 ENCOUNTER — Encounter (HOSPITAL_COMMUNITY)
Admission: RE | Admit: 2020-07-08 | Discharge: 2020-07-08 | Disposition: A | Discharge status: Home / Self Care | Payer: PPO | Source: Ambulatory Visit | Attending: Orthopedic Surgery | Admitting: Orthopedic Surgery

## 2020-07-08 ENCOUNTER — Encounter (HOSPITAL_COMMUNITY): Payer: Self-pay

## 2020-07-08 DIAGNOSIS — Z79899 Other long term (current) drug therapy: Secondary | ICD-10-CM | POA: Insufficient documentation

## 2020-07-08 DIAGNOSIS — Z96611 Presence of right artificial shoulder joint: Secondary | ICD-10-CM | POA: Insufficient documentation

## 2020-07-08 DIAGNOSIS — Y838 Other surgical procedures as the cause of abnormal reaction of the patient, or of later complication, without mention of misadventure at the time of the procedure: Secondary | ICD-10-CM | POA: Diagnosis not present

## 2020-07-08 DIAGNOSIS — E039 Hypothyroidism, unspecified: Secondary | ICD-10-CM | POA: Diagnosis not present

## 2020-07-08 DIAGNOSIS — I5032 Chronic diastolic (congestive) heart failure: Secondary | ICD-10-CM | POA: Diagnosis not present

## 2020-07-08 DIAGNOSIS — Z792 Long term (current) use of antibiotics: Secondary | ICD-10-CM | POA: Insufficient documentation

## 2020-07-08 DIAGNOSIS — Z79890 Hormone replacement therapy (postmenopausal): Secondary | ICD-10-CM | POA: Diagnosis not present

## 2020-07-08 DIAGNOSIS — G4733 Obstructive sleep apnea (adult) (pediatric): Secondary | ICD-10-CM | POA: Insufficient documentation

## 2020-07-08 DIAGNOSIS — I2721 Secondary pulmonary arterial hypertension: Secondary | ICD-10-CM | POA: Diagnosis not present

## 2020-07-08 DIAGNOSIS — I110 Hypertensive heart disease with heart failure: Secondary | ICD-10-CM | POA: Insufficient documentation

## 2020-07-08 DIAGNOSIS — Z8673 Personal history of transient ischemic attack (TIA), and cerebral infarction without residual deficits: Secondary | ICD-10-CM | POA: Insufficient documentation

## 2020-07-08 DIAGNOSIS — Z01818 Encounter for other preprocedural examination: Secondary | ICD-10-CM | POA: Insufficient documentation

## 2020-07-08 DIAGNOSIS — E785 Hyperlipidemia, unspecified: Secondary | ICD-10-CM | POA: Diagnosis not present

## 2020-07-08 DIAGNOSIS — Z6841 Body Mass Index (BMI) 40.0 and over, adult: Secondary | ICD-10-CM | POA: Insufficient documentation

## 2020-07-08 DIAGNOSIS — Z791 Long term (current) use of non-steroidal anti-inflammatories (NSAID): Secondary | ICD-10-CM | POA: Insufficient documentation

## 2020-07-08 DIAGNOSIS — Z9989 Dependence on other enabling machines and devices: Secondary | ICD-10-CM | POA: Diagnosis not present

## 2020-07-08 DIAGNOSIS — Z7901 Long term (current) use of anticoagulants: Secondary | ICD-10-CM | POA: Insufficient documentation

## 2020-07-08 DIAGNOSIS — I4891 Unspecified atrial fibrillation: Secondary | ICD-10-CM | POA: Diagnosis not present

## 2020-07-08 DIAGNOSIS — T849XXA Unspecified complication of internal orthopedic prosthetic device, implant and graft, initial encounter: Secondary | ICD-10-CM | POA: Diagnosis not present

## 2020-07-08 DIAGNOSIS — J9611 Chronic respiratory failure with hypoxia: Secondary | ICD-10-CM | POA: Insufficient documentation

## 2020-07-08 DIAGNOSIS — K219 Gastro-esophageal reflux disease without esophagitis: Secondary | ICD-10-CM | POA: Insufficient documentation

## 2020-07-08 HISTORY — DX: Cardiac arrhythmia, unspecified: I49.9

## 2020-07-08 HISTORY — DX: Heart failure, unspecified: I50.9

## 2020-07-08 LAB — BASIC METABOLIC PANEL
Anion gap: 12 (ref 5–15)
BUN: 27 mg/dL — ABNORMAL HIGH (ref 8–23)
CO2: 24 mmol/L (ref 22–32)
Calcium: 9.5 mg/dL (ref 8.9–10.3)
Chloride: 105 mmol/L (ref 98–111)
Creatinine, Ser: 1.25 mg/dL — ABNORMAL HIGH (ref 0.44–1.00)
GFR, Estimated: 46 mL/min — ABNORMAL LOW (ref >60)
Glucose, Bld: 103 mg/dL — ABNORMAL HIGH (ref 70–99)
Potassium: 3.7 mmol/L (ref 3.5–5.1)
Sodium: 141 mmol/L (ref 135–145)

## 2020-07-08 LAB — CBC
HCT: 41.5 % (ref 36.0–46.0)
Hemoglobin: 13.2 g/dL (ref 12.0–15.0)
MCH: 32.6 pg (ref 26.0–34.0)
MCHC: 31.8 g/dL (ref 30.0–36.0)
MCV: 102.5 fL — ABNORMAL HIGH (ref 80.0–100.0)
Platelets: 131 10*3/uL — ABNORMAL LOW (ref 150–400)
RBC: 4.05 MIL/uL (ref 3.87–5.11)
RDW: 14.4 % (ref 11.5–15.5)
WBC: 7.4 10*3/uL (ref 4.0–10.5)
nRBC: 0 % (ref 0.0–0.2)

## 2020-07-08 LAB — SURGICAL PCR SCREEN
MRSA, PCR: NEGATIVE
Staphylococcus aureus: NEGATIVE

## 2020-07-08 NOTE — Progress Notes (Addendum)
PCP - Standley Brooking. Regis Bill, MD Cardiologist - Loralie Champagne, MD Pulmonologist- [name], NP   PPM/ICD - Denies  Chest x-ray - 04/22/20 EKG - 07/08/20 Stress Test - 08/17/16 ECHO - 04/23/20 Cardiac Cath - 11/07/16  Sleep Study - Yes CPAP - Yes  Patient denies being diabetic.  Blood Thinner Instructions: Per patient, Last dose Xarelto will be 07/09/20 Aspirin Instructions: N/A  ERAS Protcol - Yes PRE-SURGERY Ensure or G2- Ensure given  Shoulder Sx prep- Benzoyl Peroxide cream given.  COVID TEST- 07/10/20   Anesthesia review: Yes, cardiac & pulm hx.  Patient denies shortness of breath, fever, cough and chest pain at PAT appointment   All instructions explained to the patient, with a verbal understanding of the material. Patient agrees to go over the instructions while at home for a better understanding. Patient also instructed to self quarantine after being tested for COVID-19. The opportunity to ask questions was provided.
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
## 2020-07-09 NOTE — Anesthesia Preprocedure Evaluation (Addendum)
Anesthesia Evaluation  Patient identified by MRN, date of birth, ID band Patient awake    Reviewed: Allergy & Precautions, H&P , NPO status , Patient's Chart, lab work & pertinent test results  Airway Mallampati: II   Neck ROM: full    Dental   Pulmonary sleep apnea ,    breath sounds clear to auscultation       Cardiovascular hypertension, +CHF  + dysrhythmias Atrial Fibrillation  Rhythm:regular Rate:Normal     Neuro/Psych  Neuromuscular disease CVA    GI/Hepatic GERD  ,  Endo/Other  Morbid obesity  Renal/GU      Musculoskeletal  (+) Arthritis ,   Abdominal   Peds  Hematology   Anesthesia Other Findings   Reproductive/Obstetrics                            Anesthesia Physical Anesthesia Plan  ASA: III  Anesthesia Plan: General   Post-op Pain Management:  Regional for Post-op pain   Induction: Intravenous  PONV Risk Score and Plan: 3 and Ondansetron, Dexamethasone and Treatment may vary due to age or medical condition  Airway Management Planned: Oral ETT  Additional Equipment:   Intra-op Plan:   Post-operative Plan: Extubation in OR  Informed Consent: I have reviewed the patients History and Physical, chart, labs and discussed the procedure including the risks, benefits and alternatives for the proposed anesthesia with the patient or authorized representative who has indicated his/her understanding and acceptance.       Plan Discussed with: CRNA, Anesthesiologist and Surgeon  Anesthesia Plan Comments: (PAT note written 07/09/2020 by Myra Gianotti, PA-C. )       Anesthesia Quick Evaluation

## 2020-07-10 ENCOUNTER — Other Ambulatory Visit (HOSPITAL_COMMUNITY)
Admission: RE | Admit: 2020-07-10 | Discharge: 2020-07-10 | Disposition: A | Discharge status: Home / Self Care | Payer: PPO | Source: Ambulatory Visit | Attending: Orthopedic Surgery | Admitting: Orthopedic Surgery

## 2020-07-10 DIAGNOSIS — Z20822 Contact with and (suspected) exposure to covid-19: Secondary | ICD-10-CM | POA: Insufficient documentation

## 2020-07-10 DIAGNOSIS — Z01812 Encounter for preprocedural laboratory examination: Secondary | ICD-10-CM | POA: Insufficient documentation

## 2020-07-10 LAB — SARS CORONAVIRUS 2 (TAT 6-24 HRS): SARS Coronavirus 2: NEGATIVE

## 2020-07-13 ENCOUNTER — Inpatient Hospital Stay (HOSPITAL_COMMUNITY): Payer: PPO

## 2020-07-13 ENCOUNTER — Other Ambulatory Visit: Payer: Self-pay

## 2020-07-13 ENCOUNTER — Ambulatory Visit (HOSPITAL_COMMUNITY): Payer: PPO | Admitting: Anesthesiology

## 2020-07-13 ENCOUNTER — Encounter (HOSPITAL_COMMUNITY)
Admission: RE | Disposition: A | Discharge status: Home / Self Care | Payer: Self-pay | Source: Home / Self Care | Attending: Orthopedic Surgery

## 2020-07-13 ENCOUNTER — Encounter (HOSPITAL_COMMUNITY): Payer: Self-pay | Admitting: Orthopedic Surgery

## 2020-07-13 ENCOUNTER — Observation Stay (HOSPITAL_COMMUNITY)
Admission: RE | Admit: 2020-07-13 | Discharge: 2020-07-14 | Disposition: A | Discharge status: Home / Self Care | Payer: PPO | Attending: Orthopedic Surgery | Admitting: Orthopedic Surgery

## 2020-07-13 ENCOUNTER — Ambulatory Visit (HOSPITAL_COMMUNITY): Payer: PPO | Admitting: Vascular Surgery

## 2020-07-13 DIAGNOSIS — I110 Hypertensive heart disease with heart failure: Secondary | ICD-10-CM | POA: Insufficient documentation

## 2020-07-13 DIAGNOSIS — Z96653 Presence of artificial knee joint, bilateral: Secondary | ICD-10-CM | POA: Insufficient documentation

## 2020-07-13 DIAGNOSIS — Z7901 Long term (current) use of anticoagulants: Secondary | ICD-10-CM | POA: Diagnosis not present

## 2020-07-13 DIAGNOSIS — G8918 Other acute postprocedural pain: Secondary | ICD-10-CM | POA: Diagnosis not present

## 2020-07-13 DIAGNOSIS — I509 Heart failure, unspecified: Secondary | ICD-10-CM | POA: Diagnosis not present

## 2020-07-13 DIAGNOSIS — Z96612 Presence of left artificial shoulder joint: Secondary | ICD-10-CM | POA: Insufficient documentation

## 2020-07-13 DIAGNOSIS — Z96611 Presence of right artificial shoulder joint: Secondary | ICD-10-CM

## 2020-07-13 DIAGNOSIS — Z79899 Other long term (current) drug therapy: Secondary | ICD-10-CM | POA: Diagnosis not present

## 2020-07-13 DIAGNOSIS — M25311 Other instability, right shoulder: Secondary | ICD-10-CM | POA: Diagnosis not present

## 2020-07-13 DIAGNOSIS — I5032 Chronic diastolic (congestive) heart failure: Secondary | ICD-10-CM | POA: Diagnosis not present

## 2020-07-13 DIAGNOSIS — T84038A Mechanical loosening of other internal prosthetic joint, initial encounter: Secondary | ICD-10-CM | POA: Diagnosis not present

## 2020-07-13 DIAGNOSIS — T84098A Other mechanical complication of other internal joint prosthesis, initial encounter: Principal | ICD-10-CM | POA: Insufficient documentation

## 2020-07-13 DIAGNOSIS — E039 Hypothyroidism, unspecified: Secondary | ICD-10-CM | POA: Diagnosis not present

## 2020-07-13 DIAGNOSIS — Z471 Aftercare following joint replacement surgery: Secondary | ICD-10-CM | POA: Diagnosis not present

## 2020-07-13 DIAGNOSIS — M25511 Pain in right shoulder: Secondary | ICD-10-CM | POA: Diagnosis present

## 2020-07-13 HISTORY — PX: REVISION TOTAL SHOULDER TO REVERSE TOTAL SHOULDER: SHX6313

## 2020-07-13 LAB — TYPE AND SCREEN
ABO/RH(D): O POS
Antibody Screen: NEGATIVE

## 2020-07-13 LAB — GLUCOSE, CAPILLARY: Glucose-Capillary: 97 mg/dL (ref 70–99)

## 2020-07-13 SURGERY — REVISION, REVERSE TOTAL ARTHROPLASTY, SHOULDER
Anesthesia: General | Site: Shoulder | Laterality: Right

## 2020-07-13 MED ORDER — DOCUSATE SODIUM 100 MG PO CAPS
100.0000 mg | ORAL_CAPSULE | Freq: Two times a day (BID) | ORAL | Status: DC
  Administered 2020-07-13: 100 mg via ORAL
  Filled 2020-07-13: qty 1

## 2020-07-13 MED ORDER — CLINDAMYCIN PHOSPHATE 900 MG/50ML IV SOLN
900.0000 mg | INTRAVENOUS | Status: AC
  Administered 2020-07-13: 900 mg via INTRAVENOUS
  Filled 2020-07-13: qty 50

## 2020-07-13 MED ORDER — BISACODYL 10 MG RE SUPP: 10.0000 mg | Freq: Every day | RECTAL | Status: DC | PRN

## 2020-07-13 MED ORDER — DEXAMETHASONE SODIUM PHOSPHATE 10 MG/ML IJ SOLN
INTRAMUSCULAR | Status: DC | PRN
  Administered 2020-07-13: 5 mg via INTRAVENOUS

## 2020-07-13 MED ORDER — ORAL CARE MOUTH RINSE: 15.0000 mL | Freq: Once | OROMUCOSAL | Status: AC

## 2020-07-13 MED ORDER — ALLOPURINOL 100 MG PO TABS
100.0000 mg | ORAL_TABLET | Freq: Two times a day (BID) | ORAL | Status: DC
  Administered 2020-07-13: 100 mg via ORAL
  Filled 2020-07-13 (×4): qty 1

## 2020-07-13 MED ORDER — PROPOFOL 10 MG/ML IV BOLUS
INTRAVENOUS | Status: DC | PRN
  Administered 2020-07-13: 120 mg via INTRAVENOUS

## 2020-07-13 MED ORDER — ONDANSETRON HCL 4 MG/2ML IJ SOLN
INTRAMUSCULAR | Status: DC | PRN
  Administered 2020-07-13: 4 mg via INTRAVENOUS

## 2020-07-13 MED ORDER — ROSUVASTATIN CALCIUM 5 MG PO TABS
5.0000 mg | ORAL_TABLET | Freq: Every day | ORAL | Status: DC
  Administered 2020-07-13: 5 mg via ORAL
  Filled 2020-07-13: qty 1

## 2020-07-13 MED ORDER — ROCURONIUM BROMIDE 10 MG/ML (PF) SYRINGE
PREFILLED_SYRINGE | INTRAVENOUS | Status: AC
  Filled 2020-07-13: qty 10

## 2020-07-13 MED ORDER — FENTANYL CITRATE (PF) 100 MCG/2ML IJ SOLN
INTRAMUSCULAR | Status: AC
  Administered 2020-07-13: 50 ug via INTRAVENOUS
  Filled 2020-07-13: qty 2

## 2020-07-13 MED ORDER — DEXAMETHASONE SODIUM PHOSPHATE 10 MG/ML IJ SOLN
INTRAMUSCULAR | Status: AC
  Filled 2020-07-13: qty 1

## 2020-07-13 MED ORDER — ACETAZOLAMIDE 250 MG PO TABS
250.0000 mg | ORAL_TABLET | Freq: Every day | ORAL | Status: DC
  Administered 2020-07-13: 250 mg via ORAL
  Filled 2020-07-13 (×2): qty 1

## 2020-07-13 MED ORDER — FENTANYL CITRATE (PF) 250 MCG/5ML IJ SOLN
INTRAMUSCULAR | Status: DC | PRN
  Administered 2020-07-13: 50 ug via INTRAVENOUS

## 2020-07-13 MED ORDER — ONDANSETRON HCL 4 MG PO TABS: 4.0000 mg | ORAL_TABLET | Freq: Four times a day (QID) | ORAL | Status: DC | PRN

## 2020-07-13 MED ORDER — RIVAROXABAN 20 MG PO TABS
20.0000 mg | ORAL_TABLET | Freq: Every day | ORAL | Status: DC
  Filled 2020-07-13: qty 1

## 2020-07-13 MED ORDER — ATENOLOL 50 MG PO TABS: 50.0000 mg | ORAL_TABLET | Freq: Every day | ORAL | Status: DC

## 2020-07-13 MED ORDER — HYDROMORPHONE HCL 1 MG/ML IJ SOLN: 0.5000 mg | INTRAMUSCULAR | Status: DC | PRN

## 2020-07-13 MED ORDER — DOXYCYCLINE HYCLATE 50 MG PO CAPS: 100.0000 mg | ORAL_CAPSULE | Freq: Two times a day (BID) | ORAL | 0 refills | Status: AC

## 2020-07-13 MED ORDER — OXYCODONE-ACETAMINOPHEN 5-325 MG PO TABS
1.0000 | ORAL_TABLET | ORAL | 0 refills | Status: DC | PRN
Start: 2020-07-13 — End: 2020-11-04

## 2020-07-13 MED ORDER — GLYCOPYRROLATE 0.2 MG/ML IJ SOLN
INTRAMUSCULAR | Status: DC | PRN
  Administered 2020-07-13: .2 mg via INTRAVENOUS

## 2020-07-13 MED ORDER — BUPIVACAINE-EPINEPHRINE (PF) 0.25% -1:200000 IJ SOLN
INTRAMUSCULAR | Status: AC
  Filled 2020-07-13: qty 30

## 2020-07-13 MED ORDER — LACTATED RINGERS IV SOLN: INTRAVENOUS | Status: DC | PRN

## 2020-07-13 MED ORDER — BUPIVACAINE-EPINEPHRINE 0.25% -1:200000 IJ SOLN
INTRAMUSCULAR | Status: DC | PRN
  Administered 2020-07-13: 10 mL

## 2020-07-13 MED ORDER — CHLORHEXIDINE GLUCONATE 0.12 % MT SOLN
15.0000 mL | Freq: Once | OROMUCOSAL | Status: AC
  Administered 2020-07-13: 15 mL via OROMUCOSAL
  Filled 2020-07-13: qty 15

## 2020-07-13 MED ORDER — METOCLOPRAMIDE HCL 5 MG/ML IJ SOLN: 5.0000 mg | Freq: Three times a day (TID) | INTRAMUSCULAR | Status: DC | PRN

## 2020-07-13 MED ORDER — MIDAZOLAM HCL 2 MG/2ML IJ SOLN
INTRAMUSCULAR | Status: AC
  Filled 2020-07-13: qty 2

## 2020-07-13 MED ORDER — LIDOCAINE 2% (20 MG/ML) 5 ML SYRINGE
INTRAMUSCULAR | Status: DC | PRN
  Administered 2020-07-13: 50 mg via INTRAVENOUS

## 2020-07-13 MED ORDER — DICLOFENAC SODIUM 1 % EX GEL
2.0000 g | Freq: Two times a day (BID) | CUTANEOUS | Status: DC | PRN
  Filled 2020-07-13: qty 100

## 2020-07-13 MED ORDER — TRAMADOL HCL 50 MG PO TABS: 50.0000 mg | ORAL_TABLET | Freq: Four times a day (QID) | ORAL | Status: DC | PRN

## 2020-07-13 MED ORDER — ONDANSETRON HCL 4 MG/2ML IJ SOLN: 4.0000 mg | Freq: Four times a day (QID) | INTRAMUSCULAR | Status: DC | PRN

## 2020-07-13 MED ORDER — LEVOTHYROXINE SODIUM 75 MCG PO TABS
75.0000 ug | ORAL_TABLET | Freq: Every day | ORAL | Status: DC
  Administered 2020-07-14: 75 ug via ORAL
  Filled 2020-07-13: qty 1

## 2020-07-13 MED ORDER — FENTANYL CITRATE (PF) 250 MCG/5ML IJ SOLN
INTRAMUSCULAR | Status: AC
  Filled 2020-07-13: qty 5

## 2020-07-13 MED ORDER — PHENOL 1.4 % MT LIQD: 1.0000 | OROMUCOSAL | Status: DC | PRN

## 2020-07-13 MED ORDER — METHOCARBAMOL 500 MG PO TABS: ORAL_TABLET | ORAL | 1 refills | Status: DC

## 2020-07-13 MED ORDER — SUGAMMADEX SODIUM 200 MG/2ML IV SOLN
INTRAVENOUS | Status: DC | PRN
  Administered 2020-07-13: 200 mg via INTRAVENOUS

## 2020-07-13 MED ORDER — POTASSIUM CHLORIDE ER 10 MEQ PO TBCR
20.0000 meq | EXTENDED_RELEASE_TABLET | Freq: Two times a day (BID) | ORAL | Status: DC
  Administered 2020-07-13: 20 meq via ORAL
  Filled 2020-07-13 (×4): qty 2

## 2020-07-13 MED ORDER — METHOCARBAMOL 500 MG PO TABS: 500.0000 mg | ORAL_TABLET | Freq: Four times a day (QID) | ORAL | Status: DC | PRN

## 2020-07-13 MED ORDER — FENTANYL CITRATE (PF) 100 MCG/2ML IJ SOLN: 50.0000 ug | Freq: Once | INTRAMUSCULAR | Status: AC

## 2020-07-13 MED ORDER — PHENYLEPHRINE HCL-NACL 10-0.9 MG/250ML-% IV SOLN
INTRAVENOUS | Status: DC | PRN
  Administered 2020-07-13: 25 ug/min via INTRAVENOUS

## 2020-07-13 MED ORDER — POLYETHYLENE GLYCOL 3350 17 G PO PACK: 17.0000 g | PACK | Freq: Every day | ORAL | Status: DC | PRN

## 2020-07-13 MED ORDER — ACETAMINOPHEN 325 MG PO TABS: 325.0000 mg | ORAL_TABLET | Freq: Four times a day (QID) | ORAL | Status: DC | PRN

## 2020-07-13 MED ORDER — BUPIVACAINE HCL (PF) 0.5 % IJ SOLN
INTRAMUSCULAR | Status: DC | PRN
  Administered 2020-07-13: 15 mL via PERINEURAL

## 2020-07-13 MED ORDER — VITAMIN D 25 MCG (1000 UNIT) PO TABS: 5000.0000 [IU] | ORAL_TABLET | ORAL | Status: DC

## 2020-07-13 MED ORDER — TORSEMIDE 20 MG PO TABS
20.0000 mg | ORAL_TABLET | Freq: Every day | ORAL | Status: DC
  Filled 2020-07-13: qty 1

## 2020-07-13 MED ORDER — LACTATED RINGERS IV SOLN: INTRAVENOUS | Status: DC

## 2020-07-13 MED ORDER — FENTANYL CITRATE (PF) 100 MCG/2ML IJ SOLN: 25.0000 ug | INTRAMUSCULAR | Status: DC | PRN

## 2020-07-13 MED ORDER — VITAMIN B-12 1000 MCG PO TABS
5000.0000 ug | ORAL_TABLET | ORAL | Status: DC
  Administered 2020-07-13: 5000 ug via ORAL
  Filled 2020-07-13: qty 5

## 2020-07-13 MED ORDER — METHOCARBAMOL 1000 MG/10ML IJ SOLN
500.0000 mg | Freq: Four times a day (QID) | INTRAVENOUS | Status: DC | PRN
  Filled 2020-07-13: qty 5

## 2020-07-13 MED ORDER — ONDANSETRON HCL 4 MG/2ML IJ SOLN
INTRAMUSCULAR | Status: AC
  Filled 2020-07-13: qty 2

## 2020-07-13 MED ORDER — 0.9 % SODIUM CHLORIDE (POUR BTL) OPTIME
TOPICAL | Status: DC | PRN
  Administered 2020-07-13: 1000 mL

## 2020-07-13 MED ORDER — METOCLOPRAMIDE HCL 5 MG PO TABS: 5.0000 mg | ORAL_TABLET | Freq: Three times a day (TID) | ORAL | Status: DC | PRN

## 2020-07-13 MED ORDER — SODIUM CHLORIDE 0.9 % IV SOLN: INTRAVENOUS | Status: DC

## 2020-07-13 MED ORDER — MENTHOL 3 MG MT LOZG: 1.0000 | LOZENGE | OROMUCOSAL | Status: DC | PRN

## 2020-07-13 MED ORDER — PROPOFOL 10 MG/ML IV BOLUS
INTRAVENOUS | Status: AC
  Filled 2020-07-13: qty 20

## 2020-07-13 MED ORDER — OXYCODONE HCL 5 MG PO TABS: 5.0000 mg | ORAL_TABLET | ORAL | Status: DC | PRN

## 2020-07-13 MED ORDER — ROCURONIUM BROMIDE 10 MG/ML (PF) SYRINGE
PREFILLED_SYRINGE | INTRAVENOUS | Status: DC | PRN
  Administered 2020-07-13: 60 mg via INTRAVENOUS

## 2020-07-13 MED ORDER — TORSEMIDE 20 MG PO TABS
40.0000 mg | ORAL_TABLET | Freq: Every morning | ORAL | Status: DC
  Filled 2020-07-13: qty 2

## 2020-07-13 MED ORDER — OXYCODONE HCL 5 MG PO TABS: 5.0000 mg | ORAL_TABLET | Freq: Once | ORAL | Status: DC | PRN

## 2020-07-13 MED ORDER — BUPIVACAINE LIPOSOME 1.3 % IJ SUSP
INTRAMUSCULAR | Status: DC | PRN
  Administered 2020-07-13: 10 mL via PERINEURAL

## 2020-07-13 MED ORDER — CLINDAMYCIN PHOSPHATE 600 MG/50ML IV SOLN
600.0000 mg | Freq: Four times a day (QID) | INTRAVENOUS | Status: AC
  Administered 2020-07-13 – 2020-07-14 (×3): 600 mg via INTRAVENOUS
  Filled 2020-07-13 (×3): qty 50

## 2020-07-13 MED ORDER — OXYCODONE HCL 5 MG/5ML PO SOLN: 5.0000 mg | Freq: Once | ORAL | Status: DC | PRN

## 2020-07-13 MED ORDER — TADALAFIL (PAH) 20 MG PO TABS: 40.0000 mg | ORAL_TABLET | Freq: Every day | ORAL | Status: DC

## 2020-07-13 MED ORDER — LIDOCAINE 2% (20 MG/ML) 5 ML SYRINGE
INTRAMUSCULAR | Status: AC
  Filled 2020-07-13: qty 5

## 2020-07-13 SURGICAL SUPPLY — 80 items
BIT DRILL 170X2.5X (BIT) ×1 IMPLANT
BIT DRILL 5/64X5 DISP (BIT) ×2 IMPLANT
BIT DRL 170X2.5X (BIT) ×1
BLADE SAG 18X100X1.27 (BLADE) ×2 IMPLANT
BONE CANC CHIPS 20CC PCAN1/4 (Bone Implant) ×2 IMPLANT
CHIPS CANC BONE 20CC PCAN1/4 (Bone Implant) ×1 IMPLANT
CONTROL EPI SZ1 (Orthopedic Implant) ×1 IMPLANT
COVER SURGICAL LIGHT HANDLE (MISCELLANEOUS) ×2 IMPLANT
COVER WAND RF STERILE (DRAPES) IMPLANT
CTR EPI SZ1 (Orthopedic Implant) ×2 IMPLANT
DRAPE INCISE IOBAN 66X45 STRL (DRAPES) ×2 IMPLANT
DRAPE ORTHO SPLIT 77X108 STRL (DRAPES) ×4
DRAPE SURG ORHT 6 SPLT 77X108 (DRAPES) ×2 IMPLANT
DRAPE U-SHAPE 47X51 STRL (DRAPES) ×2 IMPLANT
DRILL 2.5 (BIT) ×2
DRSG ADAPTIC 3X8 NADH LF (GAUZE/BANDAGES/DRESSINGS) ×2 IMPLANT
DRSG PAD ABDOMINAL 8X10 ST (GAUZE/BANDAGES/DRESSINGS) ×2 IMPLANT
DRSG TEGADERM 4X4.75 (GAUZE/BANDAGES/DRESSINGS) ×2 IMPLANT
DURAPREP 26ML APPLICATOR (WOUND CARE) ×2 IMPLANT
ELECT BLADE 4.0 EZ CLEAN MEGAD (MISCELLANEOUS) ×2
ELECT NEEDLE TIP 2.8 STRL (NEEDLE) ×2 IMPLANT
ELECT REM PT RETURN 9FT ADLT (ELECTROSURGICAL) ×2
ELECTRODE BLDE 4.0 EZ CLN MEGD (MISCELLANEOUS) ×1 IMPLANT
ELECTRODE REM PT RTRN 9FT ADLT (ELECTROSURGICAL) ×1 IMPLANT
GAUZE SPONGE 4X4 12PLY STRL (GAUZE/BANDAGES/DRESSINGS) ×2 IMPLANT
GLENOSPHERE DELTA XTEND LAT 38 (Miscellaneous) ×2 IMPLANT
GLOVE BIOGEL PI ORTHO PRO 7.5 (GLOVE) ×1
GLOVE BIOGEL PI ORTHO PRO SZ8 (GLOVE) ×1
GLOVE ORTHO TXT STRL SZ7.5 (GLOVE) ×2 IMPLANT
GLOVE PI ORTHO PRO STRL 7.5 (GLOVE) ×1 IMPLANT
GLOVE PI ORTHO PRO STRL SZ8 (GLOVE) ×1 IMPLANT
GLOVE SURG ORTHO 8.5 STRL (GLOVE) ×2 IMPLANT
GOWN STRL REUS W/ TWL LRG LVL3 (GOWN DISPOSABLE) ×1 IMPLANT
GOWN STRL REUS W/ TWL XL LVL3 (GOWN DISPOSABLE) ×2 IMPLANT
GOWN STRL REUS W/TWL LRG LVL3 (GOWN DISPOSABLE) ×2
GOWN STRL REUS W/TWL XL LVL3 (GOWN DISPOSABLE) ×4
HUMERAL SPACER (Trauma) ×2 IMPLANT
KIT BASIN OR (CUSTOM PROCEDURE TRAY) ×2 IMPLANT
KIT TURNOVER KIT B (KITS) ×2 IMPLANT
MANIFOLD NEPTUNE II (INSTRUMENTS) ×2 IMPLANT
METAGLENE DELTA EXTEND (Trauma) ×1 IMPLANT
METAGLENE DXTEND (Trauma) ×2 IMPLANT
NEEDLE 1/2 CIR MAYO (NEEDLE) ×2 IMPLANT
NEEDLE HYPO 25GX1X1/2 BEV (NEEDLE) ×2 IMPLANT
NS IRRIG 1000ML POUR BTL (IV SOLUTION) ×2 IMPLANT
PACK SHOULDER (CUSTOM PROCEDURE TRAY) ×2 IMPLANT
PAD ARMBOARD 7.5X6 YLW CONV (MISCELLANEOUS) ×4 IMPLANT
PIN GUIDE 1.2 (PIN) ×2 IMPLANT
PIN GUIDE GLENOPHERE 1.5MX300M (PIN) ×2 IMPLANT
PIN METAGLENE 2.5 (PIN) ×2 IMPLANT
RESTRAINT HEAD UNIVERSAL NS (MISCELLANEOUS) ×2 IMPLANT
SCREW 4.5X18MM (Screw) ×2 IMPLANT
SCREW 4.5X24MM (Screw) ×2 IMPLANT
SCREW BN 18X4.5XSTRL SHLDR (Screw) ×1 IMPLANT
SCREW BN 24X4.5XLCK STRL (Screw) ×1 IMPLANT
SCREW LOCK DELTA XTEND 4.5X30 (Screw) ×2 IMPLANT
SLING ARM IMMOBILIZER LRG (SOFTGOODS) ×2 IMPLANT
SPACER 38 PLUS 3 (Spacer) ×2 IMPLANT
SPACER HUMERAL (Trauma) ×1 IMPLANT
SPONGE LAP 18X18 RF (DISPOSABLE) IMPLANT
SPONGE LAP 4X18 RFD (DISPOSABLE) ×2 IMPLANT
STEM STD GLOBAL SZ 6 (Stem) ×2 IMPLANT
STRIP CLOSURE SKIN 1/2X4 (GAUZE/BANDAGES/DRESSINGS) ×2 IMPLANT
SUCTION FRAZIER HANDLE 10FR (MISCELLANEOUS) ×2
SUCTION TUBE FRAZIER 10FR DISP (MISCELLANEOUS) ×1 IMPLANT
SUT FIBERWIRE #2 38 T-5 BLUE (SUTURE) ×4
SUT MNCRL AB 4-0 PS2 18 (SUTURE) ×2 IMPLANT
SUT VIC AB 0 CT2 27 (SUTURE) ×2 IMPLANT
SUT VIC AB 2-0 CT1 27 (SUTURE) ×2
SUT VIC AB 2-0 CT1 TAPERPNT 27 (SUTURE) ×1 IMPLANT
SUT VICRYL 0 CT 1 36IN (SUTURE) ×2 IMPLANT
SUTURE FIBERWR #2 38 T-5 BLUE (SUTURE) ×2 IMPLANT
SWAB COLLECTION DEVICE MRSA (MISCELLANEOUS) ×2 IMPLANT
SWAB CULTURE ESWAB REG 1ML (MISCELLANEOUS) ×2 IMPLANT
SYR BULB IRRIG 60ML STRL (SYRINGE) ×2 IMPLANT
SYR CONTROL 10ML LL (SYRINGE) ×2 IMPLANT
TOWEL GREEN STERILE (TOWEL DISPOSABLE) ×2 IMPLANT
TOWEL GREEN STERILE FF (TOWEL DISPOSABLE) ×2 IMPLANT
TOWER CARTRIDGE SMART MIX (DISPOSABLE) IMPLANT
YANKAUER SUCT BULB TIP NO VENT (SUCTIONS) ×2 IMPLANT

## 2020-07-13 NOTE — Anesthesia Procedure Notes (Addendum)
Procedure Name: Intubation Date/Time: 07/13/2020 12:26 PM Performed by: Kyung Rudd, CRNA Pre-anesthesia Checklist: Patient identified, Emergency Drugs available, Suction available and Patient being monitored Patient Re-evaluated:Patient Re-evaluated prior to induction Oxygen Delivery Method: Circle System Utilized Preoxygenation: Pre-oxygenation with 100% oxygen Induction Type: IV induction Ventilation: Mask ventilation without difficulty and Oral airway inserted - appropriate to patient size Laryngoscope Size: Glidescope and 3 Tube type: Oral Tube size: 7.0 mm Number of attempts: 1 Airway Equipment and Method: Stylet Placement Confirmation: ETT inserted through vocal cords under direct vision,  positive ETCO2 and breath sounds checked- equal and bilateral Secured at: 22 cm Tube secured with: Tape Dental Injury: Teeth and Oropharynx as per pre-operative assessment

## 2020-07-13 NOTE — Transfer of Care (Signed)
Immediate Anesthesia Transfer of Care Note  Patient: GLYNIS HUNSUCKER  Procedure(s) Performed: Right shoulder reverse total revision (Right Shoulder)  Patient Location: PACU  Anesthesia Type:GA combined with regional for post-op pain  Level of Consciousness: awake, alert  and oriented  Airway & Oxygen Therapy: Patient Spontanous Breathing and Patient connected to nasal cannula oxygen  Post-op Assessment: Report given to RN and Post -op Vital signs reviewed and stable  Post vital signs: Reviewed and stable  Last Vitals:  Vitals Value Taken Time  BP 132/66 07/13/20 1453  Temp 36.1 C 07/13/20 1453  Pulse 76 07/13/20 1454  Resp 17 07/13/20 1454  SpO2 95 % 07/13/20 1454  Vitals shown include unvalidated device data.  Last Pain:  Vitals:   07/13/20 1102  TempSrc:   PainSc: 0-No pain      Patients Stated Pain Goal: 4 (18/84/16 6063)  Complications: No complications documented.

## 2020-07-13 NOTE — Brief Op Note (Signed)
07/13/2020  2:49 PM  PATIENT:  [name]  69 y.o. female  PRE-OPERATIVE DIAGNOSIS:  right shoulder failed total shoulder  POST-OPERATIVE DIAGNOSIS:  right shoulder failed total shoulder  PROCEDURE:  Procedure(s) with comments: Right shoulder reverse total revision (Right) - interscalene block DePuy Delta Xtend  SURGEON:  Surgeon(s) and Role:    Netta Cedars, MD - Primary  PHYSICIAN ASSISTANT:   ASSISTANTS: [place] Bruns, PA-C   ANESTHESIA:   regional and general  EBL:  200 mL   BLOOD ADMINISTERED:none  DRAINS: none   LOCAL MEDICATIONS USED:  MARCAINE     SPECIMEN:  Right shoulder fluid  DISPOSITION OF SPECIMEN:  micro  COUNTS:  YES  TOURNIQUET:  * No tourniquets in log *  DICTATION: .Other Dictation: Dictation Number [id number]  PLAN OF CARE: Admit for overnight observation  PATIENT DISPOSITION:  PACU - hemodynamically stable.   Delay start of Pharmacological VTE agent (>24hrs) due to surgical blood loss or risk of bleeding: no

## 2020-07-13 NOTE — Anesthesia Procedure Notes (Signed)
Anesthesia Regional Block: Interscalene brachial plexus block   Pre-Anesthetic Checklist: ,, timeout performed, Correct Patient, Correct Site, Correct Laterality, Correct Procedure, Correct Position, site marked, Risks and benefits discussed,  Surgical consent,  Pre-op evaluation,  At surgeon's request and post-op pain management  Laterality: Right  Prep: chloraprep       Needles:  Injection technique: Single-shot  Needle Type: Echogenic Stimulator Needle     Needle Length: 5cm  Needle Gauge: 22     Additional Needles:   Procedures:, nerve stimulator,,,,,,,   Nerve Stimulator or Paresthesia:  Response: biceps flexion, 0.45 mA,   Additional Responses:   Narrative:  Start time: 07/13/2020 11:33 AM End time: 07/13/2020 11:39 AM Injection made incrementally with aspirations every 5 mL.  Performed by: Personally  Anesthesiologist: Albertha Ghee, MD  Additional Notes: Functioning IV was confirmed and monitors were applied.  A 62m 22ga Arrow echogenic stimulator needle was used. Sterile prep and drape,hand hygiene and sterile gloves were used.  Negative aspiration and negative test dose prior to incremental administration of local anesthetic. The patient tolerated the procedure well.  Ultrasound guidance: relevent anatomy identified, needle position confirmed, local anesthetic spread visualized around nerve(s), vascular puncture avoided.  Image printed for medical record.

## 2020-07-13 NOTE — Op Note (Signed)
NAME: [name], ESHAM MEDICAL RECORD FI:[id number] ACCOUNT [id number] DATE OF BIRTH:27-Jun-1951 FACILITY: [facility] LOCATION: [facility] PHYSICIAN:STEVEN Orlena Sheldon, MD  OPERATIVE REPORT  DATE OF PROCEDURE:  07/13/2020  PREOPERATIVE DIAGNOSIS:  Right failed total shoulder replacement.  POSTOPERATIVE DIAGNOSIS:  Right failed total shoulder replacement.  PROCEDURE PERFORMED:  Removal of right total shoulder replacement components with revision arthroplasty to reverse total shoulder replacement using DePuy Delta Xtend prosthesis.  ATTENDING SURGEON:  Esmond Plants, MD  ASSISTANT:  Darol Destine, [place], who was scrubbed during the entire procedure and necessary for satisfactory completion of surgery.  ANESTHESIA:  General anesthesia was used plus interscalene block.  ESTIMATED BLOOD LOSS:  300 mL.  FLUID REPLACEMENT:  1500 mL crystalloid.  INSTRUMENT COUNTS:  Correct.  COMPLICATIONS:  No complications.  ANTIBIOTICS:  Perioperative antibiotics were given.  INDICATIONS:  The patient is a 69 year old female who presents with a history of worsening right shoulder pain and dysfunction secondary to suspected failed primary shoulder arthroplasty from 10 years ago.  The patient presents with x-rays concerning for  a loose glenoid and also poor function consistent with rotator cuff insufficiency.  I discussed options for treatment with the patient given constant and worsening pain despite conservative management.  She elected to proceed with shoulder surgery,  which would consist of evaluation for infection, implant removal, potential revision shoulder replacement to reverse shoulder replacement.  Risks including but not limited to infection and instability were discussed with the patient.  Informed consent  obtained.  DESCRIPTION OF PROCEDURE:  After an adequate level of anesthesia was achieved, the patient was positioned in modified beach chair position.  Right shoulder correctly identified and  sterilely prepped and draped in the usual manner.  Timeout called,  verifying correct patient, correct site.  We entered the patient's shoulder using the patient's prior deltopectoral incision with a 10 blade scalpel and carried that dissection down through the subcutaneous tissues using Bovie.  We identified the  coracoid process and used that as our guide for the deltopectoral interval.  We opened that up with the Bovie.  We were then able to retract the conjoined tendon away from where the subscap used to be.  There was essentially a pseudocapsule anteriorly,  but no remnant of the subscap remaining.  We went ahead and removed the pseudocapsule.  Clear fluid was expressed.  We cultured that and sent it for stat Gram stain, aerobic and anaerobic culture.  The patient also had failed the rotator cuff, so the  supraspinatus and infraspinatus were torn.  We extended and rotated the shoulder, delivering the humeral head out of the wound.  We did find the glenoid, which was still in place but loose and retrieved that using a Cobb elevator and rongeur.  It was  retrieved with all of its pegs intact.  We did some partial synovectomy.  We then removed the head with a wedge and noted the stem to be still secure in place.  We used a thin flexible osteotome proximally to get in between the bone and the implant and  then used the impactor to remove the stem which came out without difficulty.  We irrigated well.  The stat Gram stain came back as no organisms and very few white cells.  We made a decision at that time to move forward with reverse shoulder replacement.   There were some cysts present, likely where the patient's prior pegs had been on the anatomic glenoid.  We used a curette to curette out those  holes and then bone grafted them with allograft bone graft, cancellous chips.  We then found our center point  on our glenoid face, which was irregular in shape but I thought would support the baseplate well and we  found the center point, placed our guide pin.  We then reamed for the metaglene baseplate, getting into some stable bone superiorly and inferiorly.   We then drilled out our central peg hole and then impacted the metaglene into position.  We felt like the metaglene baseplate was secure.  We then placed a 30 screw inferiorly, a 24 superiorly and an 18 anteriorly.  We had good baseplate security.  We  then selected a 38 standard glenosphere  and attached that to the baseplate.  Once we had that screwed and impacted and then screwed into position, I did a finger sweep to make sure that it was seated perfectly and there was no soft tissue that had been  caught up in that bearing.  We then went ahead and went back to the humeral side.  We reamed, first of all we could only get up to the 6 on the stem.  We felt like we could not get to the 8 because of her osteopetrosis, so we used the 6 as a guide for  our reamer.  We reamed for the 1 centered and then we impacted the trial 6 stem with a 1 centered metaphysis to where it was supported by bone and would not advance any further.  We then selected initially a 38+3 poly.  We wound up having to go up to a 9  mm augment which is the metal augment and then a 38+3 poly on top of that.  So we went up another 9 mm.  That gave [place] good stability and no gapping with inferior pull or with external rotation, conjoined appropriately tensioned, so we removed the trial  components, irrigated thoroughly and then used impaction grafting technique with the [place] 6 stem with the 1 centered HA coated metaphysis set on the 0 setting and impacted into position and was well supported, well seated.  We selected the real +9  augment which we impacted in the 38+3 poly and impacted on the tray and reduced the shoulder again.  Happy with our soft tissue balancing.  We irrigated thoroughly and then repaired the deltopectoral interval with 0 Vicryl suture followed by 2-0 Vicryl  for  subcutaneous closure and 4-0 Monocryl for skin.  Staples for the skin.  Sterile bandage applied.  The patient was placed in a shoulder sling and transported to the recovery room in stable condition.  HN/NUANCE  D:07/13/2020 T:07/13/2020 JOB:[id number]

## 2020-07-13 NOTE — Interval H&P Note (Signed)
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 The various methods of treatment have been discussed with the patient and family. After consideration of risks, benefits and other options for treatment, the patient has consented to  Procedure(s) with comments: Right shoulder reverse total revision (Right) - interscalene block as a surgical intervention.  The patient's history has been reviewed, patient examined, no change in status, stable for surgery.  I have reviewed the patient's chart and labs. I discussed  with the patient that there are many considerations which will dictate what we do in surgery including the presence or absence of infection in the shoulder, the quality of remaining bone on the glenoid side and our ability to remove the patient's current stem.  Her condition of osteopetrosis complicates the situation. The patient is aware of all of these concerns. We will be for sure removing the loose glenoid component.  Questions were answered to the patient's satisfaction.     Augustin Schooling

## 2020-07-14 ENCOUNTER — Other Ambulatory Visit (HOSPITAL_COMMUNITY): Payer: Self-pay | Admitting: Cardiology

## 2020-07-14 ENCOUNTER — Encounter (HOSPITAL_COMMUNITY): Payer: Self-pay | Admitting: Orthopedic Surgery

## 2020-07-14 DIAGNOSIS — T84098A Other mechanical complication of other internal joint prosthesis, initial encounter: Secondary | ICD-10-CM | POA: Diagnosis not present

## 2020-07-14 LAB — BASIC METABOLIC PANEL
Anion gap: 9 (ref 5–15)
BUN: 21 mg/dL (ref 8–23)
CO2: 22 mmol/L (ref 22–32)
Calcium: 9.3 mg/dL (ref 8.9–10.3)
Chloride: 108 mmol/L (ref 98–111)
Creatinine, Ser: 1.17 mg/dL — ABNORMAL HIGH (ref 0.44–1.00)
GFR, Estimated: 50 mL/min — ABNORMAL LOW (ref >60)
Glucose, Bld: 140 mg/dL — ABNORMAL HIGH (ref 70–99)
Potassium: 4 mmol/L (ref 3.5–5.1)
Sodium: 139 mmol/L (ref 135–145)

## 2020-07-14 LAB — HEMOGLOBIN AND HEMATOCRIT, BLOOD
HCT: 34.3 % — ABNORMAL LOW (ref 36.0–46.0)
Hemoglobin: 11.2 g/dL — ABNORMAL LOW (ref 12.0–15.0)

## 2020-07-14 NOTE — Discharge Instructions (Signed)
Ice to the shoulder constantly.  Keep the incision covered and clean and dry for one week, then ok to get it wet in the shower.  Do exercise as instructed several times per day. Take antibiotic until completed course  DO NOT reach behind your back or push up out of a chair with the operative arm.  Use a sling while you are up and around for comfort, may remove while seated.  Keep pillow propped behind the operative elbow.  Follow up with Dr [name] in two weeks in the office, call [phone] for appt.    Shoulder Arthroscopy, Care After This sheet gives you information about how to care for yourself after your procedure. Your health care provider may also give you more specific instructions. If you have problems or questions, contact your health care provider. What can I expect after the procedure? After the procedure, it is common to have:  Pain that can be relieved by taking pain medicine.  Swelling.  A small amount of fluid from the incision.  Stiffness that improves over time. Follow these instructions at home: If you have a sling or immobilizer:  Wear the sling or immobilizer as told by your health care provider. Remove it only as told by your health care provider. These devices protect your shoulder and help it heal by keeping it in place.  Loosen the sling or immobilizer if your fingers tingle, become numb, or turn cold and blue.  Keep the sling or immobilizer clean.  Ask if you may remove the sling or immobilizer for bathing. If you need to keep it on while bathing and it is not waterproof: ? Do not let it get wet. ? Cover it with a watertight covering when you take a bath or a shower. Incision care   Follow instructions from your health care provider about how to take care of your incisions. Make sure you: ? Wash your hands with soap and water before you change your bandage (dressing). If soap and water are not available, use hand sanitizer. ? Change your dressing as  told by your health care provider. ? Leave stitches (sutures), staples, skin glue, or adhesive strips in place. These skin closures may need to stay in place for 2 weeks or longer. If adhesive strip edges start to loosen and curl up, you may trim the loose edges. Do not remove adhesive strips completely unless your health care provider tells you to do that.  Check your incision areas every day for signs of infection. Check for: ? Redness ? More swelling or pain. ? Blood or more fluid. ? Warmth. ? Pus or a bad smell. Bathing  Do not take baths, swim, or use a hot tub until your health care provider approves. Ask your health care provider if you may take showers. You may only be allowed to take sponge baths. Activity  Ask your health care provider what activities are safe for you during recovery, and ask what activities you need to avoid.  Do not lift with your affected shoulder until your health care provider approves.  Avoid pulling and pushing with the arm on your affected side.  If physical therapy was prescribed, do exercises as directed. Doing exercises may help to improve shoulder movement and flexibility (range of motion). Driving  Do not drive until your health care provider approves.  Do not drive or use heavy machinery while taking prescription pain medicine. Managing pain, stiffness, and swelling   If lying down flat causes shoulder  discomfort, it may help to sleep in a sitting position for a few days after your procedure. Try sleeping in a reclining chair or propping yourself up with extra pillows in bed.  If directed, put ice on the affected area: ? Put ice in a plastic bag or use the icing device (cold therapy unit) that you were given. Follow instructions from your health care provider about how to use the icing device. ? Place a towel between your skin and the bag or between your skin and the icing device. ? Leave the ice on for 20 minutes, 2-3 times a day.  Move  your fingers often to avoid stiffness and to lessen swelling. General instructions  Take over-the-counter and prescription medicines only as told by your health care provider.  If you are taking prescription pain medicine, take actions to prevent or treat constipation. Your health care provider may recommend that you: ? Drink enough fluid to keep your urine pale yellow. ? Eat foods that are high in fiber, such as fresh fruits and vegetables, whole grains, and beans. ? Limit foods that are high in fat and processed sugars, such as fried or sweet foods. ? Take an over-the-counter or prescription medicine for constipation.  Do not use any products that contain nicotine or tobacco, such as cigarettes and e-cigarettes. These can delay incision or bone healing. If you need help quitting, ask your health care provider.  Keep all follow-up visits as told by your health care provider. This is important. Contact a health care provider if you:  Have a fever.  Have severe pain.  Have redness around an incision.  Have more swelling or pain in an incision area.  Have blood or more fluid coming from an incision.  Notice that an incision feels warm to the touch.  Notice pus or a bad smell coming from an incision.  Notice that an incision has opened up.  Develop a rash. Get help right away if you:  Have difficulty breathing.  Have chest pain.  Notice that your fingers tingle, are numb, or are cold and blue even after you loosen your sling or immobilizer.  Develop pain in your lower leg or at the back of your knee. Summary  If you have a sling or immobilizer, wear it as told by your health care provider. These devices protect your shoulder and help it heal by keeping it in place.  If lying down flat causes shoulder discomfort, it may help to sleep in a sitting position for a few days after your procedure. Try sleeping in a reclining chair, or try propping yourself up with extra pillows in  bed.  If physical therapy was prescribed, do exercises as directed. Doing exercises may help to improve shoulder movement and flexibility (range of motion).  Keep all follow-up visits as told by your health care provider. This is important. This information is not intended to replace advice given to you by your health care provider. Make sure you discuss any questions you have with your health care provider. Document Revised: 08/04/2017 Document Reviewed: 07/07/2017 Elsevier Patient Education  2020 [employer].
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
## 2020-07-14 NOTE — Anesthesia Postprocedure Evaluation (Signed)
Anesthesia Post Note  Patient: [name]  Procedure(s) Performed: Right shoulder reverse total revision (Right Shoulder)     Patient location during evaluation: PACU Anesthesia Type: General and Regional Level of consciousness: awake and alert Pain management: pain level controlled Vital Signs Assessment: post-procedure vital signs reviewed and stable Respiratory status: spontaneous breathing, nonlabored ventilation, respiratory function stable and patient connected to nasal cannula oxygen Cardiovascular status: blood pressure returned to baseline and stable Postop Assessment: no apparent nausea or vomiting Anesthetic complications: no   No complications documented.  Last Vitals:  Vitals:   07/13/20 2319 07/14/20 0447  BP: 131/77 132/61  Pulse: 71 66  Resp: 18 18  Temp: 36.7 C 36.4 C  SpO2: 96% 95%    Last Pain:  Vitals:   07/14/20 0500  TempSrc:   PainSc: 0-No pain                 Nancie Bocanegra S

## 2020-07-14 NOTE — Progress Notes (Signed)
Orthopedics Progress Note  Subjective: Comfortable this AM, some pain under the arm  Objective:  Vitals:   07/13/20 2319 07/14/20 0447  BP: 131/77 132/61  Pulse: 71 66  Resp: 18 18  Temp: 98 F (36.7 C) 97.6 F (36.4 C)  SpO2: 96% 95%    General: Awake and alert  Musculoskeletal: dressing changed, wound benign, minimal swelling Neurovascularly intact  Lab Results  Component Value Date   WBC 7.4 07/08/2020   HGB 11.2 (L) 07/14/2020   HCT 34.3 (L) 07/14/2020   MCV 102.5 (H) 07/08/2020   PLT 131 (L) 07/08/2020       Component Value Date/Time   NA 139 07/14/2020 0332   K 4.0 07/14/2020 0332   CL 108 07/14/2020 0332   CO2 22 07/14/2020 0332   GLUCOSE 140 (H) 07/14/2020 0332   BUN 21 07/14/2020 0332   CREATININE 1.17 (H) 07/14/2020 0332   CREATININE 1.01 (H) 07/05/2016 1013   CALCIUM 9.3 07/14/2020 0332   GFRNONAA 50 (L) 07/14/2020 0332   GFRNONAA 58 (L) 07/05/2016 1013   GFRAA 47 (L) 09/19/2018 0930   GFRAA 67 07/05/2016 1013    Lab Results  Component Value Date   INR 2.61 11/07/2016   INR 1.04 03/18/2011   INR 1.73 (H) 03/11/2011    Assessment/Plan: POD #1 s/p Procedure(s): Right shoulder reverse total revision OT this AM, conservative protocol D/C after therapy Follow up in two weeks in the office  Remo Lipps R. [name], MD 07/14/2020 7:30 AM

## 2020-07-14 NOTE — Care Management Obs Status (Signed)
[place] NOTIFICATION   Patient Details  Name: [name] MRN: [id number] Date of Birth: 07/13/1951   Medicare Observation Status Notification Given:  Yes    Angelita Ingles, RN 07/14/2020, 10:20 AM

## 2020-07-14 NOTE — Evaluation (Signed)
Occupational Therapy Evaluation Patient Details Name: [name] MRN: [id number] DOB: [birth year] Today's Date: 07/14/2020    History of Present Illness Pt is a 69 y/o female s/p Right shoulder reverse total revision. History includes afib, chronic diastolic CHF, pulmonary hypertension (mixed venous and PAH, on Adcirca), HLD, HTN, OSA (uses CPAP/BiPAP), morbid obesity, obesity hypoventilation syndrome, chronic hypoxic respiratory failure (O2 2L with activity as needed), CVA (2012, LLE weakness   Clinical Impression   PTA Pt mod I with DME for mobility. Today Pt is overall mod A for ADL (assist with BUE tasks, trouble reaching down to L lower side for pulling up underwear/pants) shoulder and R arm currently numb. Provided and reviewed in full shoulder handout and exercises for elbow, wrist, & hand. Pt educated on NO PROM or AROM of shoulder. Focus on compensatory strategies and AE for ADL completion. Pt verbalized and demonstrated understanding. Pt will have sister initially at home to assist. OT to defer to surgeon for further shoulder rehab.     Follow Up Recommendations  Follow surgeon's recommendation for DC plan and follow-up therapies;Supervision/Assistance - 24 hour (initially)    Equipment Recommendations  None recommended by OT    Recommendations for Other Services       Precautions / Restrictions Precautions Precautions: Shoulder Type of Shoulder Precautions: Conservative Shoulder Interventions: Shoulder sling/immobilizer;At all times;Off for dressing/bathing/exercises Precaution Booklet Issued: Yes (comment) Precaution Comments: handout reviewed in full Required Braces or Orthoses: Sling Restrictions Weight Bearing Restrictions: Yes RUE Weight Bearing: Non weight bearing Other Position/Activity Restrictions: NO PROM or AROM at shoulder      Mobility Bed Mobility               General bed mobility comments: sitting EOB when OT arrived and left in recliner     Transfers Overall transfer level: Modified independent Equipment used: Straight cane             General transfer comment: increased time required, no assist and no LOB    Balance Overall balance assessment: Mild deficits observed, not formally tested (baseline for patient)                                         ADL either performed or assessed with clinical judgement   ADL Overall ADL's : Needs assistance/impaired Eating/Feeding: Set up;Sitting   Grooming: Oral care;Wash/dry hands;Wash/dry face;Minimal assistance;Standing Grooming Details (indicate cue type and reason): due to numbness in R hand, dropping items - otherwise able to complete with supervision Upper Body Bathing: Min guard;With adaptive equipment Upper Body Bathing Details (indicate cue type and reason): educated on how to access underarm, educated on use of long handle sponge for under L armpit as well Lower Body Bathing: Min guard;Sitting/lateral leans   Upper Body Dressing : Maximal assistance;Cueing for UE precautions;Sitting Upper Body Dressing Details (indicate cue type and reason): educated in sequence Lower Body Dressing: Minimal assistance;Sitting/lateral leans Lower Body Dressing Details (indicate cue type and reason): able to perform figure 4 and don socks, able to complete one handed, in standing requires assist to pull up pants and underwear on L side (she cannot reach) educated on AE to assist Toilet Transfer: Min guard;Ambulation [facility])   Toileting- Clothing Manipulation and Hygiene: Moderate assistance;Sit to/from stand Toileting - Clothing Manipulation Details (indicate cue type and reason): able to perform peri care - assist to manage underwear on L side  Functional mobility during ADLs: Min guard;Cane       Vision         Perception     Praxis      Pertinent Vitals/Pain Pain Assessment: No/denies pain (block still in place)     Hand Dominance Left    Extremity/Trunk Assessment Upper Extremity Assessment Upper Extremity Assessment: RUE deficits/detail RUE Deficits / Details: block still in place - immobilizer used throughout session RUE: Unable to fully assess due to immobilization RUE Sensation: decreased light touch (block still in place) RUE Coordination: decreased fine motor;decreased gross motor   Lower Extremity Assessment Lower Extremity Assessment:  (at baseline)       Communication Communication Communication: No difficulties   Cognition Arousal/Alertness: Awake/alert Behavior During Therapy: WFL for tasks assessed/performed Overall Cognitive Status: Within Functional Limits for tasks assessed                                     General Comments       Exercises Exercises: Shoulder Shoulder Exercises Elbow Flexion: PROM;Right;Seated;Standing;AROM (educated as Pt's block still in place) Elbow Extension: PROM;AROM;Right;Seated;Standing (educated as Pt's block still in place) Wrist Flexion: AROM;Right;10 reps;Seated Wrist Extension: AROM;Right;10 reps;Seated Digit Composite Flexion: AROM;Right;10 reps;Seated Composite Extension: AROM;Right;10 reps;Seated   Shoulder Instructions Shoulder Instructions Donning/doffing shirt without moving shoulder: Maximal assistance;Patient able to independently direct caregiver Method for sponge bathing under operated UE: Min-guard;Patient able to independently direct caregiver Donning/doffing sling/immobilizer: Maximal assistance;Patient able to independently direct caregiver Correct positioning of sling/immobilizer: Moderate assistance Pendulum exercises (written home exercise program):  (not taught) ROM for elbow, wrist and digits of operated UE: Minimal assistance (block still in place) Sling wearing schedule (on at all times/off for ADL's): Independent Proper positioning of operated UE when showering: Min-guard Positioning of UE while sleeping: Minimal assistance     Home Living Family/patient expects to be discharged to:: Private residence Living Arrangements: Alone Available Help at Discharge: Family;Friend(s);Available 24 hours/day (sister staying with her initially 24/7) Type of Home: House Home Access: Stairs to enter [employer] of Steps: 2 (steps are wide enough for RW to sit flat) Entrance Stairs-Rails: Right Home Layout: One level     Bathroom Shower/Tub: Occupational psychologist: Standard     Home Equipment: Environmental consultant - 4 wheels;Cane - single point;Bedside commode;Shower seat - built in;Hand held shower head          Prior Functioning/Environment Level of Independence: Independent with assistive device(s)        Comments: uses SPC for mobility        OT Problem List: Decreased coordination;Decreased strength;Decreased range of motion;Decreased knowledge of precautions;Impaired sensation;Obesity;Impaired UE functional use;Pain      OT Treatment/Interventions:      OT Goals(Current goals can be found in the care plan section) Acute Rehab OT Goals Patient Stated Goal: to be able to pull my own pants up OT Goal Formulation: With patient Time For Goal Achievement: 07/28/20 Potential to Achieve Goals: Good  OT Frequency:     Barriers to D/C:            Co-evaluation              AM-PAC OT "6 Clicks" Daily Activity     Outcome Measure Help from another person eating meals?: A Little Help from another person taking care of personal grooming?: A Little Help from another person toileting, which includes using toliet, bedpan,  or urinal?: A Little Help from another person bathing (including washing, rinsing, drying)?: A Little Help from another person to put on and taking off regular upper body clothing?: A Lot Help from another person to put on and taking off regular lower body clothing?: A Little 6 Click Score: 17   End of Session Equipment Utilized During Treatment: Other (comment)  (sling) Nurse Communication: Mobility status (dressed, calling family )  Activity Tolerance: Patient tolerated treatment well Patient left: in chair;with call bell/phone within reach  OT Visit Diagnosis: Pain;Muscle weakness (generalized) (M62.81) Pain - Right/Left: Right Pain - part of body: Shoulder                Time: 3546-5681 OT Time Calculation (min): 38 min Charges:  OT General Charges $OT Visit: 1 Visit OT Evaluation $OT Eval Moderate Complexity: 1 Mod OT Treatments $Self Care/Home Management : 8-22 mins  Jesse Sans OTR/L Acute Rehabilitation Services Pager: [phone] Office: [phone]  Merri Ray Carla Rashad 07/14/2020, 10:49 AM

## 2020-07-14 NOTE — Progress Notes (Signed)
PT Cancellation Note  Patient Details Name: [name] MRN: [id number] DOB: 08-10-1951   Cancelled Treatment:    Reason Eval/Treat Not Completed: (P) PT screened, no needs identified, will sign off OT reports pt at baseline level of function with no PT services needed.  Daryan Cagley B. Migdalia Dk PT, DPT Acute Rehabilitation Services Pager [phone] Office [phone]  [place] 07/14/2020, 10:00 AM

## 2020-07-14 NOTE — Care Management CC44 (Signed)
Condition Code 44 Documentation Completed  Patient Details  Name: LAWREN SEXSON MRN: [id number] Date of Birth: 08-26-1951   Condition Code 44 given:  Yes Patient signature on Condition Code 44 notice:  Yes Documentation of 2 MD's agreement:  Yes Code 44 added to claim:  Yes    Angelita Ingles, RN 07/14/2020, 10:21 AM

## 2020-07-14 NOTE — Plan of Care (Signed)
Pt doing well. Pt given D/C instructions with verbal understanding. Rx's were called to the pharmacy by MD. Pt's incision is clean and dry with no sign of infection. Pt's IV was removed prior to D/C. Pt D/C'd home via wheelchair per MD order. Pt is stable @ D/C and has no other needs at this time. Holli Humbles, RN

## 2020-07-17 ENCOUNTER — Other Ambulatory Visit: Payer: Self-pay

## 2020-07-17 ENCOUNTER — Telehealth (HOSPITAL_COMMUNITY): Payer: Self-pay | Admitting: Vascular Surgery

## 2020-07-17 ENCOUNTER — Ambulatory Visit (HOSPITAL_COMMUNITY)
Admission: RE | Admit: 2020-07-17 | Discharge: 2020-07-17 | Disposition: A | Discharge status: Home / Self Care | Payer: PPO | Source: Ambulatory Visit | Attending: Cardiology | Admitting: Cardiology

## 2020-07-17 DIAGNOSIS — I482 Chronic atrial fibrillation, unspecified: Secondary | ICD-10-CM

## 2020-07-17 DIAGNOSIS — I272 Pulmonary hypertension, unspecified: Secondary | ICD-10-CM

## 2020-07-17 NOTE — Telephone Encounter (Signed)
Pt will call back to make 3 month appt w/ [facility]

## 2020-07-18 LAB — AEROBIC/ANAEROBIC CULTURE W GRAM STAIN (SURGICAL/DEEP WOUND): Culture: NO GROWTH

## 2020-07-18 NOTE — Progress Notes (Signed)
Heart Failure TeleHealth Note  Due to national recommendations of social distancing due to [place] 19, Audio/video telehealth visit is felt to be most appropriate for this patient at this time.  See MyChart message from today for patient consent regarding telehealth for [facility].  Date:  07/18/2020   ID:  [name], [name] 1951/06/29, MRN [id number]  Location: Home  Provider location: [place] [facility] Type of Visit: Established patient   PCP:  [name], MD  Cardiologist:  [name]  Chief Complaint:  Shortness of breath   History of Present Illness: [name] is a 69 y.o. female who presents via audio/video conferencing for a telehealth visit today.     she denies symptoms worrisome for COVID 70.   69 y.o. with PMH of pulmonary HTN, morbid obesity, chronic afib (on Xarelto, CVA in 2012, OHS/OSA (Had U PE3 surgery, and chronic respiratory failure with hypoxemia on continuous 02 at 2 lpm via [place]).  Admitted 3/2 ->6/33/35 with A/C diastolic CHF and A/C respiratory failure. Pt initially refused Bipap so venti mask used. Eventually tolerated transition to BiPAP. Overall pt diuresed from 285 lbs down to 229 lbs with 38 L of diuresis.  Echo 11/07/16 EF 55-60%, PASP 11m Hg, mildly dilated RV with moderate to severely decreased RV systolic function. RHC/LHC in 3/18 showed no coronary disease and confirmed severe PH.    Echo in 7/20 showed EF 60-65%, mild LVH, normal RV, PASP 38 mmHg, mild AS. Echo in 8/21 showed EF 60-65%, mildly decreased RV function, severe biatrial enlargement, small PFO, unable to estimate PA systolic pressure.   She has been using Bipap nightly and 2 L oxygen with exertion.  She recently had a right shoulder revision operation (11/21).  She seems to be doing ok post-op.  Her breathing is at baseline, able to get around the house without dyspnea.  No lightheadedness or syncope.  No chest pain.  Weight has been stable at  home.   Labs (1/19): K 3.7, creatinine 1.37 Labs (11/19): K 3.9, creatinine 1.2, LDL 67, hgb 13.2 Labs (1/20): K 3.5, creatinine 1.34 Labs (5/20): K 3.9, creatinine 1.18, LDL 73 Labs (6/21): K 4.7, creatinine 1.19, LDL 71, hgb 13.7 Labs (11/21): K 4, creatinine 1.17, LDL 71  PMH 1. 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 - RHC/LHC 11/07/16: No angiographic CAD; PA 90/55, LVEDP 23 (PCWP inaccurate), CI 2.15 Fick/2.32 Thermo, shunt run negative, PVR 5.7 WU.  - Echo (7/20): EF 60-65%, mild LVH, normal RV, PASP 38 mmHg, mild AS.  - Echo (8/21): EF 60-65%, mildly decreased RV function, severe biatrial enlargement, small PFO, unable to estimate PA systolic pressure. 2. Chronic hypercarbic/hypoxic respiratory failure with OHS/OSA. Sees Dr. McLake BellsUsing nightly BiPAP and oxygen by nasal cannula during the day.  3. Atrial fibrillation: Chronic - Rate controlled on Xarelto + atenolol  4. 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 PVR 5.7 WU on RHC 3/18. Suspect group 2 (elevated LA pressure) and group 3 (OHS/OSA) PH. However, cannot rule out group 1 component. She had V/Q scan that was not suggestive of chronic PE and high resolution chest CT that was not suggestive of ILD. ANA, RF, anti-SCL70 all negative.   5. H/o CVA 6. Hypothyroidism 7. H/o TKR 8. Aortic stenosis: Mild on 7/20 echo.   Current  Outpatient Medications  Medication Sig Dispense Refill   acetaZOLAMIDE (DIAMOX) 250 MG tablet TAKE 1 TABLET BY MOUTH EVERY DAY (Patient taking differently: Take 250 mg by mouth daily. ) 90 tablet 2   ADCIRCA 20 MG tablet TAKE 2 TABLETS BY MOUTH DAILY. CALL [phone] TO REFILL (Patient taking differently: Take 40 mg by mouth daily. ) 180 tablet 1   allopurinol (ZYLOPRIM) 100 MG tablet Take 1  tablet (100 mg total) by mouth 2 (two) times daily. 180 tablet 1   atenolol (TENORMIN) 50 MG tablet TAKE 1 TABLET (50 MG TOTAL) BY MOUTH DAILY. MUST BE SEEN FOR FURTHER REFILLS 90 tablet 0   betamethasone dipropionate 0.05 % cream APPLY SPARINGLY TO AFFECTED AREA TWICE A DAY (Patient not taking: Reported on 06/25/2020)     Cholecalciferol (VITAMIN D) 125 MCG (5000 UT) CAPS Take 5,000 Units by mouth 3 (three) times a week.      Cyanocobalamin 5000 MCG TBDP Take 5,000 mcg by mouth 3 (three) times a week.      diclofenac sodium (VOLTAREN) 1 % GEL Apply 2 g topically 2 (two) times daily as needed (for arthritis).      doxycycline (VIBRAMYCIN) 50 MG capsule Take 2 capsules (100 mg total) by mouth 2 (two) times daily for 5 days. 20 capsule 0   levothyroxine (SYNTHROID) 75 MCG tablet Take 1 tablet (75 mcg total) by mouth daily. 90 tablet 1   methocarbamol (ROBAXIN) 500 MG tablet TAKE 1/2 TABLET BY MOUTH AS NEEDED FOR MUSCLE SPASMS. 40 tablet 1   OMEGA-3 KRILL OIL PO Take 1 capsule by mouth 3 (three) times a week. (Patient not taking: Reported on 06/25/2020)     oxyCODONE-acetaminophen (PERCOCET) 5-325 MG tablet Take 1-2 tablets by mouth every 4 (four) hours as needed for moderate pain or severe pain. 30 tablet 0   potassium chloride (KLOR-CON) 10 MEQ tablet Take 2 tablets (20 mEq total) by mouth 2 (two) times daily. Must be seen for further refills (Patient taking differently: Take 20 mEq by mouth 2 (two) times daily. ) 120 tablet 5   rosuvastatin (CRESTOR) 5 MG tablet TAKE 1 TABLET (5 MG TOTAL) BY MOUTH DAILY. MUST BE SEEN IN OFFICE FOR FURTHER REFILLS 90 tablet 0   torsemide (DEMADEX) 20 MG tablet TAKE 2 TABLETS (40 MG TOTAL) BY MOUTH 2 (TWO) TIMES DAILY. (Patient taking differently: Take 20-40 mg by mouth See admin instructions. Take 40 mg by mouth in the morning and 20 mg in the afternoon as needed for fluid) 360 tablet 2   traMADol (ULTRAM) 50 MG tablet Take 1 tablet (50 mg total) by mouth  3 (three) times daily as needed. 60 tablet 1   XARELTO 20 MG TABS tablet TAKE 1 TABLET (20 MG TOTAL) BY MOUTH DAILY WITH SUPPER. MUST BE SEEN FOR FURTHER REFILLS 90 tablet 0   No current facility-administered medications for this encounter.    Allergies:   Ambien [zolpidem tartrate], Losartan, Prevacid [lansoprazole], Adhesive [tape], Keflex [cephalexin], Motrin [ibuprofen], Prilosec [omeprazole], Ace inhibitors, Benadryl [diphenhydramine hcl], and Spironolactone   Social History:  The patient  reports that she has never smoked. She has never used smokeless tobacco. She reports current alcohol use of about 1.0 standard drink of alcohol per week. She reports that she does not use drugs.   Family History:  The patient's family history includes COPD in her mother; Diabetes in her father; Heart attack in her father; Hypertension in her brother, father, mother, and sister; Liver cancer in her father;  Osteoporosis in her mother; Stroke in her maternal grandmother.   ROS:  Please see the history of present illness.   All other systems are personally reviewed and negative.   Exam:  (Video/Tele Health Call; Exam is subjective and or/visual.) BP 123/67 (home cuff). General: Speaks in full sentences, no respiratory difficulty.  Lungs: Normal respiratory effort with conversation.  Abdomen: non-distended per patient report.  Extremities: Denies edema.  Neuro: Alert/oriented.   Recent Labs: 02/28/2020: ALT 7; TSH 1.22 07/08/2020: Platelets 131 07/14/2020: BUN 21; Creatinine, Ser 1.17; Hemoglobin 11.2; Potassium 4.0; Sodium 139  Personally reviewed   Wt Readings from Last 3 Encounters:  07/13/20 113.4 kg (250 lb)  07/08/20 114 kg (251 lb 4.8 oz)  04/08/20 113.4 kg (250 lb)    ASSESSMENT AND PLAN:  1. Chronic diastolic CHF:  Associated with severe pulmonary hypertension and prominent RV failure.  NYHA class II symptoms.  Most recent echo in 8/21 showed showed LV EF 60-65%, mildly decreased RV  function. NYHA class II with stable weight. Recent BMET stable.  - Continue torsemide 24m in the am and 20 mg in the pm.  - Continue diamox 250 mg daily.  2. Chronic hypercarbic/hypoxic respiratory failure with OHS/OSA: Used to see Dr. MLake Bells  Using bipap at night and oxygen during the day with exertion.    - Compliant with Bipap nightly.  - Continue regular exercise and efforts at weight loss. - Needs pulmonary followup.  3. Atrial fibrillation: Chronic, rate controlled  - Continue atenolol 50 mg daily.    - Continue Xarelto for anticoagulation.   4. 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 PVR 5.7 WU [place]. Suspect group 2 (elevated LA pressure) and group 3 (OHS/OSA) PH. However, cannot rule out group 1 component. She had V/Q scan that was not suggestive of chronic PE and high resolution chest CT that was not suggestive of ILD. ANA, RF, anti-SCL70 all negative.  She did not have a marked improvement from taking Adcirca => suspect predominantly group 2 and 3 PH.  Will hold off on additional pulmonary vasodilators. Echo in 8/21 with mildly decreased RV function, unable to estimate PA systolic pressure.  - Continue Adcirca 40 mg daily.  - Continue BiPAP at night for OSA and oxygen with exertion.  - 6 minute walk next appt in office. 5. HTN: BP controlled.    COVID screen The patient does not have any symptoms that suggest any further testing/ screening at this time.  Social distancing reinforced today.  Patient Risk: After full review of this patients clinical status, I feel that they are at moderate risk for cardiac decompensation at this time.  Relevant cardiac medications were reviewed at length with the patient today. The patient does not have concerns regarding their medications at this time.   Recommended follow-up:  3 months in office.   Today, I have spent 16 minutes with the patient with telehealth technology discussing the  above issues .    Signed, DLoralie Champagne MD  07/18/2020  [place]Heart and [place] [phone](office) [phone](fax)

## 2020-07-28 DIAGNOSIS — M25311 Other instability, right shoulder: Secondary | ICD-10-CM | POA: Diagnosis not present

## 2020-07-28 DIAGNOSIS — Z4789 Encounter for other orthopedic aftercare: Secondary | ICD-10-CM | POA: Diagnosis not present

## 2020-08-04 DIAGNOSIS — G4733 Obstructive sleep apnea (adult) (pediatric): Secondary | ICD-10-CM | POA: Diagnosis not present

## 2020-08-04 DIAGNOSIS — J9610 Chronic respiratory failure, unspecified whether with hypoxia or hypercapnia: Secondary | ICD-10-CM | POA: Diagnosis not present

## 2020-08-07 ENCOUNTER — Other Ambulatory Visit (HOSPITAL_COMMUNITY): Payer: Self-pay | Admitting: Cardiology

## 2020-08-07 ENCOUNTER — Other Ambulatory Visit: Payer: Self-pay | Admitting: Internal Medicine

## 2020-08-11 ENCOUNTER — Telehealth: Payer: Self-pay | Admitting: Internal Medicine

## 2020-08-11 DIAGNOSIS — K649 Unspecified hemorrhoids: Secondary | ICD-10-CM

## 2020-08-11 NOTE — Telephone Encounter (Signed)
Patient is calling and wanted to see if provider could send her to Gastroenterologist and is also requesting a call back, please advise. CB is [phone]

## 2020-08-13 ENCOUNTER — Telehealth (HOSPITAL_COMMUNITY): Payer: Self-pay | Admitting: Pharmacist

## 2020-08-13 NOTE — Telephone Encounter (Signed)
Sent in [place] re-enrollment application to [place], PharmD, BCPS, BCCP, CPP Heart Failure Clinic Pharmacist [phone]

## 2020-08-17 ENCOUNTER — Telehealth (HOSPITAL_COMMUNITY): Payer: Self-pay | Admitting: Pharmacy Technician

## 2020-08-17 NOTE — Telephone Encounter (Signed)
Received a denial letter from [employer] regarding the patient's Adcirca assistance. Called [phone] and left a message for someone to call back and further explain the denial.  Will follow up.

## 2020-08-18 NOTE — Telephone Encounter (Signed)
That is fine 

## 2020-08-18 NOTE — Telephone Encounter (Signed)
Pt is requesting a referral to GI for hemorrhoids, okay to place referral in Dr. Velora Mediate absence? Thanks!

## 2020-08-19 NOTE — Telephone Encounter (Signed)
Called [employer] and was able to get some clarification. They have already spoke with the patient but could not give me the income amounts that their electronic checker pulled.  Called and spoke with the patient and she was able to get a [place] for her Adcirca. Advised her to call me in the future if she runs into any issues.  Charlann Boxer, CPhT

## 2020-08-19 NOTE — Telephone Encounter (Signed)
I spoke with pt, she is aware that referral has been placed.

## 2020-08-20 DIAGNOSIS — Z4789 Encounter for other orthopedic aftercare: Secondary | ICD-10-CM | POA: Diagnosis not present

## 2020-09-11 ENCOUNTER — Other Ambulatory Visit: Payer: Self-pay | Admitting: Cardiology

## 2020-10-05 ENCOUNTER — Other Ambulatory Visit (HOSPITAL_COMMUNITY): Payer: Self-pay | Admitting: Cardiology

## 2020-10-06 ENCOUNTER — Other Ambulatory Visit (HOSPITAL_COMMUNITY): Payer: Self-pay | Admitting: Cardiology

## 2020-10-09 ENCOUNTER — Telehealth (HOSPITAL_COMMUNITY): Payer: Self-pay | Admitting: Pharmacy Technician

## 2020-10-09 NOTE — Telephone Encounter (Signed)
Received an approval letter from [employer] that the patient was approved to receive Adcirca from them through the remainder of the year. I spoke with [employer] and although the patient was approved, she called into [employer] herself and told them she had a [place] and she wanted to be removed from the program.  I inquired what would happen if the patient's grant runs out during this year, would she be able to come back to [facility] with her current approval? The representative could not tell me a definite answer. She stated that they would have to ask for manager approval. They may just reinstate her current approval, her case may need to be reevaluated.   Charlann Boxer, CPhT

## 2020-10-12 ENCOUNTER — Telehealth: Payer: Self-pay | Admitting: Adult Health

## 2020-10-12 NOTE — Telephone Encounter (Signed)
Spoke with patient. She is currently scheduled for an OV with TP on 10/15/20. She is requesting to have this switched to a televisit. She stated that she has been having a lot of problems with her back and she is not stable on her feet yet. She also does not feel safe coming to doctors office due to the increased covid cases.   I advised her that I would send a message over to TP to see if she is ok with her switching a televisit. She verbalized understanding.   TP, can you please advise? Thanks!

## 2020-10-12 NOTE — Telephone Encounter (Signed)
Pyt. Is returning call concerning appt. Phone number is [phone]

## 2020-10-12 NOTE — Telephone Encounter (Signed)
Spoke with patient. Her OV has been changed to a televisit. Nothing further needed at time of call.

## 2020-10-12 NOTE — Telephone Encounter (Signed)
ATC patient about upcoming appointment LMTCB

## 2020-10-12 NOTE — Telephone Encounter (Signed)
That is fine

## 2020-10-14 ENCOUNTER — Encounter: Payer: Self-pay | Admitting: Gastroenterology

## 2020-10-14 ENCOUNTER — Ambulatory Visit: Payer: PPO | Admitting: Gastroenterology

## 2020-10-14 ENCOUNTER — Other Ambulatory Visit (INDEPENDENT_AMBULATORY_CARE_PROVIDER_SITE_OTHER): Payer: PPO

## 2020-10-14 VITALS — BP 120/64 | HR 80 | Ht 60.05 in | Wt 254.0 lb

## 2020-10-14 DIAGNOSIS — D500 Iron deficiency anemia secondary to blood loss (chronic): Secondary | ICD-10-CM

## 2020-10-14 DIAGNOSIS — K648 Other hemorrhoids: Secondary | ICD-10-CM

## 2020-10-14 LAB — CBC
HCT: 36.3 % (ref 36.0–46.0)
Hemoglobin: 11.6 g/dL — ABNORMAL LOW (ref 12.0–15.0)
MCHC: 32.1 g/dL (ref 30.0–36.0)
MCV: 93.7 fl (ref 78.0–100.0)
Platelets: 157 10*3/uL (ref 150.0–400.0)
RBC: 3.87 Mil/uL (ref 3.87–5.11)
RDW: 16.1 % — ABNORMAL HIGH (ref 11.5–15.5)
WBC: 7.8 10*3/uL (ref 4.0–10.5)

## 2020-10-14 LAB — FERRITIN: Ferritin: 20.6 ng/mL (ref 10.0–291.0)

## 2020-10-14 LAB — IRON: Iron: 35 ug/dL — ABNORMAL LOW (ref 42–145)

## 2020-10-14 NOTE — Patient Instructions (Addendum)
RECOMMENDATION(S):    We have scheduled you for your initial hemorrhoid banding with Dr. Fuller Plan on 11/10/20 @ 3:40pm. You may continue your Xarelto as prescribed.  LABS:   . Your provider has requested that you go to the basement level for lab work before leaving today. Press "B" on the elevator. The lab is located at the first door on the left as you exit the elevator.  HEALTHCARE LAWS AND MY CHART RESULTS:   . Due to recent changes in healthcare laws, you may see the results of your imaging and laboratory studies on MyChart before your provider has had a chance to review them.  We understand that in some cases there may be results that are confusing or concerning to you. Not all laboratory results come back in the same time frame and the provider may be waiting for multiple results in order to interpret others.  Please give [place] 48 hours in order for your provider to thoroughly review all the results before contacting the office for clarification of your results.   BMI:  If you are age 72 or older, your body mass index should be between 23-30. Your Body mass index is 49.52 kg/m. If this is out of the aforementioned range listed, please consider follow up with your Primary Care Provider.  Thank you for trusting me with your gastrointestinal care!    [place], MD, MPH

## 2020-10-14 NOTE — Progress Notes (Signed)
Picture taken prior to rectal exam
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
## 2020-10-15 ENCOUNTER — Ambulatory Visit (INDEPENDENT_AMBULATORY_CARE_PROVIDER_SITE_OTHER): Payer: PPO | Admitting: Adult Health

## 2020-10-15 ENCOUNTER — Other Ambulatory Visit: Payer: Self-pay

## 2020-10-15 DIAGNOSIS — E662 Morbid (severe) obesity with alveolar hypoventilation: Secondary | ICD-10-CM | POA: Diagnosis not present

## 2020-10-15 DIAGNOSIS — R0902 Hypoxemia: Secondary | ICD-10-CM

## 2020-10-15 DIAGNOSIS — I272 Pulmonary hypertension, unspecified: Secondary | ICD-10-CM | POA: Diagnosis not present

## 2020-10-15 DIAGNOSIS — I5032 Chronic diastolic (congestive) heart failure: Secondary | ICD-10-CM | POA: Diagnosis not present

## 2020-10-15 DIAGNOSIS — G4733 Obstructive sleep apnea (adult) (pediatric): Secondary | ICD-10-CM

## 2020-10-15 NOTE — Patient Instructions (Addendum)
Continue on CPAP  At bedtime  .  Work on healthy weight  Do not drive if sleepy.  Continue with follow up with Cardiology as planned.  Continue on Torsemide .  Low salt diet.  Continue on Adcirca  Follow up with Dr. Hermina Staggers or Martesha Niedermeier NP  in 6  months and As needed   Please contact office for sooner follow up if symptoms do not improve or worsen or seek emergency care

## 2020-10-15 NOTE — Progress Notes (Signed)
.Virtual Visit via Telephone Note  I connected with [name] on 10/15/20 at  9:30 AM EST by telephone and verified that I am speaking with the correct person using two identifiers.  Location: Patient: Home  Provider: Office    I discussed the limitations, risks, security and privacy concerns of performing an evaluation and management service by telephone and the availability of in person appointments. I also discussed with the patient that there may be a patient responsible charge related to this service. The patient expressed understanding and agreed to proceed.   History of Present Illness: 70 year old female former smoker followed for chronic hypoxic respiratory failure, obesity hypoventilation syndrome.  She has pulmonary hypertension secondary to OHS/OSA and significant diastolic heart failure.  She is maintained on CPAP at bedtime.  She also is on oxygen 2 L with activity and as needed. Medical history is significant for CVA in 2012, chronic A. fib on Xarelto, patient has pulmonary hypertension and is on Adcirca.   Today's telemedicine visit is for a 53-monthfollow-up for chronic respiratory failure, obesity hyperventilation syndrome and obstructive sleep apnea. Patient remains on oxygen 2 L with activity, says she has not had to use in long time. Oxygen levels remain good on room air. Has POC . Now that shoulder is better , she is going to start exercising and will use it with heavy activity   Patient has underlying OSA and OHS.  Previously been on BiPAP but has been on CPAP over the last 3 to 4 years.  Patient says she uses her CPAP every night never misses any nights.  And feels rested with no significant daytime sleepiness.  CPAP download shows excellent compliance with 100% daily usage.  She is on CPAP each night around 8 hours.  Patient is on auto CPAP 5 to 20 cm H2O.  AHI is 3.0/hour. Using nasal pillows.    Patient is followed by cardiology for chronic diastolic heart  failure with severe pulmonary hypertension.  She remains on Adcirca.  She is also on Demadex and Diamox.  She is on Xarelto for A. Fib. Weight has been stable , no leg swelling in am , mild only in evening . Has been doing well.   Patient did have shoulder surgery last fall.  States that she did well. Feels it has really helped her so much . Says best surgery she has ever had. Feels so much better.  Did well with anesthesia , no known post op complications. No breathing issues .  She is returning to baseline activities .  Surgery done by Dr NVeverly Fells.    Observations/Objective:  Speaks in full sentences.  Says O2 saturations at home are 96% today . 98% yesterday on room air.   107/62 b/p this am.   severe obstructive sleep apnea in 2012 with AHI 114  ABG  10/06/2016 performed on oxygen: 7.43/53.1/65.0/34.7 11/13/2016 7.43/82/88/96% on 4L   RHC 11/07/16 TPG 50-529mHg PAP mean: 55 mmHg PCWP: 63 mmHg (not accurate). LVEDP: 28. PVR using LVEDP and mean PA 5.7 WU.  Cardiac output/index by Fick: 4.74/2.15. RV pressures/EDP: 97/13/21 mmHg Shunt showed no left to right shunt.   Cardiac imaging: March 2018 echocardiogram LVEF 55-60%, ventricular septum consistent with RV overload, RV dilated systolic function moderate to severely reduced, PA pressure estimate 90 mmHg  Imaging: February first 2018 VQ scan no evidence of blood clot  Imaging: 10/06/2016 high-resolution CT scan of the chest: , there is normal pulmonary parenchyma with the exception  of interlobular septal thickening and groundglass worse in the dependent sections of all lobes, there is a moderate size right-sided pleural effusion, pulmonary vascular engorgement.   2018 -changed from BIPAP to CPAP .  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 PVR 5.7 WU on RHC 3/18. Suspect group 2 (elevated LA pressure) and group 3 (OHS/OSA) PH.  However, cannot rule out group 1 component. She had V/Q scan that was not suggestive of chronic PE and high resolution chest CT that was not suggestive of ILD. ANA, RF, anti-SCL70 all negative.   Chest x-ray April 22, 2020 no edema or airspace opacity.   Assessment and Plan:  OSA and OHS-excellent control compliance on nocturnal CPAP  Diastolic heart failure and pulmonary hypertension sounds as if she is doing well, weights have been stable.  O2 saturations have been very good without increased oxygen demands.  She is increasing her activity levels since shoulder surgery.  Continue follow-up with cardiology  lplan  Patient Instructions  Continue on CPAP  At bedtime  .  Work on healthy weight  Do not drive if sleepy.  Continue with follow up with Cardiology as planned.  Continue on Torsemide .  Low salt diet.  Continue on Adcirca  Follow up with Dr. Hermina Staggers or Keelynn Furgerson NP  in 6  months and As needed   Please contact office for sooner follow up if symptoms do not improve or worsen or seek emergency care       Follow Up Instructions: Follow up in 6 months and As needed      I discussed the assessment and treatment plan with the patient. The patient was provided an opportunity to ask questions and all were answered. The patient agreed with the plan and demonstrated an understanding of the instructions.   The patient was advised to call back or seek an in-person evaluation if the symptoms worsen or if the condition fails to improve as anticipated.  I provided 25  minutes of non-face-to-face time during this encounter.   [name], NP

## 2020-11-04 ENCOUNTER — Telehealth (HOSPITAL_COMMUNITY): Payer: Self-pay | Admitting: Cardiology

## 2020-11-04 ENCOUNTER — Ambulatory Visit (INDEPENDENT_AMBULATORY_CARE_PROVIDER_SITE_OTHER): Payer: PPO | Admitting: Pharmacist

## 2020-11-04 DIAGNOSIS — I10 Essential (primary) hypertension: Secondary | ICD-10-CM | POA: Diagnosis not present

## 2020-11-04 DIAGNOSIS — I482 Chronic atrial fibrillation, unspecified: Secondary | ICD-10-CM

## 2020-11-04 DIAGNOSIS — E785 Hyperlipidemia, unspecified: Secondary | ICD-10-CM | POA: Diagnosis not present

## 2020-11-04 DIAGNOSIS — E039 Hypothyroidism, unspecified: Secondary | ICD-10-CM | POA: Diagnosis not present

## 2020-11-04 NOTE — Telephone Encounter (Signed)
Pt left VM stating she is having a hemorrhoid banding procedure and was told she did not need to hold her Xarelto. Pt wanted to make sure that Dr.McLean was ok with her staying on Xarelto for procedure.   Routed to [place]

## 2020-11-04 NOTE — Telephone Encounter (Signed)
Left detailed VM.

## 2020-11-04 NOTE — Telephone Encounter (Signed)
OK to continue as long as the MD doing the banding thinks ok.

## 2020-11-04 NOTE — Telephone Encounter (Signed)
Pt request medication consult,  regarding Xarelto she can be reached _[phone], please advise

## 2020-11-04 NOTE — Progress Notes (Signed)
Chronic Care Management Pharmacy Note  11/06/2020 Name:  [name] MRN:  [id number] DOB:  08-06-1951  Subjective: [name] is an 70 y.o. year old female who is a primary patient of Panosh, Standley Brooking, MD.  The CCM team was consulted for assistance with disease management and care coordination needs.    Engaged with patient by telephone for follow up visit in response to provider referral for pharmacy case management and/or care coordination services.   Consent to Services:  The patient was given information about Chronic Care Management services, agreed to services, and gave verbal consent prior to initiation of services.  Please see initial visit note for detailed documentation.   Patient Care Team: Panosh, Standley Brooking, MD as PCP - General Gwenlyn Found Pearletha Forge, MD (Cardiology) Garvin Fila, MD (Neurology) Newt Minion, MD as Attending Physician (Orthopedic Surgery) Suella Broad, MD as Consulting Physician (Physical Medicine and Rehabilitation) Netta Cedars, MD as Consulting Physician (Orthopedic Surgery) Juanito Doom, MD as Consulting Physician (Pulmonary Disease) Larey Dresser, MD as Consulting Physician (Cardiology) Viona Gilmore, [facility] as Pharmacist (Pharmacist)  Recent office visits: 04/21/2020 - Presented to Ofilia Neas, LPN for AWV. Mammogram and DEXA were ordered. No medication changes.   02/28/2020- Patient presented to Dr. [name], MD for follow up. Checked labs. All WNL except vit B was high and patient could decrease use. Complains of urinary urgency at night from UTI and currently taking omnicef. No medication changes.  Recent consult visits: 10/15/20 [name], NP (pulmonary): Patient presented for OSA follow up.   10/14/20 Thornton Park, MD (gastro): Patient presented for rectal bleeding with Xarelto. Recommended hemorrhoidal banding.   08/20/20 Shelle Iron, PA (emergeortho): Unable to access notes.  06/04/20 Lennie Odor (dermatology): Patient presented for follow up. Unable to access notes.  05/26/20 Emilio Math, AuD (audiology): Patient presented for sensorineural hearing loss. Recommended hearing aids.  05/20/20 Delana Meyer (ophthalmology): Patient presented for presbyopia follow up. Unable to access notes.  04/08/2020- Pulmonology- Patient presented to [name], NP for 62-monthfollow up for OSA/OHS, respiratory failure, diastolic HF, and pulmonary HTN.  Patient uses 2L of oxygen with heavy activity and remains on CPAP. Patient to continue on diuretics and Adcirca.   04/03/2020- Cardiology- Patient presented to Dr. DLoralie Champagne MD for CHF follow up. Continue torsemide & diamox. Pt is rate controlled and to continue Xarelto and atenolol. Ordered repeat Echo and arranged for BMET.  Hospital visits: 11.8.21 Patient admitted for shoulder replacement.   Objective:  Lab Results  Component Value Date   CREATININE 1.17 (H) 07/14/2020   BUN 21 07/14/2020   GFR 44.78 (L) 02/28/2020   GFRNONAA 50 (L) 07/14/2020   GFRAA 47 (L) 09/19/2018   NA 139 07/14/2020   K 4.0 07/14/2020   CALCIUM 9.3 07/14/2020   CO2 22 07/14/2020    Lab Results  Component Value Date/Time   HGBA1C 5.8 02/28/2020 11:35 AM   HGBA1C 5.8 01/30/2019 09:38 AM   GFR 44.78 (L) 02/28/2020 11:35 AM   GFR 48.08 (L) 10/04/2019 11:05 AM   MICROALBUR 0.2 07/09/2009 08:29 AM    Last diabetic Eye exam: No results found for: HMDIABEYEEXA  Last diabetic Foot exam: No results found for: HMDIABFOOTEX   Lab Results  Component Value Date   CHOL 146 02/28/2020   HDL 54.90 02/28/2020   LDLCALC 71 02/28/2020   LDLDIRECT 139.4 06/30/2010   TRIG 104.0 02/28/2020   CHOLHDL 3 02/28/2020    Hepatic Function  Latest Ref Rng & Units 02/28/2020 06/04/2019 04/08/2019  Total Protein 6.0 - 8.3 g/dL 6.3 6.2 -  Albumin 3.5 - 5.2 g/dL 4.3 4.2 -  AST 0 - 37 U/L 18 18 -  ALT 0 - 35 U/L 7 8 -  Alk Phosphatase 39 - 117 U/L 118(H) 117 128(H)   Total Bilirubin 0.2 - 1.2 mg/dL 1.0 0.6 -  Bilirubin, Direct 0.0 - 0.3 mg/dL 0.2 0.1 -    Lab Results  Component Value Date/Time   TSH 1.22 02/28/2020 11:35 AM   TSH 2.32 01/30/2019 09:38 AM   FREET4 1.26 (H) 11/10/2016 03:04 AM   FREET4 1.12 09/28/2010 09:09 AM    CBC Latest Ref Rng & Units 10/14/2020 07/14/2020 07/08/2020  WBC 4.0 - 10.5 K/uL 7.8 - 7.4  Hemoglobin 12.0 - 15.0 g/dL 11.6(L) 11.2(L) 13.2  Hematocrit 36.0 - 46.0 % 36.3 34.3(L) 41.5  Platelets 150.0 - 400.0 K/uL 157.0 - 131(L)    Lab Results  Component Value Date/Time   VD25OH 58.70 04/08/2019 09:50 AM   VD25OH 52.56 07/20/2016 10:21 AM    Clinical ASCVD: No  The 10-year ASCVD risk score Mikey Bussing DC Jr., et al., 2013) is: 11.5%   Values used to calculate the score:     Age: 89 years     Sex: Female     Is Non-Hispanic African American: No     Diabetic: No     Tobacco smoker: No     Systolic Blood Pressure: 787 mmHg     Is BP treated: Yes     HDL Cholesterol: 54.9 mg/dL     Total Cholesterol: 146 mg/dL    Depression screen [facility] 2/9 04/21/2020 01/24/2018 03/27/2017  Decreased Interest 0 0 0  Down, Depressed, Hopeless 0 0 0  PHQ - 2 Score 0 0 0  Altered sleeping 0 - -  Tired, decreased energy 0 - -  Change in appetite 0 - -  Feeling bad or failure about yourself  0 - -  Trouble concentrating 0 - -  Moving slowly or fidgety/restless 0 - -  Suicidal thoughts 0 - -  PHQ-9 Score 0 - -  Difficult doing work/chores Not difficult at all - -  Some recent data might be hidden     CHA2DS2/VAS Stroke Risk Points  Current as of a minute ago     5 >= 2 Points: High Risk  1 - 1.99 Points: Medium Risk  0 Points: Low Risk    Last Change: N/A      Details    This score determines the patient's risk of having a stroke if the  patient has atrial fibrillation.       Points Metrics  1 Has Congestive Heart Failure:  Yes    Current as of a minute ago  1 Has Vascular Disease:  Yes    Current as of a minute ago  1 Has  Hypertension:  Yes    Current as of a minute ago  1 Age:  58    Current as of a minute ago  0 Has Diabetes:  No    Current as of a minute ago  0 Had Stroke:  No  Had TIA:  No  Had Thromboembolism:  No    Current as of a minute ago  1 Female:  Yes    Current as of a minute ago         Social History   Tobacco Use  Smoking Status Never Smoker  Smokeless Tobacco Never Used   BP Readings from Last 3 Encounters:  10/14/20 120/64  07/14/20 133/65  07/08/20 (!) 126/59   Pulse Readings from Last 3 Encounters:  10/14/20 80  07/14/20 78  07/08/20 67   Wt Readings from Last 3 Encounters:  10/14/20 254 lb (115.2 kg)  07/13/20 250 lb (113.4 kg)  07/08/20 251 lb 4.8 oz (114 kg)    Assessment/Interventions: Review of patient past medical history, allergies, medications, health status, including review of consultants reports, laboratory and other test data, was performed as part of comprehensive evaluation and provision of chronic care management services.   SDOH:  (Social Determinants of Health) assessments and interventions performed: No   CCM Care Plan  Allergies  Allergen Reactions  . Ambien [Zolpidem Tartrate] Other (See Comments)    Amnesia and fall   . Losartan Other (See Comments)    Elevated creatinine and swelling  . Prevacid [Lansoprazole] Swelling  . Adhesive [Tape] Other (See Comments)    Adhesive tape and band aids " irritate, " causes blisters and pulls skin when removing. Okay to use paper tape  . Keflex [Cephalexin] Swelling    Swelling in ankles, feet  . Motrin [Ibuprofen] Other (See Comments)    ANKLE EDEMA   . Prilosec [Omeprazole] Swelling    Swelling in ankles.  . Ace Inhibitors Cough  . Benadryl [Diphenhydramine Hcl] Other (See Comments)    topical  . Spironolactone Rash    Medications Reviewed Today    Reviewed by Viona Gilmore, [facility] (Pharmacist) on 11/04/20 at [place] List Status: <None>  Medication Order Taking? Sig Documenting  Provider Last Dose Status Informant  acetaZOLAMIDE (DIAMOX) 250 MG tablet [id number]  Take 1 tablet (250 mg total) by mouth daily. Larey Dresser, MD  Active   ADCIRCA 20 MG tablet [id number]  TAKE 2 TABLETS BY MOUTH DAILY. CALL [phone] TO REFILL  Patient taking differently: Take 40 mg by mouth daily.   Larey Dresser, MD  Active   allopurinol (ZYLOPRIM) 100 MG tablet [id number]  TAKE 1 TABLET BY MOUTH TWICE A DAY Panosh, Standley Brooking, MD  Active   atenolol (TENORMIN) 50 MG tablet [id number]  Take 1 tablet (50 mg total) by mouth daily. Must be seen for further refills Larey Dresser, MD  Active   betamethasone dipropionate 0.05 % cream [id number]  APPLY SPARINGLY TO AFFECTED AREA TWICE A DAY [provider]  Active Self  Cholecalciferol (VITAMIN D) 125 MCG (5000 UT) CAPS [id number]  Take 5,000 Units by mouth 3 (three) times a week.  [provider]  Active Self  Cyanocobalamin 5000 MCG TBDP [id number]  Take 5,000 mcg by mouth 3 (three) times a week.  [provider]  Active Self  diclofenac sodium (VOLTAREN) 1 % GEL [id number]  Apply 2 g topically 2 (two) times daily as needed (for arthritis). [provider]  Active Self  levothyroxine (SYNTHROID) 75 MCG tablet [id number]  Take 1 tablet (75 mcg total) by mouth daily. Panosh, Standley Brooking, MD  Active Self        Discontinued 11/04/20 0914 (Completed Course)   OMEGA-3 KRILL OIL PO [id number]  Take 1 capsule by mouth 3 (three) times a week. [provider]  Active Self        Discontinued 11/04/20 [phone] (Completed Course)   potassium chloride (KLOR-CON) 10 MEQ tablet [id number]  Take 2 tablets (20 mEq total) by mouth 2 (two) times daily. Larey Dresser, MD  Active  rosuvastatin (CRESTOR) 5 MG tablet [id number]  TAKE 1 TABLET (5 MG TOTAL) BY MOUTH DAILY. MUST BE SEEN IN OFFICE FOR FURTHER REFILLS Larey Dresser, MD  Active         Discontinued 11/18/11 1032   torsemide (DEMADEX) 20 MG tablet [id number]  TAKE 2  TABLETS (40 MG TOTAL) BY MOUTH 2 (TWO) TIMES DAILY.  Patient taking differently: Take 20-40 mg by mouth See admin instructions. Take 40 mg by mouth in the morning and 20 mg in the afternoon as needed for fluid   Larey Dresser, MD  Active Self  traMADol (ULTRAM) 50 MG tablet [id number]  Take 1 tablet (50 mg total) by mouth 3 (three) times daily as needed. Panosh, Standley Brooking, MD  Active Self  XARELTO 20 MG TABS tablet [id number]  TAKE 1 TABLET (20 MG TOTAL) BY MOUTH DAILY WITH SUPPER. MUST BE SEEN FOR FURTHER REFILLS Larey Dresser, MD  Active           Patient Active Problem List   Diagnosis Date Noted  . S/P shoulder replacement, right 07/13/2020  . Preoperative clearance 04/08/2020  . Rib pain on right side 08/15/2018  . Degeneration of lumbar intervertebral disc 05/03/2018  . Essential hypertension 01/10/2018  . Pulmonary hypertension, primary ([place])   . Aortic atherosclerosis ([place]) 11/06/2016  . Obesity hypoventilation syndrome ([place]) 11/05/2016  . Coronary artery calcification seen on CAT scan 11/05/2016  . Pleural effusion, right 11/04/2016  . Chronic diastolic (congestive) heart failure ([place]) 11/04/2016  . Pulmonary hypertension ([place]) 09/27/2016  . Hypoxemia 09/27/2016  . Gout 01/20/2014  . Abnormal LFTs 05/16/2012  . Vitamin B12 deficiency 05/16/2012  . Hypertensive heart disease   . B12 deficiency anemia 03/26/2012  . Shortness of breath 02/17/2012  . Prediabetes 10/02/2011  . 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 Chronic anticoagulation 06/28/2011  . Chronic atrial fibrillation ([place]) 01/21/2011  . OSA (obstructive sleep apnea) 01/21/2011  . History of stroke 11/19/2010  . DJD (degenerative joint disease) 05/17/2010  . Hyperlipidemia 04/09/2007  . Hypothyroidism 02/28/2007  . Morbid obesity ([place]) 02/28/2007    Immunization History  Administered Date(s) Administered  . Influenza Split 06/28/2011, 05/16/2012, 05/25/2020  . Influenza Whole 06/02/2008, 06/24/2009,  05/17/2010  . Influenza, High Dose Seasonal PF 05/25/2015, 06/10/2016, 04/27/2017, 06/01/2018, 05/02/2019, 06/06/2019  . Influenza,inj,Quad PF,6+ Mos 05/21/2013, 06/10/2014  . Influenza-Unspecified 04/27/2017  . PFIZER(Purple Top)SARS-COV-2 Vaccination 09/26/2019, 10/14/2019, 05/25/2020  . PPD Test 05/02/2016  . Pneumococcal Conjugate-13 07/16/2013  . Pneumococcal Polysaccharide-23 04/22/2015  . Td 09/05/2001  . Tdap 07/16/2013  . Zoster 09/27/2011  . Zoster Recombinat (Shingrix) 12/11/2017, 06/01/2018    Conditions to be addressed/monitored:  Hypertension, Hyperlipidemia, Atrial Fibrillation, Heart Failure, Hypothyroidism, Osteoarthritis, Gout and history of stroke and pre-diabetes  Care Plan : [place]  Updates made by Viona Gilmore, [place] since 11/06/2020 12:00 AM    Problem: Problem: Hypertension, Hyperlipidemia, Atrial Fibrillation, Heart Failure, Hypothyroidism, Osteoarthritis, Gout and history of stroke and pre-diabetes     Long-Range Goal: Patient-Specific Goal   Start Date: 11/03/2020  Expected End Date: 11/03/2021  This Visit's Progress: On track  Priority: High  Note:   Current Barriers:  . Unable to independently monitor therapeutic efficacy . Cost of brand name medication is high  Pharmacist Clinical Goal(s):  Marland Kitchen Over the next 240 days, patient will verbalize ability to afford treatment regimen . achieve adherence to monitoring guidelines and medication adherence to achieve therapeutic efficacy through collaboration with PharmD and provider.  Interventions: . 1:1 collaboration with Panosh, Standley Brooking, MD regarding development and update of comprehensive plan of care as evidenced by provider attestation and co-signature . Inter-disciplinary care team collaboration (see longitudinal plan of care) . Comprehensive medication review performed; medication list updated in electronic medical record  Hypertension (BP goal <130/80) -Controlled -Current  treatment: . atenolol 52m, 1 tablet daily -Medications previously tried: lisinopril (dropping BP too low) -Current home readings: highest 125/70, as low as 110/60 -Current dietary habits: Patient notes diet consists of lots of fruits and vegetables during the summer especially.  -Current exercise habits: patient doing chair exercises - 2-3 times a week, plans to go back to the gym with new step (like an elyptical)  -Denies hypotensive/hypertensive symptoms -Educated on Importance of home blood pressure monitoring; -Counseled to monitor BP at home weekly, document, and provide log at future appointments -Counseled on diet and exercise extensively Recommended to continue current medication  Hyperlipidemia/history of stroke: (LDL goal < 70) -Controlled -Current treatment:  Rosuvastatin 5 mg, 1 tablet daily  -Medications previously tried: simvastatin  -Current dietary patterns: did not discuss -Current exercise habits: patient doing chair exercises - 2-3 times a week, plans to go back to the gym with new step (like an elyptical) -Educated on Cholesterol goals;  Exercise goal of 150 minutes per week; -Recommended to continue current medication  Pre-diabetes (A1c goal <6.5%) -Controlled -Current medications: . No medications -Medications previously tried: none  -Current home glucose readings: does not monitor at home -Denies hypoglycemic/hyperglycemic symptoms -Current meal patterns: did not discuss -Current exercise: patient doing chair exercises - 2-3 times a week, plans to go back to the gym with new step (like an elyptical) -Educated on Exercise goal of 150 minutes per week; Carbohydrate counting and/or plate method -Counseled to check feet daily and get yearly eye exams -Counseled on diet and exercise extensively  Heart Failure (Goal: manage symptoms and prevent exacerbations) -Controlled -Last ejection fraction: 60-65% (Date: 04/2020) -HF type: Diastolic -NYHA Class: I (no  actitivty limitation) -Current treatment: . acetazolamide 2561m 1 tablet daily  . torsemide 2022m2 tablets in the AM, 1 in the afternoon as needed  . potassium chloride 25m42m2 tablets twice daily -Medications previously tried: furosemide -Current dietary habits: patient is limiting salt intake; she cooks with it sometimes but doesn't buy processed foods and will check labels -Current exercise habits: not consistently exercising -Educated on Importance of weighing daily; if you gain more than 3 pounds in one day or 5 pounds in one week, call cardiologist -Counseled on diet and exercise extensively Recommended to continue current medication   Atrial Fibrillation (Goal: prevent stroke and major bleeding) -Controlled -CHADSVASC: 5 -Current treatment: . Rate control: atenolol 50mg70mtablet daily . Anticoagulation: Xarelto 20mg,37mablet every day with supper -Medications previously tried: none -Home BP and HR readings: 65-70 BPM -Counseled on bleeding risk associated with Xarelto and importance of self-monitoring for signs/symptoms of bleeding; -Recommended to continue current medication Counseled on recommendations for not holding Xarelto or for 1 day prior to banding procedure Assessed patient finances. Will mail patient assistance application for Xarelto.  Hypothyroidism (Goal: TSH 0.35-4.5) -Controlled -Current treatment  . levothyroxine 75mcg,87mablet daily  -Medications previously tried: none  -Recommended to continue current medication  Gout (Goal: uric acid < 6 and prevent flare ups) -Controlled -Current treatment   allopurinol 100mg, 153mlet twice daily   colchicine 0.6mg, 1 t14met as needed for flare ups -Medications previously tried: none  -Recommended to continue  current medication  Pulmonary hypertension (Goal: minimize symptoms) -Controlled -Current treatment  . tadalafil (Adcirca) 15m, 2 tablets once daily  -Medications previously tried: none   -Recommended to continue current medication  Osteoarthritis/muscle spasms (Goal: minimize pain) -Controlled -Current treatment   diclofenac 1% gel, 2 g two times daily as needed for arthritis (patient notes using very often)   tramadol 553m 1 tablet three times daily as needed (patient notes using very seldom)  -Medications previously tried: methocarbamol (no longer needed)  -Recommended to continue current medication   Health Maintenance -Vaccine gaps: none -Current therapy:   Betamethasone cream 0.05% apply twice daily as needed  Amoxicillin 500 mg capsules PRN for dental procedures  Vitamin B12 5000 units three times weekly -Educated on Cost vs benefit of each product must be carefully weighed by individual consumer -Patient is satisfied with current therapy and denies issues -Recommended to continue current medication  Patient Goals/Self-Care Activities . Over the next 240 days, patient will:  - take medications as prescribed check blood pressure weekly, document, and provide at future appointments  Follow Up Plan: Telephone follow up appointment with care management team member scheduled for: 8 months       Medication Assistance: Tadalafil obtained through [employer] assistance program.  Enrollment ends until grant runs out  Patient's [facility] pharmacy [facility]:  [id number] [address]: [phone]: [id number] [address] [address] [place]: [phone]: [phone] [place] [address] [place] [id number]: [phone]: [phone] pill box? Yes - pill box for 2 weeks Pt endorses 100% compliance  We discussed: Current pharmacy is preferred  with insurance plan and patient is satisfied with pharmacy services Patient decided to: Continue current medication management strategy  Care Plan and Follow Up Patient Decision:  Patient agrees to Care Plan and Follow-up.  Plan: Telephone follow up appointment with care management team member scheduled for:  8 months  MadeJeni SallesarmD [phone] - morning time is best

## 2020-11-04 NOTE — Telephone Encounter (Signed)
Left vm for pt to return my call.

## 2020-11-06 NOTE — Patient Instructions (Addendum)
Hi [name],  It was great getting to speak to you again today! Just as a reminder, I think it would be beneficial for you to check your blood pressure weekly just to make sure your medications are working properly. Also, don't forget to look into getting your shingles shot at the pharmacy!  Best, [name]  [name], PharmD [name] Clinical Pharmacist [name]   Visit Information  Goals Addressed   None    Patient Care Plan: CCM Pharmacy Care Plan    Problem Identified: Problem: Hypertension, Hyperlipidemia, Atrial Fibrillation, Heart Failure, Hypothyroidism, Osteoarthritis, Gout and history of stroke and pre-diabetes     Long-Range Goal: Patient-Specific Goal   Start Date: 11/03/2020  Expected End Date: 11/03/2021  This Visit's Progress: On track  Priority: High  Note:   Current Barriers:  . Unable to independently monitor therapeutic efficacy . Cost of brand name medication is high  Pharmacist Clinical Goal(s):  Marland Kitchen Over the next 240 days, patient will verbalize ability to afford treatment regimen . achieve adherence to monitoring guidelines and medication adherence to achieve therapeutic efficacy through collaboration with PharmD and provider.   Interventions: . 1:1 collaboration with [name], Standley Brooking, MD regarding development and update of comprehensive plan of care as evidenced by provider attestation and co-signature . Inter-disciplinary care team collaboration (see longitudinal plan of care) . Comprehensive medication review performed; medication list updated in electronic medical record  Hypertension (BP goal <130/80) -Controlled -Current treatment: . atenolol 57m, 1 tablet daily -Medications previously tried: lisinopril (dropping BP too low) -Current home readings: highest 125/70, as low as 110/60 -Current dietary habits: Patient notes diet consists of lots of fruits and vegetables during the summer especially.  -Current exercise  habits: patient doing chair exercises - 2-3 times a week, plans to go back to the gym with new step (like an elyptical)  -Denies hypotensive/hypertensive symptoms -Educated on Importance of home blood pressure monitoring; -Counseled to monitor BP at home weekly, document, and provide log at future appointments -Counseled on diet and exercise extensively Recommended to continue current medication  Hyperlipidemia/history of stroke: (LDL goal < 70) -Controlled -Current treatment:  Rosuvastatin 5 mg, 1 tablet daily  -Medications previously tried: simvastatin  -Current dietary patterns: did not discuss -Current exercise habits: patient doing chair exercises - 2-3 times a week, plans to go back to the gym with new step (like an elyptical) -Educated on Cholesterol goals;  Exercise goal of 150 minutes per week; -Recommended to continue current medication  Pre-diabetes (A1c goal <6.5%) -Controlled -Current medications: . No medications -Medications previously tried: none  -Current home glucose readings: does not monitor at home -Denies hypoglycemic/hyperglycemic symptoms -Current meal patterns: did not discuss -Current exercise: patient doing chair exercises - 2-3 times a week, plans to go back to the gym with new step (like an elyptical) -Educated on Exercise goal of 150 minutes per week; Carbohydrate counting and/or plate method -Counseled to check feet daily and get yearly eye exams -Counseled on diet and exercise extensively  Heart Failure (Goal: manage symptoms and prevent exacerbations) -Controlled -Last ejection fraction: 60-65% (Date: 04/2020) -HF type: Diastolic -NYHA Class: I (no actitivty limitation) -Current treatment: . acetazolamide 2512m 1 tablet daily  . torsemide 2060m2 tablets in the AM, 1 in the afternoon as needed  . potassium chloride 2m77m2 tablets twice daily -Medications previously tried: furosemide -Current dietary habits: patient is limiting salt intake;  she cooks with it sometimes but doesn't buy processed foods and will check  labels -Current exercise habits: not consistently exercising -Educated on Importance of weighing daily; if you gain more than 3 pounds in one day or 5 pounds in one week, call cardiologist -Counseled on diet and exercise extensively Recommended to continue current medication   Atrial Fibrillation (Goal: prevent stroke and major bleeding) -Controlled -CHADSVASC: 5 -Current treatment: . Rate control: atenolol 93m, 1 tablet daily . Anticoagulation: Xarelto 264m 1 tablet every day with supper -Medications previously tried: none -Home BP and HR readings: 65-70 BPM -Counseled on bleeding risk associated with Xarelto and importance of self-monitoring for signs/symptoms of bleeding; -Recommended to continue current medication Counseled on recommendations for not holding Xarelto or for 1 day prior to banding procedure Assessed patient finances. Will mail patient assistance application for Xarelto.  Hypothyroidism (Goal: TSH 0.35-4.5) -Controlled -Current treatment  . levothyroxine 7559m 1 tablet daily  -Medications previously tried: none  -Recommended to continue current medication  Gout (Goal: uric acid < 6 and prevent flare ups) -Controlled -Current treatment   allopurinol 100m59m tablet twice daily   colchicine 0.6mg,86mtablet as needed for flare ups -Medications previously tried: none  -Recommended to continue current medication  Pulmonary hypertension (Goal: minimize symptoms) -Controlled -Current treatment  . tadalafil (Adcirca) 20mg,55mablets once daily  -Medications previously tried: none  -Recommended to continue current medication  Osteoarthritis/muscle spasms (Goal: minimize pain) -Controlled -Current treatment   diclofenac 1% gel, 2 g two times daily as needed for arthritis (patient notes using very often)   tramadol 50mg, 54mblet three times daily as needed (patient notes using very  seldom)  -Medications previously tried: methocarbamol (no longer needed)  -Recommended to continue current medication   Health Maintenance -Vaccine gaps: none -Current therapy:   Betamethasone cream 0.05% apply twice daily as needed  Amoxicillin 500 mg capsules PRN for dental procedures  Vitamin B12 5000 units three times weekly -Educated on Cost vs benefit of each product must be carefully weighed by individual consumer -Patient is satisfied with current therapy and denies issues -Recommended to continue current medication  Patient Goals/Self-Care Activities . Over the next 240 days, patient will:  - take medications as prescribed check blood pressure weekly, document, and provide at future appointments  Follow Up Plan: Telephone follow up appointment with care management team member scheduled for: 8 months       Patient verbalizes understanding of instructions provided today and agrees to view in MyChartFossilphone follow up appointment with pharmacy team member scheduled for: 8 months  [facility] Take Your Blood Pressure Blood pressure measures how strongly your blood is pressing against the walls of your arteries. Arteries are blood vessels that carry blood from your heart throughout your body. You can take your blood pressure at home with a machine. You may need to check your blood pressure at home:  To check if you have high blood pressure (hypertension).  To check your blood pressure over time.  To make sure your blood pressure medicine is working. Supplies needed:  Blood pressure machine, or monitor.  Dining room chair to sit in.  Table or desk.  Small notebook.  Pencil or pen. How to prepare Avoid these things for 30 minutes before checking your blood pressure:  Having drinks with caffeine in them, such as coffee or tea.  Drinking alcohol.  Eating.  Smoking.  Exercising. Do these things five minutes before checking your  blood pressure:  Go to the bathroom and pee (urinate).  Sit in a dining chair. Do not sit in a soft couch or an armchair.  Be quiet. Do not talk. How to take your blood pressure Follow the instructions that came with your machine. If you have a digital blood pressure monitor, these may be the instructions: 1. Sit up straight. 2. Place your feet on the floor. Do not cross your ankles or legs. 3. Rest your left arm at the level of your heart. You may rest it on a table, desk, or chair. 4. Pull up your shirt sleeve. 5. Wrap the blood pressure cuff around the upper part of your left arm. The cuff should be 1 inch (2.5 cm) above your elbow. It is best to wrap the cuff around bare skin. 6. Fit the cuff snugly around your arm. You should be able to place only one finger between the cuff and your arm. 7. Place the cord so that it rests in the bend of your elbow. 8. Press the power button. 9. Sit quietly while the cuff fills with air and loses air. 10. Write down the numbers on the screen. 11. Wait 2-3 minutes and then repeat steps 1-10.   What do the numbers mean? Two numbers make up your blood pressure. The first number is called systolic pressure. The second is called diastolic pressure. An example of a blood pressure reading is "120 over 80" (or 120/80). If you are an adult and do not have a medical condition, use this guide to find out if your blood pressure is normal: Normal  First number: below 120.  Second number: below 80. Elevated  First number: 120-129.  Second number: below 80. Hypertension stage 1  First number: 130-139.  Second number: 80-89. Hypertension stage 2  First number: 140 or above.  Second number: 46 or above. Your blood pressure is above normal even if only the top or bottom number is above normal. Follow these instructions at home:  Check your blood pressure as often as your doctor tells you to.  Check your blood pressure at the same time every  day.  Take your monitor to your next doctor's appointment. Your doctor will: ? Make sure you are using it correctly. ? Make sure it is working right.  Make sure you understand what your blood pressure numbers should be.  Tell your doctor if your medicine is causing side effects.  Keep all follow-up visits as told by your doctor. This is important. General tips:  You will need a blood pressure machine, or monitor. Your doctor can suggest a monitor. You can buy one at a drugstore or online. When choosing one: ? Choose one with an arm cuff. ? Choose one that wraps around your upper arm. Only one finger should fit between your arm and the cuff. ? Do not choose one that measures your blood pressure from your wrist or finger. Where to find more information American Heart Association: www.heart.org Contact a doctor if:  Your blood pressure keeps being high. Get help right away if:  Your first blood pressure number is higher than 180.  Your second blood pressure number is higher than 120. Summary  Check your blood pressure at the same time every day.  Avoid caffeine, alcohol, smoking, and exercise for 30 minutes before checking your blood pressure.  Make sure you understand what your blood pressure numbers should be. This information is not intended to replace advice given to you by your health care provider. Make sure you discuss any questions you have  with your health care provider. Document Revised: 08/16/2019 Document Reviewed: 08/16/2019 Elsevier Patient Education  2021 [employer].

## 2020-11-10 ENCOUNTER — Ambulatory Visit: Payer: PPO | Admitting: Gastroenterology

## 2020-11-10 ENCOUNTER — Encounter: Payer: Self-pay | Admitting: Gastroenterology

## 2020-11-10 VITALS — BP 118/64 | HR 63 | Ht 61.0 in | Wt 250.0 lb

## 2020-11-10 DIAGNOSIS — K648 Other hemorrhoids: Secondary | ICD-10-CM | POA: Diagnosis not present

## 2020-11-10 DIAGNOSIS — K640 First degree hemorrhoids: Secondary | ICD-10-CM

## 2020-11-10 NOTE — Progress Notes (Signed)
PROCEDURE NOTE: The patient presents with symptomatic bleeding grade I hemorrhoids, requesting rubber band ligation of their hemorrhoidal disease.  All risks, benefits and alternative forms of therapy were described and informed consent was obtained.    She is maintained on Xarelto and the decision was made to remain on Xarelto for the banding.  She understands her increased risk of bleeding with ongoing anticoagulation and she consents to proceed. She decided on her own to hold her Xarelto yesterday and she is advised to resume Xarelto today. She is advised not to hold Xarelto for future banding days.   The anorectum was pre-medicated during digital exam with 0.125% NTG and 5% lidocaine.  In the Left Lateral Decubitus position anoscopic examination revealed grade I hemorrhoids in the RA > LL > RP positions.   The decision was made to band the RA internal hemorrhoid, and the [place] was used to perform band ligation without complication.  Digital anorectal examination was then performed to assure proper positioning of the band and to adjust the banded tissue as required. No adjustment was needed.  No complications were encountered and the patient tolerated the procedure well.  Dietary and behavioral recommendations were given and along with follow-up instructions.   Return for possible additional hemorrhoid banding in 2 - 3 weeks as required.  High fiber diet, Benefiber daily and at least 8 glasses of water daily. The patient was discharged home without pain or other post procedure problems.   cc: [place], MD

## 2020-11-10 NOTE — Patient Instructions (Signed)
Normal BMI (Body Mass Index- based on height and weight) is between 23 and 30. Your BMI today is Body mass index is 47.24 kg/m. Marland Kitchen Please consider follow up  regarding your BMI with your Primary Care Provider.  HEMORRHOID BANDING PROCEDURE    FOLLOW-UP CARE   1. The procedure you have had should have been relatively painless since the banding of the area involved does not have nerve endings and there is no pain sensation.  The rubber band cuts off the blood supply to the hemorrhoid and the band may fall off as soon as 48 hours after the banding (the band may occasionally be seen in the toilet bowl following a bowel movement). You may notice a temporary feeling of fullness in the rectum which should respond adequately to plain Tylenol or Motrin.  2. Following the banding, avoid strenuous exercise that evening and resume full activity the next day.  A sitz bath (soaking in a warm tub) or bidet is soothing, and can be useful for cleansing the area after bowel movements.     3. To avoid constipation, take two tablespoons of natural wheat bran, natural oat bran, flax, Benefiber or any over the counter fiber supplement and increase your water intake to 7-8 glasses daily.    4. Unless you have been prescribed anorectal medication, do not put anything inside your rectum for two weeks: No suppositories, enemas, fingers, etc.  5. Occasionally, you may have more bleeding than usual after the banding procedure.  This is often from the untreated hemorrhoids rather than the treated one.  Don't be concerned if there is a tablespoon or so of blood.  If there is more blood than this, lie flat with your bottom higher than your head and apply an ice pack to the area. If the bleeding does not stop within a half an hour or if you feel faint, call our office at [phone] or go to the emergency room.  6. Problems are not common; however, if there is a substantial amount of bleeding, severe pain, chills, fever or  difficulty passing urine (very rare) or other problems, you should call us at [phone] or report to the nearest emergency room.  7. Do not stay seated continuously for more than 2-3 hours for a day or two after the procedure.  Tighten your buttock muscles 10-15 times every two hours and take 10-15 deep breaths every 1-2 hours.  Do not spend more than a few minutes on the toilet if you cannot empty your bowel; instead re-visit the toilet at a later time.    Thank you for choosing me and [facility].  Pricilla Riffle. Dagoberto Ligas., MD., [facility]

## 2020-11-11 ENCOUNTER — Telehealth: Payer: Self-pay | Admitting: Pharmacist

## 2020-11-11 NOTE — Chronic Care Management (AMB) (Signed)
    Chronic Care Management Pharmacy Assistant   Name: [name]  MRN: [id number] DOB: [birth year]  Reason for Encounter: Patient Assistance Coordination   11/11/2020- Patient assistance forms filled out for Xarelto 20 mg with [employer] patient assistance program.   11/13/2020- Called patient to inform that I am placing forms in the mail. No answer left message to be on the look out for application, I will highlight and tag all places she will need to sign or fill out and what income documents are needed. Informed to take application to Dr Aundra Dubin (Cardiology) office once she has completed her portion for him to sign and fax. [name], CPP notified.   Medications: Outpatient Encounter Medications as of 11/11/2020  Medication Sig  . acetaZOLAMIDE (DIAMOX) 250 MG tablet Take 1 tablet (250 mg total) by mouth daily.  . ADCIRCA 20 MG tablet TAKE 2 TABLETS BY MOUTH DAILY. CALL [phone] TO REFILL (Patient taking differently: Take 40 mg by mouth daily.)  . allopurinol (ZYLOPRIM) 100 MG tablet TAKE 1 TABLET BY MOUTH TWICE A DAY  . atenolol (TENORMIN) 50 MG tablet Take 1 tablet (50 mg total) by mouth daily. Must be seen for further refills  . Cholecalciferol (VITAMIN D) 125 MCG (5000 UT) CAPS Take 5,000 Units by mouth 3 (three) times a week.   . Cyanocobalamin 5000 MCG TBDP Take 5,000 mcg by mouth 3 (three) times a week.   . diclofenac sodium (VOLTAREN) 1 % GEL Apply 2 g topically 2 (two) times daily as needed (for arthritis).  Marland Kitchen levothyroxine (SYNTHROID) 75 MCG tablet Take 1 tablet (75 mcg total) by mouth daily.  . potassium chloride (KLOR-CON) 10 MEQ tablet Take 2 tablets (20 mEq total) by mouth 2 (two) times daily.  . rosuvastatin (CRESTOR) 5 MG tablet TAKE 1 TABLET (5 MG TOTAL) BY MOUTH DAILY. MUST BE SEEN IN OFFICE FOR FURTHER REFILLS  . torsemide (DEMADEX) 20 MG tablet TAKE 2 TABLETS (40 MG TOTAL) BY MOUTH 2 (TWO) TIMES DAILY. (Patient taking differently: Take 20-40 mg by  mouth See admin instructions. Take 40 mg by mouth in the morning and 20 mg in the afternoon as needed for fluid)  . traMADol (ULTRAM) 50 MG tablet Take 1 tablet (50 mg total) by mouth 3 (three) times daily as needed.  Alveda Reasons 20 MG TABS tablet TAKE 1 TABLET (20 MG TOTAL) BY MOUTH DAILY WITH SUPPER. MUST BE SEEN FOR FURTHER REFILLS  . [DISCONTINUED] TOPAMAX 50 MG tablet Take 50 mg by mouth.   No facility-administered encounter medications on file as of 11/11/2020.    Star Rating Drugs: Rosuvastatin 5 mg- Last filled 09/20/2020 at [facility]: Pattricia Boss, [place] Pharmacist Assistant [phone]

## 2020-11-26 ENCOUNTER — Other Ambulatory Visit: Payer: Self-pay | Admitting: Internal Medicine

## 2020-11-30 ENCOUNTER — Ambulatory Visit: Payer: PPO | Admitting: Gastroenterology

## 2020-11-30 ENCOUNTER — Encounter: Payer: Self-pay | Admitting: Gastroenterology

## 2020-11-30 VITALS — BP 108/64 | HR 65

## 2020-11-30 DIAGNOSIS — K640 First degree hemorrhoids: Secondary | ICD-10-CM | POA: Diagnosis not present

## 2020-11-30 DIAGNOSIS — G4733 Obstructive sleep apnea (adult) (pediatric): Secondary | ICD-10-CM | POA: Diagnosis not present

## 2020-11-30 DIAGNOSIS — K648 Other hemorrhoids: Secondary | ICD-10-CM

## 2020-11-30 DIAGNOSIS — J9610 Chronic respiratory failure, unspecified whether with hypoxia or hypercapnia: Secondary | ICD-10-CM | POA: Diagnosis not present

## 2020-11-30 NOTE — Patient Instructions (Signed)
HEMORRHOID BANDING PROCEDURE    FOLLOW-UP CARE   1. The procedure you have had should have been relatively painless since the banding of the area involved does not have nerve endings and there is no pain sensation.  The rubber band cuts off the blood supply to the hemorrhoid and the band may fall off as soon as 48 hours after the banding (the band may occasionally be seen in the toilet bowl following a bowel movement). You may notice a temporary feeling of fullness in the rectum which should respond adequately to plain Tylenol or Motrin.  2. Following the banding, avoid strenuous exercise that evening and resume full activity the next day.  A sitz bath (soaking in a warm tub) or bidet is soothing, and can be useful for cleansing the area after bowel movements.     3. To avoid constipation, take two tablespoons of natural wheat bran, natural oat bran, flax, Benefiber or any over the counter fiber supplement and increase your water intake to 7-8 glasses daily.    4. Unless you have been prescribed anorectal medication, do not put anything inside your rectum for two weeks: No suppositories, enemas, fingers, etc.  5. Occasionally, you may have more bleeding than usual after the banding procedure.  This is often from the untreated hemorrhoids rather than the treated one.  Don't be concerned if there is a tablespoon or so of blood.  If there is more blood than this, lie flat with your bottom higher than your head and apply an ice pack to the area. If the bleeding does not stop within a half an hour or if you feel faint, call our office at [phone] or go to the emergency room.  6. Problems are not common; however, if there is a substantial amount of bleeding, severe pain, chills, fever or difficulty passing urine (very rare) or other problems, you should call us at [phone] or report to the nearest emergency room.  7. Do not stay seated continuously for more than 2-3 hours for a day or two  after the procedure.  Tighten your buttock muscles 10-15 times every two hours and take 10-15 deep breaths every 1-2 hours.  Do not spend more than a few minutes on the toilet if you cannot empty your bowel; instead re-visit the toilet at a later time.    Thank you for choosing me and [facility].  Pricilla Riffle. Dagoberto Ligas., MD., [facility]

## 2020-11-30 NOTE — Progress Notes (Signed)
PROCEDURE NOTE: The patient presents with symptomatic bleeding grade 1 hemorrhoids, requesting rubber band ligation of their hemorrhoidal disease.  All risks, benefits and alternative forms of therapy were described and informed consent was obtained. Bleeding has minimally improved post banding #1 of RA.   She is maintained on Xarelto. The decision was made to remain on Xarelto for the banding to reduce the risk of potential cardiovascular events if Xarelto were held. She understands, accepts the increased risk of bleeding post banding with ongoing anticoagulation and she consents to proceed.   The anorectum was pre-medicated during digital exam with 0.125% NTG and 5% lidocaine.  The decision was made to band the LL internal hemorrhoid, and the [place] was used to perform band ligation without complication.  Digital anorectal examination was then performed to assure proper positioning of the band and to adjust the banded tissue as required. No adjustment was needed.  No complications were encountered and the patient tolerated the procedure well.  Dietary and behavioral recommendations were given and along with follow-up instructions.   Return for possible additional hemorrhoid banding in 2 - 3 weeks as required.  High fiber diet, Benefiber daily and at least 8 glasses of water daily. The patient was discharged home without pain or other post procedure problems.

## 2020-12-14 ENCOUNTER — Encounter: Payer: Self-pay | Admitting: Internal Medicine

## 2020-12-14 ENCOUNTER — Other Ambulatory Visit: Payer: Self-pay

## 2020-12-14 ENCOUNTER — Ambulatory Visit (INDEPENDENT_AMBULATORY_CARE_PROVIDER_SITE_OTHER): Payer: PPO | Admitting: Internal Medicine

## 2020-12-14 VITALS — BP 126/76 | HR 66 | Temp 97.7°F | Ht 61.0 in | Wt 256.8 lb

## 2020-12-14 DIAGNOSIS — E039 Hypothyroidism, unspecified: Secondary | ICD-10-CM | POA: Diagnosis not present

## 2020-12-14 DIAGNOSIS — R269 Unspecified abnormalities of gait and mobility: Secondary | ICD-10-CM

## 2020-12-14 DIAGNOSIS — E785 Hyperlipidemia, unspecified: Secondary | ICD-10-CM | POA: Diagnosis not present

## 2020-12-14 DIAGNOSIS — I10 Essential (primary) hypertension: Secondary | ICD-10-CM

## 2020-12-14 DIAGNOSIS — Z8739 Personal history of other diseases of the musculoskeletal system and connective tissue: Secondary | ICD-10-CM | POA: Diagnosis not present

## 2020-12-14 DIAGNOSIS — Z7901 Long term (current) use of anticoagulants: Secondary | ICD-10-CM

## 2020-12-14 DIAGNOSIS — D649 Anemia, unspecified: Secondary | ICD-10-CM | POA: Diagnosis not present

## 2020-12-14 DIAGNOSIS — Z79899 Other long term (current) drug therapy: Secondary | ICD-10-CM | POA: Diagnosis not present

## 2020-12-14 LAB — LIPID PANEL
Cholesterol: 133 mg/dL (ref 0–200)
HDL: 55 mg/dL (ref >39.00)
LDL Cholesterol: 59 mg/dL (ref 0–99)
NonHDL: 78.11
Total CHOL/HDL Ratio: 2
Triglycerides: 98 mg/dL (ref 0.0–149.0)
VLDL: 19.6 mg/dL (ref 0.0–40.0)

## 2020-12-14 LAB — CBC WITH DIFFERENTIAL/PLATELET
Basophils Absolute: 0 10*3/uL (ref 0.0–0.1)
Basophils Relative: 0.6 % (ref 0.0–3.0)
Eosinophils Absolute: 0.2 10*3/uL (ref 0.0–0.7)
Eosinophils Relative: 3 % (ref 0.0–5.0)
HCT: 38.6 % (ref 36.0–46.0)
Hemoglobin: 12.3 g/dL (ref 12.0–15.0)
Lymphocytes Relative: 14.7 % (ref 12.0–46.0)
Lymphs Abs: 0.8 10*3/uL (ref 0.7–4.0)
MCHC: 31.9 g/dL (ref 30.0–36.0)
MCV: 95 fl (ref 78.0–100.0)
Monocytes Absolute: 0.4 10*3/uL (ref 0.1–1.0)
Monocytes Relative: 6.8 % (ref 3.0–12.0)
Neutro Abs: 4.3 10*3/uL (ref 1.4–7.7)
Neutrophils Relative %: 74.9 % (ref 43.0–77.0)
Platelets: 129 10*3/uL — ABNORMAL LOW (ref 150.0–400.0)
RBC: 4.06 Mil/uL (ref 3.87–5.11)
RDW: 19.1 % — ABNORMAL HIGH (ref 11.5–15.5)
WBC: 5.7 10*3/uL (ref 4.0–10.5)

## 2020-12-14 LAB — BASIC METABOLIC PANEL
BUN: 24 mg/dL — ABNORMAL HIGH (ref 6–23)
CO2: 25 mEq/L (ref 19–32)
Calcium: 9 mg/dL (ref 8.4–10.5)
Chloride: 111 mEq/L (ref 96–112)
Creatinine, Ser: 1.34 mg/dL — ABNORMAL HIGH (ref 0.40–1.20)
GFR: 39.95 mL/min — ABNORMAL LOW (ref >60.00)
Glucose, Bld: 103 mg/dL — ABNORMAL HIGH (ref 70–99)
Potassium: 4.3 mEq/L (ref 3.5–5.1)
Sodium: 145 mEq/L (ref 135–145)

## 2020-12-14 LAB — HEPATIC FUNCTION PANEL
ALT: 6 U/L (ref 0–35)
AST: 17 U/L (ref 0–37)
Albumin: 4.1 g/dL (ref 3.5–5.2)
Alkaline Phosphatase: 105 U/L (ref 39–117)
Bilirubin, Direct: 0.1 mg/dL (ref 0.0–0.3)
Total Bilirubin: 0.6 mg/dL (ref 0.2–1.2)
Total Protein: 6.2 g/dL (ref 6.0–8.3)

## 2020-12-14 LAB — IBC + FERRITIN
Ferritin: 52 ng/mL (ref 10.0–291.0)
Iron: 47 ug/dL (ref 42–145)
Saturation Ratios: 12.9 % — ABNORMAL LOW (ref 20.0–50.0)
Transferrin: 260 mg/dL (ref 212.0–360.0)

## 2020-12-14 LAB — HEMOGLOBIN A1C: Hgb A1c MFr Bld: 5.6 % (ref 4.6–6.5)

## 2020-12-14 LAB — URIC ACID: Uric Acid, Serum: 4.2 mg/dL (ref 2.4–7.0)

## 2020-12-14 LAB — TSH: TSH: 1.69 u[IU]/mL (ref 0.35–4.50)
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
## 2020-12-14 NOTE — Patient Instructions (Signed)
Will notify you  of labs when available.  Let us know if we need to do a referral  To physical therapy for  Balance and gait   PT.   FU   Depending  . On results.    I and  6- 12 months

## 2020-12-15 NOTE — Progress Notes (Signed)
Thyroid and blood sugar in good range ,iron levels slightly low, cholesterol at goal.  Kidney function creatinine somewhat decreased from last 6 months this may be spurious Arrange a nonfasting hydrated BMP in 3 months to ensure stability.

## 2020-12-17 ENCOUNTER — Encounter: Payer: PPO | Admitting: Gastroenterology

## 2020-12-17 NOTE — Addendum Note (Signed)
Addended by: Nilda Riggs on: 12/17/2020 10:46 AM   Modules accepted: Orders

## 2020-12-18 ENCOUNTER — Other Ambulatory Visit: Payer: Self-pay

## 2020-12-18 ENCOUNTER — Encounter (HOSPITAL_COMMUNITY): Payer: Self-pay | Admitting: Cardiology

## 2020-12-18 ENCOUNTER — Ambulatory Visit (HOSPITAL_COMMUNITY)
Admission: RE | Admit: 2020-12-18 | Discharge: 2020-12-18 | Disposition: A | Discharge status: Home / Self Care | Payer: PPO | Source: Ambulatory Visit | Attending: Cardiology | Admitting: Cardiology

## 2020-12-18 ENCOUNTER — Telehealth (HOSPITAL_COMMUNITY): Payer: Self-pay | Admitting: Pharmacy Technician

## 2020-12-18 ENCOUNTER — Encounter (HOSPITAL_COMMUNITY): Payer: Self-pay | Admitting: *Deleted

## 2020-12-18 ENCOUNTER — Other Ambulatory Visit (HOSPITAL_COMMUNITY): Payer: Self-pay

## 2020-12-18 VITALS — BP 110/64 | HR 57 | Wt 254.8 lb

## 2020-12-18 DIAGNOSIS — J9611 Chronic respiratory failure with hypoxia: Secondary | ICD-10-CM | POA: Diagnosis not present

## 2020-12-18 DIAGNOSIS — Z8249 Family history of ischemic heart disease and other diseases of the circulatory system: Secondary | ICD-10-CM | POA: Diagnosis not present

## 2020-12-18 DIAGNOSIS — Z6841 Body Mass Index (BMI) 40.0 and over, adult: Secondary | ICD-10-CM | POA: Insufficient documentation

## 2020-12-18 DIAGNOSIS — Z79899 Other long term (current) drug therapy: Secondary | ICD-10-CM | POA: Diagnosis not present

## 2020-12-18 DIAGNOSIS — I110 Hypertensive heart disease with heart failure: Secondary | ICD-10-CM | POA: Insufficient documentation

## 2020-12-18 DIAGNOSIS — I272 Pulmonary hypertension, unspecified: Secondary | ICD-10-CM

## 2020-12-18 DIAGNOSIS — I482 Chronic atrial fibrillation, unspecified: Secondary | ICD-10-CM | POA: Insufficient documentation

## 2020-12-18 DIAGNOSIS — G4733 Obstructive sleep apnea (adult) (pediatric): Secondary | ICD-10-CM | POA: Insufficient documentation

## 2020-12-18 DIAGNOSIS — I5032 Chronic diastolic (congestive) heart failure: Secondary | ICD-10-CM | POA: Diagnosis not present

## 2020-12-18 DIAGNOSIS — I350 Nonrheumatic aortic (valve) stenosis: Secondary | ICD-10-CM | POA: Diagnosis not present

## 2020-12-18 DIAGNOSIS — Z7901 Long term (current) use of anticoagulants: Secondary | ICD-10-CM | POA: Insufficient documentation

## 2020-12-18 DIAGNOSIS — J9612 Chronic respiratory failure with hypercapnia: Secondary | ICD-10-CM | POA: Insufficient documentation

## 2020-12-18 DIAGNOSIS — I2721 Secondary pulmonary arterial hypertension: Secondary | ICD-10-CM | POA: Diagnosis not present

## 2020-12-18 MED ORDER — FUROSEMIDE 20 MG PO TABS: 40.0000 mg | ORAL_TABLET | Freq: Two times a day (BID) | ORAL | 3 refills | Status: DC

## 2020-12-18 NOTE — Progress Notes (Signed)
Received referral from [name]  for this pt to participate in pulmonary rehab with the the diagnosis of Chronic Diastolic Heart Failure. Clinical review of pt follow up appt on 4/15 Heart failure clinic note. Also reviewed PCP office note. Pt with Covid Risk Score - 5. Pt appropriate for scheduling for Pulmonary rehab.  Will forward to support staff for scheduling and verification of insurance eligibility/benefits with pt consent. Cherre Huger, BSN Cardiac and Training and development officer

## 2020-12-18 NOTE — Telephone Encounter (Signed)
Patient Advocate Encounter  Patient was seen in clinic today and started on Jardiance. The current 30 day co-pay is $141.40, which is a barrier. Started an application for [employer].   Will fax in once signatures are obtained.

## 2020-12-18 NOTE — Patient Instructions (Addendum)
Increase Furosemide to 40 mg Twice daily   Start Jardiance 10 mg Daily, If/when approved for patient assistance  Lab work needed in 10-14 days, this can be done at your Primary Care MD office  Your physician recommends that you schedule a follow-up appointment in: 4-6 weeks  If you have any questions or concerns before your next appointment please send [place] a message through [place] or call our office at [phone].    TO LEAVE A MESSAGE FOR THE NURSE SELECT OPTION 2, PLEASE LEAVE A MESSAGE INCLUDING: . YOUR NAME . DATE OF BIRTH . CALL BACK NUMBER . REASON FOR CALL**this is important as we prioritize the call backs  [place] AS LONG AS YOU CALL BEFORE 4:00 PM  At the [facility], you and your health needs are our priority. As part of our continuing mission to provide you with exceptional heart care, we have created designated Provider Care Teams. These Care Teams include your primary Cardiologist (physician) and Advanced Practice Providers (APPs- Physician Assistants and Nurse Practitioners) who all work together to provide you with the care you need, when you need it.   You may see any of the following providers on your designated Care Team at your next follow up: Marland Kitchen Dr Glori Bickers . Dr Loralie Champagne . Dr Vickki Muff . Darrick Grinder, NP . Lyda Jester, [place] . Audry Riles, PharmD   Please be sure to bring in all your medications bottles to every appointment.

## 2020-12-20 NOTE — Progress Notes (Signed)
Date:  12/20/2020   ID:  [name], [name] Jan 04, 1951, MRN [id number]   Provider location: [facility] Type of Visit: Established patient   PCP:  [name], MD  Cardiologist:  [name]  History of Present Illness: [name] is a 70 y.o. female with PMH of pulmonary HTN, morbid obesity, chronic afib (on Xarelto, CVA in 2012, OHS/OSA (Had U PE3 surgery, and chronic respiratory failure with hypoxemia on continuous 02 at 2 lpm via [place]).  Admitted 3/2 ->05/07/71 with A/C diastolic CHF and A/C respiratory failure. Pt initially refused Bipap so venti mask used. Eventually tolerated transition to BiPAP. Overall pt diuresed from 285 lbs down to 229 lbs with 38 L of diuresis.  Echo 11/07/16 EF 55-60%, PASP 23m Hg, mildly dilated RV with moderate to severely decreased RV systolic function. RHC/LHC in 3/18 showed no coronary disease and confirmed severe PH.    Echo in 7/20 showed EF 60-65%, mild LVH, normal RV, PASP 38 mmHg, mild AS. Echo in 8/21 showed EF 60-65%, mildly decreased RV function, severe biatrial enlargement, small PFO, unable to estimate PA systolic pressure.   She returns today for followup of CHF.  She has been using Bipap nightly and 2 L oxygen with exertion.  Poor balance, uses a cane.  Mild dyspnea after walking about 100 feet.  Not very active.  Gets groceries on-line.  Does [facility].  No chest pain.  Chronic low back is a big limiter.  Weight up 4 lbs.  Continues on Xarelto, no BRBPR/melena.   Labs (1/19): K 3.7, creatinine 1.37 Labs (11/19): K 3.9, creatinine 1.2, LDL 67, hgb 13.2 Labs (1/20): K 3.5, creatinine 1.34 Labs (5/20): K 3.9, creatinine 1.18, LDL 73 Labs (6/21): K 4.7, creatinine 1.19, LDL 71, hgb 13.7 Labs (11/21): K 4, creatinine 1.17, LDL 71 Labs (4/22): K 4.3, creatinine 1.34, LDL 59  PMH 1. Chronic diastolic CHF with severe pulmonary hypertension and prominent RV failure:  Echo (3/18) with EF 55-60%, PASP  957mHg, mildly dilated RV with moderate to severely decreased RV systolic function. - RHC/LHC 11/07/16: No angiographic CAD; PA 90/55, LVEDP 23 (PCWP inaccurate), CI 2.15 Fick/2.32 Thermo, shunt run negative, PVR 5.7 WU.  - Echo (7/20): EF 60-65%, mild LVH, normal RV, PASP 38 mmHg, mild AS.  - Echo (8/21): EF 60-65%, mildly decreased RV function, severe biatrial enlargement, small PFO, unable to estimate PA systolic pressure. 2. Chronic hypercarbic/hypoxic respiratory failure with OHS/OSA. Sees Dr. McLake BellsUsing nightly BiPAP and oxygen by nasal cannula during the day.  3. Atrial fibrillation: Chronic - Rate controlled on Xarelto + atenolol  4. 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 PVR 5.7 WU on RHC 3/18. Suspect group 2 (elevated LA pressure) and group 3 (OHS/OSA) PH. However, cannot rule out group 1 component. She had V/Q scan that was not suggestive of chronic PE and high resolution chest CT that was not suggestive of ILD. ANA, RF, anti-SCL70 all negative.   5. H/o CVA 6. Hypothyroidism 7. H/o TKR 8. Aortic stenosis: Mild on 7/20 echo.   Current Outpatient Medications  Medication Sig Dispense Refill  . acetaZOLAMIDE (DIAMOX) 250 MG tablet Take 1 tablet (250 mg total) by mouth daily. 90 tablet 0  . ADCIRCA 20 MG tablet TAKE 2 TABLETS BY MOUTH DAILY. CALL [phone] REFILL 180 tablet 1  . allopurinol (ZYLOPRIM) 100 MG tablet TAKE 1 TABLET BY MOUTH TWICE A DAY 180 tablet  1  . atenolol (TENORMIN) 50 MG tablet Take 1 tablet (50 mg total) by mouth daily. Must be seen for further refills 90 tablet 0  . Cholecalciferol (VITAMIN D) 125 MCG (5000 UT) CAPS Take 5,000 Units by mouth 3 (three) times a week.     . Cyanocobalamin 5000 MCG TBDP Take 5,000 mcg by mouth 3 (three) times a week.     . diclofenac sodium (VOLTAREN) 1 % GEL Apply 2 g topically 2 (two) times daily as needed (for arthritis).    Marland Kitchen levothyroxine (SYNTHROID) 75 MCG tablet TAKE 1  TABLET BY MOUTH EVERY DAY 90 tablet 0  . potassium chloride (KLOR-CON) 10 MEQ tablet Take 2 tablets (20 mEq total) by mouth 2 (two) times daily. 360 tablet 1  . rosuvastatin (CRESTOR) 5 MG tablet TAKE 1 TABLET (5 MG TOTAL) BY MOUTH DAILY. MUST BE SEEN IN OFFICE FOR FURTHER REFILLS 90 tablet 0  . traMADol (ULTRAM) 50 MG tablet Take 1 tablet (50 mg total) by mouth 3 (three) times daily as needed. 60 tablet 1  . XARELTO 20 MG TABS tablet TAKE 1 TABLET (20 MG TOTAL) BY MOUTH DAILY WITH SUPPER. MUST BE SEEN FOR FURTHER REFILLS 90 tablet 0  . furosemide (LASIX) 20 MG tablet Take 2 tablets (40 mg total) by mouth 2 (two) times daily. 60 tablet 3   No current facility-administered medications for this encounter.    Allergies:   Ambien [zolpidem tartrate], Losartan, Prevacid [lansoprazole], Adhesive [tape], Keflex [cephalexin], Motrin [ibuprofen], Prilosec [omeprazole], Ace inhibitors, Benadryl [diphenhydramine hcl], and Spironolactone   Social History:  The patient  reports that she has never smoked. She has never used smokeless tobacco. She reports current alcohol use of about 1.0 standard drink of alcohol per week. She reports that she does not use drugs.   Family History:  The patient's family history includes COPD in her mother; Diabetes in her father; Heart attack in her father; Hypertension in her brother, father, mother, and sister; Liver cancer in her father; Osteoporosis in her mother; Stroke in her maternal grandmother.   ROS:  Please see the history of present illness.   All other systems are personally reviewed and negative.   Exam:   BP 110/64   Pulse (!) 57   Wt 115.6 kg (254 lb 12.8 oz)   LMP 05/06/2013   SpO2 97%   BMI 48.14 kg/m  General: NAD, obese Neck: JVP 8-9 cm, no thyromegaly or thyroid nodule.  Lungs: Clear to auscultation bilaterally with normal respiratory effort. CV: Nondisplaced PMI.  Heart irregular S1/S2, no S3/S4, no murmur.  Trace ankle edema.  No carotid bruit.   Normal pedal pulses.  Abdomen: Soft, nontender, no hepatosplenomegaly, no distention.  Skin: Intact without lesions or rashes.  Neurologic: Alert and oriented x 3.  Psych: Normal affect. Extremities: No clubbing or cyanosis.  HEENT: Normal.   Recent Labs: 12/14/2020: ALT 6; BUN 24; Creatinine, Ser 1.34; Hemoglobin 12.3; Platelets 129.0; Potassium 4.3; Sodium 145; TSH 1.69  Personally reviewed   Wt Readings from Last 3 Encounters:  12/18/20 115.6 kg (254 lb 12.8 oz)  12/14/20 116.5 kg (256 lb 12.8 oz)  11/10/20 113.4 kg (250 lb)    ASSESSMENT AND PLAN:  1. Chronic diastolic CHF:  Associated with severe pulmonary hypertension and prominent RV failure.  NYHA class III symptoms, unchanged.  Most recent echo in 8/21 showed showed LV EF 60-65%, mildly decreased RV function. On exam, she is volume overloaded.   - Increase Lasix to 40  mg bid. BMET in 10 days. .  - Continue diamox 250 mg daily.  - Add Jardiance 10 mg daily.  - I will arrange for pulmonary rehab.  2. Chronic hypercarbic/hypoxic respiratory failure with OHS/OSA:  Using bipap at night and oxygen during the day with exertion.    - Compliant with Bipap nightly.  - Continue regular exercise and efforts at weight loss. - Continue pulmonary followup.  3. Atrial fibrillation: Chronic, rate controlled  - Continue atenolol 50 mg daily.    - Continue Xarelto for anticoagulation.   4. 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 PVR 5.7 WU by [place] in 3/18. Suspect group 2 (elevated LA pressure) and group 3 (OHS/OSA) PH. However, cannot rule out group 1 component. She had V/Q scan that was not suggestive of chronic PE and high resolution chest CT that was not suggestive of ILD. ANA, RF, anti-SCL70 all negative.  She did not have a marked improvement from taking Adcirca => suspect predominantly group 2 and 3 PH.  Will hold off on additional pulmonary vasodilators. Echo in 8/21 with mildly decreased RV  function, unable to estimate PA systolic pressure.  - Continue Adcirca 40 mg daily.  - Continue BiPAP at night for OSA and oxygen with exertion.  5. HTN: BP controlled.    Followup 4-6 wks with APP   Signed, Loralie Champagne, MD  12/20/2020  [facility] [address] [facility] [place] [address] [phone] (office) [phone] (fax)

## 2020-12-23 ENCOUNTER — Other Ambulatory Visit (HOSPITAL_COMMUNITY): Payer: Self-pay | Admitting: Cardiology

## 2020-12-24 ENCOUNTER — Ambulatory Visit: Payer: PPO | Admitting: Gastroenterology

## 2020-12-24 ENCOUNTER — Encounter: Payer: Self-pay | Admitting: Gastroenterology

## 2020-12-24 ENCOUNTER — Telehealth (HOSPITAL_COMMUNITY): Payer: Self-pay | Admitting: Pharmacy Technician

## 2020-12-24 VITALS — BP 120/60 | HR 60

## 2020-12-24 DIAGNOSIS — K640 First degree hemorrhoids: Secondary | ICD-10-CM

## 2020-12-24 DIAGNOSIS — K648 Other hemorrhoids: Secondary | ICD-10-CM

## 2020-12-24 NOTE — Telephone Encounter (Signed)
Sent in [employer] application via fax.  Will follow up.

## 2020-12-24 NOTE — Patient Instructions (Signed)
HEMORRHOID BANDING PROCEDURE    FOLLOW-UP CARE   1. The procedure you have had should have been relatively painless since the banding of the area involved does not have nerve endings and there is no pain sensation.  The rubber band cuts off the blood supply to the hemorrhoid and the band may fall off as soon as 48 hours after the banding (the band may occasionally be seen in the toilet bowl following a bowel movement). You may notice a temporary feeling of fullness in the rectum which should respond adequately to plain Tylenol or Motrin.  2. Following the banding, avoid strenuous exercise that evening and resume full activity the next day.  A sitz bath (soaking in a warm tub) or bidet is soothing, and can be useful for cleansing the area after bowel movements.     3. To avoid constipation, take two tablespoons of natural wheat bran, natural oat bran, flax, Benefiber or any over the counter fiber supplement and increase your water intake to 7-8 glasses daily.    4. Unless you have been prescribed anorectal medication, do not put anything inside your rectum for two weeks: No suppositories, enemas, fingers, etc.  5. Occasionally, you may have more bleeding than usual after the banding procedure.  This is often from the untreated hemorrhoids rather than the treated one.  Don't be concerned if there is a tablespoon or so of blood.  If there is more blood than this, lie flat with your bottom higher than your head and apply an ice pack to the area. If the bleeding does not stop within a half an hour or if you feel faint, call our office at [phone] or go to the emergency room.  6. Problems are not common; however, if there is a substantial amount of bleeding, severe pain, chills, fever or difficulty passing urine (very rare) or other problems, you should call us at [phone] or report to the nearest emergency room.  7. Do not stay seated continuously for more than 2-3 hours for a day or two  after the procedure.  Tighten your buttock muscles 10-15 times every two hours and take 10-15 deep breaths every 1-2 hours.  Do not spend more than a few minutes on the toilet if you cannot empty your bowel; instead re-visit the toilet at a later time.

## 2020-12-24 NOTE — Progress Notes (Signed)
PROCEDURE NOTE: The patient presents with symptomatic grade I hemorrhoids, requesting rubber band ligation of their hemorrhoidal disease.  All risks, benefits and alternative forms of therapy were described and informed consent was obtained.   RA and LL were previously banded. She continues to have rectal bleeding with her first BM of the day. The amount of blood has slightly decreased. We discussed that additional bandings are required for persistent symptoms about 20% of the time. Long term anticoagulation is likely contributing to her bleeding.   She is maintained on Xarelto. Thedecision was made to remain on Xarelto for the banding to reduce the risk of potential cardiovascular events if Xarelto were held. She understands and accepts the increased risk of bleeding post banding with ongoing anticoagulationandsheconsents to proceed.   The anorectum was pre-medicated during digital exam with 0.125% NTG and 5% lidocaine.   The decision was made to band the RP internal hemorrhoid, and the [place] was used to perform band ligation without complication.  Digital anorectal examination was then performed to assure proper positioning of the band and to adjust the banded tissue as required. No adjustment was needed.  No complications were encountered and the patient tolerated the procedure well.  Dietary and behavioral recommendations were given and along with follow-up instructions.   Return for possible additional hemorrhoid banding in 2 - 3 weeks as required.  High fiber diet, Benefiber daily and at least 8 glasses of water daily. The patient was discharged home without pain or other post procedure problems.

## 2020-12-26 ENCOUNTER — Other Ambulatory Visit (HOSPITAL_COMMUNITY): Payer: Self-pay | Admitting: Cardiology

## 2020-12-28 ENCOUNTER — Telehealth (HOSPITAL_COMMUNITY): Payer: Self-pay | Admitting: Pharmacist

## 2020-12-28 MED ORDER — EMPAGLIFLOZIN 10 MG PO TABS: 10.0000 mg | ORAL_TABLET | Freq: Every day | ORAL | 11 refills | Status: DC

## 2020-12-28 NOTE — Telephone Encounter (Signed)
Sent in [place] prescription to [facility] so patient can use 14 day free card while we wait for patient assistance application to be processed. Unable to call patient back and inform her medication has been sent because her VM box is not set up.   Audry Riles, PharmD, BCPS, BCCP, CPP Heart Failure Clinic Pharmacist [phone]

## 2020-12-29 ENCOUNTER — Inpatient Hospital Stay: Admission: RE | Admit: 2020-12-29 | Payer: PPO | Source: Ambulatory Visit

## 2020-12-31 ENCOUNTER — Ambulatory Visit: Payer: PPO

## 2021-01-01 ENCOUNTER — Other Ambulatory Visit: Payer: Self-pay | Admitting: *Deleted

## 2021-01-01 ENCOUNTER — Other Ambulatory Visit: Payer: PPO

## 2021-01-01 DIAGNOSIS — E039 Hypothyroidism, unspecified: Secondary | ICD-10-CM

## 2021-01-01 DIAGNOSIS — R269 Unspecified abnormalities of gait and mobility: Secondary | ICD-10-CM

## 2021-01-01 DIAGNOSIS — Z79899 Other long term (current) drug therapy: Secondary | ICD-10-CM

## 2021-01-01 DIAGNOSIS — I10 Essential (primary) hypertension: Secondary | ICD-10-CM

## 2021-01-01 DIAGNOSIS — D649 Anemia, unspecified: Secondary | ICD-10-CM

## 2021-01-01 LAB — BASIC METABOLIC PANEL
BUN: 17 mg/dL (ref 6–23)
CO2: 25 mEq/L (ref 19–32)
Calcium: 9.4 mg/dL (ref 8.4–10.5)
Chloride: 108 mEq/L (ref 96–112)
Creatinine, Ser: 1.2 mg/dL (ref 0.40–1.20)
GFR: 45.59 mL/min — ABNORMAL LOW (ref >60.00)
Glucose, Bld: 97 mg/dL (ref 70–99)
Potassium: 4 mEq/L (ref 3.5–5.1)
Sodium: 142 mEq/L (ref 135–145)

## 2021-01-05 ENCOUNTER — Encounter (HOSPITAL_COMMUNITY): Payer: Self-pay | Admitting: *Deleted

## 2021-01-05 NOTE — Progress Notes (Signed)
Renal function slightly improved stable potassium in normal range

## 2021-01-07 ENCOUNTER — Telehealth (HOSPITAL_COMMUNITY): Payer: Self-pay | Admitting: *Deleted

## 2021-01-07 ENCOUNTER — Telehealth (HOSPITAL_COMMUNITY): Payer: Self-pay | Admitting: Pharmacy Technician

## 2021-01-07 MED ORDER — CIPROFLOXACIN HCL 250 MG PO TABS: 250.0000 mg | ORAL_TABLET | Freq: Two times a day (BID) | ORAL | 0 refills | Status: DC

## 2021-01-07 NOTE — Telephone Encounter (Signed)
Spoke with pt she is aware and agreeable with plan. Cipro sent to pharmacy.

## 2021-01-07 NOTE — Telephone Encounter (Addendum)
Advanced Heart Failure Patient Advocate Encounter   Patient was approved to receive Jardiance from [place]  Effective dates: 01/06/21 through 09/04/21  Called and spoke with the patient. She inquired into a bottle of samples, since she only has enough Jardiance to last until Thursday.   Tommas Olp, (RN) a request for a bottle of samples.  Advised the patient to call me with any issues.  Charlann Boxer, CPhT

## 2021-01-07 NOTE — Telephone Encounter (Signed)
Please have her get urinalysis so we can see if she has UTI.  Can empirically treat with ciprofloxacin, get dosing from HF pharmacist.  Would try to treat through this before stopping Jardiance.  If recurs, will stop.

## 2021-01-07 NOTE — Telephone Encounter (Signed)
Pt left vm stating jardiance is causing really bad back pain and uti like symptoms. Pt would like to know if she can get medication to treat uti and asked does she need to stop jardiance.  Routed to [place] for advice

## 2021-01-14 DIAGNOSIS — J9610 Chronic respiratory failure, unspecified whether with hypoxia or hypercapnia: Secondary | ICD-10-CM | POA: Diagnosis not present

## 2021-01-14 DIAGNOSIS — G4733 Obstructive sleep apnea (adult) (pediatric): Secondary | ICD-10-CM | POA: Diagnosis not present

## 2021-01-20 ENCOUNTER — Other Ambulatory Visit (HOSPITAL_COMMUNITY): Payer: Self-pay | Admitting: Cardiology

## 2021-01-20 ENCOUNTER — Telehealth (HOSPITAL_COMMUNITY): Payer: Self-pay

## 2021-01-20 NOTE — Progress Notes (Signed)
Date:  01/21/2021   ID:  Stacy, [name] 02-26-1951, MRN [id number]   Provider location: [facility] Type of Visit: Established patient   PCP:  [name], MD  Cardiologist:  [name]  History of Present Illness: [name] is a 70 y.o. 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  Admitted 3/2 ->6/31/49 with A/C diastolic CHF and A/C respiratory failure. Pt initially refused Bipap so venti mask used. Eventually tolerated transition to BiPAP. Overall pt diuresed from 285 lbs down to 229 lbs with 38 L of diuresis.  Echo 11/07/16 EF 55-60%, PASP 53m Hg, mildly dilated RV with moderate to severely decreased RV systolic function. RHC/LHC in 3/18 showed no coronary disease and confirmed severe PH.    Echo in 7/20 showed EF 60-65%, mild LVH, normal RV, PASP 38 mmHg, mild AS. Echo in 8/21 showed EF 60-65%, mildly decreased RV function, severe biatrial enlargement, small PFO, unable to estimate PA systolic pressure.   Today she returns for HF follow up. Last visit jardiance added and lasix was increased to 40 mg twice a day.Overall feeling fine. SOB with brisk walking. Denies PND/Orthopnea. Using Bipap at night.  Appetite ok. No fever or chills. Weight at home 255  pounds. Taking all medications. Says she usually only takes 20 mg lasix in the afternoon due to her activities. SBP at home 120s.   Labs (1/19): K 3.7, creatinine 1.37 Labs (11/19): K 3.9, creatinine 1.2, LDL 67, hgb 13.2 Labs (1/20): K 3.5, creatinine 1.34 Labs (5/20): K 3.9, creatinine 1.18, LDL 73 Labs (6/21): K 4.7, creatinine 1.19, LDL 71, hgb 13.7 Labs (11/21): K 4, creatinine 1.17, LDL 71 Labs (4/22): K 4.3, creatinine 1.34, LDL 59  PMH 1. Chronic diastolic CHF with severe pulmonary hypertension and prominent RV  failure:  Echo (3/18) with EF 55-60%, PASP 923mHg, mildly dilated RV with moderate to severely decreased RV systolic function. - RHC/LHC 11/07/16: No angiographic CAD; PA 90/55, LVEDP 23 (PCWP inaccurate), CI 2.15 Fick/2.32 Thermo, shunt run negative, PVR 5.7 WU.  - Echo (7/20): EF 60-65%, mild LVH, normal RV, PASP 38 mmHg, mild AS.  - Echo (8/21): EF 60-65%, mildly decreased RV function, severe biatrial enlargement, small PFO, unable to estimate PA systolic pressure. 2. Chronic hypercarbic/hypoxic respiratory failure with OHS/OSA. Sees Dr. McLake BellsUsing nightly BiPAP and oxygen by nasal cannula during the day.  3. Atrial fibrillation: Chronic - Rate controlled on Xarelto + atenolol  4. 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 PVR 5.7 WU on RHC 3/18. Suspect group 2 (elevated LA pressure) and group 3 (OHS/OSA) PH. However, cannot rule out group 1 component. She had V/Q scan that was not suggestive of chronic PE and high resolution chest CT that was not suggestive of ILD. ANA, RF, anti-SCL70 all negative.   5. H/o CVA 6. Hypothyroidism 7. H/o TKR 8. Aortic stenosis: Mild on 7/20 echo.   Current Outpatient Medications  Medication Sig Dispense Refill  . acetaZOLAMIDE (DIAMOX) 250 MG tablet TAKE 1 TABLET BY MOUTH EVERY DAY 90 tablet 0  . ADCIRCA 20 MG tablet TAKE 2 TABLETS BY MOUTH DAILY. CALL [phone] REFILL 180 tablet 1  . allopurinol (ZYLOPRIM) 100 MG tablet TAKE 1 TABLET BY MOUTH TWICE A DAY 180 tablet 1  . atenolol (  TENORMIN) 50 MG tablet TAKE 1 TABLET (50 MG TOTAL) BY MOUTH DAILY. MUST BE SEEN FOR FURTHER REFILLS 90 tablet 0  . Cholecalciferol (VITAMIN D) 125 MCG (5000 UT) CAPS Take 5,000 Units by mouth 3 (three) times a week.     . ciprofloxacin (CIPRO) 250 MG tablet Take 1 tablet (250 mg total) by mouth 2 (two) times daily. 6 tablet 0  . Cyanocobalamin 5000 MCG TBDP Take 5,000 mcg by mouth 3 (three) times a week.     . diclofenac sodium  (VOLTAREN) 1 % GEL Apply 2 g topically 2 (two) times daily as needed (for arthritis).    Marland Kitchen empagliflozin (JARDIANCE) 10 MG TABS tablet Take 1 tablet (10 mg total) by mouth daily. 30 tablet 11  . furosemide (LASIX) 20 MG tablet TAKE 2 TABLETS BY MOUTH 2 TIMES DAILY. 60 tablet 3  . levothyroxine (SYNTHROID) 75 MCG tablet TAKE 1 TABLET BY MOUTH EVERY DAY 90 tablet 0  . potassium chloride (KLOR-CON) 10 MEQ tablet Take 2 tablets (20 mEq total) by mouth 2 (two) times daily. 360 tablet 1  . rosuvastatin (CRESTOR) 5 MG tablet TAKE 1 TABLET (5 MG TOTAL) BY MOUTH DAILY. MUST BE SEEN IN OFFICE FOR FURTHER REFILLS 30 tablet 1  . traMADol (ULTRAM) 50 MG tablet Take 1 tablet (50 mg total) by mouth 3 (three) times daily as needed. 60 tablet 1  . XARELTO 20 MG TABS tablet TAKE 1 TABLET (20 MG TOTAL) BY MOUTH DAILY WITH SUPPER. MUST BE SEEN FOR FURTHER REFILLS 30 tablet 1   No current facility-administered medications for this encounter.    Allergies:   Ambien [zolpidem tartrate], Losartan, Prevacid [lansoprazole], Adhesive [tape], Keflex [cephalexin], Motrin [ibuprofen], Prilosec [omeprazole], Ace inhibitors, Benadryl [diphenhydramine hcl], and Spironolactone   Social History:  The patient  reports that she has never smoked. She has never used smokeless tobacco. She reports current alcohol use of about 1.0 standard drink of alcohol per week. She reports that she does not use drugs.   Family History:  The patient's family history includes COPD in her mother; Diabetes in her father; Heart attack in her father; Hypertension in her brother, father, mother, and sister; Liver cancer in her father; Osteoporosis in her mother; Stroke in her maternal grandmother.   ROS:  Please see the history of present illness.   All other systems are personally reviewed and negative.  Wt Readings from Last 3 Encounters:  01/21/21 116.5 kg (256 lb 12.8 oz)  12/18/20 115.6 kg (254 lb 12.8 oz)  12/14/20 116.5 kg (256 lb 12.8 oz)    Exam:   BP (!) 146/94   Pulse (!) 52   Wt 116.5 kg (256 lb 12.8 oz)   LMP 05/06/2013   SpO2 96%   BMI 48.52 kg/m  General:  Well appearing. No resp difficulty HEENT: normal Neck: supple. no JVD. Carotids 2+ bilat; no bruits. No lymphadenopathy or thryomegaly appreciated. Cor: PMI nondisplaced. Irregular rate & rhythm. No rubs, gallops or murmurs. Lungs: clear Abdomen: obese, soft, nontender, nondistended. No hepatosplenomegaly. No bruits or masses. Good bowel sounds. Extremities: no cyanosis, clubbing, rash, edema Neuro: alert & orientedx3, cranial nerves grossly intact. moves all 4 extremities w/o difficulty. Affect pleasant   Recent Labs: 12/14/2020: ALT 6; Hemoglobin 12.3; Platelets 129.0; TSH 1.69 01/01/2021: BUN 17; Creatinine, Ser 1.20; Potassium 4.0; Sodium 142  Personally reviewed   Wt Readings from Last 3 Encounters:  01/21/21 116.5 kg (256 lb 12.8 oz)  12/18/20 115.6 kg (254 lb 12.8 oz)  12/14/20 116.5 kg (256 lb 12.8 oz)    ASSESSMENT AND PLAN:  1. Chronic diastolic CHF:  Associated with severe pulmonary hypertension and prominent RV failure.  NYHA class III symptoms, unchanged.  Most recent echo in 8/21 showed showed LV EF 60-65%, mildly decreased RV function. NYHA II-III - Volume status stable. Continue  Lasix to 40 mg bid.  - Continue diamox 250 mg daily.  - Continue  Jardiance 10 mg daily.  -Referred to Pulmonary Rehab and currently on a waiting list.  2. Chronic hypercarbic/hypoxic respiratory failure with OHS/OSA:  Using bipap at night and oxygen during the day with exertion.    - Compliant with Bipap nightly.  -  Continue pulmonary followup.  3. Atrial fibrillation: Chronic, rate controlled.  - Continue atenolol 50 mg daily.    - Continue Xarelto for anticoagulation.   4. 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 PVR 5.7 WU by [place] in 3/18. Suspect group 2 (elevated LA pressure) and group 3 (OHS/OSA) PH. However,  cannot rule out group 1 component. She had V/Q scan that was not suggestive of chronic PE and high resolution chest CT that was not suggestive of ILD. ANA, RF, anti-SCL70 all negative.  She did not have a marked improvement from taking Adcirca => suspect predominantly group 2 and 3 PH.  Will hold off on additional pulmonary vasodilators. Echo in 8/21 with mildly decreased RV function, unable to estimate PA systolic pressure.  - Continue Adcirca 40 mg daily.  - Continue BiPAP at night for OSA and oxygen with exertion.  5. HTN: Elevated here but at home stable. Continue current regimen.     Follow up with Dr Aundra Dubin in 4 months.   Stacy Hubert, NP  01/21/2021  [facility] [address] [facility] [place] [address] [phone] (office) [phone] (fax)

## 2021-01-20 NOTE — Telephone Encounter (Signed)
Pt insurance is active and benefits verified through [employer]. Co-pay $15.00, DED $0.00/$0.00 met, out of pocket $3,450.00/$182.57 met, co-insurance 0%. No pre-authorization required. Adam/[facility]A, 01/20/21 @ 3:03PM, [facility]#[id number]  Will contact patient to see if she is interested in the Pulmonary Rehab Program.

## 2021-01-20 NOTE — Telephone Encounter (Signed)
Called patient to see if she is interested in the Pulmonary Rehab Program. Patient expressed interest. Explained scheduling process and went over insurance, patient verbalized understanding. Will contact patient for scheduling at a later date. (1-3 months)

## 2021-01-21 ENCOUNTER — Ambulatory Visit (HOSPITAL_COMMUNITY)
Admission: RE | Admit: 2021-01-21 | Discharge: 2021-01-21 | Disposition: A | Discharge status: Home / Self Care | Payer: PPO | Source: Ambulatory Visit | Attending: Adult Health | Admitting: Adult Health

## 2021-01-21 ENCOUNTER — Other Ambulatory Visit: Payer: Self-pay

## 2021-01-21 ENCOUNTER — Encounter (HOSPITAL_COMMUNITY): Payer: Self-pay

## 2021-01-21 VITALS — BP 146/94 | HR 52 | Wt 256.8 lb

## 2021-01-21 DIAGNOSIS — Z886 Allergy status to analgesic agent status: Secondary | ICD-10-CM | POA: Diagnosis not present

## 2021-01-21 DIAGNOSIS — J9612 Chronic respiratory failure with hypercapnia: Secondary | ICD-10-CM | POA: Diagnosis not present

## 2021-01-21 DIAGNOSIS — Z9981 Dependence on supplemental oxygen: Secondary | ICD-10-CM | POA: Diagnosis not present

## 2021-01-21 DIAGNOSIS — Z7901 Long term (current) use of anticoagulants: Secondary | ICD-10-CM | POA: Diagnosis not present

## 2021-01-21 DIAGNOSIS — G4733 Obstructive sleep apnea (adult) (pediatric): Secondary | ICD-10-CM | POA: Insufficient documentation

## 2021-01-21 DIAGNOSIS — Z79890 Hormone replacement therapy (postmenopausal): Secondary | ICD-10-CM | POA: Diagnosis not present

## 2021-01-21 DIAGNOSIS — I272 Pulmonary hypertension, unspecified: Secondary | ICD-10-CM

## 2021-01-21 DIAGNOSIS — Z888 Allergy status to other drugs, medicaments and biological substances status: Secondary | ICD-10-CM | POA: Insufficient documentation

## 2021-01-21 DIAGNOSIS — I110 Hypertensive heart disease with heart failure: Secondary | ICD-10-CM | POA: Diagnosis not present

## 2021-01-21 DIAGNOSIS — Z79899 Other long term (current) drug therapy: Secondary | ICD-10-CM | POA: Diagnosis not present

## 2021-01-21 DIAGNOSIS — J9611 Chronic respiratory failure with hypoxia: Secondary | ICD-10-CM | POA: Diagnosis not present

## 2021-01-21 DIAGNOSIS — Z881 Allergy status to other antibiotic agents status: Secondary | ICD-10-CM | POA: Diagnosis not present

## 2021-01-21 DIAGNOSIS — I482 Chronic atrial fibrillation, unspecified: Secondary | ICD-10-CM

## 2021-01-21 DIAGNOSIS — I2721 Secondary pulmonary arterial hypertension: Secondary | ICD-10-CM | POA: Diagnosis not present

## 2021-01-21 DIAGNOSIS — I5032 Chronic diastolic (congestive) heart failure: Secondary | ICD-10-CM | POA: Insufficient documentation

## 2021-01-21 DIAGNOSIS — Z7984 Long term (current) use of oral hypoglycemic drugs: Secondary | ICD-10-CM | POA: Insufficient documentation

## 2021-01-21 NOTE — Patient Instructions (Addendum)
Follow-up with Dr. Scarlette Shorts see appt

## 2021-01-25 ENCOUNTER — Encounter: Payer: PPO | Admitting: Gastroenterology

## 2021-01-28 ENCOUNTER — Telehealth: Payer: Self-pay | Admitting: Pharmacist

## 2021-01-28 NOTE — Chronic Care Management (AMB) (Signed)
Chronic Care Management Pharmacy Assistant   Name: [name]  MRN: [id number] DOB: Apr 07, 1951  Reason for Encounter: Disease State/ Hypertension Assessment Call.    Conditions to be addressed/monitored: HTN  Recent office visits:  12/14/20 [name] MD (PCP) - seen for hypothyroidism and other chronic conditions. No medication changes. Follow up in 6-12 months depending on results of labs.   Recent consult visits:  12/18/20 Loralie Champagne MD ( Cardiology) - seen for congestive heart failure and other chronic conditions. Increased furosemide to 13m twice daily. Start jardiance 124mdaily once or if approved with PAP. Follow up in 4-6 weeks.   Hospital visits:  None in previous 6 months  Medications: Outpatient Encounter Medications as of 01/28/2021  Medication Sig  . acetaZOLAMIDE (DIAMOX) 250 MG tablet TAKE 1 TABLET BY MOUTH EVERY DAY  . ADCIRCA 20 MG tablet TAKE 2 TABLETS BY MOUTH DAILY. CALL [phone] REFILL  . allopurinol (ZYLOPRIM) 100 MG tablet TAKE 1 TABLET BY MOUTH TWICE A DAY  . atenolol (TENORMIN) 50 MG tablet TAKE 1 TABLET (50 MG TOTAL) BY MOUTH DAILY. MUST BE SEEN FOR FURTHER REFILLS  . Cholecalciferol (VITAMIN D) 125 MCG (5000 UT) CAPS Take 5,000 Units by mouth 3 (three) times a week.   . ciprofloxacin (CIPRO) 250 MG tablet Take 1 tablet (250 mg total) by mouth 2 (two) times daily.  . Cyanocobalamin 5000 MCG TBDP Take 5,000 mcg by mouth 3 (three) times a week.   . diclofenac sodium (VOLTAREN) 1 % GEL Apply 2 g topically 2 (two) times daily as needed (for arthritis).  . Marland Kitchenmpagliflozin (JARDIANCE) 10 MG TABS tablet Take 1 tablet (10 mg total) by mouth daily.  . furosemide (LASIX) 20 MG tablet TAKE 2 TABLETS BY MOUTH 2 TIMES DAILY.  . Marland Kitchenevothyroxine (SYNTHROID) 75 MCG tablet TAKE 1 TABLET BY MOUTH EVERY DAY  . potassium chloride (KLOR-CON) 10 MEQ tablet Take 2 tablets (20 mEq total) by mouth 2 (two) times daily.  . rosuvastatin (CRESTOR) 5 MG tablet  TAKE 1 TABLET (5 MG TOTAL) BY MOUTH DAILY. MUST BE SEEN IN OFFICE FOR FURTHER REFILLS  . traMADol (ULTRAM) 50 MG tablet Take 1 tablet (50 mg total) by mouth 3 (three) times daily as needed.  . Alveda Reasons0 MG TABS tablet TAKE 1 TABLET (20 MG TOTAL) BY MOUTH DAILY WITH SUPPER. MUST BE SEEN FOR FURTHER REFILLS  . [DISCONTINUED] TOPAMAX 50 MG tablet Take 50 mg by mouth.   No facility-administered encounter medications on file as of 01/28/2021.   Reviewed chart prior to disease state call. Spoke with patient regarding BP  Recent Office Vitals: BP Readings from Last 3 Encounters:  01/21/21 (!) 146/94  12/24/20 120/60  12/18/20 110/64   Pulse Readings from Last 3 Encounters:  01/21/21 (!) 52  12/24/20 60  12/18/20 (!) 57    Wt Readings from Last 3 Encounters:  01/21/21 256 lb 12.8 oz (116.5 kg)  12/18/20 254 lb 12.8 oz (115.6 kg)  12/14/20 256 lb 12.8 oz (116.5 kg)     Kidney Function Lab Results  Component Value Date/Time   CREATININE 1.20 01/01/2021 10:32 AM   CREATININE 1.34 (H) 12/14/2020 10:32 AM   CREATININE 1.01 (H) 07/05/2016 10:13 AM   CREATININE 0.90 09/29/2015 02:37 PM   GFR 45.59 (L) 01/01/2021 10:32 AM   GFRNONAA 50 (L) 07/14/2020 03:32 AM   GFRNONAA 58 (L) 07/05/2016 10:13 AM   GFRAA 47 (L) 09/19/2018 09:30 AM   GFRAA 67 07/05/2016 10:13 AM  BMP Latest Ref Rng & Units 01/01/2021 12/14/2020 07/14/2020  Glucose 70 - 99 mg/dL 97 103(H) 140(H)  BUN 6 - 23 mg/dL 17 24(H) 21  Creatinine 0.40 - 1.20 mg/dL 1.20 1.34(H) 1.17(H)  Sodium 135 - 145 mEq/L 142 145 139  Potassium 3.5 - 5.1 mEq/L 4.0 4.3 4.0  Chloride 96 - 112 mEq/L 108 111 108  CO2 19 - 32 mEq/L _0 Calcium 8.4 - 10.5 mg/dL 9.4 9.0 9.3    . Current antihypertensive regimen:   atenolol 49m, 1 tablet daily  . How often are you checking your Blood Pressure? infrequently  . Current home BP readings: patient states her blood pressure usually runs in the 110-120/ 60-70.   .Marland KitchenWhat recent  interventions/DTPs have been made by any provider to improve Blood Pressure control since last CPP Visit: None.   . Any recent hospitalizations or ED visits since last visit with CPP? No  . What diet changes have been made to improve Blood Pressure Control?  o Patient states she does not really add salt to her food but she will be more now that tomato season is here and she loves to eat a tomato and mayonnaise sandwich with salt and pepper while they are in season. Patient states she does not eat a lot of red meat or pork but she will have a BBQ sandwich here and there. patient states she eats a lot of vegetables and she does go to lunch with friends throughout the week.  . What exercise is being done to improve your Blood Pressure Control?  o Patient states she is not able to do a lot of steady walking and she uses a cane due to arthritis in her back. Patient states she is able to do things around home for herself and she dies chair exercises and stretches daily. Patient states she goes out to town whenever she feels like it ad has no trouble getting out of the house.   Adherence Review: Is the patient currently on ACE/ARB medication? No Does the patient have >5 day gap between last estimated fill dates? No  Notes: Spoke with patient and went over medications as listed. Patient reports taking all medications and no issues at this time. Patient stated she was approved for the PAP for the Jardiance and is now taking that daily as prescribed. Patient states that she does not take the 4 tablets of the furosemide that Dr. MAundra Dubinincreased she tends to only take 3 a day because she would never get off the toilet. Patient  Stated she is on the waiting list to start a pulmonary [facility] program at [facility] but the list is 3 months long. Patient stated she checks her blood pressure infrequently and has not write numbers down. I re encouraged patient to check blood pressure at least weekly if not more and write  the numbers down. Patient is agreeable and verbalized understanding. Patient thanked me for my call. [facility] I advised patient to keep an eye on any increased swelling or increased shortness of breath due to only taking 3 of her furosemide after increase by Dr. MAundra Dubinand for patient to call his office and let them know. Patient verbalized understanding on this as well,   Star Rating Drugs:   Rosuvastatin 524m- [facility]20 [facility] ([phone]

## 2021-01-29 ENCOUNTER — Encounter: Payer: PPO | Admitting: Gastroenterology

## 2021-02-02 ENCOUNTER — Other Ambulatory Visit (HOSPITAL_COMMUNITY): Payer: Self-pay | Admitting: Cardiology

## 2021-02-02 NOTE — Telephone Encounter (Cosign Needed)
2nd attempt. Lvm with Vivien.

## 2021-02-03 ENCOUNTER — Other Ambulatory Visit: Payer: Self-pay | Admitting: Internal Medicine

## 2021-02-03 DIAGNOSIS — J9610 Chronic respiratory failure, unspecified whether with hypoxia or hypercapnia: Secondary | ICD-10-CM | POA: Diagnosis not present

## 2021-02-03 DIAGNOSIS — G4733 Obstructive sleep apnea (adult) (pediatric): Secondary | ICD-10-CM | POA: Diagnosis not present

## 2021-02-05 ENCOUNTER — Telehealth (HOSPITAL_COMMUNITY): Payer: Self-pay | Admitting: Pharmacy Technician

## 2021-02-05 NOTE — Telephone Encounter (Addendum)
Advanced Heart Failure Patient Advocate Encounter  Sent in [employer] application via fax.  Of note, the patient has not met the 4% OOP requirement for approval.

## 2021-02-16 ENCOUNTER — Ambulatory Visit: Payer: PPO

## 2021-02-19 ENCOUNTER — Telehealth (HOSPITAL_COMMUNITY): Payer: Self-pay

## 2021-02-19 NOTE — Telephone Encounter (Signed)
Called patient to see if she was interested in participating in the Pulmonary Rehab Program. Patient stated yes. Patient will come in for orientation on 03/23/21 @ 1:15PM and will attend the 9AM exercise class.   Tourist information centre manager.

## 2021-02-22 ENCOUNTER — Other Ambulatory Visit (HOSPITAL_COMMUNITY): Payer: Self-pay | Admitting: Unknown Physician Specialty

## 2021-02-26 ENCOUNTER — Other Ambulatory Visit (HOSPITAL_COMMUNITY): Payer: Self-pay | Admitting: Cardiology

## 2021-03-01 DIAGNOSIS — G4733 Obstructive sleep apnea (adult) (pediatric): Secondary | ICD-10-CM | POA: Diagnosis not present

## 2021-03-01 DIAGNOSIS — J9610 Chronic respiratory failure, unspecified whether with hypoxia or hypercapnia: Secondary | ICD-10-CM | POA: Diagnosis not present

## 2021-03-03 ENCOUNTER — Other Ambulatory Visit (HOSPITAL_COMMUNITY): Payer: Self-pay | Admitting: Cardiology

## 2021-03-11 ENCOUNTER — Telehealth (HOSPITAL_COMMUNITY): Payer: Self-pay | Admitting: *Deleted

## 2021-03-15 ENCOUNTER — Other Ambulatory Visit: Payer: Self-pay

## 2021-03-15 ENCOUNTER — Encounter (HOSPITAL_COMMUNITY)
Admission: RE | Admit: 2021-03-15 | Discharge: 2021-03-15 | Disposition: A | Discharge status: Home / Self Care | Payer: PPO | Source: Ambulatory Visit | Attending: Cardiology | Admitting: Cardiology

## 2021-03-15 VITALS — BP 138/78 | HR 58 | Ht 60.5 in | Wt 256.6 lb

## 2021-03-15 DIAGNOSIS — I5032 Chronic diastolic (congestive) heart failure: Secondary | ICD-10-CM | POA: Insufficient documentation

## 2021-03-15 NOTE — Progress Notes (Signed)
[name] 70 y.o. female Pulmonary Rehab Orientation Note This patient who was referred to Pulmonary rehab by Dr. Loralie Champagne with the diagnosis of chronic diastolic heart failure arrived today in Cardiac and Pulmonary Rehab. She arrived with an assistive device of a rolling walker with unsteady gait. She has not fallen in the last year, but alternates between a cane and a rolling walker due to unsteady gait and severe back arthritis. She does not carry portable oxygen. Per pt,she uses oxygen never. Color good, skin warm and dry. Patient is oriented to time and place. Patient's medical history, psychosocial health, and medications reviewed. Psychosocial assessment reveals pt lives alone. Pt is currently retired. Pt hobbies include reading and her church bible study group. Pt reports her stress level is low. She has no areas of stress reported.  Pt does not exhibit signs of depression. She does report feeling tired , but attributes that to her chronic diastolic heart failure and not depression. PHQ2/9 score 0/3. Pt shows good  coping skills with positive outlook . Will continue to monitor and evaluate progress toward psychosocial goal(s) of continued psychosocial wellbeing while participating in pulmonary rehab. Physical assessment reveals heart rate is  irregular, she has a history of atrial fib , breath sounds clear to auscultation, no wheezes, rales, or rhonchi. Grip strength equal, strong. Patient reports she does take medications as prescribed. Patient states she follows a Low Sodium diet. The patient reports no specific efforts to gain or lose weight.. Patient's weight will be monitored closely. Demonstration and practice of PLB using pulse oximeter. Patient able to return demonstration satisfactorily. Safety and hand hygiene in the exercise area reviewed with patient. Patient voices understanding of the information reviewed. Department expectations discussed with patient and achievable goals  were set. The patient shows enthusiasm about attending the program and we look forward to working with this nice lady. The patient completed a 6 min walk test on today, 03/15/2021 and to begin exercise on Tues, 03/23/2021 in the 1:15 pm class.  [id number]

## 2021-03-16 NOTE — Progress Notes (Signed)
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 fruit and vegetable intake. Reports medication compliance.;Short term: Daily weights obtained and reported for increase. Utilizing diuretic protocols set by physician.;Long term: Adoption of self-care skills and reduction of barriers for early signs and symptoms recognition and intervention  leading to self-care maintenance.             Core Components/Risk Factors/Patient Goals Review:    Core Components/Risk Factors/Patient Goals at Discharge (Final Review):    ITP Comments:   Comments:

## 2021-03-23 ENCOUNTER — Encounter (HOSPITAL_COMMUNITY)
Admission: RE | Admit: 2021-03-23 | Discharge: 2021-03-23 | Disposition: A | Discharge status: Home / Self Care | Payer: PPO | Source: Ambulatory Visit | Attending: Cardiology | Admitting: Cardiology

## 2021-03-23 ENCOUNTER — Other Ambulatory Visit: Payer: Self-pay

## 2021-03-23 DIAGNOSIS — I5032 Chronic diastolic (congestive) heart failure: Secondary | ICD-10-CM

## 2021-03-23 NOTE — Progress Notes (Signed)
Daily Session Note  Patient Details  Name: [name] MRN: [id number] Date of Birth: 20-Aug-1951 Referring Provider:   [name] Pulmonary Rehab Walk Test from 03/15/2021 in [place]  Referring Provider Todd Mission       Encounter Date: 03/23/2021  Check In:  Session Check In - 03/23/21 1502       Check-In   Supervising physician immediately available to respond to emergencies [facility] immediately available    Physician(s) Dr. Sabino Gasser    Location [facility]    Staff Present Rosebud Poles, RN, Isaac Laud, MS, ACSM-CEP, Exercise Physiologist;Lisa Ysidro Evert, RN    Virtual Visit No    Medication changes reported     No    Fall or balance concerns reported    No    Tobacco Cessation No Change    Warm-up and Cool-down Performed as group-led instruction    Resistance Training Performed Yes    VAD Patient? No    PAD/SET Patient? No      Pain Assessment   Currently in Pain? No/denies    Multiple Pain Sites No             Capillary Blood Glucose: No results found for this or any previous visit (from the past 24 hour(s)).    Social History   Tobacco Use  Smoking Status Never  Smokeless Tobacco Never    Goals Met:  Proper associated with RPD/PD & O2 Sat Exercise tolerated well No report of cardiac concerns or symptoms Strength training completed today  Goals Unmet:  Not Applicable  Comments: Service time is from 1315 to 1435 Patient completed first day of exercise and tolerated well with no concerns.     Dr. Fransico Him is Medical Director for Cardiac Rehab at [facility].

## 2021-03-23 NOTE — Progress Notes (Signed)
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 goals/outcomes       Oxygen Equipment:  -Group instruction provided by [employer], Network engineer, and Insurance underwriter. Flowsheet Row PULMONARY REHAB OTHER RESPIRATORY from 09/14/2017 in [place]  Date 08/03/17  Educator Ace Gins  Instruction Review Code (Retired) 2- meets goals/outcomes       Signs and Symptoms:  -Group instruction provided by written material and verbal discussion to support  subject matter. Warning signs and symptoms of infection, stroke, and heart attack are reviewed and when to call the physician/911 reinforced. Tips for preventing the spread of  infection discussed. Flowsheet Row PULMONARY REHAB OTHER RESPIRATORY from 09/14/2017 in [place]  Date 05/25/17  Educator RN  Instruction Review Code (Retired) 2- meets goals/outcomes       Advanced Directives:  -Group instruction provided by verbal instruction and written material to support subject matter. Instructor reviews Advanced Directive laws and proper instruction for filling out document.   Pulmonary Video:  -Group video education that reviews the importance of medication and oxygen compliance, exercise, good nutrition, pulmonary hygiene, and pursed lip and diaphragmatic breathing for the pulmonary patient. Flowsheet Row PULMONARY REHAB OTHER RESPIRATORY from 09/14/2017 in [place]  Date 06/01/17  Instruction Review Code (Retired) 2- meets goals/outcomes       Exercise for the Pulmonary Patient:  -Group instruction that is supported by a PowerPoint presentation. Instructor discusses benefits of exercise, core components of exercise, frequency, duration, and intensity of an exercise routine, importance of utilizing pulse oximetry during exercise, safety while exercising, and options of places to exercise outside of rehab.   Flowsheet Row PULMONARY REHAB OTHER RESPIRATORY from 09/14/2017 in [place]  Date 07/20/17  Educator Cloyde Reams  Instruction Review Code (Retired) 2- meets goals/outcomes       Pulmonary Medications:  -Engineer, maintenance (IT) group education provided by Art therapist with focus on inhaled medications and proper administration. Flowsheet Row PULMONARY REHAB OTHER RESPIRATORY from 09/14/2017 in [place]  Date 07/06/17  Educator Pharmacist  Instruction Review Code (Retired) R- Curator and Physiology of the Respiratory System and Intimacy:  -Group instruction provided by PowerPoint, verbal discussion, and  written material to support subject matter. Instructor reviews respiratory cycle and anatomical components of the respiratory system and their functions. Instructor also reviews differences in obstructive and restrictive respiratory diseases with examples of each. Intimacy, Sex, and Sexuality differences are reviewed with a discussion on how relationships can change when diagnosed with pulmonary disease. Common sexual concerns are reviewed. Flowsheet Row PULMONARY REHAB OTHER RESPIRATORY from 09/14/2017 in [place]  Date 08/24/17  Educator RN  Instruction Review Code (Retired) 2- meets goals/outcomes       MD DAY -A group question and answer session with a medical doctor that allows participants to ask questions that relate to their pulmonary disease state. Flowsheet Row PULMONARY REHAB OTHER RESPIRATORY from 09/14/2017 in [place]  Date 09/14/17  Educator Nelda Marseille  Instruction Review Code (Retired) R- Review/reinforce       OTHER EDUCATION -Group or individual verbal, written, or video instructions that support the educational goals of the pulmonary rehab program. [place] from 09/14/2017 in [place]  Date 07/13/17  Educator RD  Instruction Review Code 1- Verbalizes Understanding       Holiday Eating Survival Tips:  -Group instruction provided by PowerPoint slides, verbal discussion, and written materials to support subject matter. The instructor gives patients tips, tricks, and techniques to help them not only survive but enjoy the holidays despite the onslaught of food that accompanies the holidays.   Knowledge Questionnaire Score:  Knowledge Questionnaire Score - 03/15/21 1036       Knowledge Questionnaire Score   Pre Score 15/18  Core Components/Risk Factors/Patient Goals at Admission:  Personal Goals and Risk Factors at  Admission - 03/15/21 0938       Core Components/Risk Factors/Patient Goals on Admission   Heart Failure Yes    Intervention Provide a combined exercise and nutrition program that is supplemented with education, support and counseling about heart failure. Directed toward relieving symptoms such as shortness of breath, decreased exercise tolerance, and extremity edema.    Expected Outcomes Improve functional capacity of life;Short term: Attendance in program 2-3 days a week with increased exercise capacity. Reported lower sodium intake. Reported increased fruit and vegetable intake. Reports medication compliance.;Short term: Daily weights obtained and reported for increase. Utilizing diuretic protocols set by physician.;Long term: Adoption of self-care skills and reduction of barriers for early signs and symptoms recognition and intervention leading to self-care maintenance.             Core Components/Risk Factors/Patient Goals Review:   Goals and Risk Factor Review     Row Name 03/22/21 1409             Core Components/Risk Factors/Patient Goals Review   Personal Goals Review Heart Failure       Review [name] will begin exercising in pulmonary rehab 03/23/2021.       Expected Outcomes See admission goals.                Core Components/Risk Factors/Patient Goals at Discharge (Final Review):   Goals and Risk Factor Review - 03/22/21 1409       Core Components/Risk Factors/Patient Goals Review   Personal Goals Review Heart Failure    Review [name] will begin exercising in pulmonary rehab 03/23/2021.    Expected Outcomes See admission goals.             ITP Comments:   Comments:  [name] is scheduled to start Pulmonary rehab this week. Pulmonary rehab staff will continue to monitor and reassess progress toward goals during her participation in Pulmonary Rehab.

## 2021-03-25 ENCOUNTER — Encounter (HOSPITAL_COMMUNITY)
Admission: RE | Admit: 2021-03-25 | Discharge: 2021-03-25 | Disposition: A | Discharge status: Home / Self Care | Payer: PPO | Source: Ambulatory Visit | Attending: Cardiology | Admitting: Cardiology

## 2021-03-25 ENCOUNTER — Other Ambulatory Visit: Payer: Self-pay

## 2021-03-25 DIAGNOSIS — I5032 Chronic diastolic (congestive) heart failure: Secondary | ICD-10-CM | POA: Diagnosis not present

## 2021-03-25 NOTE — Progress Notes (Signed)
Daily Session Note  Patient Details  Name: [name] MRN: [id number] Date of Birth: 09-Jan-1951 Referring Provider:   [name] Pulmonary Rehab Walk Test from 03/15/2021 in [place]  Referring Provider [place]       Encounter Date: 03/25/2021  Check In:  Session Check In - 03/25/21 1409       Check-In   Supervising physician immediately available to respond to emergencies [facility] immediately available    Physician(s) Dr. Sabino Gasser    [facility]    Staff Present Rosebud Poles, RN, BSN;Lisa Ysidro Evert, Cathleen Fears, MS, ACSM-CEP, Exercise Physiologist;Jessica Hassell Done, MS, ACSM-CEP, Exercise Physiologist    Virtual Visit No    Medication changes reported     No    Fall or balance concerns reported    No    Tobacco Cessation No Change    Warm-up and Cool-down Performed as group-led instruction    Resistance Training Performed Yes    VAD Patient? No    PAD/SET Patient? No      Pain Assessment   Currently in Pain? No/denies    Multiple Pain Sites No             Capillary Blood Glucose: No results found for this or any previous visit (from the past 24 hour(s)).    Social History   Tobacco Use  Smoking Status Never  Smokeless Tobacco Never    Goals Met:  Proper associated with RPD/PD & O2 Sat Exercise tolerated well No report of cardiac concerns or symptoms Strength training completed today  Goals Unmet:  Not Applicable  Comments: Service time is from 1315 to 1430    Dr. Fransico Him is Medical Director for Cardiac Rehab at [facility].

## 2021-03-30 ENCOUNTER — Other Ambulatory Visit (HOSPITAL_COMMUNITY): Payer: Self-pay | Admitting: Cardiology

## 2021-03-30 ENCOUNTER — Encounter (HOSPITAL_COMMUNITY)
Admission: RE | Admit: 2021-03-30 | Discharge: 2021-03-30 | Disposition: A | Discharge status: Home / Self Care | Payer: PPO | Source: Ambulatory Visit | Attending: Cardiology | Admitting: Cardiology

## 2021-03-30 ENCOUNTER — Other Ambulatory Visit: Payer: Self-pay

## 2021-03-30 VITALS — Wt 256.0 lb

## 2021-03-30 DIAGNOSIS — I5032 Chronic diastolic (congestive) heart failure: Secondary | ICD-10-CM | POA: Diagnosis not present
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
## 2021-03-30 NOTE — Progress Notes (Signed)
Daily Session Note  Patient Details  Name: [name] MRN: [id number] Date of Birth: 11/21/1950 Referring Provider:   [name] Pulmonary Rehab Walk Test from 03/15/2021 in [place]  Referring Provider [place]       Encounter Date: 03/30/2021  Check In:  Session Check In - 03/30/21 1434       Check-In   Supervising physician immediately available to respond to emergencies [facility] immediately available    Physician(s) Dr. Virgilio Belling    [facility]    Staff Present Rosebud Poles, RN, BSN;Lisa Ysidro Evert, Cathleen Fears, MS, ACSM-CEP, Exercise Physiologist;Jessica Hassell Done, MS, ACSM-CEP, Exercise Physiologist    Virtual Visit No    Medication changes reported     No    Fall or balance concerns reported    No    Tobacco Cessation No Change    Warm-up and Cool-down Performed as group-led instruction    Resistance Training Performed Yes    VAD Patient? No    PAD/SET Patient? No      Pain Assessment   Currently in Pain? No/denies    Multiple Pain Sites No             Capillary Blood Glucose: No results found for this or any previous visit (from the past 24 hour(s)).   Exercise Prescription Changes - 03/30/21 1400       Response to Exercise   Blood Pressure (Admit) 104/60    Blood Pressure (Exercise) 122/66    Blood Pressure (Exit) 100/62    Heart Rate (Admit) 61 bpm    Heart Rate (Exercise) 76 bpm    Heart Rate (Exit) 71 bpm    Oxygen Saturation (Admit) 98 %    Oxygen Saturation (Exercise) 95 %    Oxygen Saturation (Exit) 93 %    Rating of Perceived Exertion (Exercise) 11    Perceived Dyspnea (Exercise) 0    Duration Progress to 30 minutes of  aerobic without signs/symptoms of physical distress      Progression   Progression Continue to progress workloads to maintain intensity without signs/symptoms of physical distress.      Resistance Training   Training Prescription Yes    Weight  Red bands    Reps 10-15    Time 10 Minutes      NuStep   Level 1    SPM 70    Minutes 30             Social History   Tobacco Use  Smoking Status Never  Smokeless Tobacco Never    Goals Met:  Exercise tolerated well No report of cardiac concerns or symptoms Strength training completed today  Goals Unmet:  Not Applicable  Comments: Service time is from 1315 to 1430    Dr. Fransico Him is Medical Director for Cardiac Rehab at [facility].

## 2021-03-31 ENCOUNTER — Ambulatory Visit
Admission: RE | Admit: 2021-03-31 | Discharge: 2021-03-31 | Disposition: A | Discharge status: Home / Self Care | Payer: PPO | Source: Ambulatory Visit | Attending: Internal Medicine | Admitting: Internal Medicine

## 2021-03-31 DIAGNOSIS — Z1231 Encounter for screening mammogram for malignant neoplasm of breast: Secondary | ICD-10-CM | POA: Diagnosis not present

## 2021-04-01 ENCOUNTER — Telehealth: Payer: Self-pay | Admitting: Internal Medicine

## 2021-04-01 ENCOUNTER — Other Ambulatory Visit: Payer: Self-pay

## 2021-04-01 ENCOUNTER — Other Ambulatory Visit (HOSPITAL_COMMUNITY): Payer: Self-pay | Admitting: Cardiology

## 2021-04-01 ENCOUNTER — Encounter (HOSPITAL_COMMUNITY)
Admission: RE | Admit: 2021-04-01 | Discharge: 2021-04-01 | Disposition: A | Discharge status: Home / Self Care | Payer: PPO | Source: Ambulatory Visit | Attending: Cardiology | Admitting: Cardiology

## 2021-04-01 DIAGNOSIS — I5032 Chronic diastolic (congestive) heart failure: Secondary | ICD-10-CM

## 2021-04-01 NOTE — Telephone Encounter (Signed)
Pt called back and has been scheduled for a virtual on 04/22/2021 (Thursday) @ 9:45 w/Laura

## 2021-04-01 NOTE — Telephone Encounter (Signed)
Left message for patient to call back and schedule Medicare Annual Wellness Visit (AWV) either virtually or in office.   Last AWV 04/21/20   Can have any time  healthteam calendar year  please schedule at anytime with [facility] Nurse Health Advisor 1 or 2   This should be a 45 minute visit.

## 2021-04-01 NOTE — Progress Notes (Signed)
Daily Session Note  Patient Details  Name: [name] MRN: [id number] Date of Birth: Apr 24, 1951 Referring Provider:   [name] Pulmonary Rehab Walk Test from 03/15/2021 in [place]  Referring Provider [place]       Encounter Date: 04/01/2021  Check In:  Session Check In - 04/01/21 1430       Check-In   Supervising physician immediately available to respond to emergencies [facility] immediately available    Physician(s) Dr. Karleen Hampshire    [facility]    Staff Present Rodney Langton, RN;Jessica Hassell Done, MS, ACSM-CEP, Exercise Physiologist;Joan Leonia Reeves, RN, Quentin Ore, MS, ACSM-CEP, Exercise Physiologist    Virtual Visit No    Medication changes reported     No    Fall or balance concerns reported    No    Tobacco Cessation No Change    Warm-up and Cool-down Performed as group-led instruction    Resistance Training Performed Yes    VAD Patient? No    PAD/SET Patient? No      Pain Assessment   Currently in Pain? No/denies    Multiple Pain Sites No             Capillary Blood Glucose: No results found for this or any previous visit (from the past 24 hour(s)).    Social History   Tobacco Use  Smoking Status Never  Smokeless Tobacco Never    Goals Met:  Proper associated with RPD/PD & O2 Sat Exercise tolerated well No report of cardiac concerns or symptoms Strength training completed today  Goals Unmet:  Not Applicable  Comments: Service time is from 1315 to 1430    Dr. Fransico Him is Medical Director for Cardiac Rehab at [facility].

## 2021-04-02 NOTE — Telephone Encounter (Signed)
Documented on spreadsheet

## 2021-04-06 ENCOUNTER — Encounter (HOSPITAL_COMMUNITY): Payer: PPO

## 2021-04-08 ENCOUNTER — Encounter (HOSPITAL_COMMUNITY)
Admission: RE | Admit: 2021-04-08 | Discharge: 2021-04-08 | Disposition: A | Discharge status: Home / Self Care | Payer: PPO | Source: Ambulatory Visit | Attending: Cardiology | Admitting: Cardiology

## 2021-04-08 ENCOUNTER — Other Ambulatory Visit: Payer: Self-pay

## 2021-04-08 DIAGNOSIS — I5032 Chronic diastolic (congestive) heart failure: Secondary | ICD-10-CM | POA: Insufficient documentation

## 2021-04-08 NOTE — Progress Notes (Signed)
Daily Session Note  Patient Details  Name: [name] MRN: [id number] Date of Birth: Mar 20, 1951 Referring Provider:   [name] Pulmonary Rehab Walk Test from 03/15/2021 in [place]  Referring Provider [place]       Encounter Date: 04/08/2021  Check In:  Session Check In - 04/08/21 1443       Check-In   Supervising physician immediately available to respond to emergencies [facility] immediately available    Physician(s) Dr. Broadus John    [facility]    Staff Present Rosebud Poles, RN, Milus Glazier, MS, ACSM-CEP, CCRP, Exercise Physiologist;Carlette Wilber Oliphant, RN, BSN;Ramon Dredge, RN, MHA;Lisa Ysidro Evert, RN    Virtual Visit No    Medication changes reported     No    Fall or balance concerns reported    No    Tobacco Cessation No Change    Warm-up and Cool-down Performed as group-led instruction    Resistance Training Performed Yes    VAD Patient? No    PAD/SET Patient? No      Pain Assessment   Currently in Pain? No/denies    Multiple Pain Sites No             Capillary Blood Glucose: No results found for this or any previous visit (from the past 24 hour(s)).    Social History   Tobacco Use  Smoking Status Never  Smokeless Tobacco Never    Goals Met:  Proper associated with RPD/PD & O2 Sat Exercise tolerated well No report of cardiac concerns or symptoms Strength training completed today  Goals Unmet:  Not Applicable  Comments: Service time is from 1315  to 1433    Dr. Fransico Him is Medical Director for Cardiac Rehab at [facility].

## 2021-04-09 DIAGNOSIS — J9610 Chronic respiratory failure, unspecified whether with hypoxia or hypercapnia: Secondary | ICD-10-CM | POA: Diagnosis not present

## 2021-04-09 DIAGNOSIS — G4733 Obstructive sleep apnea (adult) (pediatric): Secondary | ICD-10-CM | POA: Diagnosis not present

## 2021-04-13 ENCOUNTER — Encounter (HOSPITAL_COMMUNITY)
Admission: RE | Admit: 2021-04-13 | Discharge: 2021-04-13 | Disposition: A | Discharge status: Home / Self Care | Payer: PPO | Source: Ambulatory Visit | Attending: Cardiology | Admitting: Cardiology

## 2021-04-13 ENCOUNTER — Other Ambulatory Visit: Payer: Self-pay

## 2021-04-13 DIAGNOSIS — I5032 Chronic diastolic (congestive) heart failure: Secondary | ICD-10-CM

## 2021-04-13 NOTE — Progress Notes (Signed)
Daily Session Note  Patient Details  Name: [name] MRN: [id number] Date of Birth: [birth year] Referring Provider:   [name] Pulmonary Rehab Walk Test from 03/15/2021 in [place]  Referring Provider [place]       Encounter Date: 04/13/2021  Check In:  Session Check In - 04/13/21 1502       Check-In   Supervising physician immediately available to respond to emergencies [facility] immediately available    Physician(s) Dr. Broadus John    [facility]    Staff Present Rosebud Poles, RN, BSN;Carlette [place] Oliphant, RN, Isaac Laud, MS, ACSM-CEP, Exercise Physiologist;Lisa Ysidro Evert, RN    Virtual Visit No    Medication changes reported     No    Fall or balance concerns reported    No    Tobacco Cessation No Change    Warm-up and Cool-down Performed as group-led instruction    Resistance Training Performed Yes    VAD Patient? No    PAD/SET Patient? No      Pain Assessment   Currently in Pain? No/denies    Multiple Pain Sites No             Capillary Blood Glucose: No results found for this or any previous visit (from the past 24 hour(s)).   Exercise Prescription Changes - 04/13/21 1500       Response to Exercise   Blood Pressure (Admit) 100/60    Blood Pressure (Exercise) 110/70    Blood Pressure (Exit) 96/70    Heart Rate (Admit) 60 bpm    Heart Rate (Exercise) 75 bpm    Heart Rate (Exit) 66 bpm    Oxygen Saturation (Admit) 95 %    Oxygen Saturation (Exercise) 90 %    Oxygen Saturation (Exit) 94 %    Rating of Perceived Exertion (Exercise) 11    Perceived Dyspnea (Exercise) 1    Duration Continue with 30 min of aerobic exercise without signs/symptoms of physical distress.    Intensity --   40-80 % HRR     Progression   Progression Continue to progress workloads to maintain intensity without signs/symptoms of physical distress.      Resistance Training   Training Prescription Yes     Weight Red bands    Reps 10-15    Time 10 Minutes      NuStep   Level 3    SPM 70    Minutes 30    METs 1.9             Social History   Tobacco Use  Smoking Status Never  Smokeless Tobacco Never    Goals Met:  Proper associated with RPD/PD & O2 Sat Exercise tolerated well No report of cardiac concerns or symptoms Strength training completed today  Goals Unmet:  Not Applicable  Comments: Service time is from 1321 to 1445    Dr. Fransico Him is Medical Director for Cardiac Rehab at [facility].

## 2021-04-15 ENCOUNTER — Other Ambulatory Visit: Payer: Self-pay

## 2021-04-15 ENCOUNTER — Encounter (HOSPITAL_COMMUNITY)
Admission: RE | Admit: 2021-04-15 | Discharge: 2021-04-15 | Disposition: A | Discharge status: Home / Self Care | Payer: PPO | Source: Ambulatory Visit | Attending: Cardiology | Admitting: Cardiology

## 2021-04-15 DIAGNOSIS — I5032 Chronic diastolic (congestive) heart failure: Secondary | ICD-10-CM

## 2021-04-15 NOTE — Progress Notes (Signed)
Daily Session Note  Patient Details  Name: [name] MRN: [id number] Date of Birth: Jun 20, 1951 Referring Provider:   [name] Pulmonary Rehab Walk Test from 03/15/2021 in [place]  Referring Provider [place]       Encounter Date: 04/15/2021  Check In:  Session Check In - 04/15/21 1430       Check-In   Supervising physician immediately available to respond to emergencies [facility] immediately available    Physician(s) Dr. Alfredia Ferguson    [facility]    Staff Present Rosebud Poles, RN, BSN;Jetta Walker BS, ACSM EP-C, Exercise Physiologist;Lisa Clarisa Schools, MS, ACSM-CEP, Exercise Physiologist    Virtual Visit No    Medication changes reported     No    Fall or balance concerns reported    No    Tobacco Cessation No Change    Warm-up and Cool-down Performed as group-led instruction    Resistance Training Performed Yes    VAD Patient? No    PAD/SET Patient? No      Pain Assessment   Currently in Pain? No/denies    Multiple Pain Sites No             Capillary Blood Glucose: No results found for this or any previous visit (from the past 24 hour(s)).    Social History   Tobacco Use  Smoking Status Never  Smokeless Tobacco Never    Goals Met:  Proper associated with RPD/PD & O2 Sat Exercise tolerated well No report of cardiac concerns or symptoms Strength training completed today  Goals Unmet:  Not Applicable  Comments: Service time is from 1315 to 1436.    Dr. Fransico Him is Medical Director for Cardiac Rehab at [facility].

## 2021-04-19 ENCOUNTER — Other Ambulatory Visit: Payer: Self-pay

## 2021-04-19 ENCOUNTER — Ambulatory Visit
Admission: RE | Admit: 2021-04-19 | Discharge: 2021-04-19 | Disposition: A | Discharge status: Home / Self Care | Payer: PPO | Source: Ambulatory Visit | Attending: Internal Medicine | Admitting: Internal Medicine

## 2021-04-19 DIAGNOSIS — Z780 Asymptomatic menopausal state: Secondary | ICD-10-CM | POA: Diagnosis not present

## 2021-04-19 DIAGNOSIS — E2839 Other primary ovarian failure: Secondary | ICD-10-CM

## 2021-04-20 ENCOUNTER — Encounter (HOSPITAL_COMMUNITY)
Admission: RE | Admit: 2021-04-20 | Discharge: 2021-04-20 | Disposition: A | Discharge status: Home / Self Care | Payer: PPO | Source: Ambulatory Visit | Attending: Cardiology | Admitting: Cardiology

## 2021-04-20 ENCOUNTER — Other Ambulatory Visit: Payer: Self-pay | Admitting: Internal Medicine

## 2021-04-20 ENCOUNTER — Other Ambulatory Visit (HOSPITAL_COMMUNITY): Payer: Self-pay | Admitting: Cardiology

## 2021-04-20 DIAGNOSIS — I5032 Chronic diastolic (congestive) heart failure: Secondary | ICD-10-CM

## 2021-04-20 NOTE — Progress Notes (Signed)
Daily Session Note  Patient Details  Name: [name] MRN: [id number] Date of Birth: 01-Sep-1951 Referring Provider:   [name] Pulmonary Rehab Walk Test from 03/15/2021 in [place]  Referring Provider [place]       Encounter Date: 04/20/2021  Check In:  Session Check In - 04/20/21 1421       Check-In   Supervising physician immediately available to respond to emergencies [facility] immediately available    Physician(s) Dr. Alfredia Ferguson    Location [facility]    Staff Present Rodney Langton, RN;Joan Leonia Reeves, RN, Bjorn Loser, MS, CEP, Exercise Physiologist;David Lilyan Punt, MS, ACSM-CEP, CCRP, Exercise Physiologist    Virtual Visit No    Medication changes reported     No    Fall or balance concerns reported    No    Tobacco Cessation No Change    Warm-up and Cool-down Performed as group-led instruction    Resistance Training Performed Yes    VAD Patient? No    PAD/SET Patient? No      Pain Assessment   Currently in Pain? No/denies    Multiple Pain Sites No             Capillary Blood Glucose: No results found for this or any previous visit (from the past 24 hour(s)).    Social History   Tobacco Use  Smoking Status Never  Smokeless Tobacco Never    Goals Met:  Exercise tolerated well No report of cardiac concerns or symptoms Strength training completed today  Goals Unmet:  Not Applicable  Comments: Service time is from 1325 to 1430    Dr. Fransico Him is Medical Director for Cardiac Rehab at [facility].

## 2021-04-20 NOTE — Progress Notes (Signed)
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 necessary to achieve weight loss of 6-24 lb at graduation from cardiac rehab.             Personal Goal #2 Re-Evaluation   Personal Goal #2 Pt to build a healthy plate including vegetables, fruits, whole grains, and low-fat dairy products in a heart healthy meal plan. Pt to build a healthy plate including vegetables, fruits, whole grains, and low-fat dairy products in a heart healthy meal plan.               Nutrition Goals Discharge (Final Nutrition Goals Re-Evaluation):  Nutrition Goals Re-Evaluation - 04/15/21 0819       Goals   Current Weight 254 lb 6.6 oz (115.4 kg)    Nutrition Goal Pt to identify food quantities  necessary to achieve weight loss of 6-24 lb at graduation from cardiac rehab.      Personal Goal #2 Re-Evaluation   Personal Goal #2 Pt to build a healthy plate including vegetables, fruits, whole grains, and low-fat dairy products in a heart healthy meal plan.             Psychosocial: Target Goals: Acknowledge presence or absence of significant depression and/or stress, maximize coping skills, provide positive support system. Participant is able to verbalize types and ability to use techniques and skills needed for reducing stress and depression.  Initial Review & Psychosocial Screening:  Initial Psych Review & Screening - 03/15/21 0935       Initial Review   Current issues with None Identified      Family Dynamics   Good Support System? Yes   friends, sister and brother     Barriers   Psychosocial barriers to participate in program There are no identifiable barriers or psychosocial needs.      Screening Interventions   Interventions Encouraged to exercise             Quality of Life Scores:  Scores of 19 and below usually indicate a poorer quality of life in these areas.  A difference of  2-3 points is a clinically meaningful difference.  A difference of 2-3 points in the total score of the Quality of Life Index has been associated with significant improvement in overall quality of life, self-image, physical symptoms, and general health in studies assessing change in quality of life.  PHQ-9: Recent Review Flowsheet Data     Depression screen [facility] 2/9 03/15/2021 04/21/2020 01/24/2018 03/27/2017 12/27/2016   Decreased Interest 0 0 0 0 1   Down, Depressed, Hopeless 0 0 0 0 1   PHQ - 2 Score 0 0 0 0 2   Altered sleeping 0 0 - - -   Tired, decreased energy 3  0 - - -   Change in appetite 0 0 - - -   Feeling bad or failure about yourself  0 0 - - -   Trouble concentrating 0 0 - - -   Moving slowly or fidgety/restless 0 0 - - -   Suicidal thoughts 0 0 - - -   PHQ-9 Score -  0 - - -   Difficult doing work/chores Not difficult at all Not difficult at all - - -      Interpretation of Total Score  Total Score Depression Severity:  1-4 = Minimal depression, 5-9 = Mild depression, 10-14 = Moderate depression, 15-19 = Moderately severe depression, 20-27 = Severe depression   Psychosocial Evaluation and Intervention:  Psychosocial Evaluation - 03/15/21 0141  Psychosocial Evaluation & Interventions   Interventions Encouraged to exercise with the program and follow exercise prescription    Comments No concerns identified at this time    Expected Outcomes For [name] to continue to have no concerns while in pulmonary rehab.    Continue Psychosocial Services  No Follow up required             Psychosocial Re-Evaluation:  Psychosocial Re-Evaluation     [place] Name 03/22/21 1407 04/19/21 1433           Psychosocial Re-Evaluation   Current issues with None Identified None Identified      Comments No change in psychosocial since orientation and walk test.  She begins exercising in pulmonary rehab 03/23/2021. No concerns identified      Expected Outcomes For [name] to continue to remain free of psychosocial concerns while participating in pulmonary rehab. To continue to have no psychosocial concerns while participating in pulmonary rehab.      Interventions Encouraged to attend Pulmonary Rehabilitation for the exercise Encouraged to attend Pulmonary Rehabilitation for the exercise      Continue Psychosocial Services  No Follow up required No Follow up required               Psychosocial Discharge (Final Psychosocial Re-Evaluation):  Psychosocial Re-Evaluation - 04/19/21 1433       Psychosocial Re-Evaluation   Current issues with None Identified    Comments No concerns identified    Expected Outcomes To continue to have no psychosocial concerns while participating in pulmonary rehab.    Interventions Encouraged to attend Pulmonary Rehabilitation for  the exercise    Continue Psychosocial Services  No Follow up required             Education: Education Goals: Education classes will be provided on a weekly basis, covering required topics. Participant will state understanding/return demonstration of topics presented.  Learning Barriers/Preferences:  Learning Barriers/Preferences - 03/15/21 4650       Learning Barriers/Preferences   Learning Preferences Audio;Computer/Internet;Group Instruction;Individual Instruction;Pictoral;Skilled Demonstration;Verbal Instruction;Video;Written Material             Education Topics: Risk Factor Reduction:  -Group instruction that is supported by a PowerPoint presentation. Instructor discusses the definition of a risk factor, different risk factors for pulmonary disease, and how the heart and lungs work together.     Nutrition for Pulmonary Patient:  -Group instruction provided by PowerPoint slides, verbal discussion, and written materials to support subject matter. The instructor gives an explanation and review of healthy diet recommendations, which includes a discussion on weight management, recommendations for fruit and vegetable consumption, as well as protein, fluid, caffeine, fiber, sodium, sugar, and alcohol. Tips for eating when patients are short of breath are discussed. Flowsheet Row PULMONARY REHAB OTHER RESPIRATORY from 09/14/2017 in [place]  Date 09/07/17  Educator Nutritionist  Instruction Review Code (Retired) R- Review/reinforce       Pursed Lip Breathing:  -Group instruction that is supported by demonstration and informational handouts. Instructor discusses the benefits of pursed lip and diaphragmatic breathing and detailed demonstration on how to preform both.     Oxygen Safety:  -Group instruction provided by PowerPoint, verbal discussion, and written material to support subject matter. There is an overview of "What is Oxygen" and "Why  do we need it".  Instructor also reviews how to create a safe environment for oxygen use, the importance of using oxygen as prescribed, and the risks of noncompliance. There is  a brief discussion on traveling with oxygen and resources the patient may utilize. Flowsheet Row PULMONARY REHAB OTHER RESPIRATORY from 09/14/2017 in [place]  Date 06/29/17  Educator Truddie Crumble  Instruction Review Code (Retired) 2- meets goals/outcomes       Oxygen Equipment:  -Group instruction provided by [employer], Network engineer, and Insurance underwriter. Flowsheet Row PULMONARY REHAB OTHER RESPIRATORY from 09/14/2017 in [place]  Date 08/03/17  Educator Ace Gins  Instruction Review Code (Retired) 2- meets goals/outcomes       Signs and Symptoms:  -Group instruction provided by written material and verbal discussion to support subject matter. Warning signs and symptoms of infection, stroke, and heart attack are reviewed and when to call the physician/911 reinforced. Tips for preventing the spread of infection discussed. Flowsheet Row PULMONARY REHAB OTHER RESPIRATORY from 09/14/2017 in [place]  Date 05/25/17  Educator RN  Instruction Review Code (Retired) 2- meets goals/outcomes       Advanced Directives:  -Group instruction provided by verbal instruction and written material to support subject matter. Instructor reviews Advanced Directive laws and proper instruction for filling out document.   Pulmonary Video:  -Group video education that reviews the importance of medication and oxygen compliance, exercise, good nutrition, pulmonary hygiene, and pursed lip and diaphragmatic breathing for the pulmonary patient. Flowsheet Row PULMONARY REHAB OTHER RESPIRATORY from 09/14/2017 in [place]  Date 06/01/17  Instruction Review Code (Retired) 2-  meets goals/outcomes       Exercise for the Pulmonary Patient:  -Group instruction that is supported by a PowerPoint presentation. Instructor discusses benefits of exercise, core components of exercise, frequency, duration, and intensity of an exercise routine, importance of utilizing pulse oximetry during exercise, safety while exercising, and options of places to exercise outside of rehab.   Flowsheet Row PULMONARY REHAB OTHER RESPIRATORY from 09/14/2017 in [place]  Date 07/20/17  Educator Cloyde Reams  Instruction Review Code (Retired) 2- meets goals/outcomes       Pulmonary Medications:  -Engineer, maintenance (IT) group education provided by Art therapist with focus on inhaled medications and proper administration. Flowsheet Row PULMONARY REHAB OTHER RESPIRATORY from 04/15/2021 in [place]  Date 04/08/21  Educator handout       Anatomy and Physiology of the Respiratory System and Intimacy:  -Group instruction provided by PowerPoint, verbal discussion, and written material to support subject matter. Instructor reviews respiratory cycle and anatomical components of the respiratory system and their functions. Instructor also reviews differences in obstructive and restrictive respiratory diseases with examples of each. Intimacy, Sex, and Sexuality differences are reviewed with a discussion on how relationships can change when diagnosed with pulmonary disease. Common sexual concerns are reviewed. Flowsheet Row PULMONARY REHAB OTHER RESPIRATORY from 04/15/2021 in [place]  Date 04/15/21  Educator handout       MD DAY -A group question and answer session with a medical doctor that allows participants to ask questions that relate to their pulmonary disease state. Flowsheet Row PULMONARY REHAB OTHER RESPIRATORY from 09/14/2017 in [place]  Date 09/14/17  Educator  Nelda Marseille  Instruction Review Code (Retired) R- Review/reinforce       OTHER EDUCATION -Group or individual verbal, written, or video instructions that support the educational goals of the pulmonary rehab program. [place] from 04/15/2021 in [place]  [place]  Date 04/01/21  Educator [place]  Instruction Review Code 1- Verbalizes Understanding       Holiday Eating Survival Tips:  -Group instruction provided by PowerPoint slides, verbal discussion, and written materials to support subject matter. The instructor gives patients tips, tricks, and techniques to help them not only survive but enjoy the holidays despite the onslaught of food that accompanies the holidays.   Knowledge Questionnaire Score:  Knowledge Questionnaire Score - 03/15/21 1036       Knowledge Questionnaire Score   Pre Score 15/18             Core Components/Risk Factors/Patient Goals at Admission:  Personal Goals and Risk Factors at Admission - 03/15/21 0938       Core Components/Risk Factors/Patient Goals on Admission   Heart Failure Yes    Intervention Provide a combined exercise and nutrition program that is supplemented with education, support and counseling about heart failure. Directed toward relieving symptoms such as shortness of breath, decreased exercise tolerance, and extremity edema.    Expected Outcomes Improve functional capacity of life;Short term: Attendance in program 2-3 days a week with increased exercise capacity. Reported lower sodium intake. Reported increased fruit and vegetable intake. Reports medication compliance.;Short term: Daily weights obtained and reported for increase. Utilizing diuretic protocols set by physician.;Long term: Adoption of self-care skills and reduction of barriers for early signs and symptoms recognition and intervention leading to self-care maintenance.             Core  Components/Risk Factors/Patient Goals Review:   Goals and Risk Factor Review     Row Name 03/22/21 1409 04/19/21 1434           Core Components/Risk Factors/Patient Goals Review   Personal Goals Review Heart Failure Heart Failure      Review [name] will begin exercising in pulmonary rehab 03/23/2021. Heart failure is stable, she weighs herself daily and follows a low sodium diet and takes her medications correctly.      Expected Outcomes See admission goals. See admission goals.               Core Components/Risk Factors/Patient Goals at Discharge (Final Review):   Goals and Risk Factor Review - 04/19/21 1434       Core Components/Risk Factors/Patient Goals Review   Personal Goals Review Heart Failure    Review Heart failure is stable, she weighs herself daily and follows a low sodium diet and takes her medications correctly.    Expected Outcomes See admission goals.             ITP Comments:   Comments: ITP REVIEW Pt is making expected progress toward pulmonary rehab goals after completing 7 sessions. Recommend continued exercise, life style modification, education, and utilization of breathing techniques to increase stamina and strength and decrease shortness of breath with exertion.

## 2021-04-22 ENCOUNTER — Ambulatory Visit (INDEPENDENT_AMBULATORY_CARE_PROVIDER_SITE_OTHER): Payer: PPO

## 2021-04-22 ENCOUNTER — Encounter (HOSPITAL_COMMUNITY)
Admission: RE | Admit: 2021-04-22 | Discharge: 2021-04-22 | Disposition: A | Discharge status: Home / Self Care | Payer: PPO | Source: Ambulatory Visit | Attending: Cardiology | Admitting: Cardiology

## 2021-04-22 ENCOUNTER — Other Ambulatory Visit: Payer: Self-pay

## 2021-04-22 DIAGNOSIS — I5032 Chronic diastolic (congestive) heart failure: Secondary | ICD-10-CM | POA: Diagnosis not present

## 2021-04-22 DIAGNOSIS — Z0000 Encounter for general adult medical examination without abnormal findings: Secondary | ICD-10-CM

## 2021-04-22 NOTE — Progress Notes (Signed)
Daily Session Note  Patient Details  Name: [name] MRN: [id number] Date of Birth: [birth year] Referring Provider:   [name] Pulmonary Rehab Walk Test from 03/15/2021 in [place]  Referring Provider [name]       Encounter Date: 04/22/2021  Check In:  Session Check In - 04/22/21 1426       Check-In   Supervising physician immediately available to respond to emergencies [facility] immediately available    Physician(s) Dr. Mal Misty    Location [facility]    Staff Present Trish Fountain, RN, Quentin Ore, MS, ACSM-CEP, Exercise Physiologist;David Lilyan Punt, MS, ACSM-CEP, CCRP, Exercise Physiologist    Virtual Visit No    Medication changes reported     No    Fall or balance concerns reported    No    Tobacco Cessation No Change    Warm-up and Cool-down Performed as group-led instruction    Resistance Training Performed Yes    VAD Patient? No    PAD/SET Patient? No      Pain Assessment   Currently in Pain? No/denies    Multiple Pain Sites No             Capillary Blood Glucose: No results found for this or any previous visit (from the past 24 hour(s)).    Social History   Tobacco Use  Smoking Status Never  Smokeless Tobacco Never    Goals Met:  Proper associated with RPD/PD & O2 Sat Independence with exercise equipment Exercise tolerated well No report of cardiac concerns or symptoms Strength training completed today  Goals Unmet:  Not Applicable  Comments: Service time is from 1326 to 1430   Dr. Fransico Him is Medical Director for Cardiac Rehab at [facility].

## 2021-04-22 NOTE — Patient Instructions (Signed)
[name] , Thank you for taking time to come for your Medicare Wellness Visit. I appreciate your ongoing commitment to your health goals. Please review the following plan we discussed and let me know if I can assist you in the future.   Screening recommendations/referrals: Colonoscopy: [facility]  03/29/2020  due 2024 Mammogram: 03/31/2021 Bone Density: 04/19/2021 Recommended yearly ophthalmology/optometry visit for glaucoma screening and checkup Recommended yearly dental visit for hygiene and checkup  Vaccinations: Influenza vaccine: due in fall 2022  Pneumococcal vaccine: completed series  Tdap vaccine: 07/16/2013 Shingles vaccine: completed series     Advanced directives: none   Conditions/risks identified: none   Next appointment: 05/19/2021  230pm  Dr.Panosh    Preventive Care 72 Years and Older, Female Preventive care refers to lifestyle choices and visits with your health care provider that can promote health and wellness. What does preventive care include? A yearly physical exam. This is also called an annual well check. Dental exams once or twice a year. Routine eye exams. Ask your health care provider how often you should have your eyes checked. Personal lifestyle choices, including: Daily care of your teeth and gums. Regular physical activity. Eating a healthy diet. Avoiding tobacco and drug use. Limiting alcohol use. Practicing safe sex. Taking low-dose aspirin every day. Taking vitamin and mineral supplements as recommended by your health care provider. What happens during an annual well check? The services and screenings done by your health care provider during your annual well check will depend on your age, overall health, lifestyle risk factors, and family history of disease. Counseling  Your health care provider may ask you questions about your: Alcohol use. Tobacco use. Drug use. Emotional well-being. Home and relationship well-being. Sexual  activity. Eating habits. History of falls. Memory and ability to understand (cognition). Work and work Statistician. Reproductive health. Screening  You may have the following tests or measurements: Height, weight, and BMI. Blood pressure. Lipid and cholesterol levels. These may be checked every 5 years, or more frequently if you are over 34 years old. Skin check. Lung cancer screening. You may have this screening every year starting at age 83 if you have a 30-pack-year history of smoking and currently smoke or have quit within the past 15 years. Fecal occult blood test (FOBT) of the stool. You may have this test every year starting at age 58. Flexible sigmoidoscopy or colonoscopy. You may have a sigmoidoscopy every 5 years or a colonoscopy every 10 years starting at age 34. Hepatitis C blood test. Hepatitis B blood test. Sexually transmitted disease (STD) testing. Diabetes screening. This is done by checking your blood sugar (glucose) after you have not eaten for a while (fasting). You may have this done every 1-3 years. Bone density scan. This is done to screen for osteoporosis. You may have this done starting at age 18. Mammogram. This may be done every 1-2 years. Talk to your health care provider about how often you should have regular mammograms. Talk with your health care provider about your test results, treatment options, and if necessary, the need for more tests. Vaccines  Your health care provider may recommend certain vaccines, such as: Influenza vaccine. This is recommended every year. Tetanus, diphtheria, and acellular pertussis (Tdap, Td) vaccine. You may need a Td booster every 10 years. Zoster vaccine. You may need this after age 1. Pneumococcal 13-valent conjugate (PCV13) vaccine. One dose is recommended after age 77. Pneumococcal polysaccharide (PPSV23) vaccine. One dose is recommended after age 97. Talk to your  health care provider about which screenings and vaccines  you need and how often you need them. This information is not intended to replace advice given to you by your health care provider. Make sure you discuss any questions you have with your health care provider. Document Released: 09/18/2015 Document Revised: 05/11/2016 Document Reviewed: 06/23/2015 Elsevier Interactive Patient Education  2017 [place] Prevention in the Home Falls can cause injuries. They can happen to people of all ages. There are many things you can do to make your home safe and to help prevent falls. What can I do on the outside of my home? Regularly fix the edges of walkways and driveways and fix any cracks. Remove anything that might make you trip as you walk through a door, such as a raised step or threshold. Trim any bushes or trees on the path to your home. Use bright outdoor lighting. Clear any walking paths of anything that might make someone trip, such as rocks or tools. Regularly check to see if handrails are loose or broken. Make sure that both sides of any steps have handrails. Any raised decks and porches should have guardrails on the edges. Have any leaves, snow, or ice cleared regularly. Use sand or salt on walking paths during winter. Clean up any spills in your garage right away. This includes oil or grease spills. What can I do in the bathroom? Use night lights. Install grab bars by the toilet and in the tub and shower. Do not use towel bars as grab bars. Use non-skid mats or decals in the tub or shower. If you need to sit down in the shower, use a plastic, non-slip stool. Keep the floor dry. Clean up any water that spills on the floor as soon as it happens. Remove soap buildup in the tub or shower regularly. Attach bath mats securely with double-sided non-slip rug tape. Do not have throw rugs and other things on the floor that can make you trip. What can I do in the bedroom? Use night lights. Make sure that you have a light by your bed that  is easy to reach. Do not use any sheets or blankets that are too big for your bed. They should not hang down onto the floor. Have a firm chair that has side arms. You can use this for support while you get dressed. Do not have throw rugs and other things on the floor that can make you trip. What can I do in the kitchen? Clean up any spills right away. Avoid walking on wet floors. Keep items that you use a lot in easy-to-reach places. If you need to reach something above you, use a strong step stool that has a grab bar. Keep electrical cords out of the way. Do not use floor polish or wax that makes floors slippery. If you must use wax, use non-skid floor wax. Do not have throw rugs and other things on the floor that can make you trip. What can I do with my stairs? Do not leave any items on the stairs. Make sure that there are handrails on both sides of the stairs and use them. Fix handrails that are broken or loose. Make sure that handrails are as long as the stairways. Check any carpeting to make sure that it is firmly attached to the stairs. Fix any carpet that is loose or worn. Avoid having throw rugs at the top or bottom of the stairs. If you do have throw rugs, attach them  to the floor with carpet tape. Make sure that you have a light switch at the top of the stairs and the bottom of the stairs. If you do not have them, ask someone to add them for you. What else can I do to help prevent falls? Wear shoes that: Do not have high heels. Have rubber bottoms. Are comfortable and fit you well. Are closed at the toe. Do not wear sandals. If you use a stepladder: Make sure that it is fully opened. Do not climb a closed stepladder. Make sure that both sides of the stepladder are locked into place. Ask someone to hold it for you, if possible. Clearly mark and make sure that you can see: Any grab bars or handrails. First and last steps. Where the edge of each step is. Use tools that help you  move around (mobility aids) if they are needed. These include: Canes. Walkers. Scooters. Crutches. Turn on the lights when you go into a dark area. Replace any light bulbs as soon as they burn out. Set up your furniture so you have a clear path. Avoid moving your furniture around. If any of your floors are uneven, fix them. If there are any pets around you, be aware of where they are. Review your medicines with your doctor. Some medicines can make you feel dizzy. This can increase your chance of falling. Ask your doctor what other things that you can do to help prevent falls. This information is not intended to replace advice given to you by your health care provider. Make sure you discuss any questions you have with your health care provider. Document Released: 06/18/2009 Document Revised: 01/28/2016 Document Reviewed: 09/26/2014 Elsevier Interactive Patient Education  2017 [employer].
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
## 2021-04-24 ENCOUNTER — Other Ambulatory Visit (HOSPITAL_COMMUNITY): Payer: Self-pay | Admitting: Cardiology

## 2021-04-25 NOTE — Progress Notes (Signed)
Reported normal bone density

## 2021-04-27 ENCOUNTER — Other Ambulatory Visit: Payer: Self-pay

## 2021-04-27 ENCOUNTER — Encounter (HOSPITAL_COMMUNITY)
Admission: RE | Admit: 2021-04-27 | Discharge: 2021-04-27 | Disposition: A | Discharge status: Home / Self Care | Payer: PPO | Source: Ambulatory Visit | Attending: Cardiology | Admitting: Cardiology

## 2021-04-27 VITALS — Wt 250.9 lb

## 2021-04-27 DIAGNOSIS — I5032 Chronic diastolic (congestive) heart failure: Secondary | ICD-10-CM

## 2021-04-27 NOTE — Progress Notes (Signed)
Daily Session Note  Patient Details  Name: [name] MRN: [id number] Date of Birth: 08/17/1951 Referring Provider:   [name] Pulmonary Rehab Walk Test from 03/15/2021 in [place]  Referring Provider [place]       Encounter Date: 04/27/2021  Check In:  Session Check In - 04/27/21 1438       Check-In   Supervising physician immediately available to respond to emergencies [facility] immediately available    Physician(s) [place]    Location [facility]    Staff Present Leda Roys, MS, ACSM-CEP, Exercise Physiologist;Yeny Schmoll Rosana Hoes, MS, ACSM-CEP, Exercise Physiologist;Lisa Ysidro Evert, RN    Virtual Visit No    Medication changes reported     No    Fall or balance concerns reported    No    Tobacco Cessation No Change    Warm-up and Cool-down Performed as group-led instruction    Resistance Training Performed Yes    VAD Patient? No    PAD/SET Patient? No      Pain Assessment   Currently in Pain? No/denies    Multiple Pain Sites No             Capillary Blood Glucose: No results found for this or any previous visit (from the past 24 hour(s)).   Exercise Prescription Changes - 04/27/21 1500       Response to Exercise   Blood Pressure (Admit) 108/58    Blood Pressure (Exercise) 120/68    Blood Pressure (Exit) 102/58    Heart Rate (Admit) 74 bpm    Heart Rate (Exercise) 76 bpm    Heart Rate (Exit) 66 bpm    Oxygen Saturation (Admit) 97 %    Oxygen Saturation (Exercise) 91 %    Oxygen Saturation (Exit) 94 %    Rating of Perceived Exertion (Exercise) 11    Perceived Dyspnea (Exercise) 1    Duration Continue with 30 min of aerobic exercise without signs/symptoms of physical distress.    Intensity THRR unchanged      Progression   Progression Continue to progress workloads to maintain intensity without signs/symptoms of physical distress.      Resistance Training   Training Prescription Yes     Weight Red bands    Reps 10-15    Time 10 Minutes      NuStep   Level 4    SPM 80    Minutes 30    METs 2             Social History   Tobacco Use  Smoking Status Never  Smokeless Tobacco Never    Goals Met:  Proper associated with RPD/PD & O2 Sat Independence with exercise equipment Exercise tolerated well No report of cardiac concerns or symptoms Strength training completed today  Goals Unmet:  Not Applicable  Comments: Service time is from 1328 to 1431    Dr. Fransico Him is Medical Director for Cardiac Rehab at [facility].

## 2021-04-29 ENCOUNTER — Encounter (HOSPITAL_COMMUNITY)
Admission: RE | Admit: 2021-04-29 | Discharge: 2021-04-29 | Disposition: A | Discharge status: Home / Self Care | Payer: PPO | Source: Ambulatory Visit | Attending: Cardiology | Admitting: Cardiology

## 2021-04-29 ENCOUNTER — Other Ambulatory Visit: Payer: Self-pay

## 2021-04-29 DIAGNOSIS — I5032 Chronic diastolic (congestive) heart failure: Secondary | ICD-10-CM

## 2021-04-29 NOTE — Progress Notes (Signed)
Daily Session Note  Patient Details  Name: [name] MRN: [id number] Date of Birth: 1950-11-19 Referring Provider:   [name] Pulmonary Rehab Walk Test from 03/15/2021 in [place]  Referring Provider [place]       Encounter Date: 04/29/2021  Check In:  Session Check In - 04/29/21 1438       Check-In   Supervising physician immediately available to respond to emergencies [facility] immediately available    Physician(s) Dr. Sloan Leiter    [facility]    Staff Present Leda Roys, MS, ACSM-CEP, Exercise Physiologist;Kaylee Rosana Hoes, MS, ACSM-CEP, Exercise Physiologist;Getsemani Lindon Ysidro Evert, RN    Virtual Visit No    Medication changes reported     No    Fall or balance concerns reported    No    Tobacco Cessation No Change    Warm-up and Cool-down Performed as group-led instruction    Resistance Training Performed Yes    VAD Patient? No      Pain Assessment   Currently in Pain? No/denies    Multiple Pain Sites No             Capillary Blood Glucose: No results found for this or any previous visit (from the past 24 hour(s)).    Social History   Tobacco Use  Smoking Status Never  Smokeless Tobacco Never    Goals Met:  Exercise tolerated well No report of concerns or symptoms today Strength training completed today  Goals Unmet:  Not Applicable  Comments: Service time is from 1315 to [place]    Dr. Fransico Him is Medical Director for Cardiac Rehab at [facility].

## 2021-05-04 ENCOUNTER — Encounter (HOSPITAL_COMMUNITY)
Admission: RE | Admit: 2021-05-04 | Discharge: 2021-05-04 | Disposition: A | Discharge status: Home / Self Care | Payer: PPO | Source: Ambulatory Visit | Attending: Cardiology | Admitting: Cardiology

## 2021-05-04 ENCOUNTER — Other Ambulatory Visit: Payer: Self-pay

## 2021-05-04 DIAGNOSIS — I5032 Chronic diastolic (congestive) heart failure: Secondary | ICD-10-CM | POA: Diagnosis not present

## 2021-05-04 NOTE — Progress Notes (Signed)
Daily Session Note  Patient Details  Name: [name] MRN: [id number] Date of Birth: 11/26/1950 Referring Provider:   [name] Pulmonary Rehab Walk Test from 03/15/2021 in [place]  Referring Provider [place]       Encounter Date: 05/04/2021  Check In:  Session Check In - 05/04/21 1507       Check-In   Supervising physician immediately available to respond to emergencies [facility] immediately available    Physician(s) Dr.Pahwani    [facility]    Staff Present Rodney Langton, RN;Genie Mirabal Leonia Reeves, RN, Deland Pretty, MS, ACSM CEP, Exercise Physiologist;Kaylee Rosana Hoes, MS, ACSM-CEP, Exercise Physiologist    Virtual Visit No    Medication changes reported     No    Fall or balance concerns reported    No    Tobacco Cessation No Change    Warm-up and Cool-down Performed as group-led instruction    Resistance Training Performed Yes    VAD Patient? No    PAD/SET Patient? No      Pain Assessment   Currently in Pain? No/denies    Multiple Pain Sites No             Capillary Blood Glucose: No results found for this or any previous visit (from the past 24 hour(s)).    Social History   Tobacco Use  Smoking Status Never  Smokeless Tobacco Never    Goals Met:  Proper associated with RPD/PD & O2 Sat Exercise tolerated well No report of concerns or symptoms today Strength training completed today  Goals Unmet:  Not Applicable  Comments: Service time is from 1325 to 1435.    Dr. Fransico Him is Medical Director for Cardiac Rehab at [facility].

## 2021-05-06 ENCOUNTER — Other Ambulatory Visit: Payer: Self-pay

## 2021-05-06 ENCOUNTER — Encounter (HOSPITAL_COMMUNITY)
Admission: RE | Admit: 2021-05-06 | Discharge: 2021-05-06 | Disposition: A | Discharge status: Home / Self Care | Payer: PPO | Source: Ambulatory Visit | Attending: Cardiology | Admitting: Cardiology

## 2021-05-06 DIAGNOSIS — I5032 Chronic diastolic (congestive) heart failure: Secondary | ICD-10-CM

## 2021-05-06 NOTE — Progress Notes (Signed)
Daily Session Note  Patient Details  Name: Toniqua Melamed MRN: [id number] Date of Birth: [birth year] Referring Provider:   [name] Pulmonary Rehab Walk Test from 03/15/2021 in [place]  Referring Provider [place]       Encounter Date: 05/06/2021  Check In:  Session Check In - 05/06/21 1458       Check-In   Supervising physician immediately available to respond to emergencies [facility] immediately available    Physician(s) Dr. Doristine Bosworth    [facility]    Staff Present Elmon Else, MS, ACSM-CEP, Exercise Physiologist;Terrez Ander Ysidro Evert, RN;Joan Leonia Reeves, RN, BSN    Virtual Visit No    Medication changes reported     No    Fall or balance concerns reported    No    Tobacco Cessation No Change    Warm-up and Cool-down Performed as group-led Higher education careers adviser Performed Yes    VAD Patient? No    PAD/SET Patient? No      Pain Assessment   Currently in Pain? No/denies    Multiple Pain Sites No             Capillary Blood Glucose: No results found for this or any previous visit (from the past 24 hour(s)).    Social History   Tobacco Use  Smoking Status Never  Smokeless Tobacco Never    Goals Met:  Exercise tolerated well No report of concerns or symptoms today Strength training completed today  Goals Unmet:  Not Applicable  Comments: Service time is from 1321 to 1427    Dr. Fransico Him is Medical Director for Cardiac Rehab at [facility].

## 2021-05-10 ENCOUNTER — Other Ambulatory Visit: Payer: Self-pay | Admitting: Internal Medicine

## 2021-05-10 ENCOUNTER — Other Ambulatory Visit (HOSPITAL_COMMUNITY): Payer: Self-pay | Admitting: Cardiology

## 2021-05-11 ENCOUNTER — Other Ambulatory Visit: Payer: Self-pay

## 2021-05-11 ENCOUNTER — Encounter (HOSPITAL_COMMUNITY)
Admission: RE | Admit: 2021-05-11 | Discharge: 2021-05-11 | Disposition: A | Discharge status: Home / Self Care | Payer: PPO | Source: Ambulatory Visit | Attending: Cardiology | Admitting: Cardiology

## 2021-05-11 VITALS — Wt 252.4 lb

## 2021-05-11 DIAGNOSIS — I5032 Chronic diastolic (congestive) heart failure: Secondary | ICD-10-CM

## 2021-05-11 NOTE — Progress Notes (Signed)
Daily Session Note  Patient Details  Name: [name] MRN: [id number] Date of Birth: Jul 30, 1951 Referring Provider:   [name] Pulmonary Rehab Walk Test from 03/15/2021 in [place]  Referring Provider [place]       Encounter Date: 05/11/2021  Check In:  Session Check In - 05/11/21 1448       Check-In   Supervising physician immediately available to respond to emergencies [facility] immediately available    Physician(s) Dr. Doristine Bosworth    [facility]    Staff Present Elmon Else, MS, ACSM-CEP, Exercise Physiologist;Lisa Ysidro Evert, RN;Joan Leonia Reeves, RN, BSN    Virtual Visit No    Medication changes reported     No    Fall or balance concerns reported    No    Tobacco Cessation No Change    Warm-up and Cool-down Performed as group-led Higher education careers adviser Performed Yes    VAD Patient? No    PAD/SET Patient? No      Pain Assessment   Currently in Pain? No/denies    Multiple Pain Sites No             Capillary Blood Glucose: No results found for this or any previous visit (from the past 24 hour(s)).   Exercise Prescription Changes - 05/11/21 1400       Response to Exercise   Blood Pressure (Admit) 110/60    Blood Pressure (Exercise) 112/60    Blood Pressure (Exit) 100/60    Heart Rate (Admit) 69 bpm    Heart Rate (Exercise) 99 bpm    Heart Rate (Exit) 69 bpm    Oxygen Saturation (Admit) 95 %    Oxygen Saturation (Exercise) 89 %    Oxygen Saturation (Exit) 92 %    Rating of Perceived Exertion (Exercise) 15    Perceived Dyspnea (Exercise) 1    Duration Continue with 30 min of aerobic exercise without signs/symptoms of physical distress.    Intensity THRR unchanged      Progression   Progression Continue to progress workloads to maintain intensity without signs/symptoms of physical distress.      Resistance Training   Training Prescription Yes    Weight Red bands     Reps 10-15    Time 10 Minutes      NuStep   Level 5    SPM 80    Minutes 15    METs 2.1      Track   Laps 7    Minutes 15    METs 1.81      Home Exercise Plan   Plans to continue exercise at [facility] (comment)   and home   Frequency Add 1 additional day to program exercise sessions.    Initial Home Exercises Provided 05/11/21             Social History   Tobacco Use  Smoking Status Never  Smokeless Tobacco Never    Goals Met:  Proper associated with RPD/PD & O2 Sat Exercise tolerated well No report of concerns or symptoms today Strength training completed today  Goals Unmet:  Not Applicable  Comments: Service time is from 1315 to 1430    Dr. Fransico Him is Medical Director for Cardiac Rehab at [facility].

## 2021-05-11 NOTE — Progress Notes (Signed)
I have reviewed a Home Exercise Prescription with [name] . [name] is currently exercising at home. [name] is doing her home exercise video 2 non-rehab days/wk for 30 min/day. She is also wanting to attend the [facility] again to exercise on the [place] and track. [name] and I discussed how to progress their exercise prescription. I recommended adding 1 additional non-rehab day into her exercise regimen.The patient stated that her goal was to maintain her current functional capacity. The patient stated that they understand the exercise prescription.  We reviewed exercise guidelines, target heart rate during exercise, RPE Scale, weather conditions, endpoints for exercise, warmup and cool down.  Patient is encouraged to come to me with any questions. I will continue to follow up with the patient to assist them with progression and safety.    Sheppard Plumber, MS, ACSM-CEP 05/11/2021 3:03 PM

## 2021-05-12 ENCOUNTER — Other Ambulatory Visit: Payer: Self-pay | Admitting: Internal Medicine

## 2021-05-13 ENCOUNTER — Other Ambulatory Visit: Payer: Self-pay

## 2021-05-13 ENCOUNTER — Encounter (HOSPITAL_COMMUNITY)
Admission: RE | Admit: 2021-05-13 | Discharge: 2021-05-13 | Disposition: A | Discharge status: Home / Self Care | Payer: PPO | Source: Ambulatory Visit | Attending: Cardiology | Admitting: Cardiology

## 2021-05-13 DIAGNOSIS — I5032 Chronic diastolic (congestive) heart failure: Secondary | ICD-10-CM | POA: Diagnosis not present

## 2021-05-13 NOTE — Progress Notes (Signed)
Daily Session Note  Patient Details  Name: [name] MRN: [id number] Date of Birth: 23-Mar-1951 Referring Provider:   [name] Pulmonary Rehab Walk Test from 03/15/2021 in [place]  Referring Provider [place]       Encounter Date: 05/13/2021  Check In:  Session Check In - 05/13/21 1452       Check-In   Supervising physician immediately available to respond to emergencies [facility] immediately available    Physician(s) Dr. Florene Glen    [facility]    Staff Present Rosebud Poles, RN, Quentin Ore, MS, ACSM-CEP, Exercise Physiologist;Suheyla Mortellaro Ysidro Evert, RN    Virtual Visit No    Medication changes reported     No    Fall or balance concerns reported    No    Tobacco Cessation No Change    Warm-up and Cool-down Performed as group-led instruction    Resistance Training Performed Yes    VAD Patient? No    PAD/SET Patient? No      Pain Assessment   Currently in Pain? No/denies    Multiple Pain Sites No             Capillary Blood Glucose: No results found for this or any previous visit (from the past 24 hour(s)).    Social History   Tobacco Use  Smoking Status Never  Smokeless Tobacco Never    Goals Met:  Exercise tolerated well No report of concerns or symptoms today Strength training completed today  Goals Unmet:  Not Applicable  Comments: Service time is from 1317 to 1430 Pt used her pulsed O2 at 2L walking on the track today and saturation maintained greater than 88%.    Dr. Fransico Him is Medical Director for Cardiac Rehab at [facility].

## 2021-05-18 ENCOUNTER — Other Ambulatory Visit: Payer: Self-pay

## 2021-05-18 ENCOUNTER — Telehealth: Payer: Self-pay | Admitting: Pharmacist

## 2021-05-18 ENCOUNTER — Encounter (HOSPITAL_COMMUNITY)
Admission: RE | Admit: 2021-05-18 | Discharge: 2021-05-18 | Disposition: A | Discharge status: Home / Self Care | Payer: PPO | Source: Ambulatory Visit | Attending: Cardiology | Admitting: Cardiology

## 2021-05-18 DIAGNOSIS — I5032 Chronic diastolic (congestive) heart failure: Secondary | ICD-10-CM | POA: Diagnosis not present

## 2021-05-18 NOTE — Chronic Care Management (AMB) (Signed)
Chronic Care Management Pharmacy Assistant   Name: [name]  MRN: [id number] DOB: May 25, 1951  Called and rescheduled patients upcoming appointment with [name] the clinical pharmacist. Patient was agreeable to newly scheduled appointment date and time. Patient thanked me for my call.   Medications: Outpatient Encounter Medications as of 05/18/2021  Medication Sig   acetaZOLAMIDE (DIAMOX) 250 MG tablet TAKE 1 TABLET BY MOUTH EVERY DAY   ADCIRCA 20 MG tablet TAKE 2 TABLETS BY MOUTH ONCE DAILY.   allopurinol (ZYLOPRIM) 100 MG tablet TAKE 1 TABLET BY MOUTH TWICE A DAY   atenolol (TENORMIN) 50 MG tablet TAKE 1 TABLET (50 MG TOTAL) BY MOUTH DAILY. MUST BE SEEN FOR FURTHER REFILLS   Cholecalciferol (VITAMIN D) 125 MCG (5000 UT) CAPS Take 5,000 Units by mouth 3 (three) times a week.    ciprofloxacin (CIPRO) 250 MG tablet Take 1 tablet (250 mg total) by mouth 2 (two) times daily.   Cyanocobalamin 5000 MCG TBDP Take 5,000 mcg by mouth 3 (three) times a week.    diclofenac sodium (VOLTAREN) 1 % GEL Apply 2 g topically 2 (two) times daily as needed (for arthritis).   empagliflozin (JARDIANCE) 10 MG TABS tablet Take 1 tablet (10 mg total) by mouth daily.   furosemide (LASIX) 20 MG tablet TAKE 2 TABLETS BY MOUTH TWICE A DAY   levothyroxine (SYNTHROID) 75 MCG tablet TAKE 1 TABLET BY MOUTH EVERY DAY   potassium chloride (KLOR-CON) 10 MEQ tablet TAKE 2 TABLETS BY MOUTH TWICE A DAY   rosuvastatin (CRESTOR) 5 MG tablet TAKE 1 TABLET (5 MG TOTAL) BY MOUTH DAILY. MUST BE SEEN IN OFFICE FOR FURTHER REFILLS   traMADol (ULTRAM) 50 MG tablet Take 1 tablet (50 mg total) by mouth 3 (three) times daily as needed.   XARELTO 20 MG TABS tablet TAKE 1 TABLET (20 MG TOTAL) BY MOUTH DAILY WITH SUPPER. MUST BE SEEN FOR FURTHER REFILLS   [DISCONTINUED] TOPAMAX 50 MG tablet Take 50 mg by mouth.   No facility-administered encounter medications on file as of 05/18/2021.    Care Gaps:  AWV - completed  on 04/22/21 Flu vaccine - due Covid-19 vaccine booster 5 - overdue since 04/13/21  Star Rating Drugs:  Rosuvastatin 12m - [facility] [place] [facility] ([phone]

## 2021-05-18 NOTE — Progress Notes (Signed)
Daily Session Note  Patient Details  Name: [name] MRN: [id number] Date of Birth: August 12, 1951 Referring Provider:   [name] Pulmonary Rehab Walk Test from 03/15/2021 in [employer]  Referring Provider [place]       Encounter Date: 05/18/2021  Check In:  Session Check In - 05/18/21 1454       Check-In   Supervising physician immediately available to respond to emergencies [facility] immediately available    Physician(s) Dr. Florene Glen    [facility]    Staff Present Rosebud Poles, RN, Quentin Ore, MS, ACSM-CEP, Exercise Physiologist;Carlette Wilber Oliphant, RN, BSN    Virtual Visit No    Medication changes reported     No    Fall or balance concerns reported    No    Tobacco Cessation No Change    Warm-up and Cool-down Performed as group-led instruction    Resistance Training Performed Yes    VAD Patient? No      Pain Assessment   Currently in Pain? No/denies    Multiple Pain Sites No             Capillary Blood Glucose: No results found for this or any previous visit (from the past 24 hour(s)).    Social History   Tobacco Use  Smoking Status Never  Smokeless Tobacco Never    Goals Met:  Proper associated with RPD/PD & O2 Sat Independence with exercise equipment Exercise tolerated well No report of concerns or symptoms today Strength training completed today  Goals Unmet:  Not Applicable  Comments: Service time is from 1318 to 1430    Dr. Fransico Him is Medical Director for Cardiac Rehab at [facility].

## 2021-05-19 ENCOUNTER — Other Ambulatory Visit (HOSPITAL_COMMUNITY): Payer: Self-pay | Admitting: Cardiology

## 2021-05-19 ENCOUNTER — Ambulatory Visit (INDEPENDENT_AMBULATORY_CARE_PROVIDER_SITE_OTHER): Payer: PPO | Admitting: Internal Medicine

## 2021-05-19 ENCOUNTER — Encounter: Payer: Self-pay | Admitting: Internal Medicine

## 2021-05-19 VITALS — BP 130/76 | HR 60 | Temp 98.2°F | Ht 60.5 in | Wt 252.0 lb

## 2021-05-19 DIAGNOSIS — Z79899 Other long term (current) drug therapy: Secondary | ICD-10-CM | POA: Diagnosis not present

## 2021-05-19 DIAGNOSIS — Z23 Encounter for immunization: Secondary | ICD-10-CM

## 2021-05-19 DIAGNOSIS — Z8739 Personal history of other diseases of the musculoskeletal system and connective tissue: Secondary | ICD-10-CM

## 2021-05-19 DIAGNOSIS — R269 Unspecified abnormalities of gait and mobility: Secondary | ICD-10-CM | POA: Diagnosis not present

## 2021-05-19 DIAGNOSIS — D649 Anemia, unspecified: Secondary | ICD-10-CM

## 2021-05-19 DIAGNOSIS — I10 Essential (primary) hypertension: Secondary | ICD-10-CM
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
## 2021-05-19 NOTE — Patient Instructions (Signed)
Good to  see  you today .   Finish pulmonary   rehab and  then contact  consider PT referral for gait assessment and help .   Will let  you  know results .   And go from there .  If stable then plan  ROV  yearly in about 6 months

## 2021-05-20 ENCOUNTER — Encounter (HOSPITAL_COMMUNITY)
Admission: RE | Admit: 2021-05-20 | Discharge: 2021-05-20 | Disposition: A | Discharge status: Home / Self Care | Payer: PPO | Source: Ambulatory Visit | Attending: Cardiology | Admitting: Cardiology

## 2021-05-20 ENCOUNTER — Other Ambulatory Visit: Payer: Self-pay

## 2021-05-20 DIAGNOSIS — I5032 Chronic diastolic (congestive) heart failure: Secondary | ICD-10-CM | POA: Diagnosis not present

## 2021-05-20 LAB — CBC WITH DIFFERENTIAL/PLATELET
Basophils Absolute: 0.1 10*3/uL (ref 0.0–0.1)
Basophils Relative: 1.2 % (ref 0.0–3.0)
Eosinophils Absolute: 0.3 10*3/uL (ref 0.0–0.7)
Eosinophils Relative: 3.9 % (ref 0.0–5.0)
HCT: 44.9 % (ref 36.0–46.0)
Hemoglobin: 14.3 g/dL (ref 12.0–15.0)
Lymphocytes Relative: 17.9 % (ref 12.0–46.0)
Lymphs Abs: 1.3 10*3/uL (ref 0.7–4.0)
MCHC: 31.9 g/dL (ref 30.0–36.0)
MCV: 100.5 fl — ABNORMAL HIGH (ref 78.0–100.0)
Monocytes Absolute: 0.5 10*3/uL (ref 0.1–1.0)
Monocytes Relative: 6.8 % (ref 3.0–12.0)
Neutro Abs: 5.1 10*3/uL (ref 1.4–7.7)
Neutrophils Relative %: 70.2 % (ref 43.0–77.0)
Platelets: 133 10*3/uL — ABNORMAL LOW (ref 150.0–400.0)
RBC: 4.47 Mil/uL (ref 3.87–5.11)
RDW: 16 % — ABNORMAL HIGH (ref 11.5–15.5)
WBC: 7.3 10*3/uL (ref 4.0–10.5)

## 2021-05-20 LAB — IBC + FERRITIN
Ferritin: 74.3 ng/mL (ref 10.0–291.0)
Iron: 92 ug/dL (ref 42–145)
Saturation Ratios: 28.1 % (ref 20.0–50.0)
TIBC: 327.6 ug/dL (ref 250.0–450.0)
Transferrin: 234 mg/dL (ref 212.0–360.0)

## 2021-05-20 LAB — HEMOGLOBIN A1C: Hgb A1c MFr Bld: 5.8 % (ref 4.6–6.5)

## 2021-05-20 LAB — BASIC METABOLIC PANEL
BUN: 25 mg/dL — ABNORMAL HIGH (ref 6–23)
CO2: 25 mEq/L (ref 19–32)
Calcium: 9.1 mg/dL (ref 8.4–10.5)
Chloride: 109 mEq/L (ref 96–112)
Creatinine, Ser: 1.34 mg/dL — ABNORMAL HIGH (ref 0.40–1.20)
GFR: 39.83 mL/min — ABNORMAL LOW (ref >60.00)
Glucose, Bld: 89 mg/dL (ref 70–99)
Potassium: 3.9 mEq/L (ref 3.5–5.1)
Sodium: 144 mEq/L (ref 135–145)

## 2021-05-20 LAB — HEPATIC FUNCTION PANEL
ALT: 8 U/L (ref 0–35)
AST: 18 U/L (ref 0–37)
Albumin: 4.1 g/dL (ref 3.5–5.2)
Alkaline Phosphatase: 83 U/L (ref 39–117)
Bilirubin, Direct: 0.2 mg/dL (ref 0.0–0.3)
Total Bilirubin: 0.7 mg/dL (ref 0.2–1.2)
Total Protein: 6.2 g/dL (ref 6.0–8.3)

## 2021-05-20 NOTE — Progress Notes (Signed)
Daily Session Note  Patient Details  Name: [name] MRN: [id number] Date of Birth: [birth year] Referring Provider:   [name] Pulmonary Rehab Walk Test from 03/15/2021 in [place]  Referring Provider [place]       Encounter Date: 05/20/2021  Check In:  Session Check In - 05/20/21 1427       Check-In   Supervising physician immediately available to respond to emergencies [facility] immediately available    Physician(s) Dr. Lysbeth Galas    [facility]    Staff Present Rosebud Poles, RN, Quentin Ore, MS, ACSM-CEP, Exercise Physiologist    Virtual Visit No    Medication changes reported     No    Fall or balance concerns reported    No    Tobacco Cessation No Change    Warm-up and Cool-down Performed as group-led instruction    Resistance Training Performed Yes    VAD Patient? No    PAD/SET Patient? No      Pain Assessment   Currently in Pain? No/denies    Multiple Pain Sites No             Capillary Blood Glucose: Results for orders placed or performed in visit on 05/19/21 (from the past 24 hour(s))  Hepatic function panel     Status: None   Collection Time: 05/19/21  3:18 PM  Result Value Ref Range   Total Bilirubin 0.7 0.2 - 1.2 mg/dL   Bilirubin, Direct 0.2 0.0 - 0.3 mg/dL   Alkaline Phosphatase 83 39 - 117 U/L   AST 18 0 - 37 U/L   ALT 8 0 - 35 U/L   Total Protein 6.2 6.0 - 8.3 g/dL   Albumin 4.1 3.5 - 5.2 g/dL  Hemoglobin A1c     Status: None   Collection Time: 05/19/21  3:18 PM  Result Value Ref Range   Hgb A1c MFr Bld 5.8 4.6 - 6.5 %  CBC with Differential/Platelet     Status: Abnormal   Collection Time: 05/19/21  3:18 PM  Result Value Ref Range   WBC 7.3 4.0 - 10.5 K/uL   RBC 4.47 3.87 - 5.11 Mil/uL   Hemoglobin 14.3 12.0 - 15.0 g/dL   HCT 44.9 36.0 - 46.0 %   MCV 100.5 (H) 78.0 - 100.0 fl   MCHC 31.9 30.0 - 36.0 g/dL   RDW 16.0 (H) 11.5 - 15.5 %   Platelets  133.0 (L) 150.0 - 400.0 K/uL   Neutrophils Relative % 70.2 43.0 - 77.0 %   Lymphocytes Relative 17.9 12.0 - 46.0 %   Monocytes Relative 6.8 3.0 - 12.0 %   Eosinophils Relative 3.9 0.0 - 5.0 %   Basophils Relative 1.2 0.0 - 3.0 %   Neutro Abs 5.1 1.4 - 7.7 K/uL   Lymphs Abs 1.3 0.7 - 4.0 K/uL   Monocytes Absolute 0.5 0.1 - 1.0 K/uL   Eosinophils Absolute 0.3 0.0 - 0.7 K/uL   Basophils Absolute 0.1 0.0 - 0.1 K/uL  Basic metabolic panel     Status: Abnormal   Collection Time: 05/19/21  3:18 PM  Result Value Ref Range   Sodium 144 135 - 145 mEq/L   Potassium 3.9 3.5 - 5.1 mEq/L   Chloride 109 96 - 112 mEq/L   CO2 25 19 - 32 mEq/L   Glucose, Bld 89 70 - 99 mg/dL   BUN 25 (H) 6 - 23 mg/dL   Creatinine, Ser 1.34 (H)  0.40 - 1.20 mg/dL   GFR 39.83 (L) >60.00 mL/min   Calcium 9.1 8.4 - 10.5 mg/dL  IBC + Ferritin     Status: None   Collection Time: 05/19/21  3:18 PM  Result Value Ref Range   Iron 92 42 - 145 ug/dL   Transferrin 234.0 212.0 - 360.0 mg/dL   Saturation Ratios 28.1 20.0 - 50.0 %   Ferritin 74.3 10.0 - 291.0 ng/mL   TIBC 327.6 250.0 - 450.0 mcg/dL      Social History   Tobacco Use  Smoking Status Never  Smokeless Tobacco Never    Goals Met:  Proper associated with RPD/PD & O2 Sat Exercise tolerated well No report of concerns or symptoms today Strength training completed today  Goals Unmet:  Not Applicable  Comments: Service time is from 1328 to [place]    Dr. Fransico Him is Medical Director for Cardiac Rehab at [facility].

## 2021-05-23 NOTE — Progress Notes (Signed)
Liver tests normal , iron level are better  and no anemia now.renal function decreased some as in past . Blood sugar normal Forwarding info to  cardiology team

## 2021-05-24 NOTE — Progress Notes (Addendum)
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 monitor and progress as she is able. Enda has completed 15 exercise sessions. She is slowly progressing on the Nustep and has started walking the track. She averages 2.1 METs on the Nustep at level 5 and 1.81 METs on the track. Kiyah has to take breaks on the track due to her SOB. She has brought in her POC to exercise on at 2L. She is motivated to exercise and progress. Will continue to monitor and progress as able.     Expected Outcomes Through exercise at rehab and home, the patient will decrease shortness of breath with daily activities and feel confident in carrying out an exercise regimn at home. Through exercise at rehab and home, the patient will decrease shortness of breath with daily activities and feel confident in carrying out an exercise regimn at home. Through exercise at rehab and home,  the patient will decrease shortness of breath with daily activities and feel confident in carrying out an exercise regimn at home.              Discharge Exercise Prescription (Final Exercise Prescription Changes):  Exercise Prescription Changes - 05/11/21 1400       Response to Exercise   Blood Pressure (Admit) 110/60    Blood Pressure (Exercise) 112/60    Blood Pressure (Exit) 100/60    Heart Rate (Admit) 69 bpm    Heart Rate (Exercise) 99 bpm    Heart Rate (Exit) 69 bpm  Oxygen Saturation (Admit) 95 %    Oxygen Saturation (Exercise) 89 %    Oxygen Saturation (Exit) 92 %    Rating of Perceived Exertion (Exercise) 15    Perceived Dyspnea (Exercise) 1    Duration Continue with 30 min of aerobic exercise without signs/symptoms of physical distress.    Intensity THRR unchanged      Progression   Progression Continue to progress workloads to maintain intensity without signs/symptoms of physical distress.      Resistance Training   Training Prescription Yes    Weight Red bands    Reps 10-15    Time 10 Minutes      NuStep   Level 5    SPM 80    Minutes 15    METs 2.1      Track   Laps 7    Minutes 15    METs 1.81      Home Exercise Plan   Plans to continue exercise at [facility] (comment)   and home   Frequency Add 1 additional day to program exercise sessions.    Initial Home Exercises Provided 05/11/21             Nutrition:  Target Goals: Understanding of nutrition guidelines, daily intake of sodium <1537m, cholesterol <2078m calories 30% from fat and 7% or less from saturated fats, daily to have 5 or more servings of fruits and vegetables.  Biometrics:  Pre Biometrics - 03/15/21 0929       Pre Biometrics   Height 5' 0.5" (1.537 m)    Weight 116.4 kg    BMI (Calculated) 49.27    Grip Strength 16.5 kg              Nutrition Therapy Plan and Nutrition Goals:  Nutrition Therapy & Goals - 03/30/21 1449       Nutrition Therapy    Diet TLC      Personal Nutrition Goals   Nutrition Goal Pt to identify food quantities necessary to achieve weight loss of 6-24 lb at graduation from cardiac rehab.    Personal Goal #2 Pt to build a healthy plate including vegetables, fruits, whole grains, and low-fat dairy products in a heart healthy meal plan.      Intervention Plan   Intervention Prescribe, educate and counsel regarding individualized specific dietary modifications aiming towards targeted core components such as weight, hypertension, lipid management, diabetes, heart failure and other comorbidities.;Nutrition handout(s) given to patient.    Expected Outcomes Short Term Goal: Understand basic principles of dietary content, such as calories, fat, sodium, cholesterol and nutrients.             Nutrition Assessments:  MEDIFICTS Score Key: ?70 Need to make dietary changes  40-70 Heart Healthy Diet ? 40 Therapeutic Level Cholesterol Diet  Flowsheet Row PULMONARY REHAB OTHER RESPIRATORY [facility] on [facility] 62      Picture Your Plate Scores: <4<67nhealthy dietary pattern with much room for improvement. 41-50 Dietary pattern unlikely to meet recommendations for good health and room for improvement. 51-60 More healthful dietary pattern, with some room for improvement.  >60 Healthy dietary pattern, although there may be some specific behaviors that could be improved.    Nutrition Goals Re-Evaluation:  Nutrition Goals Re-Evaluation     [place] 03/30/21 1451 04/15/21 0819 05/21/21 1418         Goals   Current Weight 256 lb (  116.1 kg) 254 lb 6.6 oz (115.4 kg) 252 lb 13.9 oz (114.7 kg)     Nutrition Goal Pt to identify food quantities necessary to achieve weight loss of 6-24 lb at graduation from cardiac rehab. Pt to identify food quantities necessary to achieve weight loss of 6-24 lb at graduation from cardiac rehab. Pt to identify  food quantities necessary to achieve weight loss of 6-24 lb at graduation from cardiac rehab.           Personal Goal #2 Re-Evaluation   Personal Goal #2 Pt to build a healthy plate including vegetables, fruits, whole grains, and low-fat dairy products in a heart healthy meal plan. Pt to build a healthy plate including vegetables, fruits, whole grains, and low-fat dairy products in a heart healthy meal plan. Pt to build a healthy plate including vegetables, fruits, whole grains, and low-fat dairy products in a heart healthy meal plan.              Nutrition Goals Discharge (Final Nutrition Goals Re-Evaluation):  Nutrition Goals Re-Evaluation - 05/21/21 1418       Goals   Current Weight 252 lb 13.9 oz (114.7 kg)    Nutrition Goal Pt to identify food quantities necessary to achieve weight loss of 6-24 lb at graduation from cardiac rehab.      Personal Goal #2 Re-Evaluation   Personal Goal #2 Pt to build a healthy plate including vegetables, fruits, whole grains, and low-fat dairy products in a heart healthy meal plan.             Psychosocial: Target Goals: Acknowledge presence or absence of significant depression and/or stress, maximize coping skills, provide positive support system. Participant is able to verbalize types and ability to use techniques and skills needed for reducing stress and depression.  Initial Review & Psychosocial Screening:  Initial Psych Review & Screening - 03/15/21 0935       Initial Review   Current issues with None Identified      Family Dynamics   Good Support System? Yes   friends, sister and brother     Barriers   Psychosocial barriers to participate in program There are no identifiable barriers or psychosocial needs.      Screening Interventions   Interventions Encouraged to exercise             Quality of Life Scores:  Scores of 19 and below usually indicate a poorer quality of life in these areas.  A difference of  2-3 points is  a clinically meaningful difference.  A difference of 2-3 points in the total score of the Quality of Life Index has been associated with significant improvement in overall quality of life, self-image, physical symptoms, and general health in studies assessing change in quality of life.  PHQ-9: Recent Review Flowsheet Data     Depression screen [facility] 2/9 04/22/2021 04/22/2021 03/15/2021 04/21/2020 01/24/2018   Decreased Interest 0 0 0 0 0   Down, Depressed, Hopeless 0 0 0 0 0   PHQ - 2 Score 0 0 0 0 0   Altered sleeping - - 0 0 -   Tired, decreased energy - - 3  0 -   Change in appetite - - 0 0 -   Feeling bad or failure about yourself  - - 0 0 -   Trouble concentrating - - 0 0 -   Moving slowly or fidgety/restless - - 0 0 -   Suicidal thoughts - - 0 0 -  PHQ-9 Score - - - 0 -   Difficult doing work/chores - - Not difficult at all Not difficult at all -      Interpretation of Total Score  Total Score Depression Severity:  1-4 = Minimal depression, 5-9 = Mild depression, 10-14 = Moderate depression, 15-19 = Moderately severe depression, 20-27 = Severe depression   Psychosocial Evaluation and Intervention:  Psychosocial Evaluation - 03/15/21 0937       Psychosocial Evaluation & Interventions   Interventions Encouraged to exercise with the program and follow exercise prescription    Comments No concerns identified at this time    Expected Outcomes For Rena to continue to have no concerns while in pulmonary rehab.    Continue Psychosocial Services  No Follow up required             Psychosocial Re-Evaluation:  Psychosocial Re-Evaluation     Tynan Name 03/22/21 1407 04/19/21 1433 05/18/21 0931         Psychosocial Re-Evaluation   Current issues with None Identified None Identified None Identified     Comments No change in psychosocial since orientation and walk test.  She begins exercising in pulmonary rehab 03/23/2021. No concerns identified No concerns identified at this time.      Expected Outcomes For Sary to continue to remain free of psychosocial concerns while participating in pulmonary rehab. To continue to have no psychosocial concerns while participating in pulmonary rehab. For Braelynne to continue to be free of psychosocial concerns while participating in pulmonary rehab.     Interventions Encouraged to attend Pulmonary Rehabilitation for the exercise Encouraged to attend Pulmonary Rehabilitation for the exercise Encouraged to attend Pulmonary Rehabilitation for the exercise     Continue Psychosocial Services  No Follow up required No Follow up required No Follow up required              Psychosocial Discharge (Final Psychosocial Re-Evaluation):  Psychosocial Re-Evaluation - 05/18/21 0931       Psychosocial Re-Evaluation   Current issues with None Identified    Comments No concerns identified at this time.    Expected Outcomes For Aylanie to continue to be free of psychosocial concerns while participating in pulmonary rehab.    Interventions Encouraged to attend Pulmonary Rehabilitation for the exercise    Continue Psychosocial Services  No Follow up required             Education: Education Goals: Education classes will be provided on a weekly basis, covering required topics. Participant will state understanding/return demonstration of topics presented.  Learning Barriers/Preferences:  Learning Barriers/Preferences - 03/15/21 7412       Learning Barriers/Preferences   Learning Preferences Audio;Computer/Internet;Group Instruction;Individual Instruction;Pictoral;Skilled Demonstration;Verbal Instruction;Video;Written Material             Education Topics: Risk Factor Reduction:  -Group instruction that is supported by a PowerPoint presentation. Instructor discusses the definition of a risk factor, different risk factors for pulmonary disease, and how the heart and lungs work together.   Flowsheet Row PULMONARY REHAB OTHER RESPIRATORY from  05/13/2021 in [place]  Date 05/13/21  Educator Handout       Nutrition for Pulmonary Patient:  -Group instruction provided by PowerPoint slides, verbal discussion, and written materials to support subject matter. The instructor gives an explanation and review of healthy diet recommendations, which includes a discussion on weight management, recommendations for fruit and vegetable consumption, as well as protein, fluid, caffeine, fiber, sodium, sugar, and alcohol. Tips  for eating when patients are short of breath are discussed. Flowsheet Row PULMONARY REHAB OTHER RESPIRATORY from 09/14/2017 in [place]  Date 09/07/17  Educator Nutritionist  Instruction Review Code (Retired) R- Review/reinforce       Pursed Lip Breathing:  -Group instruction that is supported by demonstration and informational handouts. Instructor discusses the benefits of pursed lip and diaphragmatic breathing and detailed demonstration on how to preform both.     Oxygen Safety:  -Group instruction provided by PowerPoint, verbal discussion, and written material to support subject matter. There is an overview of "What is Oxygen" and "Why do we need it".  Instructor also reviews how to create a safe environment for oxygen use, the importance of using oxygen as prescribed, and the risks of noncompliance. There is a brief discussion on traveling with oxygen and resources the patient may utilize. Flowsheet Row PULMONARY REHAB OTHER RESPIRATORY from 09/14/2017 in [place]  Date 06/29/17  Educator Truddie Crumble  Instruction Review Code (Retired) 2- meets goals/outcomes       Oxygen Equipment:  -Group instruction provided by [employer], Network engineer, and Insurance underwriter. Flowsheet Row PULMONARY REHAB OTHER RESPIRATORY from 09/14/2017 in [place]  Date 08/03/17   Educator Ace Gins  Instruction Review Code (Retired) 2- meets goals/outcomes       Signs and Symptoms:  -Group instruction provided by written material and verbal discussion to support subject matter. Warning signs and symptoms of infection, stroke, and heart attack are reviewed and when to call the physician/911 reinforced. Tips for preventing the spread of infection discussed. Flowsheet Row PULMONARY REHAB OTHER RESPIRATORY from 09/14/2017 in [place]  Date 05/25/17  Educator RN  Instruction Review Code (Retired) 2- meets goals/outcomes       Advanced Directives:  -Group instruction provided by verbal instruction and written material to support subject matter. Instructor reviews Advanced Directive laws and proper instruction for filling out document.   Pulmonary Video:  -Group video education that reviews the importance of medication and oxygen compliance, exercise, good nutrition, pulmonary hygiene, and pursed lip and diaphragmatic breathing for the pulmonary patient. Flowsheet Row PULMONARY REHAB OTHER RESPIRATORY from 09/14/2017 in [place]  Date 06/01/17  Instruction Review Code (Retired) 2- meets goals/outcomes       Exercise for the Pulmonary Patient:  -Group instruction that is supported by a PowerPoint presentation. Instructor discusses benefits of exercise, core components of exercise, frequency, duration, and intensity of an exercise routine, importance of utilizing pulse oximetry during exercise, safety while exercising, and options of places to exercise outside of rehab.   Flowsheet Row PULMONARY REHAB OTHER RESPIRATORY from 09/14/2017 in [place]  Date 07/20/17  Educator Cloyde Reams  Instruction Review Code (Retired) 2- meets goals/outcomes       Pulmonary Medications:  -Engineer, maintenance (IT) group education provided by Art therapist with focus on inhaled medications and  proper administration. Flowsheet Row PULMONARY REHAB OTHER RESPIRATORY from 05/13/2021 in [place]  Date 04/08/21  Educator handout       Anatomy and Physiology of the Respiratory System and Intimacy:  -Group instruction provided by PowerPoint, verbal discussion, and written material to support subject matter. Instructor reviews respiratory cycle and anatomical components of the respiratory system and their functions. Instructor also reviews differences in obstructive and restrictive respiratory diseases with examples of each. Intimacy, Sex, and Sexuality  differences are reviewed with a discussion on how relationships can change when diagnosed with pulmonary disease. Common sexual concerns are reviewed. Flowsheet Row PULMONARY REHAB OTHER RESPIRATORY from 05/13/2021 in [place]  Date 04/15/21  Educator handout       MD DAY -A group question and answer session with a medical doctor that allows participants to ask questions that relate to their pulmonary disease state. Flowsheet Row PULMONARY REHAB OTHER RESPIRATORY from 09/14/2017 in [place]  Date 09/14/17  Educator Nelda Marseille  Instruction Review Code (Retired) R- Review/reinforce       OTHER EDUCATION -Group or individual verbal, written, or video instructions that support the educational goals of the pulmonary rehab program. [place] from 05/13/2021 in [place]  Date 05/06/21  Educator handout  [How to beat a sedentary lifestyle]  Instruction Review Code 1- [employer] Eating Survival Tips:  -Group instruction provided by PowerPoint slides, verbal discussion, and written materials to support subject matter. The instructor gives patients tips, tricks, and techniques to help them not only survive but enjoy the holidays despite the onslaught  of food that accompanies the holidays.   Knowledge Questionnaire Score:  Knowledge Questionnaire Score - 05/20/21 1521       Knowledge Questionnaire Score   Post Score 17/18             Core Components/Risk Factors/Patient Goals at Admission:  Personal Goals and Risk Factors at Admission - 03/15/21 0938       Core Components/Risk Factors/Patient Goals on Admission   Heart Failure Yes    Intervention Provide a combined exercise and nutrition program that is supplemented with education, support and counseling about heart failure. Directed toward relieving symptoms such as shortness of breath, decreased exercise tolerance, and extremity edema.    Expected Outcomes Improve functional capacity of life;Short term: Attendance in program 2-3 days a week with increased exercise capacity. Reported lower sodium intake. Reported increased fruit and vegetable intake. Reports medication compliance.;Short term: Daily weights obtained and reported for increase. Utilizing diuretic protocols set by physician.;Long term: Adoption of self-care skills and reduction of barriers for early signs and symptoms recognition and intervention leading to self-care maintenance.             Core Components/Risk Factors/Patient Goals Review:   Goals and Risk Factor Review     Row Name 03/22/21 1409 04/19/21 1434 05/18/21 0932         Core Components/Risk Factors/Patient Goals Review   Personal Goals Review Heart Failure Heart Failure Heart Failure     Review Jozlin will begin exercising in pulmonary rehab 03/23/2021. Heart failure is stable, she weighs herself daily and follows a low sodium diet and takes her medications correctly. Sahiba's heart failure is controlled, she weighs herself daily and follows a low sodium diet and takes her medications properly.  She is limited by her back pain and knee pain.  She is up to walking on the track with her POC and a rollator. She is exercicing @ 1.9 mets on the nustep.  She graduated in a week and will continue exercising @ [facility].     Expected Outcomes See admission goals. See admission goals. See admission goals              Core Components/Risk Factors/Patient Goals at Discharge (Final Review):   Goals and Risk Factor Review -  05/18/21 0932       Core Components/Risk Factors/Patient Goals Review   Personal Goals Review Heart Failure    Review Madeline's heart failure is controlled, she weighs herself daily and follows a low sodium diet and takes her medications properly.  She is limited by her back pain and knee pain.  She is up to walking on the track with her POC and a rollator. She is exercicing @ 1.9 mets on the nustep. She graduated in a week and will continue exercising @ [facility].    Expected Outcomes See admission goals             ITP Comments:   Comments: ITP REVIEW Pt is making expected progress toward pulmonary rehab goals after completing 17 sessions. Recommend continued exercise, life style modification, education, and utilization of breathing techniques to increase stamina and strength and decrease shortness of breath with exertion.

## 2021-05-25 ENCOUNTER — Encounter (HOSPITAL_COMMUNITY)
Admission: RE | Admit: 2021-05-25 | Discharge: 2021-05-25 | Disposition: A | Discharge status: Home / Self Care | Payer: PPO | Source: Ambulatory Visit | Attending: Cardiology | Admitting: Cardiology

## 2021-05-25 ENCOUNTER — Other Ambulatory Visit: Payer: Self-pay

## 2021-05-25 ENCOUNTER — Encounter (HOSPITAL_COMMUNITY): Payer: PPO | Admitting: Cardiology

## 2021-05-25 VITALS — Wt 254.6 lb

## 2021-05-25 DIAGNOSIS — H26493 Other secondary cataract, bilateral: Secondary | ICD-10-CM | POA: Diagnosis not present

## 2021-05-25 DIAGNOSIS — I5032 Chronic diastolic (congestive) heart failure: Secondary | ICD-10-CM

## 2021-05-25 DIAGNOSIS — H5000 Unspecified esotropia: Secondary | ICD-10-CM | POA: Diagnosis not present

## 2021-05-25 NOTE — Progress Notes (Signed)
Daily Session Note  Patient Details  Name: [name] MRN: [id number] Date of Birth: 10-17-1950 Referring Provider:   April [name] Pulmonary Rehab Walk Test from 03/15/2021 in [place]  Referring Provider [name]       Encounter Date: 05/25/2021  Check In:  Session Check In - 05/25/21 1414       Check-In   Supervising physician immediately available to respond to emergencies [facility] immediately available    Physician(s) Dr. Sarajane [name]    Location [facility]    Staff Present Stacy Poles, RN, Stacy Ore, MS, ACSM-CEP, Exercise Physiologist;Stacy Ysidro Evert, RN    Virtual Visit No    Medication changes reported     No    Fall or balance concerns reported    No    Tobacco Cessation No Change    Warm-up and Cool-down Not performed (comment)    Resistance Training Performed No    VAD Patient? No    PAD/SET Patient? No      Pain Assessment   Currently in Pain? No/denies    Multiple Pain Sites No             Capillary Blood Glucose: No results found for this or any previous visit (from the past 24 hour(s)).    Social History   Tobacco Use  Smoking Status Never  Smokeless Tobacco Never    Goals Met:  Proper associated with RPD/PD & O2 Sat Independence with exercise equipment Exercise tolerated well No report of concerns or symptoms today Strength training completed today  Goals Unmet:  Not Applicable  Comments: Service time is from 1250 to 1310. [name] completed her 6 MWT today.     Dr. Fransico [name] is Medical Director for Cardiac Rehab at [facility].

## 2021-05-27 ENCOUNTER — Ambulatory Visit (HOSPITAL_COMMUNITY)
Admission: RE | Admit: 2021-05-27 | Discharge: 2021-05-27 | Disposition: A | Discharge status: Home / Self Care | Payer: PPO | Source: Ambulatory Visit | Attending: Cardiology | Admitting: Cardiology

## 2021-05-27 ENCOUNTER — Encounter (HOSPITAL_COMMUNITY): Payer: Self-pay | Admitting: Cardiology

## 2021-05-27 ENCOUNTER — Other Ambulatory Visit: Payer: Self-pay

## 2021-05-27 VITALS — BP 108/70 | HR 62 | Wt 255.0 lb

## 2021-05-27 DIAGNOSIS — Z79890 Hormone replacement therapy (postmenopausal): Secondary | ICD-10-CM | POA: Diagnosis not present

## 2021-05-27 DIAGNOSIS — G4733 Obstructive sleep apnea (adult) (pediatric): Secondary | ICD-10-CM | POA: Insufficient documentation

## 2021-05-27 DIAGNOSIS — I110 Hypertensive heart disease with heart failure: Secondary | ICD-10-CM | POA: Insufficient documentation

## 2021-05-27 DIAGNOSIS — Z888 Allergy status to other drugs, medicaments and biological substances status: Secondary | ICD-10-CM | POA: Diagnosis not present

## 2021-05-27 DIAGNOSIS — I482 Chronic atrial fibrillation, unspecified: Secondary | ICD-10-CM | POA: Insufficient documentation

## 2021-05-27 DIAGNOSIS — Z6841 Body Mass Index (BMI) 40.0 and over, adult: Secondary | ICD-10-CM | POA: Diagnosis not present

## 2021-05-27 DIAGNOSIS — I2721 Secondary pulmonary arterial hypertension: Secondary | ICD-10-CM | POA: Insufficient documentation

## 2021-05-27 DIAGNOSIS — Z881 Allergy status to other antibiotic agents status: Secondary | ICD-10-CM | POA: Diagnosis not present

## 2021-05-27 DIAGNOSIS — Z8673 Personal history of transient ischemic attack (TIA), and cerebral infarction without residual deficits: Secondary | ICD-10-CM | POA: Insufficient documentation

## 2021-05-27 DIAGNOSIS — Z7901 Long term (current) use of anticoagulants: Secondary | ICD-10-CM | POA: Insufficient documentation

## 2021-05-27 DIAGNOSIS — Z79899 Other long term (current) drug therapy: Secondary | ICD-10-CM | POA: Insufficient documentation

## 2021-05-27 DIAGNOSIS — J9612 Chronic respiratory failure with hypercapnia: Secondary | ICD-10-CM | POA: Diagnosis not present

## 2021-05-27 DIAGNOSIS — I5032 Chronic diastolic (congestive) heart failure: Secondary | ICD-10-CM | POA: Diagnosis not present

## 2021-05-27 DIAGNOSIS — J9611 Chronic respiratory failure with hypoxia: Secondary | ICD-10-CM | POA: Diagnosis not present

## 2021-05-27 DIAGNOSIS — Z8249 Family history of ischemic heart disease and other diseases of the circulatory system: Secondary | ICD-10-CM | POA: Diagnosis not present

## 2021-05-27 DIAGNOSIS — Z886 Allergy status to analgesic agent status: Secondary | ICD-10-CM | POA: Insufficient documentation

## 2021-05-27 DIAGNOSIS — Z9981 Dependence on supplemental oxygen: Secondary | ICD-10-CM | POA: Diagnosis not present

## 2021-05-27 NOTE — Progress Notes (Signed)
Discharge Progress Report  Patient Details  Name: [name] MRN: [id number] Date of Birth: 1950-10-27 Referring Provider:   [name] [facility] Walk Test from 03/15/2021 in [place]  Referring Provider Dr.McLean        Number of Visits: 18  Reason for Discharge:  Patient reached a stable level of exercise. Patient independent in their exercise. Patient has met program and personal goals.  Smoking History:  Social History   Tobacco Use  Smoking Status Never  Smokeless Tobacco Never    Diagnosis:  Heart failure, diastolic, chronic ([place])  ADL UCSD:  Pulmonary Assessment Scores     Row Name 03/15/21 1033 05/20/21 1522 05/25/21 1508     ADL UCSD   ADL Phase Entry Exit --   SOB Score total 18 13 --     CAT Score   CAT Score 6 3 --     mMRC Score   mMRC Score 3 -- 2            Initial Exercise Prescription:  Initial Exercise Prescription - 03/16/21 0800       Date of Initial Exercise RX and Referring Provider   Date 03/15/21    Referring Provider Dr.McLean    Expected Discharge Date 05/20/21      NuStep   Level 1    SPM 70    Minutes 30      Prescription Details   Frequency (times per week) 2    Duration Progress to 30 minutes of continuous aerobic without signs/symptoms of physical distress      Intensity   THRR 40-80% of Max Heartrate 60-119    Ratings of Perceived Exertion 11-13    Perceived Dyspnea 0-4      Progression   Progression Continue to progress workloads to maintain intensity without signs/symptoms of physical distress.      Resistance Training   Training Prescription Yes    Weight Red bands    Reps 10-15             Discharge Exercise Prescription (Final Exercise Prescription Changes):  Exercise Prescription Changes - 05/11/21 1400       Response to Exercise   Blood Pressure (Admit) 110/60    Blood Pressure (Exercise) 112/60    Blood Pressure (Exit) 100/60     Heart Rate (Admit) 69 bpm    Heart Rate (Exercise) 99 bpm    Heart Rate (Exit) 69 bpm    Oxygen Saturation (Admit) 95 %    Oxygen Saturation (Exercise) 89 %    Oxygen Saturation (Exit) 92 %    Rating of Perceived Exertion (Exercise) 15    Perceived Dyspnea (Exercise) 1    Duration Continue with 30 min of aerobic exercise without signs/symptoms of physical distress.    Intensity THRR unchanged      Progression   Progression Continue to progress workloads to maintain intensity without signs/symptoms of physical distress.      Resistance Training   Training Prescription Yes    Weight Red bands    Reps 10-15    Time 10 Minutes      NuStep   Level 5    SPM 80    Minutes 15    METs 2.1      Track   Laps 7    Minutes 15    METs 1.81      Home Exercise Plan   Plans to continue exercise at [facility] (comment)  and home   Frequency Add 1 additional day to program exercise sessions.    Initial Home Exercises Provided 05/11/21             Functional Capacity:  6 Minute Walk     Row Name 03/15/21 1024 03/15/21 1030 05/25/21 1450     6 Minute Walk   Phase Initial -- Discharge   Distance 630 feet -- 874 feet   Distance % Change -- -- 38.73 %   Distance Feet Change -- -- 244 ft   Walk Time 6 minutes -- 6 minutes   # of Rest Breaks 2 -- 0   MPH 1.19 -- 1.66   METS 0.92 -- 1.13   RPE 17 -- 13   Perceived Dyspnea  3 -- 1   VO2 Peak 3.23 -- 3.94   Symptoms Yes (comment) -- No   Comments took 2 standing rest breaks due to shortness of breath and leaned on the hand rests of her rolling walker to take pressure off her back. -- --   Resting HR 63 bpm -- 65 bpm   Resting BP 162/75 -- 116/66   Resting Oxygen Saturation  98 % -- 95 %   Exercise Oxygen Saturation  during 6 min walk 88 % -- 89 %   Max Ex. HR 106 bpm -- 111 bpm   Max Ex. BP 175/77 -- 138/70   2 Minute Post BP 159/84 -- 126/68     Interval HR   1 Minute HR -- 77 97   2 Minute HR -- 88 92   3 Minute  HR -- 100 111   4 Minute HR -- 106 102   5 Minute HR -- 99 109   6 Minute HR -- 101 113   2 Minute Post HR -- 74 70   Interval Heart Rate? -- Yes Yes     Interval Oxygen   Interval Oxygen? Yes -- Yes  Used her POC   Baseline Oxygen Saturation % 98 % -- 95 %  2L   1 Minute Oxygen Saturation % 97 % -- 97 %   1 Minute Liters of Oxygen 0 L -- 2 L   2 Minute Oxygen Saturation % 93 % -- 93 %   2 Minute Liters of Oxygen 0 L -- 2 L   3 Minute Oxygen Saturation % 88 % -- 93 %   3 Minute Liters of Oxygen 0 L -- 2 L   4 Minute Oxygen Saturation % 89 % -- 93 %   4 Minute Liters of Oxygen 0 L -- 2 L   5 Minute Oxygen Saturation % 90 % -- 89 %   5 Minute Liters of Oxygen 0 L -- 2 L   6 Minute Oxygen Saturation % 89 % -- 90 %   6 Minute Liters of Oxygen 0 L -- 2 L   2 Minute Post Oxygen Saturation % 92 % -- 97 %   2 Minute Post Liters of Oxygen 0 L -- 2 L            Psychological, QOL, Others - Outcomes: PHQ 2/9: Depression screen [facility] 2/9 05/25/2021 04/22/2021 04/22/2021 03/15/2021 04/21/2020  Decreased Interest 0 0 0 0 0  Down, Depressed, Hopeless 0 0 0 0 0  PHQ - 2 Score 0 0 0 0 0  Altered sleeping 0 - - 0 0  Tired, decreased energy 0 - - 3 0  Change in appetite 0 - - 0 0  Feeling bad or failure about yourself  0 - - 0 0  Trouble concentrating 0 - - 0 0  Moving slowly or fidgety/restless 0 - - 0 0  Suicidal thoughts 0 - - 0 0  PHQ-9 Score 0 - - - 0  Difficult doing work/chores Not difficult at all - - Not difficult at all Not difficult at all  Some recent data might be hidden    Quality of Life:   Personal Goals: Goals established at orientation with interventions provided to work toward goal.  Personal Goals and Risk Factors at Admission - 03/15/21 0938       Core Components/Risk Factors/Patient Goals on Admission   Heart Failure Yes    Intervention Provide a combined exercise and nutrition program that is supplemented with education, support and counseling about heart  failure. Directed toward relieving symptoms such as shortness of breath, decreased exercise tolerance, and extremity edema.    Expected Outcomes Improve functional capacity of life;Short term: Attendance in program 2-3 days a week with increased exercise capacity. Reported lower sodium intake. Reported increased fruit and vegetable intake. Reports medication compliance.;Short term: Daily weights obtained and reported for increase. Utilizing diuretic protocols set by physician.;Long term: Adoption of self-care skills and reduction of barriers for early signs and symptoms recognition and intervention leading to self-care maintenance.              Personal Goals Discharge:  Goals and Risk Factor Review     Row Name 03/22/21 1409 04/19/21 1434 05/18/21 0932         Core Components/Risk Factors/Patient Goals Review   Personal Goals Review Heart Failure Heart Failure Heart Failure     Review Libbey will begin exercising in pulmonary rehab 03/23/2021. Heart failure is stable, she weighs herself daily and follows a low sodium diet and takes her medications correctly. Alvis's heart failure is controlled, she weighs herself daily and follows a low sodium diet and takes her medications properly.  She is limited by her back pain and knee pain.  She is up to walking on the track with her POC and a rollator. She is exercicing @ 1.9 mets on the nustep. She graduated in a week and will continue exercising @ [facility].     Expected Outcomes See admission goals. See admission goals. See admission goals              Exercise Goals and Review:  Exercise Goals     Row Name 03/16/21 0850 04/20/21 1136 05/17/21 0656         Exercise Goals   Increase Physical Activity Yes Yes Yes     Intervention Provide advice, education, support and counseling about physical activity/exercise needs.;Develop an individualized exercise prescription for aerobic and resistive training based on initial evaluation  findings, risk stratification, comorbidities and participant's personal goals. Provide advice, education, support and counseling about physical activity/exercise needs.;Develop an individualized exercise prescription for aerobic and resistive training based on initial evaluation findings, risk stratification, comorbidities and participant's personal goals. Provide advice, education, support and counseling about physical activity/exercise needs.;Develop an individualized exercise prescription for aerobic and resistive training based on initial evaluation findings, risk stratification, comorbidities and participant's personal goals.     Expected Outcomes Short Term: Attend rehab on a regular basis to increase amount of physical activity.;Long Term: Add in home exercise to make exercise part of routine and to increase amount of physical activity.;Long Term: Exercising regularly at least 3-5 days a week. Short Term: Attend  rehab on a regular basis to increase amount of physical activity.;Long Term: Add in home exercise to make exercise part of routine and to increase amount of physical activity.;Long Term: Exercising regularly at least 3-5 days a week. Short Term: Attend rehab on a regular basis to increase amount of physical activity.;Long Term: Add in home exercise to make exercise part of routine and to increase amount of physical activity.;Long Term: Exercising regularly at least 3-5 days a week.     Increase Strength and Stamina Yes Yes Yes     Intervention Provide advice, education, support and counseling about physical activity/exercise needs.;Develop an individualized exercise prescription for aerobic and resistive training based on initial evaluation findings, risk stratification, comorbidities and participant's personal goals. Provide advice, education, support and counseling about physical activity/exercise needs.;Develop an individualized exercise prescription for aerobic and resistive training based on  initial evaluation findings, risk stratification, comorbidities and participant's personal goals. Provide advice, education, support and counseling about physical activity/exercise needs.;Develop an individualized exercise prescription for aerobic and resistive training based on initial evaluation findings, risk stratification, comorbidities and participant's personal goals.     Expected Outcomes Short Term: Increase workloads from initial exercise prescription for resistance, speed, and METs.;Short Term: Perform resistance training exercises routinely during rehab and add in resistance training at home;Long Term: Improve cardiorespiratory fitness, muscular endurance and strength as measured by increased METs and functional capacity (6MWT) Short Term: Increase workloads from initial exercise prescription for resistance, speed, and METs.;Short Term: Perform resistance training exercises routinely during rehab and add in resistance training at home;Long Term: Improve cardiorespiratory fitness, muscular endurance and strength as measured by increased METs and functional capacity (6MWT) Short Term: Increase workloads from initial exercise prescription for resistance, speed, and METs.;Short Term: Perform resistance training exercises routinely during rehab and add in resistance training at home;Long Term: Improve cardiorespiratory fitness, muscular endurance and strength as measured by increased METs and functional capacity (6MWT)     Able to understand and use rate of perceived exertion (RPE) scale Yes Yes Yes     Intervention Provide education and explanation on how to use RPE scale Provide education and explanation on how to use RPE scale Provide education and explanation on how to use RPE scale     Expected Outcomes Short Term: Able to use RPE daily in rehab to express subjective intensity level;Long Term:  Able to use RPE to guide intensity level when exercising independently Short Term: Able to use RPE daily in  rehab to express subjective intensity level;Long Term:  Able to use RPE to guide intensity level when exercising independently Short Term: Able to use RPE daily in rehab to express subjective intensity level;Long Term:  Able to use RPE to guide intensity level when exercising independently     Able to understand and use Dyspnea scale Yes Yes Yes     Intervention Provide education and explanation on how to use Dyspnea scale Provide education and explanation on how to use Dyspnea scale Provide education and explanation on how to use Dyspnea scale     Expected Outcomes Short Term: Able to use Dyspnea scale daily in rehab to express subjective sense of shortness of breath during exertion;Long Term: Able to use Dyspnea scale to guide intensity level when exercising independently Short Term: Able to use Dyspnea scale daily in rehab to express subjective sense of shortness of breath during exertion;Long Term: Able to use Dyspnea scale to guide intensity level when exercising independently Short Term: Able to use Dyspnea scale daily  in rehab to express subjective sense of shortness of breath during exertion;Long Term: Able to use Dyspnea scale to guide intensity level when exercising independently     Knowledge and understanding of Target Heart Rate Range (THRR) Yes Yes Yes     Intervention Provide education and explanation of THRR including how the numbers were predicted and where they are located for reference Provide education and explanation of THRR including how the numbers were predicted and where they are located for reference Provide education and explanation of THRR including how the numbers were predicted and where they are located for reference     Expected Outcomes Short Term: Able to state/look up THRR;Long Term: Able to use THRR to govern intensity when exercising independently;Short Term: Able to use daily as guideline for intensity in rehab Short Term: Able to state/look up THRR;Long Term: Able to use  THRR to govern intensity when exercising independently;Short Term: Able to use daily as guideline for intensity in rehab Short Term: Able to state/look up THRR;Long Term: Able to use THRR to govern intensity when exercising independently;Short Term: Able to use daily as guideline for intensity in rehab     Understanding of Exercise Prescription Yes Yes Yes     Intervention Provide education, explanation, and written materials on patient's individual exercise prescription Provide education, explanation, and written materials on patient's individual exercise prescription Provide education, explanation, and written materials on patient's individual exercise prescription     Expected Outcomes Short Term: Able to explain program exercise prescription;Long Term: Able to explain home exercise prescription to exercise independently Short Term: Able to explain program exercise prescription;Long Term: Able to explain home exercise prescription to exercise independently Short Term: Able to explain program exercise prescription;Long Term: Able to explain home exercise prescription to exercise independently              Exercise Goals Re-Evaluation:  Exercise Goals Re-Evaluation     Row Name 03/22/21 1508 04/20/21 1136 05/17/21 0657         Exercise Goal Re-Evaluation   Exercise Goals Review Increase Physical Activity;Increase Strength and Stamina;Able to understand and use rate of perceived exertion (RPE) scale;Able to understand and use Dyspnea scale;Knowledge and understanding of Target Heart Rate Range (THRR);Understanding of Exercise Prescription Increase Physical Activity;Increase Strength and Stamina;Able to understand and use rate of perceived exertion (RPE) scale;Able to understand and use Dyspnea scale;Knowledge and understanding of Target Heart Rate Range (THRR);Understanding of Exercise Prescription Increase Physical Activity;Increase Strength and Stamina;Able to understand and use rate of  perceived exertion (RPE) scale;Able to understand and use Dyspnea scale;Knowledge and understanding of Target Heart Rate Range (THRR);Understanding of Exercise Prescription     Comments Patient is scheduled to start exercise this week. We will monitor and progress as she is able. Rashi has completed 7 exercise sessions. She is deconditioned as she uses the Nustep the whole 30 minutes. She has increased her workload since starting exercise. Sharda has averaged 2.0 METs on the Nustep. She has arthritis which prevents her from doing the warm up and resistance exercises/ cool down standing up. Will continue to monitor and progress as she is able. Merita has completed 15 exercise sessions. She is slowly progressing on the Nustep and has started walking the track. She averages 2.1 METs on the Nustep at level 5 and 1.81 METs on the track. Tikia has to take breaks on the track due to her SOB. She has brought in her POC to exercise on at 2L. She is motivated to  exercise and progress. Will continue to monitor and progress as able.     Expected Outcomes Through exercise at rehab and home, the patient will decrease shortness of breath with daily activities and feel confident in carrying out an exercise regimn at home. Through exercise at rehab and home, the patient will decrease shortness of breath with daily activities and feel confident in carrying out an exercise regimn at home. Through exercise at rehab and home, the patient will decrease shortness of breath with daily activities and feel confident in carrying out an exercise regimn at home.              Nutrition & Weight - Outcomes:  Pre Biometrics - 03/15/21 0929       Pre Biometrics   Height 5' 0.5" (1.537 m)    Weight 116.4 kg    BMI (Calculated) 49.27    Grip Strength 16.5 kg             Post Biometrics - 05/25/21 1458        Post  Biometrics   Weight 115.5 kg    BMI (Calculated) 48.89    Grip Strength 19 kg              Nutrition:  Nutrition Therapy & Goals - 03/30/21 1449       Nutrition Therapy   Diet TLC      Personal Nutrition Goals   Nutrition Goal Pt to identify food quantities necessary to achieve weight loss of 6-24 lb at graduation from cardiac rehab.    Personal Goal #2 Pt to build a healthy plate including vegetables, fruits, whole grains, and low-fat dairy products in a heart healthy meal plan.      Intervention Plan   Intervention Prescribe, educate and counsel regarding individualized specific dietary modifications aiming towards targeted core components such as weight, hypertension, lipid management, diabetes, heart failure and other comorbidities.;Nutrition handout(s) given to patient.    Expected Outcomes Short Term Goal: Understand basic principles of dietary content, such as calories, fat, sodium, cholesterol and nutrients.             Nutrition Discharge:   Education Questionnaire Score:  Knowledge Questionnaire Score - 05/20/21 1521       Knowledge Questionnaire Score   Post Score 17/18             Goals reviewed with patient; copy given to patient.

## 2021-05-27 NOTE — Patient Instructions (Addendum)
EKG done today.  No Labs done today.   No medication changes were made. Please continue all current medications as prescribed.  Your physician recommends that you schedule a follow-up appointment in: 3 months with an echo prior to your exam.  If you have any questions or concerns before your next appointment please send [place] a message through [place] or call our office at [phone].    TO LEAVE A MESSAGE FOR THE NURSE SELECT OPTION 2, PLEASE LEAVE A MESSAGE INCLUDING: YOUR NAME DATE OF BIRTH CALL BACK NUMBER REASON FOR CALL**this is important as we prioritize the call backs  YOU WILL RECEIVE A CALL BACK THE SAME DAY AS LONG AS YOU CALL BEFORE 4:00 PM   Do the following things EVERYDAY: Weigh yourself in the morning before breakfast. Write it down and keep it in a log. Take your medicines as prescribed Eat low salt foods--Limit salt (sodium) to 2000 mg per day.  Stay as active as you can everyday Limit all fluids for the day to less than 2 liters   At the [facility], you and your health needs are our priority. As part of our continuing mission to provide you with exceptional heart care, we have created designated Provider Care Teams. These Care Teams include your primary Cardiologist (physician) and Advanced Practice Providers (APPs- Physician Assistants and Nurse Practitioners) who all work together to provide you with the care you need, when you need it.   You may see any of the following providers on your designated Care Team at your next follow up: Dr Glori Bickers Dr Haynes Kerns, NP Lyda Jester, [place] Audry Riles, PharmD   Please be sure to bring in all your medications bottles to every appointment.

## 2021-05-29 NOTE — Progress Notes (Signed)
Date:  05/29/2021   ID:  Stacy, [name] 01/10/1951, MRN [id number]   Provider location: [facility] Type of Visit: Established patient   PCP:  [name], MD  Cardiologist:  [name]  History of Present Illness: [name] is a 70 y.o. female with PMH of pulmonary HTN, morbid obesity, chronic afib (on Xarelto, CVA in 2012, OHS/OSA (Had U PE3 surgery, and chronic respiratory failure with hypoxemia on continuous 02 at 2 lpm via [place]).   Admitted 3/2 ->8/00/12 with A/C diastolic CHF and A/C respiratory failure. Pt initially refused Bipap so venti mask used. Eventually tolerated transition to BiPAP. Overall pt diuresed from 285 lbs down to 229 lbs with 38 L of diuresis.  Echo 11/07/16 EF 55-60%, PASP 58m Hg, mildly dilated RV with moderate to severely decreased RV systolic function. RHC/LHC in 3/18 showed no coronary disease and confirmed severe PH.    Echo in 7/20 showed EF 60-65%, mild LVH, normal RV, PASP 38 mmHg, mild AS. Echo in 8/21 showed EF 60-65%, mildly decreased RV function, severe biatrial enlargement, small PFO, unable to estimate PA systolic pressure.    She returns today for followup of CHF.  She has been using Bipap nightly and 2 L oxygen with exertion.  She has not been taking pm Lasix.  Weight is stable. She walks for 15 minutes at the gym, breathing better overall. She is short of breath walking longer distances.  No orthopnea/PND.  No chest pain.  She has finished pulmonary rehab and thinks it helped.  No BRBPR/melena.    Labs (1/19): K 3.7, creatinine 1.37 Labs (11/19): K 3.9, creatinine 1.2, LDL 67, hgb 13.2 Labs (1/20): K 3.5, creatinine 1.34 Labs (5/20): K 3.9, creatinine 1.18, LDL 73 Labs (6/21): K 4.7, creatinine 1.19, LDL 71, hgb 13.7 Labs (11/21): K 4, creatinine 1.17, LDL 71 Labs (4/22): K 4.3, creatinine 1.34, LDL 59 Labs (9/22): K 3.9, creatinine 1.34  ECG (personally reviewed): atrial fibrillation with  right axis deviation.    PMH 1. Chronic diastolic CHF with severe pulmonary hypertension and prominent RV failure:  Echo (3/18) with EF 55-60%, PASP 952mHg, mildly dilated RV with moderate to severely decreased RV systolic function. - RHC/LHC 11/07/16: No angiographic CAD; PA 90/55, LVEDP 23 (PCWP inaccurate), CI 2.15 Fick/2.32 Thermo, shunt run negative, PVR 5.7 WU.  - Echo (7/20): EF 60-65%, mild LVH, normal RV, PASP 38 mmHg, mild AS.  - Echo (8/21): EF 60-65%, mildly decreased RV function, severe biatrial enlargement, small PFO, unable to estimate PA systolic pressure. 2. Chronic hypercarbic/hypoxic respiratory failure with OHS/OSA. Sees Dr. McLake BellsUsing nightly BiPAP and oxygen by nasal cannula during the day.  3. Atrial fibrillation: Chronic - Rate controlled on Xarelto + atenolol  4. 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  PVR 5.7 WU on RHC 3/18.  Suspect group 2 (elevated LA pressure) and group 3 (OHS/OSA) PH. However, cannot rule out group 1 component.  She had V/Q scan that was not suggestive of chronic PE and high resolution chest CT that was not suggestive of ILD.  ANA, RF, anti-SCL70 all negative.   5. H/o CVA 6. Hypothyroidism 7. H/o TKR 8. Aortic stenosis: Mild on 7/20 echo.   Current Outpatient Medications  Medication Sig Dispense Refill   acetaZOLAMIDE (DIAMOX) 250 MG tablet TAKE 1 TABLET BY MOUTH EVERY DAY 90 tablet 0   ADCIRCA 20 MG tablet TAKE 2 TABLETS BY MOUTH ONCE  DAILY. 60 tablet 4   allopurinol (ZYLOPRIM) 100 MG tablet TAKE 1 TABLET BY MOUTH TWICE A DAY 180 tablet 1   atenolol (TENORMIN) 50 MG tablet TAKE 1 TABLET (50 MG TOTAL) BY MOUTH DAILY. MUST BE SEEN FOR FURTHER REFILLS 90 tablet 0   Cholecalciferol (VITAMIN D) 125 MCG (5000 UT) CAPS Take 5,000 Units by mouth 3 (three) times a week.      Cyanocobalamin 5000 MCG TBDP Take 5,000 mcg by mouth 3 (three) times a week.      diclofenac sodium (VOLTAREN) 1 % GEL Apply 2 g  topically 2 (two) times daily as needed (for arthritis).     empagliflozin (JARDIANCE) 10 MG TABS tablet Take 1 tablet (10 mg total) by mouth daily. 30 tablet 11   Ferrous Sulfate (SLOW FE PO) Take 1 tablet by mouth 3 (three) times a week.     furosemide (LASIX) 20 MG tablet Take 40 mg by mouth daily. 20 mg in the evening     levothyroxine (SYNTHROID) 75 MCG tablet TAKE 1 TABLET BY MOUTH EVERY DAY 90 tablet 0   potassium chloride (KLOR-CON) 10 MEQ tablet TAKE 2 TABLETS BY MOUTH TWICE A DAY 120 tablet 0   rosuvastatin (CRESTOR) 5 MG tablet TAKE 1 TABLET (5 MG TOTAL) BY MOUTH DAILY. MUST BE SEEN IN OFFICE FOR FURTHER REFILLS 30 tablet 1   traMADol (ULTRAM) 50 MG tablet Take 1 tablet (50 mg total) by mouth 3 (three) times daily as needed. 60 tablet 1   XARELTO 20 MG TABS tablet TAKE 1 TABLET (20 MG TOTAL) BY MOUTH DAILY WITH SUPPER. MUST BE SEEN FOR FURTHER REFILLS 30 tablet 1   No current facility-administered medications for this encounter.    Allergies:   Ambien [zolpidem tartrate], Losartan, Prevacid [lansoprazole], Adhesive [tape], Keflex [cephalexin], Motrin [ibuprofen], Prilosec [omeprazole], Ace inhibitors, Benadryl [diphenhydramine hcl], and Spironolactone   Social History:  The patient  reports that she has never smoked. She has never used smokeless tobacco. She reports current alcohol use of about 1.0 standard drink per week. She reports that she does not use drugs.   Family History:  The patient's family history includes COPD in her mother; Diabetes in her father; Heart attack in her father; Hypertension in her brother, father, mother, and sister; Liver cancer in her father; Osteoporosis in her mother; Stroke in her maternal grandmother.   ROS:  Please see the history of present illness.   All other systems are personally reviewed and negative.   Exam:   BP 108/70   Pulse 62   Wt 115.7 kg (255 lb)   LMP 05/06/2013   SpO2 96%   BMI 48.98 kg/m  General: NAD, obese.  Neck: No JVD,  no thyromegaly or thyroid nodule.  Lungs: Clear to auscultation bilaterally with normal respiratory effort. CV: Nondisplaced PMI.  Heart irregular S1/S2, no S3/S4, no murmur.  No peripheral edema.  No carotid bruit.  Normal pedal pulses.  Abdomen: Soft, nontender, no hepatosplenomegaly, no distention.  Skin: Intact without lesions or rashes.  Neurologic: Alert and oriented x 3.  Psych: Normal affect. Extremities: No clubbing or cyanosis.  HEENT: Normal.   Recent Labs: 12/14/2020: TSH 1.69 05/19/2021: ALT 8; BUN 25; Creatinine, Ser 1.34; Hemoglobin 14.3; Platelets 133.0; Potassium 3.9; Sodium 144  Personally reviewed   Wt Readings from Last 3 Encounters:  05/27/21 115.7 kg (255 lb)  05/25/21 115.5 kg (254 lb 10.1 oz)  05/19/21 114.3 kg (252 lb)    ASSESSMENT AND PLAN:  1. Chronic diastolic CHF:  Associated with severe pulmonary hypertension and prominent RV failure.  NYHA class II symptoms, unchanged.  Most recent echo in 8/21 showed showed LV EF 60-65%, mildly decreased RV function. On exam, she is not significantly volume overloaded.   - Take Lasix as ordered, 40 qam/20 qpm.  Recent BMET stable.  - Continue diamox 250 mg daily.  - Continue Jardiance 10 mg daily.  - Repeat echo at followup in 3 months.  2. Chronic hypercarbic/hypoxic respiratory failure with OHS/OSA:  Using bipap at night and oxygen during the day with exertion.   Pulmonary rehab helped.  - Compliant with Bipap nightly.  - Continue regular exercise and efforts at weight loss. - Continue pulmonary followup.  3. Atrial fibrillation: Chronic, rate controlled  - Continue atenolol 50 mg daily.    - Continue Xarelto for anticoagulation.   4. 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  PVR 5.7 WU by [place] in 3/18.  Suspect group 2 (elevated LA pressure) and group 3 (OHS/OSA) PH. However, cannot rule out group 1 component.  She had V/Q scan that was not suggestive of chronic PE and high  resolution chest CT that was not suggestive of ILD.  ANA, RF, anti-SCL70 all negative.  She did not have a marked improvement from taking Adcirca => suspect predominantly group 2 and 3 PH.  Will hold off on additional pulmonary vasodilators. Echo in 8/21 with mildly decreased RV function, unable to estimate PA systolic pressure.  - Continue Adcirca 40 mg daily.  - Continue BiPAP at night for OSA and oxygen with exertion.  5. HTN: BP controlled.    Followup 3 months with echo.   Signed, Loralie Champagne, MD  05/29/2021  [facility] [address] [facility] [place] [address] [phone] (office) [phone] (fax)

## 2021-05-31 ENCOUNTER — Other Ambulatory Visit: Payer: Self-pay

## 2021-05-31 ENCOUNTER — Ambulatory Visit: Payer: PPO | Admitting: Adult Health

## 2021-05-31 ENCOUNTER — Encounter: Payer: Self-pay | Admitting: Adult Health

## 2021-05-31 VITALS — BP 128/70 | HR 83 | Temp 98.2°F | Ht 60.0 in | Wt 253.8 lb

## 2021-05-31 DIAGNOSIS — I272 Pulmonary hypertension, unspecified: Secondary | ICD-10-CM | POA: Diagnosis not present

## 2021-05-31 DIAGNOSIS — J9611 Chronic respiratory failure with hypoxia: Secondary | ICD-10-CM

## 2021-05-31 DIAGNOSIS — G4733 Obstructive sleep apnea (adult) (pediatric): Secondary | ICD-10-CM | POA: Diagnosis not present

## 2021-05-31 DIAGNOSIS — I5032 Chronic diastolic (congestive) heart failure: Secondary | ICD-10-CM | POA: Diagnosis not present
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
## 2021-05-31 NOTE — Assessment & Plan Note (Signed)
Appears stable continue on current regimen 

## 2021-05-31 NOTE — Assessment & Plan Note (Signed)
Continue on oxygen 2 L with activity to maintain O2 saturations greater than 90%

## 2021-05-31 NOTE — Patient Instructions (Addendum)
Continue on CPAP  At bedtime  Continue on Oxygen 2l/m with activity As needed  , goal is for Oxygen level >90%  Work on healthy weight  Do not drive if sleepy.  Continue with follow up with Cardiology as planned.  Continue on Furosemide.  Low salt diet.  Continue on Adcirca  Follow up with Dr Hermina Staggers Royal Piedra NP in 6 months.  Please contact office for sooner follow up if symptoms do not improve or worsen or seek emergency care

## 2021-05-31 NOTE — Assessment & Plan Note (Signed)
Excellent control and compliance on nocturnal CPAP.  Plan  Patient Instructions  Continue on CPAP  At bedtime  Continue on Oxygen 2l/m with activity As needed  , goal is for Oxygen level >90%  Work on healthy weight  Do not drive if sleepy.  Continue with follow up with Cardiology as planned.  Continue on Furosemide.  Low salt diet.  Continue on Adcirca  Follow up with Dr Hermina Staggers Royal Piedra NP in 6 months.  Please contact office for sooner follow up if symptoms do not improve or worsen or seek emergency care

## 2021-05-31 NOTE — Assessment & Plan Note (Addendum)
Mixed Group II/III -appears stable  Continue on Follow up with cardiology and current regimen

## 2021-06-04 ENCOUNTER — Other Ambulatory Visit (HOSPITAL_COMMUNITY): Payer: Self-pay | Admitting: Cardiology

## 2021-06-12 ENCOUNTER — Other Ambulatory Visit (HOSPITAL_COMMUNITY): Payer: Self-pay | Admitting: Cardiology

## 2021-06-17 ENCOUNTER — Other Ambulatory Visit (HOSPITAL_COMMUNITY): Payer: Self-pay | Admitting: Cardiology

## 2021-06-28 ENCOUNTER — Telehealth (HOSPITAL_COMMUNITY): Payer: Self-pay | Admitting: Pharmacy Technician

## 2021-06-28 NOTE — Telephone Encounter (Signed)
Advanced Heart Failure Patient Advocate Encounter  Spoke with patient regarding Jardiance assistance renewal. She is going to mail a copy of her portion to the office. Will fax in once I obtain the prescriber's signature. Patient requested a copy to be mailed back to her as well.  Of note, went ahead and spoke with patient regarding Xarelto assistance for next year. She is aware of the 4% OOP that has to be met first, she will likely not be able to meet this requirement. She gets help with Jardiance and Adcirca, the two other expensive medications that she is on. Advised her to let us know if she needs samples. Will provide if available.

## 2021-07-01 ENCOUNTER — Telehealth: Payer: PPO

## 2021-07-14 ENCOUNTER — Telehealth: Payer: Self-pay | Admitting: Pharmacist

## 2021-07-14 NOTE — Chronic Care Management (AMB) (Signed)
Chronic Care Management Pharmacy Assistant   Name: [name]  MRN: [id number] DOB: 1950-10-10  Reason for Encounter: Disease State / Hypertension Assessment Call   Conditions to be addressed/monitored: HTN   Recent office visits:  05/19/2021 Stacy Ace MD (PCP) - Patient was seen for medication management and additional issues. Discontinued Cipro. Follow up in 4-6 months.   Recent consult visits:  05/31/2021 Stacy Edison NP (pulmonary disease) - Patient was seen for obstructive sleep apnea and additional issues. No medication changes. Follow up in 6 months.   Hospital visits:  none  Medications: Outpatient Encounter Medications as of 07/14/2021  Medication Sig   acetaZOLAMIDE (DIAMOX) 250 MG tablet TAKE 1 TABLET BY MOUTH EVERY DAY   ADCIRCA 20 MG tablet TAKE 2 TABLETS BY MOUTH ONCE DAILY.   allopurinol (ZYLOPRIM) 100 MG tablet TAKE 1 TABLET BY MOUTH TWICE A DAY   atenolol (TENORMIN) 50 MG tablet TAKE 1 TABLET (50 MG TOTAL) BY MOUTH DAILY. MUST BE SEEN FOR FURTHER REFILLS   Cholecalciferol (VITAMIN D) 125 MCG (5000 UT) CAPS Take 5,000 Units by mouth 3 (three) times a week.    Cyanocobalamin 5000 MCG TBDP Take 5,000 mcg by mouth 3 (three) times a week.    diclofenac sodium (VOLTAREN) 1 % GEL Apply 2 g topically 2 (two) times daily as needed (for arthritis).   empagliflozin (JARDIANCE) 10 MG TABS tablet Take 1 tablet (10 mg total) by mouth daily.   Ferrous Sulfate (SLOW FE PO) Take 1 tablet by mouth 3 (three) times a week.   furosemide (LASIX) 20 MG tablet TAKE 2 TABLETS BY MOUTH TWICE A DAY   levothyroxine (SYNTHROID) 75 MCG tablet TAKE 1 TABLET BY MOUTH EVERY DAY   potassium chloride (KLOR-CON) 10 MEQ tablet Take 2 tablets (20 mEq total) by mouth 2 (two) times daily.   rosuvastatin (CRESTOR) 5 MG tablet Take 1 tablet (5 mg total) by mouth daily.   traMADol (ULTRAM) 50 MG tablet Take 1 tablet (50 mg total) by mouth 3 (three) times daily as needed.   XARELTO 20  MG TABS tablet TAKE 1 TABLET (20 MG TOTAL) BY MOUTH DAILY WITH SUPPER. MUST BE SEEN FOR FURTHER REFILLS   [DISCONTINUED] TOPAMAX 50 MG tablet Take 50 mg by mouth.   No facility-administered encounter medications on file as of 07/14/2021.   Fill History:  ALLOPURINOL 100 MG TABLET 05/02/2021 90   ATENOLOL 50 MG TABLET 04/20/2021 90   FUROSEMIDE 20 MG TABLET 06/04/2021 90   LEVOTHYROXINE 75 MCG TABLET 04/20/2021 90   POTASSIUM CL ER 10 MEQ TABLET 06/17/2021 90   XARELTO 20 MG TABLET 05/18/2021 90   ROSUVASTATIN CALCIUM 5 MG TAB 06/16/2021 90   ACETAZOLAMIDE 250MG TABLET 06/22/2021 90   Reviewed chart prior to disease state call. Spoke with patient regarding BP  Recent Office Vitals: BP Readings from Last 3 Encounters:  05/31/21 128/70  05/27/21 108/70  05/19/21 130/76   Pulse Readings from Last 3 Encounters:  05/31/21 83  05/27/21 62  05/19/21 60    Wt Readings from Last 3 Encounters:  05/31/21 253 lb 12.8 oz (115.1 kg)  05/27/21 255 lb (115.7 kg)  05/25/21 254 lb 10.1 oz (115.5 kg)     Kidney Function Lab Results  Component Value Date/Time   CREATININE 1.34 (H) 05/19/2021 03:18 PM   CREATININE 1.20 01/01/2021 10:32 AM   CREATININE 1.01 (H) 07/05/2016 10:13 AM   CREATININE 0.90 09/29/2015 02:37 PM   GFR 39.83 (L) 05/19/2021 03:18  PM   GFRNONAA 50 (L) 07/14/2020 03:32 AM   GFRNONAA 58 (L) 07/05/2016 10:13 AM   GFRAA 47 (L) 09/19/2018 09:30 AM   GFRAA 67 07/05/2016 10:13 AM    BMP Latest Ref Rng & Units 05/19/2021 01/01/2021 12/14/2020  Glucose 70 - 99 mg/dL 89 97 103(H)  BUN 6 - 23 mg/dL 25(H) 17 24(H)  Creatinine 0.40 - 1.20 mg/dL 1.34(H) 1.20 1.34(H)  Sodium 135 - 145 mEq/L 144 142 145  Potassium 3.5 - 5.1 mEq/L 3.9 4.0 4.3  Chloride 96 - 112 mEq/L 109 108 111  CO2 19 - 32 mEq/L _0 Calcium 8.4 - 10.5 mg/dL 9.1 9.4 9.0    Current antihypertensive regimen:  Atenolol 50 mg daily  How often are you checking your Blood Pressure? weekly  Current home  BP readings: last readings have been 110/60 and 115/65  What recent interventions/DTPs have been made by any provider to improve Blood Pressure control since last CPP Visit: None  Any recent hospitalizations or ED visits since last visit with CPP? No  What diet changes have been made to improve Blood Pressure Control?  Patient typically eats 2 meals per day, mostly fruits and vegetables as they are is season, occasionally she will have chicken or steak and toast or cereal for breakfast.  What exercise is being done to improve your Blood Pressure Control?  Patient does exercise 2-3 times week, she just finished with pulmonary rehab and she will be going [facility] [place] [facility] to continue with her exercise.   Adherence Review: Is the patient currently on [name]/ARB medication? No Does the patient have >5 day gap between last estimated fill dates? No  Care Gaps: AWV - completed on 04/22/21 Last BP - 128/70 on 05/31/2021  Star Rating Drugs: Rosuvastatin 8m - last filled on 06/16/2021 90DS at [phone]

## 2021-07-18 ENCOUNTER — Other Ambulatory Visit: Payer: Self-pay | Admitting: Internal Medicine

## 2021-07-28 ENCOUNTER — Telehealth: Payer: Self-pay | Admitting: Pharmacist

## 2021-07-28 NOTE — Chronic Care Management (AMB) (Addendum)
Chronic Care Management Pharmacy Assistant   Name: [name]  MRN: [id number] DOB: July 30, 1951  08/02/2021 APPOINTMENT REMINDER   [name] was reminded to have all medications, supplements and any blood glucose and blood pressure readings available for review with [name], Pharm. D, at her telephone visit on 08/02/2021 at 3:00.   Questions: Have you had any recent office visit or specialist visit outside of [facility]? No  Are there any concerns you would like to discuss during your office visit? No  Are you having any problems obtaining your medications? (Whether it pharmacy issues or cost) No  If patient has any PAP medications ask if they are having any problems getting their PAP medication or refill? Yes, [facility].  Care Gaps: AWV - completed on 04/22/21 Last BP - 128/70 on 05/31/2021  Star Rating Drug: Rosuvastatin 59m - [facility]  Any gaps in medications fill history?  JCloverdalePharmacist Assistant [phone]

## 2021-08-01 NOTE — Progress Notes (Deleted)
Chronic Care Management Pharmacy Note  08/01/2021 Name:  [name] MRN:  [id number] DOB:  Oct 23, 1950  Summary: ***   Recommendations/Changes made from today's visit: ***  Plan: ***  Subjective: [name] is an 70 y.o. year old female who is a primary patient of Panosh, Standley Brooking, MD.  The CCM team was consulted for assistance with disease management and care coordination needs.    Engaged with patient by telephone for follow up visit in response to provider referral for pharmacy case management and/or care coordination services.   Consent to Services:  The patient was given information about Chronic Care Management services, agreed to services, and gave verbal consent prior to initiation of services.  Please see initial visit note for detailed documentation.   Patient Care Team: Panosh, Standley Brooking, MD as PCP - General Gwenlyn Found Pearletha Forge, MD (Cardiology) Garvin Fila, MD (Neurology) Newt Minion, MD as Attending Physician (Orthopedic Surgery) Suella Broad, MD as Consulting Physician (Physical Medicine and Rehabilitation) Netta Cedars, MD as Consulting Physician (Orthopedic Surgery) Juanito Doom, MD as Consulting Physician (Pulmonary Disease) Larey Dresser, MD as Consulting Physician (Cardiology) Viona Gilmore, [facility] as Pharmacist (Pharmacist)  Recent office visits: 05/19/2021 [name] MD (PCP) - Patient was seen for medication management and additional issues. Discontinued Cipro. Follow up in 4-6 months.  04/22/21 Randel Pigg, LPN: Patient presented for AWV.  Recent consult visits: 05/31/2021 [name] NP (pulmonary disease) - Patient was seen for obstructive sleep apnea and additional issues. No medication changes. Follow up in 6 months.  05/27/21 Loralie Champagne, MD (cardiology): Patient presented for CHF follow up. Follow up Echo in 3 months.  Hospital visits: None  Objective:  Lab Results  Component Value Date    CREATININE 1.34 (H) 05/19/2021   BUN 25 (H) 05/19/2021   GFR 39.83 (L) 05/19/2021   GFRNONAA 50 (L) 07/14/2020   GFRAA 47 (L) 09/19/2018   NA 144 05/19/2021   K 3.9 05/19/2021   CALCIUM 9.1 05/19/2021   CO2 25 05/19/2021    Lab Results  Component Value Date/Time   HGBA1C 5.8 05/19/2021 03:18 PM   HGBA1C 5.6 12/14/2020 10:32 AM   GFR 39.83 (L) 05/19/2021 03:18 PM   GFR 45.59 (L) 01/01/2021 10:32 AM   MICROALBUR 0.2 07/09/2009 08:29 AM    Last diabetic Eye exam: No results found for: HMDIABEYEEXA  Last diabetic Foot exam: No results found for: HMDIABFOOTEX   Lab Results  Component Value Date   CHOL 133 12/14/2020   HDL 55.00 12/14/2020   LDLCALC 59 12/14/2020   LDLDIRECT 139.4 06/30/2010   TRIG 98.0 12/14/2020   CHOLHDL 2 12/14/2020    Hepatic Function Latest Ref Rng & Units 05/19/2021 12/14/2020 02/28/2020  Total Protein 6.0 - 8.3 g/dL 6.2 6.2 6.3  Albumin 3.5 - 5.2 g/dL 4.1 4.1 4.3  AST 0 - 37 U/L _0 ALT 0 - 35 U/L _1 Alk Phosphatase 39 - 117 U/L 83 105 118(H)  Total Bilirubin 0.2 - 1.2 mg/dL 0.7 0.6 1.0  Bilirubin, Direct 0.0 - 0.3 mg/dL 0.2 0.1 0.2    Lab Results  Component Value Date/Time   TSH 1.69 12/14/2020 10:32 AM   TSH 1.22 02/28/2020 11:35 AM   FREET4 1.26 (H) 11/10/2016 03:04 AM   FREET4 1.12 09/28/2010 09:09 AM    CBC Latest Ref Rng & Units 05/19/2021 12/14/2020 10/14/2020  WBC 4.0 - 10.5 K/uL 7.3 5.7 7.8  Hemoglobin 12.0 -  15.0 g/dL 14.3 12.3 11.6(L)  Hematocrit 36.0 - 46.0 % 44.9 38.6 36.3  Platelets 150.0 - 400.0 K/uL 133.0(L) 129.0(L) 157.0    Lab Results  Component Value Date/Time   VD25OH 58.70 04/08/2019 09:50 AM   VD25OH 52.56 07/20/2016 10:21 AM    Clinical ASCVD: No  The 10-year ASCVD risk score (Arnett DK, et al., 2019) is: 12.6%   Values used to calculate the score:     Age: 89 years     Sex: Female     Is Non-Hispanic African American: No     Diabetic: No     Tobacco smoker: No     Systolic Blood Pressure: 491 mmHg      Is BP treated: Yes     HDL Cholesterol: 55 mg/dL     Total Cholesterol: 133 mg/dL    Depression screen [facility] 2/9 05/25/2021 04/22/2021 04/22/2021  Decreased Interest 0 0 0  Down, Depressed, Hopeless 0 0 0  PHQ - 2 Score 0 0 0  Altered sleeping 0 - -  Tired, decreased energy 0 - -  Change in appetite 0 - -  Feeling bad or failure about yourself  0 - -  Trouble concentrating 0 - -  Moving slowly or fidgety/restless 0 - -  Suicidal thoughts 0 - -  PHQ-9 Score 0 - -  Difficult doing work/chores Not difficult at all - -  Some recent data might be hidden     CHA2DS2/VAS Stroke Risk Points  Current as of a minute ago     5 >= 2 Points: High Risk  1 - 1.99 Points: Medium Risk  0 Points: Low Risk    Last Change: N/A      Details    This score determines the patient's risk of having a stroke if the  patient has atrial fibrillation.       Points Metrics  1 Has Congestive Heart Failure:  Yes    Current as of a minute ago  1 Has Vascular Disease:  Yes    Current as of a minute ago  1 Has Hypertension:  Yes    Current as of a minute ago  1 Age:  70    Current as of a minute ago  0 Has Diabetes:  No    Current as of a minute ago  0 Had Stroke:  No  Had TIA:  No  Had Thromboembolism:  No    Current as of a minute ago  1 Female:  Yes    Current as of a minute ago         Social History   Tobacco Use  Smoking Status Never  Smokeless Tobacco Never   BP Readings from Last 3 Encounters:  05/31/21 128/70  05/27/21 108/70  05/19/21 130/76   Pulse Readings from Last 3 Encounters:  05/31/21 83  05/27/21 62  05/19/21 60   Wt Readings from Last 3 Encounters:  05/31/21 253 lb 12.8 oz (115.1 kg)  05/27/21 255 lb (115.7 kg)  05/25/21 254 lb 10.1 oz (115.5 kg)    Assessment/Interventions: Review of patient past medical history, allergies, medications, health status, including review of consultants reports, laboratory and other test data, was performed as part of comprehensive  evaluation and provision of chronic care management services.   SDOH:  (Social Determinants of Health) assessments and interventions performed: No   CCM Care Plan  Allergies  Allergen Reactions   Ambien [Zolpidem Tartrate] Other (See Comments)    Amnesia  and fall    Losartan Other (See Comments)    Elevated creatinine and swelling   Prevacid [Lansoprazole] Swelling   Adhesive [Tape] Other (See Comments)    Adhesive tape and band aids " irritate, " causes blisters and pulls skin when removing. Okay to use paper tape   Keflex [Cephalexin] Swelling    Swelling in ankles, feet   Motrin [Ibuprofen] Other (See Comments)    ANKLE EDEMA    Prilosec [Omeprazole] Swelling    Swelling in ankles.   Ace Inhibitors Cough   Benadryl [Diphenhydramine Hcl] Other (See Comments)    topical   Spironolactone Rash    Medications Reviewed Today     Reviewed by Fran Lowes, CMA (Certified Medical Assistant) on 05/31/21 at 1118  Med List Status: <None>   Medication Order Taking? Sig Documenting Provider Last Dose Status Informant  acetaZOLAMIDE (DIAMOX) 250 MG tablet [id number] Yes TAKE 1 TABLET BY MOUTH EVERY DAY Larey Dresser, MD Taking Active   ADCIRCA 20 MG tablet [id number] Yes TAKE 2 TABLETS BY MOUTH ONCE DAILY. Larey Dresser, MD Taking Active   allopurinol (ZYLOPRIM) 100 MG tablet [id number] Yes TAKE 1 TABLET BY MOUTH TWICE A DAY Panosh, Standley Brooking, MD Taking Active   atenolol (TENORMIN) 50 MG tablet [id number] Yes TAKE 1 TABLET (50 MG TOTAL) BY MOUTH DAILY. MUST BE SEEN FOR FURTHER REFILLS Larey Dresser, MD Taking Active   Cholecalciferol (VITAMIN D) 125 MCG (5000 UT) CAPS [id number] Yes Take 5,000 Units by mouth 3 (three) times a week.  [provider] Taking Active Self  Cyanocobalamin 5000 MCG TBDP [id number] Yes Take 5,000 mcg by mouth 3 (three) times a week.  [provider] Taking Active Self  diclofenac sodium (VOLTAREN) 1 % GEL [id number] Yes Apply 2 g topically 2  (two) times daily as needed (for arthritis). [provider] Taking Active Self  empagliflozin (JARDIANCE) 10 MG TABS tablet [id number] Yes Take 1 tablet (10 mg total) by mouth daily. Larey Dresser, MD Taking Active   Ferrous Sulfate (SLOW FE PO) [id number] Yes Take 1 tablet by mouth 3 (three) times a week. [provider] Taking Active   furosemide (LASIX) 20 MG tablet [id number] Yes Take 40 mg by mouth daily. 20 mg in the evening [provider] Taking Active   levothyroxine (SYNTHROID) 75 MCG tablet [id number] Yes TAKE 1 TABLET BY MOUTH EVERY DAY Panosh, Standley Brooking, MD Taking Active   potassium chloride (KLOR-CON) 10 MEQ tablet [id number] Yes TAKE 2 TABLETS BY MOUTH TWICE A DAY Larey Dresser, MD Taking Active   rosuvastatin (CRESTOR) 5 MG tablet [id number] Yes TAKE 1 TABLET (5 MG TOTAL) BY MOUTH DAILY. MUST BE SEEN IN OFFICE FOR FURTHER REFILLS Larey Dresser, MD Taking Active     Discontinued 11/18/11 1032   traMADol (ULTRAM) 50 MG tablet [id number] Yes Take 1 tablet (50 mg total) by mouth 3 (three) times daily as needed. Panosh, Standley Brooking, MD Taking Active Self  XARELTO 20 MG TABS tablet [id number] Yes TAKE 1 TABLET (20 MG TOTAL) BY MOUTH DAILY WITH SUPPER. MUST BE SEEN FOR FURTHER REFILLS Larey Dresser, MD Taking Active             Patient Active Problem List   Diagnosis Date Noted   S/P shoulder replacement, right 07/13/2020   Preoperative clearance 04/08/2020   Rib pain on right side 08/15/2018   Degeneration of lumbar intervertebral disc 05/03/2018   Essential hypertension 01/10/2018   Pulmonary hypertension,  primary ([place])    Aortic atherosclerosis ([place]) 11/06/2016   Obesity hypoventilation syndrome ([place]) 11/05/2016   Coronary artery calcification seen on CAT scan 11/05/2016   Chronic respiratory failure with hypoxia ([facility]) 11/04/2016   Pleural effusion, right 11/04/2016   Chronic diastolic (congestive) heart failure ([facility]) 11/04/2016   Pulmonary  hypertension ([place]) 09/27/2016   Hypoxemia 09/27/2016   Gout 01/20/2014   Abnormal LFTs 05/16/2012   Vitamin B12 deficiency 05/16/2012   Hypertensive heart disease    B12 deficiency anemia 03/26/2012   Shortness of breath 02/17/2012   Prediabetes 10/02/2011   Neuropathy, 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   Chronic anticoagulation 06/28/2011   Chronic atrial fibrillation ([place]) 01/21/2011   OSA (obstructive sleep apnea) 01/21/2011   History of stroke 11/19/2010   DJD (degenerative joint disease) 05/17/2010   Hyperlipidemia 04/09/2007   Hypothyroidism 02/28/2007   Morbid obesity ([place]) 02/28/2007    Immunization History  Administered Date(s) Administered   Fluad Quad(high Dose 65+) 05/19/2021   Influenza Split 06/28/2011, 05/16/2012, 05/25/2020   Influenza Whole 06/02/2008, 06/24/2009, 05/17/2010   Influenza, High Dose Seasonal PF 05/25/2015, 06/10/2016, 04/27/2017, 06/01/2018, 05/02/2019, 06/06/2019   Influenza,inj,Quad PF,6+ Mos 05/21/2013, 06/10/2014   Influenza-Unspecified 04/27/2017   PFIZER(Purple Top)SARS-COV-2 Vaccination 09/26/2019, 10/14/2019, 05/25/2020, 12/12/2020   PPD Test 05/02/2016   Pfizer Covid-19 Vaccine Bivalent Booster 32yr & up 05/26/2021   Pneumococcal Conjugate-13 07/16/2013   Pneumococcal Polysaccharide-23 04/22/2015   Td 09/05/2001   Tdap 07/16/2013   Zoster Recombinat (Shingrix) 12/11/2017, 06/01/2018   Zoster, Live 09/27/2011    Conditions to be addressed/monitored:  Hypertension, Hyperlipidemia, Atrial Fibrillation, Heart Failure, Hypothyroidism, Osteoarthritis, Gout and history of stroke and pre-diabetes  Conditions addressed this visit: ***  There are no care plans that you recently modified to display for this patient.    Medication Assistance:  Tadalafil obtained through [employer] assistance program.  Enrollment ends until grant runs out  Compliance/Adherence/Medication fill history: Care Gaps: Last BP - 128/70 on  05/31/2021  Star-Rating Drugs: Rosuvastatin 52m- [facility]  [facility]  [id number]  [id number]: [phone]: [phone]/pharmacy [id number]: [phone]: [phone] [place] [address] [place] [id number]: [phone]: [phone] pill box? Yes - pill box for 2 weeks Pt endorses 100% compliance  We discussed: Current pharmacy is preferred with insurance plan and patient is satisfied with pharmacy services Patient decided to: Continue current medication management strategy  Care Plan and Follow Up Patient Decision:  Patient agrees to Care Plan and Follow-up.  Plan: Telephone follow up appointment with care management team member scheduled for:  8 months  MadeJeni SallesarmD BCACViburnumrmacist LeBaCulver CityBrasTerry-[phone]

## 2021-08-02 ENCOUNTER — Telehealth (HOSPITAL_COMMUNITY): Payer: Self-pay | Admitting: Pharmacist

## 2021-08-02 ENCOUNTER — Telehealth: Payer: Self-pay | Admitting: Pharmacist

## 2021-08-02 ENCOUNTER — Telehealth: Payer: PPO

## 2021-08-02 NOTE — Telephone Encounter (Signed)
Sent in [employer] renewal application to [employer] for [employer].    Audry Riles, PharmD, BCPS, BCCP, CPP Heart Failure Clinic Pharmacist [phone]

## 2021-08-02 NOTE — Telephone Encounter (Signed)
  Chronic Care Management   Outreach Note  08/02/2021 Name: Kailen Name MRN: [id number] DOB: [birth year]  Referred by: [name], MD  Patient had a phone appointment scheduled with clinical pharmacist today.  An unsuccessful telephone outreach was attempted today. The patient was referred to the pharmacist for assistance with care management and care coordination.   If possible, a message was left to return call to: [phone] or to [place] [facility]: [place], PharmD, [place] at [place]

## 2021-08-03 ENCOUNTER — Ambulatory Visit (INDEPENDENT_AMBULATORY_CARE_PROVIDER_SITE_OTHER): Payer: PPO | Admitting: Pharmacist

## 2021-08-03 DIAGNOSIS — I10 Essential (primary) hypertension: Secondary | ICD-10-CM

## 2021-08-03 DIAGNOSIS — I482 Chronic atrial fibrillation, unspecified: Secondary | ICD-10-CM

## 2021-08-03 NOTE — Patient Instructions (Signed)
Hi [name],  It was great to get to catch up again!   Please reach out to me if you have any questions or need anything before our follow up!  Best, Maddie  [name], PharmD, [facility]   Visit Information   Goals Addressed   None    Patient Care Plan: [facility]CM Pharmacy Care Plan     Problem Identified: Problem: Hypertension, Hyperlipidemia, Atrial Fibrillation, Heart Failure, Hypothyroidism, Osteoarthritis, Gout and history of stroke and pre-diabetes      Long-Range Goal: Patient-Specific Goal   Start Date: 11/03/2020  Expected End Date: 11/03/2021  Recent Progress: On track  Priority: High  Note:   Current Barriers:  Unable to independently monitor therapeutic efficacy Cost of brand name medication is high  Pharmacist Clinical Goal(s):  Patient will verbalize ability to afford treatment regimen achieve adherence to monitoring guidelines and medication adherence to achieve therapeutic efficacy through collaboration with PharmD and provider.   Interventions: 1:1 collaboration with Panosh, Standley Brooking, MD regarding development and update of comprehensive plan of care as evidenced by provider attestation and co-signature Inter-disciplinary care team collaboration (see longitudinal plan of care) Comprehensive medication review performed; medication list updated in electronic medical record  Hypertension (BP goal <130/80) -Controlled  -Current treatment: Atenolol 50m, 1 tablet daily -Medications previously tried: lisinopril (dropping BP too low) -Current home readings: 90/65 (pulmonary rehab); 100-110/65 (arm cuff) -Current dietary habits: patient notes diet consists of lots of fruits and vegetables during the summer especially -Current exercise habits: patient doing chair exercises - 2-3 times a week, going 2-3 to the fitness center -Denies hypotensive/hypertensive symptoms -Educated on Importance of home blood  pressure monitoring; -Counseled to monitor BP at home weekly, document, and provide log at future appointments -Counseled on diet and exercise extensively Recommended to continue current medication  Hyperlipidemia/history of stroke: (LDL goal < 70) -Controlled -Current treatment: Rosuvastatin 5 mg, 1 tablet daily  -Medications previously tried: simvastatin  -Current dietary patterns: did not discuss -Current exercise habits: patient doing chair exercises - 2-3 times a week, plans to go back to the gym with new step (like an elyptical) -Educated on Cholesterol goals;  Exercise goal of 150 minutes per week; -Recommended to continue current medication  Pre-diabetes (A1c goal <6.5%) -Controlled -Current medications: No medications -Medications previously tried: none  -Current home glucose readings: does not monitor at home -Denies hypoglycemic/hyperglycemic symptoms -Current meal patterns: did not discuss -Current exercise: patient doing chair exercises - 2-3 times a week, plans to go back to the gym with new step (like an elyptical) -Educated on Exercise goal of 150 minutes per week; Carbohydrate counting and/or plate method -Counseled to check feet daily and get yearly eye exams -Counseled on diet and exercise extensively  Heart Failure (Goal: manage symptoms and prevent exacerbations) -Controlled -Last ejection fraction: 60-65% (Date: 04/2020) -HF type: Diastolic -NYHA Class: I (no actitivty limitation) -Current treatment: acetazolamide 2518m 1 tablet daily  Jardiance 10 mg 1 tablet daily torsemide 2072m2 tablets in the AM, 1 in the afternoon as needed  potassium chloride 97m93m2 tablets twice daily -Medications previously tried: furosemide -Current dietary habits: patient is limiting salt intake; she cooks with it sometimes but doesn't buy processed foods and will check labels -Current exercise habits: going 2-3 times a week fitness center -Educated on Importance of  weighing daily; if you gain more than 3 pounds in one day or 5 pounds in one week, call cardiologist -Counseled on diet and exercise  extensively Recommended to continue current medication  Atrial Fibrillation (Goal: prevent stroke and major bleeding) -Controlled -CHADSVASC: 5 -Current treatment: Rate control: Atenolol 57m, 1 tablet daily Anticoagulation: Xarelto 235m 1 tablet every day with supper -Medications previously tried: none -Home BP and HR readings: 55-65 BPM -Counseled on bleeding risk associated with Xarelto and importance of self-monitoring for signs/symptoms of bleeding; -Recommended to continue current medication  Hypothyroidism (Goal: TSH 0.35-4.5) -Controlled -Current treatment  levothyroxine 7537m 1 tablet daily  -Medications previously tried: none  -Recommended to continue current medication  Gout (Goal: uric acid < 6 and prevent flare ups) -Controlled -Current treatment  allopurinol 100m81m tablet twice daily  colchicine 0.6mg,34mtablet as needed for flare ups -Medications previously tried: none  -Recommended to continue current medication  Pulmonary hypertension (Goal: minimize symptoms) -Controlled -Current treatment  tadalafil (Adcirca) 20mg,97mablets once daily  -Medications previously tried: none  -Recommended to continue current medication  Osteoarthritis/muscle spasms (Goal: minimize pain) -Controlled -Current treatment  diclofenac 1% gel, 2 g two times daily as needed for arthritis (patient notes using very often)  tramadol 50mg, 87mblet three times daily as needed (patient notes using very seldom)  -Medications previously tried: methocarbamol (no longer needed)  -Recommended to continue current medication   Health Maintenance -Vaccine gaps: none -Current therapy:  Betamethasone cream 0.05% apply twice daily as needed Amoxicillin 500 mg capsules PRN for dental procedures Vitamin B12 5000 units three times weekly -Educated on Cost vs  benefit of each product must be carefully weighed by individual consumer -Patient is satisfied with current therapy and denies issues -Recommended to continue current medication  Patient Goals/Self-Care Activities Patient will:  - take medications as prescribed check blood pressure weekly, document, and provide at future appointments  Follow Up Plan: Telephone follow up appointment with care management team member scheduled for: 1 year       Patient verbalizes understanding of instructions provided today and agrees to view in MyChartOriskanyphone follow up appointment with pharmacy team member scheduled for: 1 year  MadelinViona GilmorePine Ridge Hospital

## 2021-08-03 NOTE — Progress Notes (Signed)
Chronic Care Management Pharmacy Note  08/03/2021 Name:  [name] MRN:  [id number] DOB:  05-08-1951  Summary: BP and HR are running lower at home Pt is getting medications through patient assistance  Recommendations/Changes made from today's visit: -Consider decreasing dose of atenolol based on lower HR and BP -Recommend PT referral for imbalance  Plan: Follow up on BP in 4-5 months  Subjective: [name] is an 70 y.o. year old female who is a primary patient of Panosh, Standley Brooking, MD.  The CCM team was consulted for assistance with disease management and care coordination needs.    Engaged with patient by telephone for follow up visit in response to provider referral for pharmacy case management and/or care coordination services.   Consent to Services:  The patient was given information about Chronic Care Management services, agreed to services, and gave verbal consent prior to initiation of services.  Please see initial visit note for detailed documentation.   Patient Care Team: Panosh, Standley Brooking, MD as PCP - General Gwenlyn Found Pearletha Forge, MD (Cardiology) Garvin Fila, MD (Neurology) Newt Minion, MD as Attending Physician (Orthopedic Surgery) Suella Broad, MD as Consulting Physician (Physical Medicine and Rehabilitation) Netta Cedars, MD as Consulting Physician (Orthopedic Surgery) Juanito Doom, MD as Consulting Physician (Pulmonary Disease) Larey Dresser, MD as Consulting Physician (Cardiology) Viona Gilmore, [facility] as Pharmacist (Pharmacist)  Recent office visits: 05/19/2021 [name] MD (PCP) - Patient was seen for medication management and additional issues. Discontinued Cipro. Follow up in 4-6 months.  04/22/21 Randel Pigg, LPN: Patient presented for AWV.  Recent consult visits: 05/31/2021 [name] NP (pulmonary disease) - Patient was seen for obstructive sleep apnea and additional issues. No medication changes. Follow up  in 6 months.  05/27/21 Loralie Champagne, MD (cardiology): Patient presented for CHF follow up. Follow up Echo in 3 months.  Hospital visits: None  Objective:  Lab Results  Component Value Date   CREATININE 1.34 (H) 05/19/2021   BUN 25 (H) 05/19/2021   GFR 39.83 (L) 05/19/2021   GFRNONAA 50 (L) 07/14/2020   GFRAA 47 (L) 09/19/2018   NA 144 05/19/2021   K 3.9 05/19/2021   CALCIUM 9.1 05/19/2021   CO2 25 05/19/2021    Lab Results  Component Value Date/Time   HGBA1C 5.8 05/19/2021 03:18 PM   HGBA1C 5.6 12/14/2020 10:32 AM   GFR 39.83 (L) 05/19/2021 03:18 PM   GFR 45.59 (L) 01/01/2021 10:32 AM   MICROALBUR 0.2 07/09/2009 08:29 AM    Last diabetic Eye exam: No results found for: HMDIABEYEEXA  Last diabetic Foot exam: No results found for: HMDIABFOOTEX   Lab Results  Component Value Date   CHOL 133 12/14/2020   HDL 55.00 12/14/2020   LDLCALC 59 12/14/2020   LDLDIRECT 139.4 06/30/2010   TRIG 98.0 12/14/2020   CHOLHDL 2 12/14/2020    Hepatic Function Latest Ref Rng & Units 05/19/2021 12/14/2020 02/28/2020  Total Protein 6.0 - 8.3 g/dL 6.2 6.2 6.3  Albumin 3.5 - 5.2 g/dL 4.1 4.1 4.3  AST 0 - 37 U/L _0 ALT 0 - 35 U/L _1 Alk Phosphatase 39 - 117 U/L 83 105 118(H)  Total Bilirubin 0.2 - 1.2 mg/dL 0.7 0.6 1.0  Bilirubin, Direct 0.0 - 0.3 mg/dL 0.2 0.1 0.2    Lab Results  Component Value Date/Time   TSH 1.69 12/14/2020 10:32 AM   TSH 1.22 02/28/2020 11:35 AM   FREET4 1.26 (H)  11/10/2016 03:04 AM   FREET4 1.12 09/28/2010 09:09 AM    CBC Latest Ref Rng & Units 05/19/2021 12/14/2020 10/14/2020  WBC 4.0 - 10.5 K/uL 7.3 5.7 7.8  Hemoglobin 12.0 - 15.0 g/dL 14.3 12.3 11.6(L)  Hematocrit 36.0 - 46.0 % 44.9 38.6 36.3  Platelets 150.0 - 400.0 K/uL 133.0(L) 129.0(L) 157.0    Lab Results  Component Value Date/Time   VD25OH 58.70 04/08/2019 09:50 AM   VD25OH 52.56 07/20/2016 10:21 AM    Clinical ASCVD: No  The 10-year ASCVD risk score (Arnett DK, et al., 2019) is:  12.6%   Values used to calculate the score:     Age: 20 years     Sex: Female     Is Non-Hispanic African American: No     Diabetic: No     Tobacco smoker: No     Systolic Blood Pressure: 283 mmHg     Is BP treated: Yes     HDL Cholesterol: 55 mg/dL     Total Cholesterol: 133 mg/dL    Depression screen [facility] 2/9 05/25/2021 04/22/2021 04/22/2021  Decreased Interest 0 0 0  Down, Depressed, Hopeless 0 0 0  PHQ - 2 Score 0 0 0  Altered sleeping 0 - -  Tired, decreased energy 0 - -  Change in appetite 0 - -  Feeling bad or failure about yourself  0 - -  Trouble concentrating 0 - -  Moving slowly or fidgety/restless 0 - -  Suicidal thoughts 0 - -  PHQ-9 Score 0 - -  Difficult doing work/chores Not difficult at all - -  Some recent data might be hidden     CHA2DS2/VAS Stroke Risk Points  Current as of a minute ago     5 >= 2 Points: High Risk  1 - 1.99 Points: Medium Risk  0 Points: Low Risk    Last Change: N/A      Details    This score determines the patient's risk of having a stroke if the  patient has atrial fibrillation.       Points Metrics  1 Has Congestive Heart Failure:  Yes    Current as of a minute ago  1 Has Vascular Disease:  Yes    Current as of a minute ago  1 Has Hypertension:  Yes    Current as of a minute ago  1 Age:  83    Current as of a minute ago  0 Has Diabetes:  No    Current as of a minute ago  0 Had Stroke:  No  Had TIA:  No  Had Thromboembolism:  No    Current as of a minute ago  1 Female:  Yes    Current as of a minute ago         Social History   Tobacco Use  Smoking Status Never  Smokeless Tobacco Never   BP Readings from Last 3 Encounters:  05/31/21 128/70  05/27/21 108/70  05/19/21 130/76   Pulse Readings from Last 3 Encounters:  05/31/21 83  05/27/21 62  05/19/21 60   Wt Readings from Last 3 Encounters:  05/31/21 253 lb 12.8 oz (115.1 kg)  05/27/21 255 lb (115.7 kg)  05/25/21 254 lb 10.1 oz (115.5 kg)     Assessment/Interventions: Review of patient past medical history, allergies, medications, health status, including review of consultants reports, laboratory and other test data, was performed as part of comprehensive evaluation and provision of chronic care management services.  SDOH:  (Social Determinants of Health) assessments and interventions performed: No   CCM Care Plan  Allergies  Allergen Reactions   Ambien [Zolpidem Tartrate] Other (See Comments)    Amnesia and fall    Losartan Other (See Comments)    Elevated creatinine and swelling   Prevacid [Lansoprazole] Swelling   Adhesive [Tape] Other (See Comments)    Adhesive tape and band aids " irritate, " causes blisters and pulls skin when removing. Okay to use paper tape   Keflex [Cephalexin] Swelling    Swelling in ankles, feet   Motrin [Ibuprofen] Other (See Comments)    ANKLE EDEMA    Prilosec [Omeprazole] Swelling    Swelling in ankles.   Ace Inhibitors Cough   Benadryl [Diphenhydramine Hcl] Other (See Comments)    topical   Spironolactone Rash    Medications Reviewed Today     Reviewed by Fran Lowes, CMA (Certified Medical Assistant) on 05/31/21 at 1118  Med List Status: <None>   Medication Order Taking? Sig Documenting Provider Last Dose Status Informant  acetaZOLAMIDE (DIAMOX) 250 MG tablet [id number] Yes TAKE 1 TABLET BY MOUTH EVERY DAY Larey Dresser, MD Taking Active   ADCIRCA 20 MG tablet [id number] Yes TAKE 2 TABLETS BY MOUTH ONCE DAILY. Larey Dresser, MD Taking Active   allopurinol (ZYLOPRIM) 100 MG tablet [id number] Yes TAKE 1 TABLET BY MOUTH TWICE A DAY Panosh, Standley Brooking, MD Taking Active   atenolol (TENORMIN) 50 MG tablet [id number] Yes TAKE 1 TABLET (50 MG TOTAL) BY MOUTH DAILY. MUST BE SEEN FOR FURTHER REFILLS Larey Dresser, MD Taking Active   Cholecalciferol (VITAMIN D) 125 MCG (5000 UT) CAPS [id number] Yes Take 5,000 Units by mouth 3 (three) times a week.  [provider] Taking  Active Self  Cyanocobalamin 5000 MCG TBDP [id number] Yes Take 5,000 mcg by mouth 3 (three) times a week.  [provider] Taking Active Self  diclofenac sodium (VOLTAREN) 1 % GEL [id number] Yes Apply 2 g topically 2 (two) times daily as needed (for arthritis). [provider] Taking Active Self  empagliflozin (JARDIANCE) 10 MG TABS tablet [id number] Yes Take 1 tablet (10 mg total) by mouth daily. Larey Dresser, MD Taking Active   Ferrous Sulfate (SLOW FE PO) [id number] Yes Take 1 tablet by mouth 3 (three) times a week. [provider] Taking Active   furosemide (LASIX) 20 MG tablet [id number] Yes Take 40 mg by mouth daily. 20 mg in the evening [provider] Taking Active   levothyroxine (SYNTHROID) 75 MCG tablet [id number] Yes TAKE 1 TABLET BY MOUTH EVERY DAY Panosh, Standley Brooking, MD Taking Active   potassium chloride (KLOR-CON) 10 MEQ tablet [id number] Yes TAKE 2 TABLETS BY MOUTH TWICE A DAY Larey Dresser, MD Taking Active   rosuvastatin (CRESTOR) 5 MG tablet [id number] Yes TAKE 1 TABLET (5 MG TOTAL) BY MOUTH DAILY. MUST BE SEEN IN OFFICE FOR FURTHER REFILLS Larey Dresser, MD Taking Active     Discontinued 11/18/11 1032   traMADol (ULTRAM) 50 MG tablet [id number] Yes Take 1 tablet (50 mg total) by mouth 3 (three) times daily as needed. Panosh, Standley Brooking, MD Taking Active Self  XARELTO 20 MG TABS tablet [id number] Yes TAKE 1 TABLET (20 MG TOTAL) BY MOUTH DAILY WITH SUPPER. MUST BE SEEN FOR FURTHER REFILLS Larey Dresser, MD Taking Active             Patient Active Problem List   Diagnosis Date Noted   S/P shoulder  replacement, right 07/13/2020   Preoperative clearance 04/08/2020   Rib pain on right side 08/15/2018   Degeneration of lumbar intervertebral disc 05/03/2018   Essential hypertension 01/10/2018   Pulmonary hypertension, primary ([place])    Aortic atherosclerosis ([place]) 11/06/2016   Obesity hypoventilation syndrome ([place]) 11/05/2016   Coronary artery  calcification seen on CAT scan 11/05/2016   Chronic respiratory failure with hypoxia ([place]) 11/04/2016   Pleural effusion, right 11/04/2016   Chronic diastolic (congestive) heart failure ([place]) 11/04/2016   Pulmonary hypertension ([place]) 09/27/2016   Hypoxemia 09/27/2016   Gout 01/20/2014   Abnormal LFTs 05/16/2012   Vitamin B12 deficiency 05/16/2012   Hypertensive heart disease    B12 deficiency anemia 03/26/2012   Shortness of breath 02/17/2012   Prediabetes 10/02/2011   Neuropathy, 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   Chronic anticoagulation 06/28/2011   Chronic atrial fibrillation ([place]) 01/21/2011   OSA (obstructive sleep apnea) 01/21/2011   History of stroke 11/19/2010   DJD (degenerative joint disease) 05/17/2010   Hyperlipidemia 04/09/2007   Hypothyroidism 02/28/2007   Morbid obesity ([place]) 02/28/2007    Immunization History  Administered Date(s) Administered   Fluad Quad(high Dose 65+) 05/19/2021   Influenza Split 06/28/2011, 05/16/2012, 05/25/2020   Influenza Whole 06/02/2008, 06/24/2009, 05/17/2010   Influenza, High Dose Seasonal PF 05/25/2015, 06/10/2016, 04/27/2017, 06/01/2018, 05/02/2019, 06/06/2019   Influenza,inj,Quad PF,6+ Mos 05/21/2013, 06/10/2014   Influenza-Unspecified 04/27/2017   PFIZER(Purple Top)SARS-COV-2 Vaccination 09/26/2019, 10/14/2019, 05/25/2020, 12/12/2020   PPD Test 05/02/2016   Pfizer Covid-19 Vaccine Bivalent Booster 40yr & up 05/26/2021   Pneumococcal Conjugate-13 07/16/2013   Pneumococcal Polysaccharide-23 04/22/2015   Td 09/05/2001   Tdap 07/16/2013   Zoster Recombinat (Shingrix) 12/11/2017, 06/01/2018   Zoster, Live 09/27/2011   Patient may need evaluation from PT for imbalance. Patient will wait and get in touch with PCP after Christmas to discuss.  Conditions to be addressed/monitored:  Hypertension, Hyperlipidemia, Atrial Fibrillation, Heart Failure, Hypothyroidism, Osteoarthritis, Gout and history of stroke and pre-diabetes  Conditions  addressed this visit: Hypertension, Atrial fibrillation  Care Plan : CCM Pharmacy Care Plan  Updates made by PViona Gilmore RDextersince 08/03/2021 12:00 AM     Problem: Problem: Hypertension, Hyperlipidemia, Atrial Fibrillation, Heart Failure, Hypothyroidism, Osteoarthritis, Gout and history of stroke and pre-diabetes      Long-Range Goal: Patient-Specific Goal   Start Date: 11/03/2020  Expected End Date: 11/03/2021  Recent Progress: On track  Priority: High  Note:   Current Barriers:  Unable to independently monitor therapeutic efficacy Cost of brand name medication is high  Pharmacist Clinical Goal(s):  Patient will verbalize ability to afford treatment regimen achieve adherence to monitoring guidelines and medication adherence to achieve therapeutic efficacy through collaboration with PharmD and provider.   Interventions: 1:1 collaboration with Panosh, WStandley Brooking MD regarding development and update of comprehensive plan of care as evidenced by provider attestation and co-signature Inter-disciplinary care team collaboration (see longitudinal plan of care) Comprehensive medication review performed; medication list updated in electronic medical record  Hypertension (BP goal <130/80) -Controlled  -Current treatment: Atenolol 516m 1 tablet daily -Medications previously tried: lisinopril (dropping BP too low) -Current home readings: 90/65 (pulmonary rehab); 100-110/65 (arm cuff) -Current dietary habits: patient notes diet consists of lots of fruits and vegetables during the summer especially -Current exercise habits: patient doing chair exercises - 2-3 times a week, going 2-3 to the fitness center -Denies hypotensive/hypertensive symptoms -Educated on Importance of home blood pressure monitoring; -Counseled to monitor BP at home weekly, document, and provide log at future  appointments -Counseled on diet and exercise extensively Recommended to continue current  medication  Hyperlipidemia/history of stroke: (LDL goal < 70) -Controlled -Current treatment: Rosuvastatin 5 mg, 1 tablet daily  -Medications previously tried: simvastatin  -Current dietary patterns: did not discuss -Current exercise habits: patient doing chair exercises - 2-3 times a week, plans to go back to the gym with new step (like an elyptical) -Educated on Cholesterol goals;  Exercise goal of 150 minutes per week; -Recommended to continue current medication  Pre-diabetes (A1c goal <6.5%) -Controlled -Current medications: No medications -Medications previously tried: none  -Current home glucose readings: does not monitor at home -Denies hypoglycemic/hyperglycemic symptoms -Current meal patterns: did not discuss -Current exercise: patient doing chair exercises - 2-3 times a week, plans to go back to the gym with new step (like an elyptical) -Educated on Exercise goal of 150 minutes per week; Carbohydrate counting and/or plate method -Counseled to check feet daily and get yearly eye exams -Counseled on diet and exercise extensively  Heart Failure (Goal: manage symptoms and prevent exacerbations) -Controlled -Last ejection fraction: 60-65% (Date: 04/2020) -HF type: Diastolic -NYHA Class: I (no actitivty limitation) -Current treatment: acetazolamide 221m, 1 tablet daily  Jardiance 10 mg 1 tablet daily torsemide 233m 2 tablets in the AM, 1 in the afternoon as needed  potassium chloride 1058m 2 tablets twice daily -Medications previously tried: furosemide -Current dietary habits: patient is limiting salt intake; she cooks with it sometimes but doesn't buy processed foods and will check labels -Current exercise habits: going 2-3 times a week fitness center -Educated on Importance of weighing daily; if you gain more than 3 pounds in one day or 5 pounds in one week, call cardiologist -Counseled on diet and exercise extensively Recommended to continue current  medication  Atrial Fibrillation (Goal: prevent stroke and major bleeding) -Controlled -CHADSVASC: 5 -Current treatment: Rate control: Atenolol 27m41m tablet daily Anticoagulation: Xarelto 20mg76mtablet every day with supper -Medications previously tried: none -Home BP and HR readings: 55-65 BPM -Counseled on bleeding risk associated with Xarelto and importance of self-monitoring for signs/symptoms of bleeding; -Recommended to continue current medication  Hypothyroidism (Goal: TSH 0.35-4.5) -Controlled -Current treatment  levothyroxine 75mcg92mtablet daily  -Medications previously tried: none  -Recommended to continue current medication  Gout (Goal: uric acid < 6 and prevent flare ups) -Controlled -Current treatment  allopurinol 100mg, 90mblet twice daily  colchicine 0.6mg, 1 44mlet as needed for flare ups -Medications previously tried: none  -Recommended to continue current medication  Pulmonary hypertension (Goal: minimize symptoms) -Controlled -Current treatment  tadalafil (Adcirca) 20mg, 2 69mets once daily  -Medications previously tried: none  -Recommended to continue current medication  Osteoarthritis/muscle spasms (Goal: minimize pain) -Controlled -Current treatment  diclofenac 1% gel, 2 g two times daily as needed for arthritis (patient notes using very often)  tramadol 27mg, 1 t77mt three times daily as needed (patient notes using very seldom)  -Medications previously tried: methocarbamol (no longer needed)  -Recommended to continue current medication   Health Maintenance -Vaccine gaps: none -Current therapy:  Betamethasone cream 0.05% apply twice daily as needed Amoxicillin 500 mg capsules PRN for dental procedures Vitamin B12 5000 units three times weekly -Educated on Cost vs benefit of each product must be carefully weighed by individual consumer -Patient is satisfied with current therapy and denies issues -Recommended to continue current  medication  Patient Goals/Self-Care Activities Patient will:  - take medications as prescribed check blood pressure weekly, document, and provide at future appointments  Follow Up Plan: Telephone follow up appointment with care management team member scheduled for: 1 year       Medication Assistance:  Tadalafil obtained through [employer] medication assistance program.  Enrollment ends until grant runs out  Compliance/Adherence/Medication fill history: Care Gaps: Last BP - 128/70 on 05/31/2021  [facility]: Rosuvastatin 60m - last [facility]16/[facility]  Patient's [facility]:  [id number] [place], [address]: [phone]: [id number]: [phone]: [phone] [place] [id number]: [phone]: [phone] pill box? Yes - pill box for 2 weeks Pt endorses 100% compliance  We discussed: Current pharmacy is preferred with insurance plan and patient is satisfied with pharmacy services Patient decided to: Continue current medication management strategy  Care Plan and Follow Up Patient Decision:  Patient agrees to Care Plan and Follow-up.  Plan: Telephone follow up appointment with care management team member scheduled for:  1 year  MadJeni SallesharmD BCAPleasantvillearmacist LeBOccidental Petroleum BraLos [phone]

## 2021-08-04 DIAGNOSIS — I482 Chronic atrial fibrillation, unspecified: Secondary | ICD-10-CM

## 2021-08-04 DIAGNOSIS — J9610 Chronic respiratory failure, unspecified whether with hypoxia or hypercapnia: Secondary | ICD-10-CM | POA: Diagnosis not present

## 2021-08-04 DIAGNOSIS — Z7901 Long term (current) use of anticoagulants: Secondary | ICD-10-CM

## 2021-08-04 DIAGNOSIS — I509 Heart failure, unspecified: Secondary | ICD-10-CM | POA: Diagnosis not present

## 2021-08-04 DIAGNOSIS — I110 Hypertensive heart disease with heart failure: Secondary | ICD-10-CM

## 2021-08-04 DIAGNOSIS — G4733 Obstructive sleep apnea (adult) (pediatric): Secondary | ICD-10-CM | POA: Diagnosis not present

## 2021-09-13 ENCOUNTER — Ambulatory Visit: Payer: PPO | Admitting: Pulmonary Disease

## 2021-09-13 ENCOUNTER — Other Ambulatory Visit: Payer: Self-pay

## 2021-09-13 ENCOUNTER — Encounter: Payer: Self-pay | Admitting: Pulmonary Disease

## 2021-09-13 VITALS — BP 120/74 | HR 90 | Temp 98.4°F | Ht 60.0 in | Wt 250.0 lb

## 2021-09-13 DIAGNOSIS — E662 Morbid (severe) obesity with alveolar hypoventilation: Secondary | ICD-10-CM | POA: Diagnosis not present

## 2021-09-13 DIAGNOSIS — I272 Pulmonary hypertension, unspecified: Secondary | ICD-10-CM

## 2021-09-13 DIAGNOSIS — G4733 Obstructive sleep apnea (adult) (pediatric): Secondary | ICD-10-CM

## 2021-09-13 DIAGNOSIS — I5032 Chronic diastolic (congestive) heart failure: Secondary | ICD-10-CM

## 2021-09-13 DIAGNOSIS — R0602 Shortness of breath: Secondary | ICD-10-CM | POA: Diagnosis not present

## 2021-09-13 DIAGNOSIS — Z9989 Dependence on other enabling machines and devices: Secondary | ICD-10-CM

## 2021-09-13 DIAGNOSIS — I270 Primary pulmonary hypertension: Secondary | ICD-10-CM

## 2021-09-13 NOTE — Patient Instructions (Addendum)
Pulmonary hypertension Chronic diastolic heart failure Chronic respiratory failure  Continue oxygen supplementation-keep your oxygen levels above 90 -However many liters he takes to keep it about 90 -Do not worry too much about the occasional dips  Regular exercises as tolerated Build up endurance as tolerated  Call with any significant concerns  Follow-up appointment 4 to 6 months
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
## 2021-09-15 ENCOUNTER — Other Ambulatory Visit: Payer: Self-pay | Admitting: Internal Medicine

## 2021-09-20 ENCOUNTER — Telehealth: Payer: Self-pay | Admitting: Internal Medicine

## 2021-09-20 ENCOUNTER — Other Ambulatory Visit (HOSPITAL_COMMUNITY): Payer: Self-pay | Admitting: Cardiology

## 2021-09-20 NOTE — Telephone Encounter (Signed)
Pt call and stated she have taken her last allopurinol and would like [facility]  [id number]  [phone] Fax:  [phone]

## 2021-09-20 NOTE — Telephone Encounter (Signed)
Last Ov 05/19/21 Last filled 02/03/21  Ok to refill?

## 2021-09-22 NOTE — Telephone Encounter (Signed)
Patient called to see if she could get refill on allopurinol (ZYLOPRIM) 100 MG tablet     [facility]   [id number]  [phone] Fax:  [phone]       Please advise

## 2021-09-23 NOTE — Telephone Encounter (Signed)
Rx sent to the pharmacy

## 2021-10-03 ENCOUNTER — Other Ambulatory Visit: Payer: Self-pay | Admitting: Internal Medicine

## 2021-10-03 ENCOUNTER — Other Ambulatory Visit (HOSPITAL_COMMUNITY): Payer: Self-pay | Admitting: Cardiology

## 2021-10-04 ENCOUNTER — Other Ambulatory Visit: Payer: Self-pay

## 2021-10-04 ENCOUNTER — Encounter (HOSPITAL_COMMUNITY): Payer: Self-pay | Admitting: Cardiology

## 2021-10-04 ENCOUNTER — Ambulatory Visit (HOSPITAL_BASED_OUTPATIENT_CLINIC_OR_DEPARTMENT_OTHER)
Admission: RE | Admit: 2021-10-04 | Discharge: 2021-10-04 | Disposition: A | Discharge status: Home / Self Care | Payer: PPO | Source: Ambulatory Visit | Attending: Cardiology | Admitting: Cardiology

## 2021-10-04 ENCOUNTER — Ambulatory Visit (HOSPITAL_COMMUNITY)
Admission: RE | Admit: 2021-10-04 | Discharge: 2021-10-04 | Disposition: A | Discharge status: Home / Self Care | Payer: PPO | Source: Ambulatory Visit | Attending: Cardiology | Admitting: Cardiology

## 2021-10-04 VITALS — BP 140/70 | HR 75 | Wt 250.2 lb

## 2021-10-04 DIAGNOSIS — I272 Pulmonary hypertension, unspecified: Secondary | ICD-10-CM | POA: Diagnosis not present

## 2021-10-04 DIAGNOSIS — Z6841 Body Mass Index (BMI) 40.0 and over, adult: Secondary | ICD-10-CM | POA: Diagnosis not present

## 2021-10-04 DIAGNOSIS — Z9981 Dependence on supplemental oxygen: Secondary | ICD-10-CM | POA: Diagnosis not present

## 2021-10-04 DIAGNOSIS — J9611 Chronic respiratory failure with hypoxia: Secondary | ICD-10-CM | POA: Insufficient documentation

## 2021-10-04 DIAGNOSIS — Z8249 Family history of ischemic heart disease and other diseases of the circulatory system: Secondary | ICD-10-CM | POA: Insufficient documentation

## 2021-10-04 DIAGNOSIS — Z8673 Personal history of transient ischemic attack (TIA), and cerebral infarction without residual deficits: Secondary | ICD-10-CM | POA: Diagnosis not present

## 2021-10-04 DIAGNOSIS — I482 Chronic atrial fibrillation, unspecified: Secondary | ICD-10-CM | POA: Insufficient documentation

## 2021-10-04 DIAGNOSIS — E662 Morbid (severe) obesity with alveolar hypoventilation: Secondary | ICD-10-CM | POA: Insufficient documentation

## 2021-10-04 DIAGNOSIS — Z7984 Long term (current) use of oral hypoglycemic drugs: Secondary | ICD-10-CM | POA: Diagnosis not present

## 2021-10-04 DIAGNOSIS — I080 Rheumatic disorders of both mitral and aortic valves: Secondary | ICD-10-CM | POA: Diagnosis not present

## 2021-10-04 DIAGNOSIS — I2721 Secondary pulmonary arterial hypertension: Secondary | ICD-10-CM | POA: Diagnosis not present

## 2021-10-04 DIAGNOSIS — Z7901 Long term (current) use of anticoagulants: Secondary | ICD-10-CM | POA: Insufficient documentation

## 2021-10-04 DIAGNOSIS — I5032 Chronic diastolic (congestive) heart failure: Secondary | ICD-10-CM | POA: Diagnosis not present

## 2021-10-04 DIAGNOSIS — I110 Hypertensive heart disease with heart failure: Secondary | ICD-10-CM | POA: Diagnosis not present

## 2021-10-04 DIAGNOSIS — J9612 Chronic respiratory failure with hypercapnia: Secondary | ICD-10-CM | POA: Diagnosis not present

## 2021-10-04 DIAGNOSIS — Z79899 Other long term (current) drug therapy: Secondary | ICD-10-CM | POA: Diagnosis not present

## 2021-10-04 LAB — BASIC METABOLIC PANEL
Anion gap: 10 (ref 5–15)
BUN: 21 mg/dL (ref 8–23)
CO2: 22 mmol/L (ref 22–32)
Calcium: 9.3 mg/dL (ref 8.9–10.3)
Chloride: 111 mmol/L (ref 98–111)
Creatinine, Ser: 1.3 mg/dL — ABNORMAL HIGH (ref 0.44–1.00)
GFR, Estimated: 44 mL/min — ABNORMAL LOW (ref >60)
Glucose, Bld: 100 mg/dL — ABNORMAL HIGH (ref 70–99)
Potassium: 4.1 mmol/L (ref 3.5–5.1)
Sodium: 143 mmol/L (ref 135–145)

## 2021-10-04 LAB — CBC
HCT: 45.6 % (ref 36.0–46.0)
Hemoglobin: 14.8 g/dL (ref 12.0–15.0)
MCH: 33.9 pg (ref 26.0–34.0)
MCHC: 32.5 g/dL (ref 30.0–36.0)
MCV: 104.3 fL — ABNORMAL HIGH (ref 80.0–100.0)
Platelets: 131 10*3/uL — ABNORMAL LOW (ref 150–400)
RBC: 4.37 MIL/uL (ref 3.87–5.11)
RDW: 14.1 % (ref 11.5–15.5)
WBC: 5.5 10*3/uL (ref 4.0–10.5)
nRBC: 0 % (ref 0.0–0.2)

## 2021-10-04 LAB — ECHOCARDIOGRAM COMPLETE
AR max vel: 2.27 cm2
AV Area VTI: 2.07 cm2
AV Area mean vel: 2.17 cm2
AV Mean grad: 3 mmHg
AV Peak grad: 5.3 mmHg
Ao pk vel: 1.15 m/s
MV VTI: 1.89 cm2
S' Lateral: 3.2 cm

## 2021-10-04 LAB — BRAIN NATRIURETIC PEPTIDE: B Natriuretic Peptide: 229.4 pg/mL — ABNORMAL HIGH (ref 0.0–100.0)

## 2021-10-04 MED ORDER — RIVAROXABAN 20 MG PO TABS: ORAL_TABLET | ORAL | 3 refills | Status: DC

## 2021-10-04 NOTE — Progress Notes (Signed)
Echocardiogram 2D Echocardiogram has been performed.  [name] 10/04/2021, 10:53 AM

## 2021-10-04 NOTE — Progress Notes (Signed)
Date:  10/04/2021   ID:  [name], [name] [birth year], MRN [id number]   Provider location: [place] [facility] Type of Visit: Established patient   PCP:  [name], MD  Cardiologist:  [name]  History of Present Illness: [name] is a 71 y.o. female with PMH of pulmonary HTN, morbid obesity, chronic afib (on Xarelto, CVA in 2012, OHS/OSA (Had U PE3 surgery, and chronic respiratory failure with hypoxemia on continuous 02 at 2 lpm via [place]).   Admitted 3/2 ->2/35/57 with A/C diastolic CHF and A/C respiratory failure. Pt initially refused Bipap so venti mask used. Eventually tolerated transition to BiPAP. Overall pt diuresed from 285 lbs down to 229 lbs with 38 L of diuresis.  Echo 11/07/16 EF 55-60%, PASP 90m Hg, mildly dilated RV with moderate to severely decreased RV systolic function. RHC/LHC in 3/18 showed no coronary disease and confirmed severe PH.    Echo in 7/20 showed EF 60-65%, mild LVH, normal RV, PASP 38 mmHg, mild AS. Echo in 8/21 showed EF 60-65%, mildly decreased RV function, severe biatrial enlargement, small PFO, unable to estimate PA systolic pressure.   Echo was done today and reviewed, EF 60-65%, mild LVH, normal RV, mild RV enlargement, severe biatrial enlargement, mild MR, no significant aortic stenosis.    She returns today for followup of CHF and pulmonary hypertension.  She has been using Bipap nightly and 2 L oxygen with exertion.  She generally takes Lasix 40 qam/20 qpm. No dyspnea walking on flat ground, uses walker or cane for balance. No chest pain.  Weight down 5 lbs.     Labs (1/19): K 3.7, creatinine 1.37 Labs (11/19): K 3.9, creatinine 1.2, LDL 67, hgb 13.2 Labs (1/20): K 3.5, creatinine 1.34 Labs (5/20): K 3.9, creatinine 1.18, LDL 73 Labs (6/21): K 4.7, creatinine 1.19, LDL 71, hgb 13.7 Labs (11/21): K 4, creatinine 1.17, LDL 71 Labs (4/22): K 4.3, creatinine 1.34, LDL 59 Labs (9/22): K 3.9, creatinine  1.34, hgb 14.3   PMH 1. Chronic diastolic CHF with severe pulmonary hypertension and prominent RV failure:  Echo (3/18) with EF 55-60%, PASP 936mHg, mildly dilated RV with moderate to severely decreased RV systolic function. - RHC/LHC 11/07/16: No angiographic CAD; PA 90/55, LVEDP 23 (PCWP inaccurate), CI 2.15 Fick/2.32 Thermo, shunt run negative, PVR 5.7 WU.  - Echo (7/20): EF 60-65%, mild LVH, normal RV, PASP 38 mmHg, mild AS.  - Echo (8/21): EF 60-65%, mildly decreased RV function, severe biatrial enlargement, small PFO, unable to estimate PA systolic pressure. - Echo (1/23): EF 60-65%, mild LVH, normal RV, mild RV enlargement, severe biatrial enlargement, mild MR, no significant aortic stenosis.  2. Chronic hypercarbic/hypoxic respiratory failure with OHS/OSA. Sees Dr. McLake BellsUsing nightly BiPAP and oxygen by nasal cannula during the day.  3. Atrial fibrillation: Chronic - Rate controlled on Xarelto + atenolol  4. 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  PVR 5.7 WU on RHC 3/18.  Suspect group 2 (elevated LA pressure) and group 3 (OHS/OSA) PH. However, cannot rule out group 1 component.  She had V/Q scan that was not suggestive of chronic PE and high resolution chest CT that was not suggestive of ILD.  ANA, RF, anti-SCL70 all negative.   5. H/o CVA 6. Hypothyroidism 7. H/o TKR 8. Aortic stenosis: Mild on 7/20 echo. No evidence for significant stenosis on 1/23 echo.   Current Outpatient Medications  Medication Sig Dispense Refill  acetaZOLAMIDE (DIAMOX) 250 MG tablet TAKE 1 TABLET BY MOUTH EVERY DAY 90 tablet 0   ADCIRCA 20 MG tablet TAKE 2 TABLETS BY MOUTH ONCE DAILY. 60 tablet 4   allopurinol (ZYLOPRIM) 100 MG tablet TAKE 1 TABLET BY MOUTH TWICE A DAY 180 tablet 1   atenolol (TENORMIN) 50 MG tablet TAKE 1 TABLET (50 MG TOTAL) BY MOUTH DAILY. MUST BE SEEN FOR FURTHER REFILLS 90 tablet 0   Cholecalciferol (VITAMIN D) 125 MCG (5000 UT) CAPS Take  5,000 Units by mouth 3 (three) times a week.      diclofenac sodium (VOLTAREN) 1 % GEL Apply 2 g topically 2 (two) times daily as needed (for arthritis).     empagliflozin (JARDIANCE) 10 MG TABS tablet Take 1 tablet (10 mg total) by mouth daily. 30 tablet 11   Ferrous Sulfate (SLOW FE PO) Take 1 tablet by mouth 3 (three) times a week.     furosemide (LASIX) 20 MG tablet Take 40 mg by mouth daily. 20 mg in the afternoon     Methylcobalamin (B12-ACTIVE PO) Take 1 tablet by mouth 3 (three) times a week.     potassium chloride (KLOR-CON) 10 MEQ tablet Take 2 tablets (20 mEq total) by mouth 2 (two) times daily. 120 tablet 11   rosuvastatin (CRESTOR) 5 MG tablet Take 1 tablet (5 mg total) by mouth daily. 30 tablet 11   traMADol (ULTRAM) 50 MG tablet Take 1 tablet (50 mg total) by mouth 3 (three) times daily as needed. 60 tablet 1   levothyroxine (SYNTHROID) 75 MCG tablet TAKE 1 TABLET BY MOUTH EVERY DAY 90 tablet 0   rivaroxaban (XARELTO) 20 MG TABS tablet TAKE 1 TABLET (20 MG TOTAL) BY MOUTH DAILY WITH SUPPER. 90 tablet 3   No current facility-administered medications for this encounter.    Allergies:   Ambien [zolpidem tartrate], Losartan, Prevacid [lansoprazole], Adhesive [tape], Keflex [cephalexin], Motrin [ibuprofen], Prilosec [omeprazole], Ace inhibitors, Benadryl [diphenhydramine hcl], and Spironolactone   Social History:  The patient  reports that she has never smoked. She has never used smokeless tobacco. She reports current alcohol use of about 1.0 standard drink per week. She reports that she does not use drugs.   Family History:  The patient's family history includes COPD in her mother; Diabetes in her father; Heart attack in her father; Hypertension in her brother, father, mother, and sister; Liver cancer in her father; Osteoporosis in her mother; Stroke in her maternal grandmother.   ROS:  Please see the history of present illness.   All other systems are personally reviewed and  negative.   Exam:   BP 140/70    Pulse 75    Wt 113.5 kg (250 lb 3.2 oz)    LMP 05/06/2013    SpO2 95% Comment: room Air   BMI 48.86 kg/m  General: NAD, obese.  Neck: No JVD, no thyromegaly or thyroid nodule.  Lungs: Clear to auscultation bilaterally with normal respiratory effort. CV: Nondisplaced PMI.  Heart regular S1/S2, no S3/S4, no murmur.  No peripheral edema.  No carotid bruit.  Normal pedal pulses.  Abdomen: Soft, nontender, no hepatosplenomegaly, no distention.  Skin: Intact without lesions or rashes.  Neurologic: Alert and oriented x 3.  Psych: Normal affect. Extremities: No clubbing or cyanosis.  HEENT: Normal.   Recent Labs: 12/14/2020: TSH 1.69 05/19/2021: ALT 8 10/04/2021: B Natriuretic Peptide 229.4; BUN 21; Creatinine, Ser 1.30; Hemoglobin 14.8; Platelets 131; Potassium 4.1; Sodium 143  Personally reviewed   Wt Readings from  Last 3 Encounters:  10/04/21 113.5 kg (250 lb 3.2 oz)  09/13/21 113.4 kg (250 lb)  05/31/21 115.1 kg (253 lb 12.8 oz)    ASSESSMENT AND PLAN:  1. Chronic diastolic CHF:  Associated with severe pulmonary hypertension and prominent RV failure.  NYHA class II symptoms, unchanged.  Most recent echo done today showed showed EF 60-65%, mild LVH, normal RV, mild RV enlargement, severe biatrial enlargement, mild MR, no significant aortic stenosis. On exam, she is not significantly volume overloaded.   - She can continue Lasix 40 qam/20 qpm.  BMET/BNP today.   - Continue diamox 250 mg daily.  - Continue Jardiance 10 mg daily.  2. Chronic hypercarbic/hypoxic respiratory failure with OHS/OSA:  Using bipap at night and oxygen during the day with exertion.   - Compliant with Bipap nightly.  - Continue regular exercise and efforts at weight loss. - Continue pulmonary followup.  3. Atrial fibrillation: Chronic, rate controlled  - Continue atenolol 50 mg daily.    - Continue Xarelto for anticoagulation.  CBC today.  4. Pulmonary hypertension: Mixed pulmonary  venous and pulmonary arterial hypertension.  PVR 5.7 WU by [place] in 3/18.  Suspect group 2 (elevated LA pressure) and group 3 (OHS/OSA) PH. However, cannot rule out group 1 component.  She had V/Q scan that was not suggestive of chronic PE and high resolution chest CT that was not suggestive of ILD.  ANA, RF, anti-SCL70 all negative.  She did not have a marked improvement from taking Adcirca => suspect predominantly group 2 and 3 PH.  Will hold off on additional pulmonary vasodilators. Echo today showed with mildly enlarged RV with normal systolic function, unable to estimate PA systolic pressure.  - Continue Adcirca 40 mg daily.  - Continue BiPAP at night for OSA and oxygen with exertion.  5. HTN: BP controlled generally.  6. Obesity: I will have her start semaglutide, will refer to pharmacy clinic for this.   Followup 6 months with APP.  Signed, Loralie Champagne, MD  10/04/2021  [facility] [address] [facility] [place] [address] [phone] (office) [phone] (fax)

## 2021-10-04 NOTE — Patient Instructions (Addendum)
Labs done today. We will contact you only if your labs are abnormal.  No medication changes were made. Please continue all current medications as prescribed.  You have been referred to Pharmacy Clinic for [facility]. They will contact you to schedule an appointment.   Your physician recommends that you schedule a follow-up appointment in: 6 months with our NP/PA Clinic here in our office.   If you have any questions or concerns before your next appointment please send [place] a message through [place] or call our office at [phone].    TO LEAVE A MESSAGE FOR THE NURSE SELECT OPTION 2, PLEASE LEAVE A MESSAGE INCLUDING: YOUR NAME DATE OF BIRTH CALL BACK NUMBER REASON FOR CALL**this is important as we prioritize the call backs  YOU WILL RECEIVE A CALL BACK THE SAME DAY AS LONG AS YOU CALL BEFORE 4:00 PM   Do the following things EVERYDAY: Weigh yourself in the morning before breakfast. Write it down and keep it in a log. Take your medicines as prescribed Eat low salt foods--Limit salt (sodium) to 2000 mg per day.  Stay as active as you can everyday Limit all fluids for the day to less than 2 liters   At the [facility], you and your health needs are our priority. As part of our continuing mission to provide you with exceptional heart care, we have created designated Provider Care Teams. These Care Teams include your primary Cardiologist (physician) and Advanced Practice Providers (APPs- Physician Assistants and Nurse Practitioners) who all work together to provide you with the care you need, when you need it.   You may see any of the following providers on your designated Care Team at your next follow up: Dr Glori Bickers Dr Haynes Kerns, NP Lyda Jester, [place] Audry Riles, PharmD   Please be sure to bring in all your medications bottles to every appointment.

## 2021-10-06 ENCOUNTER — Telehealth: Payer: Self-pay | Admitting: Pharmacist

## 2021-10-06 DIAGNOSIS — G4733 Obstructive sleep apnea (adult) (pediatric): Secondary | ICD-10-CM | POA: Diagnosis not present

## 2021-10-06 DIAGNOSIS — J9610 Chronic respiratory failure, unspecified whether with hypoxia or hypercapnia: Secondary | ICD-10-CM | POA: Diagnosis not present

## 2021-10-06 NOTE — Telephone Encounter (Signed)
Pt referred by Dr Aundra Dubin to initiate [id number] therapy for weight loss. She has Medicare insurance so Mancel Parsons is not covered. Will try submitting prior authorization for Ozempic although pt does not have DM so likely will not get approved, then follow up with pt once determination is made.

## 2021-10-11 NOTE — Telephone Encounter (Addendum)
Ozempic authorization denied since pt does not have diabetes. Spoke with pt who is aware her insurance will not cover Ozempic or [id number].

## 2021-10-18 NOTE — Telephone Encounter (Signed)
Advanced Heart Failure Patient Advocate Encounter  Advanced Heart Failure Patient Advocate Encounter   Patient was approved to receive Jardiance from [facility]  Effective dates: 09/05/21 through 09/05/22  Charlann Boxer, CPhT

## 2021-11-05 DIAGNOSIS — G4733 Obstructive sleep apnea (adult) (pediatric): Secondary | ICD-10-CM | POA: Diagnosis not present

## 2021-11-05 DIAGNOSIS — J9610 Chronic respiratory failure, unspecified whether with hypoxia or hypercapnia: Secondary | ICD-10-CM | POA: Diagnosis not present

## 2021-11-25 ENCOUNTER — Other Ambulatory Visit (HOSPITAL_COMMUNITY): Payer: Self-pay

## 2021-11-25 DIAGNOSIS — I5032 Chronic diastolic (congestive) heart failure: Secondary | ICD-10-CM

## 2021-12-01 ENCOUNTER — Telehealth: Payer: Self-pay | Admitting: Pharmacist

## 2021-12-01 NOTE — Chronic Care Management (AMB) (Signed)
? ? ?Chronic Care Management ?Pharmacy Assistant  ? ?Name: [name]  MRN: [id number] DOB: 12/18/1950 ? ?Reason for Encounter: Disease State / Hypertension Assessment Call ?  ?Conditions to be addressed/monitored: ?HTN ? ? ?Recent office visits:  ?None ? ?Recent consult visits:  ?09/13/2021 Sherrilyn Rist MD (pulmonary) - Patient was seen for OSA on CPAP and additional issues. No medication changes. Follow up 4-6 months.  ? ?Hospital visits:  ?None ? ?Medications: ?Outpatient Encounter Medications as of 12/01/2021  ?Medication Sig  ? acetaZOLAMIDE (DIAMOX) 250 MG tablet TAKE 1 TABLET BY MOUTH EVERY DAY  ? ADCIRCA 20 MG tablet TAKE 2 TABLETS BY MOUTH ONCE DAILY.  ? allopurinol (ZYLOPRIM) 100 MG tablet TAKE 1 TABLET BY MOUTH TWICE A DAY  ? atenolol (TENORMIN) 50 MG tablet TAKE 1 TABLET (50 MG TOTAL) BY MOUTH DAILY. MUST BE SEEN FOR FURTHER REFILLS  ? Cholecalciferol (VITAMIN D) 125 MCG (5000 UT) CAPS Take 5,000 Units by mouth 3 (three) times a week.   ? diclofenac sodium (VOLTAREN) 1 % GEL Apply 2 g topically 2 (two) times daily as needed (for arthritis).  ? empagliflozin (JARDIANCE) 10 MG TABS tablet Take 1 tablet (10 mg total) by mouth daily.  ? Ferrous Sulfate (SLOW FE PO) Take 1 tablet by mouth 3 (three) times a week.  ? furosemide (LASIX) 20 MG tablet Take 40 mg by mouth daily. 20 mg in the afternoon  ? levothyroxine (SYNTHROID) 75 MCG tablet TAKE 1 TABLET BY MOUTH EVERY DAY  ? Methylcobalamin (B12-ACTIVE PO) Take 1 tablet by mouth 3 (three) times a week.  ? potassium chloride (KLOR-CON) 10 MEQ tablet Take 2 tablets (20 mEq total) by mouth 2 (two) times daily.  ? rivaroxaban (XARELTO) 20 MG TABS tablet TAKE 1 TABLET (20 MG TOTAL) BY MOUTH DAILY WITH SUPPER.  ? rosuvastatin (CRESTOR) 5 MG tablet Take 1 tablet (5 mg total) by mouth daily.  ? traMADol (ULTRAM) 50 MG tablet Take 1 tablet (50 mg total) by mouth 3 (three) times daily as needed.  ? [DISCONTINUED] TOPAMAX 50 MG tablet Take 50 mg by mouth.   ? ?No facility-administered encounter medications on file as of 12/01/2021.  ?Fill History: ?ALLOPURINOL 100 MG TABLET 09/22/2021 90  ? ?ATENOLOL 50 MG TABLET 10/04/2021 90  ? ?LEVOTHYROXINE 75 MCG TABLET 10/04/2021 90  ? ?POTASSIUM CL ER 10 MEQ TABLET 09/17/2021 90  ? ?XARELTO 20 MG TABLET 10/04/2021 90  ? ?ROSUVASTATIN CALCIUM 5 MG TAB 09/13/2021 90  ? ?ACETAZOLAMIDE 250 MG TABLET 09/20/2021 90  ? ? ?Reviewed chart prior to disease state call. Spoke with patient regarding BP ? ?Recent Office Vitals: ?BP Readings from Last 3 Encounters:  ?10/04/21 140/70  ?09/13/21 120/74  ?05/31/21 128/70  ? ?Pulse Readings from Last 3 Encounters:  ?10/04/21 75  ?09/13/21 90  ?05/31/21 83  ?  ?Wt Readings from Last 3 Encounters:  ?10/04/21 250 lb 3.2 oz (113.5 kg)  ?09/13/21 250 lb (113.4 kg)  ?05/31/21 253 lb 12.8 oz (115.1 kg)  ?  ? ?Kidney Function ?Lab Results  ?Component Value Date/Time  ? CREATININE 1.30 (H) 10/04/2021 11:19 AM  ? CREATININE 1.34 (H) 05/19/2021 03:18 PM  ? CREATININE 1.01 (H) 07/05/2016 10:13 AM  ? CREATININE 0.90 09/29/2015 02:37 PM  ? GFR 39.83 (L) 05/19/2021 03:18 PM  ? GFRNONAA 44 (L) 10/04/2021 11:19 AM  ? GFRNONAA 58 (L) 07/05/2016 10:13 AM  ? GFRAA 47 (L) 09/19/2018 09:30 AM  ? GFRAA 67 07/05/2016 10:13 AM  ? ? ? ?  Latest Ref Rng & Units 10/04/2021  ? 11:19 AM 05/19/2021  ?  3:18 PM 01/01/2021  ? 10:32 AM  ?BMP  ?Glucose 70 - 99 mg/dL 100   89   97    ?BUN 8 - 23 mg/dL _0 ?Creatinine 0.44 - 1.00 mg/dL 1.30   1.34   1.20    ?Sodium 135 - 145 mmol/L 143   144   142    ?Potassium 3.5 - 5.1 mmol/L 4.1   3.9   4.0    ?Chloride 98 - 111 mmol/L 111   109   108    ?CO2 22 - 32 mmol/L _1 ?Calcium 8.9 - 10.3 mg/dL 9.3   9.1   9.4    ? ? ?Current antihypertensive regimen:  ?Atenolol 50 mg daily ? ?How often are you checking your Blood Pressure? Patient is checking her blood pressure weekly ? ?Current home BP readings: Patient states her blood pressures are around  115/60 and  120/72 ? ?What recent interventions/DTPs have been made by any provider to improve Blood Pressure control since last CPP Visit: No recent interventions ? ?Any recent hospitalizations or ED visits since last visit with CPP? No recent hospital visits.  ? ?What diet changes have been made to improve Blood Pressure Control?  ?Patient follows a low sodium diet ?Breakfast - patient will have breakfast late and have something light like toast or cottage cheese with fruit ?Lunch - patient will have lunch late and eat a lean cuisine or something light like a ham sandwich ?Dinner - patient will have a snack of some sort, she likes the packaged snacks that have meat cheese and nuts in them.  ? ?What exercise is being done to improve your Blood Pressure Control?  ?Patient will walk some and does her own housework, she states she is up and moving all day.  ? ?Adherence Review: ?Is the patient currently on ACE/ARB medication? No ?Does the patient have >5 day gap between last estimated fill dates? No ? ?Care Gaps: ?AWV - completed on 04/22/21 ?Last BP - 120/74 on 09/13/2021 ? ?Star Rating Drugs: ?Rosuvastatin 5m - [facility]/13/2021 [facility] ?Jardiance 10 mg - last filled 12/28/2020 14 [facility] ? ?JGennie AlmaCMA  ?Clinical Pharmacist Assistant ?[phone]? ?

## 2021-12-13 ENCOUNTER — Other Ambulatory Visit (HOSPITAL_COMMUNITY): Payer: Self-pay | Admitting: Cardiology

## 2021-12-17 ENCOUNTER — Other Ambulatory Visit: Payer: Self-pay | Admitting: Internal Medicine

## 2021-12-20 ENCOUNTER — Telehealth: Payer: Self-pay | Admitting: Gastroenterology

## 2021-12-20 NOTE — Telephone Encounter (Signed)
The pt states that she has some left lower abd discomfort that comes and goes over the past 2 days.  NO rectal bleeding or mucous. Her bowels are normal for her.  She has no history of diverticulitis.  She says her brother n law has it and the symptoms sound the same.  She has been scheduled to see Anderson Malta on 4/20 at 930 am.  She has been told to call if her symptoms worsen.   ?

## 2021-12-20 NOTE — Telephone Encounter (Signed)
Inbound call from patient reports she is having a diverticulitis flare. Is experiencing pain in lower abd ?

## 2021-12-23 ENCOUNTER — Encounter: Payer: Self-pay | Admitting: Physician Assistant

## 2021-12-23 ENCOUNTER — Other Ambulatory Visit (INDEPENDENT_AMBULATORY_CARE_PROVIDER_SITE_OTHER): Payer: PPO

## 2021-12-23 ENCOUNTER — Ambulatory Visit: Payer: PPO | Admitting: Physician Assistant

## 2021-12-23 VITALS — BP 122/80 | HR 97 | Ht 59.0 in | Wt 251.1 lb

## 2021-12-23 DIAGNOSIS — K649 Unspecified hemorrhoids: Secondary | ICD-10-CM | POA: Diagnosis not present

## 2021-12-23 DIAGNOSIS — R1032 Left lower quadrant pain: Secondary | ICD-10-CM | POA: Diagnosis not present

## 2021-12-23 LAB — COMPREHENSIVE METABOLIC PANEL
ALT: 7 U/L (ref 0–35)
AST: 16 U/L (ref 0–37)
Albumin: 4.1 g/dL (ref 3.5–5.2)
Alkaline Phosphatase: 89 U/L (ref 39–117)
BUN: 20 mg/dL (ref 6–23)
CO2: 24 mEq/L (ref 19–32)
Calcium: 9.6 mg/dL (ref 8.4–10.5)
Chloride: 110 mEq/L (ref 96–112)
Creatinine, Ser: 1.28 mg/dL — ABNORMAL HIGH (ref 0.40–1.20)
GFR: 41.9 mL/min — ABNORMAL LOW (ref >60.00)
Glucose, Bld: 96 mg/dL (ref 70–99)
Potassium: 5.1 mEq/L (ref 3.5–5.1)
Sodium: 142 mEq/L (ref 135–145)
Total Bilirubin: 0.6 mg/dL (ref 0.2–1.2)
Total Protein: 7 g/dL (ref 6.0–8.3)

## 2021-12-23 LAB — CBC WITH DIFFERENTIAL/PLATELET
Basophils Absolute: 0 10*3/uL (ref 0.0–0.1)
Basophils Relative: 0.3 % (ref 0.0–3.0)
Eosinophils Absolute: 0.2 10*3/uL (ref 0.0–0.7)
Eosinophils Relative: 3 % (ref 0.0–5.0)
HCT: 44.7 % (ref 36.0–46.0)
Hemoglobin: 14.5 g/dL (ref 12.0–15.0)
Lymphocytes Relative: 12.5 % (ref 12.0–46.0)
Lymphs Abs: 0.9 10*3/uL (ref 0.7–4.0)
MCHC: 32.4 g/dL (ref 30.0–36.0)
MCV: 100.5 fl — ABNORMAL HIGH (ref 78.0–100.0)
Monocytes Absolute: 0.5 10*3/uL (ref 0.1–1.0)
Monocytes Relative: 6.2 % (ref 3.0–12.0)
Neutro Abs: 5.7 10*3/uL (ref 1.4–7.7)
Neutrophils Relative %: 78 % — ABNORMAL HIGH (ref 43.0–77.0)
Platelets: 152 10*3/uL (ref 150.0–400.0)
RBC: 4.44 Mil/uL (ref 3.87–5.11)
RDW: 15.1 % (ref 11.5–15.5)
WBC: 7.4 10*3/uL (ref 4.0–10.5)

## 2021-12-23 MED ORDER — HYDROCORTISONE 1 % EX OINT: 1.0000 "application " | TOPICAL_OINTMENT | Freq: Two times a day (BID) | CUTANEOUS | 0 refills | Status: DC

## 2021-12-23 NOTE — Patient Instructions (Signed)
If you are age 73 or older, your body mass index should be between 23-30. Your Body mass index is 50.72 kg/m?Marland Kitchen If this is out of the aforementioned range listed, please consider follow up with your Primary Care Provider. ? ?If you are age 53 or younger, your body mass index should be between 19-25. Your Body mass index is 50.72 kg/m?Marland Kitchen If this is out of the aformentioned range listed, please consider follow up with your Primary Care Provider.  ? ?________________________________________________________ ? ?The [facility] providers would like to encourage you to use [facility] to communicate with providers for non-urgent requests or questions.  Due to long hold times on the telephone, sending your provider a message by [facility] may be a faster and more efficient way to get a response.  Please allow 48 business hours for a response.  Please remember that this is for non-urgent requests.  ?_______________________________________________________ ? ?You have been scheduled for a CT scan of the abdomen and pelvis at [facility] ([address]---this is in the same building as [facility]).  ? ?You are scheduled on 12-24-2021 at 1:45pm. You should arrive 15 minutes prior to your appointment time for registration. Please follow the written instructions below on the day of your exam: ? ?WARNING: IF YOU ARE ALLERGIC TO IODINE/X-RAY DYE, PLEASE NOTIFY RADIOLOGY IMMEDIATELY AT [phone]! YOU WILL BE GIVEN A 13 HOUR PREMEDICATION PREP. ? ?1) Do not eat or drink anything after 9:45am (4 hours prior to your test) ?2) You have been given 2 bottles of oral contrast to drink. The solution may taste better if refrigerated, but do NOT add ice or any other liquid to this solution. Shake well before drinking. ?  ? Drink 1 bottle of contrast @ 11:45am (2 hours prior to your exam) ? Drink 1 bottle of contrast @ 12:45pm (1 hour prior to your exam) ? ?You may take any medications as prescribed with a small  amount of water, if necessary. If you take any of the following medications: METFORMIN, GLUCOPHAGE, GLUCOVANCE, AVANDAMET, RIOMET, FORTAMET, ACTOPLUS MET, JANUMET, [place] or METAGLIP, you MAY be asked to HOLD this medication 48 hours AFTER the exam. ? ?The purpose of you drinking the oral contrast is to aid in the visualization of your intestinal tract. The contrast solution may cause some diarrhea. Depending on your individual set of symptoms, you may also receive an intravenous injection of x-ray contrast/dye. Plan on being at [facility] for 30 minutes or longer, depending on the type of exam you are having performed. ? ?This test typically takes 30-45 minutes to complete. ? ?If you have any questions regarding your exam or if you need to reschedule, you may call the CT department at [phone] between the hours of 8:00 am and 5:00 pm, Monday-Friday. ? ?Your provider has requested that you go to the basement level for lab work before leaving today. Press "B" on the elevator. The lab is located at the first door on the left as you exit the elevator. ? ?Use hydrocortisone ointment twice a day for 1-2 weeks. ? ?It was a pleasure to see you today! ? ?Thank you for trusting me with your gastrointestinal care!   ? ? ? ?________________________________________________________________________  ?

## 2021-12-23 NOTE — Progress Notes (Signed)
Reviewed and agree with management plans. ? ?Angeliki Mates L. Tarri Glenn, MD, MPH  ?
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
## 2021-12-24 ENCOUNTER — Ambulatory Visit (INDEPENDENT_AMBULATORY_CARE_PROVIDER_SITE_OTHER)
Admission: RE | Admit: 2021-12-24 | Discharge: 2021-12-24 | Disposition: A | Discharge status: Home / Self Care | Payer: PPO | Source: Ambulatory Visit | Attending: Physician Assistant | Admitting: Physician Assistant

## 2021-12-24 ENCOUNTER — Other Ambulatory Visit: Payer: Self-pay

## 2021-12-24 DIAGNOSIS — R1032 Left lower quadrant pain: Secondary | ICD-10-CM | POA: Diagnosis not present

## 2021-12-24 DIAGNOSIS — K649 Unspecified hemorrhoids: Secondary | ICD-10-CM

## 2021-12-24 DIAGNOSIS — R161 Splenomegaly, not elsewhere classified: Secondary | ICD-10-CM | POA: Diagnosis not present

## 2021-12-24 DIAGNOSIS — K5730 Diverticulosis of large intestine without perforation or abscess without bleeding: Secondary | ICD-10-CM | POA: Diagnosis not present

## 2021-12-24 MED ORDER — IOHEXOL 300 MG/ML  SOLN
80.0000 mL | Freq: Once | INTRAMUSCULAR | Status: AC | PRN
  Administered 2021-12-24: 80 mL via INTRAVENOUS

## 2021-12-24 MED ORDER — CIPROFLOXACIN HCL 500 MG PO TABS: 500.0000 mg | ORAL_TABLET | Freq: Two times a day (BID) | ORAL | 0 refills | Status: AC

## 2021-12-24 MED ORDER — METRONIDAZOLE 500 MG PO TABS: 500.0000 mg | ORAL_TABLET | Freq: Three times a day (TID) | ORAL | 0 refills | Status: AC

## 2021-12-25 ENCOUNTER — Other Ambulatory Visit (HOSPITAL_COMMUNITY): Payer: Self-pay | Admitting: Cardiology
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
## 2021-12-28 ENCOUNTER — Encounter: Payer: Self-pay | Admitting: Internal Medicine

## 2021-12-28 ENCOUNTER — Ambulatory Visit (INDEPENDENT_AMBULATORY_CARE_PROVIDER_SITE_OTHER): Payer: PPO | Admitting: Internal Medicine

## 2021-12-28 VITALS — BP 118/82 | HR 73 | Temp 98.4°F | Ht 59.0 in | Wt 250.4 lb

## 2021-12-28 DIAGNOSIS — Z7901 Long term (current) use of anticoagulants: Secondary | ICD-10-CM | POA: Diagnosis not present

## 2021-12-28 DIAGNOSIS — E039 Hypothyroidism, unspecified: Secondary | ICD-10-CM | POA: Diagnosis not present

## 2021-12-28 DIAGNOSIS — M545 Low back pain, unspecified: Secondary | ICD-10-CM

## 2021-12-28 DIAGNOSIS — Z79899 Other long term (current) drug therapy: Secondary | ICD-10-CM | POA: Diagnosis not present

## 2021-12-28 DIAGNOSIS — D519 Vitamin B12 deficiency anemia, unspecified: Secondary | ICD-10-CM

## 2021-12-28 DIAGNOSIS — E785 Hyperlipidemia, unspecified: Secondary | ICD-10-CM

## 2021-12-28 DIAGNOSIS — G8929 Other chronic pain: Secondary | ICD-10-CM

## 2021-12-28 DIAGNOSIS — Z8739 Personal history of other diseases of the musculoskeletal system and connective tissue: Secondary | ICD-10-CM

## 2021-12-28 DIAGNOSIS — Z0000 Encounter for general adult medical examination without abnormal findings: Secondary | ICD-10-CM

## 2021-12-28 MED ORDER — TRAMADOL HCL 50 MG PO TABS: 50.0000 mg | ORAL_TABLET | Freq: Three times a day (TID) | ORAL | 1 refills | Status: DC | PRN

## 2021-12-28 NOTE — Patient Instructions (Signed)
Good to see  you today . ?Refilled tramadol.  Use with caution.  ?Plan labs  in about 2 months  don't have to fast  .  TSH  and uric acid . ? ? ? ? ? ? ? ? ?

## 2021-12-29 ENCOUNTER — Emergency Department (HOSPITAL_COMMUNITY)
Admission: EM | Admit: 2021-12-29 | Discharge: 2021-12-29 | Disposition: A | Discharge status: Home / Self Care | Payer: PPO | Attending: Emergency Medicine | Admitting: Emergency Medicine

## 2021-12-29 ENCOUNTER — Encounter (HOSPITAL_COMMUNITY): Payer: Self-pay

## 2021-12-29 ENCOUNTER — Emergency Department (HOSPITAL_COMMUNITY): Payer: PPO

## 2021-12-29 ENCOUNTER — Other Ambulatory Visit: Payer: Self-pay

## 2021-12-29 DIAGNOSIS — Z96642 Presence of left artificial hip joint: Secondary | ICD-10-CM | POA: Diagnosis not present

## 2021-12-29 DIAGNOSIS — Y92410 Unspecified street and highway as the place of occurrence of the external cause: Secondary | ICD-10-CM | POA: Diagnosis not present

## 2021-12-29 DIAGNOSIS — S7012XA Contusion of left thigh, initial encounter: Secondary | ICD-10-CM | POA: Diagnosis not present

## 2021-12-29 DIAGNOSIS — R102 Pelvic and perineal pain: Secondary | ICD-10-CM | POA: Diagnosis not present

## 2021-12-29 DIAGNOSIS — M79652 Pain in left thigh: Secondary | ICD-10-CM | POA: Diagnosis not present

## 2021-12-29 DIAGNOSIS — S79922A Unspecified injury of left thigh, initial encounter: Secondary | ICD-10-CM | POA: Diagnosis present
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
## 2021-12-29 NOTE — Discharge Instructions (Addendum)
Please refer to the attached instructions. Return if you develop dizziness or shortness of breath. ?
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
## 2022-01-10 DIAGNOSIS — G4733 Obstructive sleep apnea (adult) (pediatric): Secondary | ICD-10-CM | POA: Diagnosis not present

## 2022-01-10 DIAGNOSIS — J9610 Chronic respiratory failure, unspecified whether with hypoxia or hypercapnia: Secondary | ICD-10-CM | POA: Diagnosis not present

## 2022-03-09 ENCOUNTER — Other Ambulatory Visit: Payer: Self-pay | Admitting: Internal Medicine

## 2022-03-10 ENCOUNTER — Other Ambulatory Visit (HOSPITAL_COMMUNITY): Payer: Self-pay | Admitting: Cardiology

## 2022-03-16 ENCOUNTER — Other Ambulatory Visit (INDEPENDENT_AMBULATORY_CARE_PROVIDER_SITE_OTHER): Payer: PPO

## 2022-03-16 DIAGNOSIS — D519 Vitamin B12 deficiency anemia, unspecified: Secondary | ICD-10-CM | POA: Diagnosis not present

## 2022-03-16 DIAGNOSIS — E785 Hyperlipidemia, unspecified: Secondary | ICD-10-CM

## 2022-03-16 DIAGNOSIS — E039 Hypothyroidism, unspecified: Secondary | ICD-10-CM | POA: Diagnosis not present

## 2022-03-16 DIAGNOSIS — Z79899 Other long term (current) drug therapy: Secondary | ICD-10-CM

## 2022-03-16 DIAGNOSIS — Z8739 Personal history of other diseases of the musculoskeletal system and connective tissue: Secondary | ICD-10-CM

## 2022-03-16 LAB — LIPID PANEL
Cholesterol: 150 mg/dL (ref 0–200)
HDL: 63.6 mg/dL (ref >39.00)
LDL Cholesterol: 65 mg/dL (ref 0–99)
NonHDL: 86.18
Total CHOL/HDL Ratio: 2
Triglycerides: 105 mg/dL (ref 0.0–149.0)
VLDL: 21 mg/dL (ref 0.0–40.0)

## 2022-03-16 LAB — VITAMIN B12: Vitamin B-12: >1504 pg/mL — ABNORMAL HIGH (ref 211–911)

## 2022-03-16 LAB — URIC ACID: Uric Acid, Serum: 3.3 mg/dL (ref 2.4–7.0)

## 2022-03-16 LAB — TSH: TSH: 2.24 u[IU]/mL (ref 0.35–5.50)

## 2022-03-17 NOTE — Progress Notes (Signed)
Vit b12 is high  can decrease dosing ov vit b12   Uric acid is low at goal  Cholesterol at goal  and thyroid in range

## 2022-03-20 ENCOUNTER — Other Ambulatory Visit: Payer: Self-pay | Admitting: Internal Medicine

## 2022-03-21 ENCOUNTER — Ambulatory Visit: Payer: PPO | Admitting: Pulmonary Disease

## 2022-03-22 ENCOUNTER — Telehealth: Payer: Self-pay | Admitting: Pharmacist
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
## 2022-03-24 ENCOUNTER — Encounter: Payer: Self-pay | Admitting: Adult Health

## 2022-03-24 ENCOUNTER — Ambulatory Visit: Payer: PPO | Admitting: Adult Health

## 2022-03-24 DIAGNOSIS — G4733 Obstructive sleep apnea (adult) (pediatric): Secondary | ICD-10-CM

## 2022-03-24 DIAGNOSIS — E662 Morbid (severe) obesity with alveolar hypoventilation: Secondary | ICD-10-CM

## 2022-03-24 DIAGNOSIS — J9611 Chronic respiratory failure with hypoxia: Secondary | ICD-10-CM | POA: Diagnosis not present

## 2022-03-24 DIAGNOSIS — I5032 Chronic diastolic (congestive) heart failure: Secondary | ICD-10-CM | POA: Diagnosis not present

## 2022-03-24 NOTE — Assessment & Plan Note (Signed)
Continue on CPAP at bedtime

## 2022-03-24 NOTE — Assessment & Plan Note (Signed)
Appears compensated without evidence of volume overload on exam.  Continue current regimen
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
## 2022-03-24 NOTE — Assessment & Plan Note (Signed)
Excellent control and compliance on nocturnal CPAP.  Continue current settings  Plan  Patient Instructions  Continue on CPAP  At bedtime  Continue on Oxygen 2l/m with activity As needed  , goal is for Oxygen level >90%  Work on healthy weight  Do not drive if sleepy.  Continue with follow up with Cardiology as planned.  Continue on Furosemide and Diamox .  Low salt diet.  Continue on Adcirca  Follow up with Dr Hermina Staggers Royal Piedra NP in 6 months.  Please contact office for sooner follow up if symptoms do not improve or worsen or seek emergency care

## 2022-03-24 NOTE — Assessment & Plan Note (Signed)
Continue on oxygen with activity as needed to maintain O2 saturations greater than 88 to 90%

## 2022-03-24 NOTE — Patient Instructions (Addendum)
Continue on CPAP  At bedtime  Continue on Oxygen 2l/m with activity As needed  , goal is for Oxygen level >90%  Work on healthy weight  Do not drive if sleepy.  Continue with follow up with Cardiology as planned.  Continue on Furosemide and Diamox .  Low salt diet.  Continue on Adcirca  Follow up with Dr Hermina Staggers Royal Piedra NP in 6 months.  Please contact office for sooner follow up if symptoms do not improve or worsen or seek emergency care

## 2022-04-04 ENCOUNTER — Encounter (HOSPITAL_COMMUNITY): Payer: PPO

## 2022-04-11 ENCOUNTER — Ambulatory Visit (INDEPENDENT_AMBULATORY_CARE_PROVIDER_SITE_OTHER): Payer: PPO | Admitting: Internal Medicine

## 2022-04-11 ENCOUNTER — Encounter: Payer: Self-pay | Admitting: Internal Medicine

## 2022-04-11 VITALS — BP 130/70 | HR 73 | Temp 97.5°F | Wt 244.1 lb

## 2022-04-11 DIAGNOSIS — N3001 Acute cystitis with hematuria: Secondary | ICD-10-CM

## 2022-04-11 LAB — POCT URINALYSIS DIPSTICK
Bilirubin, UA: POSITIVE
Blood, UA: POSITIVE
Glucose, UA: POSITIVE — AB
Ketones, UA: NEGATIVE
Nitrite, UA: POSITIVE
Protein, UA: NEGATIVE
Spec Grav, UA: 1.015 (ref 1.010–1.025)
Urobilinogen, UA: 1 E.U./dL
pH, UA: 5.5 (ref 5.0–8.0)

## 2022-04-11 MED ORDER — SULFAMETHOXAZOLE-TRIMETHOPRIM 800-160 MG PO TABS: 1.0000 | ORAL_TABLET | Freq: Two times a day (BID) | ORAL | 0 refills | Status: AC
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
## 2022-04-12 ENCOUNTER — Ambulatory Visit: Payer: PPO | Admitting: Family Medicine

## 2022-04-12 LAB — URINALYSIS, ROUTINE W REFLEX MICROSCOPIC
Bilirubin Urine: NEGATIVE
Ketones, ur: NEGATIVE
Nitrite: POSITIVE — AB
Specific Gravity, Urine: 1.015 (ref 1.000–1.030)
Total Protein, Urine: NEGATIVE
Urine Glucose: 250 — AB
Urobilinogen, UA: 1 (ref 0.0–1.0)
pH: 5.5 (ref 5.0–8.0)

## 2022-04-14 DIAGNOSIS — G4733 Obstructive sleep apnea (adult) (pediatric): Secondary | ICD-10-CM | POA: Diagnosis not present

## 2022-04-14 DIAGNOSIS — J9610 Chronic respiratory failure, unspecified whether with hypoxia or hypercapnia: Secondary | ICD-10-CM | POA: Diagnosis not present

## 2022-04-14 LAB — URINE CULTURE
MICRO NUMBER:: 13744844
SPECIMEN QUALITY:: ADEQUATE

## 2022-04-15 ENCOUNTER — Telehealth: Payer: Self-pay | Admitting: Internal Medicine

## 2022-04-15 NOTE — Telephone Encounter (Signed)
Left message for patient to call back and schedule Medicare Annual Wellness Visit (AWV) either virtually or in office. Left  my Herbie Drape number [phone]   Last AWV 04/22/21   please schedule at anytime with [facility] Nurse Health Advisor 1 or 2

## 2022-04-25 ENCOUNTER — Ambulatory Visit (INDEPENDENT_AMBULATORY_CARE_PROVIDER_SITE_OTHER): Payer: PPO

## 2022-04-25 VITALS — BP 110/68 | HR 66 | Ht 60.0 in | Wt 239.0 lb

## 2022-04-25 DIAGNOSIS — Z0000 Encounter for general adult medical examination without abnormal findings: Secondary | ICD-10-CM | POA: Diagnosis not present

## 2022-04-25 NOTE — Patient Instructions (Signed)
[name] , Thank you for taking time to come for your Medicare Wellness Visit. I appreciate your ongoing commitment to your health goals. Please review the following plan we discussed and let me know if I can assist you in the future.   Screening recommendations/referrals: Colonoscopy: cologuard 03/29/2020, due 03/30/2023 Mammogram: patient to schedule Bone Density: completed 04/19/2021 Recommended yearly ophthalmology/optometry visit for glaucoma screening and checkup Recommended yearly dental visit for hygiene and checkup  Vaccinations: Influenza vaccine: due Pneumococcal vaccine: completed 04/22/2015 Tdap vaccine: completed 07/16/2013, due 07/17/2023 Shingles vaccine: completed   Covid-19: 05/26/2021, 12/12/2020, 05/25/2020, 10/14/2019, 09/26/2019  Advanced directives: Please bring a copy of your POA (Power of Attorney) and/or Living Will to your next appointment.   Conditions/risks identified: none  Next appointment: Follow up in one year for your annual wellness visit    Preventive Care 65 Years and Older, Female Preventive care refers to lifestyle choices and visits with your health care provider that can promote health and wellness. What does preventive care include? A yearly physical exam. This is also called an annual well check. Dental exams once or twice a year. Routine eye exams. Ask your health care provider how often you should have your eyes checked. Personal lifestyle choices, including: Daily care of your teeth and gums. Regular physical activity. Eating a healthy diet. Avoiding tobacco and drug use. Limiting alcohol use. Practicing safe sex. Taking low-dose aspirin every day. Taking vitamin and mineral supplements as recommended by your health care provider. What happens during an annual well check? The services and screenings done by your health care provider during your annual well check will depend on your age, overall health, lifestyle risk factors, and family  history of disease. Counseling  Your health care provider may ask you questions about your: Alcohol use. Tobacco use. Drug use. Emotional well-being. Home and relationship well-being. Sexual activity. Eating habits. History of falls. Memory and ability to understand (cognition). Work and work Statistician. Reproductive health. Screening  You may have the following tests or measurements: Height, weight, and BMI. Blood pressure. Lipid and cholesterol levels. These may be checked every 5 years, or more frequently if you are over 12 years old. Skin check. Lung cancer screening. You may have this screening every year starting at age 61 if you have a 30-pack-year history of smoking and currently smoke or have quit within the past 15 years. Fecal occult blood test (FOBT) of the stool. You may have this test every year starting at age 47. Flexible sigmoidoscopy or colonoscopy. You may have a sigmoidoscopy every 5 years or a colonoscopy every 10 years starting at age 24. Hepatitis C blood test. Hepatitis B blood test. Sexually transmitted disease (STD) testing. Diabetes screening. This is done by checking your blood sugar (glucose) after you have not eaten for a while (fasting). You may have this done every 1-3 years. Bone density scan. This is done to screen for osteoporosis. You may have this done starting at age 53. Mammogram. This may be done every 1-2 years. Talk to your health care provider about how often you should have regular mammograms. Talk with your health care provider about your test results, treatment options, and if necessary, the need for more tests. Vaccines  Your health care provider may recommend certain vaccines, such as: Influenza vaccine. This is recommended every year. Tetanus, diphtheria, and acellular pertussis (Tdap, Td) vaccine. You may need a Td booster every 10 years. Zoster vaccine. You may need this after age 35. Pneumococcal 13-valent conjugate (  PCV13)  vaccine. One dose is recommended after age 8. Pneumococcal polysaccharide (PPSV23) vaccine. One dose is recommended after age 48. Talk to your health care provider about which screenings and vaccines you need and how often you need them. This information is not intended to replace advice given to you by your health care provider. Make sure you discuss any questions you have with your health care provider. Document Released: 09/18/2015 Document Revised: 05/11/2016 Document Reviewed: 06/23/2015 Elsevier Interactive Patient Education  2017 [employer] Prevention in the Home Falls can cause injuries. They can happen to people of all ages. There are many things you can do to make your home safe and to help prevent falls. What can I do on the outside of my home? Regularly fix the edges of walkways and driveways and fix any cracks. Remove anything that might make you trip as you walk through a door, such as a raised step or threshold. Trim any bushes or trees on the path to your home. Use bright outdoor lighting. Clear any walking paths of anything that might make someone trip, such as rocks or tools. Regularly check to see if handrails are loose or broken. Make sure that both sides of any steps have handrails. Any raised decks and porches should have guardrails on the edges. Have any leaves, snow, or ice cleared regularly. Use sand or salt on walking paths during winter. Clean up any spills in your garage right away. This includes oil or grease spills. What can I do in the bathroom? Use night lights. Install grab bars by the toilet and in the tub and shower. Do not use towel bars as grab bars. Use non-skid mats or decals in the tub or shower. If you need to sit down in the shower, use a plastic, non-slip stool. Keep the floor dry. Clean up any water that spills on the floor as soon as it happens. Remove soap buildup in the tub or shower regularly. Attach bath mats securely with  double-sided non-slip rug tape. Do not have throw rugs and other things on the floor that can make you trip. What can I do in the bedroom? Use night lights. Make sure that you have a light by your bed that is easy to reach. Do not use any sheets or blankets that are too big for your bed. They should not hang down onto the floor. Have a firm chair that has side arms. You can use this for support while you get dressed. Do not have throw rugs and other things on the floor that can make you trip. What can I do in the kitchen? Clean up any spills right away. Avoid walking on wet floors. Keep items that you use a lot in easy-to-reach places. If you need to reach something above you, use a strong step stool that has a grab bar. Keep electrical cords out of the way. Do not use floor polish or wax that makes floors slippery. If you must use wax, use non-skid floor wax. Do not have throw rugs and other things on the floor that can make you trip. What can I do with my stairs? Do not leave any items on the stairs. Make sure that there are handrails on both sides of the stairs and use them. Fix handrails that are broken or loose. Make sure that handrails are as long as the stairways. Check any carpeting to make sure that it is firmly attached to the stairs. Fix any carpet that is  loose or worn. Avoid having throw rugs at the top or bottom of the stairs. If you do have throw rugs, attach them to the floor with carpet tape. Make sure that you have a light switch at the top of the stairs and the bottom of the stairs. If you do not have them, ask someone to add them for you. What else can I do to help prevent falls? Wear shoes that: Do not have high heels. Have rubber bottoms. Are comfortable and fit you well. Are closed at the toe. Do not wear sandals. If you use a stepladder: Make sure that it is fully opened. Do not climb a closed stepladder. Make sure that both sides of the stepladder are locked  into place. Ask someone to hold it for you, if possible. Clearly mark and make sure that you can see: Any grab bars or handrails. First and last steps. Where the edge of each step is. Use tools that help you move around (mobility aids) if they are needed. These include: Canes. Walkers. Scooters. Crutches. Turn on the lights when you go into a dark area. Replace any light bulbs as soon as they burn out. Set up your furniture so you have a clear path. Avoid moving your furniture around. If any of your floors are uneven, fix them. If there are any pets around you, be aware of where they are. Review your medicines with your doctor. Some medicines can make you feel dizzy. This can increase your chance of falling. Ask your doctor what other things that you can do to help prevent falls. This information is not intended to replace advice given to you by your health care provider. Make sure you discuss any questions you have with your health care provider. Document Released: 06/18/2009 Document Revised: 01/28/2016 Document Reviewed: 09/26/2014 Elsevier Interactive Patient Education  2017 [employer].
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
## 2022-04-29 ENCOUNTER — Encounter (HOSPITAL_COMMUNITY): Payer: Self-pay

## 2022-04-29 ENCOUNTER — Ambulatory Visit (HOSPITAL_COMMUNITY)
Admission: RE | Admit: 2022-04-29 | Discharge: 2022-04-29 | Disposition: A | Discharge status: Home / Self Care | Payer: PPO | Source: Ambulatory Visit | Attending: Family Medicine | Admitting: Family Medicine

## 2022-04-29 VITALS — BP 112/76 | HR 64 | Wt 240.0 lb

## 2022-04-29 DIAGNOSIS — J9610 Chronic respiratory failure, unspecified whether with hypoxia or hypercapnia: Secondary | ICD-10-CM | POA: Diagnosis not present

## 2022-04-29 DIAGNOSIS — Z8673 Personal history of transient ischemic attack (TIA), and cerebral infarction without residual deficits: Secondary | ICD-10-CM | POA: Insufficient documentation

## 2022-04-29 DIAGNOSIS — J9612 Chronic respiratory failure with hypercapnia: Secondary | ICD-10-CM | POA: Insufficient documentation

## 2022-04-29 DIAGNOSIS — Z7984 Long term (current) use of oral hypoglycemic drugs: Secondary | ICD-10-CM | POA: Diagnosis not present

## 2022-04-29 DIAGNOSIS — J9611 Chronic respiratory failure with hypoxia: Secondary | ICD-10-CM | POA: Insufficient documentation

## 2022-04-29 DIAGNOSIS — I482 Chronic atrial fibrillation, unspecified: Secondary | ICD-10-CM

## 2022-04-29 DIAGNOSIS — Z6841 Body Mass Index (BMI) 40.0 and over, adult: Secondary | ICD-10-CM | POA: Diagnosis not present

## 2022-04-29 DIAGNOSIS — I5032 Chronic diastolic (congestive) heart failure: Secondary | ICD-10-CM

## 2022-04-29 DIAGNOSIS — I2721 Secondary pulmonary arterial hypertension: Secondary | ICD-10-CM | POA: Diagnosis not present

## 2022-04-29 DIAGNOSIS — G4733 Obstructive sleep apnea (adult) (pediatric): Secondary | ICD-10-CM | POA: Diagnosis not present

## 2022-04-29 DIAGNOSIS — I110 Hypertensive heart disease with heart failure: Secondary | ICD-10-CM | POA: Insufficient documentation

## 2022-04-29 DIAGNOSIS — I272 Pulmonary hypertension, unspecified: Secondary | ICD-10-CM | POA: Diagnosis not present

## 2022-04-29 DIAGNOSIS — R2689 Other abnormalities of gait and mobility: Secondary | ICD-10-CM | POA: Diagnosis not present

## 2022-04-29 DIAGNOSIS — Z7901 Long term (current) use of anticoagulants: Secondary | ICD-10-CM | POA: Diagnosis not present

## 2022-04-29 DIAGNOSIS — Z79899 Other long term (current) drug therapy: Secondary | ICD-10-CM | POA: Insufficient documentation

## 2022-04-29 LAB — BASIC METABOLIC PANEL
Anion gap: 7 (ref 5–15)
BUN: 28 mg/dL — ABNORMAL HIGH (ref 8–23)
CO2: 23 mmol/L (ref 22–32)
Calcium: 9.2 mg/dL (ref 8.9–10.3)
Chloride: 111 mmol/L (ref 98–111)
Creatinine, Ser: 1.2 mg/dL — ABNORMAL HIGH (ref 0.44–1.00)
GFR, Estimated: 48 mL/min — ABNORMAL LOW (ref >60)
Glucose, Bld: 95 mg/dL (ref 70–99)
Potassium: 4.2 mmol/L (ref 3.5–5.1)
Sodium: 141 mmol/L (ref 135–145)

## 2022-04-29 LAB — BRAIN NATRIURETIC PEPTIDE: B Natriuretic Peptide: 185.3 pg/mL — ABNORMAL HIGH (ref 0.0–100.0)

## 2022-04-29 NOTE — Progress Notes (Signed)
Date:  04/29/2022   ID:  [name] [birth year], MRN [id number]   Provider location: [facility] Type of Visit: Established patient   PCP:  [name], MD  Cardiologist:  [name]  History of Present Illness: [name] is a 71 y.o. female with PMH of pulmonary HTN, morbid obesity, chronic afib (on Xarelto, CVA in 2012, OHS/OSA (Had U PE3 surgery, and chronic respiratory failure with hypoxemia on continuous 02 at 2 lpm via [place]).   Admitted 3/2 ->05/16/67 with A/C diastolic CHF and A/C respiratory failure. Pt initially refused Bipap so venti mask used. Eventually tolerated transition to BiPAP. Overall pt diuresed from 285 lbs down to 229 lbs with 38 L of diuresis.  Echo 11/07/16 EF 55-60%, PASP 7m Hg, mildly dilated RV with moderate to severely decreased RV systolic function. RHC/LHC in 3/18 showed no coronary disease and confirmed severe PH.    Echo in 7/20 showed EF 60-65%, mild LVH, normal RV, PASP 38 mmHg, mild AS. Echo in 8/21 showed EF 60-65%, mildly decreased RV function, severe biatrial enlargement, small PFO, unable to estimate PA systolic pressure.   Echo 1/23, EF 60-65%, mild LVH, normal RV, mild RV enlargement, severe biatrial enlargement, mild MR, no significant aortic stenosis.    Today she returns for HF and pulmonary hypertension follow up. Overall feeling fine. She is down 10 lbs. She has dyspnea carrying groceries. She walks with a cane for short distances and uses a rolling walker for longer distances. Main complaint is balance issues. Denies palpitations, abnormal bleeding, CP, dizziness, edema, or PND/Orthopnea. Appetite ok. No fever or chills. Taking all medications. Wears CPAP at night.  ECG (personally reviewed): atrial fibrillation, 57 bpm   Labs (1/19): K 3.7, creatinine 1.37 Labs (11/19): K 3.9, creatinine 1.2, LDL 67, hgb 13.2 Labs (1/20): K 3.5, creatinine 1.34 Labs (5/20): K 3.9, creatinine 1.18, LDL  73 Labs (6/21): K 4.7, creatinine 1.19, LDL 71, hgb 13.7 Labs (11/21): K 4, creatinine 1.17, LDL 71 Labs (4/22): K 4.3, creatinine 1.34, LDL 59 Labs (9/22): K 3.9, creatinine 1.34, hgb 14.3 Labs (5/23): K 5.1, creatinine 1.28   PMH 1. Chronic diastolic CHF with severe pulmonary hypertension and prominent RV failure:  Echo (3/18) with EF 55-60%, PASP 983mHg, mildly dilated RV with moderate to severely decreased RV systolic function. - RHC/LHC 11/07/16: No angiographic CAD; PA 90/55, LVEDP 23 (PCWP inaccurate), CI 2.15 Fick/2.32 Thermo, shunt run negative, PVR 5.7 WU.  - Echo (7/20): EF 60-65%, mild LVH, normal RV, PASP 38 mmHg, mild AS.  - Echo (8/21): EF 60-65%, mildly decreased RV function, severe biatrial enlargement, small PFO, unable to estimate PA systolic pressure. - Echo (1/23): EF 60-65%, mild LVH, normal RV, mild RV enlargement, severe biatrial enlargement, mild MR, no significant aortic stenosis.  2. Chronic hypercarbic/hypoxic respiratory failure with OHS/OSA. Sees Dr. McLake BellsUsing nightly BiPAP and oxygen by nasal cannula during the day.  3. Atrial fibrillation: Chronic - Rate controlled on Xarelto + atenolol  4. 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  PVR 5.7 WU on RHC 3/18.  Suspect group 2 (elevated LA pressure) and group 3 (OHS/OSA) PH. However, cannot rule out group 1 component.  She had V/Q scan that was not suggestive of chronic PE and high resolution chest CT that was not suggestive of ILD.  ANA, RF, anti-SCL70 all negative.   5. H/o CVA 6. Hypothyroidism 7. H/o TKR 8. Aortic stenosis:  Mild on 7/20 echo. No evidence for significant stenosis on 1/23 echo.   Current Outpatient Medications  Medication Sig Dispense Refill   acetaZOLAMIDE (DIAMOX) 250 MG tablet TAKE 1 TABLET BY MOUTH EVERY DAY 90 tablet 1   ADCIRCA 20 MG tablet TAKE 2 TABLETS BY MOUTH ONCE DAILY. 60 tablet 4   allopurinol (ZYLOPRIM) 100 MG tablet TAKE 1 TABLET BY  MOUTH TWICE A DAY 180 tablet 1   atenolol (TENORMIN) 50 MG tablet TAKE 1 TABLET BY MOUTH EVERY DAY 90 tablet 3   Cholecalciferol (VITAMIN D) 125 MCG (5000 UT) CAPS Take 5,000 Units by mouth 3 (three) times a week.      diclofenac sodium (VOLTAREN) 1 % GEL Apply 2 g topically 2 (two) times daily as needed (for arthritis).     empagliflozin (JARDIANCE) 10 MG TABS tablet Take 1 tablet (10 mg total) by mouth daily. 30 tablet 11   Ferrous Sulfate (SLOW FE PO) Take 1 tablet by mouth 3 (three) times a week.     furosemide (LASIX) 20 MG tablet Take 40 mg by mouth daily. 20 mg in the afternoon     hydrocortisone 1 % ointment Apply 1 application. topically 2 (two) times daily. (Patient taking differently: Apply 1 application  topically as needed for itching.) 30 g 0   levothyroxine (SYNTHROID) 75 MCG tablet TAKE 1 TABLET BY MOUTH EVERY DAY 90 tablet 0   Methylcobalamin (B12-ACTIVE PO) Take 1 tablet by mouth 2 (two) times a week.     potassium chloride (KLOR-CON) 10 MEQ tablet Take 2 tablets (20 mEq total) by mouth 2 (two) times daily. 120 tablet 11   rivaroxaban (XARELTO) 20 MG TABS tablet TAKE 1 TABLET (20 MG TOTAL) BY MOUTH DAILY WITH SUPPER. 90 tablet 3   rosuvastatin (CRESTOR) 5 MG tablet Take 1 tablet (5 mg total) by mouth daily. 30 tablet 11   traMADol (ULTRAM) 50 MG tablet Take 1 tablet (50 mg total) by mouth 3 (three) times daily as needed. 60 tablet 1   No current facility-administered medications for this encounter.    Allergies:   Ambien [zolpidem tartrate], Losartan, Prevacid [lansoprazole], Adhesive [tape], Keflex [cephalexin], Motrin [ibuprofen], Prilosec [omeprazole], Ace inhibitors, Benadryl [diphenhydramine hcl], and Spironolactone   Social History:  The patient  reports that she has never smoked. She has never used smokeless tobacco. She reports current alcohol use of about 1.0 standard drink of alcohol per week. She reports that she does not use drugs.   Family History:  The patient's  family history includes COPD in her mother; Diabetes in her father; Heart attack in her father; Hypertension in her brother, father, mother, and sister; Liver cancer in her father; Osteoporosis in her mother; Stroke in her maternal grandmother.   ROS:  Please see the history of present illness.   All other systems are personally reviewed and negative.   Physical Exam:   BP 112/76   Pulse 64   Wt 108.9 kg (240 lb)   LMP 05/06/2013   SpO2 94%   BMI 46.87 kg/m  General:  NAD. No resp difficulty, walked into clinic with RW HEENT: Normal Neck: Supple. Thick neck, Carotids 2+ bilat; no bruits. No lymphadenopathy or thryomegaly appreciated. Cor: PMI nondisplaced. Irregular rate & rhythm. No rubs, gallops or murmurs. Lungs: Clear Abdomen: Obese, soft, nontender, nondistended. No hepatosplenomegaly. No bruits or masses. Good bowel sounds. Extremities: No cyanosis, clubbing, rash, edema Neuro: Alert & oriented x 3, cranial nerves grossly intact. Moves all 4 extremities w/o  difficulty. Affect pleasant.  Recent Labs: 10/04/2021: B Natriuretic Peptide 229.4 12/23/2021: ALT 7; BUN 20; Creatinine, Ser 1.28; Hemoglobin 14.5; Platelets 152.0; Potassium 5.1; Sodium 142 03/16/2022: TSH 2.24  Personally reviewed   Wt Readings from Last 3 Encounters:  04/29/22 108.9 kg (240 lb)  04/25/22 108.4 kg (239 lb)  04/11/22 110.7 kg (244 lb 1.6 oz)    ASSESSMENT AND PLAN: 1. Chronic diastolic CHF:  Associated with severe pulmonary hypertension and prominent RV failure.  NYHA class II symptoms, unchanged.  Echo 1/23 showed EF 60-65%, mild LVH, normal RV, mild RV enlargement, severe biatrial enlargement, mild MR, no significant aortic stenosis. She is not volume overloaded today.   - Continue Lasix 40 qam/20 qpm.  BMET/BNP today.   - Continue diamox 250 mg daily.  - Continue Jardiance 10 mg daily. Had a recent UTI, I asked her to notify clinic if re-curs and will stop SGLT2i 2. Chronic hypercarbic/hypoxic  respiratory failure with OHS/OSA:  Using bipap at night and oxygen during the day with exertion.   - Compliant with Bipap nightly.  - Continue regular exercise and efforts at weight loss. - Continue pulmonary followup.  3. Atrial fibrillation: Chronic, rate controlled  - Continue atenolol 50 mg daily.    - Continue Xarelto for anticoagulation. No bleeding issues. 4. 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  PVR 5.7 WU by [place] in 3/18.  Suspect group 2 (elevated LA pressure) and group 3 (OHS/OSA) PH. However, cannot rule out group 1 component.  She had V/Q scan that was not suggestive of chronic PE and high resolution chest CT that was not suggestive of ILD.  ANA, RF, anti-SCL70 all negative.  She did not have a marked improvement from taking Adcirca => suspect predominantly group 2 and 3 PH.  Will hold off on additional pulmonary vasodilators. Echo 1/23 showed with mildly enlarged RV with normal systolic function, unable to estimate PA systolic pressure.  - Continue Adcirca 40 mg daily.  - Continue BiPAP at night for OSA and oxygen with exertion.  5. HTN: BP controlled generally.  6. Obesity: Insurance would not cover semaglutide. 7. Gait instability: Refer to outpatient PT for balance training.  Followup 6 months with [name].  Signed, Rafael Bihari, FNP  04/29/2022  [facility] [address] [facility] [place] [address] [phone] (office) [phone] (fax)

## 2022-04-29 NOTE — Patient Instructions (Addendum)
Thank you for coming in today  Labs were done today, if any labs are abnormal the clinic will call you No news is good news  EKG was done   You have been referred to home health for PT/OT   Your physician recommends that you schedule a follow-up appointment in:  6 months with [name] call in November 2023 to make appointment for February 2024    Do the following things EVERYDAY: Weigh yourself in the morning before breakfast. Write it down and keep it in a log. Take your medicines as prescribed Eat low salt foods--Limit salt (sodium) to 2000 mg per day.  Stay as active as you can everyday Limit all fluids for the day to less than 2 liters  At the [facility], you and your health needs are our priority. As part of our continuing mission to provide you with exceptional heart care, we have created designated Provider Care Teams. These Care Teams include your primary Cardiologist (physician) and Advanced Practice Providers (APPs- Physician Assistants and Nurse Practitioners) who all work together to provide you with the care you need, when you need it.   You may see any of the following providers on your designated Care Team at your next follow up: Dr Glori Bickers Dr Loralie Champagne Dr. Roxana Hires, NP Lyda Jester, [place] [facility] [place], [place] Forestine Na, NP Audry Riles, PharmD   Please be sure to bring in all your medications bottles to every appointment.   If you have any questions or concerns before your next appointment please send [place] a message through [place] or call our office at [phone].    TO LEAVE A MESSAGE FOR THE NURSE SELECT OPTION 2, PLEASE LEAVE A MESSAGE INCLUDING: YOUR NAME DATE OF BIRTH CALL BACK NUMBER REASON FOR CALL**this is important as we prioritize the call backs  YOU WILL RECEIVE A CALL BACK THE SAME DAY AS LONG AS YOU CALL BEFORE 4:00 PM

## 2022-05-05 ENCOUNTER — Encounter (HOSPITAL_COMMUNITY): Payer: Self-pay | Admitting: *Deleted

## 2022-05-24 ENCOUNTER — Other Ambulatory Visit (HOSPITAL_COMMUNITY): Payer: Self-pay

## 2022-05-24 ENCOUNTER — Other Ambulatory Visit: Payer: Self-pay | Admitting: Internal Medicine

## 2022-05-24 ENCOUNTER — Telehealth (HOSPITAL_COMMUNITY): Payer: Self-pay | Admitting: Pharmacy Technician

## 2022-05-24 DIAGNOSIS — Z1231 Encounter for screening mammogram for malignant neoplasm of breast: Secondary | ICD-10-CM

## 2022-05-24 NOTE — Telephone Encounter (Signed)
Advanced Heart Failure Patient Advocate Encounter  Patient called in, requesting help with Xarelto. Co-pays are unaffordable. Called and spoke with the patient. She is going to work on getting the OOP report to determine J&J assistance eligibility (needs 4% at least spent). She is aware of the [facility] select program that offers patient's who are in the donut hole a discounted co-pay of $85 per month or $240 per 90 days.   Will provide a month of samples while she gets that together. Medication Samples have been provided to the patient.  Drug name: Xarelto       Strength: 2m        Qty: 4 bottles  LOT: [id number] Exp.Date: 11/2023  Dosing instructions: Take 1 tablet daily.  The patient has been instructed regarding the correct time, dose, and frequency of taking this medication, including desired effects and most common side effects.   ENew Bedford4:45 PM 05/24/2022

## 2022-05-31 DIAGNOSIS — H26493 Other secondary cataract, bilateral: Secondary | ICD-10-CM | POA: Diagnosis not present

## 2022-06-03 NOTE — Telephone Encounter (Signed)
Advanced Heart Failure Patient Advocate Encounter  Patient has enough samples to reach the 'end of October.'   She will contact us when supply is low to request additional samples/ update. She has not met the 4% OOP criteria for $0 copay assistance, but is in the donut hole and can register self online with Engineer, maintenance.  Clista Bernhardt, CPhT Rx Patient Advocate Phone: [phone]

## 2022-06-17 ENCOUNTER — Other Ambulatory Visit: Payer: Self-pay | Admitting: Internal Medicine

## 2022-06-17 ENCOUNTER — Ambulatory Visit
Admission: RE | Admit: 2022-06-17 | Discharge: 2022-06-17 | Disposition: A | Discharge status: Home / Self Care | Payer: PPO | Source: Ambulatory Visit | Attending: Internal Medicine | Admitting: Internal Medicine

## 2022-06-17 ENCOUNTER — Other Ambulatory Visit (HOSPITAL_COMMUNITY): Payer: Self-pay | Admitting: Cardiology

## 2022-06-17 DIAGNOSIS — Z1231 Encounter for screening mammogram for malignant neoplasm of breast: Secondary | ICD-10-CM | POA: Diagnosis not present

## 2022-06-19 ENCOUNTER — Other Ambulatory Visit (HOSPITAL_COMMUNITY): Payer: Self-pay | Admitting: Cardiology

## 2022-06-20 NOTE — Telephone Encounter (Signed)
Advanced Heart Failure Patient Advocate Encounter  Patient called, is down to last week of medication. Will check in office on 10/17 to see if additional samples are available. If samples can be provided, this patient will only need assistance with 6 weeks of medication until insurance resets for the new year.  Provided [employer], patient will try to register for this savings program and let us know if there are any issues.  Clista Bernhardt, CPhT Rx Patient Advocate Phone: [phone]

## 2022-06-21 NOTE — Telephone Encounter (Signed)
Advanced Heart Failure Patient Advocate Encounter  Medication Samples have been provided to the patient.   Drug name: Xarelto 20MG Qty: 4 (7 ct) packages LOT: [id number] Exp.: 11/2023 SIG: Take 1 tablet by mouth daily.   The patient has been instructed regarding the correct time, dose, and frequency of taking this medication, including desired effects and most common side effects.   Clista Bernhardt, CPhT Rx Patient Advocate Phone: [phone]

## 2022-06-27 ENCOUNTER — Ambulatory Visit: Payer: PPO | Admitting: Internal Medicine

## 2022-07-05 ENCOUNTER — Ambulatory Visit (INDEPENDENT_AMBULATORY_CARE_PROVIDER_SITE_OTHER): Payer: PPO | Admitting: Internal Medicine

## 2022-07-05 ENCOUNTER — Encounter: Payer: Self-pay | Admitting: Internal Medicine

## 2022-07-05 VITALS — BP 120/70 | HR 54 | Temp 97.6°F | Ht 59.0 in | Wt 240.4 lb

## 2022-07-05 DIAGNOSIS — Z0000 Encounter for general adult medical examination without abnormal findings: Secondary | ICD-10-CM

## 2022-07-05 DIAGNOSIS — Z7901 Long term (current) use of anticoagulants: Secondary | ICD-10-CM | POA: Diagnosis not present

## 2022-07-05 DIAGNOSIS — I700 Atherosclerosis of aorta: Secondary | ICD-10-CM | POA: Diagnosis not present

## 2022-07-05 DIAGNOSIS — I482 Chronic atrial fibrillation, unspecified: Secondary | ICD-10-CM | POA: Diagnosis not present

## 2022-07-05 DIAGNOSIS — E039 Hypothyroidism, unspecified: Secondary | ICD-10-CM | POA: Diagnosis not present

## 2022-07-05 DIAGNOSIS — Z79899 Other long term (current) drug therapy: Secondary | ICD-10-CM | POA: Diagnosis not present

## 2022-07-05 DIAGNOSIS — R7303 Prediabetes: Secondary | ICD-10-CM

## 2022-07-05 DIAGNOSIS — E785 Hyperlipidemia, unspecified: Secondary | ICD-10-CM

## 2022-07-05 LAB — POCT GLYCOSYLATED HEMOGLOBIN (HGB A1C): Hemoglobin A1C: 5.3 % (ref 4.0–5.6)

## 2022-07-05 NOTE — Patient Instructions (Addendum)
Good to see you today . Lab stable  A1c today . Exellent  5.3  Advise  rsv vaccine; probably Benefit more than risk . Continue fall prevention . Healthy weight control  Plan rov in about 6 mos  lab as indicated at that time
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
## 2022-07-08 ENCOUNTER — Other Ambulatory Visit (HOSPITAL_COMMUNITY): Payer: Self-pay | Admitting: Pharmacist

## 2022-07-08 MED ORDER — RIVAROXABAN 20 MG PO TABS: ORAL_TABLET | ORAL | 5 refills | Status: DC

## 2022-07-13 DIAGNOSIS — J9610 Chronic respiratory failure, unspecified whether with hypoxia or hypercapnia: Secondary | ICD-10-CM | POA: Diagnosis not present

## 2022-07-13 DIAGNOSIS — G4733 Obstructive sleep apnea (adult) (pediatric): Secondary | ICD-10-CM | POA: Diagnosis not present

## 2022-07-25 ENCOUNTER — Other Ambulatory Visit (HOSPITAL_COMMUNITY): Payer: Self-pay | Admitting: Cardiology

## 2022-08-02 ENCOUNTER — Telehealth: Payer: Self-pay | Admitting: Pharmacist

## 2022-08-02 ENCOUNTER — Other Ambulatory Visit: Payer: Self-pay | Admitting: Internal Medicine

## 2022-08-02 NOTE — Chronic Care Management (AMB) (Signed)
Chronic Care Management Pharmacy Assistant   Name: [name]  MRN: [id number] DOB: 1951-07-15  08/03/2022 APPOINTMENT REMINDER  Called [name], No answer, left message of appointment on 08/03/2022 at 10:30 via telephone visit with [name], Pharm D. Notified to have all medications, supplements, blood pressure and/or blood sugar logs available during appointment and to return call if need to reschedule.  Care Gaps: AWV - scheduled 05/01/2023 Last BP - 120/70 on 07/05/2022 Last A1C - 5.3 on 07/05/2022 Covid - overdue  Star Rating Drug: Rosuvastatin 4m - last filled [facility]/20/2022 [facility] Jardiance 10 mg - last filled 12/28/2020 [facility] verified with pharm tech   Any gaps in medications fill history? Yes  JSandy RidgePharmacist Assistant [phone]

## 2022-08-03 ENCOUNTER — Ambulatory Visit (INDEPENDENT_AMBULATORY_CARE_PROVIDER_SITE_OTHER): Payer: PPO | Admitting: Pharmacist

## 2022-08-03 DIAGNOSIS — E785 Hyperlipidemia, unspecified: Secondary | ICD-10-CM

## 2022-08-03 DIAGNOSIS — I482 Chronic atrial fibrillation, unspecified: Secondary | ICD-10-CM

## 2022-08-03 NOTE — Progress Notes (Signed)
Chronic Care Management Pharmacy Note  08/04/2022 Name:  [name] MRN:  [id number] DOB:  1951/07/05  Summary: BP is at goal < 140/90 per home readings Pt is getting medications through patient assistance  Recommendations/Changes made from today's visit: -Sent patient PAP for Jardiance -Recommended continued BP monitoring at home  Plan: Follow up on BP in 6 months Follow up in 1 year  Subjective: [name] is an 71 y.o. year old female who is a primary patient of Panosh, Standley Brooking, MD.  The CCM team was consulted for assistance with disease management and care coordination needs.    Engaged with patient by telephone for follow up visit in response to provider referral for pharmacy case management and/or care coordination services.   Consent to Services:  The patient was given information about Chronic Care Management services, agreed to services, and gave verbal consent prior to initiation of services.  Please see initial visit note for detailed documentation.   Patient Care Team: Panosh, Standley Brooking, MD as PCP - General Gwenlyn Found Pearletha Forge, MD (Cardiology) Garvin Fila, MD (Neurology) Newt Minion, MD as Attending Physician (Orthopedic Surgery) Suella Broad, MD as Consulting Physician (Physical Medicine and Rehabilitation) Netta Cedars, MD as Consulting Physician (Orthopedic Surgery) Juanito Doom, MD as Consulting Physician (Pulmonary Disease) Larey Dresser, MD as Consulting Physician (Cardiology) Viona Gilmore, [facility] as Pharmacist (Pharmacist)  Recent office visits: 07/05/22 Stacy Ace, MD: Patient presented for annual exam. Follow up in 6 months and will complete labs at that time.  04/25/22 Glenna Durand, LPN: Patient presented for AWV.  04/11/22 Domingo Mend, MD: Patient presented for UTI. Prescribed Bactrim DS x 7 days.  Recent consult visits: 04/29/22 Allena Katz, NP (cardiology): Patient presented for CHF follow up.  Referred to PT/OT for gait instability. Follow up in 6 months.  03/24/22 Tammy Parrett NP (pulmonary disease) - Patient was seen for obstructive sleep apnea and additional issues. No medication changes. Follow up in 6 months.  12/23/2021 Ellouise Newer PA (GI) - Patient was seen for LLQ pain and additional issues. Started Hydrocortisone 1% topical twice daily. No follow up noted.    Hospital visits: None  Objective:  Lab Results  Component Value Date   CREATININE 1.20 (H) 04/29/2022   BUN 28 (H) 04/29/2022   GFR 41.90 (L) 12/23/2021   GFRNONAA 48 (L) 04/29/2022   GFRAA 47 (L) 09/19/2018   NA 141 04/29/2022   K 4.2 04/29/2022   CALCIUM 9.2 04/29/2022   CO2 23 04/29/2022    Lab Results  Component Value Date/Time   HGBA1C 5.3 07/05/2022 03:31 PM   HGBA1C 5.8 05/19/2021 03:18 PM   HGBA1C 5.6 12/14/2020 10:32 AM   GFR 41.90 (L) 12/23/2021 10:11 AM   GFR 39.83 (L) 05/19/2021 03:18 PM   MICROALBUR 0.2 07/09/2009 08:29 AM    Last diabetic Eye exam: No results found for: "HMDIABEYEEXA"  Last diabetic Foot exam: No results found for: "HMDIABFOOTEX"   Lab Results  Component Value Date   CHOL 150 03/16/2022   HDL 63.60 03/16/2022   LDLCALC 65 03/16/2022   LDLDIRECT 139.4 06/30/2010   TRIG 105.0 03/16/2022   CHOLHDL 2 03/16/2022       Latest Ref Rng & Units 12/23/2021   10:11 AM 05/19/2021    3:18 PM 12/14/2020   10:32 AM  Hepatic Function  Total Protein 6.0 - 8.3 g/dL 7.0  6.2  6.2   Albumin 3.5 - 5.2 g/dL 4.1  4.1  4.1   AST 0 - 37 U/L _0 ALT 0 - 35 U/L _1 Alk Phosphatase 39 - 117 U/L 89  83  105   Total Bilirubin 0.2 - 1.2 mg/dL 0.6  0.7  0.6   Bilirubin, Direct 0.0 - 0.3 mg/dL  0.2  0.1     Lab Results  Component Value Date/Time   TSH 2.24 03/16/2022 09:26 AM   TSH 1.69 12/14/2020 10:32 AM   FREET4 1.26 (H) 11/10/2016 03:04 AM   FREET4 1.12 09/28/2010 09:09 AM       Latest Ref Rng & Units 12/23/2021   10:11 AM 10/04/2021   11:19 AM 05/19/2021     3:18 PM  CBC  WBC 4.0 - 10.5 K/uL 7.4  5.5  7.3   Hemoglobin 12.0 - 15.0 g/dL 14.5  14.8  14.3   Hematocrit 36.0 - 46.0 % 44.7  45.6  44.9   Platelets 150.0 - 400.0 K/uL 152.0  131  133.0     Lab Results  Component Value Date/Time   VD25OH 58.70 04/08/2019 09:50 AM   VD25OH 52.56 07/20/2016 10:21 AM    Clinical ASCVD: No  The 10-year ASCVD risk score (Arnett DK, et al., 2019) is: 12.8%   Values used to calculate the score:     Age: 76 years     Sex: Female     Is Non-Hispanic African American: No     Diabetic: No     Tobacco smoker: No     Systolic Blood Pressure: 042 mmHg     Is BP treated: Yes     HDL Cholesterol: 63.6 mg/dL     Total Cholesterol: 150 mg/dL       07/05/2022    3:06 PM 04/25/2022    9:19 AM 04/11/2022    1:06 PM  Depression screen PHQ 2/9  Decreased Interest 0 0 0  Down, Depressed, Hopeless 0 0 0  PHQ - 2 Score 0 0 0  Altered sleeping 0  0  Tired, decreased energy 0  0  Change in appetite 0  0  Feeling bad or failure about yourself  0  0  Trouble concentrating 0  0  Moving slowly or fidgety/restless 0  0  Suicidal thoughts 0  0  PHQ-9 Score 0  0  Difficult doing work/chores Not difficult at all       CHA2DS2/VAS Stroke Risk Points  Current as of a minute ago     5 >= 2 Points: High Risk  1 - 1.99 Points: Medium Risk  0 Points: Low Risk    Last Change: N/A      Details    This score determines the patient's risk of having a stroke if the  patient has atrial fibrillation.       Points Metrics  1 Has Congestive Heart Failure:  Yes    Current as of a minute ago  1 Has Vascular Disease:  Yes    Current as of a minute ago  1 Has Hypertension:  Yes    Current as of a minute ago  1 Age:  25    Current as of a minute ago  0 Has Diabetes:  No    Current as of a minute ago  0 Had Stroke:  No  Had TIA:  No  Had Thromboembolism:  No    Current as of a minute ago  1 Female:  Yes  Current as of a minute ago         Social History    Tobacco Use  Smoking Status Never  Smokeless Tobacco Never   BP Readings from Last 3 Encounters:  07/05/22 120/70  04/29/22 112/76  04/25/22 110/68   Pulse Readings from Last 3 Encounters:  07/05/22 (!) 54  04/29/22 64  04/25/22 66   Wt Readings from Last 3 Encounters:  07/05/22 240 lb 6.4 oz (109 kg)  04/29/22 240 lb (108.9 kg)  04/25/22 239 lb (108.4 kg)    Assessment/Interventions: Review of patient past medical history, allergies, medications, health status, including review of consultants reports, laboratory and other test data, was performed as part of comprehensive evaluation and provision of chronic care management services.   SDOH:  (Social Determinants of Health) assessments and interventions performed: Yes  SDOH Interventions    Flowsheet Row Chronic Care Management from 08/03/2022 in [place] at [place] from 05/25/2021 in [facility] for Heart, Vascular, & [place] from 04/22/2021 in [place] [facility] [place] [facility] Walk Test from 03/15/2021 in [facility] for Heart, Vascular, & [place] Management from 05/08/2020 in [place] [facility] [place] from 04/21/2020 in [place] [facility] [place] Interventions -- -- Intervention Not Indicated -- -- Intervention Not Indicated  Housing Interventions -- -- Intervention Not Indicated -- -- Intervention Not Indicated  Transportation Interventions -- -- Intervention Not Indicated -- Intervention Not Indicated Intervention Not Indicated  Depression Interventions/Treatment  -- PHQ2-9 Score <4 Follow-up Not Indicated -- PHQ2-9 Score <4 Follow-up Not Indicated -- PHQ2-9 Score <4 Follow-up Not Indicated  Financial Strain Interventions Other (Comment)  [sent patient Jardiance pap application] -- Intervention Not Indicated --  Intervention Not Indicated Intervention Not Indicated  Physical Activity Interventions -- -- Intervention Not Indicated -- -- Intervention Not Indicated  [Patient is awaiting shoulder surgery so that she may get back to exercising]  Stress Interventions -- -- Intervention Not Indicated -- -- Intervention Not Indicated  Social Connections Interventions -- -- Intervention Not Indicated -- -- Intervention Not Indicated       CCM Care Plan  Allergies  Allergen Reactions   Ambien [Zolpidem Tartrate] Other (See Comments)    Amnesia and fall    Losartan Other (See Comments)    Elevated creatinine and swelling   Prevacid [Lansoprazole] Swelling   Adhesive [Tape] Other (See Comments)    Adhesive tape and band aids " irritate, " causes blisters and pulls skin when removing. Okay to use paper tape   Keflex [Cephalexin] Swelling    Swelling in ankles, feet   Motrin [Ibuprofen] Other (See Comments)    ANKLE EDEMA    Prilosec [Omeprazole] Swelling    Swelling in ankles.   [name] Inhibitors Cough   Benadryl [Diphenhydramine Hcl] Other (See Comments)    topical   Spironolactone Rash    Medications Reviewed Today     Reviewed by Viona Gilmore, [facility] (Pharmacist) on 08/03/22 at 1047  Med List Status: <None>   Medication Order Taking? Sig Documenting Provider Last Dose Status Informant  acetaZOLAMIDE (DIAMOX) 250 MG tablet [id number]  TAKE 1 TABLET BY MOUTH EVERY DAY Larey Dresser, MD  Active   ADCIRCA 20 MG tablet [id number] Yes TAKE 2 TABLETS BY MOUTH 1 TIME A DAY. Larey Dresser, MD Taking Active   allopurinol [facility])  100 MG tablet [id number]  TAKE 1 TABLET BY MOUTH TWICE A DAY Panosh, Standley Brooking, MD  Active   atenolol (TENORMIN) 50 MG tablet [id number]  TAKE 1 TABLET BY MOUTH EVERY DAY Larey Dresser, MD  Active   Cholecalciferol (VITAMIN D) 125 MCG (5000 UT) CAPS [id number]  Take 5,000 Units by mouth 3 (three) times a week.  [provider]  Active Self  diclofenac sodium  (VOLTAREN) 1 % GEL [id number] Yes Apply 2 g topically 2 (two) times daily as needed (for arthritis). [provider] Taking Active Self  empagliflozin (JARDIANCE) 10 MG TABS tablet [id number] Yes Take 1 tablet (10 mg total) by mouth daily. Larey Dresser, MD Taking Active   Ferrous Sulfate (SLOW FE PO) [id number]  Take 1 tablet by mouth 3 (three) times a week. [provider]  Active   furosemide (LASIX) 20 MG tablet [id number]  Take 40 mg by mouth daily. 20 mg in the afternoon [provider]  Active   levothyroxine (SYNTHROID) 75 MCG tablet [id number]  TAKE 1 TABLET BY MOUTH EVERY DAY Panosh, Standley Brooking, MD  Active   Methylcobalamin (B12-ACTIVE PO) [id number]  Take 1 tablet by mouth 2 (two) times a week. [provider]  Active Self  potassium chloride (KLOR-CON) 10 MEQ tablet [id number]  TAKE 2 TABLETS BY MOUTH 2 TIMES DAILY. Larey Dresser, MD  Active   rivaroxaban (XARELTO) 20 MG TABS tablet [id number]  TAKE 1 TABLET (20 MG TOTAL) BY MOUTH DAILY WITH SUPPER. Larey Dresser, MD  Active   rosuvastatin (CRESTOR) 5 MG tablet [id number]  TAKE 1 TABLET (5 MG TOTAL) BY MOUTH DAILY. Larey Dresser, MD  Active     Discontinued 11/18/11 1032   traMADol (ULTRAM) 50 MG tablet [id number]  Take 1 tablet (50 mg total) by mouth 3 (three) times daily as needed. Panosh, Standley Brooking, MD  Active             Patient Active Problem List   Diagnosis Date Noted   Balance problem 04/29/2022   S/P shoulder replacement, right 07/13/2020   Preoperative clearance 04/08/2020   Rib pain on right side 08/15/2018   Degeneration of lumbar intervertebral disc 05/03/2018   Essential hypertension 01/10/2018   Pulmonary hypertension, primary ([place])    Aortic atherosclerosis ([place]) 11/06/2016   Obesity hypoventilation syndrome ([place]) 11/05/2016   Coronary artery calcification seen on CAT scan 11/05/2016   Chronic respiratory failure with hypoxia ([place]) 11/04/2016   Pleural effusion, right  11/04/2016   Chronic diastolic (congestive) heart failure ([place]) 11/04/2016   Pulmonary hypertension ([place]) 09/27/2016   Hypoxemia 09/27/2016   Gout 01/20/2014   Abnormal LFTs 05/16/2012   Vitamin B12 deficiency 05/16/2012   Hypertensive heart disease    B12 deficiency anemia 03/26/2012   Shortness of breath 02/17/2012   Prediabetes 10/02/2011   Neuropathy, 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   Chronic anticoagulation 06/28/2011   Chronic atrial fibrillation ([place]) 01/21/2011   OSA (obstructive sleep apnea) 01/21/2011   History of stroke 11/19/2010   DJD (degenerative joint disease) 05/17/2010   Hyperlipidemia 04/09/2007   Hypothyroidism 02/28/2007   Morbid obesity ([place]) 02/28/2007    Immunization History  Administered Date(s) Administered   Fluad Quad(high Dose 65+) 05/19/2021, 05/30/2022   Influenza Split 06/28/2011, 05/16/2012, 05/25/2020   Influenza Whole 06/02/2008, 06/24/2009, 05/17/2010   Influenza, High Dose Seasonal PF 05/25/2015, 06/10/2016, 04/27/2017, 06/01/2018, 05/02/2019, 06/06/2019   Influenza,inj,Quad PF,6+ Mos 05/21/2013, 06/10/2014   Influenza-Unspecified 04/27/2017   PFIZER(Purple Top)SARS-COV-2 Vaccination 09/26/2019,  10/14/2019, 05/25/2020, 12/12/2020   PPD Test 05/02/2016   Pfizer Covid-19 Vaccine Bivalent Booster 53yr & up 05/26/2021, 05/30/2022   Pneumococcal Conjugate-13 07/16/2013   Pneumococcal Polysaccharide-23 04/22/2015   Respiratory Syncytial Virus Vaccine,Recomb Aduvanted(Arexvy) 08/02/2022   Td 09/05/2001   Tdap 07/16/2013   Zoster Recombinat (Shingrix) 12/11/2017, 06/01/2018   Zoster, Live 09/27/2011   Patient has Afib and sees a lot of variation with her HR and as low as 46 for resting and then jumps back up to 55. She reports it doesn't stop. HR jumps from 46-65 - up to 85. She cannot tell when she is having the lower HR. This morning when she was in the 50s she was relaxing.  She doesn't use the Voltaren gel often or the tramadol. She still has 4-5  tablets left and only takes it when she is really feeling pain and at night. She uses Voltaren gel at night and mostly on her knee. She has injections before and will think about going to do injections with [facility]. Sometimes the weather makes it worse and the bones make it real cold.   Patient did not get her Jardiance PAP re-enrollment form. Will mail this to her.  Conditions to be addressed/monitored:  Hypertension, Hyperlipidemia, Atrial Fibrillation, Heart Failure, Hypothyroidism, Osteoarthritis, Gout and history of stroke and pre-diabetes  Conditions addressed this visit: Hypertension, Atrial fibrillation, heart failure  Care Plan : [place] Updates made by PViona Gilmore RArlingtonsince 08/04/2022 12:00 AM     Problem: Problem: Hypertension, Hyperlipidemia, Atrial Fibrillation, Heart Failure, Hypothyroidism, Osteoarthritis, Gout and history of stroke and pre-diabetes      Long-Range Goal: Patient-Specific Goal   Start Date: 11/03/2020  Expected End Date: 11/03/2021  Recent Progress: On track  Priority: High  Note:   Current Barriers:  Unable to independently monitor therapeutic efficacy Cost of brand name medication is high  Pharmacist Clinical Goal(s):  Patient will verbalize ability to afford treatment regimen achieve adherence to monitoring guidelines and medication adherence to achieve therapeutic efficacy through collaboration with PharmD and provider.   Interventions: 1:1 collaboration with Panosh, WStandley Brooking MD regarding development and update of comprehensive plan of care as evidenced by provider attestation and co-signature Inter-disciplinary care team collaboration (see longitudinal plan of care) Comprehensive medication review performed; medication list updated in electronic medical record  Hypertension (BP goal <130/80) -Controlled  -Current treatment: Atenolol 574m 1 tablet daily - Appropriate, Effective, Safe, Accessible -Medications previously  tried: lisinopril (dropping BP too low) -Current home readings: 117/68; typically 100-110/65 (arm cuff) -Current dietary habits: patient notes diet consists of lots of fruits and vegetables during the summer especially -Current exercise habits: patient doing chair exercises - 2-3 times a week, going 2-3 to the fitness center -Denies hypotensive/hypertensive symptoms -Educated on Importance of home blood pressure monitoring; -Counseled to monitor BP at home weekly, document, and provide log at future appointments -Counseled on diet and exercise extensively Recommended to continue current medication  Hyperlipidemia/history of stroke: (LDL goal < 70) -Controlled -Current treatment: Rosuvastatin 5 mg, 1 tablet daily - Appropriate, Effective, Safe, Accessible -Medications previously tried: simvastatin  -Current dietary patterns: did not discuss -Current exercise habits: patient doing chair exercises - 2-3 times a week, plans to go back to the gym with new step (like an elyptical) -Educated on Cholesterol goals;  Exercise goal of 150 minutes per week; -Recommended to continue current medication  Pre-diabetes (A1c goal <6.5%) -Controlled -Current medications: No medications -Medications previously tried: none  -Current home  glucose readings: does not monitor at home -Denies hypoglycemic/hyperglycemic symptoms -Current meal patterns: did not discuss -Current exercise: patient doing chair exercises - 2-3 times a week, plans to go back to the gym with new step (like an elyptical) -Educated on Exercise goal of 150 minutes per week; Carbohydrate counting and/or plate method -Counseled to check feet daily and get yearly eye exams -Counseled on diet and exercise extensively  Heart Failure (Goal: manage symptoms and prevent exacerbations) -Controlled -Last ejection fraction: 60-65% (Date: 04/2020) -HF type: Diastolic -NYHA Class: I (no actitivty limitation) -Current treatment: acetazolamide  266m, 1 tablet daily  - Appropriate, Effective, Safe, Accessible Jardiance 10 mg 1 tablet daily - Appropriate, Effective, Safe, Accessible torsemide 233m 2 tablets in the AM, 1 in the afternoon as needed - Appropriate, Effective, Safe, Accessible potassium chloride 1030m 2 tablets twice daily - Appropriate, Effective, Safe, Accessible -Medications previously tried: furosemide -Current dietary habits: patient is limiting salt intake; she cooks with it sometimes but doesn't buy processed foods and will check labels -Current exercise habits: going 2-3 times a week fitness center -Educated on Importance of weighing daily; if you gain more than 3 pounds in one day or 5 pounds in one week, call cardiologist -Counseled on diet and exercise extensively Recommended to continue current medication  Atrial Fibrillation (Goal: prevent stroke and major bleeding) -Controlled -CHADSVASC: 5 -Current treatment: Rate control: Atenolol 68m33m tablet daily - Appropriate, Effective, Safe, Accessible Anticoagulation: Xarelto 20mg109mtablet every day with supper - Appropriate, Effective, Safe, Accessible -Medications previously tried: none -Home BP and HR readings: 55-65 BPM -Counseled on bleeding risk associated with Xarelto and importance of self-monitoring for signs/symptoms of bleeding; -Recommended to continue current medication  Hypothyroidism (Goal: TSH 0.35-4.5) -Controlled -Current treatment  Levothyroxine 75mcg39mtablet daily - Appropriate, Effective, Safe, Accessible -Medications previously tried: none  -Recommended to continue current medication  Gout (Goal: uric acid < 6 and prevent flare ups) -Controlled -Current treatment  allopurinol 100mg, 34mblet twice daily - Appropriate, Effective, Safe, Accessible colchicine 0.6mg, 1 43mlet as needed for flare ups - Appropriate, Effective, Safe, Accessible -Medications previously tried: none  -Recommended to continue current  medication  Pulmonary hypertension (Goal: minimize symptoms) -Controlled -Current treatment  tadalafil (Adcirca) 20mg, 2 56mets once daily - Appropriate, Effective, Safe, Accessible -Medications previously tried: none  -Recommended to continue current medication  Osteoarthritis/muscle spasms (Goal: minimize pain) -Controlled -Current treatment  diclofenac 1% gel, 2 g two times daily as needed for arthritis (patient notes using very often) - Appropriate, Effective, Safe, Accessible tramadol 68mg, 1 t8mt three times daily as needed (patient notes using very seldom) - Appropriate, Effective, Safe, Accessible -Medications previously tried: methocarbamol (no longer needed)  -Recommended to continue current medication  Health Maintenance -Vaccine gaps: none -Current therapy:  Betamethasone cream 0.05% apply twice daily as needed Amoxicillin 500 mg capsules PRN for dental procedures Vitamin B12 5000 units three times weekly -Educated on Cost vs benefit of each product must be carefully weighed by individual consumer -Patient is satisfied with current therapy and denies issues -Recommended to continue current medication  Patient Goals/Self-Care Activities Patient will:  - take medications as prescribed check blood pressure weekly, document, and provide at future appointments  Follow Up Plan: Telephone follow up appointment with care management team member scheduled for: 1 year       Medication Assistance:  Tadalafil obtained through [place] program.  Enrollment ends until grant runs out. Jardiance obtained through [facility] approved until  09/04/22.  Compliance/Adherence/Medication fill history: Care Gaps: Last BP - 120/70 on 07/05/2022 Last A1C - 5.3 on 07/05/2022  Star-Rating Drugs: Rosuvastatin 64m - last [facility] Jardiance 10 mg - getting through PAP  Patient's preferred pharmacy is:  [id number]  [place], [address]: [phone]: [address] [place]: [phone]: [phone] - [address] [id number]: [phone]: [phone] Uses pill box? Yes - pill box for 2 weeks Pt endorses 100% compliance  We discussed: Current pharmacy is preferred with insurance plan and patient is satisfied with pharmacy services Patient decided to: Continue current medication management strategy  Care Plan and Follow Up Patient Decision:  Patient agrees to Care Plan and Follow-up.  Plan: Telephone follow up appointment with care management team member scheduled for:  1 year  MaJeni SallesPharmD BCHernandoharmacist LeOccidental Petroleumt [phone]

## 2022-08-03 NOTE — Telephone Encounter (Signed)
Last OV CPE-07/05/22 Last refill-12/28/21--60 tabs, 1 refill  No future OV scheduled.

## 2022-08-04 DIAGNOSIS — E785 Hyperlipidemia, unspecified: Secondary | ICD-10-CM

## 2022-08-04 DIAGNOSIS — I482 Chronic atrial fibrillation, unspecified: Secondary | ICD-10-CM

## 2022-08-04 NOTE — Patient Instructions (Signed)
Hi [name],  It was great speaking with you again! Let me know if you don't get the email with the Jardiance application.  Please reach out to me if you have any questions or need anything before our follow up!  Best, Maddie  [name], PharmD, [place] Pharmacist [place] [facility]

## 2022-08-18 ENCOUNTER — Telehealth (HOSPITAL_COMMUNITY): Payer: Self-pay | Admitting: Pharmacy Technician

## 2022-08-18 NOTE — Telephone Encounter (Signed)
Advanced Heart Failure Patient Advocate Encounter  Sent in [place] application via fax.  Will follow up.

## 2022-08-18 NOTE — Telephone Encounter (Signed)
Advanced Heart Failure Patient Advocate Encounter  Sent in [place] application via fax.  Will follow up.

## 2022-08-30 NOTE — Telephone Encounter (Signed)
Advanced Heart Failure Patient Advocate Encounter  Patient was approved to receive Jardiance from [place]. Effective 09/06/22 to 09/05/23 Determination letter has been added to patient chart.  Clista Bernhardt, CPhT Rx Patient Advocate Phone: [phone]

## 2022-09-05 ENCOUNTER — Other Ambulatory Visit: Payer: Self-pay | Admitting: Internal Medicine

## 2022-09-08 NOTE — Telephone Encounter (Signed)
Advanced Heart Failure Patient Advocate Encounter   Patient was approved to receive Adcirca from [place]. The [facility] [phone]).  Effective dates: 09/07/22 through 09/05/23  Document scanned to chart.   Charlann Boxer, CPhT

## 2022-09-12 ENCOUNTER — Other Ambulatory Visit: Payer: Self-pay | Admitting: Internal Medicine

## 2022-09-16 ENCOUNTER — Other Ambulatory Visit: Payer: Self-pay | Admitting: Internal Medicine

## 2022-11-02 ENCOUNTER — Ambulatory Visit (INDEPENDENT_AMBULATORY_CARE_PROVIDER_SITE_OTHER): Payer: PPO | Admitting: Pulmonary Disease

## 2022-11-02 ENCOUNTER — Encounter: Payer: Self-pay | Admitting: Pulmonary Disease

## 2022-11-02 VITALS — BP 130/82 | HR 71 | Ht 60.0 in | Wt 240.6 lb

## 2022-11-02 DIAGNOSIS — J9611 Chronic respiratory failure with hypoxia: Secondary | ICD-10-CM

## 2022-11-02 DIAGNOSIS — I272 Pulmonary hypertension, unspecified: Secondary | ICD-10-CM | POA: Diagnosis not present

## 2022-11-02 DIAGNOSIS — E662 Morbid (severe) obesity with alveolar hypoventilation: Secondary | ICD-10-CM | POA: Diagnosis not present

## 2022-11-02 DIAGNOSIS — I5032 Chronic diastolic (congestive) heart failure: Secondary | ICD-10-CM

## 2022-11-02 NOTE — Patient Instructions (Addendum)
   I will see you back in about 6 months  Continue using your CPAP on a nightly basis  Call us with any significant concerns  Graded activities as tolerated
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
## 2022-11-04 ENCOUNTER — Encounter: Payer: Self-pay | Admitting: Internal Medicine

## 2022-11-04 DIAGNOSIS — R269 Unspecified abnormalities of gait and mobility: Secondary | ICD-10-CM

## 2022-11-08 ENCOUNTER — Other Ambulatory Visit: Payer: Self-pay

## 2022-11-08 DIAGNOSIS — R269 Unspecified abnormalities of gait and mobility: Secondary | ICD-10-CM

## 2022-11-08 DIAGNOSIS — R2689 Other abnormalities of gait and mobility: Secondary | ICD-10-CM

## 2022-11-17 ENCOUNTER — Other Ambulatory Visit (HOSPITAL_COMMUNITY): Payer: Self-pay | Admitting: Cardiology

## 2022-11-18 DIAGNOSIS — G4733 Obstructive sleep apnea (adult) (pediatric): Secondary | ICD-10-CM | POA: Diagnosis not present

## 2022-11-18 DIAGNOSIS — J9610 Chronic respiratory failure, unspecified whether with hypoxia or hypercapnia: Secondary | ICD-10-CM | POA: Diagnosis not present

## 2022-12-01 ENCOUNTER — Other Ambulatory Visit: Payer: Self-pay | Admitting: Internal Medicine

## 2022-12-13 ENCOUNTER — Telehealth (INDEPENDENT_AMBULATORY_CARE_PROVIDER_SITE_OTHER): Payer: PPO | Admitting: Internal Medicine

## 2022-12-13 ENCOUNTER — Encounter: Payer: Self-pay | Admitting: Internal Medicine

## 2022-12-13 VITALS — BP 121/67 | HR 54 | Ht 60.0 in | Wt 239.6 lb

## 2022-12-13 DIAGNOSIS — R1033 Periumbilical pain: Secondary | ICD-10-CM

## 2022-12-13 DIAGNOSIS — K429 Umbilical hernia without obstruction or gangrene: Secondary | ICD-10-CM | POA: Diagnosis not present
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
## 2022-12-14 ENCOUNTER — Other Ambulatory Visit (HOSPITAL_COMMUNITY): Payer: Self-pay | Admitting: Cardiology

## 2022-12-17 ENCOUNTER — Other Ambulatory Visit (HOSPITAL_COMMUNITY): Payer: Self-pay | Admitting: Cardiology

## 2022-12-21 ENCOUNTER — Other Ambulatory Visit: Payer: Self-pay

## 2022-12-21 DIAGNOSIS — K429 Umbilical hernia without obstruction or gangrene: Secondary | ICD-10-CM

## 2022-12-21 DIAGNOSIS — R1033 Periumbilical pain: Secondary | ICD-10-CM

## 2022-12-22 ENCOUNTER — Other Ambulatory Visit (HOSPITAL_COMMUNITY): Payer: Self-pay | Admitting: Cardiology

## 2022-12-27 ENCOUNTER — Ambulatory Visit (HOSPITAL_BASED_OUTPATIENT_CLINIC_OR_DEPARTMENT_OTHER)
Admission: RE | Admit: 2022-12-27 | Discharge: 2022-12-27 | Disposition: A | Discharge status: Home / Self Care | Payer: PPO | Source: Ambulatory Visit | Attending: Internal Medicine | Admitting: Internal Medicine

## 2022-12-27 DIAGNOSIS — K8020 Calculus of gallbladder without cholecystitis without obstruction: Secondary | ICD-10-CM | POA: Diagnosis not present

## 2022-12-27 DIAGNOSIS — R1033 Periumbilical pain: Secondary | ICD-10-CM | POA: Diagnosis not present

## 2022-12-27 DIAGNOSIS — R109 Unspecified abdominal pain: Secondary | ICD-10-CM | POA: Diagnosis not present

## 2022-12-27 DIAGNOSIS — K429 Umbilical hernia without obstruction or gangrene: Secondary | ICD-10-CM | POA: Diagnosis not present

## 2022-12-28 NOTE — Progress Notes (Signed)
US shows multiple gall stones suspect    gall bladder dysfunction is cause of recurrent abd pain .  May we do a surgery consult referral to consider  removal  if  having recurrent tabd pain problems. ? If agree  please do surgery consult .

## 2023-01-03 ENCOUNTER — Telehealth (INDEPENDENT_AMBULATORY_CARE_PROVIDER_SITE_OTHER): Payer: PPO | Admitting: Internal Medicine

## 2023-01-03 ENCOUNTER — Telehealth: Payer: Self-pay

## 2023-01-03 ENCOUNTER — Encounter: Payer: Self-pay | Admitting: Internal Medicine

## 2023-01-03 VITALS — HR 62 | Wt 238.0 lb

## 2023-01-03 DIAGNOSIS — R1033 Periumbilical pain: Secondary | ICD-10-CM | POA: Diagnosis not present

## 2023-01-03 DIAGNOSIS — K429 Umbilical hernia without obstruction or gangrene: Secondary | ICD-10-CM | POA: Diagnosis not present

## 2023-01-03 DIAGNOSIS — K760 Fatty (change of) liver, not elsewhere classified: Secondary | ICD-10-CM

## 2023-01-03 DIAGNOSIS — K8020 Calculus of gallbladder without cholecystitis without obstruction: Secondary | ICD-10-CM

## 2023-01-03 NOTE — Progress Notes (Signed)
Care Management & Coordination Services Pharmacy Team  Reason for Encounter: Hypertension  Contacted patient to discuss hypertension disease state. Spoke with patient on 01/03/2023     Current antihypertensive regimen:  Atenolol 50 mg daily Patient verbally confirms she is taking the above medications as directed. Yes  How often are you checking your Blood Pressure? 1-2 times per month she checks her blood pressure various times  Current home BP readings: Patient states her readings are always between 110/65 - 120/70  Wrist or arm cuff: Arm cuff  Any readings above 180/100? Patient denies  What recent interventions/DTPs have been made by any provider to improve Blood Pressure control since last CPP Visit: No recent interventions  Any recent hospitalizations or ED visits since last visit with CPP? No recent hospital visits.   What diet changes have been made to improve Blood Pressure Control?  Patient follows a low sodium diet Breakfast - she eats breakfast late and have toast or cottage cheese with fruit Lunch - has lunch late and eat a lean cuisine or a sandwich Dinner - a snack of some sort, she likes the packaged snacks that have meat cheese and nuts in them. Caffeine intake - none Salt intake - doesn't add salt  What exercise is being done to improve your Blood Pressure Control?  Patient will will do sitting exercises and does her own housework, she states she is up and moving throughout the day.  Adherence Review: Is the patient currently on ACE/ARB medication? No Does the patient have >5 day gap between last estimated fill dates? No  Care Gaps: AWV - completed 04/25/2022, scheduled 05/01/2023 Last BP - 130/82 on 11/02/2022 Last A1C - 5.3 on 07/05/2022 Covid - overdue   Star Rating Drug: Rosuvastatin 5mg  - last filled 12/15/2022 90 DS at [facility] Jardiance 10 mg - filled through patient assistance   Chart Updates: Recent office visits:  12/13/2022 Berniece Andreas MD -  Patient was seen for Recurrent periumbilical abdominal pain and an additional concern. No medication changes.   Recent consult visits:  11/02/2022 Virl Diamond MD (pulmonary) - Patient was seen for pulmonary hypertension and additional concerns. No medication changes.   Hospital visits:  None  Medications: Outpatient Encounter Medications as of 01/03/2023  Medication Sig   acetaZOLAMIDE (DIAMOX) 250 MG tablet Take 1 tablet (250 mg total) by mouth daily. NEEDS FOLLOW UP APPOINTMENT FOR MORE REFILLS   ADCIRCA 20 MG tablet TAKE 2 TABLETS BY MOUTH 1 TIME A DAY.   allopurinol (ZYLOPRIM) 100 MG tablet TAKE 1 TABLET BY MOUTH TWICE A DAY   atenolol (TENORMIN) 50 MG tablet Take 1 tablet (50 mg total) by mouth daily. NEEDS FOLLOW UP APPOINTMENT FOR MORE REFILLS   Cholecalciferol (VITAMIN D) 125 MCG (5000 UT) CAPS Take 5,000 Units by mouth 3 (three) times a week.    diclofenac sodium (VOLTAREN) 1 % GEL Apply 2 g topically 2 (two) times daily as needed (for arthritis).   empagliflozin (JARDIANCE) 10 MG TABS tablet Take 1 tablet (10 mg total) by mouth daily.   Ferrous Sulfate (SLOW FE PO) Take 1 tablet by mouth 3 (three) times a week.   furosemide (LASIX) 20 MG tablet TAKE 2 TABLETS BY MOUTH TWICE A DAY   levothyroxine (SYNTHROID) 75 MCG tablet TAKE 1 TABLET BY MOUTH EVERY DAY   Methylcobalamin (B12-ACTIVE PO) Take 1 tablet by mouth 2 (two) times a week.   potassium chloride (KLOR-CON) 10 MEQ tablet TAKE 2 TABLETS BY MOUTH 2 TIMES DAILY.  rivaroxaban (XARELTO) 20 MG TABS tablet Take 1 tablet (20 mg total) by mouth daily with supper. NEEDS FOLLOW UP APPOINTMENT FOR MORE REFILLS   rosuvastatin (CRESTOR) 5 MG tablet TAKE 1 TABLET (5 MG TOTAL) BY MOUTH DAILY.   traMADol (ULTRAM) 50 MG tablet TAKE 1 TABLET BY MOUTH 3 TIMES DAILY AS NEEDED.   [DISCONTINUED] TOPAMAX 50 MG tablet Take 50 mg by mouth.   No facility-administered encounter medications on file as of 01/03/2023.  Fill History:  Dispensed Days  Supply Quantity Provider Pharmacy  ACETAZOLAMIDE 250 MG TABLET 12/14/2022 90 90 each      Dispensed Days Supply Quantity Provider Pharmacy  ALLOPURINOL 100 MG TABLET 12/01/2022 90 180 each      Dispensed Days Supply Quantity Provider Pharmacy  ATENOLOL 50 MG TABLET 12/08/2022 90 90 each      Dispensed Days Supply Quantity Provider Pharmacy  JARDIANCE 10 MG TABLET 12/28/2020 14 14 each      Dispensed Days Supply Quantity Provider Pharmacy  FUROSEMIDE 20 MG TABLET 11/17/2022 90 360 each      Dispensed Days Supply Quantity Provider Pharmacy  LEVOTHYROXINE 75 MCG TABLET 12/15/2022 90 90 each      Dispensed Days Supply Quantity Provider Pharmacy  POTASSIUM CL ER 10 MEQ TABLET 12/15/2022 90 360 each      Dispensed Days Supply Quantity Provider Pharmacy  XARELTO 20 MG TABLET 12/22/2022 100 100 each      Dispensed Days Supply Quantity Provider Pharmacy  ROSUVASTATIN CALCIUM 5 MG TAB 12/15/2022 90 90 each      Dispensed Days Supply Quantity Provider Pharmacy  TRAMADOL HCL 50 MG TABLET 08/03/2022 5 15 each     Recent Office Vitals: BP Readings from Last 3 Encounters:  12/13/22 121/67  11/02/22 130/82  07/05/22 120/70   Pulse Readings from Last 3 Encounters:  01/03/23 62  12/13/22 (!) 54  11/02/22 71    Wt Readings from Last 3 Encounters:  01/03/23 238 lb (108 kg)  12/13/22 239 lb 9.6 oz (108.7 kg)  11/02/22 240 lb 9.6 oz (109.1 kg)     Kidney Function Lab Results  Component Value Date/Time   CREATININE 1.20 (H) 04/29/2022 03:16 PM   CREATININE 1.28 (H) 12/23/2021 10:11 AM   CREATININE 1.01 (H) 07/05/2016 10:13 AM   CREATININE 0.90 09/29/2015 02:37 PM   GFR 41.90 (L) 12/23/2021 10:11 AM   GFRNONAA 48 (L) 04/29/2022 03:16 PM   GFRNONAA 58 (L) 07/05/2016 10:13 AM   GFRAA 47 (L) 09/19/2018 09:30 AM   GFRAA 67 07/05/2016 10:13 AM       Latest Ref Rng & Units 04/29/2022    3:16 PM 12/23/2021   10:11 AM 10/04/2021   11:19 AM  BMP  Glucose 70 - 99 mg/dL 95  96  161    BUN 8 - 23 mg/dL 28  20  21    Creatinine 0.44 - 1.00 mg/dL 0.96  0.45  4.09   Sodium 135 - 145 mmol/L 141  142  143   Potassium 3.5 - 5.1 mmol/L 4.2  5.1  4.1   Chloride 98 - 111 mmol/L 111  110  111   CO2 22 - 32 mmol/L 23  24  22    Calcium 8.9 - 10.3 mg/dL 9.2  9.6  9.3    Inetta Fermo [facility]  Clinical Pharmacist Assistant [phone]
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
## 2023-02-12 ENCOUNTER — Other Ambulatory Visit (HOSPITAL_COMMUNITY): Payer: Self-pay | Admitting: Family Medicine

## 2023-02-14 ENCOUNTER — Telehealth: Payer: Self-pay | Admitting: *Deleted

## 2023-02-14 ENCOUNTER — Other Ambulatory Visit: Payer: Self-pay | Admitting: General Surgery

## 2023-02-14 DIAGNOSIS — G4733 Obstructive sleep apnea (adult) (pediatric): Secondary | ICD-10-CM | POA: Diagnosis not present

## 2023-02-14 DIAGNOSIS — I482 Chronic atrial fibrillation, unspecified: Secondary | ICD-10-CM | POA: Diagnosis not present

## 2023-02-14 DIAGNOSIS — K8020 Calculus of gallbladder without cholecystitis without obstruction: Secondary | ICD-10-CM | POA: Diagnosis not present

## 2023-02-14 DIAGNOSIS — Z7901 Long term (current) use of anticoagulants: Secondary | ICD-10-CM | POA: Diagnosis not present

## 2023-02-14 DIAGNOSIS — Z8673 Personal history of transient ischemic attack (TIA), and cerebral infarction without residual deficits: Secondary | ICD-10-CM | POA: Diagnosis not present

## 2023-02-14 DIAGNOSIS — I272 Pulmonary hypertension, unspecified: Secondary | ICD-10-CM | POA: Diagnosis not present

## 2023-02-14 NOTE — Telephone Encounter (Signed)
   Name: Keegan Bensch  DOB: 01/02/1951  MRN: [id number]  Primary Cardiologist: None  Chart reviewed as part of pre-operative protocol coverage. Because of [name]'s past medical history and time since last visit, she will require a follow-up in-office visit in order to better assess preoperative cardiovascular risk.  Pre-op covering staff: - Please schedule appointment and call patient to inform them. If patient already had an upcoming appointment within acceptable timeframe, please add "pre-op clearance" to the appointment notes so provider is aware. - Please contact requesting surgeon's office via preferred method (i.e, phone, fax) to inform them of need for appointment prior to surgery.  Pt is at elevated risk off of anticoag given elevated CHADS2VASc score and hx of prior strokes. She'll need to hold her Xarelto for at least 2 days prior to surgery. Will forward to MD to confirm this is acceptable given her elevated CV risk.    Sharlene Dory, PA-C  02/14/2023, 4:50 PM

## 2023-02-14 NOTE — Telephone Encounter (Signed)
Will fax notes to our [facility] clinic that the pt is going to need an in office appt for pre op clearance. I will update all parties involved.

## 2023-02-14 NOTE — Telephone Encounter (Signed)
Patient with diagnosis of afib on Xarelto for anticoagulation.    Procedure: gallbladder surgery  Date of procedure: TBD  CHA2DS2-VASc Score = 7  This indicates a 11.2% annual risk of stroke. The patient's score is based upon: CHF History: 1 HTN History: 1 Diabetes History: 0 Stroke History: 2 Vascular Disease History: 1 Age Score: 1 Gender Score: 1   Stroke in 2010 and 2012.  CrCl 6mL/min using adjusted body weight due to obesity Platelet count 152K  Pt is at elevated risk off of anticoag given elevated CHADS2VASc score and hx of prior strokes. She'll need to hold her Xarelto for at least 2 days prior to surgery. Will forward to MD to confirm this is acceptable given her elevated CV risk.  **This guidance is not considered finalized until pre-operative APP has relayed final recommendations.**

## 2023-02-14 NOTE — Telephone Encounter (Signed)
   Pre-operative Risk Assessment    Patient Name: [name]  DOB: 07-21-1951 MRN: [id number]      Request for Surgical Clearance    Procedure:   GALLBLADDER SURGERY  Date of Surgery:  Clearance TBD                                 Surgeon:  DR. Alroy Dust Surgeon's Group or Practice Name:  [facility] Phone number:  [phone] Fax number:  [phone]   Type of Clearance Requested:   - Medical  - Pharmacy:  Hold Rivaroxaban (Xarelto)     Type of Anesthesia:  General    Additional requests/questions:    [name]   02/14/2023, 3:07 PM

## 2023-02-16 NOTE — Telephone Encounter (Signed)
Appointment can be with APP for preop please.

## 2023-02-17 ENCOUNTER — Telehealth: Payer: Self-pay | Admitting: Pulmonary Disease

## 2023-02-17 NOTE — Telephone Encounter (Signed)
PT is having an upcoming surgery. SX Clearance has been sent. Wonders if she needs to make an appt. To see Dr. Please call to advise @ [phone]  Not schd yet.  [facility] Dr. Donell Beers sent req last Tue, the 11th

## 2023-02-28 NOTE — Progress Notes (Signed)
Date:  03/01/2023   ID:  Stacy, [name] Jul 16, 1951, MRN [id number]   Provider location: [facility] Type of Visit: Established patient   PCP:  Stacy Headings, MD  Cardiologist:  Dr. Shirlee Latch  HPI: [name] is a 72 y.o. 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   Admitted 3/2 ->11/17/16 with A/C diastolic CHF and A/C respiratory failure. Pt initially refused Bipap so venti mask used. Eventually tolerated transition to BiPAP. Overall pt diuresed from 285 lbs down to 229 lbs with 38 L of diuresis.  Echo 11/07/16 EF 55-60%, PASP 90mm Hg, mildly dilated RV with moderate to severely decreased RV systolic function. RHC/LHC in 3/18 showed no coronary disease and confirmed severe PH.    Echo in 7/20 showed EF 60-65%, mild LVH, normal RV, PASP 38 mmHg, mild AS. Echo in 8/21 showed EF 60-65%, mildly decreased RV function, severe biatrial enlargement, small PFO, unable to estimate PA systolic pressure.   Echo 1/23, EF 60-65%, mild LVH, normal RV, mild RV enlargement, severe biatrial enlargement, mild MR, no significant aortic stenosis.    Today she returns for HF follow up, last seen 8/23. Overall feeling fine. She has SOB carrying groceries, no SOB walking with her walker on flat ground. Her knee OA limits her physically. Denies palpitations, abnormal bleeding, CP, dizziness, edema, or PND/Orthopnea. Appetite ok. No fever or chills. Weight at home 239 pounds. Taking all medications. Wears 2L oxygen if she is walking at the gym. Planning lap chole soon w/ hernia repair, needs cardiac risk stratification. Has been taking Lasix 40 mg daily.  ECG (personally reviewed): atrial fibrillation w/ PVC, 57 bpm    Labs (1/19): K 3.7, creatinine 1.37 Labs (11/19): K 3.9, creatinine 1.2, LDL 67, hgb 13.2 Labs  (1/20): K 3.5, creatinine 1.34 Labs (5/20): K 3.9, creatinine 1.18, LDL 73 Labs (6/21): K 4.7, creatinine 1.19, LDL 71, hgb 13.7 Labs (11/21): K 4, creatinine 1.17, LDL 71 Labs (4/22): K 4.3, creatinine 1.34, LDL 59 Labs (9/22): K 3.9, creatinine 1.34, hgb 14.3 Labs (5/23): K 5.1, creatinine 1.28   PMH 1. 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 - RHC/LHC 11/07/16: No angiographic CAD; PA 90/55, LVEDP 23 (PCWP inaccurate), CI 2.15 Fick/2.32 Thermo, shunt run negative, PVR 5.7 WU.  - Echo (7/20): EF 60-65%, mild LVH, normal RV, PASP 38 mmHg, mild AS.  - Echo (8/21): EF 60-65%, mildly decreased RV function, severe biatrial enlargement, small PFO, unable to estimate PA systolic pressure. - Echo (1/23): EF 60-65%, mild LVH, normal RV, mild RV enlargement, severe biatrial enlargement, mild MR, no significant aortic stenosis.  2. Chronic hypercarbic/hypoxic respiratory failure with OHS/OSA. Sees Dr. Kendrick Fries. Using nightly BiPAP and oxygen by nasal cannula during the day.  3. Atrial fibrillation: Chronic - Rate controlled on Xarelto + atenolol  4. 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  PVR 5.7 WU on RHC 3/18.  Suspect group 2 (elevated LA pressure) and group 3 (OHS/OSA) PH. However, cannot rule out group 1 component.  She had V/Q scan that was not suggestive of chronic PE and high resolution chest CT that was not suggestive of ILD.  ANA, RF, anti-SCL70 all negative.   5. H/o CVA 6. Hypothyroidism 7. H/o TKR 8. Aortic stenosis: Mild on 7/20 echo. No evidence for significant stenosis on 1/23 echo.   Current Outpatient Medications  Medication Sig Dispense Refill   acetaZOLAMIDE (DIAMOX) 250 MG tablet Take 1 tablet (250 mg total) by mouth daily. NEEDS FOLLOW UP APPOINTMENT FOR MORE REFILLS 90 tablet 1    ADCIRCA 20 MG tablet TAKE 2 TABLETS BY MOUTH 1 TIME A DAY. 60 tablet 11   allopurinol (ZYLOPRIM) 100 MG tablet TAKE 1 TABLET BY MOUTH TWICE A DAY 180 tablet 0   atenolol (TENORMIN) 50 MG tablet Take 1 tablet (50 mg total) by mouth daily. NEEDS FOLLOW UP APPOINTMENT FOR MORE REFILLS 90 tablet 0   Cholecalciferol (VITAMIN D) 125 MCG (5000 UT) CAPS Take 5,000 Units by mouth 3 (three) times a week.      diclofenac sodium (VOLTAREN) 1 % GEL Apply 2 g topically 2 (two) times daily as needed (for arthritis).     empagliflozin (JARDIANCE) 10 MG TABS tablet Take 1 tablet (10 mg total) by mouth daily. 30 tablet 11   Ferrous Sulfate (SLOW FE PO) Take 1 tablet by mouth 3 (three) times a week.     furosemide (LASIX) 20 MG tablet Take 2 tablets (40 mg total) by mouth 2 (two) times daily. NEEDS FOLLOW UP APPOINTMENT FOR MORE REFILLS (Patient taking differently: Take 40 mg by mouth 2 (two) times daily. Patient takes 2 tablets by mouth in the morning and 1 tablet in the afternoon.) 180 tablet 0   levothyroxine (SYNTHROID) 75 MCG tablet TAKE 1 TABLET BY MOUTH EVERY DAY 90 tablet 1   Methylcobalamin (B12-ACTIVE PO) Take 1 tablet by mouth 2 (two) times a week.     potassium chloride (KLOR-CON) 10 MEQ tablet TAKE 2 TABLETS BY MOUTH 2 TIMES DAILY. 360 tablet 3   rivaroxaban (XARELTO) 20 MG TABS tablet Take 1 tablet (20 mg total) by mouth daily with supper. NEEDS FOLLOW UP APPOINTMENT FOR MORE REFILLS 100 tablet 0   rosuvastatin (CRESTOR) 5 MG tablet TAKE 1 TABLET (5 MG TOTAL) BY MOUTH DAILY. 90 tablet 3   traMADol (ULTRAM) 50 MG tablet TAKE 1 TABLET BY MOUTH 3 TIMES DAILY AS NEEDED. 15 tablet 1   No current facility-administered medications for this encounter.    Allergies:   Ambien [zolpidem tartrate], Losartan, Prevacid [lansoprazole], Adhesive [tape], Keflex [cephalexin], Motrin [ibuprofen], Prilosec [omeprazole], Ace inhibitors, Benadryl [diphenhydramine hcl], and Spironolactone   Social History:  The patient   reports that she has never smoked. She has never used smokeless tobacco. She reports current alcohol use of about 1.0 standard drink of alcohol per week. She reports that she does not use drugs.   Family History:  The patient's family history includes COPD in her mother; Diabetes in her father; Heart attack in her father; Hypertension in her brother, father, mother, and sister; Liver cancer in her father; Osteoporosis in her mother; Stroke in her maternal grandmother.   ROS:  Please see the history of present illness.   All other systems are personally reviewed and negative.   Wt Readings from Last 3 Encounters:  03/01/23 109.5 kg (241 lb 6.4 oz)  01/03/23 108 kg (238 lb)  12/13/22 108.7 kg (239 lb 9.6 oz)   BP 124/76   Pulse (!) 56   Wt 109.5 kg (241 lb 6.4 oz)   LMP 05/06/2013   SpO2 97%   BMI 47.15 kg/m   Physical Exam:  General:  NAD. No resp difficulty, walked into clinic with RW HEENT: Normal Neck: Supple. Thick neck, Carotids 2+ bilat; no bruits. No lymphadenopathy or thryomegaly appreciated. Cor: PMI nondisplaced. Irregular rate & rhythm. No rubs, gallops or murmurs. Lungs: Clear Abdomen: Obese, soft, nontender, nondistended. No hepatosplenomegaly. No bruits or masses. Good bowel sounds. Extremities: No cyanosis, clubbing, rash, edema Neuro: Alert & oriented x 3, cranial nerves grossly intact. Moves all 4 extremities w/o difficulty. Affect pleasant.  Recent Labs: 03/16/2022: TSH 2.24 04/29/2022: B Natriuretic Peptide 185.3; BUN 28; Creatinine, Ser 1.20; Potassium 4.2; Sodium 141  Personally reviewed   ASSESSMENT AND PLAN: 1. Chronic diastolic CHF:  Associated with severe pulmonary hypertension and prominent RV failure.  NYHA class II symptoms, unchanged.  Echo 1/23 showed EF 60-65%, mild LVH, normal RV, mild RV enlargement, severe biatrial enlargement, mild MR, no significant aortic stenosis. She is not volume overloaded today.   - Update echo. - Continue Lasix 40 mg  daily, OK to add 20 mg qpm for swelling/increased dyspnea. BMET today. - Continue Jardiance 10 mg daily.  2. Chronic hypercarbic/hypoxic respiratory failure with OHS/OSA:  Using bipap at night. She is not using oxygen during the day. - Compliant with Bipap nightly.  - Continue regular exercise and efforts at weight loss. - Continue pulmonary followup.  3. Atrial fibrillation: Chronic, rate controlled.  - Continue atenolol 50 mg daily.    - Continue Xarelto for anticoagulation. No bleeding issues. CBC today. 4. 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  PVR 5.7 WU by RHC in 3/18.  Suspect group 2 (elevated LA pressure) and group 3 (OHS/OSA) PH. However, cannot rule out group 1 component.  She had V/Q scan that was not suggestive of chronic PE and high resolution chest CT that was not suggestive of ILD.  ANA, RF, anti-SCL70 all negative.  She did not have a marked improvement from taking Adcirca => suspect predominantly group 2 and 3 PH.  Will hold off on additional pulmonary vasodilators. Echo 1/23 showed with mildly enlarged RV with normal systolic function, unable to estimate PA systolic pressure. - Continue Lasix. BNP today.  - Continue Adcirca 40 mg daily.  - Continue BiPAP at night for OSA, encouraged her to wear her oxygen with exertion.  - Update echo. 5. HTN: BP controlled generally.  6. Obesity: Body mass index is 47.15 kg/m. - Insurance would not cover semaglutide. 7. Pre-Op Risk Stratification: She is a moderate risk for surgery due to her RV failure, pulmonary HTN,  and respiratory issues, but not prohibitive. Higher risk due to her body habitus and OHS. Discussed with Dr. Shirlee Latch - Recommend pulmonary weigh in as well, she has an appt next week. - CHA2DS2-VASc Score = 7  - RCRI score: 3 pts, class IV risk suggesting 15% 30-day risk of death, MI, cardiac arrest - Hold Xarelto 2 days prior to surgery, resume afterwards as soon as safe to do  so.  Follow up in 3-4 months with Dr. Shirlee Latch + echo.  Signed, Jacklynn Ganong, FNP  03/01/2023  [facility] [address] [facility] [place] [address] [phone] (office) [phone] (fax)

## 2023-03-01 ENCOUNTER — Ambulatory Visit (HOSPITAL_COMMUNITY)
Admission: RE | Admit: 2023-03-01 | Discharge: 2023-03-01 | Disposition: A | Discharge status: Home / Self Care | Payer: PPO | Source: Ambulatory Visit | Attending: Family Medicine | Admitting: Family Medicine

## 2023-03-01 ENCOUNTER — Encounter (HOSPITAL_COMMUNITY): Payer: Self-pay

## 2023-03-01 VITALS — BP 124/76 | HR 56 | Wt 241.4 lb

## 2023-03-01 DIAGNOSIS — I482 Chronic atrial fibrillation, unspecified: Secondary | ICD-10-CM | POA: Insufficient documentation

## 2023-03-01 DIAGNOSIS — Z8673 Personal history of transient ischemic attack (TIA), and cerebral infarction without residual deficits: Secondary | ICD-10-CM | POA: Diagnosis not present

## 2023-03-01 DIAGNOSIS — J9611 Chronic respiratory failure with hypoxia: Secondary | ICD-10-CM | POA: Diagnosis not present

## 2023-03-01 DIAGNOSIS — I5032 Chronic diastolic (congestive) heart failure: Secondary | ICD-10-CM | POA: Diagnosis not present

## 2023-03-01 DIAGNOSIS — Z7901 Long term (current) use of anticoagulants: Secondary | ICD-10-CM | POA: Diagnosis not present

## 2023-03-01 DIAGNOSIS — Z8249 Family history of ischemic heart disease and other diseases of the circulatory system: Secondary | ICD-10-CM | POA: Insufficient documentation

## 2023-03-01 DIAGNOSIS — Z6841 Body Mass Index (BMI) 40.0 and over, adult: Secondary | ICD-10-CM | POA: Insufficient documentation

## 2023-03-01 DIAGNOSIS — I2721 Secondary pulmonary arterial hypertension: Secondary | ICD-10-CM | POA: Insufficient documentation

## 2023-03-01 DIAGNOSIS — Z79899 Other long term (current) drug therapy: Secondary | ICD-10-CM | POA: Insufficient documentation

## 2023-03-01 DIAGNOSIS — G4733 Obstructive sleep apnea (adult) (pediatric): Secondary | ICD-10-CM | POA: Insufficient documentation

## 2023-03-01 DIAGNOSIS — I110 Hypertensive heart disease with heart failure: Secondary | ICD-10-CM | POA: Insufficient documentation

## 2023-03-01 DIAGNOSIS — I10 Essential (primary) hypertension: Secondary | ICD-10-CM

## 2023-03-01 DIAGNOSIS — J9612 Chronic respiratory failure with hypercapnia: Secondary | ICD-10-CM | POA: Insufficient documentation

## 2023-03-01 DIAGNOSIS — Z01818 Encounter for other preprocedural examination: Secondary | ICD-10-CM

## 2023-03-01 DIAGNOSIS — I272 Pulmonary hypertension, unspecified: Secondary | ICD-10-CM

## 2023-03-01 DIAGNOSIS — J9610 Chronic respiratory failure, unspecified whether with hypoxia or hypercapnia: Secondary | ICD-10-CM

## 2023-03-01 LAB — BASIC METABOLIC PANEL
Anion gap: 7 (ref 5–15)
BUN: 23 mg/dL (ref 8–23)
CO2: 23 mmol/L (ref 22–32)
Calcium: 9.1 mg/dL (ref 8.9–10.3)
Chloride: 112 mmol/L — ABNORMAL HIGH (ref 98–111)
Creatinine, Ser: 1.26 mg/dL — ABNORMAL HIGH (ref 0.44–1.00)
GFR, Estimated: 45 mL/min — ABNORMAL LOW (ref >60)
Glucose, Bld: 99 mg/dL (ref 70–99)
Potassium: 4.4 mmol/L (ref 3.5–5.1)
Sodium: 142 mmol/L (ref 135–145)

## 2023-03-01 LAB — CBC
HCT: 45.1 % (ref 36.0–46.0)
Hemoglobin: 14.4 g/dL (ref 12.0–15.0)
MCH: 32.4 pg (ref 26.0–34.0)
MCHC: 31.9 g/dL (ref 30.0–36.0)
MCV: 101.6 fL — ABNORMAL HIGH (ref 80.0–100.0)
Platelets: 106 10*3/uL — ABNORMAL LOW (ref 150–400)
RBC: 4.44 MIL/uL (ref 3.87–5.11)
RDW: 14.3 % (ref 11.5–15.5)
WBC: 6 10*3/uL (ref 4.0–10.5)
nRBC: 0 % (ref 0.0–0.2)

## 2023-03-01 LAB — BRAIN NATRIURETIC PEPTIDE: B Natriuretic Peptide: 241.2 pg/mL — ABNORMAL HIGH (ref 0.0–100.0)

## 2023-03-01 MED ORDER — FUROSEMIDE 20 MG PO TABS: 40.0000 mg | ORAL_TABLET | Freq: Two times a day (BID) | ORAL | 3 refills | Status: DC

## 2023-03-01 MED ORDER — EMPAGLIFLOZIN 10 MG PO TABS: 10.0000 mg | ORAL_TABLET | Freq: Every day | ORAL | 11 refills | Status: DC

## 2023-03-01 MED ORDER — POTASSIUM CHLORIDE ER 10 MEQ PO TBCR: 20.0000 meq | EXTENDED_RELEASE_TABLET | Freq: Two times a day (BID) | ORAL | 3 refills | Status: DC

## 2023-03-01 MED ORDER — ROSUVASTATIN CALCIUM 5 MG PO TABS: 5.0000 mg | ORAL_TABLET | Freq: Every day | ORAL | 3 refills | Status: DC

## 2023-03-01 MED ORDER — ATENOLOL 50 MG PO TABS: 50.0000 mg | ORAL_TABLET | Freq: Every day | ORAL | 3 refills | Status: DC

## 2023-03-01 MED ORDER — ADCIRCA 20 MG PO TABS: ORAL_TABLET | ORAL | 3 refills | Status: DC

## 2023-03-01 NOTE — Progress Notes (Signed)
Medication Samples have been provided to the patient.  Drug name: Xarelto       Strength: 20mg         Qty: 4  LOT: [id number]  Exp.Date: 10/25  Dosing instructions: take 1 tab qd  The patient has been instructed regarding the correct time, dose, and frequency of taking this medication, including desired effects and most common side effects.   Pavle Wiler R Winda Summerall 10:11 AM 03/01/2023

## 2023-03-01 NOTE — Patient Instructions (Addendum)
EKG done today.  Labs done today. We will contact you only if your labs are abnormal.  All Heart Failure Medications have been refilled.   You have been provided samples of Xarelto.   Go to [email or web address] and you can enroll in "Xarelto with Me". This will help with the cost of your Xarelto.   No medication changes were made. Please continue all current medications as prescribed.  Your physician recommends that you schedule a follow-up appointment in: 3-4 months with Dr. Shirlee Latch with an echo prior to your appointment. Please contact our office in August to schedule a September/October appointment.   Your physician has requested that you have an echocardiogram. Echocardiography is a painless test that uses sound waves to create images of your heart. It provides your doctor with information about the size and shape of your heart and how well your heart's chambers and valves are working. This procedure takes approximately one hour. There are no restrictions for this procedure. Please do NOT wear cologne, perfume, aftershave, or lotions (deodorant is allowed). Please arrive 15 minutes prior to your appointment time.  If you have any questions or concerns before your next appointment please send [place] a message through [place] or call our office at [phone].    TO LEAVE A MESSAGE FOR THE NURSE SELECT OPTION 2, PLEASE LEAVE A MESSAGE INCLUDING: YOUR NAME DATE OF BIRTH CALL BACK NUMBER REASON FOR CALL**this is important as we prioritize the call backs  YOU WILL RECEIVE A CALL BACK THE SAME DAY AS LONG AS YOU CALL BEFORE 4:00 PM   Do the following things EVERYDAY: Weigh yourself in the morning before breakfast. Write it down and keep it in a log. Take your medicines as prescribed Eat low salt foods--Limit salt (sodium) to 2000 mg per day.  Stay as active as you can everyday Limit all fluids for the day to less than 2 liters   At the [facility], you and your health needs  are our priority. As part of our continuing mission to provide you with exceptional heart care, we have created designated Provider Care Teams. These Care Teams include your primary Cardiologist (physician) and Advanced Practice Providers (APPs- Physician Assistants and Nurse Practitioners) who all work together to provide you with the care you need, when you need it.   You may see any of the following providers on your designated Care Team at your next follow up: Dr Arvilla Meres Dr Marca Ancona Dr. Marcos Eke, NP Robbie Lis, [place] [facility] [place], [place] Brynda Peon, NP Karle Plumber, PharmD   Please be sure to bring in all your medications bottles to every appointment.    Thank you for choosing [facility]

## 2023-03-01 NOTE — Telephone Encounter (Signed)
Will leave open until patient is seen and clearance sent.

## 2023-03-01 NOTE — Telephone Encounter (Signed)
Patient scheduled to see Rubye Oaks NP on 03/07/2023 for surgical clearance.  Nothing further needed.

## 2023-03-02 ENCOUNTER — Other Ambulatory Visit: Payer: Self-pay | Admitting: Internal Medicine

## 2023-03-02 DIAGNOSIS — L219 Seborrheic dermatitis, unspecified: Secondary | ICD-10-CM | POA: Diagnosis not present

## 2023-03-02 DIAGNOSIS — L821 Other seborrheic keratosis: Secondary | ICD-10-CM | POA: Diagnosis not present

## 2023-03-02 DIAGNOSIS — D239 Other benign neoplasm of skin, unspecified: Secondary | ICD-10-CM | POA: Diagnosis not present

## 2023-03-02 DIAGNOSIS — R202 Paresthesia of skin: Secondary | ICD-10-CM | POA: Diagnosis not present

## 2023-03-02 DIAGNOSIS — I872 Venous insufficiency (chronic) (peripheral): Secondary | ICD-10-CM | POA: Diagnosis not present

## 2023-03-02 DIAGNOSIS — Z85828 Personal history of other malignant neoplasm of skin: Secondary | ICD-10-CM | POA: Diagnosis not present

## 2023-03-02 DIAGNOSIS — D485 Neoplasm of uncertain behavior of skin: Secondary | ICD-10-CM | POA: Diagnosis not present

## 2023-03-02 DIAGNOSIS — C44629 Squamous cell carcinoma of skin of left upper limb, including shoulder: Secondary | ICD-10-CM | POA: Diagnosis not present

## 2023-03-02 DIAGNOSIS — L570 Actinic keratosis: Secondary | ICD-10-CM | POA: Diagnosis not present

## 2023-03-02 DIAGNOSIS — L814 Other melanin hyperpigmentation: Secondary | ICD-10-CM | POA: Diagnosis not present

## 2023-03-02 DIAGNOSIS — D1801 Hemangioma of skin and subcutaneous tissue: Secondary | ICD-10-CM | POA: Diagnosis not present

## 2023-03-02 DIAGNOSIS — L578 Other skin changes due to chronic exposure to nonionizing radiation: Secondary | ICD-10-CM | POA: Diagnosis not present

## 2023-03-07 ENCOUNTER — Encounter: Payer: Self-pay | Admitting: Adult Health

## 2023-03-07 ENCOUNTER — Ambulatory Visit (INDEPENDENT_AMBULATORY_CARE_PROVIDER_SITE_OTHER): Payer: PPO

## 2023-03-07 ENCOUNTER — Ambulatory Visit: Payer: PPO | Admitting: Adult Health

## 2023-03-07 VITALS — BP 130/70 | HR 69 | Temp 97.7°F | Ht 60.0 in | Wt 239.2 lb

## 2023-03-07 DIAGNOSIS — Z96611 Presence of right artificial shoulder joint: Secondary | ICD-10-CM | POA: Diagnosis not present

## 2023-03-07 DIAGNOSIS — Z01818 Encounter for other preprocedural examination: Secondary | ICD-10-CM

## 2023-03-07 DIAGNOSIS — I5032 Chronic diastolic (congestive) heart failure: Secondary | ICD-10-CM | POA: Diagnosis not present

## 2023-03-07 DIAGNOSIS — I272 Pulmonary hypertension, unspecified: Secondary | ICD-10-CM | POA: Diagnosis not present

## 2023-03-07 DIAGNOSIS — G4733 Obstructive sleep apnea (adult) (pediatric): Secondary | ICD-10-CM

## 2023-03-07 DIAGNOSIS — Z96612 Presence of left artificial shoulder joint: Secondary | ICD-10-CM | POA: Diagnosis not present

## 2023-03-07 DIAGNOSIS — J9611 Chronic respiratory failure with hypoxia: Secondary | ICD-10-CM | POA: Diagnosis not present

## 2023-03-07 DIAGNOSIS — I517 Cardiomegaly: Secondary | ICD-10-CM | POA: Diagnosis not present

## 2023-03-07 DIAGNOSIS — J9610 Chronic respiratory failure, unspecified whether with hypoxia or hypercapnia: Secondary | ICD-10-CM | POA: Diagnosis not present

## 2023-03-07 NOTE — Assessment & Plan Note (Signed)
Pulmonary preop clearance-patient has a moderate to high surgical risk as with her age and underlying multiple comorbidities including OSA/OHS with pulmonary hypertension and diastolic heart failure. She is not excluded from surgery may proceed with the following precautions. She remains independent is on very little oxygen and has excellent compliance and control on nocturnal CPAP. ° ° °Major Pulmonary risks identified in the multifactorial risk analysis are but not limited to a) pneumonia; b) recurrent intubation risk; c) prolonged or recurrent acute respiratory failure needing mechanical ventilation; d) prolonged hospitalization; e) DVT/Pulmonary embolism; f) Acute Pulmonary edema °Check chest x-ray today ° °Recommend °1. Short duration of surgery as much as possible and avoid paralytic if possible °2. Recovery in step down or ICU with Pulmonary consultation if indicated.  °3. DVT prophylaxis as indicated.  °4. Aggressive pulmonary toilet with o2, bronchodilatation, and incentive spirometry and early ambulation °5. CPAP At bedtime  And post op setting as needed.  ° ° ° °

## 2023-03-07 NOTE — Assessment & Plan Note (Signed)
Doing well on current regimen. Followed by Cardiology .  Continue on CPAP , O2 , diuretics , adcirca .   Plan  Patient Instructions  Chest xray today.  Continue on CPAP  At bedtime  Continue on Oxygen 2l/m with activity As needed  , goal is for Oxygen level >88-90%  Work on healthy weight  Do not drive if sleepy.  Continue with follow up with Cardiology as planned.  Continue on Furosemide and Diamox .  Low salt diet.  Continue on Adcirca  Good luck with upcoming surgery.  Follow up with Dr Melida Quitter Clent Ridges NP in 6 months.  Please contact office for sooner follow up if symptoms do not improve or worsen or seek emergency care

## 2023-03-07 NOTE — Assessment & Plan Note (Signed)
Patient is to continue on oxygen with activity to maintain O2 saturations greater than 88 to 90%.  Has had no recent increased oxygen demands.  Rarely has to use her oxygen.  She periodically checks her O2 saturations at home and says that her oxygen levels stay above 90%.
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
## 2023-03-07 NOTE — Patient Instructions (Addendum)
Chest xray today.  Continue on CPAP  At bedtime  Continue on Oxygen 2l/m with activity As needed  , goal is for Oxygen level >88-90%  Work on healthy weight  Do not drive if sleepy.  Continue with follow up with Cardiology as planned.  Continue on Furosemide and Diamox .  Low salt diet.  Continue on Adcirca  Good luck with upcoming surgery.  Follow up with [name] in 6 months.  Please contact office for sooner follow up if symptoms do not improve or worsen or seek emergency care

## 2023-03-07 NOTE — Assessment & Plan Note (Signed)
Control compliance on nocturnal CPAP with oxygen.  Continue on current settings.  Plan  Patient Instructions  Chest xray today.  Continue on CPAP  At bedtime  Continue on Oxygen 2l/m with activity As needed  , goal is for Oxygen level >88-90%  Work on healthy weight  Do not drive if sleepy.  Continue with follow up with Cardiology as planned.  Continue on Furosemide and Diamox .  Low salt diet.  Continue on Adcirca  Good luck with upcoming surgery.  Follow up with Dr Melida Quitter Clent Ridges NP in 6 months.  Please contact office for sooner follow up if symptoms do not improve or worsen or seek emergency care

## 2023-03-07 NOTE — Assessment & Plan Note (Signed)
Appears euvolemic on exam. Continue on current regimen and follow-up with cardiology 

## 2023-03-12 ENCOUNTER — Other Ambulatory Visit: Payer: Self-pay | Admitting: Internal Medicine

## 2023-03-14 NOTE — Telephone Encounter (Signed)
Clearance has been faxed back to [place] [facility]. Closing encounter.NFN

## 2023-04-01 ENCOUNTER — Other Ambulatory Visit (HOSPITAL_COMMUNITY): Payer: Self-pay | Admitting: Cardiology

## 2023-04-03 ENCOUNTER — Other Ambulatory Visit (HOSPITAL_COMMUNITY): Payer: Self-pay

## 2023-04-05 ENCOUNTER — Encounter (INDEPENDENT_AMBULATORY_CARE_PROVIDER_SITE_OTHER): Payer: Self-pay

## 2023-04-06 DIAGNOSIS — L570 Actinic keratosis: Secondary | ICD-10-CM | POA: Diagnosis not present

## 2023-04-06 DIAGNOSIS — C44629 Squamous cell carcinoma of skin of left upper limb, including shoulder: Secondary | ICD-10-CM | POA: Diagnosis not present

## 2023-04-07 DIAGNOSIS — Z7901 Long term (current) use of anticoagulants: Secondary | ICD-10-CM | POA: Diagnosis not present

## 2023-04-07 DIAGNOSIS — G4733 Obstructive sleep apnea (adult) (pediatric): Secondary | ICD-10-CM | POA: Diagnosis not present

## 2023-04-07 DIAGNOSIS — K8020 Calculus of gallbladder without cholecystitis without obstruction: Secondary | ICD-10-CM | POA: Diagnosis not present

## 2023-04-07 DIAGNOSIS — I272 Pulmonary hypertension, unspecified: Secondary | ICD-10-CM | POA: Diagnosis not present

## 2023-04-07 DIAGNOSIS — I700 Atherosclerosis of aorta: Secondary | ICD-10-CM | POA: Diagnosis not present

## 2023-04-07 DIAGNOSIS — I5032 Chronic diastolic (congestive) heart failure: Secondary | ICD-10-CM | POA: Diagnosis not present

## 2023-04-07 DIAGNOSIS — I482 Chronic atrial fibrillation, unspecified: Secondary | ICD-10-CM | POA: Diagnosis not present

## 2023-04-10 ENCOUNTER — Other Ambulatory Visit (HOSPITAL_COMMUNITY): Payer: Self-pay

## 2023-04-10 ENCOUNTER — Telehealth (HOSPITAL_COMMUNITY): Payer: Self-pay

## 2023-04-10 NOTE — Telephone Encounter (Signed)
Advanced Heart Failure Patient Advocate Encounter  Patient called with concerns about Xarelto copay. Discussed Xarelto With Me program and criteria, provided samples.  Medication Samples have been left at registration desk for patient pick up. Drug name: Xarelto 20 MG Qty: 4x 7 ct package LOT: [id number] Exp.: 06/2024 SIG: Take 1 tablet by mouth once daily   The patient has been instructed regarding the correct time, dose, and frequency of taking this medication, including desired effects and most common side effects.   Burnell Blanks, CPhT Rx Patient Advocate Phone: [phone]

## 2023-04-20 DIAGNOSIS — J9610 Chronic respiratory failure, unspecified whether with hypoxia or hypercapnia: Secondary | ICD-10-CM | POA: Diagnosis not present

## 2023-04-20 DIAGNOSIS — Z96652 Presence of left artificial knee joint: Secondary | ICD-10-CM | POA: Diagnosis not present

## 2023-04-20 DIAGNOSIS — G4733 Obstructive sleep apnea (adult) (pediatric): Secondary | ICD-10-CM | POA: Diagnosis not present

## 2023-04-25 ENCOUNTER — Telehealth: Payer: Self-pay | Admitting: Adult Health

## 2023-04-25 NOTE — Telephone Encounter (Signed)
Patient is calling to see if the clearance that she received for gallbladder surgery would be good enough for her knee surgery or does she need to come back in to get clearance.No general anastasia they would use a spinal block.Please call and advise. [phone]

## 2023-04-26 NOTE — Telephone Encounter (Signed)
Tammy please advise if last OV can be used for Surgical clearance

## 2023-04-27 NOTE — Telephone Encounter (Signed)
Preop clearance was done last month will also cover knee surgery is done in near future . As long as nothing has changed since I last saw her. When is knee surgery

## 2023-04-28 NOTE — Telephone Encounter (Signed)
Surgery date is pending. Patient meets with Dr. Lovey Newcomer at [facility] next week to make final arrangements for procedure. Patient is hoping to get this done pretty soon She states she is doing very well

## 2023-05-02 ENCOUNTER — Telehealth: Payer: PPO | Admitting: Family Medicine

## 2023-05-02 ENCOUNTER — Encounter: Payer: Self-pay | Admitting: Family Medicine

## 2023-05-02 VITALS — BP 134/73 | Temp 98.6°F | Ht 60.0 in | Wt 229.0 lb

## 2023-05-02 DIAGNOSIS — Z0000 Encounter for general adult medical examination without abnormal findings: Secondary | ICD-10-CM | POA: Diagnosis not present

## 2023-05-02 NOTE — Patient Instructions (Signed)
I really enjoyed getting to talk with you today! I am available on Tuesdays and Thursdays for virtual visits if you have any questions or concerns, or if I can be of any further assistance.   CHECKLIST FROM ANNUAL WELLNESS VISIT:  -Follow up (please call to schedule if not scheduled after visit):   -yearly for annual wellness visit with primary care office  Here is a list of your preventive care/health maintenance measures and the plan for each if any are due:  PLAN For any measures below that may be due:  -call your gastroenterologist to schedule visit about bowels and the colon cancer screening -can get the updated covid and flu vaccines at the pharmacy   Health Maintenance  Topic Date Due   Fecal DNA (Cologuard)  03/30/2023   COVID-19 Vaccine (7 - 2023-24 season) 06/13/2023 (Originally 07/25/2022)   INFLUENZA VACCINE  12/04/2023 (Originally 04/06/2023)   MAMMOGRAM  06/18/2023   DTaP/Tdap/Td (3 - Td or Tdap) 07/17/2023   Medicare Annual Wellness (AWV)  05/01/2024   Pneumonia Vaccine 43+ Years old  Completed   DEXA SCAN  Completed   Hepatitis C Screening  Completed   Zoster Vaccines- Shingrix  Completed   HPV VACCINES  Aged Out    -See a dentist at least yearly  -Get your eyes checked and then per your eye specialist's recommendations  -Other issues addressed today:   -I have included below further information regarding a healthy whole foods based diet, physical activity guidelines for adults, stress management and opportunities for social connections. I hope you find this information useful.   -----------------------------------------------------------------------------------------------------------------------------------------------------------------------------------------------------------------------------------------------------------  NUTRITION: -eat real food: lots of colorful vegetables (half the plate) and fruits -5-7 servings of vegetables and fruits per day  (fresh or steamed is best), exp. 2 servings of vegetables with lunch and dinner and 2 servings of fruit per day. Berries and greens such as kale and collards are great choices.  -consume on a regular basis: whole grains (make sure first ingredient on label contains the word "whole"), fresh fruits, fish, nuts, seeds, healthy oils (such as olive oil, avocado oil, grape seed oil) -may eat small amounts of dairy and lean meat on occasion, but avoid processed meats such as ham, bacon, lunch meat, etc. -drink water -try to avoid fast food and pre-packaged foods, processed meat -most experts advise limiting sodium to < 2300mg  per day, should limit further is any chronic conditions such as high blood pressure, heart disease, diabetes, etc. The American Heart Association advised that < 1500mg  is is ideal -try to avoid foods that contain any ingredients with names you do not recognize  -try to avoid sugar/sweets (except for the natural sugar that occurs in fresh fruit) -try to avoid sweet drinks -try to avoid white rice, white bread, pasta (unless whole grain), white or yellow potatoes  EXERCISE GUIDELINES FOR ADULTS: -if you wish to increase your physical activity, do so gradually and with the approval of your doctor -STOP and seek medical care immediately if you have any chest pain, chest discomfort or trouble breathing when starting or increasing exercise  -move and stretch your body, legs, feet and arms when sitting for long periods -Physical activity guidelines for optimal health in adults: -least 150 minutes per week of aerobic exercise (can talk, but not sing) once approved by your doctor, 20-30 minutes of sustained activity or two 10 minute episodes of sustained activity every day.  -resistance training at least 2 days per week if approved by your doctor -balance  exercises 3+ days per week:   Stand somewhere where you have something sturdy to hold onto if you lose balance.    1) lift up on toes,  start with 5x per day and work up to 20x   2) stand and lift on leg straight out to the side so that foot is a few inches of the floor, start with 5x each side and work up to 20x each side   3) stand on one foot, start with 5 seconds each side and work up to 20 seconds on each side  If you need ideas or help with getting more active:  -Silver sneakers https://tools.silversneakers.com  -Walk with a Doc: http://www.duncan-williams.com/  -try to include resistance (weight lifting/strength building) and balance exercises twice per week: or the following link for ideas: http://castillo-powell.com/  BuyDucts.dk  STRESS MANAGEMENT: -can try meditating, or just sitting quietly with deep breathing while intentionally relaxing all parts of your body for 5 minutes daily -if you need further help with stress, anxiety or depression please follow up with your primary doctor or contact the wonderful folks at [facility]: [phone]  SOCIAL CONNECTIONS: -options in [place] if you wish to engage in more social and exercise related activities:  -Silver sneakers https://tools.silversneakers.com  -Walk with a Doc: http://www.duncan-williams.com/  -Check out the [facility] Active Adults 50+ section on the [place] of [employer] (hiking clubs, book clubs, cards and games, chess, exercise classes, aquatic classes and much more) - see the website for details: https://www.[place].gov/departments/parks-recreation/active-adults50  -YouTube has lots of exercise videos for different ages and abilities as well  -[facility] (a variety of indoor and outdoor inperson activities for adults). [phone]. [address]  -Virtual Online Classes (a variety of topics): see seniorplanet.org or call [phone]  -consider volunteering at a school, hospice center, church, senior center or  elsewhere
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
## 2023-05-03 DIAGNOSIS — Z96651 Presence of right artificial knee joint: Secondary | ICD-10-CM | POA: Diagnosis not present

## 2023-05-03 DIAGNOSIS — Z4789 Encounter for other orthopedic aftercare: Secondary | ICD-10-CM | POA: Diagnosis not present

## 2023-05-05 ENCOUNTER — Telehealth: Payer: Self-pay | Admitting: *Deleted

## 2023-05-05 NOTE — Telephone Encounter (Signed)
Per [facility] the pt has been scheduled for pre op appt 06/19/23 Dr. Shirlee Latch. Pt has echo same day as appt with MD.   I will update the surgeon's office.

## 2023-05-05 NOTE — Telephone Encounter (Signed)
   Name: Arielys Mathurin  DOB: Jul 12, 1951  MRN: [id number]  Primary Cardiologist: None  Chart reviewed as part of pre-operative protocol coverage. Because of Caitlinn Straughan [place]'s past medical history and time since last visit, she will require a follow-up in-office visit in order to better assess preoperative cardiovascular risk (pt follows with [facility] clinic-due for f/u with Dr. Shirlee Latch).   Pre-op covering staff: - Please schedule appointment and call patient to inform them. If patient already had an upcoming appointment within acceptable timeframe, please add "pre-op clearance" to the appointment notes so provider is aware. - Please contact requesting surgeon's office via preferred method (i.e, phone, fax) to inform them of need for appointment prior to surgery.  This message will also be routed to pharmacy pool for input on holding Xarelto as requested below so that this information is available to the clearing provider at time of patient's appointment.   Joylene Grapes, NP  05/05/2023, 12:02 PM

## 2023-05-05 NOTE — Telephone Encounter (Signed)
   Pre-operative Risk Assessment    Patient Name: [name]  DOB: Sep 29, 1950 MRN: [id number]    DATE OF LAST VISIT: 03/01/23 JESSICA MILFORD, FNP DATE OF NEXT VISIT: NONE  Request for Surgical Clearance    Procedure:   RIGHT KNEE POLY vs TOTAL KNEE REVISION  Date of Surgery:  Clearance 08/07/23  [name]                               Surgeon:  DR. Ollen Gross Surgeon's Group or Practice Name:  [facility] Phone number:  [phone] ATTN: Aida Raider Fax number:  [phone]   Type of Clearance Requested:   - Medical  - Pharmacy:  Hold Rivaroxaban (Xarelto)     Type of Anesthesia:   CHOICE   Additional requests/questions:    [name]   05/05/2023, 10:40 AM

## 2023-05-05 NOTE — Telephone Encounter (Signed)
Patient with diagnosis of afib on Xarelto for anticoagulation.    Procedure: RIGHT KNEE POLY vs TOTAL KNEE REVISION Date of procedure: 08/07/23 - although pt is on cancellation list   CHA2DS2-VASc Score = 7  This indicates a 11.2% annual risk of stroke. The patient's score is based upon: CHF History: 1 HTN History: 1 Diabetes History: 0 Stroke History: 2 Vascular Disease History: 1 Age Score: 1 Gender Score: 1  Stroke in 2010 and 2012  CrCl 43 ml/min using adjusted body weight Platelet count 106  Pt is at elevated risk off of anticoag given elevated CHADS2VASc score and hx of prior strokes. She'll need to hold her Xarelto for 3 days prior to surgery. Will forward to MD to confirm this is acceptable given her elevated CV risk.   **This guidance is not considered finalized until pre-operative APP has relayed final recommendations.**

## 2023-05-05 NOTE — Telephone Encounter (Signed)
Will forward notes to [facility] per pre op APP pt needs in office appt with Dr. Shirlee Latch or APP for pre op clearance. I will update all parties involved.

## 2023-05-10 NOTE — Telephone Encounter (Signed)
OV notes and clearance form have been faxed back to [facility]. Nothing further needed at this time. ?

## 2023-05-30 ENCOUNTER — Ambulatory Visit: Payer: PPO | Admitting: Internal Medicine

## 2023-05-30 ENCOUNTER — Encounter: Payer: Self-pay | Admitting: Internal Medicine

## 2023-05-30 VITALS — BP 114/60 | HR 76 | Temp 97.7°F | Ht 60.0 in | Wt 229.0 lb

## 2023-05-30 DIAGNOSIS — Z01818 Encounter for other preprocedural examination: Secondary | ICD-10-CM

## 2023-05-30 DIAGNOSIS — I482 Chronic atrial fibrillation, unspecified: Secondary | ICD-10-CM

## 2023-05-30 DIAGNOSIS — E039 Hypothyroidism, unspecified: Secondary | ICD-10-CM

## 2023-05-30 DIAGNOSIS — G8929 Other chronic pain: Secondary | ICD-10-CM | POA: Diagnosis not present

## 2023-05-30 DIAGNOSIS — R7303 Prediabetes: Secondary | ICD-10-CM | POA: Diagnosis not present

## 2023-05-30 DIAGNOSIS — M25561 Pain in right knee: Secondary | ICD-10-CM

## 2023-05-30 DIAGNOSIS — Z9889 Other specified postprocedural states: Secondary | ICD-10-CM

## 2023-05-30 DIAGNOSIS — Z7901 Long term (current) use of anticoagulants: Secondary | ICD-10-CM | POA: Diagnosis not present

## 2023-05-30 DIAGNOSIS — Z79899 Other long term (current) drug therapy: Secondary | ICD-10-CM | POA: Diagnosis not present

## 2023-05-30 DIAGNOSIS — D696 Thrombocytopenia, unspecified: Secondary | ICD-10-CM

## 2023-05-30 DIAGNOSIS — I272 Pulmonary hypertension, unspecified: Secondary | ICD-10-CM

## 2023-05-30 DIAGNOSIS — E785 Hyperlipidemia, unspecified: Secondary | ICD-10-CM | POA: Diagnosis not present

## 2023-05-30 DIAGNOSIS — I5032 Chronic diastolic (congestive) heart failure: Secondary | ICD-10-CM

## 2023-05-30 NOTE — Patient Instructions (Signed)
Good   to see you today.  Update some labs .  Today  will share with   surgical team .
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
## 2023-05-31 ENCOUNTER — Telehealth: Payer: Self-pay

## 2023-05-31 LAB — HEPATIC FUNCTION PANEL
ALT: 7 U/L (ref 0–35)
AST: 18 U/L (ref 0–37)
Albumin: 4 g/dL (ref 3.5–5.2)
Alkaline Phosphatase: 77 U/L (ref 39–117)
Bilirubin, Direct: 0.1 mg/dL (ref 0.0–0.3)
Total Bilirubin: 0.7 mg/dL (ref 0.2–1.2)
Total Protein: 6.4 g/dL (ref 6.0–8.3)

## 2023-05-31 LAB — CBC WITH DIFFERENTIAL/PLATELET
Basophils Absolute: 0 10*3/uL (ref 0.0–0.1)
Basophils Relative: 0.8 % (ref 0.0–3.0)
Eosinophils Absolute: 0.2 10*3/uL (ref 0.0–0.7)
Eosinophils Relative: 3.7 % (ref 0.0–5.0)
HCT: 43.3 % (ref 36.0–46.0)
Hemoglobin: 14 g/dL (ref 12.0–15.0)
Lymphocytes Relative: 14.7 % (ref 12.0–46.0)
Lymphs Abs: 0.8 10*3/uL (ref 0.7–4.0)
MCHC: 32.3 g/dL (ref 30.0–36.0)
MCV: 102.5 fl — ABNORMAL HIGH (ref 78.0–100.0)
Monocytes Absolute: 0.4 10*3/uL (ref 0.1–1.0)
Monocytes Relative: 6.4 % (ref 3.0–12.0)
Neutro Abs: 4.2 10*3/uL (ref 1.4–7.7)
Neutrophils Relative %: 74.4 % (ref 43.0–77.0)
Platelets: 126 10*3/uL — ABNORMAL LOW (ref 150.0–400.0)
RBC: 4.22 Mil/uL (ref 3.87–5.11)
RDW: 14.9 % (ref 11.5–15.5)
WBC: 5.7 10*3/uL (ref 4.0–10.5)

## 2023-05-31 LAB — LIPID PANEL
Cholesterol: 139 mg/dL (ref 0–200)
HDL: 56.3 mg/dL (ref >39.00)
LDL Cholesterol: 63 mg/dL (ref 0–99)
NonHDL: 82.9
Total CHOL/HDL Ratio: 2
Triglycerides: 100 mg/dL (ref 0.0–149.0)
VLDL: 20 mg/dL (ref 0.0–40.0)

## 2023-05-31 LAB — BASIC METABOLIC PANEL
BUN: 27 mg/dL — ABNORMAL HIGH (ref 6–23)
CO2: 26 mEq/L (ref 19–32)
Calcium: 9.1 mg/dL (ref 8.4–10.5)
Chloride: 107 mEq/L (ref 96–112)
Creatinine, Ser: 1.37 mg/dL — ABNORMAL HIGH (ref 0.40–1.20)
GFR: 38.23 mL/min — ABNORMAL LOW (ref >60.00)
Glucose, Bld: 98 mg/dL (ref 70–99)
Potassium: 3.6 mEq/L (ref 3.5–5.1)
Sodium: 142 mEq/L (ref 135–145)

## 2023-05-31 LAB — HEMOGLOBIN A1C: Hgb A1c MFr Bld: 5.8 % (ref 4.6–6.5)

## 2023-05-31 LAB — TSH: TSH: 1.05 u[IU]/mL (ref 0.35–5.50)

## 2023-05-31 NOTE — Telephone Encounter (Signed)
Surgical clearance form was faxed.  Received a confirmation.

## 2023-06-01 NOTE — Progress Notes (Signed)
Thyroid is in range  platelets are up to . 126 and no anemia Kidney function gfr still decrease 38 range  about the same range . Ldl is in good range , 63 , A1c is 5.8  good. Will forward info to surgery office

## 2023-06-05 ENCOUNTER — Other Ambulatory Visit (HOSPITAL_COMMUNITY): Payer: Self-pay | Admitting: Cardiology

## 2023-06-05 ENCOUNTER — Other Ambulatory Visit: Payer: Self-pay | Admitting: Internal Medicine

## 2023-06-07 ENCOUNTER — Encounter: Payer: Self-pay | Admitting: Physician Assistant

## 2023-06-07 ENCOUNTER — Ambulatory Visit: Payer: PPO | Admitting: Physician Assistant

## 2023-06-07 VITALS — BP 120/72 | HR 76 | Ht 60.0 in | Wt 230.2 lb

## 2023-06-07 DIAGNOSIS — K429 Umbilical hernia without obstruction or gangrene: Secondary | ICD-10-CM

## 2023-06-07 DIAGNOSIS — K8020 Calculus of gallbladder without cholecystitis without obstruction: Secondary | ICD-10-CM

## 2023-06-07 DIAGNOSIS — R140 Abdominal distension (gaseous): Secondary | ICD-10-CM
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
## 2023-06-07 NOTE — Patient Instructions (Signed)
_______________________________________________________  If your blood pressure at your visit was 140/90 or greater, please contact your primary care physician to follow up on this.  _______________________________________________________  If you are age 74 or older, your body mass index should be between 23-30. Your Body mass index is 44.97 kg/m. If this is out of the aforementioned range listed, please consider follow up with your Primary Care Provider.  If you are age 62 or younger, your body mass index should be between 19-25. Your Body mass index is 44.97 kg/m. If this is out of the aformentioned range listed, please consider follow up with your Primary Care Provider.   ________________________________________________________  The [facility] providers would like to encourage you to use [facility] to communicate with providers for non-urgent requests or questions.  Due to long hold times on the telephone, sending your provider a message by [facility] may be a faster and more efficient way to get a response.  Please allow 48 business hours for a response.  Please remember that this is for non-urgent requests.  _______________________________________________________  Please see your surgeon. Follow up as needed  Please call with any questions or concerns  It was a pleasure to see you today!  Thank you for trusting me with your gastrointestinal care!

## 2023-06-19 ENCOUNTER — Other Ambulatory Visit (HOSPITAL_COMMUNITY): Payer: PPO

## 2023-06-19 ENCOUNTER — Encounter (HOSPITAL_COMMUNITY): Payer: PPO | Admitting: Cardiology

## 2023-06-28 ENCOUNTER — Encounter (HOSPITAL_COMMUNITY): Payer: Self-pay | Admitting: Cardiology

## 2023-06-28 ENCOUNTER — Other Ambulatory Visit (HOSPITAL_COMMUNITY): Payer: Self-pay

## 2023-06-28 ENCOUNTER — Ambulatory Visit (HOSPITAL_BASED_OUTPATIENT_CLINIC_OR_DEPARTMENT_OTHER)
Admission: RE | Admit: 2023-06-28 | Discharge: 2023-06-28 | Disposition: A | Discharge status: Home / Self Care | Payer: PPO | Source: Ambulatory Visit | Attending: *Deleted | Admitting: *Deleted

## 2023-06-28 ENCOUNTER — Telehealth (HOSPITAL_COMMUNITY): Payer: Self-pay | Admitting: Pharmacy Technician

## 2023-06-28 ENCOUNTER — Ambulatory Visit (HOSPITAL_COMMUNITY)
Admission: RE | Admit: 2023-06-28 | Discharge: 2023-06-28 | Disposition: A | Discharge status: Home / Self Care | Payer: PPO | Source: Ambulatory Visit | Attending: Cardiology | Admitting: Cardiology

## 2023-06-28 VITALS — BP 120/70 | HR 53 | Wt 230.2 lb

## 2023-06-28 DIAGNOSIS — I493 Ventricular premature depolarization: Secondary | ICD-10-CM | POA: Diagnosis not present

## 2023-06-28 DIAGNOSIS — I5032 Chronic diastolic (congestive) heart failure: Secondary | ICD-10-CM | POA: Diagnosis not present

## 2023-06-28 DIAGNOSIS — Z7984 Long term (current) use of oral hypoglycemic drugs: Secondary | ICD-10-CM | POA: Insufficient documentation

## 2023-06-28 DIAGNOSIS — Z7901 Long term (current) use of anticoagulants: Secondary | ICD-10-CM | POA: Diagnosis not present

## 2023-06-28 DIAGNOSIS — G4733 Obstructive sleep apnea (adult) (pediatric): Secondary | ICD-10-CM | POA: Insufficient documentation

## 2023-06-28 DIAGNOSIS — I080 Rheumatic disorders of both mitral and aortic valves: Secondary | ICD-10-CM | POA: Diagnosis not present

## 2023-06-28 DIAGNOSIS — Z79899 Other long term (current) drug therapy: Secondary | ICD-10-CM | POA: Diagnosis not present

## 2023-06-28 DIAGNOSIS — I482 Chronic atrial fibrillation, unspecified: Secondary | ICD-10-CM | POA: Diagnosis not present

## 2023-06-28 DIAGNOSIS — I110 Hypertensive heart disease with heart failure: Secondary | ICD-10-CM | POA: Diagnosis not present

## 2023-06-28 DIAGNOSIS — J9612 Chronic respiratory failure with hypercapnia: Secondary | ICD-10-CM | POA: Insufficient documentation

## 2023-06-28 DIAGNOSIS — J9611 Chronic respiratory failure with hypoxia: Secondary | ICD-10-CM | POA: Insufficient documentation

## 2023-06-28 DIAGNOSIS — Z6841 Body Mass Index (BMI) 40.0 and over, adult: Secondary | ICD-10-CM | POA: Insufficient documentation

## 2023-06-28 DIAGNOSIS — K429 Umbilical hernia without obstruction or gangrene: Secondary | ICD-10-CM | POA: Insufficient documentation

## 2023-06-28 DIAGNOSIS — I2721 Secondary pulmonary arterial hypertension: Secondary | ICD-10-CM | POA: Diagnosis not present

## 2023-06-28 DIAGNOSIS — M25561 Pain in right knee: Secondary | ICD-10-CM | POA: Insufficient documentation

## 2023-06-28 DIAGNOSIS — Z8673 Personal history of transient ischemic attack (TIA), and cerebral infarction without residual deficits: Secondary | ICD-10-CM | POA: Insufficient documentation

## 2023-06-28 LAB — BASIC METABOLIC PANEL
Anion gap: 9 (ref 5–15)
BUN: 24 mg/dL — ABNORMAL HIGH (ref 8–23)
CO2: 24 mmol/L (ref 22–32)
Calcium: 9.5 mg/dL (ref 8.9–10.3)
Chloride: 111 mmol/L (ref 98–111)
Creatinine, Ser: 1.15 mg/dL — ABNORMAL HIGH (ref 0.44–1.00)
GFR, Estimated: 50 mL/min — ABNORMAL LOW (ref >60)
Glucose, Bld: 104 mg/dL — ABNORMAL HIGH (ref 70–99)
Potassium: 4.2 mmol/L (ref 3.5–5.1)
Sodium: 144 mmol/L (ref 135–145)

## 2023-06-28 LAB — ECHOCARDIOGRAM COMPLETE
Calc EF: 58.8 %
MV M vel: 4.93 m/s
MV Peak grad: 97.2 mm[Hg]
S' Lateral: 3.4 cm
Single Plane A2C EF: 53.3 %
Single Plane A4C EF: 62.4 %

## 2023-06-28 LAB — BRAIN NATRIURETIC PEPTIDE: B Natriuretic Peptide: 289 pg/mL — ABNORMAL HIGH (ref 0.0–100.0)

## 2023-06-28 MED ORDER — FINERENONE 10 MG PO TABS: 10.0000 mg | ORAL_TABLET | Freq: Every day | ORAL | 11 refills | Status: DC

## 2023-06-28 MED ORDER — POTASSIUM CHLORIDE ER 10 MEQ PO TBCR: 20.0000 meq | EXTENDED_RELEASE_TABLET | Freq: Every day | ORAL | Status: DC

## 2023-06-28 NOTE — Progress Notes (Signed)
Medication Samples have been provided to the patient.  Drug name: Xarelto       Strength: 20 mg        Qty: 3  LOT: 23MG 470  Exp.Date: 05/2025  Dosing instructions:  Take 1 tablet daily  The patient has been instructed regarding the correct time, dose, and frequency of taking this medication, including desired effects and most common side effects.   [name] 12:24 PM 06/28/2023

## 2023-06-28 NOTE — Telephone Encounter (Signed)
She should be able to get with a urinary albumin measurement, correct Lauren?

## 2023-06-28 NOTE — Telephone Encounter (Signed)
Advanced Heart Failure Patient Advocate Encounter  Received notification that patient is to start Chauncey Mann (which requires PA). 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 Unfortunately, she would not be approved based off of this guideline.   Routing to provider to update.  Archer Asa, CPhT

## 2023-06-28 NOTE — Patient Instructions (Addendum)
DON'T SKIP PM DOSE OF LASIX.  START Finerenone 10 mg daily.  DECREASE Potassium to 20 mEq ( 1 Tab) daily AFTER YOU START Finerenone.  Labs done today, your results will be available in MyChart, we will contact you for abnormal readings.  Repeat blood work in 10 days  Your physician recommends that you schedule a follow-up appointment in: 3 months ( January 2025) ** PLEASE CALL THE OFFICE IN MID NOVEMBER TO ARRANGE YOUR FOLLOW UP APPOINTMENT. **  If you have any questions or concerns before your next appointment please send [place] a message through [place] or call our office at [phone].    TO LEAVE A MESSAGE FOR THE NURSE SELECT OPTION 2, PLEASE LEAVE A MESSAGE INCLUDING: YOUR NAME DATE OF BIRTH CALL BACK NUMBER REASON FOR CALL**this is important as we prioritize the call backs  YOU WILL RECEIVE A CALL BACK THE SAME DAY AS LONG AS YOU CALL BEFORE 4:00 PM  At the [facility], you and your health needs are our priority. As part of our continuing mission to provide you with exceptional heart care, we have created designated Provider Care Teams. These Care Teams include your primary Cardiologist (physician) and Advanced Practice Providers (APPs- Physician Assistants and Nurse Practitioners) who all work together to provide you with the care you need, when you need it.   You may see any of the following providers on your designated Care Team at your next follow up: Dr Arvilla Meres Dr Marca Ancona Dr. Dorthula Nettles Dr. Clearnce Hasten Amy Filbert Schilder, NP Robbie Lis, [place] [facility] [place], [place] Brynda Peon, NP [place] Lee, NP Karle Plumber, PharmD   Please be sure to bring in all your medications bottles to every appointment.    Thank you for choosing [facility]

## 2023-06-28 NOTE — Progress Notes (Signed)
Date:  06/28/2023   ID:  Stacy, [name] [birth year], MRN [id number]   Provider location: [facility] Type of Visit: Established patient   PCP:  Madelin Headings, MD  Cardiologist:  Dr. Shirlee Latch  HPI: [name] is a 72 y.o. 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   Admitted 3/2 ->11/17/16 with A/C diastolic CHF and A/C respiratory failure. Pt initially refused Bipap so venti mask used. Eventually tolerated transition to BiPAP. Overall pt diuresed from 285 lbs down to 229 lbs with 38 L of diuresis.  Echo 11/07/16 EF 55-60%, PASP 90mm Hg, mildly dilated RV with moderate to severely decreased RV systolic function. RHC/LHC in 3/18 showed no coronary disease and confirmed severe PH.    Echo in 7/20 showed EF 60-65%, mild LVH, normal RV, PASP 38 mmHg, mild AS. Echo in 8/21 showed EF 60-65%, mildly decreased RV function, severe biatrial enlargement, small PFO, unable to estimate PA systolic pressure.   Echo 1/23, EF 60-65%, mild LVH, normal RV, mild RV enlargement, severe biatrial enlargement, mild MR, no significant aortic stenosis.   Echo was done today and reviewed, EF 60-65%, normal RV, severe biatrial enlargement, mild MR, suspect PFO, PASP 27 mmHg.    Today she returns for HF follow up.  Weight down 11 lbs. Main complaint is periodic bloating and abdominal pain. This is thought to be due to her umbilical hernia.  She has right knee pain and will be undergoing surgery on her right knee in 12/24.  She uses a walker for stability.  No dyspnea walking around the house, gets short of breath after walking about 100 yards.  She uses CPAP at night, no longer requiring oxygen during the day. She often does not take her pm Lasix.   ECG (personally reviewed): atrial fibrillation w/ PVC   Labs (1/19): K  3.7, creatinine 1.37 Labs (11/19): K 3.9, creatinine 1.2, LDL 67, hgb 13.2 Labs (1/20): K 3.5, creatinine 1.34 Labs (5/20): K 3.9, creatinine 1.18, LDL 73 Labs (6/21): K 4.7, creatinine 1.19, LDL 71, hgb 13.7 Labs (11/21): K 4, creatinine 1.17, LDL 71 Labs (4/22): K 4.3, creatinine 1.34, LDL 59 Labs (9/22): K 3.9, creatinine 1.34, hgb 14.3 Labs (5/23): K 5.1, creatinine 1.28 Labs (9/24): K 3.6, HCO3 26, creatinine 1.37, LDL 63, TGs 100, TSH normal   PMH 1. 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 - RHC/LHC 11/07/16: No angiographic CAD; PA 90/55, LVEDP 23 (PCWP inaccurate), CI 2.15 Fick/2.32 Thermo, shunt run negative, PVR 5.7 WU.  - Echo (7/20): EF 60-65%, mild LVH, normal RV, PASP 38 mmHg, mild AS.  - Echo (8/21): EF 60-65%, mildly decreased RV function, severe biatrial enlargement, small PFO, unable to estimate PA systolic pressure. - Echo (1/23): EF 60-65%, mild LVH, normal RV, mild RV enlargement, severe biatrial enlargement, mild MR, no significant aortic stenosis.  - Echo (10/24): EF 60-65%, normal RV, severe biatrial enlargement, mild MR, suspect PFO, PASP 27 mmHg.  2. Chronic hypercarbic/hypoxic respiratory failure with OHS/OSA. Sees Dr. Kendrick Fries. Using nightly BiPAP and oxygen by nasal cannula during the day.  3. Atrial fibrillation: Chronic - Rate controlled on Xarelto + atenolol  4.  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  PVR 5.7 WU on RHC 3/18.  Suspect group 2 (elevated LA pressure) and group 3 (OHS/OSA) PH. However, cannot rule out group 1 component.  She had V/Q scan that was not suggestive of chronic PE and high resolution chest CT that was not suggestive of ILD.  ANA, RF, anti-SCL70 all negative.   5. H/o CVA 6. Hypothyroidism 7. H/o TKR 8. Aortic stenosis: Mild on 7/20  echo. No evidence for significant stenosis on 1/23 echo.   Current Outpatient Medications  Medication Sig Dispense Refill   acetaZOLAMIDE (DIAMOX) 250 MG tablet TAKE 1 TABLET (250 MG TOTAL) BY MOUTH DAILY. NEEDS FOLLOW UP APPOINTMENT FOR MORE REFILLS 90 tablet 1   ADCIRCA 20 MG tablet TAKE 2 TABLETS BY MOUTH 1 TIME A DAY. 180 tablet 3   allopurinol (ZYLOPRIM) 100 MG tablet TAKE 1 TABLET BY MOUTH TWICE A DAY 180 tablet 0   atenolol (TENORMIN) 50 MG tablet Take 1 tablet (50 mg total) by mouth daily. 90 tablet 3   Cholecalciferol (VITAMIN D) 125 MCG (5000 UT) CAPS Take 5,000 Units by mouth 3 (three) times a week.      diclofenac sodium (VOLTAREN) 1 % GEL Apply 2 g topically 2 (two) times daily as needed (for arthritis).     empagliflozin (JARDIANCE) 10 MG TABS tablet Take 1 tablet (10 mg total) by mouth daily. 30 tablet 11   Ferrous Sulfate (SLOW FE PO) Take 1 tablet by mouth 2 (two) times a week.     Finerenone 10 MG TABS Take 1 tablet (10 mg total) by mouth daily. 30 tablet 11   furosemide (LASIX) 20 MG tablet Take 40 mg by mouth daily. 20 mg in the PM     levothyroxine (SYNTHROID) 75 MCG tablet TAKE 1 TABLET BY MOUTH EVERY DAY 90 tablet 1   Methylcobalamin (B12-ACTIVE PO) Take 1 tablet by mouth 2 (two) times a week.     rivaroxaban (XARELTO) 20 MG TABS tablet TAKE 1 TABLET (20 MG TOTAL) BY MOUTH DAILY WITH SUPPER. NEEDS FOLLOW UP APPOINTMENT FOR MORE REFILLS 60 tablet 0   rosuvastatin (CRESTOR) 5 MG tablet Take 1 tablet (5 mg total) by mouth daily. 90 tablet 3   traMADol (ULTRAM) 50 MG tablet TAKE 1 TABLET BY MOUTH 3 TIMES DAILY AS NEEDED. 15 tablet 1   potassium chloride (KLOR-CON) 10 MEQ tablet Take 2 tablets (20 mEq total) by mouth daily.     No current facility-administered medications for this encounter.    Allergies:   Ambien [zolpidem tartrate], Losartan, Prevacid [lansoprazole], Adhesive [tape], Keflex [cephalexin], Motrin [ibuprofen], Prilosec [omeprazole], Ace inhibitors, Benadryl  [diphenhydramine hcl], and Spironolactone   Social History:  The patient  reports that she has never smoked. She has never used smokeless tobacco. She reports current alcohol use of about 1.0 standard drink of alcohol per week. She reports that she does not use drugs.   Family History:  The patient's family history includes COPD in her mother; Diabetes in her father; Esophageal cancer in her cousin; Heart attack in her father; Hypertension in her brother, father, mother, and sister; Liver cancer in her father; Osteoporosis in her mother; Stroke in her maternal grandmother.   ROS:  Please see the history of present illness.   All other systems are personally reviewed and negative.   Wt Readings from Last 3 Encounters:  06/28/23 104.4 kg (230 lb 3.2 oz)  06/07/23 104.4 kg (230 lb 4 oz)  05/30/23  103.9 kg (229 lb)   BP 120/70   Pulse (!) 53   Wt 104.4 kg (230 lb 3.2 oz)   LMP 05/06/2013   SpO2 96%   BMI 44.96 kg/m   Physical Exam:   General: NAD Neck: JVP 8 cm with HJR, no thyromegaly or thyroid nodule.  Lungs: Clear to auscultation bilaterally with normal respiratory effort. CV: Nondisplaced PMI.  Heart irregular S1/S2, no S3/S4, no murmur.  No peripheral edema.  No carotid bruit.  Normal pedal pulses.  Abdomen: Soft, nontender, no hepatosplenomegaly, no distention.  Skin: Intact without lesions or rashes.  Neurologic: Alert and oriented x 3.  Psych: Normal affect. Extremities: No clubbing or cyanosis.  HEENT: Normal.   Recent Labs: 05/30/2023: ALT 7; Hemoglobin 14.0; Platelets 126.0; TSH 1.05 06/28/2023: B Natriuretic Peptide 289.0; BUN 24; Creatinine, Ser 1.15; Potassium 4.2; Sodium 144  Personally reviewed   ASSESSMENT AND PLAN: 1. Chronic diastolic CHF:  Associated with severe pulmonary hypertension and prominent RV failure.  NYHA class II symptoms, Stable.  Echo today showed EF 60-65%, normal RV, severe biatrial enlargement, mild MR, suspect PFO, PASP 27 mmHg. I suspect she  has mild volume overload.  - She often misses her pm Lasix.  I would like her to take Lasix 40 qam/20 qpm regularly.  BMET/BNP today and BMET in 10 days.  - Continue Jardiance 10 mg daily.  - Start finerenone 10 mg daily and decrease KCl to 20 mEq daily.  2. Chronic hypercarbic/hypoxic respiratory failure with OHS/OSA:  Using CPAP at night. She is not using oxygen during the day at this point, has lost weight. 3. Atrial fibrillation: Chronic, rate controlled.  - Continue atenolol 50 mg daily.    - Continue Xarelto for anticoagulation.  4. 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  PVR 5.7 WU by RHC in 3/18.  Suspect group 2 (elevated LA pressure) and group 3 (OHS/OSA) PH. However, cannot rule out group 1 component.  She had V/Q scan that was not suggestive of chronic PE and high resolution chest CT that was not suggestive of ILD.  ANA, RF, anti-SCL70 all negative.  She did not have a marked improvement from taking Adcirca => suspect predominantly group 2 and 3 PH.  Will hold off on additional pulmonary vasodilators. Echo 1/23 showed with mildly enlarged RV with normal systolic function, unable to estimate PA systolic pressure. Echo today showed normal RV with PASP estimated 27 mmHg (improved).  - Continue Lasix as above.  BNP today.  - Continue Adcirca 40 mg daily.  - Continue CPAP at night for OSA, no longer requiring oxygen during the day.  5. HTN: BP controlled generally.  6. Obesity: Body mass index is 44.96 kg/m. - Insurance would not cover semaglutide. 7. Pre-operative risk evaluation: She will need umbilical hernia repair.  Echo today showed improved RV function and normal PA pressure estimate.  She no longer requires oxygen during the day.   I think she would be of acceptable risk for surgery from a cardiac standpoint.    Follow up in 3 months with APP.   Signed, Marca Ancona, MD  06/28/2023  [facility] [address] [facility] [place] [address] [phone] (office) [phone] (fax)

## 2023-06-29 NOTE — Telephone Encounter (Signed)
Unfortunately no, she can't get it without having both CKD (with UACR >30) and T2DM. Since she doesn't have diabetes, she does not qualify. If/when the new indication for HF with preserved ejection fraction is approved she will qualify then. That will likely not be until at least July 2025.

## 2023-07-07 ENCOUNTER — Telehealth (HOSPITAL_COMMUNITY): Payer: Self-pay | Admitting: Pharmacy Technician

## 2023-07-07 NOTE — Telephone Encounter (Signed)
[facility] Patient Advocate Encounter  Sent in patient assistance renewal application to [facility] for Emma assistance. Document scanned to chart.   Will follow up.

## 2023-07-10 ENCOUNTER — Other Ambulatory Visit (HOSPITAL_COMMUNITY): Payer: PPO

## 2023-07-12 DIAGNOSIS — M25361 Other instability, right knee: Secondary | ICD-10-CM | POA: Diagnosis not present
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
## 2023-07-13 ENCOUNTER — Telehealth (HOSPITAL_COMMUNITY): Payer: Self-pay | Admitting: Cardiology

## 2023-07-13 NOTE — Telephone Encounter (Signed)
SURGICAL CLEARANCE RECEIVED PER BETHANY PA -knee revision scheduled 08/07/23 -ortho recommends xarelto 3 day hold prior to procedure  Please advise

## 2023-07-13 NOTE — Telephone Encounter (Signed)
My last note gives clearance, ok to hold Xarelto.

## 2023-07-14 DIAGNOSIS — H01002 Unspecified blepharitis right lower eyelid: Secondary | ICD-10-CM | POA: Diagnosis not present

## 2023-07-14 DIAGNOSIS — Z961 Presence of intraocular lens: Secondary | ICD-10-CM | POA: Diagnosis not present

## 2023-07-14 DIAGNOSIS — H01005 Unspecified blepharitis left lower eyelid: Secondary | ICD-10-CM | POA: Diagnosis not present

## 2023-07-14 DIAGNOSIS — H26493 Other secondary cataract, bilateral: Secondary | ICD-10-CM | POA: Diagnosis not present

## 2023-07-17 ENCOUNTER — Telehealth (HOSPITAL_COMMUNITY): Payer: Self-pay

## 2023-07-17 ENCOUNTER — Other Ambulatory Visit (HOSPITAL_COMMUNITY): Payer: Self-pay | Admitting: Cardiology

## 2023-07-17 NOTE — Telephone Encounter (Signed)
Medication Samples have been left at registration desk for patient pick up. Drug name: Xarelto 20 MG Qty: 8x 7 ct packages LOT: [id number] Exp.: 09/2025 SIG: Take 1 tablet by mouth once daily   The patient has been instructed regarding the correct time, dose, and frequency of taking this medication, including desired effects and most common side effects.   Burnell Blanks, CPhT Rx Patient Advocate Phone: [phone]

## 2023-07-17 NOTE — Telephone Encounter (Signed)
Message left for [facility]

## 2023-07-18 ENCOUNTER — Ambulatory Visit: Payer: PPO | Admitting: Physician Assistant

## 2023-07-19 NOTE — Telephone Encounter (Signed)
Advanced Heart Failure Patient Advocate Encounter   Patient was approved to receive Jardiance from [employer]  Effective dates: 07/16/23 through 09/04/24  Representative stated that the patient should receive the medication in the next 10 business days. Called and spoke with the patient.   Archer Asa, CPhT

## 2023-07-25 DIAGNOSIS — G4733 Obstructive sleep apnea (adult) (pediatric): Secondary | ICD-10-CM | POA: Diagnosis not present

## 2023-07-25 DIAGNOSIS — J9610 Chronic respiratory failure, unspecified whether with hypoxia or hypercapnia: Secondary | ICD-10-CM | POA: Diagnosis not present

## 2023-07-25 NOTE — Progress Notes (Signed)
COVID Vaccine received:  []  No [x]  Yes Date of any COVID positive Test in last 90 days:  PCP -  Berniece Andreas, MD Cardiologist - Marca Ancona, MD Pulmonology- Dr. Melida Quitter / Parrett NP  clearance  Chest x-ray - 03-07-2023  2v  Epic EKG - 06-28-2023  Epic  Stress Test -  ECHO - 06-28-2023  Epic Cardiac Cath - 11-07-2016  R/LHC by Dr. Bryan Lemma  PCR screen: []  Ordered & Completed []   No Order but Needs PROFEND     []   N/A for this surgery  Surgery Plan:  []  Ambulatory   []  Outpatient in bed  []  Admit Anesthesia:    []  General  []  Spinal  []   Choice []   MAC  Pacemaker / ICD device [x]  No []  Yes   Spinal Cord Stimulator:[x]  No []  Yes       History of Sleep Apnea? []  No [x]  Yes   CPAP used?- []  No [x]  Yes    Does the patient monitor blood sugar?   []  N/A   []  No []  Yes  Patient has: []  NO Hx DM   [x]  Pre-DM   []  DM1  []   DM2 Last A1c was: 5.8  on 05-30-2023       Blood Thinner / Instructions:Xarelto   Hold x  Aspirin Instructions:  none  ERAS Protocol Ordered: []  No  [x]  Yes PRE-SURGERY []  ENSURE  [x]  G2   Patient is to be NPO after: 12:00 noon  Dental hx: []  Dentures:  []  N/A      []  Bridge or Partial:                   []  Loose or Damaged teeth:   Comments: Patient was given the 5 CHG shower / bath instructions for TKA  surgery along with 2 bottles of the CHG soap. Patient will start this on: 08-03-2023    All questions were asked and answered, Patient voiced understanding of this process.   Activity level: Patient is able / unable to climb a flight of stairs without difficulty; []  No CP  []  No SOB, but would have ___   Patient can / can not perform ADLs without assistance.   Anesthesia review: OSA-CPAP, Hx CVA, Pre-DM, anemia, Pulm HTN, CHF, A.fib,   Patient denies shortness of breath, fever, cough and chest pain at PAT appointment.  Patient verbalized understanding and agreement to the Pre-Surgical Instructions that were given to them at this PAT appointment. Patient was  also educated of the need to review these PAT instructions again prior to her surgery.I reviewed the appropriate phone numbers to call if they have any and questions or concerns.

## 2023-07-25 NOTE — Patient Instructions (Signed)
SURGICAL WAITING ROOM VISITATION Patients having surgery or a procedure may have no more than 2 support people in the waiting area - these visitors may rotate in the visitor waiting room.   Due to an increase in RSV and influenza rates and associated hospitalizations, children ages 18 and under may not visit patients in [facility] hospitals. If the patient needs to stay at the hospital during part of their recovery, the visitor guidelines for inpatient rooms apply.  PRE-OP VISITATION  Pre-op nurse will coordinate an appropriate time for 1 support person to accompany the patient in pre-op.  This support person may not rotate.  This visitor will be contacted when the time is appropriate for the visitor to come back in the pre-op area.  Please refer to the [facility] website for the visitor guidelines for Inpatients (after your surgery is over and you are in a regular room).  You are not required to quarantine at this time prior to your surgery. However, you must do this: Hand Hygiene often Do NOT share personal items Notify your provider if you are in close contact with someone who has COVID or you develop fever 100.4 or greater, new onset of sneezing, cough, sore throat, shortness of breath or body aches.  If you test positive for Covid or have been in contact with anyone that has tested positive in the last 10 days please notify you surgeon.    Your procedure is scheduled on:  Monday   August 07, 2023  Report to [facility] Main Entrance: [facility] entrance where the [employer] is available.   Report to admitting at: 12:30  PM  Call this number if you have any questions or problems the morning of surgery [phone]  Do not eat food after Midnight the night prior to your surgery/procedure.  After Midnight you may have the following liquids until   12:00 noon the DAY OF SURGERY  Clear Liquid Diet Water Black Coffee (sugar ok, NO MILK/CREAM OR CREAMERS)  Tea (sugar ok,  NO MILK/CREAM OR CREAMERS) regular and decaf                             Plain Jell-O  with no fruit (NO RED)                                           Fruit ices (not with fruit pulp, NO RED)                                     Popsicles (NO RED)                                                                  Juice: NO CITRUS JUICES: only apple, WHITE grape, WHITE cranberry Sports drinks like Gatorade or Powerade (NO RED)                   The day of surgery:  Drink ONE (1) Pre-Surgery G2 at  12:00  Noon the day of  surgery. Drink in one sitting. Do not sip.  This drink was given to you during your hospital pre-op appointment visit. Nothing else to drink after completing the Pre-Surgery G2 : No candy, chewing gum or throat lozenges.    FOLLOW ANY ADDITIONAL PRE OP INSTRUCTIONS YOU RECEIVED FROM YOUR SURGEON'S OFFICE!!!   Oral Hygiene is also important to reduce your risk of infection.        Remember - BRUSH YOUR TEETH THE MORNING OF SURGERY WITH YOUR REGULAR TOOTHPASTE  Do NOT smoke after Midnight the night before surgery.  STOP TAKING all Vitamins, Herbs and supplements 1 week before your surgery.   Take ONLY these medicines the morning of surgery with A SIP OF WATER: Levothyroxine, allopurinol, atenolol,.  You may take either Tylenol or Tramadol if needed for pain.  You may use your Eye drops if needed.    If You have been diagnosed with Sleep Apnea - Bring CPAP mask and tubing day of surgery. We will provide you with a CPAP machine on the day of your surgery.                   You may not have any metal on your body including hair pins, jewelry, and body piercing  Do not wear make-up, lotions, powders, perfumes or deodorant  Do not wear nail polish including gel and S&S, artificial / acrylic nails, or any other type of covering on natural nails including finger and toenails. If you have artificial nails, gel coating, etc., that needs to be removed by a nail salon, Please have  this removed prior to surgery. Not doing so may mean that your surgery could be cancelled or delayed if the Surgeon or anesthesia staff feels like they are unable to monitor you safely.   Do not shave 48 hours prior to surgery to avoid nicks in your skin which may contribute to postoperative infections.   Contacts, Hearing Aids, dentures or bridgework may not be worn into surgery. DENTURES WILL BE REMOVED PRIOR TO SURGERY PLEASE DO NOT APPLY "Poly grip" OR ADHESIVES!!!  You may bring a small overnight bag with you on the day of surgery, only pack items that are not valuable. [facility] IS NOT RESPONSIBLE   FOR VALUABLES THAT ARE LOST OR STOLEN.   Do not bring your home medications to the hospital. The Pharmacy will dispense medications listed on your medication list to you during your admission in the Hospital.  Special Instructions: Bring a copy of your healthcare power of attorney and living will documents the day of surgery, if you wish to have them scanned into your [facility] Records- EPIC  Please read over the following fact sheets you were given: IF YOU HAVE QUESTIONS ABOUT YOUR PRE-OP INSTRUCTIONS, PLEASE CALL [phone].     Pre-operative 5 CHG Bath Instructions   You can play a key role in reducing the risk of infection after surgery. Your skin needs to be as free of germs as possible. You can reduce the number of germs on your skin by washing with CHG (chlorhexidine gluconate) soap before surgery. CHG is an antiseptic soap that kills germs and continues to kill germs even after washing.   DO NOT use if you have an allergy to chlorhexidine/CHG or antibacterial soaps. If your skin becomes reddened or irritated, stop using the CHG and notify one of our RNs at [phone]  Please shower with the CHG soap starting 4 days before surgery using the following schedule: START SHOWERS  ON Thursday   August 03, 2023                                                                                                                                                                                       Please keep in mind the following:  DO NOT shave, including legs and underarms, starting the day of your first shower.   You may shave your face at any point before/day of surgery.   Place clean sheets on your bed the day you start using CHG soap. Use a clean washcloth (not used since being washed) for each shower. DO NOT sleep with pets once you start using the CHG.   CHG Shower Instructions:  If you choose to wash your hair and private area, wash first with your normal shampoo/soap.  After you use shampoo/soap, rinse your hair and body thoroughly to remove shampoo/soap residue.  Turn the water OFF and apply about 3 tablespoons (45 ml) of CHG soap to a CLEAN washcloth.  Apply CHG soap ONLY FROM YOUR NECK DOWN TO YOUR TOES (washing for 3-5 minutes)  DO NOT use CHG soap on face, private areas, open wounds, or sores.  Pay special attention to the area where your surgery is being performed.  If you are having back surgery, having someone wash your back for you may be helpful.  Wait 2 minutes after CHG soap is applied, then you may rinse off the CHG soap.  Pat dry with a clean towel  Put on clean clothes/pajamas   If you choose to wear lotion, please use ONLY the CHG-compatible lotions on the back of this paper.     Additional instructions for the day of surgery: DO NOT APPLY any lotions, deodorants, cologne, or perfumes.   Put on clean/comfortable clothes.  Brush your teeth.  Ask your nurse before applying any prescription medications to the skin.      CHG Compatible Lotions   Aveeno Moisturizing lotion  Cetaphil Moisturizing Cream  Cetaphil Moisturizing Lotion  Clairol Herbal Essence Moisturizing Lotion, Dry Skin  Clairol Herbal Essence Moisturizing Lotion, Extra Dry Skin  Clairol Herbal Essence Moisturizing Lotion, Normal Skin  Curel Age Defying Therapeutic  Moisturizing Lotion with Alpha Hydroxy  Curel Extreme Care Body Lotion  Curel Soothing Hands Moisturizing Hand Lotion  Curel Therapeutic Moisturizing Cream, Fragrance-Free  Curel Therapeutic Moisturizing Lotion, Fragrance-Free  Curel Therapeutic Moisturizing Lotion, Original Formula  Eucerin Daily Replenishing Lotion  Eucerin Dry Skin Therapy Plus Alpha Hydroxy Crme  Eucerin Dry Skin Therapy Plus Alpha Hydroxy Lotion  Eucerin Original Crme  Eucerin Original Lotion  Eucerin Plus Crme Eucerin Plus Lotion  Eucerin TriLipid Replenishing Lotion  Keri Anti-Bacterial Hand Lotion  Keri Deep Conditioning Original Lotion Dry Skin Formula Softly Scented  Keri Deep Conditioning Original Lotion, Fragrance Free Sensitive Skin Formula  Keri Lotion Fast Absorbing Fragrance Free Sensitive Skin Formula  Keri Lotion Fast Absorbing Softly Scented Dry Skin Formula  Keri Original Lotion  Keri Skin Renewal Lotion Keri Silky Smooth Lotion  Keri Silky Smooth Sensitive Skin Lotion  Nivea Body Creamy Conditioning Oil  Nivea Body Extra Enriched Lotion  Nivea Body Original Lotion  Nivea Body Sheer Moisturizing Lotion Nivea Crme  Nivea Skin Firming Lotion  NutraDerm 30 Skin Lotion  NutraDerm Skin Lotion  NutraDerm Therapeutic Skin Cream  NutraDerm Therapeutic Skin Lotion  ProShield Protective Hand Cream  Provon moisturizing lotion   FAILURE TO FOLLOW THESE INSTRUCTIONS MAY RESULT IN THE CANCELLATION OF YOUR SURGERY  PATIENT SIGNATURE_________________________________  NURSE SIGNATURE__________________________________  ________________________________________________________________________     [name]    An incentive spirometer is a tool that can help keep your lungs clear and active. This tool measures how well you are filling your lungs with each breath. Taking long deep breaths may help reverse or decrease the chance of developing breathing (pulmonary) problems (especially  infection) following: A long period of time when you are unable to move or be active. BEFORE THE PROCEDURE  If the spirometer includes an indicator to show your best effort, your nurse or respiratory therapist will set it to a desired goal. If possible, sit up straight or lean slightly forward. Try not to slouch. Hold the incentive spirometer in an upright position. INSTRUCTIONS FOR USE  Sit on the edge of your bed if possible, or sit up as far as you can in bed or on a chair. Hold the incentive spirometer in an upright position. Breathe out normally. Place the mouthpiece in your mouth and seal your lips tightly around it. Breathe in slowly and as deeply as possible, raising the piston or the ball toward the top of the column. Hold your breath for 3-5 seconds or for as long as possible. Allow the piston or ball to fall to the bottom of the column. Remove the mouthpiece from your mouth and breathe out normally. Rest for a few seconds and repeat Steps 1 through 7 at least 10 times every 1-2 hours when you are awake. Take your time and take a few normal breaths between deep breaths. The spirometer may include an indicator to show your best effort. Use the indicator as a goal to work toward during each repetition. After each set of 10 deep breaths, practice coughing to be sure your lungs are clear. If you have an incision (the cut made at the time of surgery), support your incision when coughing by placing a pillow or rolled up towels firmly against it. Once you are able to get out of bed, walk around indoors and cough well. You may stop using the incentive spirometer when instructed by your caregiver.  RISKS AND COMPLICATIONS Take your time so you do not get dizzy or light-headed. If you are in pain, you may need to take or ask for pain medication before doing incentive spirometry. It is harder to take a deep breath if you are having pain. AFTER USE Rest and breathe slowly and easily. It can be  helpful to keep track of a log of your progress. Your caregiver can provide you with a simple table to help with this. If you are using the spirometer at home, follow these instructions: SEEK MEDICAL CARE IF:  You  are having difficultly using the spirometer. You have trouble using the spirometer as often as instructed. Your pain medication is not giving enough relief while using the spirometer. You develop fever of 100.5 F (38.1 C) or higher.                                                                                                    SEEK IMMEDIATE MEDICAL CARE IF:  You cough up bloody sputum that had not been present before. You develop fever of 102 F (38.9 C) or greater. You develop worsening pain at or near the incision site. MAKE SURE YOU:  Understand these instructions. Will watch your condition. Will get help right away if you are not doing well or get worse. Document Released: 01/02/2007 Document Revised: 11/14/2011 Document Reviewed: 03/05/2007 [facility] Patient Information 2014 [place], [place].     WHAT IS A BLOOD TRANSFUSION? Blood Transfusion Information  A transfusion is the replacement of blood or some of its parts. Blood is made up of multiple cells which provide different functions. Red blood cells carry oxygen and are used for blood loss replacement. White blood cells fight against infection. Platelets control bleeding. Plasma helps clot blood. Other blood products are available for specialized needs, such as hemophilia or other clotting disorders. BEFORE THE TRANSFUSION  Who gives blood for transfusions?  Healthy volunteers who are fully evaluated to make sure their blood is safe. This is blood bank blood. Transfusion therapy is the safest it has ever been in the practice of medicine. Before blood is taken from a donor, a complete history is taken to make sure that person has no history of diseases nor engages in risky social behavior (examples are  intravenous drug use or sexual activity with multiple partners). The donor's travel history is screened to minimize risk of transmitting infections, such as malaria. The donated blood is tested for signs of infectious diseases, such as HIV and hepatitis. The blood is then tested to be sure it is compatible with you in order to minimize the chance of a transfusion reaction. If you or a relative donates blood, this is often done in anticipation of surgery and is not appropriate for emergency situations. It takes many days to process the donated blood. RISKS AND COMPLICATIONS Although transfusion therapy is very safe and saves many lives, the main dangers of transfusion include:  Getting an infectious disease. Developing a transfusion reaction. This is an allergic reaction to something in the blood you were given. Every precaution is taken to prevent this. The decision to have a blood transfusion has been considered carefully by your caregiver before blood is given. Blood is not given unless the benefits outweigh the risks. AFTER THE TRANSFUSION Right after receiving a blood transfusion, you will usually feel much better and more energetic. This is especially true if your red blood cells have gotten low (anemic). The transfusion raises the level of the red blood cells which carry oxygen, and this usually causes an energy increase. The nurse administering the transfusion will monitor you carefully for complications. HOME CARE INSTRUCTIONS  No special  instructions are needed after a transfusion. You may find your energy is better. Speak with your caregiver about any limitations on activity for underlying diseases you may have. SEEK MEDICAL CARE IF:  Your condition is not improving after your transfusion. You develop redness or irritation at the intravenous (IV) site. SEEK IMMEDIATE MEDICAL CARE IF:  Any of the following symptoms occur over the next 12 hours: Shaking chills. You have a temperature by  mouth above 102 F (38.9 C), not controlled by medicine. Chest, back, or muscle pain. People around you feel you are not acting correctly or are confused. Shortness of breath or difficulty breathing. Dizziness and fainting. You get a rash or develop hives. You have a decrease in urine output. Your urine turns a dark color or changes to pink, red, or brown. Any of the following symptoms occur over the next 10 days: You have a temperature by mouth above 102 F (38.9 C), not controlled by medicine. Shortness of breath. Weakness after normal activity. The white part of the eye turns yellow (jaundice). You have a decrease in the amount of urine or are urinating less often. Your urine turns a dark color or changes to pink, red, or brown. Document Released: 08/19/2000 Document Revised: 11/14/2011 Document Reviewed: 04/07/2008 [facility] Patient Information 2014 [place], [place].  _______________________________________________________________________

## 2023-07-26 ENCOUNTER — Encounter (HOSPITAL_COMMUNITY)
Admission: RE | Admit: 2023-07-26 | Discharge: 2023-07-26 | Disposition: A | Discharge status: Home / Self Care | Payer: PPO | Source: Ambulatory Visit | Attending: Orthopedic Surgery | Admitting: Orthopedic Surgery

## 2023-07-26 ENCOUNTER — Other Ambulatory Visit: Payer: Self-pay

## 2023-07-26 ENCOUNTER — Encounter (HOSPITAL_COMMUNITY): Payer: Self-pay

## 2023-07-26 VITALS — BP 120/54 | HR 60 | Temp 98.3°F | Resp 20 | Ht 60.0 in | Wt 230.0 lb

## 2023-07-26 DIAGNOSIS — Z01818 Encounter for other preprocedural examination: Secondary | ICD-10-CM

## 2023-07-26 DIAGNOSIS — I5032 Chronic diastolic (congestive) heart failure: Secondary | ICD-10-CM | POA: Diagnosis not present

## 2023-07-26 DIAGNOSIS — I110 Hypertensive heart disease with heart failure: Secondary | ICD-10-CM | POA: Insufficient documentation

## 2023-07-26 DIAGNOSIS — Z01812 Encounter for preprocedural laboratory examination: Secondary | ICD-10-CM | POA: Insufficient documentation

## 2023-07-26 HISTORY — DX: Malignant (primary) neoplasm, unspecified: C80.1

## 2023-07-26 LAB — BASIC METABOLIC PANEL
Anion gap: 8 (ref 5–15)
BUN: 26 mg/dL — ABNORMAL HIGH (ref 8–23)
CO2: 22 mmol/L (ref 22–32)
Calcium: 9.2 mg/dL (ref 8.9–10.3)
Chloride: 110 mmol/L (ref 98–111)
Creatinine, Ser: 1.15 mg/dL — ABNORMAL HIGH (ref 0.44–1.00)
GFR, Estimated: 50 mL/min — ABNORMAL LOW (ref >60)
Glucose, Bld: 96 mg/dL (ref 70–99)
Potassium: 3.9 mmol/L (ref 3.5–5.1)
Sodium: 140 mmol/L (ref 135–145)

## 2023-07-26 LAB — CBC
HCT: 45.7 % (ref 36.0–46.0)
Hemoglobin: 14.4 g/dL (ref 12.0–15.0)
MCH: 33.1 pg (ref 26.0–34.0)
MCHC: 31.5 g/dL (ref 30.0–36.0)
MCV: 105.1 fL — ABNORMAL HIGH (ref 80.0–100.0)
Platelets: 109 10*3/uL — ABNORMAL LOW (ref 150–400)
RBC: 4.35 MIL/uL (ref 3.87–5.11)
RDW: 14.4 % (ref 11.5–15.5)
WBC: 5.9 10*3/uL (ref 4.0–10.5)
nRBC: 0 % (ref 0.0–0.2)

## 2023-07-26 LAB — TYPE AND SCREEN
ABO/RH(D): O POS
Antibody Screen: NEGATIVE

## 2023-07-26 LAB — SURGICAL PCR SCREEN
MRSA, PCR: NEGATIVE
Staphylococcus aureus: NEGATIVE

## 2023-07-27 NOTE — Anesthesia Preprocedure Evaluation (Addendum)
Anesthesia Evaluation  Patient identified by MRN, date of birth, ID band Patient awake    Reviewed: Allergy & Precautions, H&P , NPO status , Patient's Chart, lab work & pertinent test results  Airway Mallampati: II       Dental   Pulmonary shortness of breath, sleep apnea    breath sounds clear to auscultation       Cardiovascular hypertension, + CAD and +CHF  + dysrhythmias Atrial Fibrillation  Rhythm:regular Rate:Normal     Neuro/Psych  Neuromuscular disease CVA    GI/Hepatic   Endo/Other  Hypothyroidism  Class 4 obesity  Renal/GU      Musculoskeletal  (+) Arthritis ,    Abdominal   Peds  Hematology   Anesthesia Other Findings   Reproductive/Obstetrics                             Anesthesia Physical Anesthesia Plan  ASA: 3  Anesthesia Plan: Spinal and MAC   Post-op Pain Management: Regional block*   Induction: Intravenous  PONV Risk Score and Plan: 2 and Propofol infusion and Treatment may vary due to age or medical condition  Airway Management Planned: Simple Face Mask  Additional Equipment:   Intra-op Plan:   Post-operative Plan:   Informed Consent: I have reviewed the patients History and Physical, chart, labs and discussed the procedure including the risks, benefits and alternatives for the proposed anesthesia with the patient or authorized representative who has indicated his/her understanding and acceptance.     Dental advisory given  Plan Discussed with: CRNA, Anesthesiologist and Surgeon  Anesthesia Plan Comments: (See PAT note 07/26/2023)       Anesthesia Quick Evaluation
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
## 2023-07-28 DIAGNOSIS — G4733 Obstructive sleep apnea (adult) (pediatric): Secondary | ICD-10-CM | POA: Diagnosis not present

## 2023-07-28 DIAGNOSIS — J9610 Chronic respiratory failure, unspecified whether with hypoxia or hypercapnia: Secondary | ICD-10-CM | POA: Diagnosis not present

## 2023-08-07 ENCOUNTER — Inpatient Hospital Stay (HOSPITAL_COMMUNITY)
Admission: RE | Admit: 2023-08-07 | Discharge: 2023-08-08 | DRG: 488 | Disposition: A | Discharge status: Home / Self Care | Payer: PPO | Source: Ambulatory Visit | Attending: Orthopedic Surgery | Admitting: Orthopedic Surgery

## 2023-08-07 ENCOUNTER — Other Ambulatory Visit: Payer: Self-pay

## 2023-08-07 ENCOUNTER — Inpatient Hospital Stay (HOSPITAL_COMMUNITY): Payer: PPO | Admitting: Physician Assistant

## 2023-08-07 ENCOUNTER — Encounter (HOSPITAL_COMMUNITY): Payer: Self-pay | Admitting: Orthopedic Surgery

## 2023-08-07 ENCOUNTER — Encounter (HOSPITAL_COMMUNITY)
Admission: RE | Disposition: A | Discharge status: Home / Self Care | Payer: Self-pay | Source: Ambulatory Visit | Attending: Orthopedic Surgery

## 2023-08-07 ENCOUNTER — Inpatient Hospital Stay (HOSPITAL_COMMUNITY): Payer: PPO | Admitting: Anesthesiology

## 2023-08-07 DIAGNOSIS — Q782 Osteopetrosis: Secondary | ICD-10-CM | POA: Diagnosis not present

## 2023-08-07 DIAGNOSIS — G629 Polyneuropathy, unspecified: Secondary | ICD-10-CM | POA: Diagnosis present

## 2023-08-07 DIAGNOSIS — I700 Atherosclerosis of aorta: Secondary | ICD-10-CM | POA: Diagnosis not present

## 2023-08-07 DIAGNOSIS — T84092A Other mechanical complication of internal right knee prosthesis, initial encounter: Secondary | ICD-10-CM | POA: Diagnosis not present

## 2023-08-07 DIAGNOSIS — E785 Hyperlipidemia, unspecified: Secondary | ICD-10-CM | POA: Diagnosis not present

## 2023-08-07 DIAGNOSIS — Z79890 Hormone replacement therapy (postmenopausal): Secondary | ICD-10-CM | POA: Diagnosis not present

## 2023-08-07 DIAGNOSIS — Z800 Family history of malignant neoplasm of digestive organs: Secondary | ICD-10-CM

## 2023-08-07 DIAGNOSIS — Z96612 Presence of left artificial shoulder joint: Secondary | ICD-10-CM | POA: Diagnosis not present

## 2023-08-07 DIAGNOSIS — Z7901 Long term (current) use of anticoagulants: Secondary | ICD-10-CM | POA: Diagnosis not present

## 2023-08-07 DIAGNOSIS — M51369 Other intervertebral disc degeneration, lumbar region without mention of lumbar back pain or lower extremity pain: Secondary | ICD-10-CM | POA: Diagnosis not present

## 2023-08-07 DIAGNOSIS — Z8673 Personal history of transient ischemic attack (TIA), and cerebral infarction without residual deficits: Secondary | ICD-10-CM | POA: Diagnosis not present

## 2023-08-07 DIAGNOSIS — Y792 Prosthetic and other implants, materials and accessory orthopedic devices associated with adverse incidents: Secondary | ICD-10-CM | POA: Diagnosis present

## 2023-08-07 DIAGNOSIS — Z6841 Body Mass Index (BMI) 40.0 and over, adult: Secondary | ICD-10-CM

## 2023-08-07 DIAGNOSIS — Z79899 Other long term (current) drug therapy: Secondary | ICD-10-CM | POA: Diagnosis not present

## 2023-08-07 DIAGNOSIS — Z823 Family history of stroke: Secondary | ICD-10-CM

## 2023-08-07 DIAGNOSIS — G8918 Other acute postprocedural pain: Secondary | ICD-10-CM | POA: Diagnosis not present

## 2023-08-07 DIAGNOSIS — E039 Hypothyroidism, unspecified: Secondary | ICD-10-CM | POA: Diagnosis present

## 2023-08-07 DIAGNOSIS — T84062A Wear of articular bearing surface of internal prosthetic right knee joint, initial encounter: Secondary | ICD-10-CM | POA: Diagnosis not present

## 2023-08-07 DIAGNOSIS — I482 Chronic atrial fibrillation, unspecified: Secondary | ICD-10-CM | POA: Diagnosis present

## 2023-08-07 DIAGNOSIS — I2510 Atherosclerotic heart disease of native coronary artery without angina pectoris: Secondary | ICD-10-CM | POA: Diagnosis not present

## 2023-08-07 DIAGNOSIS — E662 Morbid (severe) obesity with alveolar hypoventilation: Secondary | ICD-10-CM | POA: Diagnosis present

## 2023-08-07 DIAGNOSIS — Z881 Allergy status to other antibiotic agents status: Secondary | ICD-10-CM | POA: Diagnosis not present

## 2023-08-07 DIAGNOSIS — Z96611 Presence of right artificial shoulder joint: Secondary | ICD-10-CM | POA: Diagnosis not present

## 2023-08-07 DIAGNOSIS — T84093A Other mechanical complication of internal left knee prosthesis, initial encounter: Secondary | ICD-10-CM | POA: Diagnosis not present

## 2023-08-07 DIAGNOSIS — Z8249 Family history of ischemic heart disease and other diseases of the circulatory system: Secondary | ICD-10-CM

## 2023-08-07 DIAGNOSIS — J9611 Chronic respiratory failure with hypoxia: Secondary | ICD-10-CM | POA: Diagnosis present

## 2023-08-07 DIAGNOSIS — Z888 Allergy status to other drugs, medicaments and biological substances status: Secondary | ICD-10-CM | POA: Diagnosis not present

## 2023-08-07 DIAGNOSIS — Z5986 Financial insecurity: Secondary | ICD-10-CM

## 2023-08-07 DIAGNOSIS — Z825 Family history of asthma and other chronic lower respiratory diseases: Secondary | ICD-10-CM

## 2023-08-07 DIAGNOSIS — M1990 Unspecified osteoarthritis, unspecified site: Secondary | ICD-10-CM | POA: Diagnosis not present

## 2023-08-07 DIAGNOSIS — I110 Hypertensive heart disease with heart failure: Secondary | ICD-10-CM | POA: Diagnosis present

## 2023-08-07 DIAGNOSIS — Z8262 Family history of osteoporosis: Secondary | ICD-10-CM

## 2023-08-07 DIAGNOSIS — I4891 Unspecified atrial fibrillation: Secondary | ICD-10-CM | POA: Diagnosis not present

## 2023-08-07 DIAGNOSIS — I272 Pulmonary hypertension, unspecified: Secondary | ICD-10-CM | POA: Diagnosis present

## 2023-08-07 DIAGNOSIS — Z833 Family history of diabetes mellitus: Secondary | ICD-10-CM

## 2023-08-07 DIAGNOSIS — I5032 Chronic diastolic (congestive) heart failure: Secondary | ICD-10-CM | POA: Diagnosis not present

## 2023-08-07 DIAGNOSIS — Z96659 Presence of unspecified artificial knee joint: Principal | ICD-10-CM

## 2023-08-07 DIAGNOSIS — Z7984 Long term (current) use of oral hypoglycemic drugs: Secondary | ICD-10-CM

## 2023-08-07 DIAGNOSIS — Z96651 Presence of right artificial knee joint: Secondary | ICD-10-CM | POA: Diagnosis not present

## 2023-08-07 HISTORY — PX: TOTAL KNEE REVISION: SHX996

## 2023-08-07 SURGERY — TOTAL KNEE REVISION
Anesthesia: Monitor Anesthesia Care | Site: Knee | Laterality: Right

## 2023-08-07 MED ORDER — METHOCARBAMOL 500 MG PO TABS
500.0000 mg | ORAL_TABLET | Freq: Four times a day (QID) | ORAL | Status: DC | PRN
  Administered 2023-08-07: 500 mg via ORAL
  Filled 2023-08-07: qty 1

## 2023-08-07 MED ORDER — TRANEXAMIC ACID 1000 MG/10ML IV SOLN
2000.0000 mg | INTRAVENOUS | Status: AC
  Filled 2023-08-07: qty 20

## 2023-08-07 MED ORDER — DEXAMETHASONE SODIUM PHOSPHATE 10 MG/ML IJ SOLN
8.0000 mg | Freq: Once | INTRAMUSCULAR | Status: AC
  Administered 2023-08-07: 8 mg via INTRAVENOUS

## 2023-08-07 MED ORDER — SODIUM CHLORIDE 0.9 % IV SOLN: INTRAVENOUS | Status: DC

## 2023-08-07 MED ORDER — BUPIVACAINE IN DEXTROSE 0.75-8.25 % IT SOLN
INTRATHECAL | Status: DC | PRN
Start: 2023-08-07 — End: 2023-08-07
  Administered 2023-08-07: 1.5 mL via INTRATHECAL

## 2023-08-07 MED ORDER — SODIUM CHLORIDE 0.9 % IR SOLN
Status: DC | PRN
  Administered 2023-08-07: 1000 mL

## 2023-08-07 MED ORDER — BUPIVACAINE LIPOSOME 1.3 % IJ SUSP: 20.0000 mL | Freq: Once | INTRAMUSCULAR | Status: DC

## 2023-08-07 MED ORDER — PROPOFOL 10 MG/ML IV BOLUS
INTRAVENOUS | Status: DC | PRN
  Administered 2023-08-07 (×2): 20 mg via INTRAVENOUS

## 2023-08-07 MED ORDER — LACTATED RINGERS IV SOLN: INTRAVENOUS | Status: DC | PRN

## 2023-08-07 MED ORDER — POLYETHYLENE GLYCOL 3350 17 G PO PACK: 17.0000 g | PACK | Freq: Every day | ORAL | Status: DC | PRN

## 2023-08-07 MED ORDER — ACETAZOLAMIDE 250 MG PO TABS
250.0000 mg | ORAL_TABLET | Freq: Every day | ORAL | Status: DC
  Administered 2023-08-08: 250 mg via ORAL
  Filled 2023-08-07: qty 1

## 2023-08-07 MED ORDER — METOCLOPRAMIDE HCL 5 MG PO TABS: 5.0000 mg | ORAL_TABLET | Freq: Three times a day (TID) | ORAL | Status: DC | PRN

## 2023-08-07 MED ORDER — FUROSEMIDE 20 MG PO TABS: 20.0000 mg | ORAL_TABLET | Freq: Every day | ORAL | Status: DC

## 2023-08-07 MED ORDER — BUPIVACAINE HCL (PF) 0.5 % IJ SOLN
INTRAMUSCULAR | Status: DC | PRN
Start: 2023-08-07 — End: 2023-08-07
  Administered 2023-08-07: 25 mL via PERINEURAL

## 2023-08-07 MED ORDER — OXYCODONE HCL 5 MG/5ML PO SOLN
5.0000 mg | Freq: Once | ORAL | Status: DC | PRN
Start: 2023-08-07 — End: 2023-08-07

## 2023-08-07 MED ORDER — ORAL CARE MOUTH RINSE: 15.0000 mL | Freq: Once | OROMUCOSAL | Status: DC

## 2023-08-07 MED ORDER — TRAMADOL HCL 50 MG PO TABS: 50.0000 mg | ORAL_TABLET | Freq: Four times a day (QID) | ORAL | Status: DC | PRN

## 2023-08-07 MED ORDER — FUROSEMIDE 40 MG PO TABS
40.0000 mg | ORAL_TABLET | Freq: Every day | ORAL | Status: DC
  Administered 2023-08-08: 40 mg via ORAL
  Filled 2023-08-07: qty 1

## 2023-08-07 MED ORDER — POVIDONE-IODINE 10 % EX SWAB: 2.0000 | Freq: Once | CUTANEOUS | Status: DC

## 2023-08-07 MED ORDER — ONDANSETRON HCL 4 MG/2ML IJ SOLN
4.0000 mg | Freq: Four times a day (QID) | INTRAMUSCULAR | Status: DC | PRN
Start: 2023-08-07 — End: 2023-08-08

## 2023-08-07 MED ORDER — LACTATED RINGERS IV SOLN: INTRAVENOUS | Status: DC

## 2023-08-07 MED ORDER — ONDANSETRON HCL 4 MG/2ML IJ SOLN
INTRAMUSCULAR | Status: AC
  Filled 2023-08-07: qty 2

## 2023-08-07 MED ORDER — SODIUM CHLORIDE (PF) 0.9 % IJ SOLN
INTRAMUSCULAR | Status: AC
  Filled 2023-08-07: qty 10

## 2023-08-07 MED ORDER — OXYCODONE HCL 5 MG PO TABS: 10.0000 mg | ORAL_TABLET | ORAL | Status: DC | PRN

## 2023-08-07 MED ORDER — CHLORHEXIDINE GLUCONATE 0.12 % MT SOLN: 15.0000 mL | Freq: Once | OROMUCOSAL | Status: DC

## 2023-08-07 MED ORDER — OXYCODONE HCL 5 MG PO TABS
5.0000 mg | ORAL_TABLET | ORAL | Status: DC | PRN
Start: 2023-08-07 — End: 2023-08-08
  Administered 2023-08-07 – 2023-08-08 (×2): 5 mg via ORAL
  Filled 2023-08-07 (×2): qty 1
  Filled 2023-08-07: qty 2

## 2023-08-07 MED ORDER — FLEET ENEMA RE ENEM
1.0000 | ENEMA | Freq: Once | RECTAL | Status: DC | PRN
Start: 2023-08-07 — End: 2023-08-08

## 2023-08-07 MED ORDER — LEVOFLOXACIN IN D5W 500 MG/100ML IV SOLN
500.0000 mg | INTRAVENOUS | Status: AC
  Administered 2023-08-07: 500 mg via INTRAVENOUS
  Filled 2023-08-07: qty 100

## 2023-08-07 MED ORDER — MIDAZOLAM HCL 2 MG/2ML IJ SOLN: 1.0000 mg | INTRAMUSCULAR | Status: DC

## 2023-08-07 MED ORDER — ACETAMINOPHEN 325 MG PO TABS: 325.0000 mg | ORAL_TABLET | Freq: Four times a day (QID) | ORAL | Status: DC | PRN

## 2023-08-07 MED ORDER — VANCOMYCIN HCL IN DEXTROSE 1-5 GM/200ML-% IV SOLN
1000.0000 mg | Freq: Two times a day (BID) | INTRAVENOUS | Status: AC
  Administered 2023-08-08: 1000 mg via INTRAVENOUS
  Filled 2023-08-07: qty 200

## 2023-08-07 MED ORDER — FENTANYL CITRATE PF 50 MCG/ML IJ SOSY: 25.0000 ug | PREFILLED_SYRINGE | INTRAMUSCULAR | Status: DC | PRN

## 2023-08-07 MED ORDER — TADALAFIL 20 MG PO TABS
40.0000 mg | ORAL_TABLET | Freq: Every day | ORAL | Status: DC
Start: 2023-08-08 — End: 2023-08-08
  Administered 2023-08-08: 40 mg via ORAL
  Filled 2023-08-07: qty 2

## 2023-08-07 MED ORDER — RIVAROXABAN 10 MG PO TABS
10.0000 mg | ORAL_TABLET | Freq: Every day | ORAL | Status: DC
  Administered 2023-08-08: 10 mg via ORAL
  Filled 2023-08-07: qty 1

## 2023-08-07 MED ORDER — ACETAMINOPHEN 10 MG/ML IV SOLN
1000.0000 mg | Freq: Once | INTRAVENOUS | Status: AC
  Administered 2023-08-07: 1000 mg via INTRAVENOUS
  Filled 2023-08-07: qty 100

## 2023-08-07 MED ORDER — PROPOFOL 1000 MG/100ML IV EMUL
INTRAVENOUS | Status: AC
  Filled 2023-08-07: qty 100

## 2023-08-07 MED ORDER — ONDANSETRON HCL 4 MG/2ML IJ SOLN: 4.0000 mg | Freq: Four times a day (QID) | INTRAMUSCULAR | Status: DC | PRN

## 2023-08-07 MED ORDER — OXYCODONE HCL 5 MG PO TABS: 5.0000 mg | ORAL_TABLET | Freq: Once | ORAL | Status: DC | PRN

## 2023-08-07 MED ORDER — BUPIVACAINE LIPOSOME 1.3 % IJ SUSP
INTRAMUSCULAR | Status: AC
  Filled 2023-08-07: qty 20

## 2023-08-07 MED ORDER — ALLOPURINOL 100 MG PO TABS
100.0000 mg | ORAL_TABLET | Freq: Every day | ORAL | Status: DC
  Administered 2023-08-08: 100 mg via ORAL
  Filled 2023-08-07: qty 1

## 2023-08-07 MED ORDER — ATENOLOL 50 MG PO TABS
50.0000 mg | ORAL_TABLET | Freq: Every day | ORAL | Status: DC
  Administered 2023-08-08: 50 mg via ORAL
  Filled 2023-08-07: qty 1

## 2023-08-07 MED ORDER — 0.9 % SODIUM CHLORIDE (POUR BTL) OPTIME
TOPICAL | Status: DC | PRN
  Administered 2023-08-07: 1000 mL

## 2023-08-07 MED ORDER — ONDANSETRON HCL 4 MG PO TABS: 4.0000 mg | ORAL_TABLET | Freq: Four times a day (QID) | ORAL | Status: DC | PRN

## 2023-08-07 MED ORDER — FENTANYL CITRATE PF 50 MCG/ML IJ SOSY
50.0000 ug | PREFILLED_SYRINGE | INTRAMUSCULAR | Status: DC
Start: 2023-08-07 — End: 2023-08-07
  Administered 2023-08-07: 50 ug via INTRAVENOUS
  Filled 2023-08-07: qty 2

## 2023-08-07 MED ORDER — STERILE WATER FOR IRRIGATION IR SOLN
Status: DC | PRN
  Administered 2023-08-07: 1000 mL

## 2023-08-07 MED ORDER — DEXAMETHASONE SODIUM PHOSPHATE 10 MG/ML IJ SOLN
10.0000 mg | Freq: Once | INTRAMUSCULAR | Status: AC
  Administered 2023-08-08: 10 mg via INTRAVENOUS
  Filled 2023-08-07: qty 1

## 2023-08-07 MED ORDER — DEXAMETHASONE SODIUM PHOSPHATE 10 MG/ML IJ SOLN
INTRAMUSCULAR | Status: AC
  Filled 2023-08-07: qty 1

## 2023-08-07 MED ORDER — ACETAMINOPHEN 500 MG PO TABS
1000.0000 mg | ORAL_TABLET | Freq: Four times a day (QID) | ORAL | Status: DC
Start: 2023-08-07 — End: 2023-08-08
  Administered 2023-08-07 – 2023-08-08 (×3): 1000 mg via ORAL
  Filled 2023-08-07 (×3): qty 2

## 2023-08-07 MED ORDER — MENTHOL 3 MG MT LOZG: 1.0000 | LOZENGE | OROMUCOSAL | Status: DC | PRN

## 2023-08-07 MED ORDER — FUROSEMIDE 20 MG PO TABS: 20.0000 mg | ORAL_TABLET | ORAL | Status: DC

## 2023-08-07 MED ORDER — METOCLOPRAMIDE HCL 5 MG/ML IJ SOLN: 5.0000 mg | Freq: Three times a day (TID) | INTRAMUSCULAR | Status: DC | PRN

## 2023-08-07 MED ORDER — BISACODYL 10 MG RE SUPP: 10.0000 mg | Freq: Every day | RECTAL | Status: DC | PRN

## 2023-08-07 MED ORDER — METHOCARBAMOL 1000 MG/10ML IJ SOLN: 500.0000 mg | Freq: Four times a day (QID) | INTRAMUSCULAR | Status: DC | PRN

## 2023-08-07 MED ORDER — MORPHINE SULFATE (PF) 2 MG/ML IV SOLN: 1.0000 mg | INTRAVENOUS | Status: DC | PRN

## 2023-08-07 MED ORDER — PHENYLEPHRINE HCL-NACL 20-0.9 MG/250ML-% IV SOLN
INTRAVENOUS | Status: DC | PRN
  Administered 2023-08-07: 40 ug/min via INTRAVENOUS

## 2023-08-07 MED ORDER — DOCUSATE SODIUM 100 MG PO CAPS
100.0000 mg | ORAL_CAPSULE | Freq: Two times a day (BID) | ORAL | Status: DC
  Administered 2023-08-07 – 2023-08-08 (×2): 100 mg via ORAL
  Filled 2023-08-07 (×2): qty 1

## 2023-08-07 MED ORDER — SODIUM CHLORIDE (PF) 0.9 % IJ SOLN
INTRAMUSCULAR | Status: AC
  Filled 2023-08-07: qty 50

## 2023-08-07 MED ORDER — PROPOFOL 500 MG/50ML IV EMUL
INTRAVENOUS | Status: DC | PRN
  Administered 2023-08-07: 80 ug/kg/min via INTRAVENOUS

## 2023-08-07 MED ORDER — LEVOTHYROXINE SODIUM 75 MCG PO TABS
75.0000 ug | ORAL_TABLET | Freq: Every day | ORAL | Status: DC
  Administered 2023-08-08: 75 ug via ORAL
  Filled 2023-08-07: qty 1

## 2023-08-07 MED ORDER — EMPAGLIFLOZIN 10 MG PO TABS
10.0000 mg | ORAL_TABLET | Freq: Every day | ORAL | Status: DC
  Administered 2023-08-08: 10 mg via ORAL
  Filled 2023-08-07: qty 1

## 2023-08-07 MED ORDER — POLYVINYL ALCOHOL 1.4 % OP SOLN: 1.0000 [drp] | Freq: Three times a day (TID) | OPHTHALMIC | Status: DC | PRN

## 2023-08-07 MED ORDER — PHENYLEPHRINE HCL-NACL 20-0.9 MG/250ML-% IV SOLN
INTRAVENOUS | Status: AC
  Filled 2023-08-07: qty 250

## 2023-08-07 MED ORDER — PHENOL 1.4 % MT LIQD: 1.0000 | OROMUCOSAL | Status: DC | PRN

## 2023-08-07 MED ORDER — POTASSIUM CHLORIDE CRYS ER 20 MEQ PO TBCR
20.0000 meq | EXTENDED_RELEASE_TABLET | Freq: Two times a day (BID) | ORAL | Status: DC
  Administered 2023-08-08: 20 meq via ORAL
  Filled 2023-08-07: qty 1

## 2023-08-07 MED ORDER — ROSUVASTATIN CALCIUM 5 MG PO TABS
5.0000 mg | ORAL_TABLET | Freq: Every day | ORAL | Status: DC
  Administered 2023-08-08: 5 mg via ORAL
  Filled 2023-08-07: qty 1

## 2023-08-07 MED ORDER — VANCOMYCIN HCL IN DEXTROSE 1-5 GM/200ML-% IV SOLN
1000.0000 mg | INTRAVENOUS | Status: AC
  Administered 2023-08-07: 1000 mg via INTRAVENOUS
  Filled 2023-08-07: qty 200

## 2023-08-07 SURGICAL SUPPLY — 46 items
BAG COUNTER SPONGE SURGICOUNT (BAG) IMPLANT
BAG ZIPLOCK 12X15 (MISCELLANEOUS) IMPLANT
BLADE SAG 18X100X1.27 (BLADE) ×1 IMPLANT
BLADE SAW SGTL 11.0X1.19X90.0M (BLADE) ×1 IMPLANT
BLADE SURG SZ10 CARB STEEL (BLADE) IMPLANT
BNDG ELASTIC 6INX 5YD STR LF (GAUZE/BANDAGES/DRESSINGS) ×1 IMPLANT
COVER SURGICAL LIGHT HANDLE (MISCELLANEOUS) ×1 IMPLANT
CUFF TRNQT CYL 34X4.125X (TOURNIQUET CUFF) ×1 IMPLANT
DERMABOND ADVANCED .7 DNX12 (GAUZE/BANDAGES/DRESSINGS) IMPLANT
DRAPE U-SHAPE 47X51 STRL (DRAPES) ×1 IMPLANT
DRSG AQUACEL AG ADV 3.5X10 (GAUZE/BANDAGES/DRESSINGS) ×1 IMPLANT
DURAPREP 26ML APPLICATOR (WOUND CARE) ×1 IMPLANT
ELECT REM PT RETURN 15FT ADLT (MISCELLANEOUS) ×1 IMPLANT
GLOVE BIO SURGEON STRL SZ 6.5 (GLOVE) IMPLANT
GLOVE BIO SURGEON STRL SZ7 (GLOVE) IMPLANT
GLOVE BIO SURGEON STRL SZ8 (GLOVE) ×2 IMPLANT
GLOVE BIOGEL PI IND STRL 6.5 (GLOVE) IMPLANT
GLOVE BIOGEL PI IND STRL 7.0 (GLOVE) IMPLANT
GLOVE BIOGEL PI IND STRL 8 (GLOVE) ×1 IMPLANT
GOWN STRL REUS W/ TWL LRG LVL3 (GOWN DISPOSABLE) ×1 IMPLANT
HOLDER FOLEY CATH W/STRAP (MISCELLANEOUS) IMPLANT
IMMOBILIZER KNEE 20 (SOFTGOODS) ×1
IMMOBILIZER KNEE 20 THIGH 36 (SOFTGOODS) ×1 IMPLANT
INSERT LCL COMP RP STD 17.5MM (Knees) IMPLANT
KIT TURNOVER KIT A (KITS) IMPLANT
MANIFOLD NEPTUNE II (INSTRUMENTS) ×1 IMPLANT
NS IRRIG 1000ML POUR BTL (IV SOLUTION) ×1 IMPLANT
PACK TOTAL KNEE CUSTOM (KITS) ×1 IMPLANT
PADDING CAST COTTON 6X4 STRL (CAST SUPPLIES) ×2 IMPLANT
PROTECTOR NERVE ULNAR (MISCELLANEOUS) ×1 IMPLANT
SET HNDPC FAN SPRY TIP SCT (DISPOSABLE) ×1 IMPLANT
SPIKE FLUID TRANSFER (MISCELLANEOUS) IMPLANT
STRIP CLOSURE SKIN 1/2X4 (GAUZE/BANDAGES/DRESSINGS) ×2 IMPLANT
SUT MNCRL AB 4-0 PS2 18 (SUTURE) ×1 IMPLANT
SUT STRATAFIX 0 PDS 27 VIOLET (SUTURE) ×1
SUT VIC AB 2-0 CT1 TAPERPNT 27 (SUTURE) ×3 IMPLANT
SUTURE STRATFX 0 PDS 27 VIOLET (SUTURE) ×1 IMPLANT
SWAB COLLECTION DEVICE MRSA (MISCELLANEOUS) IMPLANT
SWAB CULTURE ESWAB REG 1ML (MISCELLANEOUS) IMPLANT
TOWER CARTRIDGE SMART MIX (DISPOSABLE) ×1 IMPLANT
TRAY FOLEY MTR SLVR 14FR STAT (SET/KITS/TRAYS/PACK) IMPLANT
TRAY FOLEY MTR SLVR 16FR STAT (SET/KITS/TRAYS/PACK) ×1 IMPLANT
TUBE KAMVAC SUCTION (TUBING) IMPLANT
TUBE SUCTION HIGH CAP CLEAR NV (SUCTIONS) ×1 IMPLANT
WATER STERILE IRR 1000ML POUR (IV SOLUTION) ×1 IMPLANT
WRAP KNEE MAXI GEL POST OP (GAUZE/BANDAGES/DRESSINGS) IMPLANT

## 2023-08-07 NOTE — Progress Notes (Signed)
Orthopedic Tech Progress Note Patient Details:  [name] 1950/09/13 [id number]  CPM Left Knee CPM Left Knee: Off CPM Right Knee CPM Right Knee: On Right Knee Flexion (Degrees): 40 Right Knee Extension (Degrees): 10  Post Interventions Patient Tolerated: Well  Darleen Crocker 08/07/2023, 6:35 PM

## 2023-08-07 NOTE — Op Note (Unsigned)
NAME: [name], [name] MEDICAL RECORD NO: [id number] ACCOUNT NO: [id number] DATE OF BIRTH: 1951-08-15 FACILITY: [facility] LOCATION: [id number] PHYSICIAN: Gus Rankin. 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  PROCEDURE PERFORMED:  Right knee polyethylene revision.  SURGEON:  Gus Rankin. Guthrie Lemme, MD  ASSISTANT:  Weston Brass, PA-C  ANESTHESIA:  Spinal.  ESTIMATED BLOOD LOSS:  10 mL.  DRAINS:  None.  COMPLICATIONS:  None.  TOURNIQUET TIME:  21 minutes at 300 mmHg.  CONDITION:  Stable recovery.  BRIEF CLINICAL NOTE:  The patient is a 72 year old female who had a right total knee arthroplasty performed over 15 years ago by Dr. Priscille Kluver.  She had done well but recently started developing instability in the knee and inability to bear weight.  Her  radiographs showed that the polyethylene was fully worn and the knee was grossly unstable.  She presents now for polyethylene versus total knee revision.  DESCRIPTION OF PROCEDURE:  After successful administration of spinal anesthetic, a tourniquet was placed on the right thigh and right lower extremity.  It was prepped and draped in the usual sterile fashion.  Extremity was wrapped in Esmarch and  tourniquet inflated to 300 mmHg.  Midline incision was made with a 10 blade through the subcutaneous tissue to the level of the extensor mechanism.  A fresh blade was used to make a medial parapatellar arthrotomy.  There was minimal fluid in the joint.   There is a fair amount of synovitis from polyethylene wear and some metallic staining of the synovium.  Thorough synovectomy was performed to get back to normal-appearing tissue.  The soft tissue over the proximal medial tibia was then subperiosteally  elevated to the joint line with the knife and into  the semimembranosus bursa with a Cobb elevator.  Soft tissue laterally was elevated with attention being paid to avoid the patellar tendon on the tibial tubercle.  The patella was everted.  The knee  flexed 90 degrees.  This dislocated the tibia from the femur.  Almost all of the medial side of polyethylene was completely worn away.  There was some wear laterally also.  We removed the polyethylene from the tibial tray.  This was an LCS prosthesis and  the size was a standard 10 mm thick polyethylene.  I inspected the femoral and tibial components.  They were both well fixed and well positioned.  No evidence of any osteolysis around the components.  The patella was inspected and this was a  metal-backed mobile-bearing patella and there was no evidence of any wear of the patella.  We then did trials to get the appropriate size insert.  A 12.5 and 15 were too small and there was still some laxity.  A 17.5 full extension was achieved with  excellent varus, valgus, anterior and posterior balance throughout full range of motion.  The trial was then removed and a permanent 17.5 mm standard rotating platform LCS insert was placed into the tibial tray.  The knee was reduced with outstanding  stability.  The wound was then copiously irrigated with saline solution and topical TXA placed.  The arthrotomy was closed with running 0 Stratafix suture.  Flexion against gravity is 130 degrees.  Patella tracks normally and has excellent stability.   The tourniquet was then released for a total time of 21 minutes.  Minor bleeding  was stopped with cautery.  Subcutaneous was then closed with interrupted 2-0 Vicryl and subcuticular 4-0 Monocryl.  Incisions cleaned and dried and a sterile dressing  applied.  She was then placed into a knee immobilizer, awakened and transported to recovery in stable condition.  Note that the surgical assistance of medical necessity for this procedure to help retract the vital ligaments and  neurovascular structures  and for proper positioning of the limb for safe removal of the old implant and safe and accurate placement of the new implant.   PUS D: 08/07/2023 4:40:37 pm T: 08/07/2023 7:28:00 pm  JOB: [id number]/ [id number]

## 2023-08-07 NOTE — Progress Notes (Signed)
Orthopedic Tech Progress Note Patient Details:  [name] 05/05/1951 [id number]  CPM Left Knee CPM Left Knee: Off CPM Right Knee CPM Right Knee: Off Right Knee Flexion (Degrees): 40 Right Knee Extension (Degrees): 10  Post Interventions Patient Tolerated: Well  Darleen Crocker 08/07/2023, 8:46 PM

## 2023-08-07 NOTE — Transfer of Care (Signed)
Immediate Anesthesia Transfer of Care Note  Patient: [name]  Procedure(s) Performed: Right knee polyethylene  revision (Right: Knee)  Patient Location: PACU  Anesthesia Type:MAC  Level of Consciousness: awake and alert   Airway & Oxygen Therapy: Patient Spontanous Breathing and Patient connected to face mask oxygen  Post-op Assessment: Report given to RN and Post -op Vital signs reviewed and stable  Post vital signs: Reviewed and stable  Last Vitals:  Vitals Value Taken Time  BP    Temp    Pulse    Resp    SpO2      Last Pain:  Vitals:   08/07/23 1504  TempSrc:   PainSc: 0-No pain         Complications: No notable events documented.

## 2023-08-07 NOTE — Anesthesia Procedure Notes (Signed)
Anesthesia Regional Block: Adductor canal block   Pre-Anesthetic Checklist: , timeout performed,  Correct Patient, Correct Site, Correct Laterality,  Correct Procedure, Correct Position, site marked,  Risks and benefits discussed,  Surgical consent,  Pre-op evaluation,  At surgeon's request and post-op pain management  Laterality: Right  Prep: chloraprep       Needles:  Injection technique: Single-shot  Needle Type: Echogenic Needle     Needle Length: 9cm  Needle Gauge: 21     Additional Needles:   Narrative:  Start time: 08/07/2023 2:50 PM End time: 08/07/2023 2:58 PM Injection made incrementally with aspirations every 5 mL.  Performed by: Personally  Anesthesiologist: Achille Rich, MD  Additional Notes: Pt tolerated the procedure well.

## 2023-08-07 NOTE — Discharge Instructions (Addendum)
Stacy Gross, [name] Total Joint Specialist [facility] [address] [place], [place] [address] [phone]  TOTAL KNEE REVISION POSTOPERATIVE DIRECTIONS    Knee Rehabilitation, Guidelines Following Surgery  Results after knee surgery are often greatly improved when you follow the exercise, range of motion and muscle strengthening exercises prescribed by your doctor. Safety measures are also important to protect the knee from further injury. If any of these exercises cause you to have increased pain or swelling in your knee joint, decrease the amount until you are comfortable again and slowly increase them. If you have problems or questions, call your caregiver or physical therapist for advice.   BLOOD CLOT PREVENTION Resume your normal regimen of Xarelto upon discharge from the hospital. You may resume your vitamins/supplements upon discharge from the hospital. Do not take any NSAIDs (Advil, Aleve, Ibuprofen, Meloxicam, etc.) while taking Xarelto.    HOME CARE INSTRUCTIONS  Remove items at home which could result in a fall. This includes throw rugs or furniture in walking pathways.  ICE to the affected knee as much as tolerated. Icing helps control swelling. If the swelling is well controlled you will be more comfortable and rehab easier. Continue to use ice on the knee for pain and swelling from surgery. You may notice swelling that will progress down to the foot and ankle. This is normal after surgery. Elevate the leg when you are not up walking on it.    Continue to use the breathing machine which will help keep your temperature down. It is common for your temperature to cycle up and down following surgery, especially at night when you are not up moving around and exerting yourself. The breathing machine keeps your lungs expanded and your temperature down. Do not place pillow under the operative knee, focus on keeping the knee straight while  resting  DIET You may resume your previous home diet once you are discharged from the hospital.  DRESSING / WOUND CARE / SHOWERING Keep your bulky bandage on for 2 days. On the third post-operative day you may remove the Ace bandage and gauze. There is a waterproof adhesive bandage on your skin which will stay in place until your first follow-up appointment. Once you remove this you will not need to place another bandage You may begin showering 3 days following surgery, but do not submerge the incision under water.  ACTIVITY For the first 5 days, the key is rest and control of pain and swelling Do your home exercises twice a day starting on post-operative day 3. On the days you go to physical therapy, just do the home exercises once that day. You should rest, ice and elevate the leg for 50 minutes out of every hour. Get up and walk/stretch for 10 minutes per hour. After 5 days you can increase your activity slowly as tolerated. Walk with your walker as instructed. Use the walker until you are comfortable transitioning to a cane. Walk with the cane in the opposite hand of the operative leg. You may discontinue the cane once you are comfortable and walking steadily. Avoid periods of inactivity such as sitting longer than an hour when not asleep. This helps prevent blood clots.  You may discontinue the knee immobilizer once you are able to perform a straight leg raise while lying down. You may resume a sexual relationship in one month or when given the OK by your doctor.  You may return to work once you are cleared by your doctor.  Do not drive a car for 6 weeks or until released by your surgeon.  Do not drive while taking narcotics.  TED HOSE STOCKINGS Wear the elastic stockings on both legs for three weeks following surgery during the day. You may remove them at night for sleeping.  WEIGHT BEARING Weight bearing as tolerated with assist device (walker, cane, etc) as directed, use it as long  as suggested by your surgeon or therapist, typically at least 4-6 weeks.  POSTOPERATIVE CONSTIPATION PROTOCOL Constipation - defined medically as fewer than three stools per week and severe constipation as less than one stool per week.  One of the most common issues patients have following surgery is constipation.  Even if you have a regular bowel pattern at home, your normal regimen is likely to be disrupted due to multiple reasons following surgery.  Combination of anesthesia, postoperative narcotics, change in appetite and fluid intake all can affect your bowels.  In order to avoid complications following surgery, here are some recommendations in order to help you during your recovery period.  Colace (docusate) - Pick up an over-the-counter form of Colace or another stool softener and take twice a day as long as you are requiring postoperative pain medications.  Take with a full glass of water daily.  If you experience loose stools or diarrhea, hold the colace until you stool forms back up. If your symptoms do not get better within 1 week or if they get worse, check with your doctor. Dulcolax (bisacodyl) - Pick up over-the-counter and take as directed by the product packaging as needed to assist with the movement of your bowels.  Take with a full glass of water.  Use this product as needed if not relieved by Colace only.  MiraLax (polyethylene glycol) - Pick up over-the-counter to have on hand. MiraLax is a solution that will increase the amount of water in your bowels to assist with bowel movements.  Take as directed and can mix with a glass of water, juice, soda, coffee, or tea. Take if you go more than two days without a movement. Do not use MiraLax more than once per day. Call your doctor if you are still constipated or irregular after using this medication for 7 days in a row.  If you continue to have problems with postoperative constipation, please contact the office for further assistance and  recommendations.  If you experience "the worst abdominal pain ever" or develop nausea or vomiting, please contact the office immediatly for further recommendations for treatment.  ITCHING If you experience itching with your medications, try taking only a single pain pill, or even half a pain pill at a time.  You can also use Benadryl over the counter for itching or also to help with sleep.   MEDICATIONS See your medication summary on the "After Visit Summary" that the nursing staff will review with you prior to discharge.  You may have some home medications which will be placed on hold until you complete the course of blood thinner medication.  It is important for you to complete the blood thinner medication as prescribed by your surgeon.  Continue your approved medications as instructed at time of discharge.  PRECAUTIONS If you experience chest pain or shortness of breath - call 911 immediately for transfer to the hospital emergency department.  If you develop a fever greater that 101 F, purulent drainage from wound, increased redness or drainage from wound, foul odor from the wound/dressing, or calf pain - CONTACT YOUR SURGEON.  FOLLOW-UP APPOINTMENTS Make sure you keep all of your appointments after your operation with your surgeon and caregivers. You should call the office at the above phone number and make an appointment for approximately two weeks after the date of your surgery or on the date instructed by your surgeon outlined in the "After Visit Summary".  RANGE OF MOTION AND STRENGTHENING EXERCISES  Rehabilitation of the knee is important following a knee injury or an operation. After just a few days of immobilization, the muscles of the thigh which control the knee become weakened and shrink (atrophy). Knee exercises are designed to build up the tone and strength of the thigh muscles and to improve knee motion. Often times heat used for  twenty to thirty minutes before working out will loosen up your tissues and help with improving the range of motion but do not use heat for the first two weeks following surgery. These exercises can be done on a training (exercise) mat, on the floor, on a table or on a bed. Use what ever works the best and is most comfortable for you Knee exercises include:  Leg Lifts - While your knee is still immobilized in a splint or cast, you can do straight leg raises. Lift the leg to 60 degrees, hold for 3 sec, and slowly lower the leg. Repeat 10-20 times 2-3 times daily. Perform this exercise against resistance later as your knee gets better.  Quad and Hamstring Sets - Tighten up the muscle on the front of the thigh (Quad) and hold for 5-10 sec. Repeat this 10-20 times hourly. Hamstring sets are done by pushing the foot backward against an object and holding for 5-10 sec. Repeat as with quad sets.  Leg Slides: Lying on your back, slowly slide your foot toward your buttocks, bending your knee up off the floor (only go as far as is comfortable). Then slowly slide your foot back down until your leg is flat on the floor again. Angel Wings: Lying on your back spread your legs to the side as far apart as you can without causing discomfort.  A rehabilitation program following serious knee injuries can speed recovery and prevent re-injury in the future due to weakened muscles. Contact your doctor or a physical therapist for more information on knee rehabilitation.   POST-OPERATIVE OPIOID TAPER INSTRUCTIONS: It is important to wean off of your opioid medication as soon as possible. If you do not need pain medication after your surgery it is ok to stop day one. Opioids include: Codeine, Hydrocodone(Norco, Vicodin), Oxycodone(Percocet, oxycontin) and hydromorphone amongst others.  Long term and even short term use of opiods can cause: Increased pain response Dependence Constipation Depression Respiratory depression And  more.  Withdrawal symptoms can include Flu like symptoms Nausea, vomiting And more Techniques to manage these symptoms Hydrate well Eat regular healthy meals Stay active Use relaxation techniques(deep breathing, meditating, yoga) Do Not substitute Alcohol to help with tapering If you have been on opioids for less than two weeks and do not have pain than it is ok to stop all together.  Plan to wean off of opioids This plan should start within one week post op of your joint replacement. Maintain the same interval or time between taking each dose and first decrease the dose.  Cut the total daily intake of opioids by one tablet each day Next start to increase the time between doses. The last dose that should be eliminated is the evening dose.   IF YOU ARE TRANSFERRED TO  A SKILLED REHAB FACILITY If the patient is transferred to a skilled rehab facility following release from the hospital, a list of the current medications will be sent to the facility for the patient to continue.  When discharged from the skilled rehab facility, please have the facility set up the patient's Home Health Physical Therapy prior to being released. Also, the skilled facility will be responsible for providing the patient with their medications at time of release from the facility to include their pain medication, the muscle relaxants, and their blood thinner medication. If the patient is still at the rehab facility at time of the two week follow up appointment, the skilled rehab facility will also need to assist the patient in arranging follow up appointment in our office and any transportation needs.  MAKE SURE YOU:  Understand these instructions.  Get help right away if you are not doing well or get worse.   DENTAL ANTIBIOTICS:  In most cases prophylactic antibiotics for Dental procdeures after total joint surgery are not necessary.  Exceptions are as follows:  1. History of prior total joint infection  2.  Severely immunocompromised (Organ Transplant, cancer chemotherapy, Rheumatoid biologic meds such as Humera)  3. Poorly controlled diabetes (A1C &gt; 8.0, blood glucose over 200)  If you have one of these conditions, contact your surgeon for an antibiotic prescription, prior to your dental procedure.    Pick up stool softner and laxative for home use following surgery while on pain medications. Do not submerge incision under water. Please use good hand washing techniques while changing dressing each day. May shower starting three days after surgery. Please use a clean towel to pat the incision dry following showers. Continue to use ice for pain and swelling after surgery. Do not use any lotions or creams on the incision until instructed by your surgeon.

## 2023-08-07 NOTE — Interval H&P Note (Signed)
History and Physical Interval Note:  08/07/2023 2:16 PM  [name]  has presented today for surgery, with the diagnosis of Failed right total knee arthroplasty.  The various methods of treatment have been discussed with the patient and family. After consideration of risks, benefits and other options for treatment, the patient has consented to  Procedure(s): Right knee polyethylene versus total knee arthroplasty revision (Right) as a surgical intervention.  The patient's history has been reviewed, patient examined, no change in status, stable for surgery.  I have reviewed the patient's chart and labs.  Questions were answered to the patient's satisfaction.     Homero Fellers Horice Carrero

## 2023-08-07 NOTE — Progress Notes (Signed)
   08/07/23 2117  BiPAP/CPAP/SIPAP  BiPAP/CPAP/SIPAP Pt Type Adult  Reason BIPAP/CPAP not in use Non-compliant (pt decided to not wear cpap tonight due to not being able to add humidity, pt states she wakes up with a sore throat without it.)

## 2023-08-07 NOTE — Brief Op Note (Signed)
08/07/2023  4:34 PM  PATIENT:  [name]  72 y.o. female  PRE-OPERATIVE DIAGNOSIS:  Failed right total knee arthroplasty  POST-OPERATIVE DIAGNOSIS:  Failed right total knee arthroplasty  PROCEDURE:  Procedure(s): Right knee polyethylene  revision (Right)  SURGEON:  Surgeons and Role:    Ollen Gross, MD - Primary  PHYSICIAN ASSISTANT:   ASSISTANTS: Weston Brass, PA-C   ANESTHESIA:   regional and spinal  EBL:  10 ml   BLOOD ADMINISTERED:none  DRAINS: none   LOCAL MEDICATIONS USED:  NONE  COUNTS:  YES  TOURNIQUET:   Total Tourniquet Time Documented: Thigh (Right) - 21 minutes Total: Thigh (Right) - 21 minutes   DICTATION: .Other Dictation: Dictation Number [id number]  PLAN OF CARE: Admit to inpatient   PATIENT DISPOSITION:  PACU - hemodynamically stable.

## 2023-08-07 NOTE — Anesthesia Procedure Notes (Signed)
Spinal  Patient location during procedure: OR Start time: 08/07/2023 3:35 PM End time: 08/07/2023 3:37 PM Reason for block: surgical anesthesia Staffing Performed: anesthesiologist  Anesthesiologist: Achille Rich, MD Performed by: Achille Rich, MD Authorized by: Achille Rich, MD   Preanesthetic Checklist Completed: patient identified, IV checked, risks and benefits discussed, surgical consent, monitors and equipment checked, pre-op evaluation and timeout performed Spinal Block Patient position: sitting Prep: DuraPrep Patient monitoring: cardiac monitor, continuous pulse ox and blood pressure Approach: midline Location: L3-4 Injection technique: single-shot Needle Needle type: Pencan  Needle gauge: 24 G Needle length: 9 cm Assessment Sensory level: T10 Events: CSF return Additional Notes Functioning IV was confirmed and monitors were applied. Sterile prep and drape, including hand hygiene and sterile gloves were used. The patient was positioned and the spine was prepped. The skin was anesthetized with lidocaine.  Free flow of clear CSF was obtained prior to injecting local anesthetic into the CSF.  The spinal needle aspirated freely following injection.  The needle was carefully withdrawn.  The patient tolerated the procedure well.

## 2023-08-08 ENCOUNTER — Encounter (HOSPITAL_COMMUNITY): Payer: Self-pay | Admitting: Orthopedic Surgery

## 2023-08-08 LAB — BASIC METABOLIC PANEL
Anion gap: 6 (ref 5–15)
BUN: 21 mg/dL (ref 8–23)
CO2: 18 mmol/L — ABNORMAL LOW (ref 22–32)
Calcium: 8.5 mg/dL — ABNORMAL LOW (ref 8.9–10.3)
Chloride: 110 mmol/L (ref 98–111)
Creatinine, Ser: 1.08 mg/dL — ABNORMAL HIGH (ref 0.44–1.00)
GFR, Estimated: 54 mL/min — ABNORMAL LOW (ref >60)
Glucose, Bld: 140 mg/dL — ABNORMAL HIGH (ref 70–99)
Potassium: 4.4 mmol/L (ref 3.5–5.1)
Sodium: 134 mmol/L — ABNORMAL LOW (ref 135–145)

## 2023-08-08 LAB — CBC
HCT: 47.5 % — ABNORMAL HIGH (ref 36.0–46.0)
Hemoglobin: 15.5 g/dL — ABNORMAL HIGH (ref 12.0–15.0)
MCH: 33.5 pg (ref 26.0–34.0)
MCHC: 32.6 g/dL (ref 30.0–36.0)
MCV: 102.6 fL — ABNORMAL HIGH (ref 80.0–100.0)
Platelets: 84 10*3/uL — ABNORMAL LOW (ref 150–400)
RBC: 4.63 MIL/uL (ref 3.87–5.11)
RDW: 13.9 % (ref 11.5–15.5)
WBC: 6.2 10*3/uL (ref 4.0–10.5)
nRBC: 0 % (ref 0.0–0.2)

## 2023-08-08 MED ORDER — TRAMADOL HCL 50 MG PO TABS
50.0000 mg | ORAL_TABLET | Freq: Four times a day (QID) | ORAL | Status: DC | PRN
Start: 2023-08-08 — End: 2023-08-08

## 2023-08-08 MED ORDER — ONDANSETRON HCL 4 MG PO TABS: 4.0000 mg | ORAL_TABLET | Freq: Four times a day (QID) | ORAL | 0 refills | Status: DC | PRN

## 2023-08-08 MED ORDER — OXYCODONE HCL 5 MG PO TABS: 5.0000 mg | ORAL_TABLET | Freq: Four times a day (QID) | ORAL | 0 refills | Status: DC | PRN

## 2023-08-08 MED ORDER — METHOCARBAMOL 500 MG PO TABS: 500.0000 mg | ORAL_TABLET | Freq: Four times a day (QID) | ORAL | 0 refills | Status: DC | PRN

## 2023-08-08 NOTE — Anesthesia Postprocedure Evaluation (Signed)
Anesthesia Post Note  Patient: [name]  Procedure(s) Performed: Right knee polyethylene  revision (Right: Knee)     Patient location during evaluation: PACU Anesthesia Type: MAC, Spinal and Regional Level of consciousness: oriented and awake and alert Pain management: pain level controlled Vital Signs Assessment: post-procedure vital signs reviewed and stable Respiratory status: spontaneous breathing, respiratory function stable and patient connected to nasal cannula oxygen Cardiovascular status: blood pressure returned to baseline and stable Postop Assessment: no headache, no backache and no apparent nausea or vomiting Anesthetic complications: no   No notable events documented.  Last Vitals:  Vitals:   08/08/23 0204 08/08/23 0523  BP: (!) 156/88 (!) 137/90  Pulse: (!) 56 61  Resp: 18 17  Temp:  36.8 C  SpO2: 94% 93%    Last Pain:  Vitals:   08/07/23 2313  TempSrc:   PainSc: 4                  Ravina Milner S
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
## 2023-08-08 NOTE — Progress Notes (Signed)
Physical Therapy Treatment Patient Details Name: [name] MRN: [id number] DOB: 1951-02-25 Today's Date: 08/08/2023   History of Present Illness Laurice Sas is a 72 yo female s/p Right knee polyethylene revision 08/07/23. PMH: afib, CHF, CVA, R TKA, L TKA, osteoporosis, neuropathy, stroke    PT Comments  Provided pt with written/illustrated HEP handout, provided education and answered all questions, pt also reports starting OPPT on Thursday. Answered all questions and education complete. Pt's sister at bedside, pt dressed and ready to d/c home with family support and OPPT.   If plan is discharge home, recommend the following: A little help with walking and/or transfers;A little help with bathing/dressing/bathroom;Assistance with cooking/housework;Help with stairs or ramp for entrance;Assist for transportation   Can travel by private vehicle        Equipment Recommendations  None recommended by PT    Recommendations for Other Services       Precautions / Restrictions Precautions Precautions: Fall;Knee Restrictions Weight Bearing Restrictions: No     Mobility  Bed Mobility   Transfers   Ambulation/Gait    Stairs    Wheelchair Mobility     Tilt Bed    Modified Rankin (Stroke Patients Only)       Balance Overall balance assessment: Needs assistance Sitting-balance support: Feet supported Sitting balance-Leahy Scale: Good     Standing balance support: Reliant on assistive device for balance, During functional activity, Bilateral upper extremity supported Standing balance-Leahy Scale: Poor                              Cognition Arousal: Alert Behavior During Therapy: WFL for tasks assessed/performed Overall Cognitive Status: Within Functional Limits for tasks assessed                                          Exercises Total Joint Exercises Ankle Circles/Pumps: AROM, Both, 10 reps, Supine    General Comments  General comments (skin integrity, edema, etc.): pt on RA with SpO2 89-95%      Pertinent Vitals/Pain Pain Assessment Pain Assessment: 0-10 Pain Score: 1  Pain Location: R knee Pain Descriptors / Indicators: Discomfort Pain Intervention(s): Limited activity within patient's tolerance, Monitored during session    Home Living Family/patient expects to be discharged to:: Private residence Living Arrangements: Alone Available Help at Discharge: Family;Available 24 hours/day (sister planning to stay 24/7 initially) Type of Home: House Home Access: Stairs to enter Entrance Stairs-Rails: Left Entrance Stairs-Number of Steps: 2 (steps are wide enough for RW to sit flat)   Home Layout: One level Home Equipment: Agricultural consultant (2 wheels);Rollator (4 wheels);Cane - single point;Shower seat - built in;Hand held shower head;Grab bars - tub/shower      Prior Function            PT Goals (current goals can now be found in the care plan section) Acute Rehab PT Goals Patient Stated Goal: return home with sister to assist PT Goal Formulation: With patient Time For Goal Achievement: 08/22/23 Potential to Achieve Goals: Good Progress towards PT goals: Progressing toward goals    Frequency    Min 1X/week      PT Plan      Co-evaluation              AM-PAC PT "6 Clicks" Mobility   Outcome Measure  Help needed turning from  your back to your side while in a flat bed without using bedrails?: None Help needed moving from lying on your back to sitting on the side of a flat bed without using bedrails?: None Help needed moving to and from a bed to a chair (including a wheelchair)?: A Little Help needed standing up from a chair using your arms (e.g., wheelchair or bedside chair)?: A Little Help needed to walk in hospital room?: A Little Help needed climbing 3-5 steps with a railing? : A Little 6 Click Score: 20    End of Session Equipment Utilized During Treatment: Gait  belt Activity Tolerance: Patient tolerated treatment well Patient left: in chair;with call bell/phone within reach;with nursing/sitter in room Nurse Communication: Mobility status PT Visit Diagnosis: Muscle weakness (generalized) (M62.81);Pain;Other abnormalities of gait and mobility (R26.89) Pain - Right/Left: Right Pain - part of body: Knee     Time: 1300-1309 PT Time Calculation (min) (ACUTE ONLY): 9 min  Charges:    $Therapeutic Exercise: 8-22 mins PT General Charges $$ ACUTE PT VISIT: 1 Visit                     Tori Weslee Fogg PT, DPT 08/08/23, 1:30 PM

## 2023-08-08 NOTE — Progress Notes (Signed)
Discharge package printed and instructions given to pt. Pt verbalizes understanding. 

## 2023-08-08 NOTE — Care Management Important Message (Signed)
Important Message  Patient Details IM Letter given. Name: Latya Hendricksen MRN: [id number] Date of Birth: Jul 22, 1951   Important Message Given:  Yes - Medicare IM     Caren Macadam 08/08/2023, 11:28 AM

## 2023-08-08 NOTE — Plan of Care (Signed)
  Problem: Activity: Goal: Risk for activity intolerance will decrease Outcome: Progressing   Problem: Safety: Goal: Ability to remain free from injury will improve Outcome: Progressing   Problem: Pain Management: Goal: Pain level will decrease with appropriate interventions Outcome: Progressing

## 2023-08-08 NOTE — TOC Transition Note (Signed)
Transition of Care [facility]) - CM/SW Discharge Note   Patient Details  Name: [name] MRN: [id number] Date of Birth: 17-Mar-1951  Transition of Care [facility]) CM/SW Contact:  Amada Jupiter, LCSW Phone Number: 08/08/2023, 10:32 AM   Clinical Narrative:     Met with pt who confirms she has needed DME in the home.  OPPT already arranged with [facility] Gunner).  No further TOC needs.   Final next level of care: OP Rehab Barriers to Discharge: No Barriers Identified   Patient Goals and CMS Choice      Discharge Placement                         Discharge Plan and Services Additional resources added to the After Visit Summary for                  DME Arranged: N/A DME Agency: NA                  Social Determinants of Health (SDOH) Interventions SDOH Screenings   Food Insecurity: No Food Insecurity (08/07/2023)  Housing: Low Risk  (08/07/2023)  Transportation Needs: No Transportation Needs (08/07/2023)  Utilities: Not At Risk (08/07/2023)  Alcohol Screen: Low Risk  (05/26/2023)  Depression (PHQ2-9): Low Risk  (05/02/2023)  Financial Resource Strain: Medium Risk (08/04/2022)  Physical Activity: Insufficiently Active (05/26/2023)  Social Connections: Moderately Integrated (05/26/2023)  Stress: No Stress Concern Present (05/26/2023)  Tobacco Use: Low Risk  (08/07/2023)     Readmission Risk Interventions    08/08/2023   10:30 AM  Readmission Risk Prevention Plan  Transportation Screening Complete  PCP or Specialist Appt within 5-7 Days Complete  Home Care Screening Complete  Medication Review (RN CM) Complete

## 2023-08-08 NOTE — Evaluation (Signed)
Physical Therapy Evaluation Patient Details Name: [name] MRN: [id number] DOB: 1951-07-28 Today's Date: 08/08/2023  History of Present Illness  Beverlyann Roseland is a 72 yo female s/p Right knee polyethylene revision 08/07/23. PMH: afib, CHF, CVA, R TKA, L TKA, osteoporosis, neuropathy, stroke  Clinical Impression  Pt is s/p R knee polyethylene revision resulting in the deficits listed below (see PT Problem List). Pt ind at baseline using SPC, rollator or RW in the home and community, denies falls, drives. Pt POD1, currently mobilizing with supv for transfers and ambulation using RW. Pt with step to gait pattern improving to slight step through gait pattern with distance. Pt without knee buckling noted, minimal UE use on RW, reporting 1/10 "discomfort" in knee. Pt able to ascend/descend steps with RW, reports prefers to lead with RLE but able to complete with correct post-op sequencing ascending with L and descending with R, CGA for initial step and supv for 2nd. Pt reports sister will provide transportation and stay with her 24/7 until pt feels comfortable to stay home alone at night. All questions answered and education completed; pt with good family support and reports starting OPPT this week. Pt will benefit from acute skilled PT to increase their independence and safety with mobility to allow discharge.          If plan is discharge home, recommend the following: A little help with walking and/or transfers;A little help with bathing/dressing/bathroom;Assistance with cooking/housework;Help with stairs or ramp for entrance;Assist for transportation   Can travel by private vehicle        Equipment Recommendations None recommended by PT  Recommendations for Other Services       Functional Status Assessment Patient has had a recent decline in their functional status and demonstrates the ability to make significant improvements in function in a reasonable and predictable amount of time.      Precautions / Restrictions Precautions Precautions: Fall;Knee Restrictions Weight Bearing Restrictions: No      Mobility  Bed Mobility Overal bed mobility: Modified Independent             General bed mobility comments: self assists RLE to EOB, no assist or cues    Transfers Overall transfer level: Needs assistance Equipment used: Rolling walker (2 wheels) Transfers: Sit to/from Stand Sit to Stand: Supervision           General transfer comment: verbal cues for hand placement, powers to stand with BUE assisting    Ambulation/Gait Ambulation/Gait assistance: Supervision Gait Distance (Feet): 85 Feet Assistive device: Rolling walker (2 wheels) Gait Pattern/deviations: Step-to pattern, Step-through pattern, Decreased stride length, Decreased stance time - right Gait velocity: decreased     General Gait Details: step-to initially progressing to step through, slight decreased RLE stance time, no knee buckling noted or overt LOB, decreased cadence  Stairs Stairs: Yes Stairs assistance: Supervision Stair Management: One rail Left, With walker Number of Stairs: 2 General stair comments: pt ascends 6" step twice, once with RW and once with L handrail to simulate home set up, able to maintain correct sequencing, no overt LOB or unsteadiness  Wheelchair Mobility     Tilt Bed    Modified Rankin (Stroke Patients Only)       Balance Overall balance assessment: Needs assistance Sitting-balance support: Feet supported Sitting balance-Leahy Scale: Good     Standing balance support: Reliant on assistive device for balance, During functional activity, Bilateral upper extremity supported Standing balance-Leahy Scale: Poor  Pertinent Vitals/Pain Pain Assessment Pain Assessment: 0-10 Pain Score: 1  Pain Location: R knee Pain Descriptors / Indicators: Discomfort Pain Intervention(s): Limited activity within patient's  tolerance, Premedicated before session, Monitored during session, Repositioned, Ice applied    Home Living Family/patient expects to be discharged to:: Private residence Living Arrangements: Alone Available Help at Discharge: Family;Available 24 hours/day (sister planning to stay 24/7 initially) Type of Home: House Home Access: Stairs to enter Entrance Stairs-Rails: Left Entrance Stairs-Number of Steps: 2 (steps are wide enough for RW to sit flat)   Home Layout: One level Home Equipment: Agricultural consultant (2 wheels);Rollator (4 wheels);Cane - single point;Shower seat - built in;Hand held shower head;Grab bars - tub/shower      Prior Function Prior Level of Function : Independent/Modified Independent;Driving             Mobility Comments: pt reports using SPC or rollator for in home, RW for distances in community, denies falls ADLs Comments: pt reports ind with seated rest breaks     Extremity/Trunk Assessment   Upper Extremity Assessment Upper Extremity Assessment: Overall WFL for tasks assessed    Lower Extremity Assessment Lower Extremity Assessment: RLE deficits/detail RLE Deficits / Details: ankle WFL, knee able to perform quad set, SLR with static ~10 deg flexion RLE Sensation: WNL RLE Coordination: WNL    Cervical / Trunk Assessment Cervical / Trunk Assessment: Normal  Communication   Communication Communication: No apparent difficulties  Cognition Arousal: Alert Behavior During Therapy: WFL for tasks assessed/performed Overall Cognitive Status: Within Functional Limits for tasks assessed                                          General Comments General comments (skin integrity, edema, etc.): pt on RA with SpO2 89-95%    Exercises Total Joint Exercises Ankle Circles/Pumps: AROM, Both, 10 reps, Supine   Assessment/Plan    PT Assessment Patient needs continued PT services  PT Problem List Decreased strength;Decreased range of  motion;Decreased activity tolerance;Decreased balance;Pain       PT Treatment Interventions DME instruction;Gait training;Stair training;Functional mobility training;Therapeutic activities;Therapeutic exercise;Balance training;Neuromuscular re-education;Patient/family education;Modalities    PT Goals (Current goals can be found in the Care Plan section)  Acute Rehab PT Goals Patient Stated Goal: return home with sister to assist PT Goal Formulation: With patient Time For Goal Achievement: 08/22/23 Potential to Achieve Goals: Good    Frequency Min 1X/week     Co-evaluation               AM-PAC PT "6 Clicks" Mobility  Outcome Measure Help needed turning from your back to your side while in a flat bed without using bedrails?: None Help needed moving from lying on your back to sitting on the side of a flat bed without using bedrails?: None Help needed moving to and from a bed to a chair (including a wheelchair)?: A Little Help needed standing up from a chair using your arms (e.g., wheelchair or bedside chair)?: A Little Help needed to walk in hospital room?: A Little Help needed climbing 3-5 steps with a railing? : A Little 6 Click Score: 20    End of Session Equipment Utilized During Treatment: Gait belt Activity Tolerance: Patient tolerated treatment well Patient left: in chair;with call bell/phone within reach;with nursing/sitter in room Nurse Communication: Mobility status PT Visit Diagnosis: Muscle weakness (generalized) (M62.81);Pain;Other abnormalities of gait and mobility (  R26.89) Pain - Right/Left: Right Pain - part of body: Knee    Time: 1610-9604 PT Time Calculation (min) (ACUTE ONLY): 37 min   Charges:   PT Evaluation $PT Eval Low Complexity: 1 Low PT Treatments $Gait Training: 8-22 mins PT General Charges $$ ACUTE PT VISIT: 1 Visit         Tori Jensen [place] PT, DPT 08/08/23, 11:40 AM

## 2023-08-09 ENCOUNTER — Telehealth: Payer: Self-pay
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
## 2023-08-09 NOTE — Transitions of Care (Post Inpatient/ED Visit) (Signed)
08/09/2023  Name: [name] MRN: [id number] DOB: December 06, 1950  Today's TOC FU Call Status: Today's TOC FU Call Status:: Successful TOC FU Call Completed TOC FU Call Complete Date: 08/09/23 Patient's Name and Date of Birth confirmed.  Transition Care Management Follow-up Telephone Call Date of Discharge: 08/08/23 Discharge Facility: [facility]) Type of Discharge: Inpatient Admission Primary Inpatient Discharge Diagnosis:: "failed total knee arthoplasty" How have you been since you were released from the hospital?: Better ("Doing pretty good-slept well last night"-pain only about an 8/10 when having to get up & down often, trying to not take Oxycodone and just take Ultram and Robaxin, appetite starting to come back, no BM -taking Colace daily-will try other measures today) Any questions or concerns?: No  Items Reviewed: Did you receive and understand the discharge instructions provided?: Yes Medications obtained,verified, and reconciled?: Yes (Medications Reviewed) Any new allergies since your discharge?: No Dietary orders reviewed?: Yes Type of Diet Ordered:: low salt/heart healthy Do you have support at home?: Yes Name of Support/Comfort Primary Source: pt voices she has several family members assisting her while she recovers  Medications Reviewed Today: Medications Reviewed Today     Reviewed by Charlyn Minerva, RN (Registered Nurse) on 08/09/23 at 1006  Med List Status: <None>   Medication Order Taking? Sig Documenting Provider Last Dose Status Informant  acetaminophen (TYLENOL) 325 MG tablet [id number] Yes Take 650 mg by mouth every 6 (six) hours as needed for moderate pain (pain score 4-6). [provider] Taking Active Self  acetaZOLAMIDE (DIAMOX) 250 MG tablet [id number] Yes TAKE 1 TABLET (250 MG TOTAL) BY MOUTH DAILY. NEEDS FOLLOW UP APPOINTMENT FOR MORE REFILLS Laurey Morale, MD Taking Active Self  ADCIRCA 20 MG tablet [id number] Yes  TAKE 2 TABLETS BY MOUTH 1 TIME A DAY. [place], Anderson Malta, FNP Taking Active Self  allopurinol (ZYLOPRIM) 100 MG tablet [id number] Yes TAKE 1 TABLET BY MOUTH TWICE A DAY  Patient taking differently: Take 100 mg by mouth daily.   Worthy Rancher B, FNP Taking Active Self  atenolol (TENORMIN) 50 MG tablet [id number] Yes Take 1 tablet (50 mg total) by mouth daily. [place], Anderson Malta, FNP Taking Active Self  carboxymethylcellulose (REFRESH PLUS) 0.5 % SOLN [id number] Yes Place 1 drop into both eyes 3 (three) times daily as needed (dry eyes). [provider] Taking Active Self  Cholecalciferol (VITAMIN D) 50 MCG (2000 UT) tablet [id number] Yes Take 2,000 Units by mouth 3 (three) times a week. [provider] Taking Active Self  Cyanocobalamin (B-12) 5000 MCG CAPS [id number] Yes Take 5,000 Units by mouth 2 (two) times a week. [provider] Taking Active Self  docusate sodium (COLACE) 100 MG capsule [id number] Yes Take 100 mg by mouth 2 (two) times daily as needed for mild constipation. [provider] Taking Active Self  empagliflozin (JARDIANCE) 10 MG TABS tablet [id number] Yes Take 1 tablet (10 mg total) by mouth daily. [place], Anderson Malta, FNP Taking Active Self  Ferrous Sulfate (SLOW FE PO) [id number] Yes Take 1 tablet by mouth 2 (two) times a week. [provider] Taking Active Self  furosemide (LASIX) 20 MG tablet [id number] Yes Take 20-40 mg by mouth See admin instructions. Take 40 mg by mouth in the morning and take 20 mg in the PM [provider] Taking Active Self  levothyroxine (SYNTHROID) 75 MCG tablet [id number] Yes TAKE 1 TABLET BY MOUTH EVERY DAY Panosh, Neta Mends, MD Taking Active Self  methocarbamol (ROBAXIN) 500 MG tablet [id number]  Yes Take 1 tablet (500 mg total) by mouth every 6 (six) hours as needed for muscle spasms. Derenda Fennel, [place] Taking Active   NON FORMULARY [id number] Yes Pt uses a c-pap nightly [provider] Taking Active  Self  ondansetron (ZOFRAN) 4 MG tablet [id number] No Take 1 tablet (4 mg total) by mouth every 6 (six) hours as needed for nausea.  Patient not taking: Reported on 08/09/2023   Derenda Fennel, [place] Not Taking Active            Med Note Merrilee Seashore Aug 09, 2023 10:05 AM) Pt states she didn't get med filled from pharmacy-did not think it was needed or neccessary  oxyCODONE (OXY IR/ROXICODONE) 5 MG immediate release tablet [id number] Yes Take 1-2 tablets (5-10 mg total) by mouth every 6 (six) hours as needed for severe pain (pain score 7-10). [place], Lyn Hollingshead, PA Taking Active   potassium chloride (KLOR-CON) 10 MEQ tablet [id number] Yes Take 2 tablets (20 mEq total) by mouth daily.  Patient taking differently: Take 20 mEq by mouth 2 (two) times daily.   Laurey Morale, MD Taking Active Self  rivaroxaban (XARELTO) 20 MG TABS tablet [id number] Yes TAKE 1 TABLET (20 MG TOTAL) BY MOUTH DAILY WITH SUPPER. NEEDS FOLLOW UP APPOINTMENT FOR MORE REFILLS Laurey Morale, MD Taking Active Self  rosuvastatin (CRESTOR) 5 MG tablet [id number] Yes Take 1 tablet (5 mg total) by mouth daily. Jacklynn Ganong, [place] Taking Active Self    Discontinued 11/18/11 1032   traMADol (ULTRAM) 50 MG tablet [id number] Yes TAKE 1 TABLET BY MOUTH 3 TIMES DAILY AS NEEDED. Panosh, Neta Mends, MD Taking Active Self            Home Care and Equipment/Supplies: Were Home Health Services Ordered?: NA (pt has been set up with outpt therapy via [facility]-confirms she has appt tomorrow morning) Any new equipment or medical supplies ordered?: No  Functional Questionnaire: Do you need assistance with bathing/showering or dressing?: No Do you need assistance with meal preparation?: No Do you need assistance with eating?: No Do you have difficulty maintaining continence: No Do you need assistance with getting out of bed/getting out of a chair/moving?: No Do you have difficulty managing or taking your medications?:  No  Follow up appointments reviewed: PCP Follow-up appointment confirmed?: NA Specialist Hospital Follow-up appointment confirmed?: Yes Date of Specialist follow-up appointment?: 08/21/23 Follow-Up Specialty Provider:: Dr. Lequita Halt Do you need transportation to your follow-up appointment?: No (pt confirmed family able to get her to therapy appts and MD appts until she is able to resume driving) Do you understand care options if your condition(s) worsen?: Yes-patient verbalized understanding  SDOH Interventions Today    Flowsheet Row Most Recent Value  SDOH Interventions   Food Insecurity Interventions Intervention Not Indicated  Housing Interventions Intervention Not Indicated  Transportation Interventions Intervention Not Indicated  Utilities Interventions Intervention Not Indicated       TOC interventions discussed/reviewed: -Discussed/reviewed insurance/health plans benefits -Doctor visit discussed/reviewed -PCP -Doctor visits discussed/reviewed-Specialist -Provided Verbal Education: 30-day TOC program, nutrition, meds & their functions, symptom mgmt-pain & bowel., fall/safety measures in the home,Post discharge activity limitations per provider,Post op wound/incision care, S/S of infection   Antionette Fairy, RN,BSN,CCM RN Care Manager Transitions of Care  [facility]  Direct Phone: [phone] Toll Free: [phone] Fax: [phone]

## 2023-08-10 DIAGNOSIS — M25561 Pain in right knee: Secondary | ICD-10-CM | POA: Diagnosis not present

## 2023-08-10 DIAGNOSIS — M6281 Muscle weakness (generalized): Secondary | ICD-10-CM | POA: Diagnosis not present

## 2023-08-10 DIAGNOSIS — M25661 Stiffness of right knee, not elsewhere classified: Secondary | ICD-10-CM | POA: Diagnosis not present

## 2023-08-15 DIAGNOSIS — M6281 Muscle weakness (generalized): Secondary | ICD-10-CM | POA: Diagnosis not present

## 2023-08-15 DIAGNOSIS — M25561 Pain in right knee: Secondary | ICD-10-CM | POA: Diagnosis not present

## 2023-08-15 DIAGNOSIS — M25661 Stiffness of right knee, not elsewhere classified: Secondary | ICD-10-CM | POA: Diagnosis not present

## 2023-08-17 DIAGNOSIS — M6281 Muscle weakness (generalized): Secondary | ICD-10-CM | POA: Diagnosis not present

## 2023-08-17 DIAGNOSIS — M25661 Stiffness of right knee, not elsewhere classified: Secondary | ICD-10-CM | POA: Diagnosis not present

## 2023-08-17 DIAGNOSIS — M25561 Pain in right knee: Secondary | ICD-10-CM | POA: Diagnosis not present

## 2023-08-21 DIAGNOSIS — M25561 Pain in right knee: Secondary | ICD-10-CM | POA: Diagnosis not present

## 2023-08-21 DIAGNOSIS — M25661 Stiffness of right knee, not elsewhere classified: Secondary | ICD-10-CM | POA: Diagnosis not present

## 2023-08-21 DIAGNOSIS — M6281 Muscle weakness (generalized): Secondary | ICD-10-CM | POA: Diagnosis not present

## 2023-09-01 ENCOUNTER — Other Ambulatory Visit: Payer: Self-pay | Admitting: Family

## 2023-09-09 ENCOUNTER — Other Ambulatory Visit: Payer: Self-pay | Admitting: Internal Medicine

## 2023-09-12 ENCOUNTER — Other Ambulatory Visit: Payer: Self-pay | Admitting: Internal Medicine

## 2023-09-12 MED ORDER — ALLOPURINOL 100 MG PO TABS: 100.0000 mg | ORAL_TABLET | Freq: Two times a day (BID) | ORAL | 0 refills | Status: DC

## 2023-09-18 ENCOUNTER — Ambulatory Visit: Payer: PPO | Admitting: Pulmonary Disease

## 2023-10-03 DIAGNOSIS — Z96651 Presence of right artificial knee joint: Secondary | ICD-10-CM | POA: Diagnosis not present

## 2023-10-03 DIAGNOSIS — Z5189 Encounter for other specified aftercare: Secondary | ICD-10-CM | POA: Diagnosis not present

## 2023-10-06 NOTE — Progress Notes (Signed)
Date:  10/09/2023   ID:  Stacy, [name] [birth year], MRN [id number]   Provider location: [facility] Type of Visit: Established patient   PCP:  Madelin Headings, MD  Cardiologist:  Dr. Shirlee Latch  HPI: [name] is a 73 y.o. 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   Admitted 3/2 ->11/17/16 with A/C diastolic CHF and A/C respiratory failure. Pt initially refused Bipap so venti mask used. Eventually tolerated transition to BiPAP. Overall pt diuresed from 285 lbs down to 229 lbs with 38 L of diuresis.  Echo 11/07/16 EF 55-60%, PASP 90mm Hg, mildly dilated RV with moderate to severely decreased RV systolic function. RHC/LHC in 3/18 showed no coronary disease and confirmed severe PH.    Echo in 7/20 showed EF 60-65%, mild LVH, normal RV, PASP 38 mmHg, mild AS. Echo in 8/21 showed EF 60-65%, mildly decreased RV function, severe biatrial enlargement, small PFO, unable to estimate PA systolic pressure.   Echo 1/23, EF 60-65%, mild LVH, normal RV, mild RV enlargement, severe biatrial enlargement, mild MR, no significant aortic stenosis.   Echo 10/24 EF 60-65%, normal RV, severe biatrial enlargement, mild MR, suspect PFO, PASP 27 mmHg.   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   Today she returns for HF follow up. Overall feeling fine. Mild SOB walking further distance on flat ground. Mild ankle swelling at night, improves by morning. Avoids stairs with leg weakness, knee pain much improved after surgery. Denies palpitations, abnormal bleeding, CP, dizziness, or PND/Orthopnea. Appetite ok. No fever or chills. Weight at home 223 pounds. Taking all medications. She uses CPAP at night, no longer requiring oxygen during the day. Planning hernia surgery soon  ECG (personally reviewed): none ordered  today.   Labs (5/23): K 5.1, creatinine 1.28 Labs (9/24): K 3.6, creatinine 1.37, LDL 63, TGs 100, TSH normal Labs (11/24): K 3.9, creatinine 1.15   PMH 1. 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 - RHC/LHC 11/07/16: No angiographic CAD; PA 90/55, LVEDP 23 (PCWP inaccurate), CI 2.15 Fick/2.32 Thermo, shunt run negative, PVR 5.7 WU.  - Echo (7/20): EF 60-65%, mild LVH, normal RV, PASP 38 mmHg, mild AS.  - Echo (8/21): EF 60-65%, mildly decreased RV function, severe biatrial enlargement, small PFO, unable to estimate PA systolic pressure. - Echo (1/23): EF 60-65%, mild LVH, normal RV, mild RV enlargement, severe biatrial enlargement, mild MR, no significant aortic stenosis.  - Echo (10/24): EF 60-65%, normal RV, severe biatrial enlargement, mild MR, suspect PFO, PASP 27 mmHg.  2. Chronic hypercarbic/hypoxic respiratory failure with OHS/OSA. Sees Dr. Kendrick Fries. Using nightly BiPAP and oxygen by nasal cannula during the day.  3. Atrial fibrillation: Chronic - Rate controlled on Xarelto + atenolol  4. 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  PVR 5.7 WU on RHC 3/18.  Suspect group 2 (elevated LA pressure) and group 3 (OHS/OSA) PH. However, cannot rule out group 1 component.  She had V/Q scan that was not suggestive of chronic PE and high resolution chest CT that was not suggestive of ILD.  ANA, RF, anti-SCL70 all negative.   5. H/o CVA 6. Hypothyroidism 7. H/o TKR  8. Aortic stenosis: Mild on 7/20 echo. No evidence for significant stenosis on 1/23 echo.   Current Outpatient Medications  Medication Sig Dispense Refill   acetaminophen (TYLENOL) 325 MG tablet Take 650 mg by mouth every 6 (six) hours as needed for moderate pain (pain score 4-6).     acetaZOLAMIDE (DIAMOX) 250 MG tablet TAKE 1 TABLET (250  MG TOTAL) BY MOUTH DAILY. NEEDS FOLLOW UP APPOINTMENT FOR MORE REFILLS 90 tablet 1   ADCIRCA 20 MG tablet TAKE 2 TABLETS BY MOUTH 1 TIME A DAY. 60 tablet 11   allopurinol (ZYLOPRIM) 100 MG tablet Take 1 tablet (100 mg total) by mouth 2 (two) times daily. 180 tablet 0   atenolol (TENORMIN) 50 MG tablet Take 1 tablet (50 mg total) by mouth daily. 90 tablet 3   carboxymethylcellulose (REFRESH PLUS) 0.5 % SOLN Place 1 drop into both eyes 3 (three) times daily as needed (dry eyes).     Cholecalciferol (VITAMIN D) 50 MCG (2000 UT) tablet Take 2,000 Units by mouth 3 (three) times a week.     Cyanocobalamin (B-12) 5000 MCG CAPS Take 5,000 Units by mouth 2 (two) times a week.     empagliflozin (JARDIANCE) 10 MG TABS tablet Take 1 tablet (10 mg total) by mouth daily. 30 tablet 11   Ferrous Sulfate (SLOW FE PO) Take 1 tablet by mouth 2 (two) times a week.     furosemide (LASIX) 20 MG tablet Take 20-40 mg by mouth See admin instructions. Take 40 mg by mouth in the morning and take 20 mg in the PM     levothyroxine (SYNTHROID) 75 MCG tablet TAKE 1 TABLET BY MOUTH EVERY DAY 90 tablet 1   methocarbamol (ROBAXIN) 500 MG tablet Take 1 tablet (500 mg total) by mouth every 6 (six) hours as needed for muscle spasms. 40 tablet 0   NON FORMULARY Pt uses a c-pap nightly     potassium chloride (KLOR-CON) 10 MEQ tablet Take 2 tablets (20 mEq total) by mouth daily. (Patient taking differently: Take 20 mEq by mouth 2 (two) times daily.)     rivaroxaban (XARELTO) 20 MG TABS tablet TAKE 1 TABLET (20 MG TOTAL) BY MOUTH DAILY WITH SUPPER. NEEDS FOLLOW UP APPOINTMENT FOR MORE REFILLS 60 tablet 0   rosuvastatin (CRESTOR) 5 MG tablet Take 1 tablet (5 mg total) by mouth daily. 90 tablet 3   traMADol (ULTRAM) 50 MG tablet TAKE 1 TABLET BY MOUTH 3 TIMES DAILY AS NEEDED. 15 tablet 1   No current facility-administered medications for this encounter.    Allergies:   Ambien [zolpidem tartrate], Losartan, Prevacid [lansoprazole],  Adhesive [tape], Keflex [cephalexin], Motrin [ibuprofen], Prilosec [omeprazole], Oxycodone, Ace inhibitors, Benadryl [diphenhydramine hcl], and Spironolactone   Social History:  The patient  reports that she has never smoked. She has never used smokeless tobacco. She reports current alcohol use of about 1.0 standard drink of alcohol per week. She reports that she does not use drugs.   Family History:  The patient's family history includes COPD in her mother; Diabetes in her father; Esophageal cancer in her cousin; Heart attack in her father; Hypertension in her brother, father, mother, and sister; Liver cancer in her father; Osteoporosis in her mother; Stroke in her maternal grandmother.   ROS:  Please see the history of present illness.   All other systems are personally reviewed and negative.   Recent Labs: 05/30/2023: ALT 7; TSH 1.05 06/28/2023: B Natriuretic Peptide 289.0 08/08/2023: BUN 21; Creatinine, Ser 1.08; Hemoglobin 15.5;  Platelets 84; Potassium 4.4; Sodium 134  Personally reviewed   Wt Readings from Last 3 Encounters:  10/09/23 101.5 kg (223 lb 12.8 oz)  08/07/23 104 kg (229 lb 4.5 oz)  07/26/23 104.3 kg (230 lb)   BP 118/84   Pulse (!) 59   Wt 101.5 kg (223 lb 12.8 oz)   LMP 05/06/2013   SpO2 99%   BMI 43.71 kg/m   PHYSICAL EXAM: General:  NAD. No resp difficulty, walked into clinic with RW HEENT: Normal Neck: Supple. No JVD. Cor: Irregular rate & rhythm. No rubs, gallops or murmurs. Lungs: Clear Abdomen: Soft, obese, nontender, nondistended.  Extremities: No cyanosis, clubbing, rash, edema Neuro: Alert & oriented x 3, moves all 4 extremities w/o difficulty. Affect pleasant.  ASSESSMENT AND PLAN: 1. Chronic diastolic CHF:  Associated with severe pulmonary hypertension and prominent RV failure.  NYHA class II symptoms, Stable.  Echo 10/24 showed EF 60-65%, normal RV, severe biatrial enlargement, mild MR, suspect PFO, PASP 27 mmHg. NYHA II, she is not volume  overloaded. - Continue Lasix 40 qam/20 qpm + 20 KCL bid. BMET today - Continue Jardiance 10 mg daily. No GU symptoms - Finerenone stopped during most recent hospitalization. Check labs today, consider re-trialing 2. Chronic hypercarbic/hypoxic respiratory failure with OHS/OSA:  Using CPAP at night. She is not using oxygen during the day at this point, has lost weight. 3. Atrial fibrillation: Chronic, rate controlled.  - Continue atenolol 50 mg daily.    - Continue Xarelto for anticoagulation.  4. 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  PVR 5.7 WU by RHC in 3/18.  Suspect group 2 (elevated LA pressure) and group 3 (OHS/OSA) PH. However, cannot rule out group 1 component.  She had V/Q scan that was not suggestive of chronic PE and high resolution chest CT that was not suggestive of ILD.  ANA, RF, anti-SCL70 all negative.  She did not have a marked improvement from taking Adcirca => suspect predominantly group 2 and 3 PH.  Will hold off on additional pulmonary vasodilators. Echo 1/23 showed with mildly enlarged RV with normal systolic function, unable to estimate PA systolic pressure. Echo 10/24 showed normal RV with PASP estimated 27 mmHg (improved).  - Continue Lasix as above. BNP today. - Continue Adcirca 40 mg daily.  - Continue CPAP at night for OSA, no longer requiring oxygen during the day.  5. HTN: BP well controlled - Continue current medications.  6. Obesity: Body mass index is 43.71 kg/m. - Insurance would not cover semaglutide. 7. Pre-operative risk evaluation: She will need umbilical hernia repair. Most recent showed improved RV function and normal PA pressure estimate.  She no longer requires oxygen during the day.   I think she would be of acceptable risk for surgery from a cardiac standpoint.  OK to hold Xarelto 2 days prior to surgery.  Follow up in 4 months with Dr. Shirlee Latch  Signed, Jacklynn Ganong, FNP  10/09/2023  [facility] [place] [address] [facility] [place] [address] [phone] (office) [phone] (fax)

## 2023-10-09 ENCOUNTER — Encounter (HOSPITAL_COMMUNITY): Payer: Self-pay

## 2023-10-09 ENCOUNTER — Ambulatory Visit (HOSPITAL_COMMUNITY)
Admission: RE | Admit: 2023-10-09 | Discharge: 2023-10-09 | Disposition: A | Discharge status: Home / Self Care | Payer: PPO | Source: Ambulatory Visit | Attending: Family Medicine

## 2023-10-09 VITALS — BP 118/84 | HR 59 | Wt 223.8 lb

## 2023-10-09 DIAGNOSIS — I272 Pulmonary hypertension, unspecified: Secondary | ICD-10-CM

## 2023-10-09 DIAGNOSIS — J9610 Chronic respiratory failure, unspecified whether with hypoxia or hypercapnia: Secondary | ICD-10-CM

## 2023-10-09 DIAGNOSIS — I10 Essential (primary) hypertension: Secondary | ICD-10-CM | POA: Diagnosis not present

## 2023-10-09 DIAGNOSIS — Z7901 Long term (current) use of anticoagulants: Secondary | ICD-10-CM | POA: Insufficient documentation

## 2023-10-09 DIAGNOSIS — J9611 Chronic respiratory failure with hypoxia: Secondary | ICD-10-CM | POA: Insufficient documentation

## 2023-10-09 DIAGNOSIS — G4733 Obstructive sleep apnea (adult) (pediatric): Secondary | ICD-10-CM | POA: Insufficient documentation

## 2023-10-09 DIAGNOSIS — I2721 Secondary pulmonary arterial hypertension: Secondary | ICD-10-CM | POA: Diagnosis not present

## 2023-10-09 DIAGNOSIS — Z79899 Other long term (current) drug therapy: Secondary | ICD-10-CM | POA: Insufficient documentation

## 2023-10-09 DIAGNOSIS — I110 Hypertensive heart disease with heart failure: Secondary | ICD-10-CM | POA: Insufficient documentation

## 2023-10-09 DIAGNOSIS — I482 Chronic atrial fibrillation, unspecified: Secondary | ICD-10-CM

## 2023-10-09 DIAGNOSIS — J9612 Chronic respiratory failure with hypercapnia: Secondary | ICD-10-CM | POA: Diagnosis not present

## 2023-10-09 DIAGNOSIS — Z01818 Encounter for other preprocedural examination: Secondary | ICD-10-CM

## 2023-10-09 DIAGNOSIS — Z8673 Personal history of transient ischemic attack (TIA), and cerebral infarction without residual deficits: Secondary | ICD-10-CM | POA: Diagnosis not present

## 2023-10-09 DIAGNOSIS — Z6841 Body Mass Index (BMI) 40.0 and over, adult: Secondary | ICD-10-CM | POA: Diagnosis not present

## 2023-10-09 DIAGNOSIS — Z8249 Family history of ischemic heart disease and other diseases of the circulatory system: Secondary | ICD-10-CM | POA: Diagnosis not present

## 2023-10-09 DIAGNOSIS — I5032 Chronic diastolic (congestive) heart failure: Secondary | ICD-10-CM | POA: Diagnosis not present

## 2023-10-09 DIAGNOSIS — Z7984 Long term (current) use of oral hypoglycemic drugs: Secondary | ICD-10-CM | POA: Insufficient documentation

## 2023-10-09 LAB — BASIC METABOLIC PANEL
Anion gap: 12 (ref 5–15)
BUN: 18 mg/dL (ref 8–23)
CO2: 24 mmol/L (ref 22–32)
Calcium: 9.3 mg/dL (ref 8.9–10.3)
Chloride: 106 mmol/L (ref 98–111)
Creatinine, Ser: 1.24 mg/dL — ABNORMAL HIGH (ref 0.44–1.00)
GFR, Estimated: 46 mL/min — ABNORMAL LOW (ref >60)
Glucose, Bld: 112 mg/dL — ABNORMAL HIGH (ref 70–99)
Potassium: 3.6 mmol/L (ref 3.5–5.1)
Sodium: 142 mmol/L (ref 135–145)

## 2023-10-09 LAB — BRAIN NATRIURETIC PEPTIDE: B Natriuretic Peptide: 218.9 pg/mL — ABNORMAL HIGH (ref 0.0–100.0)

## 2023-10-09 NOTE — Patient Instructions (Signed)
Thank you for coming in today  If you had labs drawn today, any labs that are abnormal the clinic will call you No news is good news  Medications: No changes   Follow up appointments:  Your physician recommends that you schedule a follow-up appointment in:  4 months With Dr. Shirlee Latch    Do the following things EVERYDAY: Weigh yourself in the morning before breakfast. Write it down and keep it in a log. Take your medicines as prescribed Eat low salt foods--Limit salt (sodium) to 2000 mg per day.  Stay as active as you can everyday Limit all fluids for the day to less than 2 liters   At the [facility], you and your health needs are our priority. As part of our continuing mission to provide you with exceptional heart care, we have created designated Provider Care Teams. These Care Teams include your primary Cardiologist (physician) and Advanced Practice Providers (APPs- Physician Assistants and Nurse Practitioners) who all work together to provide you with the care you need, when you need it.   You may see any of the following providers on your designated Care Team at your next follow up: Dr Arvilla Meres Dr Marca Ancona Dr. Marcos Eke, NP Robbie Lis, [place] [facility] [place], [place] Brynda Peon, NP Karle Plumber, PharmD   Please be sure to bring in all your medications bottles to every appointment.    Thank you for choosing [facility]  If you have any questions or concerns before your next appointment please send [place] a message through [place] or call our office at [phone].    TO LEAVE A MESSAGE FOR THE NURSE SELECT OPTION 2, PLEASE LEAVE A MESSAGE INCLUDING: YOUR NAME DATE OF BIRTH CALL BACK NUMBER REASON FOR CALL**this is important as we prioritize the call backs  YOU WILL RECEIVE A CALL BACK THE SAME DAY AS LONG AS YOU CALL BEFORE 4:00 PM

## 2023-10-17 ENCOUNTER — Encounter: Payer: Self-pay | Admitting: Internal Medicine

## 2023-10-17 ENCOUNTER — Ambulatory Visit (INDEPENDENT_AMBULATORY_CARE_PROVIDER_SITE_OTHER): Payer: PPO | Admitting: Internal Medicine

## 2023-10-17 ENCOUNTER — Other Ambulatory Visit: Payer: Self-pay | Admitting: Internal Medicine

## 2023-10-17 VITALS — BP 119/60 | HR 52 | Temp 98.2°F | Ht 60.0 in | Wt 224.4 lb

## 2023-10-17 DIAGNOSIS — Z0000 Encounter for general adult medical examination without abnormal findings: Secondary | ICD-10-CM | POA: Diagnosis not present

## 2023-10-17 DIAGNOSIS — Z79899 Other long term (current) drug therapy: Secondary | ICD-10-CM | POA: Diagnosis not present

## 2023-10-17 DIAGNOSIS — I5032 Chronic diastolic (congestive) heart failure: Secondary | ICD-10-CM

## 2023-10-17 DIAGNOSIS — D696 Thrombocytopenia, unspecified: Secondary | ICD-10-CM

## 2023-10-17 DIAGNOSIS — R7303 Prediabetes: Secondary | ICD-10-CM

## 2023-10-17 DIAGNOSIS — Z01818 Encounter for other preprocedural examination: Secondary | ICD-10-CM

## 2023-10-17 DIAGNOSIS — E039 Hypothyroidism, unspecified: Secondary | ICD-10-CM

## 2023-10-17 DIAGNOSIS — K429 Umbilical hernia without obstruction or gangrene: Secondary | ICD-10-CM | POA: Diagnosis not present

## 2023-10-17 NOTE — Progress Notes (Signed)
Future orders placed

## 2023-10-17 NOTE — Patient Instructions (Addendum)
Good to see y ou today .  Medically ok for surgery . Not sure why platelet count was down during surgery in December but medication could do this.such as allopurinol  Need  check if bleeding or  petechia   otherwise  pre operatively . Le me know after  seeing surgeon and any plans for labs .  To be sure to get cbc diff plts   Otherwise   we can  order from our end.   Ask to stop the allopurinol for now and plan fu cbc diff plt in 1-2 mos  or if done for preop surgery.
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
## 2023-10-24 ENCOUNTER — Other Ambulatory Visit (HOSPITAL_COMMUNITY): Payer: Self-pay | Admitting: Cardiology

## 2023-11-06 ENCOUNTER — Other Ambulatory Visit: Payer: Self-pay | Admitting: Surgery

## 2023-11-06 DIAGNOSIS — K429 Umbilical hernia without obstruction or gangrene: Secondary | ICD-10-CM | POA: Diagnosis not present

## 2023-11-07 ENCOUNTER — Ambulatory Visit: Payer: PPO | Admitting: Adult Health

## 2023-11-07 ENCOUNTER — Encounter: Payer: Self-pay | Admitting: Adult Health

## 2023-11-07 VITALS — BP 120/66 | HR 53 | Ht 60.0 in | Wt 226.4 lb

## 2023-11-07 DIAGNOSIS — J9611 Chronic respiratory failure with hypoxia: Secondary | ICD-10-CM

## 2023-11-07 DIAGNOSIS — Z01818 Encounter for other preprocedural examination: Secondary | ICD-10-CM

## 2023-11-07 DIAGNOSIS — G4733 Obstructive sleep apnea (adult) (pediatric): Secondary | ICD-10-CM | POA: Diagnosis not present

## 2023-11-07 DIAGNOSIS — I272 Pulmonary hypertension, unspecified: Secondary | ICD-10-CM

## 2023-11-07 DIAGNOSIS — I5032 Chronic diastolic (congestive) heart failure: Secondary | ICD-10-CM

## 2023-11-07 NOTE — Assessment & Plan Note (Signed)
 Excellent control and compliance on nocturnal CPAP.  Continue on current settings.  Plan  Patient Instructions  Continue on CPAP  At bedtime  Continue on Oxygen 2l/m with activity As needed  , goal is for Oxygen level >88-90%  Work on healthy weight  Do not drive if sleepy.  Good luck with upcoming surgery.  Follow up with Dr Melida Quitter Clent Ridges NP in 6 months.  Please contact office for sooner follow up if symptoms do not improve or worsen or seek emergency care

## 2023-11-07 NOTE — Assessment & Plan Note (Signed)
 Pulmonary preop clearance-patient is a moderate to high surgical risk given her age and underlying multiple comorbidities including OSA/OHS with pulmonary hypertension and diastolic heart failure.  She is currently stable and at baseline.  Has excellent control and compliance on CPAP.  O2 saturations have been adequate on room air.  Not currently using oxygen.  She remains independent.  From a pulmonary standpoint she is not excluded  from surgery with the following precautions:   Major Pulmonary risks identified in the multifactorial risk analysis are but not limited to a) pneumonia; b) recurrent intubation risk; c) prolonged or recurrent acute respiratory failure needing mechanical ventilation; d) prolonged hospitalization; e) DVT/Pulmonary embolism; f) Acute Pulmonary edema  Recommend 1. Short duration of surgery as much as possible and avoid paralytic if possible 2. Recovery in step down or ICU with Pulmonary consultation if indicated 3.  Anticoagulated therapy per protocol patient is on Xarelto. 4. Aggressive pulmonary toilet with o2, bronchodilatation, and incentive spirometry and early ambulation CPAP in the postop setting as needed O2 to keep O2 saturations greater than 88 to 90%.

## 2023-11-07 NOTE — Patient Instructions (Addendum)
 Continue on CPAP  At bedtime  Continue on Oxygen 2l/m with activity As needed  , goal is for Oxygen level >88-90%  Work on healthy weight  Do not drive if sleepy.  Good luck with upcoming surgery.  Follow up with Dr Melida Quitter Clent Ridges NP in 6 months.  Please contact office for sooner follow up if symptoms do not improve or worsen or seek emergency care

## 2023-11-07 NOTE — Assessment & Plan Note (Signed)
 Patient rarely uses her oxygen.  Has a POC device to use at home for heavy exercise.  No increased oxygen demands.  Continue to maintain O2 saturations greater than 88 to 90%.  Use oxygen if drops below 88%.

## 2023-11-07 NOTE — Assessment & Plan Note (Signed)
 Pulmonary hypertension-suspected group 2/group 3-continue on current regimen from cardiology.  Continue to keep O2 saturations greater than 88 to 9%.  Continue on diuretics.  Continue on nocturnal CPAP.
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
## 2023-11-07 NOTE — Assessment & Plan Note (Signed)
Appears euvolemic on exam.  Continue follow-up with cardiology and current maintenance regimen 

## 2023-11-08 ENCOUNTER — Telehealth: Payer: Self-pay | Admitting: Adult Health

## 2023-11-08 NOTE — Telephone Encounter (Signed)
 Fax received from Dr. Loretha Brasil with [facility] to perform a hernia repair surgery on patient.  Patient needs surgery clearance. Surgery is pending. Patient was seen on 11/08/23. Office protocol is a risk assessment can be sent to surgeon if patient has been seen in 60 days or less.   Risk assessment was done at this visit. I have faxed note to [employer] @ [facility] [phone].

## 2023-11-20 ENCOUNTER — Ambulatory Visit: Payer: PPO | Admitting: Pulmonary Disease

## 2023-11-22 NOTE — Progress Notes (Signed)
 Surgical Instructions   Your procedure is scheduled on November 30, 2023. Report to [facility] Main Entrance "A" at 8:30 A.M., then check in with the Admitting office. Any questions or running late day of surgery: call [phone]  Questions prior to your surgery date: call [phone], Monday-Friday, 8am-4pm. If you experience any cold or flu symptoms such as cough, fever, chills, shortness of breath, etc. between now and your scheduled surgery, please notify us at the above number.     Remember:  Do not eat after midnight the night before your surgery   You may drink clear liquids until 7:30 the morning of your surgery.   Clear liquids allowed are: Water, Non-Citrus Juices (without pulp), Carbonated Beverages, Clear Tea (no milk, honey, etc.), Black Coffee Only (NO MILK, CREAM OR POWDERED CREAMER of any kind), and Gatorade.    Take these medicines the morning of surgery with A SIP OF WATER  ADCIRCA  atenolol (TENORMIN)  levothyroxine (SYNTHROID)  rosuvastatin (CRESTOR)    May take these medicines IF NEEDED: acetaminophen (TYLENOL)  carboxymethylcellulose (REFRESH PLUS)  traMADol (ULTRAM    Please contact surgeon regarding directions for xarelto. Last dose of empagliflozin (JARDIANCE) 11-26-23  One week prior to surgery, STOP taking any Aspirin (unless otherwise instructed by your surgeon) Aleve, Naproxen, Ibuprofen, Motrin, Advil, Goody's, BC's, all herbal medications, fish oil, and non-prescription vitamins.                     Do NOT Smoke (Tobacco/Vaping) for 24 hours prior to your procedure.  If you use a CPAP at night, you may bring your mask/headgear for your overnight stay.   You will be asked to remove any contacts, glasses, piercing's, hearing aid's, dentures/partials prior to surgery. Please bring cases for these items if needed.    Patients discharged the day of surgery will not be allowed to drive home, and someone needs to stay with them for 24  hours.  SURGICAL WAITING ROOM VISITATION Patients may have no more than 2 support people in the waiting area - these visitors may rotate.   Pre-op nurse will coordinate an appropriate time for 1 ADULT support person, who may not rotate, to accompany patient in pre-op.  Children under the age of 26 must have an adult with them who is not the patient and must remain in the main waiting area with an adult.  If the patient needs to stay at the hospital during part of their recovery, the visitor guidelines for inpatient rooms apply.  Please refer to the [facility] website for the visitor guidelines for any additional information.   If you received a COVID test during your pre-op visit  it is requested that you wear a mask when out in public, stay away from anyone that may not be feeling well and notify your surgeon if you develop symptoms. If you have been in contact with anyone that has tested positive in the last 10 days please notify you surgeon.      Pre-operative CHG Bathing Instructions   You can play a key role in reducing the risk of infection after surgery. Your skin needs to be as free of germs as possible. You can reduce the number of germs on your skin by washing with CHG (chlorhexidine gluconate) soap before surgery. CHG is an antiseptic soap that kills germs and continues to kill germs even after washing.   DO NOT use if you have an allergy to chlorhexidine/CHG or antibacterial soaps. If your  skin becomes reddened or irritated, stop using the CHG and notify one of our RNs at [phone].              TAKE A SHOWER THE NIGHT BEFORE SURGERY AND THE DAY OF SURGERY    Please keep in mind the following:  DO NOT shave, including legs and underarms, 48 hours prior to surgery.   You may shave your face before/day of surgery.  Place clean sheets on your bed the night before surgery Use a clean washcloth (not used since being washed) for each shower. DO NOT sleep with pet's night  before surgery.  CHG Shower Instructions:  Wash your face and private area with normal soap. If you choose to wash your hair, wash first with your normal shampoo.  After you use shampoo/soap, rinse your hair and body thoroughly to remove shampoo/soap residue.  Turn the water OFF and apply half the bottle of CHG soap to a CLEAN washcloth.  Apply CHG soap ONLY FROM YOUR NECK DOWN TO YOUR TOES (washing for 3-5 minutes)  DO NOT use CHG soap on face, private areas, open wounds, or sores.  Pay special attention to the area where your surgery is being performed.  If you are having back surgery, having someone wash your back for you may be helpful. Wait 2 minutes after CHG soap is applied, then you may rinse off the CHG soap.  Pat dry with a clean towel  Put on clean pajamas    Additional instructions for the day of surgery: DO NOT APPLY any lotions, deodorants, cologne, or perfumes.   Do not wear jewelry or makeup Do not wear nail polish, gel polish, artificial nails, or any other type of covering on natural nails (fingers and toes) Do not bring valuables to the hospital. [facility] is not responsible for valuables/personal belongings. Put on clean/comfortable clothes.  Please brush your teeth.  Ask your nurse before applying any prescription medications to the skin.

## 2023-11-23 ENCOUNTER — Encounter (HOSPITAL_COMMUNITY): Payer: Self-pay

## 2023-11-23 ENCOUNTER — Encounter (HOSPITAL_COMMUNITY)
Admission: RE | Admit: 2023-11-23 | Discharge: 2023-11-23 | Disposition: A | Discharge status: Home / Self Care | Source: Ambulatory Visit | Attending: Surgery | Admitting: Surgery

## 2023-11-23 ENCOUNTER — Other Ambulatory Visit: Payer: Self-pay

## 2023-11-23 VITALS — BP 136/52 | HR 60 | Temp 98.4°F | Resp 17 | Ht 60.0 in | Wt 226.0 lb

## 2023-11-23 DIAGNOSIS — J9612 Chronic respiratory failure with hypercapnia: Secondary | ICD-10-CM | POA: Diagnosis not present

## 2023-11-23 DIAGNOSIS — J9611 Chronic respiratory failure with hypoxia: Secondary | ICD-10-CM | POA: Insufficient documentation

## 2023-11-23 DIAGNOSIS — Z7901 Long term (current) use of anticoagulants: Secondary | ICD-10-CM | POA: Diagnosis not present

## 2023-11-23 DIAGNOSIS — I340 Nonrheumatic mitral (valve) insufficiency: Secondary | ICD-10-CM | POA: Insufficient documentation

## 2023-11-23 DIAGNOSIS — G629 Polyneuropathy, unspecified: Secondary | ICD-10-CM | POA: Insufficient documentation

## 2023-11-23 DIAGNOSIS — G4733 Obstructive sleep apnea (adult) (pediatric): Secondary | ICD-10-CM | POA: Insufficient documentation

## 2023-11-23 DIAGNOSIS — I5030 Unspecified diastolic (congestive) heart failure: Secondary | ICD-10-CM | POA: Diagnosis not present

## 2023-11-23 DIAGNOSIS — Z01812 Encounter for preprocedural laboratory examination: Secondary | ICD-10-CM | POA: Insufficient documentation

## 2023-11-23 DIAGNOSIS — I272 Pulmonary hypertension, unspecified: Secondary | ICD-10-CM | POA: Insufficient documentation

## 2023-11-23 DIAGNOSIS — I482 Chronic atrial fibrillation, unspecified: Secondary | ICD-10-CM | POA: Insufficient documentation

## 2023-11-23 DIAGNOSIS — Z8673 Personal history of transient ischemic attack (TIA), and cerebral infarction without residual deficits: Secondary | ICD-10-CM | POA: Insufficient documentation

## 2023-11-23 DIAGNOSIS — Z01818 Encounter for other preprocedural examination: Secondary | ICD-10-CM

## 2023-11-23 LAB — BASIC METABOLIC PANEL
Anion gap: 9 (ref 5–15)
BUN: 21 mg/dL (ref 8–23)
CO2: 25 mmol/L (ref 22–32)
Calcium: 9.3 mg/dL (ref 8.9–10.3)
Chloride: 108 mmol/L (ref 98–111)
Creatinine, Ser: 1.26 mg/dL — ABNORMAL HIGH (ref 0.44–1.00)
GFR, Estimated: 45 mL/min — ABNORMAL LOW (ref >60)
Glucose, Bld: 91 mg/dL (ref 70–99)
Potassium: 3.9 mmol/L (ref 3.5–5.1)
Sodium: 142 mmol/L (ref 135–145)

## 2023-11-23 LAB — CBC
HCT: 45.2 % (ref 36.0–46.0)
Hemoglobin: 14.3 g/dL (ref 12.0–15.0)
MCH: 32.7 pg (ref 26.0–34.0)
MCHC: 31.6 g/dL (ref 30.0–36.0)
MCV: 103.4 fL — ABNORMAL HIGH (ref 80.0–100.0)
Platelets: 119 10*3/uL — ABNORMAL LOW (ref 150–400)
RBC: 4.37 MIL/uL (ref 3.87–5.11)
RDW: 13.7 % (ref 11.5–15.5)
WBC: 5.8 10*3/uL (ref 4.0–10.5)
nRBC: 0 % (ref 0.0–0.2)

## 2023-11-23 NOTE — Progress Notes (Signed)
 PCP - Burna Mortimer Panosh,MD Cardiologist - Fransico Meadow  PPM/ICD - denies Device Orders -  Rep Notified -   Chest x-ray -03/12/23 EKG - 06/28/23 Stress Test - 08/17/16 ECHO - 06/28/23 Cardiac Cath - 11/07/16  Sleep Study - 02/09/2018 CPAP - yes-uses it every night  Fasting Blood Sugar - na Checks Blood Sugar _____ times a day  Last dose of GLP1 agonist-  na GLP1 instructions:   Blood Thinner Instructions:per cardiology -hold Xarelto 2 days- last dose 11/27/23. Aspirin Instructions:na  ERAS Protcol -clears until 0730 PRE-SURGERY Ensure or G2- Ensure  COVID TEST- na   Anesthesia review: yes-Hx OSA on CPAP, pulmonary hypertension,heart failure, afib on Xarelto  Patient denies shortness of breath, fever, cough and chest pain at PAT appointment   All instructions explained to the patient, with a verbal understanding of the material. Patient agrees to go over the instructions while at home for a better understanding. Patient also instructed to wear a mask when out in public prior to surgery. The opportunity to ask questions was provided.

## 2023-11-23 NOTE — Progress Notes (Addendum)
 Surgical Instructions   Your procedure is scheduled on November 30, 2023. Report to [facility] Main Entrance "A" at 8:30 A.M., then check in with the Admitting office. Any questions or running late day of surgery: call [phone]  Questions prior to your surgery date: call [phone], Monday-Friday, 8am-4pm. If you experience any cold or flu symptoms such as cough, fever, chills, shortness of breath, etc. between now and your scheduled surgery, please notify us at the above number.     Remember:  Do not eat after midnight the night before your surgery   You may drink clear liquids until 7:30 the morning of your surgery.   Clear liquids allowed are: Water, Non-Citrus Juices (without pulp), Carbonated Beverages, Clear Tea (no milk, honey, etc.), Black Coffee Only (NO MILK, CREAM OR POWDERED CREAMER of any kind), and Gatorade.  Patient Instructions  The night before surgery:  No food after midnight. ONLY clear liquids after midnight  The day of surgery (if you do NOT have diabetes):  Drink ONE (1) Pre-Surgery Clear Ensure by 7:30am the morning of surgery. Drink in one sitting. Do not sip.  This drink was given to you during your hospital  pre-op appointment visit.  Nothing else to drink after completing the  Pre-Surgery Clear Ensure.          If you have questions, please contact your surgeon's office.   Take these medicines the morning of surgery with A SIP OF WATER  ADCIRCA  atenolol (TENORMIN)  levothyroxine (SYNTHROID)  rosuvastatin (CRESTOR)    May take these medicines IF NEEDED: acetaminophen (TYLENOL)  carboxymethylcellulose (REFRESH PLUS)  traMADol (ULTRAM   According to your cardiologist note,you should hold Xarelto 2 days prior to surgery. Your last dose will be 11/27/23.   Hold Jardiance 72 hours prior to surgery.  Last dose of empagliflozin (JARDIANCE) 11-26-23  One week prior to surgery, STOP taking any Aspirin (unless otherwise instructed by your surgeon)  Aleve, Naproxen, Ibuprofen, Motrin, Advil, Goody's, BC's, all herbal medications, fish oil, and non-prescription vitamins.                     Do NOT Smoke (Tobacco/Vaping) for 24 hours prior to your procedure.  If you use a CPAP at night, you may bring your mask/headgear for your overnight stay.   You will be asked to remove any contacts, glasses, piercing's, hearing aid's, dentures/partials prior to surgery. Please bring cases for these items if needed.    Patients discharged the day of surgery will not be allowed to drive home, and someone needs to stay with them for 24 hours.  SURGICAL WAITING ROOM VISITATION Patients may have no more than 2 support people in the waiting area - these visitors may rotate.   Pre-op nurse will coordinate an appropriate time for 1 ADULT support person, who may not rotate, to accompany patient in pre-op.  Children under the age of 59 must have an adult with them who is not the patient and must remain in the main waiting area with an adult.  If the patient needs to stay at the hospital during part of their recovery, the visitor guidelines for inpatient rooms apply.  Please refer to the [facility] website for the visitor guidelines for any additional information.   If you received a COVID test during your pre-op visit  it is requested that you wear a mask when out in public, stay away from anyone that may not be feeling well and notify your surgeon  if you develop symptoms. If you have been in contact with anyone that has tested positive in the last 10 days please notify you surgeon.      Pre-operative CHG Bathing Instructions   You can play a key role in reducing the risk of infection after surgery. Your skin needs to be as free of germs as possible. You can reduce the number of germs on your skin by washing with CHG (chlorhexidine gluconate) soap before surgery. CHG is an antiseptic soap that kills germs and continues to kill germs even after washing.    DO NOT use if you have an allergy to chlorhexidine/CHG or antibacterial soaps. If your skin becomes reddened or irritated, stop using the CHG and notify one of our RNs at [phone].              TAKE A SHOWER THE NIGHT BEFORE SURGERY AND THE DAY OF SURGERY    Please keep in mind the following:  DO NOT shave, including legs and underarms, 48 hours prior to surgery.   You may shave your face before/day of surgery.  Place clean sheets on your bed the night before surgery Use a clean washcloth (not used since being washed) for each shower. DO NOT sleep with pet's night before surgery.  CHG Shower Instructions:  Wash your face and private area with normal soap. If you choose to wash your hair, wash first with your normal shampoo.  After you use shampoo/soap, rinse your hair and body thoroughly to remove shampoo/soap residue.  Turn the water OFF and apply half the bottle of CHG soap to a CLEAN washcloth.  Apply CHG soap ONLY FROM YOUR NECK DOWN TO YOUR TOES (washing for 3-5 minutes)  DO NOT use CHG soap on face, private areas, open wounds, or sores.  Pay special attention to the area where your surgery is being performed.  If you are having back surgery, having someone wash your back for you may be helpful. Wait 2 minutes after CHG soap is applied, then you may rinse off the CHG soap.  Pat dry with a clean towel  Put on clean pajamas    Additional instructions for the day of surgery: DO NOT APPLY any lotions, deodorants, cologne, or perfumes.   Do not wear jewelry or makeup Do not wear nail polish, gel polish, artificial nails, or any other type of covering on natural nails (fingers and toes) Do not bring valuables to the hospital. [facility] is not responsible for valuables/personal belongings. Put on clean/comfortable clothes.  Please brush your teeth.  Ask your nurse before applying any prescription medications to the skin.

## 2023-11-24 NOTE — Progress Notes (Signed)
 Anesthesia Chart Review:  72 year old female follows with cardiology for history of HFpEF, pulmonary hypertension (previously severe with impaired RV function; RV function normalized with PASP 27 mmHg on echo 10/24), chronic A-fib on Xarelto, CVA 2012, OHS/OSA.  Angiographically normal coronaries on cath 2018.  Echo 10/24 EF 60-65%, normal RV, severe biatrial enlargement, mild MR, suspect PFO, PASP 27 mmHg.  Last seen in the advanced heart failure clinic by Prince Rome, FNP on 10/09/2023.  Stable at that time, euvolemic, no changes made to management.  Upcoming surgery was discussed.  Per note, "She will need umbilical hernia repair. Most recent showed improved RV function and normal PA pressure estimate.  She no longer requires oxygen during the day.   I think she would be of acceptable risk for surgery from a cardiac standpoint.  OK to hold Xarelto 2 days prior to surgery."  Follows with pulmonology for history of chronic hypercarbic/hypoxic respiratory failure with OHS/OSA.  Using nightly CPAP and oxygen by nasal cannula as needed during the day (patient reports only rarely using this).  Last seen by Rubye Oaks, NP on 11/07/2023.  Per note, excellent control and compliance on nocturnal CPAP.  Upcoming surgery was discussed.  Per note, "Pulmonary preop clearance-patient is a moderate to high surgical risk given her age and underlying multiple comorbidities including OSA/OHS with pulmonary hypertension and diastolic heart failure.  She is currently stable and at baseline.  Has excellent control and compliance on CPAP.  O2 saturations have been adequate on room air.  Not currently using oxygen.  She remains independent.  From a pulmonary standpoint she is not excluded  from surgery with the following precautions: Major Pulmonary risks identified in the multifactorial risk analysis are but not limited to a) pneumonia; b) recurrent intubation risk; c) prolonged or recurrent acute respiratory failure needing  mechanical ventilation; d) prolonged hospitalization; e) DVT/Pulmonary embolism; f) Acute Pulmonary edema. Recommend 1. Short duration of surgery as much as possible and avoid paralytic if possible 2. Recovery in step down or ICU with Pulmonary consultation if indicated 3.  Anticoagulated therapy per protocol patient is on Xarelto. 4. Aggressive pulmonary toilet with o2, bronchodilatation, and incentive spirometry and early ambulation CPAP in the postop setting as needed O2 to keep O2 saturations greater than 88 to 90%."  Patient reports last dose of Xarelto 11/27/2023.  Other pertinent history includes hypothyroid, neuropathy, laryngeal pharyngeal reflux.  Preop labs reviewed, creatinine mildly elevated 1.26, mild thrombocytopenia platelets 119, otherwise unremarkable.  EKG 06/28/2023: Atrial fibrillation with slow ventricular response with premature ventricular or aberrantly conducted complexes.  Rate 56.  Right axis deviation. Cannot rule out Anterior infarct , age undetermined  TTE 06/28/2023: 1. Left ventricular ejection fraction, by estimation, is 55 to 60%. The  left ventricle has normal function. The left ventricle has no regional  wall motion abnormalities. Left ventricular diastolic function could not  be evaluated.   2. Right ventricular systolic function is normal. The right ventricular  size is normal. There is normal pulmonary artery systolic pressure. The  estimated right ventricular systolic pressure is 22.5 mmHg.   3. Left atrial size was severely dilated.   4. Right atrial size was severely dilated.   5. The mitral valve is degenerative. Mild mitral valve regurgitation. No  evidence of mitral stenosis.   6. The aortic valve is tricuspid. Aortic valve regurgitation is not  visualized. Aortic valve sclerosis/calcification is present, without any  evidence of aortic stenosis.   7. The inferior vena cava is normal  in size with greater than 50%  respiratory variability,  suggesting right atrial pressure of 3 mmHg.   8. Cannot exclude a small PFO. Recommend limited ech with agitated saline  contrast study.      Zannie Cove [facility]/Anesthesiology Phone [phone] 11/24/2023 4:13 PM

## 2023-11-24 NOTE — Anesthesia Preprocedure Evaluation (Addendum)
 Anesthesia Evaluation  Patient identified by MRN, date of birth, ID band Patient awake    Reviewed: Allergy & Precautions, NPO status , Patient's Chart, lab work & pertinent test results  Airway Mallampati: III  TM Distance: >3 FB Neck ROM: Full    Dental no notable dental hx.    Pulmonary sleep apnea and Continuous Positive Airway Pressure Ventilation    Pulmonary exam normal        Cardiovascular hypertension, +CHF  Normal cardiovascular exam+ dysrhythmias Atrial Fibrillation   Pulmonary hypertension   Neuro/Psych  Neuromuscular disease CVA, No Residual Symptoms  negative psych ROS   GI/Hepatic negative GI ROS, Neg liver ROS,,,  Endo/Other  Hypothyroidism  Class 3 obesity  Renal/GU negative Renal ROS     Musculoskeletal  (+) Arthritis ,  Ambulates with cane and walker   Abdominal  (+) + obese  Peds  Hematology negative hematology ROS (+)   Anesthesia Other Findings UMBILICAL HERNIA  Reproductive/Obstetrics                             Anesthesia Physical Anesthesia Plan  ASA: 3  Anesthesia Plan: General   Post-op Pain Management:    Induction: Intravenous  PONV Risk Score and Plan: 3 and Ondansetron, Dexamethasone and Treatment may vary due to age or medical condition  Airway Management Planned: LMA  Additional Equipment:   Intra-op Plan:   Post-operative Plan: Extubation in OR  Informed Consent: I have reviewed the patients History and Physical, chart, labs and discussed the procedure including the risks, benefits and alternatives for the proposed anesthesia with the patient or authorized representative who has indicated his/her understanding and acceptance.     Dental advisory given  Plan Discussed with: CRNA  Anesthesia Plan Comments: (PAT note by Antionette Poles, PA-C: 73 year old female follows with cardiology for history of HFpEF, pulmonary hypertension (previously  severe with impaired RV function; RV function normalized with PASP 27 mmHg on echo 10/24), chronic A-fib on Xarelto, CVA 2012, OHS/OSA.  Angiographically normal coronaries on cath 2018.  Echo 10/24 EF 60-65%, normal RV, severe biatrial enlargement, mild MR, suspect PFO, PASP 27 mmHg.  Last seen in the advanced heart failure clinic by Prince Rome, FNP on 10/09/2023.  Stable at that time, euvolemic, no changes made to management.  Upcoming surgery was discussed.  Per note, "She will need umbilical hernia repair. Most recent showed improved RV function and normal PA pressure estimate.  She no longer requires oxygen during the day.   I think she would be of acceptable risk for surgery from a cardiac standpoint.  OK to hold Xarelto 2 days prior to surgery."  Follows with pulmonology for history of chronic hypercarbic/hypoxic respiratory failure with OHS/OSA.  Using nightly CPAP and oxygen by nasal cannula as needed during the day (patient reports only rarely using this).  Last seen by Rubye Oaks, NP on 11/07/2023.  Per note, excellent control and compliance on nocturnal CPAP.  Upcoming surgery was discussed.  Per note, "Pulmonary preop clearance-patient is a moderate to high surgical risk given her age and underlying multiple comorbidities including OSA/OHS with pulmonary hypertension and diastolic heart failure.  She is currently stable and at baseline.  Has excellent control and compliance on CPAP.  O2 saturations have been adequate on room air.  Not currently using oxygen.  She remains independent.  From a pulmonary standpoint she is not excluded  from surgery with the following precautions: Major Pulmonary risks  identified in the multifactorial risk analysis are but not limited to a) pneumonia; b) recurrent intubation risk; c) prolonged or recurrent acute respiratory failure needing mechanical ventilation; d) prolonged hospitalization; e) DVT/Pulmonary embolism; f) Acute Pulmonary edema. Recommend 1. Short  duration of surgery as much as possible and avoid paralytic if possible 2. Recovery in step down or ICU with Pulmonary consultation if indicated 3.  Anticoagulated therapy per protocol patient is on Xarelto. 4. Aggressive pulmonary toilet with o2, bronchodilatation, and incentive spirometry and early ambulation CPAP in the postop setting as needed O2 to keep O2 saturations greater than 88 to 90%."  Patient reports last dose of Xarelto 11/27/2023.  Other pertinent history includes hypothyroid, neuropathy, laryngeal pharyngeal reflux.  Preop labs reviewed, creatinine mildly elevated 1.26, mild thrombocytopenia platelets 119, otherwise unremarkable.  EKG 06/28/2023: Atrial fibrillation with slow ventricular response with premature ventricular or aberrantly conducted complexes.  Rate 56.  Right axis deviation. Cannot rule out Anterior infarct , age undetermined  TTE 06/28/2023: 1. Left ventricular ejection fraction, by estimation, is 55 to 60%. The  left ventricle has normal function. The left ventricle has no regional  wall motion abnormalities. Left ventricular diastolic function could not  be evaluated.  2. Right ventricular systolic function is normal. The right ventricular  size is normal. There is normal pulmonary artery systolic pressure. The  estimated right ventricular systolic pressure is 22.5 mmHg.  3. Left atrial size was severely dilated.  4. Right atrial size was severely dilated.  5. The mitral valve is degenerative. Mild mitral valve regurgitation. No  evidence of mitral stenosis.  6. The aortic valve is tricuspid. Aortic valve regurgitation is not  visualized. Aortic valve sclerosis/calcification is present, without any  evidence of aortic stenosis.  7. The inferior vena cava is normal in size with greater than 50%  respiratory variability, suggesting right atrial pressure of 3 mmHg.  8. Cannot exclude a small PFO. Recommend limited ech with agitated saline  contrast  study.  )        Anesthesia Quick Evaluation

## 2023-11-26 ENCOUNTER — Other Ambulatory Visit (HOSPITAL_COMMUNITY): Payer: Self-pay | Admitting: Cardiology

## 2023-11-28 DIAGNOSIS — J9610 Chronic respiratory failure, unspecified whether with hypoxia or hypercapnia: Secondary | ICD-10-CM | POA: Diagnosis not present

## 2023-11-28 DIAGNOSIS — G4733 Obstructive sleep apnea (adult) (pediatric): Secondary | ICD-10-CM | POA: Diagnosis not present

## 2023-11-29 NOTE — H&P (Signed)
 PROVIDER: Wayne Both, MD  MRN: [id number] DOB: 1950-10-30 DATE OF ENCOUNTER: 11/06/2023 Subjective   Chief Complaint: Umbilical hernia   History of Present Illness: [name] is a 73 y.o. female who is seen today for a long-term follow-up regarding her umbilical hernia. She saw Dr. Donell Beers last year for the umbilical hernia as well as potential gallbladder issues with gallstones. At that time, secondary to cardiopulmonary issues she was deemed a fairly high risk for surgery and she decided to hold on any surgery. At that point she was having minimal symptoms from the gallbladder. Since that time she has had a knee revision replacement in December and did very well. She has seen cardiology again and they have now cleared her for surgery and says she is an acceptable risk. She reports she has had no gallbladder issues and her only discomfort is from the umbilical hernia. She will have some sharp discomfort right at the umbilicus when the hernia protrudes but no obstructive symptoms    Review of Systems: A complete review of systems was obtained from the patient. I have reviewed this information and discussed as appropriate with the patient. See HPI as well for other ROS.  ROS   Medical History: Past Medical History:  Diagnosis Date  Arthritis  CHF (congestive heart failure) (CMS/HHS-HCC)  Chronic atrial fibrillation (CMS/HHS-HCC)  GERD (gastroesophageal reflux disease)  History of stroke  Hyperlipidemia  Sleep apnea  Thyroid disease   Patient Active Problem List  Diagnosis  Symptomatic cholelithiasis  Balance problem  Chronic anticoagulation  Chronic atrial fibrillation (CMS/HHS-HCC)  Chronic diastolic (congestive) heart failure (CMS/HHS-HCC)  History of stroke  OSA (obstructive sleep apnea)  Pulmonary hypertension (CMS/HHS-HCC)   Past Surgical History:  Procedure Laterality Date  DILATATION AND CURETTAGE /HYSTEROSCOPY 06/13/2011  Dr. Paul Half  Reverse  TOTAL SHOULDER ARTHROPLASTY (Left) Left 04/29/2016  Dr. Dietrich Pates  Right shoulder reverse total revision Right 07/13/2020  Dr. Dietrich Pates  JOINT REPLACEMENT    Allergies  Allergen Reactions  Lansoprazole Swelling  Losartan Other (See Comments)  Elevated creatinine and swelling  Zolpidem Tartrate Other (See Comments)  Amnesia and fall  Adhesive Tape-Silicones Other (See Comments)  Adhesive tape and band aids " irritate, " causes blisters and pulls skin when removing. Okay to use paper tape  Cephalexin Swelling  Swelling in ankles, feet  Ibuprofen Other (See Comments)  ANKLE EDEMA  Omeprazole Swelling  Swelling in ankles.  Ace Inhibitors Cough  Diphenhydramine Hcl Other (See Comments)  topical  Spironolactone Rash   Current Outpatient Medications on File Prior to Visit  Medication Sig Dispense Refill  acetaZOLAMIDE (DIAMOX) 250 MG tablet Take by mouth  atenoloL (TENORMIN) 50 MG tablet Take by mouth  cholecalciferol (VITAMIN D3) 5,000 unit capsule Take by mouth  diclofenac (VOLTAREN) 1 % topical gel Apply topically  empagliflozin (JARDIANCE) 10 mg tablet Take 1 tablet by mouth once daily  FUROsemide (LASIX) 20 MG tablet Take by mouth  levothyroxine (SYNTHROID) 75 MCG tablet Take 1 tablet by mouth once daily  potassium chloride (KLOR-CON) 10 MEQ ER tablet Take 2 tablets by mouth 2 (two) times daily  rivaroxaban (XARELTO) 20 mg tablet Take by mouth  rosuvastatin (CRESTOR) 5 MG tablet Take 5 mg by mouth once daily  tadalafil (ADCIRCA) 20 mg tablet TAKE 2 TABLETS BY MOUTH 1 TIME A DAY.  traMADoL (ULTRAM) 50 mg tablet Take 50 mg by mouth 3 (three) times daily as needed  allopurinoL (ZYLOPRIM) 100 MG tablet Take 1 tablet  by mouth 2 (two) times daily (Patient not taking: Reported on 11/06/2023)   No current facility-administered medications on file prior to visit.   Family History  Problem Relation Age of Onset  High blood pressure (Hypertension) Mother  High blood pressure  (Hypertension) Father  Diabetes Father  Liver cancer Father  Myocardial Infarction (Heart attack) Father  High blood pressure (Hypertension) Sister  High blood pressure (Hypertension) Brother  Stroke Maternal Grandmother    Social History   Tobacco Use  Smoking Status Never  Smokeless Tobacco Never    Social History   Socioeconomic History  Marital status: Divorced  Tobacco Use  Smoking status: Never  Smokeless tobacco: Never  Vaping Use  Vaping status: Never Used  Substance and Sexual Activity  Alcohol use: Not Currently  Drug use: Never   Social Drivers of Health   Financial Resource Strain: Medium Risk (08/04/2022)  Received [facility]  Overall Financial Resource Strain (CARDIA)  Difficulty of Paying Living Expenses: Somewhat hard  Food Insecurity: No Food Insecurity (08/09/2023)  Received from [facility]  Hunger Vital Sign  Worried About Running Out of Food in the Last Year: Never true  Ran Out of Food in the Last Year: Never true  Transportation Needs: No Transportation Needs (08/09/2023)  Received from [facility] - Transportation  Lack of Transportation (Medical): No  Lack of Transportation (Non-Medical): No  Physical Activity: Insufficiently Active (05/26/2023)  Received from [facility]  Exercise Vital Sign  Days of Exercise per Week: 3 days  Minutes of Exercise per Session: 20 min  Stress: No Stress Concern Present (05/26/2023)  Received from [facility] - Occupational Stress Questionnaire  Feeling of Stress : Not at all  Social Connections: Moderately Integrated (05/26/2023)  Received from [facility]  Social Connection and Isolation Panel [NHANES]  Frequency of Communication with Friends and Family: More than three times a week  Frequency of Social Gatherings with Friends and Family: More than three times a week  Attends Religious Services: More than 4 times per year  Active Member of [employer] or  Organizations: Yes  Attends Banker Meetings: More than 4 times per year  Marital Status: Divorced  Housing Stability: Unknown (11/06/2023)  Housing Stability Vital Sign  Homeless in the Last Year: No   Objective:   Vitals:  11/06/23 1348  BP: (!) 144/84  Pulse: 51  Temp: 36.3 C (97.3 F)  SpO2: 94%  Weight: (!) 102.2 kg (225 lb 3.2 oz)  Height: 152.4 cm (5')  PainSc: 0-No pain   Body mass index is 43.98 kg/m.  Physical Exam   She appears well on exam  She is morbidly obese. She is currently walking with a walker secondary to the recent knee surgery.  There is an easily reducible hernia above the umbilicus. The fascial defect feels to be about 1.5 cm. It is mildly tender.  Labs, Imaging and Diagnostic Testing:  I reviewed her notes in the electronic medical records  Assessment and Plan:   Diagnoses and all orders for this visit:  Umbilical hernia without obstruction and without gangrene  1.5 cm fascial defect  At this point, we again discussed abdominal anatomy. She is interested in having the umbilical hernia repaired but wants to hold on any cholecystectomy as she feels that she no longer has symptoms and may not have had symptoms from the gallstones. She has been cleared by cardiology for surgery and sees pulmonology tomorrow. I would  recommend an open umbilical hernia repair with mesh with hopefully an LMA anesthesia without paralytics if anesthesiology is okay with that. Because of her medical issues I would observe her overnight after surgery. I explained the surgical procedure in detail. We discussed the risks which include but is not limited to bleeding, infection, injury to surrounding structures, significant cardiopulmonary issues, DVT, etc. She will stop her Xarelto 2 days preoperatively. She understands and agrees with the plans

## 2023-11-30 ENCOUNTER — Other Ambulatory Visit: Payer: Self-pay

## 2023-11-30 ENCOUNTER — Encounter (HOSPITAL_COMMUNITY)
Admission: RE | Disposition: A | Discharge status: Home / Self Care | Payer: Self-pay | Source: Ambulatory Visit | Attending: Surgery

## 2023-11-30 ENCOUNTER — Observation Stay (HOSPITAL_COMMUNITY): Payer: Self-pay | Admitting: Physician Assistant

## 2023-11-30 ENCOUNTER — Encounter (HOSPITAL_COMMUNITY): Payer: Self-pay | Admitting: Surgery

## 2023-11-30 ENCOUNTER — Observation Stay (HOSPITAL_BASED_OUTPATIENT_CLINIC_OR_DEPARTMENT_OTHER)

## 2023-11-30 ENCOUNTER — Observation Stay (HOSPITAL_COMMUNITY)
Admission: RE | Admit: 2023-11-30 | Discharge: 2023-12-01 | Disposition: A | Discharge status: Home / Self Care | Source: Ambulatory Visit | Attending: Surgery | Admitting: Surgery

## 2023-11-30 DIAGNOSIS — I482 Chronic atrial fibrillation, unspecified: Secondary | ICD-10-CM | POA: Diagnosis not present

## 2023-11-30 DIAGNOSIS — I110 Hypertensive heart disease with heart failure: Secondary | ICD-10-CM

## 2023-11-30 DIAGNOSIS — E039 Hypothyroidism, unspecified: Secondary | ICD-10-CM | POA: Diagnosis not present

## 2023-11-30 DIAGNOSIS — G4733 Obstructive sleep apnea (adult) (pediatric): Secondary | ICD-10-CM | POA: Diagnosis not present

## 2023-11-30 DIAGNOSIS — Z7901 Long term (current) use of anticoagulants: Secondary | ICD-10-CM | POA: Insufficient documentation

## 2023-11-30 DIAGNOSIS — K429 Umbilical hernia without obstruction or gangrene: Secondary | ICD-10-CM | POA: Diagnosis not present

## 2023-11-30 DIAGNOSIS — Z79899 Other long term (current) drug therapy: Secondary | ICD-10-CM | POA: Diagnosis not present

## 2023-11-30 DIAGNOSIS — Z96612 Presence of left artificial shoulder joint: Secondary | ICD-10-CM | POA: Insufficient documentation

## 2023-11-30 DIAGNOSIS — Z8673 Personal history of transient ischemic attack (TIA), and cerebral infarction without residual deficits: Secondary | ICD-10-CM | POA: Diagnosis not present

## 2023-11-30 DIAGNOSIS — I5032 Chronic diastolic (congestive) heart failure: Secondary | ICD-10-CM | POA: Diagnosis not present

## 2023-11-30 HISTORY — PX: UMBILICAL HERNIA REPAIR: SHX196

## 2023-11-30 SURGERY — REPAIR, HERNIA, UMBILICAL, ADULT
Anesthesia: General

## 2023-11-30 MED ORDER — AMISULPRIDE (ANTIEMETIC) 5 MG/2ML IV SOLN: 10.0000 mg | Freq: Once | INTRAVENOUS | Status: DC | PRN

## 2023-11-30 MED ORDER — ENSURE PRE-SURGERY PO LIQD: 296.0000 mL | Freq: Once | ORAL | Status: DC

## 2023-11-30 MED ORDER — CHLORHEXIDINE GLUCONATE 0.12 % MT SOLN: 15.0000 mL | Freq: Once | OROMUCOSAL | Status: AC

## 2023-11-30 MED ORDER — FENTANYL CITRATE (PF) 100 MCG/2ML IJ SOLN: 25.0000 ug | INTRAMUSCULAR | Status: DC | PRN

## 2023-11-30 MED ORDER — LEVOTHYROXINE SODIUM 75 MCG PO TABS
75.0000 ug | ORAL_TABLET | Freq: Every day | ORAL | Status: DC
  Administered 2023-12-01: 75 ug via ORAL
  Filled 2023-11-30: qty 1

## 2023-11-30 MED ORDER — DEXAMETHASONE SODIUM PHOSPHATE 10 MG/ML IJ SOLN
INTRAMUSCULAR | Status: AC
  Filled 2023-11-30: qty 1

## 2023-11-30 MED ORDER — 0.9 % SODIUM CHLORIDE (POUR BTL) OPTIME
TOPICAL | Status: DC | PRN
  Administered 2023-11-30: 1000 mL

## 2023-11-30 MED ORDER — POTASSIUM CHLORIDE ER 10 MEQ PO TBCR
20.0000 meq | EXTENDED_RELEASE_TABLET | Freq: Every day | ORAL | Status: DC
  Filled 2023-11-30 (×2): qty 2

## 2023-11-30 MED ORDER — FUROSEMIDE 40 MG PO TABS: 40.0000 mg | ORAL_TABLET | Freq: Every day | ORAL | Status: DC

## 2023-11-30 MED ORDER — ACETAMINOPHEN 500 MG PO TABS
ORAL_TABLET | ORAL | Status: AC
Start: 2023-11-30 — End: 2023-11-30
  Administered 2023-11-30: 1000 mg via ORAL
  Filled 2023-11-30: qty 2

## 2023-11-30 MED ORDER — ONDANSETRON 4 MG PO TBDP: 4.0000 mg | ORAL_TABLET | Freq: Four times a day (QID) | ORAL | Status: DC | PRN

## 2023-11-30 MED ORDER — CEFAZOLIN SODIUM-DEXTROSE 2-4 GM/100ML-% IV SOLN
INTRAVENOUS | Status: AC
  Filled 2023-11-30: qty 100

## 2023-11-30 MED ORDER — ACETAMINOPHEN 500 MG PO TABS: 1000.0000 mg | ORAL_TABLET | ORAL | Status: AC

## 2023-11-30 MED ORDER — CHLORHEXIDINE GLUCONATE 0.12 % MT SOLN
OROMUCOSAL | Status: AC
  Administered 2023-11-30: 15 mL via OROMUCOSAL
  Filled 2023-11-30: qty 15

## 2023-11-30 MED ORDER — TRAMADOL HCL 50 MG PO TABS: 50.0000 mg | ORAL_TABLET | Freq: Four times a day (QID) | ORAL | Status: DC | PRN

## 2023-11-30 MED ORDER — ONDANSETRON HCL 4 MG/2ML IJ SOLN
INTRAMUSCULAR | Status: DC | PRN
  Administered 2023-11-30: 4 mg via INTRAVENOUS

## 2023-11-30 MED ORDER — ATENOLOL 25 MG PO TABS: 50.0000 mg | ORAL_TABLET | Freq: Every day | ORAL | Status: DC

## 2023-11-30 MED ORDER — CIPROFLOXACIN IN D5W 400 MG/200ML IV SOLN
INTRAVENOUS | Status: AC
  Filled 2023-11-30: qty 200

## 2023-11-30 MED ORDER — BUPIVACAINE-EPINEPHRINE 0.25% -1:200000 IJ SOLN
INTRAMUSCULAR | Status: DC | PRN
  Administered 2023-11-30: 20 mL

## 2023-11-30 MED ORDER — ONDANSETRON HCL 4 MG/2ML IJ SOLN
INTRAMUSCULAR | Status: AC
  Filled 2023-11-30: qty 2

## 2023-11-30 MED ORDER — BUPIVACAINE-EPINEPHRINE (PF) 0.25% -1:200000 IJ SOLN
INTRAMUSCULAR | Status: AC
  Filled 2023-11-30: qty 30

## 2023-11-30 MED ORDER — CHLORHEXIDINE GLUCONATE CLOTH 2 % EX PADS: 6.0000 | MEDICATED_PAD | Freq: Once | CUTANEOUS | Status: DC

## 2023-11-30 MED ORDER — FENTANYL CITRATE (PF) 250 MCG/5ML IJ SOLN
INTRAMUSCULAR | Status: AC
  Filled 2023-11-30: qty 5

## 2023-11-30 MED ORDER — PROPOFOL 10 MG/ML IV BOLUS
INTRAVENOUS | Status: DC | PRN
  Administered 2023-11-30: 150 mg via INTRAVENOUS

## 2023-11-30 MED ORDER — ONDANSETRON HCL 4 MG/2ML IJ SOLN: 4.0000 mg | Freq: Four times a day (QID) | INTRAMUSCULAR | Status: DC | PRN

## 2023-11-30 MED ORDER — ACETAMINOPHEN 500 MG PO TABS
1000.0000 mg | ORAL_TABLET | Freq: Four times a day (QID) | ORAL | Status: DC
  Administered 2023-11-30 – 2023-12-01 (×4): 1000 mg via ORAL
  Filled 2023-11-30 (×4): qty 2

## 2023-11-30 MED ORDER — HYDROMORPHONE HCL 1 MG/ML IJ SOLN: 1.0000 mg | INTRAMUSCULAR | Status: DC | PRN

## 2023-11-30 MED ORDER — LIDOCAINE 2% (20 MG/ML) 5 ML SYRINGE
INTRAMUSCULAR | Status: DC | PRN
  Administered 2023-11-30: 60 mg via INTRAVENOUS

## 2023-11-30 MED ORDER — METHOCARBAMOL 500 MG PO TABS: 500.0000 mg | ORAL_TABLET | Freq: Four times a day (QID) | ORAL | Status: DC | PRN

## 2023-11-30 MED ORDER — RIVAROXABAN 10 MG PO TABS
20.0000 mg | ORAL_TABLET | Freq: Every day | ORAL | Status: DC
  Administered 2023-11-30: 20 mg via ORAL
  Filled 2023-11-30: qty 2

## 2023-11-30 MED ORDER — PROPOFOL 10 MG/ML IV BOLUS
INTRAVENOUS | Status: AC
  Filled 2023-11-30: qty 20

## 2023-11-30 MED ORDER — LIDOCAINE 2% (20 MG/ML) 5 ML SYRINGE
INTRAMUSCULAR | Status: AC
  Filled 2023-11-30: qty 5

## 2023-11-30 MED ORDER — ACETAZOLAMIDE 250 MG PO TABS
250.0000 mg | ORAL_TABLET | Freq: Every day | ORAL | Status: DC
  Filled 2023-11-30 (×2): qty 1

## 2023-11-30 MED ORDER — LACTATED RINGERS IV SOLN: INTRAVENOUS | Status: DC | PRN

## 2023-11-30 MED ORDER — FENTANYL CITRATE (PF) 250 MCG/5ML IJ SOLN
INTRAMUSCULAR | Status: DC | PRN
  Administered 2023-11-30 (×2): 50 ug via INTRAVENOUS

## 2023-11-30 MED ORDER — DEXAMETHASONE SODIUM PHOSPHATE 10 MG/ML IJ SOLN
INTRAMUSCULAR | Status: DC | PRN
  Administered 2023-11-30: 5 mg via INTRAVENOUS

## 2023-11-30 MED ORDER — CIPROFLOXACIN IN D5W 400 MG/200ML IV SOLN
400.0000 mg | INTRAVENOUS | Status: AC
  Administered 2023-11-30: 400 mg via INTRAVENOUS

## 2023-11-30 MED ORDER — TADALAFIL 20 MG PO TABS
20.0000 mg | ORAL_TABLET | Freq: Every day | ORAL | Status: DC
  Filled 2023-11-30: qty 1

## 2023-11-30 MED ORDER — CHLORHEXIDINE GLUCONATE CLOTH 2 % EX PADS
6.0000 | MEDICATED_PAD | Freq: Once | CUTANEOUS | Status: DC
Start: 2023-11-30 — End: 2023-11-30

## 2023-11-30 MED ORDER — ROSUVASTATIN CALCIUM 5 MG PO TABS: 5.0000 mg | ORAL_TABLET | Freq: Every day | ORAL | Status: DC

## 2023-11-30 MED ORDER — HYDROCODONE-ACETAMINOPHEN 5-325 MG PO TABS: 1.0000 | ORAL_TABLET | ORAL | Status: DC | PRN

## 2023-11-30 MED ORDER — ONDANSETRON HCL 4 MG/2ML IJ SOLN: 4.0000 mg | Freq: Once | INTRAMUSCULAR | Status: DC | PRN

## 2023-11-30 MED ORDER — FUROSEMIDE 40 MG PO TABS: 20.0000 mg | ORAL_TABLET | ORAL | Status: DC

## 2023-11-30 MED ORDER — FUROSEMIDE 40 MG PO TABS: 20.0000 mg | ORAL_TABLET | Freq: Every evening | ORAL | Status: DC

## 2023-11-30 MED ORDER — ORAL CARE MOUTH RINSE: 15.0000 mL | Freq: Once | OROMUCOSAL | Status: AC

## 2023-11-30 MED ORDER — EPHEDRINE SULFATE-NACL 50-0.9 MG/10ML-% IV SOSY
PREFILLED_SYRINGE | INTRAVENOUS | Status: DC | PRN
  Administered 2023-11-30: 10 mg via INTRAVENOUS
  Administered 2023-11-30: 5 mg via INTRAVENOUS

## 2023-11-30 SURGICAL SUPPLY — 28 items
BAG COUNTER SPONGE SURGICOUNT (BAG) IMPLANT
BLADE CLIPPER SURG (BLADE) IMPLANT
CANISTER SUCT 3000ML PPV (MISCELLANEOUS) IMPLANT
CHLORAPREP W/TINT 26 (MISCELLANEOUS) ×1 IMPLANT
COVER SURGICAL LIGHT HANDLE (MISCELLANEOUS) ×1 IMPLANT
DERMABOND ADVANCED .7 DNX12 (GAUZE/BANDAGES/DRESSINGS) ×1 IMPLANT
DRAPE LAPAROTOMY 100X72 PEDS (DRAPES) ×1 IMPLANT
ELECT REM PT RETURN 9FT ADLT (ELECTROSURGICAL) IMPLANT
ELECTRODE REM PT RTRN 9FT ADLT (ELECTROSURGICAL) ×1 IMPLANT
GLOVE SURG SIGNA 7.5 PF LTX (GLOVE) ×1 IMPLANT
GOWN STRL REUS W/ TWL LRG LVL3 (GOWN DISPOSABLE) ×1 IMPLANT
GOWN STRL REUS W/ TWL XL LVL3 (GOWN DISPOSABLE) ×1 IMPLANT
KIT BASIN OR (CUSTOM PROCEDURE TRAY) ×1 IMPLANT
KIT TURNOVER KIT B (KITS) ×1 IMPLANT
MESH VENTRALEX ST 2.5 CRC MED (Mesh General) IMPLANT
NDL HYPO 25GX1X1/2 BEV (NEEDLE) ×1 IMPLANT
NEEDLE HYPO 25GX1X1/2 BEV (NEEDLE) ×1 IMPLANT
NS IRRIG 1000ML POUR BTL (IV SOLUTION) ×1 IMPLANT
PACK GENERAL/GYN (CUSTOM PROCEDURE TRAY) ×1 IMPLANT
PAD ARMBOARD POSITIONER FOAM (MISCELLANEOUS) ×1 IMPLANT
PENCIL SMOKE EVACUATOR (MISCELLANEOUS) ×1 IMPLANT
SPIKE FLUID TRANSFER (MISCELLANEOUS) ×1 IMPLANT
SUT MNCRL AB 4-0 PS2 18 (SUTURE) ×1 IMPLANT
SUT NOVA NAB DX-16 0-1 5-0 T12 (SUTURE) ×1 IMPLANT
SUT VIC AB 3-0 SH 27X BRD (SUTURE) ×1 IMPLANT
SYR CONTROL 10ML LL (SYRINGE) ×1 IMPLANT
TOWEL GREEN STERILE (TOWEL DISPOSABLE) ×1 IMPLANT
TOWEL GREEN STERILE FF (TOWEL DISPOSABLE) ×1 IMPLANT

## 2023-11-30 NOTE — Anesthesia Procedure Notes (Signed)
 Procedure Name: LMA Insertion Date/Time: 11/30/2023 10:01 AM  Performed by: April Holding, CRNAPre-anesthesia Checklist: Patient identified, Emergency Drugs available, Suction available and Patient being monitored Patient Re-evaluated:Patient Re-evaluated prior to induction Oxygen Delivery Method: Circle System Utilized Preoxygenation: Pre-oxygenation with 100% oxygen Induction Type: IV induction Ventilation: Mask ventilation without difficulty LMA: LMA inserted LMA Size: 4.0 Number of attempts: 1 Placement Confirmation: positive ETCO2 Tube secured with: Tape Dental Injury: Teeth and Oropharynx as per pre-operative assessment

## 2023-11-30 NOTE — Transfer of Care (Signed)
 Immediate Anesthesia Transfer of Care Note  Patient: [name]  Procedure(s) Performed: REPAIR, HERNIA, UMBILICAL, ADULT WITH VENTRALEX 6.4 CM MESH  Patient Location: PACU  Anesthesia Type:General  Level of Consciousness: awake, alert , and oriented  Airway & Oxygen Therapy: Patient Spontanous Breathing and Patient connected to face mask oxygen  Post-op Assessment: Report given to RN and Post -op Vital signs reviewed and stable  Post vital signs: Reviewed and stable  Last Vitals:  Vitals Value Taken Time  BP 120/59 11/30/23 1045  Temp    Pulse 60 11/30/23 1046  Resp    SpO2 100 % 11/30/23 1046  Vitals shown include unfiled device data.  Last Pain:  Vitals:   11/30/23 0906  TempSrc: Oral  PainSc:          Complications: No notable events documented.

## 2023-11-30 NOTE — Progress Notes (Addendum)
 Pt's HR 40-55 Afib. Pt is asymptomatic. Pt is placed on continuous cardiac monitoring Dr. Bradley Ferris is aware.

## 2023-11-30 NOTE — Interval H&P Note (Signed)
 History and Physical Interval Note:no change in H and P  11/30/2023 9:19 AM  [name]  has presented today for surgery, with the diagnosis of UMBILICAL HERNIA.  The various methods of treatment have been discussed with the patient and family. After consideration of risks, benefits and other options for treatment, the patient has consented to  Procedure(s): REPAIR, HERNIA, UMBILICAL, ADULT (N/A) as a surgical intervention.  The patient's history has been reviewed, patient examined, no change in status, stable for surgery.  I have reviewed the patient's chart and labs.  Questions were answered to the patient's satisfaction.     Abigail Miyamoto

## 2023-11-30 NOTE — Anesthesia Postprocedure Evaluation (Signed)
 Anesthesia Post Note  Patient: Caraline Forensic psychologist  Procedure(s) Performed: REPAIR, HERNIA, UMBILICAL, ADULT WITH VENTRALEX 6.4 CM MESH     Patient location during evaluation: PACU Anesthesia Type: General Level of consciousness: awake Pain management: pain level controlled Vital Signs Assessment: post-procedure vital signs reviewed and stable Respiratory status: spontaneous breathing, nonlabored ventilation and respiratory function stable Cardiovascular status: blood pressure returned to baseline and stable Postop Assessment: no apparent nausea or vomiting Anesthetic complications: no   No notable events documented.  Last Vitals:  Vitals:   11/30/23 1115 11/30/23 1133  BP: (!) 114/51 (!) 119/54  Pulse: (!) 59 (!) 53  Resp: 15   Temp: 36.6 C 36.7 C  SpO2: 94% 97%    Last Pain:  Vitals:   11/30/23 1229  TempSrc:   PainSc: 2                  Adaley Kiene P Cameran Ahmed

## 2023-11-30 NOTE — Op Note (Signed)
   [name] 11/30/2023    Pre-op Diagnosis: UMBILICAL HERNIA     Post-op Diagnosis: UMBILICAL HERNIA (2 CM FASCIAL DEFECT)  Procedure(s): UMBILICAL HERNIA REPAIR (2 CM FASCIAL DEFECT WITH MESH   Surgeon(s): Abigail Miyamoto, MD  Anesthesia: General  Staff:  Circulator: Samule Dry, RN; Theressa Stamps, RN Circulator Assistant: Sharla Kidney, RN  Estimated Blood Loss: Minimal  Findings: The patient was found to have a 2 cm fascial defect at the umbilicus which was repaired with a 6.4 cm round ventral Prolene patch from [facility]  Procedure: The patient was brought to the operating room and identified the correct patient.  She is placed upon the operating table general anesthesia was induced.  Her abdomen was prepped and draped in the usual sterile fashion.  I made a vertical incision above the umbilicus and taken down to the umbilicus with a scalpel.  I then dissected down through the subcutaneous tissue to the umbilical hernia.  I separated the overlying hernia sac from the umbilical skin with the cautery I then opened the sac and all contents of been reduced back to the abdominal cavity.  I then excised the sac at its base with the cautery.  I evaluated the underlying peritoneal surface and there were no attachments or adhesions to this.  The fascial defect was 2 cm in size.  I brought a 6.4 cm round ventral Prolene patch from [facility] onto the field.  I placed it through the fascial opening and then pulled up against the peritoneum with the stay ties.  I then sutured the mesh in place with interrupted #1 Novafil suture circumferentially.  I then cut the stay ties and closed the fascia over the top of the mesh with 2 separate figure-of-eight #1 Novafil sutures.  Wide coverage and good closure of the fascia.  To be achieved.  I anesthetized the fascia and subcutaneous tissue further with Marcaine.  I tacked the umbilical skin back in place with a 3-0 Vicryl suture.  I then  closed the incision with interrupted 3-0 Vicryl sutures and interrupted 4-0 Monocryl sutures.  Dermabond was then applied.  The patient tolerated the procedure well.  All the counts were correct at the end of the procedure.  The patient was then extubated in the operating room and taken in stable condition to the recovery room.          Abigail Miyamoto   Date: 11/30/2023  Time: 10:44 AM

## 2023-12-01 ENCOUNTER — Encounter (HOSPITAL_COMMUNITY): Payer: Self-pay | Admitting: Surgery

## 2023-12-01 DIAGNOSIS — K429 Umbilical hernia without obstruction or gangrene: Secondary | ICD-10-CM | POA: Diagnosis not present

## 2023-12-01 NOTE — Discharge Instructions (Signed)
 [facility]  UMBILICAL OR INGUINAL HERNIA REPAIR: POST OP INSTRUCTIONS  Always review your discharge instruction sheet given to you by the facility where your surgery was performed. IF YOU HAVE DISABILITY OR FAMILY LEAVE FORMS, YOU MUST BRING THEM TO THE OFFICE FOR PROCESSING.   DO NOT GIVE THEM TO YOUR DOCTOR.  1. A  prescription for pain medication may be given to you upon discharge.  Take your pain medication as prescribed, if needed.  If narcotic pain medicine is not needed, then you may take acetaminophen (Tylenol) or ibuprofen (Advil) as needed. 2. Take your usually prescribed medications unless otherwise directed. If you need a refill on your pain medication, please contact your pharmacy.  They will contact our office to request authorization. Prescriptions will not be filled after 5 pm or on week-ends. 3. You should follow a light diet the first 24 hours after arrival home, such as soup and crackers, etc.  Be sure to include lots of fluids daily.  Resume your normal diet the day after surgery. 4.Most patients will experience some swelling and bruising around the umbilicus or in the groin and scrotum.  Ice packs and reclining will help.  Swelling and bruising can take several days to resolve.  6. It is common to experience some constipation if taking pain medication after surgery.  Increasing fluid intake and taking a stool softener (such as Colace) will usually help or prevent this problem from occurring.  A mild laxative (Milk of Magnesia or Miralax) should be taken according to package directions if there are no bowel movements after 48 hours. 7. Unless discharge instructions indicate otherwise, you may remove your bandages 24-48 hours after surgery, and you may shower at that time.  You may have steri-strips (small skin tapes) in place directly over the incision.  These strips should be left on the skin for 7-10 days.  If your surgeon used skin glue on the  incision, you may shower in 24 hours.  The glue will flake off over the next 2-3 weeks.  Any sutures or staples will be removed at the office during your follow-up visit. 8. ACTIVITIES:  You may resume regular (light) daily activities beginning the next day--such as daily self-care, walking, climbing stairs--gradually increasing activities as tolerated.  You may have sexual intercourse when it is comfortable.  Refrain from any heavy lifting or straining until approved by your doctor.  a.You may drive when you are no longer taking prescription pain medication, you can comfortably wear a seatbelt, and you can safely maneuver your car and apply brakes. b.RETURN TO WORK:   _____________________________________________  9.You should see your doctor in the office for a follow-up appointment approximately 2-3 weeks after your surgery.  Make sure that you call for this appointment within a day or two after you arrive home to insure a convenient appointment time. 10.OTHER INSTRUCTIONS: you may shower starting today NO LIFTING MORE THAN 15 POUNDS FOR 4 WEEKS     _____________________________________  WHEN TO CALL YOUR DOCTOR: Fever over 101.0 Inability to urinate Nausea and/or vomiting Extreme swelling or bruising Continued bleeding from incision. Increased pain, redness, or drainage from the incision  The clinic staff is available to answer your questions during regular business hours.  Please don't hesitate to call and ask to speak to one of the nurses for clinical concerns.  If you have a medical emergency, go to the nearest emergency room or call 911.  A surgeon from [facility] is  always on call at the hospital   [address], [place], [place]  [address] ?  [address] [address], [place], [place]   [address] [phone] ? [phone] ? FAX [phone] Web site: [email or web address]

## 2023-12-01 NOTE — Discharge Summary (Signed)
 Physician Discharge Summary  Patient ID: [name] MRN: [id number] DOB/AGE: 1951/04/04 73 y.o.  Admit date: 11/30/2023 Discharge date: 12/01/2023  Admission Diagnoses:  Discharge Diagnoses:  Principal Problem:   Umbilical hernia   Discharged Condition: good  Hospital Course: uneventful post op recovery.  Discharged home POD#1  Consults: None  Significant Diagnostic Studies:   Treatments: surgery: open umbilical hernia repair with mesh  Discharge Exam: Blood pressure 121/60, pulse 70, temperature 98.2 F (36.8 C), temperature source Oral, resp. rate 16, height 5' (1.524 m), weight 102.1 kg, last menstrual period 05/06/2013, SpO2 95%. General appearance: alert, cooperative, and no distress Resp: clear to auscultation bilaterally Cardio: regular rate and rhythm, S1, S2 normal, no murmur, click, rub or gallop Incision/Wound: abdomen soft, umbilical incision clean  Disposition: Discharge disposition: 01-Home or Self Care        Allergies as of 12/01/2023       Reactions   Ambien [zolpidem Tartrate] Other (See Comments)   Amnesia and fall    Losartan Swelling   Elevated creatinine and swelling   Prevacid [lansoprazole] Swelling   Adhesive [tape] Other (See Comments)   Adhesive tape and band aids " irritate, " causes blisters and pulls skin when removing. Okay to use paper tape   Keflex [cephalexin] Swelling   Swelling in ankles, feet   Motrin [ibuprofen] Other (See Comments)   ANKLE EDEMA   Prilosec [omeprazole] Swelling   Swelling in ankles.   Oxycodone    rash and itching   Ace Inhibitors Cough   Benadryl [diphenhydramine Hcl] Other (See Comments)   topical   Spironolactone Rash        Medication List     TAKE these medications    acetaminophen 325 MG tablet Commonly known as: TYLENOL Take 650 mg by mouth every 6 (six) hours as needed for moderate pain (pain score 4-6).   acetaZOLAMIDE 250 MG tablet Commonly known as: DIAMOX TAKE 1  TABLET (250 MG TOTAL) BY MOUTH DAILY. NEEDS FOLLOW UP APPOINTMENT FOR MORE REFILLS   Adcirca 20 MG tablet Generic drug: tadalafil (PAH) TAKE 2 TABLETS BY MOUTH 1 TIME A DAY.   allopurinol 100 MG tablet Commonly known as: ZYLOPRIM Take 1 tablet (100 mg total) by mouth 2 (two) times daily.   atenolol 50 MG tablet Commonly known as: TENORMIN Take 1 tablet (50 mg total) by mouth daily.   B-12 5000 MCG Caps Take 5,000 Units by mouth 2 (two) times a week.   carboxymethylcellulose 0.5 % Soln Commonly known as: REFRESH PLUS Place 1 drop into both eyes 3 (three) times daily as needed (dry eyes).   empagliflozin 10 MG Tabs tablet Commonly known as: Jardiance Take 1 tablet (10 mg total) by mouth daily.   furosemide 20 MG tablet Commonly known as: LASIX Take 20-40 mg by mouth See admin instructions. Take 40 mg by mouth in the morning and take 20 mg in the PM   levothyroxine 75 MCG tablet Commonly known as: SYNTHROID TAKE 1 TABLET BY MOUTH EVERY DAY   methocarbamol 500 MG tablet Commonly known as: ROBAXIN Take 1 tablet (500 mg total) by mouth every 6 (six) hours as needed for muscle spasms.   NON FORMULARY Pt uses a c-pap nightly   potassium chloride 10 MEQ tablet Commonly known as: KLOR-CON Take 2 tablets (20 mEq total) by mouth daily. What changed: when to take this   rosuvastatin 5 MG tablet Commonly known as: CRESTOR Take 1 tablet (5 mg total) by mouth daily.  SLOW FE PO Take 1 tablet by mouth 2 (two) times a week.   traMADol 50 MG tablet Commonly known as: ULTRAM TAKE 1 TABLET BY MOUTH 3 TIMES DAILY AS NEEDED.   Vitamin D 50 MCG (2000 UT) tablet Take 2,000 Units by mouth 3 (three) times a week.   Xarelto 20 MG Tabs tablet Generic drug: rivaroxaban TAKE 1 TABLET (20 MG TOTAL) BY MOUTH DAILY WITH SUPPER. NEEDS FOLLOW UP APPOINTMENT FOR MORE REFILLS        Follow-up Information     Abigail Miyamoto, MD. Schedule an appointment as soon as possible for a visit  in 4 week(s).   Specialty: General Surgery Contact information: [address] [place] [address] [phone]                 Signed: Abigail Miyamoto 12/01/2023, 7:39 AM

## 2023-12-01 NOTE — Plan of Care (Signed)

## 2023-12-08 ENCOUNTER — Telehealth: Payer: Self-pay

## 2023-12-08 NOTE — Telephone Encounter (Signed)
 Received a discharge from hospital notification.   Reach out to pt schedule a hospital follow up.   Pt reports she has future lab from Dr. Fabian Sharp that she needs to get done.   Inform pt to discuss with provider first at appt as provider may add another lab. But will let provider know and get back with pt.   Pt states if she needs to fast, it would be better for her to get it on the same day. But pt said she will go with whatever Dr. Fabian Sharp think is best.   Hospital f/u appt is schedule 12/26/2023 2:30pm.   Please advise.

## 2023-12-08 NOTE — Telephone Encounter (Signed)
-----   Message from [place] R sent at 12/06/2023 11:41 AM EDT ----- Please abstract and route to provider.

## 2023-12-10 ENCOUNTER — Other Ambulatory Visit: Payer: Self-pay | Admitting: Internal Medicine

## 2023-12-10 NOTE — Telephone Encounter (Signed)
 It looks like she was hospitalized for henria surgery  and no other new medical issues.  Don't see any instructions about lab   ut told to fu with surgeon on 4 weeks.   No medical or pcp  instructions given   So  no lab orders indicated before visit

## 2023-12-11 NOTE — Telephone Encounter (Signed)
 Spoke to pt and inform her of provider's message.   Pt ask if she can schedule her lab appt for future lab from Dr. Fabian Sharp. Apologize to pt for the confusion as this cma is not clear if provider still would like for it to be drawn before appt. And will let pt know.   Pt states she can discuss with provider at her appt time.  Forwarding to provider.

## 2023-12-22 ENCOUNTER — Other Ambulatory Visit (HOSPITAL_COMMUNITY): Payer: Self-pay | Admitting: Cardiology

## 2023-12-26 ENCOUNTER — Ambulatory Visit (INDEPENDENT_AMBULATORY_CARE_PROVIDER_SITE_OTHER): Admitting: Internal Medicine

## 2023-12-26 ENCOUNTER — Encounter: Payer: Self-pay | Admitting: Internal Medicine

## 2023-12-26 VITALS — BP 116/66 | HR 53 | Temp 98.0°F | Ht 60.0 in | Wt 226.8 lb

## 2023-12-26 DIAGNOSIS — Z8739 Personal history of other diseases of the musculoskeletal system and connective tissue: Secondary | ICD-10-CM

## 2023-12-26 DIAGNOSIS — Z79899 Other long term (current) drug therapy: Secondary | ICD-10-CM

## 2023-12-26 DIAGNOSIS — Z09 Encounter for follow-up examination after completed treatment for conditions other than malignant neoplasm: Secondary | ICD-10-CM | POA: Diagnosis not present

## 2023-12-26 DIAGNOSIS — Z9889 Other specified postprocedural states: Secondary | ICD-10-CM
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
## 2023-12-26 NOTE — Patient Instructions (Signed)
 Good to see  you today   Plan  appt in September  but call contact  me ahead about labs to be done before   visit Si I can place orders that will include  uric acid if not already done.

## 2023-12-29 ENCOUNTER — Other Ambulatory Visit (HOSPITAL_COMMUNITY): Payer: Self-pay | Admitting: Cardiology

## 2023-12-29 MED ORDER — RIVAROXABAN 20 MG PO TABS: 20.0000 mg | ORAL_TABLET | Freq: Every day | ORAL | 1 refills | Status: AC

## 2024-01-09 ENCOUNTER — Other Ambulatory Visit: Payer: Self-pay | Admitting: Internal Medicine

## 2024-01-09 DIAGNOSIS — Z0000 Encounter for general adult medical examination without abnormal findings: Secondary | ICD-10-CM

## 2024-01-10 ENCOUNTER — Ambulatory Visit
Admission: RE | Admit: 2024-01-10 | Discharge: 2024-01-10 | Disposition: A | Discharge status: Home / Self Care | Source: Ambulatory Visit | Attending: Internal Medicine | Admitting: Internal Medicine

## 2024-01-10 DIAGNOSIS — Z0000 Encounter for general adult medical examination without abnormal findings: Secondary | ICD-10-CM

## 2024-01-10 DIAGNOSIS — Z1231 Encounter for screening mammogram for malignant neoplasm of breast: Secondary | ICD-10-CM | POA: Diagnosis not present

## 2024-01-19 ENCOUNTER — Encounter (HOSPITAL_COMMUNITY): Payer: Self-pay | Admitting: Cardiology

## 2024-01-19 ENCOUNTER — Ambulatory Visit (HOSPITAL_COMMUNITY)
Admission: RE | Admit: 2024-01-19 | Discharge: 2024-01-19 | Disposition: A | Discharge status: Home / Self Care | Source: Ambulatory Visit | Attending: Cardiology | Admitting: Cardiology

## 2024-01-19 ENCOUNTER — Ambulatory Visit (HOSPITAL_COMMUNITY): Payer: Self-pay | Admitting: Cardiology

## 2024-01-19 VITALS — BP 128/54 | HR 48 | Ht 60.0 in | Wt 228.8 lb

## 2024-01-19 DIAGNOSIS — J9611 Chronic respiratory failure with hypoxia: Secondary | ICD-10-CM | POA: Insufficient documentation

## 2024-01-19 DIAGNOSIS — Z79899 Other long term (current) drug therapy: Secondary | ICD-10-CM | POA: Diagnosis not present

## 2024-01-19 DIAGNOSIS — Z8673 Personal history of transient ischemic attack (TIA), and cerebral infarction without residual deficits: Secondary | ICD-10-CM | POA: Diagnosis not present

## 2024-01-19 DIAGNOSIS — I272 Pulmonary hypertension, unspecified: Secondary | ICD-10-CM | POA: Insufficient documentation

## 2024-01-19 DIAGNOSIS — I110 Hypertensive heart disease with heart failure: Secondary | ICD-10-CM | POA: Diagnosis not present

## 2024-01-19 DIAGNOSIS — Z9981 Dependence on supplemental oxygen: Secondary | ICD-10-CM | POA: Diagnosis not present

## 2024-01-19 DIAGNOSIS — Z7984 Long term (current) use of oral hypoglycemic drugs: Secondary | ICD-10-CM | POA: Insufficient documentation

## 2024-01-19 DIAGNOSIS — G4733 Obstructive sleep apnea (adult) (pediatric): Secondary | ICD-10-CM | POA: Diagnosis not present

## 2024-01-19 DIAGNOSIS — I5032 Chronic diastolic (congestive) heart failure: Secondary | ICD-10-CM | POA: Diagnosis not present

## 2024-01-19 DIAGNOSIS — Z6841 Body Mass Index (BMI) 40.0 and over, adult: Secondary | ICD-10-CM | POA: Diagnosis not present

## 2024-01-19 DIAGNOSIS — I482 Chronic atrial fibrillation, unspecified: Secondary | ICD-10-CM | POA: Insufficient documentation

## 2024-01-19 DIAGNOSIS — Z7901 Long term (current) use of anticoagulants: Secondary | ICD-10-CM | POA: Insufficient documentation

## 2024-01-19 LAB — CBC
HCT: 42.9 % (ref 36.0–46.0)
Hemoglobin: 14.1 g/dL (ref 12.0–15.0)
MCH: 32.9 pg (ref 26.0–34.0)
MCHC: 32.9 g/dL (ref 30.0–36.0)
MCV: 100 fL (ref 80.0–100.0)
Platelets: 114 10*3/uL — ABNORMAL LOW (ref 150–400)
RBC: 4.29 MIL/uL (ref 3.87–5.11)
RDW: 13.4 % (ref 11.5–15.5)
WBC: 5.5 10*3/uL (ref 4.0–10.5)
nRBC: 0 % (ref 0.0–0.2)

## 2024-01-19 LAB — BASIC METABOLIC PANEL WITH GFR
Anion gap: 13 (ref 5–15)
BUN: 27 mg/dL — ABNORMAL HIGH (ref 8–23)
CO2: 24 mmol/L (ref 22–32)
Calcium: 9.4 mg/dL (ref 8.9–10.3)
Chloride: 107 mmol/L (ref 98–111)
Creatinine, Ser: 1.26 mg/dL — ABNORMAL HIGH (ref 0.44–1.00)
GFR, Estimated: 45 mL/min — ABNORMAL LOW (ref >60)
Glucose, Bld: 95 mg/dL (ref 70–99)
Potassium: 4.6 mmol/L (ref 3.5–5.1)
Sodium: 144 mmol/L (ref 135–145)

## 2024-01-19 LAB — BRAIN NATRIURETIC PEPTIDE: B Natriuretic Peptide: 355.1 pg/mL — ABNORMAL HIGH (ref 0.0–100.0)

## 2024-01-19 MED ORDER — ATENOLOL 25 MG PO TABS: 25.0000 mg | ORAL_TABLET | Freq: Every day | ORAL | 3 refills | Status: AC

## 2024-01-19 NOTE — Patient Instructions (Signed)
 DECREASE Atenolol  to 25 mg daily.  Labs done today, your results will be available in MyChart, we will contact you for abnormal readings.  Your physician has requested that you have an echocardiogram. Echocardiography is a painless test that uses sound waves to create images of your heart. It provides your doctor with information about the size and shape of your heart and how well your heart's chambers and valves are working. This procedure takes approximately one hour. There are no restrictions for this procedure. Please do NOT wear cologne, perfume, aftershave, or lotions (deodorant is allowed). Please arrive 15 minutes prior to your appointment time.  Please note: We ask at that you not bring children with you during ultrasound (echo/ vascular) testing. Due to room size and safety concerns, children are not allowed in the ultrasound rooms during exams. Our front office staff cannot provide observation of children in our lobby area while testing is being conducted. An adult accompanying a patient to their appointment will only be allowed in the ultrasound room at the discretion of the ultrasound technician under special circumstances. We apologize for any inconvenience.  You have been referred to the [facility]. They will call you to arrange your appointment.  Your physician recommends that you schedule a follow-up appointment in: 5 months.  If you have any questions or concerns before your next appointment please send us  a message through [facility] or call our office at [phone].    TO LEAVE A MESSAGE FOR THE NURSE SELECT OPTION 2, PLEASE LEAVE A MESSAGE INCLUDING: YOUR NAME DATE OF BIRTH CALL BACK NUMBER REASON FOR CALL**this is important as we prioritize the call backs  YOU WILL RECEIVE A CALL BACK THE SAME DAY AS LONG AS YOU CALL BEFORE 4:00 PM  At the [facility], you and your health needs are our priority. As part of our continuing mission to provide you  with exceptional heart care, we have created designated Provider Care Teams. These Care Teams include your primary Cardiologist (physician) and Advanced Practice Providers (APPs- Physician Assistants and Nurse Practitioners) who all work together to provide you with the care you need, when you need it.   You may see any of the following providers on your designated Care Team at your next follow up: Dr Jules Oar Dr Peder Bourdon Dr. Alwin Baars Dr. Arta Lark Amy Marijane Shoulders, NP Ruddy Corral, [place] [facility] [place], [place] Dennise Fitz, NP [place] Lee, NP Shawnee Dellen, NP Luster Salters, PharmD Bevely Brush, PharmD   Please be sure to bring in all your medications bottles to every appointment.    Thank you for choosing  [facility]

## 2024-01-21 NOTE — Progress Notes (Signed)
 Date:  01/21/2024   ID:  Stacy, [name] 01-26-1951, MRN [id number]   Provider location: [place] [facility] Type of Visit: Established patient   PCP:  Reginal Capra, MD  Cardiologist:  Dr. Mitzie Anda  Chief complaint: CHF  HPI: [name] is a 73 y.o. female with PMH of pulmonary HTN, morbid obesity, chronic afib (on Xarelto , CVA in 2012, OHS/OSA (Had U PE3 surgery, and chronic respiratory failure with hypoxemia on continuous 02 at 2 lpm via [name]).   Admitted 3/2 ->11/17/16 with A/C diastolic CHF and A/C respiratory failure. Pt initially refused Bipap so venti mask used. Eventually tolerated transition to BiPAP. Overall pt diuresed from 285 lbs down to 229 lbs with 38 L of diuresis.  Echo 11/07/16 EF 55-60%, PASP 90mm Hg, mildly dilated RV with moderate to severely decreased RV systolic function. RHC/LHC in 3/18 showed no coronary disease and confirmed severe PH.    Echo in 7/20 showed EF 60-65%, mild LVH, normal RV, PASP 38 mmHg, mild AS. Echo in 8/21 showed EF 60-65%, mildly decreased RV function, severe biatrial enlargement, small PFO, unable to estimate PA systolic pressure.   Echo 1/23, EF 60-65%, mild LVH, normal RV, mild RV enlargement, severe biatrial enlargement, mild MR, no significant aortic stenosis.   Echo 10/24 EF 60-65%, normal RV, severe biatrial enlargement, mild MR, suspect PFO, PASP 27 mmHg.   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 She had umbilical hernia repair in 3/25.    Today she returns for HF follow up. Weight is up 5 lbs.  She is not taking finerenone  as she cannot get insurance coverage. HR noted to be low today in 40s but denies denies lightheadedness or syncope.  No dyspnea walking on flat ground but she does get short of breath carrying in groceries.  No chest pain.  No orthopnea/PND.  No longer using oxygen . She uses her CPAP regularly.   ECG (personally reviewed): Atrial fibrillation rate 44, right axis  deviation   Labs (5/23): K 5.1, creatinine 1.28 Labs (9/24): K 3.6, creatinine 1.37, LDL 63, TGs 100, TSH normal Labs (11/24): K 3.9, creatinine 1.15 Labs (2/25): BNP 219 Labs (3/25): hgb 14.3, K 3.9, creatinine 1.26   PMH 1. 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 - RHC/LHC 11/07/16: No angiographic CAD; PA 90/55, LVEDP 23 (PCWP inaccurate), CI 2.15 Fick/2.32 Thermo, shunt run negative, PVR 5.7 WU.  - Echo (7/20): EF 60-65%, mild LVH, normal RV, PASP 38 mmHg, mild AS.  - Echo (8/21): EF 60-65%, mildly decreased RV function, severe biatrial enlargement, small PFO, unable to estimate PA systolic pressure. - Echo (1/23): EF 60-65%, mild LVH, normal RV, mild RV enlargement, severe biatrial enlargement, mild MR, no significant aortic stenosis.  - Echo (10/24): EF 60-65%, normal RV, severe biatrial enlargement, mild MR, suspect PFO, PASP 27 mmHg.  2. Chronic hypercarbic/hypoxic respiratory failure with OHS/OSA. Sees Dr. McQuaid. Using nightly BiPAP and oxygen  by nasal cannula during the day.  3. Atrial fibrillation: Chronic - Rate controlled on Xarelto  + atenolol   4. 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  PVR 5.7 WU on RHC 3/18.  Suspect group 2 (elevated LA pressure) and group 3 (OHS/OSA) PH. However, cannot rule out group 1 component.  She had V/Q scan that was not suggestive of chronic PE and  high resolution chest CT that was not suggestive of ILD.  ANA, RF, anti-SCL70 all negative.   5. H/o CVA 6. Hypothyroidism 7. H/o TKR 8. Aortic stenosis: Mild on 7/20 echo. No evidence for significant stenosis on 1/23 echo.   Current Outpatient Medications  Medication Sig Dispense Refill   acetaminophen  (TYLENOL ) 325 MG tablet Take 650 mg by mouth every 6 (six) hours as needed for moderate pain (pain  score 4-6).     acetaZOLAMIDE  (DIAMOX ) 250 MG tablet TAKE 1 TABLET (250 MG TOTAL) BY MOUTH DAILY. NEEDS FOLLOW UP APPOINTMENT FOR MORE REFILLS 90 tablet 1   ADCIRCA  20 MG tablet TAKE 2 TABLETS BY MOUTH 1 TIME A DAY. 60 tablet 11   carboxymethylcellulose (REFRESH PLUS) 0.5 % SOLN Place 1 drop into both eyes 3 (three) times daily as needed (dry eyes).     Cholecalciferol  (VITAMIN D ) 50 MCG (2000 UT) tablet Take 2,000 Units by mouth 3 (three) times a week.     Cyanocobalamin  (B-12) 5000 MCG CAPS Take 5,000 Units by mouth 2 (two) times a week.     empagliflozin  (JARDIANCE ) 10 MG TABS tablet Take 1 tablet (10 mg total) by mouth daily. 30 tablet 11   Ferrous Sulfate (SLOW FE PO) Take 1 tablet by mouth 2 (two) times a week.     furosemide  (LASIX ) 20 MG tablet Take 20-40 mg by mouth See admin instructions. Take 40 mg by mouth in the morning and take 20 mg in the PM     levothyroxine  (SYNTHROID ) 75 MCG tablet TAKE 1 TABLET BY MOUTH EVERY DAY 90 tablet 1   NON FORMULARY Pt uses a c-pap nightly     potassium chloride  (KLOR-CON ) 10 MEQ tablet Take 10 mEq by mouth 2 (two) times daily.     rivaroxaban  (XARELTO ) 20 MG TABS tablet Take 1 tablet (20 mg total) by mouth daily with supper. 90 tablet 1   rosuvastatin  (CRESTOR ) 5 MG tablet Take 1 tablet (5 mg total) by mouth daily. 90 tablet 3   traMADol  (ULTRAM ) 50 MG tablet TAKE 1 TABLET BY MOUTH 3 TIMES DAILY AS NEEDED. 15 tablet 1   atenolol  (TENORMIN ) 25 MG tablet Take 1 tablet (25 mg total) by mouth daily. 90 tablet 3   No current facility-administered medications for this encounter.    Allergies:   Ambien [zolpidem tartrate], Losartan , Prevacid [lansoprazole], Adhesive [tape], Keflex  [cephalexin ], Motrin [ibuprofen], Prilosec [omeprazole], Oxycodone , Ace inhibitors, Benadryl [diphenhydramine hcl], and Spironolactone    Social History:  The patient  reports that she has never smoked. She has never used smokeless tobacco. She reports current alcohol  use of about  1.0 standard drink of alcohol  per week. She reports that she does not use drugs.   Family History:  The patient's family history includes COPD in her mother; Diabetes in her father; Esophageal cancer in her cousin; Heart attack in her father; Hypertension in her brother, father, mother, and sister; Liver cancer in her father; Osteoporosis in her mother; Stroke in her maternal grandmother.   ROS:  Please see the history of present illness.   All other systems are personally reviewed and negative.   Recent Labs: 05/30/2023: ALT 7; TSH 1.05 01/19/2024: B Natriuretic Peptide 355.1; BUN 27; Creatinine, Ser 1.26; Hemoglobin 14.1; Platelets 114; Potassium 4.6; Sodium 144  Personally reviewed   Wt Readings from Last 3 Encounters:  01/19/24 103.8 kg (228 lb 12.8 oz)  12/26/23 102.9 kg (226 lb 12.8 oz)  11/30/23 102.1 kg (225 lb)   BP Aaron Aas)  128/54   Pulse (!) 48   Ht 5' (1.524 m)   Wt 103.8 kg (228 lb 12.8 oz)   LMP 05/06/2013   SpO2 96%   BMI 44.68 kg/m   PHYSICAL EXAM: General: NAD Neck: No JVD, no thyromegaly or thyroid  nodule.  Lungs: Clear to auscultation bilaterally with normal respiratory effort. CV: Nondisplaced PMI.  Heart irregular S1/S2, no S3/S4, no murmur.  No peripheral edema.   Abdomen: Soft, nontender, no hepatosplenomegaly, no distention.  Skin: Intact without lesions or rashes.  Neurologic: Alert and oriented x 3.  Psych: Normal affect. Extremities: No clubbing or cyanosis.  HEENT: Normal.   ASSESSMENT AND PLAN: 1. Chronic diastolic CHF:  Associated with severe pulmonary hypertension and prominent RV failure.  Echo 10/24 showed EF 60-65%, normal RV, severe biatrial enlargement, mild MR, suspect PFO, PASP 27 mmHg. NYHA class II, not significantly volume overloaded.  - Continue Lasix  40 qam/20 qpm + acetazolamide  250 mg daily.  BMET/BNP today - Continue Jardiance  10 mg daily. No GU symptoms.  - She was unable to get insurance coverage for finerenone .  2. Chronic  hypercarbic/hypoxic respiratory failure with OHS/OSA:  Using CPAP at night. She is not using oxygen  during the day at this point, has lost weight. - She is on acetazolamide  still.  3. Atrial fibrillation: Chronic, now running bradycardic (asymptomatic). - Decrease atenolol  to 25 mg daily.  Can stop altogether if HR remains low.     - Continue Xarelto  for anticoagulation. CBC today. 4. 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  PVR 5.7 WU by RHC in 3/18.  Suspect group 2 (elevated LA pressure) and group 3 (OHS/OSA) PH. However, cannot rule out group 1 component.  She had V/Q scan that was not suggestive of chronic PE and high resolution chest CT that was not suggestive of ILD.  ANA, RF, anti-SCL70 all negative.  She did not have a marked improvement from taking Adcirca  => suspect predominantly group 2 and 3 PH.  Will hold off on additional pulmonary vasodilators. Echo 1/23 showed with mildly enlarged RV with normal systolic function, unable to estimate PA systolic pressure. Echo 10/24 showed normal RV with PASP estimated 27 mmHg (improved).  - Continue Lasix  as above. BNP today. - Continue Adcirca  40 mg daily.  - Continue CPAP at night for OSA, no longer requiring oxygen  during the day.  5. HTN: BP well controlled 6. Obesity: Body mass index is 44.68 kg/m. - Will see if her insurance will cover GLP-1 agonist this year.  Follow up in 4 months with APP  I spent 31 minutes reviewing records, interviewing/examining patient, and managing orders.   Signed, Peder Bourdon, MD  01/21/2024  [facility] [address] [facility] [place] [address] [phone] (office) [phone] (fax)

## 2024-01-26 DIAGNOSIS — Z09 Encounter for follow-up examination after completed treatment for conditions other than malignant neoplasm: Secondary | ICD-10-CM | POA: Diagnosis not present

## 2024-01-26 DIAGNOSIS — K429 Umbilical hernia without obstruction or gangrene: Secondary | ICD-10-CM | POA: Diagnosis not present

## 2024-02-14 DIAGNOSIS — L578 Other skin changes due to chronic exposure to nonionizing radiation: Secondary | ICD-10-CM | POA: Diagnosis not present

## 2024-02-14 DIAGNOSIS — L814 Other melanin hyperpigmentation: Secondary | ICD-10-CM | POA: Diagnosis not present

## 2024-02-14 DIAGNOSIS — L821 Other seborrheic keratosis: Secondary | ICD-10-CM | POA: Diagnosis not present

## 2024-02-14 DIAGNOSIS — D239 Other benign neoplasm of skin, unspecified: Secondary | ICD-10-CM | POA: Diagnosis not present

## 2024-02-14 DIAGNOSIS — L853 Xerosis cutis: Secondary | ICD-10-CM | POA: Diagnosis not present

## 2024-02-14 DIAGNOSIS — Z85828 Personal history of other malignant neoplasm of skin: Secondary | ICD-10-CM | POA: Diagnosis not present

## 2024-02-14 DIAGNOSIS — L658 Other specified nonscarring hair loss: Secondary | ICD-10-CM | POA: Diagnosis not present

## 2024-03-07 ENCOUNTER — Other Ambulatory Visit (HOSPITAL_COMMUNITY): Payer: Self-pay | Admitting: Family Medicine

## 2024-03-07 ENCOUNTER — Other Ambulatory Visit: Payer: Self-pay | Admitting: Family

## 2024-03-13 ENCOUNTER — Other Ambulatory Visit: Payer: Self-pay

## 2024-03-13 ENCOUNTER — Telehealth: Payer: Self-pay

## 2024-03-13 DIAGNOSIS — G4733 Obstructive sleep apnea (adult) (pediatric): Secondary | ICD-10-CM | POA: Diagnosis not present

## 2024-03-13 DIAGNOSIS — J9610 Chronic respiratory failure, unspecified whether with hypoxia or hypercapnia: Secondary | ICD-10-CM | POA: Diagnosis not present

## 2024-03-13 MED ORDER — LEVOTHYROXINE SODIUM 75 MCG PO TABS: 75.0000 ug | ORAL_TABLET | Freq: Every day | ORAL | 1 refills | Status: DC

## 2024-03-13 NOTE — Telephone Encounter (Signed)
 Copied from CRM [id number]. Topic: Clinical - Prescription Issue >> Mar 13, 2024  9:38 AM Robinson H wrote: Reason for CRM: Patient is calling to check status of refill request for her levothyroxine  (SYNTHROID ) 75 MCG tablet, refill pending since 7/3 please reach out to patient for clarity.  [place] [phone]

## 2024-03-13 NOTE — Telephone Encounter (Signed)
 Contacted pt and inform her, Rx refill is sent to her pharmacy.   Remind pt that she will be due for lab and office visit in September. Pt would like to go ahead and schedule an appt. Office visit is made for 9/2 and lab appt on 8/27.   Please advise on lab order. Thank you!

## 2024-03-14 ENCOUNTER — Telehealth: Payer: Self-pay | Admitting: Pharmacy Technician

## 2024-03-14 ENCOUNTER — Other Ambulatory Visit (HOSPITAL_COMMUNITY): Payer: Self-pay

## 2024-03-14 ENCOUNTER — Ambulatory Visit: Attending: Cardiology | Admitting: Pharmacist

## 2024-03-14 DIAGNOSIS — Z8673 Personal history of transient ischemic attack (TIA), and cerebral infarction without residual deficits: Secondary | ICD-10-CM

## 2024-03-14 DIAGNOSIS — G4733 Obstructive sleep apnea (adult) (pediatric): Secondary | ICD-10-CM | POA: Diagnosis not present
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
## 2024-03-14 NOTE — Telephone Encounter (Signed)
 Pharmacy Patient Advocate Encounter   Received notification from Physician's Office (Melissa)that prior authorization for Wegovy 0.25MG  is required/requested.   Insurance verification completed.   The patient is insured through [facility] [employer]/RX ADVANCE .   Per test claim: PA required; PA submitted to above mentioned insurance via CoverMyMeds Key/confirmation #/EOC [id number] Status is pending

## 2024-03-14 NOTE — Patient Instructions (Signed)
 GLP-1 Receptor Agonist Counseling Points This medication reduces your appetite and may make you feel fuller longer.  Stop eating when your body tells you that you are full. This will likely happen sooner than you are used to. Fried/greasy food and sweets may upset your stomach - minimize these as much as possible. Store your medication in the fridge until you are ready to use it. Inject your medication in the fatty tissue of your lower abdominal area (2 inches away from belly button) or upper outer thigh. Rotate injection sites. Common side effects include: nausea, diarrhea/constipation, and heartburn, and are more likely to occur if you overeat. Stop your injection for 7 days prior to surgical procedures requiring anesthesia.  Dosing schedule:  We will touch base with you monthly over the phone. The medication can be increased in monthly intervals depending on tolerability and efficacy.  Tips for success: Write down the reasons why you want to lose weight and post it in a place where you'll see it often.  Start small and work your way up. Keep in mind that it takes time to achieve goals, and small steps add up.  Any additional movements help to burn calories. Taking the stairs rather than the elevator and parking at the far end of your parking lot are easy ways to start. Brisk walking for at least 30 minutes 4 or more days of the week is an excellent goal to work toward  Understanding what it means to feel full: Did you know that it can take 15 minutes or more for your brain to receive the message that you've eaten? That means that, if you eat less food, but consume it slower, you may still feel satisfied.  Eating a lot of fruits and vegetables can also help you feel fuller.  Eat off of smaller plates so that moderate portions don't seem too small  Tips for living a healthier life     Building a Healthy and Balanced Diet Make most of your meal vegetables and fruits -  of your  plate. Aim for color and variety, and remember that potatoes don't count as vegetables on the Healthy Eating Plate because of their negative impact on blood sugar.  Go for whole grains -  of your plate. Whole and intact grains--whole wheat, barley, wheat berries, quinoa, oats, brown rice, and foods made with them, such as whole wheat pasta--have a milder effect on blood sugar and insulin than white bread, white rice, and other refined grains.  Protein power -  of your plate. Fish, poultry, beans, and nuts are all healthy, versatile protein sources--they can be mixed into salads, and pair well with vegetables on a plate. Limit red meat, and avoid processed meats such as bacon and sausage.  Healthy plant oils - in moderation. Choose healthy vegetable oils like olive, canola, soy, corn, sunflower, peanut, and others, and avoid partially hydrogenated oils, which contain unhealthy trans fats. Remember that low-fat does not mean "healthy."  Drink water, coffee, or tea. Skip sugary drinks, limit milk and dairy products to one to two servings per day, and limit juice to a small glass per day.  Stay active. The red figure running across the Healthy Eating Plate's placemat is a reminder that staying active is also important in weight control.  The main message of the Healthy Eating Plate is to focus on diet quality:  The type of carbohydrate in the diet is more important than the amount of carbohydrate in the diet, because some sources  of carbohydrate--like vegetables (other than potatoes), fruits, whole grains, and beans--are healthier than others. The Healthy Eating Plate also advises consumers to avoid sugary beverages, a major source of calories--usually with little nutritional value--in the American diet. The Healthy Eating Plate encourages consumers to use healthy oils, and it does not set a maximum on the percentage of calories people should get each day from healthy sources of fat. In this way,  the Healthy Eating Plate recommends the opposite of the low-fat message promoted for decades by the USDA.  [employer]  SUGAR  Sugar is a huge problem in the modern day diet. Sugar is a big contributor to heart disease, diabetes, high triglyceride levels, fatty liver disease and obesity. Sugar is hidden in almost all packaged foods/beverages. Added sugar is extra sugar that is added beyond what is naturally found and has no nutritional benefit for your body. The American Heart Association recommends limiting added sugars to no more than 25g for women and 36 grams for men per day. There are many names for sugar including maltose, sucrose (names ending in "ose"), high fructose corn syrup, molasses, cane sugar, corn sweetener, raw sugar, syrup, honey or fruit juice concentrate.   One of the best ways to limit your added sugars is to stop drinking sweetened beverages such as soda, sweet tea, and fruit juice.  There is 65g of added sugars in one 20oz bottle of Coke! That is equal to 7.5 donuts.   Pay attention and read all nutrition facts labels. Below is an examples of a nutrition facts label. The #1 is showing you the total sugars where the # 2 is showing you the added sugars. This one serving has almost the max amount of added sugars per day!   EXERCISE  Exercise is good. We've all heard that. In an ideal world, we would all have time and resources to get plenty of it. When you are active, your heart pumps more efficiently and you will feel better.  Multiple studies show that even walking regularly has benefits that include living a longer life. The American Heart Association recommends 150 minutes per week of exercise (30 minutes per day most days of the week). You can do this in any increment you wish. Nine or more 10-minute walks count. So does an hour-long exercise class. Break the time apart into what will work in your life. Some of the best  things you can do include walking briskly, jogging, cycling or swimming laps. Not everyone is ready to "exercise." Sometimes we need to start with just getting active. Here are some easy ways to be more active throughout the day:  Take the stairs instead of the elevator  Go for a 10-15 minute walk during your lunch break (find a friend to make it more enjoyable)  When shopping, park at the back of the parking lot  If you take public transportation, get off one stop early and walk the extra distance  Pace around while making phone calls  Check with your doctor if you aren't sure what your limitations may be. Always remember to drink plenty of water when doing any type of exercise. Don't feel like a failure if you're not getting the 90-150 minutes per week. If you started by being a couch potato, then just a 10-minute walk each day is a huge improvement. Start with little victories and work your way up.   HEALTHY EATING TIPS              Plan  ahead: make a menu of the meals for a week then create a grocery list to go with that menu. Consider meals that easily stretch into a night of leftovers, such as stews or casseroles. Or consider making two of your favorite meal and put one in the freezer for another night. Try a night or two each week that is "meatless" or "no cook" such as salads. When you get home from the grocery store wash and prepare your vegetables and fruits. Then when you need them they are ready to go.   Tips for going to the grocery store:  Buy store or generic brands  Check the weekly ad from your store on-line or in their in-store flyer  Look at the unit price on the shelf tag to compare/contrast the costs of different items  Buy fruits/vegetables in season  Carrots, bananas and apples are low-cost, naturally healthy items  If meats or frozen vegetables are on sale, buy some extras and put in your freezer  Limit buying prepared or "ready to eat" items, even if they are pre-made  salads or fruit snacks  Do not shop when you're hungry  Foods at eye level tend to be more expensive. Look on the high and low shelves for deals.  Consider shopping at the farmer's market for fresh foods in season.  Avoid the cookie and chip aisles (these are expensive, high in calories and low in nutritional value). Shop on the outside of the grocery store.  Healthy food preparations:  If you can't get lean hamburger, be sure to drain the fat when cooking  Steam, saut (in olive oil), grill or bake foods  Experiment with different seasonings to avoid adding salt to your foods. Kosher salt, sea salt and Himalayan salt are all still salt and should be avoided. Try seasoning food with onion, garlic, thyme, rosemary, basil ect. Onion powder or garlic powder is ok. Avoid if it says salt (ie garlic salt).

## 2024-03-15 ENCOUNTER — Other Ambulatory Visit (HOSPITAL_COMMUNITY): Payer: Self-pay

## 2024-03-15 ENCOUNTER — Encounter: Payer: Self-pay | Admitting: Pharmacist

## 2024-03-15 NOTE — Telephone Encounter (Signed)
 Pharmacy Patient Advocate Encounter  Received notification from [employer] that Prior Authorization for Wegovy0.25MG  has been APPROVED from 03/14/24 to 09/04/24. Ran test claim, Copay is $100.00- 1 month,  250.00- 3 months. This test claim was processed through [facility]- copay amounts may vary at other pharmacies due to pharmacy/plan contracts, or as the patient moves through the different stages of their insurance plan.   PA #/Case ID/Reference #: [id number]

## 2024-03-27 DIAGNOSIS — M25522 Pain in left elbow: Secondary | ICD-10-CM | POA: Diagnosis not present

## 2024-03-27 DIAGNOSIS — M19042 Primary osteoarthritis, left hand: Secondary | ICD-10-CM | POA: Diagnosis not present

## 2024-03-27 DIAGNOSIS — M79641 Pain in right hand: Secondary | ICD-10-CM | POA: Diagnosis not present

## 2024-03-27 DIAGNOSIS — M19041 Primary osteoarthritis, right hand: Secondary | ICD-10-CM | POA: Diagnosis not present

## 2024-03-27 DIAGNOSIS — M19031 Primary osteoarthritis, right wrist: Secondary | ICD-10-CM | POA: Diagnosis not present

## 2024-03-27 DIAGNOSIS — M19022 Primary osteoarthritis, left elbow: Secondary | ICD-10-CM | POA: Diagnosis not present

## 2024-03-27 DIAGNOSIS — M79642 Pain in left hand: Secondary | ICD-10-CM | POA: Diagnosis not present

## 2024-03-27 DIAGNOSIS — M19032 Primary osteoarthritis, left wrist: Secondary | ICD-10-CM | POA: Diagnosis not present

## 2024-04-02 NOTE — Telephone Encounter (Signed)
 It looks like future labs are in the system  please adivse  if need something else

## 2024-04-03 NOTE — Telephone Encounter (Signed)
 Spoke to pt. Update her that labs are already in and she is ready for her lab appt on 05/01/2024. Pt verbalized understanding.

## 2024-04-29 ENCOUNTER — Telehealth: Payer: Self-pay | Admitting: Pharmacist

## 2024-04-29 ENCOUNTER — Other Ambulatory Visit (HOSPITAL_BASED_OUTPATIENT_CLINIC_OR_DEPARTMENT_OTHER): Payer: Self-pay

## 2024-04-29 ENCOUNTER — Other Ambulatory Visit (HOSPITAL_COMMUNITY): Payer: Self-pay

## 2024-04-29 MED ORDER — WEGOVY 0.25 MG/0.5ML ~~LOC~~ SOAJ
0.2500 mg | SUBCUTANEOUS | 0 refills | Status: DC
  Filled 2024-04-29: qty 2, 28d supply, fill #0

## 2024-04-29 NOTE — Telephone Encounter (Signed)
 Patient called stating she is ready to start Wegovy . Rx sent to [facility]. Reviewed injection technique over the phone along with side effects and the importance of protein and strength training. F/U in 3-4 weeks

## 2024-05-01 ENCOUNTER — Other Ambulatory Visit (INDEPENDENT_AMBULATORY_CARE_PROVIDER_SITE_OTHER)

## 2024-05-01 DIAGNOSIS — I5032 Chronic diastolic (congestive) heart failure: Secondary | ICD-10-CM | POA: Diagnosis not present

## 2024-05-01 DIAGNOSIS — D696 Thrombocytopenia, unspecified: Secondary | ICD-10-CM | POA: Diagnosis not present

## 2024-05-01 DIAGNOSIS — E039 Hypothyroidism, unspecified: Secondary | ICD-10-CM

## 2024-05-01 DIAGNOSIS — Z79899 Other long term (current) drug therapy: Secondary | ICD-10-CM

## 2024-05-01 DIAGNOSIS — Z8739 Personal history of other diseases of the musculoskeletal system and connective tissue: Secondary | ICD-10-CM

## 2024-05-01 DIAGNOSIS — R7303 Prediabetes: Secondary | ICD-10-CM | POA: Diagnosis not present

## 2024-05-01 LAB — BASIC METABOLIC PANEL WITH GFR
BUN: 24 mg/dL — ABNORMAL HIGH (ref 6–23)
CO2: 26 meq/L (ref 19–32)
Calcium: 9 mg/dL (ref 8.4–10.5)
Chloride: 109 meq/L (ref 96–112)
Creatinine, Ser: 1.31 mg/dL — ABNORMAL HIGH (ref 0.40–1.20)
GFR: 40.08 mL/min — ABNORMAL LOW (ref >60.00)
Glucose, Bld: 103 mg/dL — ABNORMAL HIGH (ref 70–99)
Potassium: 4.6 meq/L (ref 3.5–5.1)
Sodium: 146 meq/L — ABNORMAL HIGH (ref 135–145)

## 2024-05-01 LAB — CBC WITH DIFFERENTIAL/PLATELET
Basophils Absolute: 0 K/uL (ref 0.0–0.1)
Basophils Relative: 0.5 % (ref 0.0–3.0)
Eosinophils Absolute: 0.2 K/uL (ref 0.0–0.7)
Eosinophils Relative: 3.6 % (ref 0.0–5.0)
HCT: 46.2 % — ABNORMAL HIGH (ref 36.0–46.0)
Hemoglobin: 14.9 g/dL (ref 12.0–15.0)
Lymphocytes Relative: 17.9 % (ref 12.0–46.0)
Lymphs Abs: 1 K/uL (ref 0.7–4.0)
MCHC: 32.2 g/dL (ref 30.0–36.0)
MCV: 99 fl (ref 78.0–100.0)
Monocytes Absolute: 0.4 K/uL (ref 0.1–1.0)
Monocytes Relative: 7.3 % (ref 3.0–12.0)
Neutro Abs: 3.8 K/uL (ref 1.4–7.7)
Neutrophils Relative %: 70.7 % (ref 43.0–77.0)
Platelets: 120 K/uL — ABNORMAL LOW (ref 150.0–400.0)
RBC: 4.67 Mil/uL (ref 3.87–5.11)
RDW: 14.1 % (ref 11.5–15.5)
WBC: 5.4 K/uL (ref 4.0–10.5)

## 2024-05-01 LAB — HEMOGLOBIN A1C: Hgb A1c MFr Bld: 5.9 % (ref 4.6–6.5)

## 2024-05-01 LAB — TSH: TSH: 1.85 u[IU]/mL (ref 0.35–5.50)

## 2024-05-01 LAB — URIC ACID: Uric Acid, Serum: 6.7 mg/dL (ref 2.4–7.0)

## 2024-05-07 ENCOUNTER — Encounter: Payer: Self-pay | Admitting: Internal Medicine

## 2024-05-07 ENCOUNTER — Ambulatory Visit: Admitting: Internal Medicine

## 2024-05-07 VITALS — BP 124/68 | HR 61 | Temp 98.0°F | Ht 60.0 in | Wt 231.0 lb

## 2024-05-07 DIAGNOSIS — N1832 Chronic kidney disease, stage 3b: Secondary | ICD-10-CM

## 2024-05-07 DIAGNOSIS — Z8739 Personal history of other diseases of the musculoskeletal system and connective tissue: Secondary | ICD-10-CM

## 2024-05-07 DIAGNOSIS — Z79899 Other long term (current) drug therapy: Secondary | ICD-10-CM | POA: Diagnosis not present

## 2024-05-07 DIAGNOSIS — D696 Thrombocytopenia, unspecified: Secondary | ICD-10-CM | POA: Diagnosis not present
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
## 2024-05-07 NOTE — Patient Instructions (Signed)
 Good to see  you today . Uric acid 6.7  goal is 6 and below  for prevention   I suggest we  watch again and if  recurrent problem then we can go back on prevention.

## 2024-05-14 ENCOUNTER — Ambulatory Visit (INDEPENDENT_AMBULATORY_CARE_PROVIDER_SITE_OTHER): Admitting: Adult Health

## 2024-05-14 ENCOUNTER — Encounter: Payer: Self-pay | Admitting: Adult Health

## 2024-05-14 VITALS — BP 100/60 | HR 64 | Ht 60.0 in | Wt 230.0 lb

## 2024-05-14 DIAGNOSIS — G4733 Obstructive sleep apnea (adult) (pediatric): Secondary | ICD-10-CM | POA: Diagnosis not present

## 2024-05-14 DIAGNOSIS — I272 Pulmonary hypertension, unspecified: Secondary | ICD-10-CM | POA: Diagnosis not present

## 2024-05-14 DIAGNOSIS — J9611 Chronic respiratory failure with hypoxia: Secondary | ICD-10-CM
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
## 2024-05-14 NOTE — Patient Instructions (Addendum)
 Continue on CPAP  At bedtime  Keep up good work.  Continue on Oxygen  2l/m with activity As needed  , goal is for Oxygen  level >88-90%  Work on healthy weight  Do not drive if sleepy.  Follow up in 1 year and As needed

## 2024-05-17 ENCOUNTER — Telehealth (HOSPITAL_BASED_OUTPATIENT_CLINIC_OR_DEPARTMENT_OTHER): Payer: Self-pay

## 2024-05-17 ENCOUNTER — Telehealth: Payer: Self-pay | Admitting: Pharmacist

## 2024-05-17 ENCOUNTER — Other Ambulatory Visit (HOSPITAL_COMMUNITY): Payer: Self-pay

## 2024-05-17 NOTE — Telephone Encounter (Signed)
 Copied from CRM #[id number]. Topic: Clinical - Prescription Issue >> May 16, 2024  3:51 PM [name] wrote: Reason for CRM: [facility] is [name].   [id number] [place] - [address] [place] [address] Phone: [phone] Fax: [phone]   Callback number: [phone]

## 2024-05-17 NOTE — Telephone Encounter (Signed)
 Patient wants to see if HTA would cover Zepbound instead. She does have OSA with an AHI of 114. Will submit PA

## 2024-05-17 NOTE — Telephone Encounter (Signed)
 Pharmacy Patient Advocate Encounter   Received notification from Physician's Office that prior authorization for ZEPBOUND is required/requested.   Insurance verification completed.   The patient is insured through [facility] ADVANTAGE/RX ADVANCE .   Per test claim: PA required; PA submitted to above mentioned insurance via Latent Key/confirmation #/EOC [id number] Status is pending

## 2024-05-20 NOTE — Telephone Encounter (Signed)
 Pharmacy Patient Advocate Encounter  Received notification from [facility] ADVANTAGE/RX ADVANCE that Prior Authorization for ZEPBOUND has been DENIED.  Full denial letter will be uploaded to the media tab. See denial reason below.  NON FORM CRITERIA NOT MET PER PLAN. LISTED WEGOVY  AS TRIED AND FAILED.

## 2024-05-21 ENCOUNTER — Encounter: Payer: Self-pay | Admitting: Pharmacist

## 2024-05-23 ENCOUNTER — Other Ambulatory Visit (HOSPITAL_BASED_OUTPATIENT_CLINIC_OR_DEPARTMENT_OTHER): Payer: Self-pay

## 2024-05-23 ENCOUNTER — Other Ambulatory Visit: Payer: Self-pay | Admitting: Pulmonary Disease

## 2024-05-23 MED ORDER — WEGOVY 0.5 MG/0.5ML ~~LOC~~ SOAJ
0.5000 mg | SUBCUTANEOUS | 0 refills | Status: DC
  Filled 2024-05-23: qty 2, 28d supply, fill #0

## 2024-05-23 MED ORDER — COVID-19 MRNA VAC-TRIS(PFIZER) 30 MCG/0.3ML IM SUSY: 0.3000 mL | PREFILLED_SYRINGE | Freq: Once | INTRAMUSCULAR | 0 refills | Status: AC

## 2024-05-24 ENCOUNTER — Other Ambulatory Visit (HOSPITAL_BASED_OUTPATIENT_CLINIC_OR_DEPARTMENT_OTHER): Payer: Self-pay

## 2024-05-24 ENCOUNTER — Other Ambulatory Visit (HOSPITAL_COMMUNITY): Payer: Self-pay | Admitting: Family Medicine

## 2024-05-28 ENCOUNTER — Other Ambulatory Visit (HOSPITAL_COMMUNITY): Payer: Self-pay | Admitting: Cardiology

## 2024-05-30 ENCOUNTER — Ambulatory Visit (INDEPENDENT_AMBULATORY_CARE_PROVIDER_SITE_OTHER): Admitting: Family Medicine

## 2024-05-30 DIAGNOSIS — Z1211 Encounter for screening for malignant neoplasm of colon: Secondary | ICD-10-CM

## 2024-05-30 NOTE — Progress Notes (Signed)
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 Classes (a variety of topics): see seniorplanet.org or call [phone]  -consider volunteering at a school, hospice center, church, senior center or elsewhere            Stacy JONELLE Cramp, DO

## 2024-05-30 NOTE — Patient Instructions (Addendum)
 I really enjoyed getting to talk with you today! I am available on Tuesdays and Thursdays for virtual visits if you have any questions or concerns, or if I can be of any further assistance.   CHECKLIST FROM ANNUAL WELLNESS VISIT:  -Follow up (please call to schedule if not scheduled after visit):   -yearly for annual wellness visit with primary care office  Here is a list of your preventive care/health maintenance measures and the plan for each if any are due:  PLAN For any measures below that may be due:    1. Can get Tdap and Prevnar 20 at the pharmacy. Please bring proof of receipt if you do so that we can update your record accurately.    2. Sent order for Cologuard test. If you have not received the kit in the next 1-2 weeks - please call our office.   Health Maintenance  Topic Date Due   Fecal DNA (Cologuard)  03/30/2023   DTaP/Tdap/Td (3 - Td or Tdap) 07/17/2023   Medicare Annual Wellness (AWV)  05/01/2024   COVID-19 Vaccine (8 - Pfizer risk 2024-25 season) 05/06/2024   Influenza Vaccine  06/09/2024 (Originally 04/05/2024)   Mammogram  01/09/2025   Pneumococcal Vaccine: 50+ Years  Completed   DEXA SCAN  Completed   Hepatitis C Screening  Completed   Zoster Vaccines- Shingrix  Completed   HPV VACCINES  Aged Out   Meningococcal B Vaccine  Aged Out    -See a dentist at least yearly  -Get your eyes checked and then per your eye specialist's recommendations  -Other issues addressed today:   -I have included below further information regarding a healthy whole foods based diet, physical activity guidelines for adults, stress management and opportunities for social connections. I hope you find this information useful.    -----------------------------------------------------------------------------------------------------------------------------------------------------------------------------------------------------------------------------------------------------------    NUTRITION: -eat real food: lots of colorful vegetables (half the plate) and fruits -5-7 servings of vegetables and fruits per day (fresh or steamed is best), exp. 2 servings of vegetables with lunch and dinner and 2 servings of fruit per day. Berries and greens such as kale and collards are great choices.  -consume on a regular basis:  fresh fruits, fresh veggies, fish, nuts, seeds, healthy oils (such as olive oil, avocado oil), whole grains (make sure for bread/pasta/crackers/etc., that the first ingredient on label contains the word whole), legumes. -can eat small amounts of dairy and lean meat (no larger than the palm of your hand), but avoid processed meats such as ham, bacon, lunch meat, etc. -drink water  -try to avoid fast food and pre-packaged foods, processed meat, ultra processed foods/beverages (donuts, candy, etc.) -most experts advise limiting sodium to < 2300mg  per day, should limit further is any chronic conditions such as high blood pressure, heart disease, diabetes, etc. The American Heart Association advised that < 1500mg  is is ideal -try to avoid foods/beverages that contain any ingredients with names you do not recognize  -try to avoid foods/beverages  with added sugar or sweeteners/sweets  -try to avoid sweet drinks (including diet drinks): soda, juice, Gatorade, sweet tea, power drinks, diet drinks -try to avoid white rice, white bread, pasta (unless whole grain)  EXERCISE GUIDELINES FOR ADULTS: -if you wish to increase your physical activity, do so gradually and with the approval of your doctor -STOP and seek medical care immediately if you have any chest pain, chest discomfort or trouble breathing when starting or  increasing exercise  -move and stretch your body, legs, feet and  arms when sitting for long periods -Physical activity guidelines for optimal health in adults: -get at least 150 minutes per week of moderate exercise (can talk, but not sing); this is about 20-30 minutes of sustained activity 5-7 days per week or two 10-15 minute episodes of sustained activity 5-7 days per week -do some muscle building/resistance training/strength training at least 2 days per week  -balance exercises 3+ days per week:   Stand somewhere where you have something sturdy to hold onto if you lose balance    1) lift up on toes, then back down, start with 5x per day and work up to 20x   2) stand and lift one leg straight out to the side so that foot is a few inches of the floor, start with 5x each side and work up to 20x each side   3) stand on one foot, start with 5 seconds each side and work up to 20 seconds on each side  If you need ideas or help with getting more active:  -Silver sneakers https://tools.silversneakers.com  -Walk with a Doc: http://www.duncan-williams.com/  -try to include resistance (weight lifting/strength building) and balance exercises twice per week: or the following link for ideas: http://castillo-powell.com/  BuyDucts.dk  STRESS MANAGEMENT: -can try meditating, or just sitting quietly with deep breathing while intentionally relaxing all parts of your body for 5 minutes daily -if you need further help with stress, anxiety or depression please follow up with your primary doctor or contact the wonderful folks at [facility]: [phone]  SOCIAL CONNECTIONS: -options in [place] if you wish to engage in more social and exercise related activities:  -Silver sneakers https://tools.silversneakers.com  -Walk with a Doc: http://www.duncan-williams.com/  -Check out the [facility] Active Adults 50+  section on the [place] of [employer] (hiking clubs, book clubs, cards and games, chess, exercise classes, aquatic classes and much more) - see the website for details: https://www.[place].gov/departments/parks-recreation/active-adults50  -YouTube has lots of exercise videos for different ages and abilities as well  -[facility] (a variety of indoor and outdoor inperson activities for adults). [phone]. [address]  -Virtual Online Classes (a variety of topics): see seniorplanet.org or call [phone]  -consider volunteering at a school, hospice center, church, senior center or elsewhere

## 2024-06-07 ENCOUNTER — Telehealth (HOSPITAL_COMMUNITY): Payer: Self-pay | Admitting: *Deleted

## 2024-06-07 NOTE — Telephone Encounter (Signed)
 Called to confirm/remind patient of their appointment at the [facility] on        Appointment:              [] Confirmed             [x] Left mess              [] No answer/No voice mail             [] Phone not in service   Patient reminded to bring all medications and/or complete list.   Confirmed patient has transportation. Gave directions, instructed to utilize valet parking. 06/10/24:

## 2024-06-10 ENCOUNTER — Encounter (HOSPITAL_COMMUNITY): Payer: Self-pay

## 2024-06-10 ENCOUNTER — Ambulatory Visit (HOSPITAL_BASED_OUTPATIENT_CLINIC_OR_DEPARTMENT_OTHER)
Admission: RE | Admit: 2024-06-10 | Discharge: 2024-06-10 | Disposition: A | Discharge status: Home / Self Care | Source: Ambulatory Visit | Attending: *Deleted | Admitting: *Deleted

## 2024-06-10 ENCOUNTER — Ambulatory Visit (HOSPITAL_COMMUNITY)
Admission: RE | Admit: 2024-06-10 | Discharge: 2024-06-10 | Disposition: A | Source: Ambulatory Visit | Attending: *Deleted | Admitting: *Deleted

## 2024-06-10 VITALS — BP 124/74 | HR 51 | Wt 225.6 lb

## 2024-06-10 DIAGNOSIS — Z8249 Family history of ischemic heart disease and other diseases of the circulatory system: Secondary | ICD-10-CM | POA: Insufficient documentation

## 2024-06-10 DIAGNOSIS — I5032 Chronic diastolic (congestive) heart failure: Secondary | ICD-10-CM | POA: Insufficient documentation

## 2024-06-10 DIAGNOSIS — I272 Pulmonary hypertension, unspecified: Secondary | ICD-10-CM | POA: Insufficient documentation

## 2024-06-10 DIAGNOSIS — E662 Morbid (severe) obesity with alveolar hypoventilation: Secondary | ICD-10-CM | POA: Diagnosis not present

## 2024-06-10 DIAGNOSIS — J9610 Chronic respiratory failure, unspecified whether with hypoxia or hypercapnia: Secondary | ICD-10-CM | POA: Diagnosis not present

## 2024-06-10 DIAGNOSIS — I482 Chronic atrial fibrillation, unspecified: Secondary | ICD-10-CM | POA: Diagnosis not present

## 2024-06-10 DIAGNOSIS — Z8673 Personal history of transient ischemic attack (TIA), and cerebral infarction without residual deficits: Secondary | ICD-10-CM | POA: Diagnosis not present

## 2024-06-10 DIAGNOSIS — Z6841 Body Mass Index (BMI) 40.0 and over, adult: Secondary | ICD-10-CM | POA: Diagnosis not present

## 2024-06-10 DIAGNOSIS — Z79899 Other long term (current) drug therapy: Secondary | ICD-10-CM | POA: Diagnosis not present

## 2024-06-10 DIAGNOSIS — I2721 Secondary pulmonary arterial hypertension: Secondary | ICD-10-CM | POA: Insufficient documentation

## 2024-06-10 DIAGNOSIS — Z823 Family history of stroke: Secondary | ICD-10-CM | POA: Insufficient documentation

## 2024-06-10 DIAGNOSIS — Z7901 Long term (current) use of anticoagulants: Secondary | ICD-10-CM | POA: Diagnosis not present

## 2024-06-10 DIAGNOSIS — J9612 Chronic respiratory failure with hypercapnia: Secondary | ICD-10-CM | POA: Diagnosis not present

## 2024-06-10 DIAGNOSIS — I110 Hypertensive heart disease with heart failure: Secondary | ICD-10-CM | POA: Insufficient documentation

## 2024-06-10 DIAGNOSIS — J9611 Chronic respiratory failure with hypoxia: Secondary | ICD-10-CM | POA: Diagnosis not present

## 2024-06-10 DIAGNOSIS — I10 Essential (primary) hypertension: Secondary | ICD-10-CM

## 2024-06-10 DIAGNOSIS — M1710 Unilateral primary osteoarthritis, unspecified knee: Secondary | ICD-10-CM | POA: Insufficient documentation

## 2024-06-10 LAB — ECHOCARDIOGRAM COMPLETE
Area-P 1/2: 2.62 cm2
Calc EF: 67.4 %
Single Plane A2C EF: 66 %
Single Plane A4C EF: 68.9 %

## 2024-06-10 NOTE — Progress Notes (Signed)
  Echocardiogram 2D Echocardiogram has been performed.  Devora Ellouise SAUNDERS 06/10/2024, 1:56 PM

## 2024-06-10 NOTE — Patient Instructions (Signed)
   Follow-Up in: 6 months with Dr. Rolan PLEASE CALL OUR OFFICE AROUND FEBRUARY  TO GET SCHEDULED FOR YOUR APPOINTMENT. PHONE NUMBER IS [phone] OPTION 2   At the [facility], you and your health needs are our priority. We have a designated team specialized in the treatment of Heart Failure. This Care Team includes your primary Heart Failure Specialized Cardiologist (physician), Advanced Practice Providers (APPs- Physician Assistants and Nurse Practitioners), and Pharmacist who all work together to provide you with the care you need, when you need it.   You may see any of the following providers on your designated Care Team at your next follow up:  Dr. Toribio Fuel Dr. Ezra Rolan Dr. Ria Commander Dr. Odis Brownie Greig Mosses, NP Caffie Shed, [place] [facility] [place], [place] Beckey Coe, NP [place] Lee, NP Tinnie Redman, PharmD   Please be sure to bring in all your medications bottles to every appointment.   Need to Contact Us :  If you have any questions or concerns before your next appointment please send us  a message through [place] or call our office at [phone].    TO LEAVE A MESSAGE FOR THE NURSE SELECT OPTION 2, PLEASE LEAVE A MESSAGE INCLUDING: YOUR NAME DATE OF BIRTH CALL BACK NUMBER REASON FOR CALL**this is important as we prioritize the call backs  YOU WILL RECEIVE A CALL BACK THE SAME DAY AS LONG AS YOU CALL BEFORE 4:00 PM

## 2024-06-10 NOTE — Progress Notes (Signed)
 Date:  06/10/2024   ID:  [name] Jan 23, 1951, MRN [id number]   Provider location: [facility] Type of Visit: Established patient   PCP:  Charlett Apolinar POUR, MD  Cardiologist:  Dr. Rolan  HPI: [name] is a 73 y.o. 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   Admitted 3/2 ->11/17/16 with A/C diastolic CHF and A/C respiratory failure. Pt initially refused Bipap so venti mask used. Eventually tolerated transition to BiPAP. Overall pt diuresed from 285 lbs down to 229 lbs with 38 L of diuresis.  Echo 11/07/16 EF 55-60%, PASP 90mm Hg, mildly dilated RV with moderate to severely decreased RV systolic function. RHC/LHC in 3/18 showed no coronary disease and confirmed severe PH.    Echo in 7/20 showed EF 60-65%, mild LVH, normal RV, PASP 38 mmHg, mild AS. Echo in 8/21 showed EF 60-65%, mildly decreased RV function, severe biatrial enlargement, small PFO, unable to estimate PA systolic pressure.   Echo 1/23, EF 60-65%, mild LVH, normal RV, mild RV enlargement, severe biatrial enlargement, mild MR, no significant aortic stenosis.   Echo 10/24 EF 60-65%, normal RV, severe biatrial enlargement, mild MR, suspect PFO, PASP 27 mmHg.   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 She had umbilical hernia repair in 3/25.    Today she returns for HF follow up. Overall feeling fine. She wears oxygen  PRN. She has SOB walking further distances on flat ground. Working out at [employer] 2-3x/week, physically limited by knee OA. Denies palpitations, abnormal bleeding, CP, dizziness, edema, or PND/Orthopnea. Appetite ok. Weight at home 227 pounds. Taking all medications. No longer using oxygen . She uses her CPAP regularly.   Echo today 06/10/24, EF 65-70%, normal RV.   ECG (personally reviewed): none  ordered today  Labs (5/23): K 5.1, creatinine 1.28 Labs (9/24): K 3.6, creatinine 1.37, LDL 63, TGs 100, TSH normal Labs (11/24): K 3.9, creatinine 1.15 Labs (2/25): BNP 219 Labs (3/25): hgb 14.3, K 3.9, creatinine 1.26 Labs (8/25): K 4.6, creatinine 1.31   PMH 1. 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 - RHC/LHC 11/07/16: No angiographic CAD; PA 90/55, LVEDP 23 (PCWP inaccurate), CI 2.15 Fick/2.32 Thermo, shunt run negative, PVR 5.7 WU.  - Echo (7/20): EF 60-65%, mild LVH, normal RV, PASP 38 mmHg, mild AS.  - Echo (8/21): EF 60-65%, mildly decreased RV function, severe biatrial enlargement, small PFO, unable to estimate PA systolic pressure. - Echo (1/23): EF 60-65%, mild LVH, normal RV, mild RV enlargement, severe biatrial enlargement, mild MR, no significant aortic stenosis.  - Echo (10/24): EF 60-65%, normal RV, severe biatrial enlargement, mild MR, suspect PFO, PASP 27 mmHg.  - Echo (10/25): EF 65-70%, normal RV.  2. Chronic hypercarbic/hypoxic respiratory failure with OHS/OSA. Sees Dr. McQuaid. Using nightly BiPAP and oxygen  by nasal cannula during the day.  3. Atrial fibrillation: Chronic - Rate controlled on Xarelto  + atenolol   4. 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  PVR 5.7 WU on RHC 3/18.  Suspect group 2 (elevated LA pressure) and group 3 (OHS/OSA) PH. However, cannot rule out group 1 component.  She had V/Q scan that was not suggestive  of chronic PE and high resolution chest CT that was not suggestive of ILD.  ANA, RF, anti-SCL70 all negative.   5. H/o CVA 6. Hypothyroidism 7. H/o TKR 8. Aortic stenosis: Mild on 7/20 echo. No evidence for significant stenosis on 1/23 echo.   Current Outpatient Medications  Medication Sig Dispense Refill   acetaminophen  (TYLENOL ) 325 MG  tablet Take 650 mg by mouth every 6 (six) hours as needed for moderate pain (pain score 4-6).     acetaZOLAMIDE  (DIAMOX ) 250 MG tablet TAKE 1 TABLET (250 MG TOTAL) BY MOUTH DAILY. NEEDS FOLLOW UP APPOINTMENT FOR MORE REFILLS 90 tablet 1   ADCIRCA  20 MG tablet TAKE 2 TABLETS BY MOUTH 1 TIME A DAY. 60 tablet 11   atenolol  (TENORMIN ) 25 MG tablet Take 1 tablet (25 mg total) by mouth daily. 90 tablet 3   carboxymethylcellulose (REFRESH PLUS) 0.5 % SOLN Place 1 drop into both eyes 3 (three) times daily as needed (dry eyes).     Cholecalciferol  (VITAMIN D ) 50 MCG (2000 UT) tablet Take 2,000 Units by mouth 3 (three) times a week.     Cyanocobalamin  (B-12) 5000 MCG CAPS Take 5,000 Units by mouth 2 (two) times a week.     empagliflozin  (JARDIANCE ) 10 MG TABS tablet Take 1 tablet (10 mg total) by mouth daily. 30 tablet 11   Ferrous Sulfate (SLOW FE PO) Take 1 tablet by mouth 2 (two) times a week.     furosemide  (LASIX ) 20 MG tablet TAKE 2 TABLETS BY MOUTH 2 TIMES DAILY. 360 tablet 3   levothyroxine  (SYNTHROID ) 75 MCG tablet Take 1 tablet (75 mcg total) by mouth daily. 90 tablet 1   NON FORMULARY Pt uses a c-pap nightly     potassium chloride  (KLOR-CON ) 10 MEQ tablet TAKE 2 TABLETS BY MOUTH 2 TIMES DAILY. 360 tablet 3   rivaroxaban  (XARELTO ) 20 MG TABS tablet Take 1 tablet (20 mg total) by mouth daily with supper. 90 tablet 1   rosuvastatin  (CRESTOR ) 5 MG tablet TAKE 1 TABLET (5 MG TOTAL) BY MOUTH DAILY. 90 tablet 3   semaglutide -weight management (WEGOVY ) 0.5 MG/0.5ML SOAJ SQ injection Inject 0.5 mg into the skin once a week. 2 mL 0   traMADol  (ULTRAM ) 50 MG tablet TAKE 1 TABLET BY MOUTH 3 TIMES DAILY AS NEEDED. 15 tablet 1   No current facility-administered medications for this encounter.   Allergies:   Ambien [zolpidem tartrate], Losartan , Prevacid [lansoprazole], Adhesive [tape], Keflex  [cephalexin ], Motrin [ibuprofen], Prilosec [omeprazole], Oxycodone , Ace inhibitors, Benadryl [diphenhydramine hcl],  and Spironolactone    Social History:  The patient  reports that she has never smoked. She has never used smokeless tobacco. She reports current alcohol  use of about 1.0 standard drink of alcohol  per week. She reports that she does not use drugs.   Family History:  The patient's family history includes COPD in her mother; Diabetes in her father; Esophageal cancer in her cousin; Heart attack in her father; Hypertension in her brother, father, mother, and sister; Liver cancer in her father; Osteoporosis in her mother; Stroke in her maternal grandmother.   ROS:  Please see the history of present illness.   All other systems are personally reviewed and negative.   Wt Readings from Last 3 Encounters:  06/10/24 102.3 kg (225 lb 9.6 oz)  05/14/24 104.3 kg (230 lb)  05/07/24 104.8 kg (231 lb)   BP 124/74   Pulse (!) 51   Wt 102.3 kg (225 lb 9.6 oz)   LMP 05/06/2013  SpO2 94%   BMI 44.06 kg/m   PHYSICAL EXAM: General:  NAD. No resp difficulty, walked into clinic with RW HEENT: Normal Neck: Supple. No JVD. Thick neck Cor: Brady irregular rate & rhythm. No rubs, gallops or murmurs. Lungs: Clear, diminished in lower lobes Abdomen: Soft, obese, nontender, nondistended.  Extremities: No cyanosis, clubbing, rash, edema Neuro: Alert & oriented x 3, moves all 4 extremities w/o difficulty. Affect pleasant.  ASSESSMENT AND PLAN: 1. Chronic diastolic CHF:  Associated with severe pulmonary hypertension and prominent RV failure.  Echo 10/24 showed EF 60-65%, normal RV, severe biatrial enlargement, mild MR, suspect PFO, PASP 27 mmHg. Echo today 06/10/24, EF 65-70%, normal RV. NYHA class II-early III, functional class confounded by body habitus and knee OA. She is not significantly volume overloaded.  - Continue Lasix  40 mg bid + acetazolamide  250 mg daily. Recent labs reviewed and are stable, K 4.6, SCt 1.31 - Continue Jardiance  10 mg daily. No GU symptoms.  - She was unable to get insurance coverage for  finerenone .  2. Chronic hypercarbic/hypoxic respiratory failure with OHS/OSA:  Using CPAP at night. She is not using oxygen  during the day at this point, has lost weight. - She is on acetazolamide  still.  3. Atrial fibrillation: Chronic, now running bradycardic (asymptomatic). - Continue atenolol  25 mg daily.  Can stop altogether if HR remains low.     - Continue Xarelto  for anticoagulation. No bleeding issues. 4. 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  PVR 5.7 WU by RHC in 3/18.  Suspect group 2 (elevated LA pressure) and group 3 (OHS/OSA) PH. However, cannot rule out group 1 component.  She had V/Q scan that was not suggestive of chronic PE and high resolution chest CT that was not suggestive of ILD.  ANA, RF, anti-SCL70 all negative.  She did not have a marked improvement from taking Adcirca  => suspect predominantly group 2 and 3 PH.  Will hold off on additional pulmonary vasodilators. Echo 1/23 showed with mildly enlarged RV with normal systolic function, unable to estimate PA systolic pressure. Echo 10/24 showed normal RV with PASP estimated 27 mmHg (improved). Echo today 06/10/24, EF 65-70%, normal RV with RVSP 45.7 mmHg.  - Continue Lasix  as above.  - Continue Adcirca  40 mg daily.  - Continue CPAP at night for OSA, no longer requiring oxygen  during the day.  5. HTN: BP well controlled - Continue meds as above 6. Obesity: Body mass index is 44.06 kg/m. - She is now on semaglutide .  Follow up in 6 months with Dr. Rolan  Signed, Harlene CHRISTELLA Gainer, FNP  06/10/2024  [facility] [address] [facility] [place] [address] [phone] (office) [phone] (fax)

## 2024-06-18 ENCOUNTER — Other Ambulatory Visit (HOSPITAL_BASED_OUTPATIENT_CLINIC_OR_DEPARTMENT_OTHER): Payer: Self-pay

## 2024-06-18 MED ORDER — WEGOVY 1 MG/0.5ML ~~LOC~~ SOAJ
1.0000 mg | SUBCUTANEOUS | 0 refills | Status: DC
  Filled 2024-06-18: qty 2, 28d supply, fill #0

## 2024-06-24 DIAGNOSIS — Z1211 Encounter for screening for malignant neoplasm of colon: Secondary | ICD-10-CM | POA: Diagnosis not present

## 2024-06-25 DIAGNOSIS — D485 Neoplasm of uncertain behavior of skin: Secondary | ICD-10-CM | POA: Diagnosis not present

## 2024-06-25 DIAGNOSIS — C44329 Squamous cell carcinoma of skin of other parts of face: Secondary | ICD-10-CM | POA: Diagnosis not present

## 2024-06-25 DIAGNOSIS — L719 Rosacea, unspecified: Secondary | ICD-10-CM | POA: Diagnosis not present

## 2024-06-25 DIAGNOSIS — L570 Actinic keratosis: Secondary | ICD-10-CM | POA: Diagnosis not present

## 2024-06-27 LAB — COLOGUARD: COLOGUARD: NEGATIVE

## 2024-07-01 ENCOUNTER — Other Ambulatory Visit (HOSPITAL_COMMUNITY): Payer: Self-pay | Admitting: Family Medicine

## 2024-07-02 ENCOUNTER — Ambulatory Visit: Payer: Self-pay | Admitting: Family Medicine

## 2024-07-02 DIAGNOSIS — C44329 Squamous cell carcinoma of skin of other parts of face: Secondary | ICD-10-CM | POA: Diagnosis not present

## 2024-07-03 ENCOUNTER — Telehealth: Payer: Self-pay | Admitting: Adult Health

## 2024-07-03 DIAGNOSIS — G4733 Obstructive sleep apnea (adult) (pediatric): Secondary | ICD-10-CM | POA: Diagnosis not present

## 2024-07-03 DIAGNOSIS — J9610 Chronic respiratory failure, unspecified whether with hypoxia or hypercapnia: Secondary | ICD-10-CM | POA: Diagnosis not present

## 2024-07-03 NOTE — Telephone Encounter (Signed)
 PCC's- can you please check and see why they are requesting the notes? They are visible in Epic. Thanks!

## 2024-07-03 NOTE — Telephone Encounter (Signed)
 Brad with [facility] will be contacting us  in regards to this call from [place].

## 2024-07-03 NOTE — Telephone Encounter (Signed)
 Copied from CRM [id number]. Topic: Clinical - Prescription Issue >> Jul 03, 2024  2:32 PM Corean SAUNDERS wrote: Reason for CRM: Rosina with [facility] is requesting documents with updated prescription with CPAP settings and use of benefits notes. Please fax this to: [phone]

## 2024-07-05 ENCOUNTER — Other Ambulatory Visit: Payer: Self-pay | Admitting: Internal Medicine

## 2024-07-18 ENCOUNTER — Telehealth (HOSPITAL_BASED_OUTPATIENT_CLINIC_OR_DEPARTMENT_OTHER): Payer: Self-pay

## 2024-07-18 NOTE — Telephone Encounter (Signed)
 Updated chart notes sent to [facility] @ [phone] confirmation recevied

## 2024-07-19 ENCOUNTER — Other Ambulatory Visit (HOSPITAL_COMMUNITY): Payer: Self-pay

## 2024-07-22 ENCOUNTER — Telehealth (HOSPITAL_COMMUNITY): Payer: Self-pay

## 2024-07-22 DIAGNOSIS — Z961 Presence of intraocular lens: Secondary | ICD-10-CM | POA: Diagnosis not present

## 2024-07-22 DIAGNOSIS — H26493 Other secondary cataract, bilateral: Secondary | ICD-10-CM | POA: Diagnosis not present

## 2024-07-22 NOTE — Telephone Encounter (Signed)
 [facility] Patient Advocate Encounter  Application for Jardiance  faxed to [facility] on 07/22/2024. Application form attached to patient chart.  Rachel DEL, CPhT Rx Patient Advocate Phone: [phone]

## 2024-07-25 ENCOUNTER — Telehealth: Payer: Self-pay

## 2024-07-25 ENCOUNTER — Other Ambulatory Visit (HOSPITAL_BASED_OUTPATIENT_CLINIC_OR_DEPARTMENT_OTHER): Payer: Self-pay

## 2024-07-25 ENCOUNTER — Other Ambulatory Visit: Payer: Self-pay | Admitting: Pharmacist

## 2024-07-25 MED ORDER — WEGOVY 1 MG/0.5ML ~~LOC~~ SOAJ
1.0000 mg | SUBCUTANEOUS | 0 refills | Status: DC
  Filled 2024-07-25: qty 2, 28d supply, fill #0

## 2024-07-25 NOTE — Telephone Encounter (Signed)
 Copied from CRM [id number]. Topic: Clinical - Prescription Issue >> Jul 03, 2024  2:32 PM Corean SAUNDERS wrote: Reason for CRM: [name] with [facility] is requesting documents with updated prescription with CPAP settings and use of benefits notes. Please fax this to: [phone] >> Jul 25, 2024  9:25 AM Russell PARAS wrote: Pt is contacting clinic regarding order for her CPAP that was sent to [facility]. They are requiring more information, including pressure settings, to be able to ship out machine. Request from [facility] was sent over on 11/17.   Pt requested call back or message in MyChart, advising that the add info has been sent over  CB#  [phone]  This was handled. NFN

## 2024-07-31 NOTE — Telephone Encounter (Signed)
 Patient was approved to receive Jardiance  from [facility] Effective 07/30/2024 to 09/04/2025  Patient informed by phone

## 2024-08-05 ENCOUNTER — Other Ambulatory Visit (HOSPITAL_COMMUNITY): Payer: Self-pay | Admitting: Cardiology

## 2024-08-07 ENCOUNTER — Telehealth: Payer: Self-pay

## 2024-08-07 NOTE — Telephone Encounter (Signed)
 My note already says this in the Assessment and Plan  Obstructive sleep apnea and obesity hypoventilation syndrome  -excellent control and compliance on CPAP. Has perceived benefit. . She demonstrates excellent compliance, using CPAP every night for about eight hours.   The last setting I have her on was auto CPAP 5-15cmH2o.  Please print CPAP download and I will look at when I return and place order. Please let [name] know I will call her on  Monday to discuss the order

## 2024-08-07 NOTE — Telephone Encounter (Signed)
 Copied from CRM #[id number]. Topic: Clinical - Order For Equipment >> Aug 06, 2024 10:18 AM Ismael A wrote: Reason for CRM: Pt 3x calling in to confirm that cpap RX has been sent to [facility], they are requesting a detailed RX listing CPAP settings and use of benefits notes -  she states she spoke with them and they have not received anything yet and [facility] stated they are about to cancel her order if RX is not received - Please fax this to: [phone]  - pt is requesting a call back to confirm that this has been sent over ph# [phone]   Pt states [facility] is going to send her a new machine however, they did not receive an order for the proper settings and information. Pt needs our office to urgently fax over an order to [facility].   Tammy, what settings are you wanting the pt to be on for CPAP?

## 2024-08-07 NOTE — Telephone Encounter (Signed)
 Patient was seen on 05/2024 with my office notes :  Obstructive sleep apnea and obesity hypoventilation syndrome  -excellent control and compliance on CPAP. Has perceived benefit. . She demonstrates excellent compliance, using CPAP every night for about eight hours. CPAP download confirms 100% compliance with an average usage of eight hours per night.    What exactly is missing from the note that needs to be entered and what order is needed.  Can the [facility] or clinic team please investigate this issue.   Concern that DME is threatening to cancel necessary equipment for this patient .

## 2024-08-07 NOTE — Telephone Encounter (Signed)
 Can someone please follow up on this ASAP?  Thank you.

## 2024-08-07 NOTE — Telephone Encounter (Signed)
 Tammy is not back in the office until Monday December 8th.  What is it about her note that is not enough?  It literally says she is wearing it every night for about 8 hours and excellent compliance as well as excellent control of sleep apnea and obesity hypoventilation syndrome.  This should be proof of patient benefiting from therapy.  She is not going to be happy that they want her to add that she is benefiting from CPAP.  That is basically in her note, just not those exact words.

## 2024-08-07 NOTE — Telephone Encounter (Signed)
 I spoke with Brad at [facility] IF OV 05/14/24 can be updated saying that she is benefiting from the CPAP/ referral for new machine with settings we should be good. As soon as new referral is put in I will send to [facility] at [facility] and he is on the look out.

## 2024-08-08 ENCOUNTER — Other Ambulatory Visit: Payer: Self-pay | Admitting: *Deleted

## 2024-08-08 DIAGNOSIS — E662 Morbid (severe) obesity with alveolar hypoventilation: Secondary | ICD-10-CM

## 2024-08-08 DIAGNOSIS — G4733 Obstructive sleep apnea (adult) (pediatric): Secondary | ICD-10-CM

## 2024-08-08 NOTE — Telephone Encounter (Signed)
 [name] said the OV note should work we just need an updated referral and he will push it through.

## 2024-08-08 NOTE — Telephone Encounter (Signed)
 Order has been sent to [facility] as urgent

## 2024-08-08 NOTE — Telephone Encounter (Signed)
 Order placed and TP has signed it.

## 2024-08-08 NOTE — Telephone Encounter (Signed)
 Ok, thank you.  I will do that now.

## 2024-08-12 NOTE — Telephone Encounter (Signed)
 I called and spoke with patient, advised her that she should be hearing from [facility] so regarding her CPAP machine.  She verbalized understanding.  Nothing further needed.

## 2024-08-21 ENCOUNTER — Other Ambulatory Visit (HOSPITAL_BASED_OUTPATIENT_CLINIC_OR_DEPARTMENT_OTHER): Payer: Self-pay

## 2024-08-21 MED ORDER — WEGOVY 1.7 MG/0.75ML ~~LOC~~ SOAJ
1.7000 mg | SUBCUTANEOUS | 0 refills | Status: DC
  Filled 2024-08-21: qty 3, 28d supply, fill #0

## 2024-08-21 NOTE — Addendum Note (Signed)
 Addended by: Masey Scheiber D on: 08/21/2024 05:04 PM   Modules accepted: Orders

## 2024-08-27 ENCOUNTER — Telehealth: Payer: Self-pay

## 2024-08-27 NOTE — Telephone Encounter (Signed)
 Requested paperwork faxed to [facility]

## 2024-09-17 ENCOUNTER — Telehealth (HOSPITAL_COMMUNITY): Payer: Self-pay | Admitting: Cardiology

## 2024-09-17 NOTE — Telephone Encounter (Signed)
 Patient called to express interest in PO wegovy   Reports injectable will be close to $600 this next fill and would like change ASAP  Per Dr Rolan [facility] team review medication change and costs

## 2024-09-18 NOTE — Telephone Encounter (Signed)
 [facility] called to request additional information for PA request of wegovy  tablets  Please call [phone] opt [phone]

## 2024-09-19 ENCOUNTER — Other Ambulatory Visit (HOSPITAL_BASED_OUTPATIENT_CLINIC_OR_DEPARTMENT_OTHER): Payer: Self-pay

## 2024-09-19 MED ORDER — WEGOVY 2.4 MG/0.75ML ~~LOC~~ SOAJ
2.4000 mg | SUBCUTANEOUS | 11 refills | Status: DC
  Filled 2024-09-19: qty 3, 28d supply, fill #0

## 2024-09-19 NOTE — Addendum Note (Signed)
 Addended by: Jennings Stirling D on: 09/19/2024 01:29 PM   Modules accepted: Orders

## 2024-09-19 NOTE — Telephone Encounter (Signed)
 Patient decided to stick with injection

## 2024-09-20 ENCOUNTER — Other Ambulatory Visit (HOSPITAL_BASED_OUTPATIENT_CLINIC_OR_DEPARTMENT_OTHER): Payer: Self-pay

## 2024-09-23 NOTE — Telephone Encounter (Signed)
 I spoke with patient. She does not think she can afford the 2100 per year even if broken down over 12 months. She is going to stop therapy. She asked that I let her know if something changes in pricing.

## 2024-09-23 NOTE — Addendum Note (Signed)
 Addended by: Oakland Fant D on: 09/23/2024 04:21 PM   Modules accepted: Orders
